# Patient Record
Sex: Female | Born: 1935
Health system: Southern US, Community
[De-identification: ages and names within clinical notes are randomized; demographics above are authoritative.]

## PROBLEM LIST (undated history)

## (undated) DIAGNOSIS — I499 Cardiac arrhythmia, unspecified: Secondary | ICD-10-CM

## (undated) DIAGNOSIS — M1990 Unspecified osteoarthritis, unspecified site: Secondary | ICD-10-CM

## (undated) DIAGNOSIS — N816 Rectocele: Secondary | ICD-10-CM

## (undated) DIAGNOSIS — I739 Peripheral vascular disease, unspecified: Secondary | ICD-10-CM

## (undated) DIAGNOSIS — I4891 Unspecified atrial fibrillation: Secondary | ICD-10-CM

## (undated) DIAGNOSIS — I2510 Atherosclerotic heart disease of native coronary artery without angina pectoris: Secondary | ICD-10-CM

## (undated) DIAGNOSIS — F419 Anxiety disorder, unspecified: Secondary | ICD-10-CM

## (undated) DIAGNOSIS — K219 Gastro-esophageal reflux disease without esophagitis: Secondary | ICD-10-CM

## (undated) DIAGNOSIS — R0600 Dyspnea, unspecified: Secondary | ICD-10-CM

## (undated) DIAGNOSIS — Z87442 Personal history of urinary calculi: Secondary | ICD-10-CM

## (undated) DIAGNOSIS — I10 Essential (primary) hypertension: Secondary | ICD-10-CM

## (undated) DIAGNOSIS — E785 Hyperlipidemia, unspecified: Secondary | ICD-10-CM

## (undated) DIAGNOSIS — N189 Chronic kidney disease, unspecified: Secondary | ICD-10-CM

## (undated) DIAGNOSIS — R011 Cardiac murmur, unspecified: Secondary | ICD-10-CM

## (undated) DIAGNOSIS — N390 Urinary tract infection, site not specified: Secondary | ICD-10-CM

## (undated) HISTORY — DX: Urinary tract infection, site not specified: N39.0

## (undated) HISTORY — DX: Unspecified atrial fibrillation: I48.91

## (undated) HISTORY — PX: EYE SURGERY: SHX253

## (undated) HISTORY — DX: Essential (primary) hypertension: I10

## (undated) HISTORY — PX: ABDOMINAL AORTA STENT: SHX1108

## (undated) HISTORY — PX: JOINT REPLACEMENT: SHX530

## (undated) HISTORY — DX: Atherosclerotic heart disease of native coronary artery without angina pectoris: I25.10

## (undated) HISTORY — PX: CORONARY ANGIOPLASTY WITH STENT PLACEMENT: SHX49

## (undated) HISTORY — PX: RENAL ARTERY STENT: SHX2321

## (undated) HISTORY — PX: CARDIAC CATHETERIZATION: SHX172

## (undated) HISTORY — PX: BREAST BIOPSY: SHX20

## (undated) HISTORY — PX: BREAST EXCISIONAL BIOPSY: SUR124

## (undated) HISTORY — DX: Hyperlipidemia, unspecified: E78.5

## (undated) HISTORY — PX: CORONARY ARTERY BYPASS GRAFT: SHX141

## (undated) HISTORY — PX: TONSILLECTOMY: SUR1361

## (undated) HISTORY — DX: Peripheral vascular disease, unspecified: I73.9

## (undated) HISTORY — PX: APPENDECTOMY: SHX54

---

## 1993-09-17 HISTORY — PX: CORONARY ARTERY BYPASS GRAFT: SHX141

## 1998-11-07 ENCOUNTER — Inpatient Hospital Stay (HOSPITAL_COMMUNITY): Admission: EM | Admit: 1998-11-07 | Discharge: 1998-11-10 | Payer: Self-pay | Admitting: Emergency Medicine

## 2005-02-26 ENCOUNTER — Ambulatory Visit: Payer: Self-pay | Admitting: Internal Medicine

## 2005-04-30 ENCOUNTER — Ambulatory Visit: Payer: Self-pay | Admitting: Internal Medicine

## 2005-06-26 ENCOUNTER — Ambulatory Visit: Payer: Self-pay | Admitting: Vascular Surgery

## 2006-04-26 ENCOUNTER — Ambulatory Visit: Payer: Self-pay | Admitting: Family Medicine

## 2006-08-29 ENCOUNTER — Ambulatory Visit: Payer: Self-pay

## 2006-09-03 ENCOUNTER — Ambulatory Visit: Payer: Self-pay

## 2006-09-17 HISTORY — PX: BREAST BIOPSY: SHX20

## 2006-10-02 ENCOUNTER — Ambulatory Visit: Payer: Self-pay | Admitting: Unknown Physician Specialty

## 2006-12-09 ENCOUNTER — Ambulatory Visit: Payer: Self-pay | Admitting: Surgery

## 2006-12-31 ENCOUNTER — Ambulatory Visit: Payer: Self-pay | Admitting: Surgery

## 2006-12-31 ENCOUNTER — Other Ambulatory Visit: Payer: Self-pay

## 2007-01-07 ENCOUNTER — Ambulatory Visit: Payer: Self-pay | Admitting: Surgery

## 2007-04-28 ENCOUNTER — Ambulatory Visit: Payer: Self-pay | Admitting: Internal Medicine

## 2007-12-03 ENCOUNTER — Ambulatory Visit: Payer: Self-pay

## 2008-11-16 ENCOUNTER — Ambulatory Visit: Payer: Self-pay | Admitting: Internal Medicine

## 2009-02-23 ENCOUNTER — Ambulatory Visit: Payer: Self-pay

## 2010-04-26 ENCOUNTER — Ambulatory Visit: Payer: Self-pay | Admitting: Unknown Physician Specialty

## 2010-10-19 ENCOUNTER — Ambulatory Visit: Payer: Self-pay | Admitting: Obstetrics and Gynecology

## 2011-10-07 ENCOUNTER — Inpatient Hospital Stay: Payer: Self-pay | Admitting: Internal Medicine

## 2011-10-07 LAB — CBC
MCH: 30.1 pg (ref 26.0–34.0)
MCHC: 33.4 g/dL (ref 32.0–36.0)
Platelet: 164 10*3/uL (ref 150–440)
RDW: 12.4 % (ref 11.5–14.5)
WBC: 5.8 10*3/uL (ref 3.6–11.0)

## 2011-10-07 LAB — COMPREHENSIVE METABOLIC PANEL
Alkaline Phosphatase: 46 U/L — ABNORMAL LOW (ref 50–136)
Anion Gap: 11 (ref 7–16)
BUN: 26 mg/dL — ABNORMAL HIGH (ref 7–18)
Calcium, Total: 10.1 mg/dL (ref 8.5–10.1)
Chloride: 104 mmol/L (ref 98–107)
EGFR (African American): >60
Glucose: 109 mg/dL — ABNORMAL HIGH (ref 65–99)
Osmolality: 288 (ref 275–301)
Potassium: 4.1 mmol/L (ref 3.5–5.1)
SGOT(AST): 26 U/L (ref 15–37)
SGPT (ALT): 26 U/L
Sodium: 142 mmol/L (ref 136–145)
Total Protein: 7.6 g/dL (ref 6.4–8.2)

## 2011-10-07 LAB — PROTIME-INR: Prothrombin Time: 12.3 secs (ref 11.5–14.7)

## 2011-10-07 LAB — CK TOTAL AND CKMB (NOT AT ARMC)
CK, Total: 42 U/L (ref 21–215)
CK, Total: 35 U/L (ref 21–215)

## 2011-10-07 LAB — TROPONIN I
Troponin-I: <0.02 ng/mL
Troponin-I: <0.02 ng/mL

## 2011-10-07 LAB — MAGNESIUM: Magnesium: 1.7 mg/dL — ABNORMAL LOW

## 2011-10-08 LAB — TROPONIN I: Troponin-I: <0.02 ng/mL

## 2011-10-08 LAB — CBC WITH DIFFERENTIAL/PLATELET
Basophil #: 0 10*3/uL (ref 0.0–0.1)
Basophil %: 0.3 %
Eosinophil #: 0.1 10*3/uL (ref 0.0–0.7)
Eosinophil %: 1.7 %
HCT: 33.2 % — ABNORMAL LOW (ref 35.0–47.0)
HGB: 11.3 g/dL — ABNORMAL LOW (ref 12.0–16.0)
Lymphocyte #: 2 10*3/uL (ref 1.0–3.6)
Lymphocyte %: 36.5 %
MCHC: 34 g/dL (ref 32.0–36.0)
MCV: 90 fL (ref 80–100)
Monocyte %: 8 %
Neutrophil #: 2.9 10*3/uL (ref 1.4–6.5)
RBC: 3.7 10*6/uL — ABNORMAL LOW (ref 3.80–5.20)

## 2011-10-08 LAB — LIPID PANEL
Cholesterol: 141 mg/dL (ref 0–200)
Ldl Cholesterol, Calc: 68 mg/dL (ref 0–100)
VLDL Cholesterol, Calc: 36 mg/dL (ref 5–40)

## 2011-10-08 LAB — BASIC METABOLIC PANEL
BUN: 20 mg/dL — ABNORMAL HIGH (ref 7–18)
Calcium, Total: 9.1 mg/dL (ref 8.5–10.1)
EGFR (African American): >60
EGFR (Non-African Amer.): >60
Glucose: 111 mg/dL — ABNORMAL HIGH (ref 65–99)
Osmolality: 290 (ref 275–301)
Potassium: 4.4 mmol/L (ref 3.5–5.1)

## 2011-10-09 LAB — BASIC METABOLIC PANEL
Anion Gap: 12 (ref 7–16)
Calcium, Total: 8.7 mg/dL (ref 8.5–10.1)
Co2: 27 mmol/L (ref 21–32)
EGFR (African American): >60
Glucose: 102 mg/dL — ABNORMAL HIGH (ref 65–99)
Osmolality: 281 (ref 275–301)
Potassium: 4 mmol/L (ref 3.5–5.1)

## 2011-10-09 LAB — CK TOTAL AND CKMB (NOT AT ARMC): CK-MB: 0.5 ng/mL (ref 0.5–3.6)

## 2011-12-31 ENCOUNTER — Institutional Professional Consult (permissible substitution): Payer: Self-pay | Admitting: Cardiovascular Disease

## 2012-01-10 ENCOUNTER — Encounter: Payer: Self-pay | Admitting: *Deleted

## 2012-01-14 ENCOUNTER — Encounter: Payer: Self-pay | Admitting: Cardiovascular Disease

## 2012-01-17 ENCOUNTER — Encounter: Payer: Self-pay | Admitting: *Deleted

## 2012-01-18 ENCOUNTER — Encounter: Payer: Self-pay | Admitting: *Deleted

## 2012-01-22 ENCOUNTER — Encounter: Payer: Self-pay | Admitting: Cardiovascular Disease

## 2012-01-22 ENCOUNTER — Ambulatory Visit (INDEPENDENT_AMBULATORY_CARE_PROVIDER_SITE_OTHER): Payer: Medicare Other | Admitting: Cardiovascular Disease

## 2012-01-22 VITALS — BP 145/82 | HR 55 | Ht 63.0 in | Wt 140.8 lb

## 2012-01-22 DIAGNOSIS — E1169 Type 2 diabetes mellitus with other specified complication: Secondary | ICD-10-CM | POA: Insufficient documentation

## 2012-01-22 DIAGNOSIS — I701 Atherosclerosis of renal artery: Secondary | ICD-10-CM | POA: Insufficient documentation

## 2012-01-22 DIAGNOSIS — E785 Hyperlipidemia, unspecified: Secondary | ICD-10-CM | POA: Insufficient documentation

## 2012-01-22 DIAGNOSIS — I259 Chronic ischemic heart disease, unspecified: Secondary | ICD-10-CM

## 2012-01-22 DIAGNOSIS — Z951 Presence of aortocoronary bypass graft: Secondary | ICD-10-CM

## 2012-01-22 DIAGNOSIS — E119 Type 2 diabetes mellitus without complications: Secondary | ICD-10-CM

## 2012-01-22 DIAGNOSIS — E1122 Type 2 diabetes mellitus with diabetic chronic kidney disease: Secondary | ICD-10-CM | POA: Insufficient documentation

## 2012-01-22 DIAGNOSIS — E782 Mixed hyperlipidemia: Secondary | ICD-10-CM | POA: Insufficient documentation

## 2012-01-22 DIAGNOSIS — I10 Essential (primary) hypertension: Secondary | ICD-10-CM | POA: Insufficient documentation

## 2012-01-22 DIAGNOSIS — N182 Chronic kidney disease, stage 2 (mild): Secondary | ICD-10-CM | POA: Insufficient documentation

## 2012-01-22 DIAGNOSIS — I2510 Atherosclerotic heart disease of native coronary artery without angina pectoris: Secondary | ICD-10-CM | POA: Insufficient documentation

## 2012-01-22 MED ORDER — NITROGLYCERIN 0.4 MG SL SUBL: 0.4000 mg | SUBLINGUAL_TABLET | SUBLINGUAL | Status: DC | PRN

## 2012-01-22 NOTE — Progress Notes (Signed)
Patient ID: Tracey [name], female    DOB: 1936/06/17, 76 y.o.   MRN: [id number]  HPI Comments: [name] is a very pleasant 76 year old woman who is new to our clinic with a history of coronary artery disease, bypass surgery in 1995, diabetes with most recent hemoglobin A1c 7.0 number stent to her graft to the OM several years ago with chest pain January 2013 and repeat stenting to the graft to the OM with a Xience 3.5 x 18 mm stent, also with left renal PCI in 1998, redo PCI March 2010 followed by Dr. Wyn Quaker, who presents to establish care.  She reports that she has had no further chest pain since her stent to the vein graft in January 2013. She is very active and takes care of a large family. She reports that on recent ultrasound of her renal artery, she had no significant restenosis.  Most recent total cholesterol 141, LDL 68. She is working on her diabetes trying to get hemoglobin A1c less than 7. She watches what she eats. She denies any significant carotid arterial disease.  EKG shows sinus bradycardia with rate 55 beats per minute with no significant ST or T wave changes  Details from her bypass surgery showed LIMA to the LAD, vein graft to the PDA and vein graft to the OM Details from the cardiac catheterization January 2013 show proximal LAD occlusion, proximal left circumflex occlusion, proximal RCA occlusion, graft to the OM with 90% disease proximally. DES stent was placed.  Echocardiogram dated January 2012 shows normal systolic function, mild MR and TR   Outpatient Encounter Prescriptions as of 01/22/2012  Medication Sig Dispense Refill  . aspirin 81 MG tablet Take 81 mg by mouth daily.      . Calcium Carbonate-Vit D-Min (CALTRATE 600+D PLUS PO) Take 200 Units by mouth daily.      . clopidogrel (PLAVIX) 75 MG tablet Take 75 mg by mouth daily.      Marland Kitchen lisinopril (PRINIVIL,ZESTRIL) 20 MG tablet Take 20 mg by mouth daily.      . metFORMIN (GLUCOPHAGE) 500 MG tablet Take 500 mg by mouth  2 (two) times daily with a meal.      . metoprolol tartrate (LOPRESSOR) 25 MG tablet Take 25 mg by mouth daily.      . Multiple Vitamins-Minerals (MULTIVITAMIN WITH MINERALS) tablet Take 1 tablet by mouth daily.      . rosuvastatin (CRESTOR) 20 MG tablet Take 20 mg by mouth daily.      . nitroGLYCERIN (NITROSTAT) 0.4 MG SL tablet Place 1 tablet (0.4 mg total) under the tongue every 5 (five) minutes as needed for chest pain.  25 tablet  6   Review of Systems  Constitutional: Negative.   HENT: Negative.   Eyes: Negative.   Respiratory: Negative.   Cardiovascular: Negative.   Gastrointestinal: Negative.   Musculoskeletal: Negative.   Skin: Negative.   Neurological: Negative.   Hematological: Negative.   Psychiatric/Behavioral: Negative.   All other systems reviewed and are negative.    BP 145/82  Pulse 55  Ht 5\' 3"  (1.6 m)  Wt 140 lb 12 oz (63.844 kg)  BMI 24.93 kg/m2  Physical Exam  Nursing note and vitals reviewed. Constitutional: She is oriented to person, place, and time. She appears well-developed and well-nourished.  HENT:  Head: Normocephalic.  Nose: Nose normal.  Mouth/Throat: Oropharynx is clear and moist.  Eyes: Conjunctivae are normal. Pupils are equal, round, and reactive to light.  Neck: Normal range of  motion. Neck supple. No JVD present.  Cardiovascular: Normal rate, regular rhythm, S1 normal, S2 normal, normal heart sounds and intact distal pulses.  Exam reveals no gallop and no friction rub.   No murmur heard. Pulmonary/Chest: Effort normal and breath sounds normal. No respiratory distress. She has no wheezes. She has no rales. She exhibits no tenderness.  Abdominal: Soft. Bowel sounds are normal. She exhibits no distension. There is no tenderness.  Musculoskeletal: Normal range of motion. She exhibits no edema and no tenderness.  Lymphadenopathy:    She has no cervical adenopathy.  Neurological: She is alert and oriented to person, place, and time.  Coordination normal.  Skin: Skin is warm and dry. No rash noted. No erythema.  Psychiatric: She has a normal mood and affect. Her behavior is normal. Judgment and thought content normal.         Assessment and Plan

## 2012-01-22 NOTE — Assessment & Plan Note (Signed)
Cholesterol is at goal on the current lipid regimen. No changes to the medications were made.  

## 2012-01-22 NOTE — Assessment & Plan Note (Signed)
Followed by Dr. Wyn Quaker. Previous PCI on the left.

## 2012-01-22 NOTE — Assessment & Plan Note (Signed)
Recent PCI of the vein graft to the OM. Currently with no symptoms. DES stent placed. Will continue Plavix and aspirin for at least one year extending to early 2014. No further testing at this time

## 2012-01-22 NOTE — Patient Instructions (Addendum)
You are doing well. Please increase aspirin to 81 mg x 2 daily with plavix  Please call us if you have new issues that need to be addressed before your next appt.  Your physician wants you to follow-up in: 6 months.  You will receive a reminder letter in the mail two months in advance. If you don't receive a letter, please call our office to schedule the follow-up appointment.

## 2012-01-22 NOTE — Assessment & Plan Note (Signed)
We have encouraged her to work on her cholesterol with goal hemoglobin A1c less than 6.5.

## 2012-01-22 NOTE — Assessment & Plan Note (Signed)
Blood pressure is well controlled on today's visit. No changes made to the medications. 

## 2012-07-10 ENCOUNTER — Ambulatory Visit: Payer: Self-pay | Admitting: Unknown Physician Specialty

## 2012-07-15 ENCOUNTER — Ambulatory Visit: Payer: Self-pay | Admitting: Family Medicine

## 2012-07-21 ENCOUNTER — Encounter: Payer: Self-pay | Admitting: Cardiovascular Disease

## 2012-07-21 ENCOUNTER — Ambulatory Visit (INDEPENDENT_AMBULATORY_CARE_PROVIDER_SITE_OTHER): Payer: Medicare Other | Admitting: Cardiovascular Disease

## 2012-07-21 VITALS — BP 128/58 | HR 56 | Ht 63.0 in | Wt 137.2 lb

## 2012-07-21 DIAGNOSIS — E119 Type 2 diabetes mellitus without complications: Secondary | ICD-10-CM

## 2012-07-21 DIAGNOSIS — I2510 Atherosclerotic heart disease of native coronary artery without angina pectoris: Secondary | ICD-10-CM

## 2012-07-21 DIAGNOSIS — I259 Chronic ischemic heart disease, unspecified: Secondary | ICD-10-CM

## 2012-07-21 DIAGNOSIS — Z951 Presence of aortocoronary bypass graft: Secondary | ICD-10-CM

## 2012-07-21 DIAGNOSIS — I10 Essential (primary) hypertension: Secondary | ICD-10-CM

## 2012-07-21 DIAGNOSIS — E785 Hyperlipidemia, unspecified: Secondary | ICD-10-CM

## 2012-07-21 DIAGNOSIS — I701 Atherosclerosis of renal artery: Secondary | ICD-10-CM

## 2012-07-21 MED ORDER — ROSUVASTATIN CALCIUM 40 MG PO TABS: 40.0000 mg | ORAL_TABLET | Freq: Every day | ORAL | Status: DC

## 2012-07-21 NOTE — Assessment & Plan Note (Signed)
Previous ultrasound showing no stenosis.

## 2012-07-21 NOTE — Assessment & Plan Note (Signed)
Cholesterol is at goal on the current lipid regimen. No changes to the medications were made.  

## 2012-07-21 NOTE — Assessment & Plan Note (Signed)
Currently with no symptoms of angina. No further workup at this time. Continue current medication regimen. 

## 2012-07-21 NOTE — Progress Notes (Signed)
Patient ID: Tracey [name], female    DOB: 11-Oct-1935, 76 y.o.   MRN: [id number]  HPI Comments: [name] is a very pleasant 76 year old woman  with a history of coronary artery disease, bypass surgery in 1995, diabetes with most recent hemoglobin A1c 7.0,  stent to her graft to the OM several years ago with chest pain January 2013 and repeat stenting to the graft to the OM with a Xience 3.5 x 18 mm stent, also with left renal PCI in 1998, redo PCI March 2010 followed by Dr. Wyn Quaker, who presents for routine followup  She reports that she has had no further chest pain since her stent to the vein graft in January 2013. She is very active and takes care of a large family.  Most recent total cholesterol 141, LDL 68. She is working on her diabetes trying to get hemoglobin A1c less than 7. She watches what she eats. She denies any significant carotid arterial disease.  She has decreased her metoprolol to 12.5 mg twice a day for slow heart rate. She is scheduled to have knee surgery early next year with Dr. Gavin Potters.   Details from her bypass surgery showed LIMA to the LAD, vein graft to the PDA and vein graft to the OM Details from the cardiac catheterization January 2013 show proximal LAD occlusion, proximal left circumflex occlusion, proximal RCA occlusion, graft to the OM with 90% disease proximally. DES stent was placed.  Echocardiogram dated January 2012 shows normal systolic function, mild MR and TR  EKG shows sinus bradycardia with rate 54 beats per minute with no significant ST or T wave changes   Outpatient Encounter Prescriptions as of 07/21/2012  Medication Sig Dispense Refill  . aspirin 81 MG tablet Take 81 mg by mouth 2 (two) times daily.      . Calcium Carbonate-Vit D-Min (CALTRATE 600+D PLUS PO) Take 200 Units by mouth daily.      . clopidogrel (PLAVIX) 75 MG tablet Take 75 mg by mouth daily.      Marland Kitchen lisinopril (PRINIVIL,ZESTRIL) 20 MG tablet Take 20 mg by mouth daily.      .  metFORMIN (GLUCOPHAGE) 500 MG tablet Take 500 mg by mouth 2 (two) times daily with a meal.      . metoprolol tartrate (LOPRESSOR) 25 MG tablet Take 25 mg by mouth daily.      . Multiple Vitamins-Minerals (MULTIVITAMIN WITH MINERALS) tablet Take 1 tablet by mouth daily.      . nitroGLYCERIN (NITROSTAT) 0.4 MG SL tablet Place 1 tablet (0.4 mg total) under the tongue every 5 (five) minutes as needed for chest pain.  25 tablet  6  . rosuvastatin (CRESTOR) 40 MG tablet Take 1 tablet (40 mg total) by mouth daily.  30 tablet  6  . [DISCONTINUED] aspirin 81 MG tablet Take 162 mg by mouth daily.       . [DISCONTINUED] rosuvastatin (CRESTOR) 20 MG tablet Take 20 mg by mouth daily.       Review of Systems  Constitutional: Negative.   HENT: Negative.   Eyes: Negative.   Respiratory: Negative.   Cardiovascular: Negative.   Gastrointestinal: Negative.   Musculoskeletal: Positive for joint swelling and arthralgias.  Skin: Negative.   Neurological: Negative.   Hematological: Negative.   Psychiatric/Behavioral: Negative.   All other systems reviewed and are negative.    BP 128/58  Pulse 56  Ht 5\' 3"  (1.6 m)  Wt 137 lb 4 oz (62.256 kg)  BMI 24.31 kg/m2  Physical Exam  Nursing note and vitals reviewed. Constitutional: She is oriented to person, place, and time. She appears well-developed and well-nourished.  HENT:  Head: Normocephalic.  Nose: Nose normal.  Mouth/Throat: Oropharynx is clear and moist.  Eyes: Conjunctivae normal are normal. Pupils are equal, round, and reactive to light.  Neck: Normal range of motion. Neck supple. No JVD present.  Cardiovascular: Normal rate, regular rhythm, S1 normal, S2 normal, normal heart sounds and intact distal pulses.  Exam reveals no gallop and no friction rub.   No murmur heard. Pulmonary/Chest: Effort normal and breath sounds normal. No respiratory distress. She has no wheezes. She has no rales. She exhibits no tenderness.  Abdominal: Soft. Bowel  sounds are normal. She exhibits no distension. There is no tenderness.  Musculoskeletal: Normal range of motion. She exhibits no edema and no tenderness.  Lymphadenopathy:    She has no cervical adenopathy.  Neurological: She is alert and oriented to person, place, and time. Coordination normal.  Skin: Skin is warm and dry. No rash noted. No erythema.  Psychiatric: She has a normal mood and affect. Her behavior is normal. Judgment and thought content normal.         Assessment and Plan

## 2012-07-21 NOTE — Assessment & Plan Note (Signed)
She is working on her diet and exercise

## 2012-07-21 NOTE — Assessment & Plan Note (Signed)
Blood pressure is well controlled on today's visit. No changes made to the medications. 

## 2012-07-21 NOTE — Patient Instructions (Addendum)
You are doing well. No medication changes were made.  Please call us if you have new issues that need to be addressed before your next appt.  Your physician wants you to follow-up in: 6 months.  You will receive a reminder letter in the mail two months in advance. If you don't receive a letter, please call our office to schedule the follow-up appointment.   

## 2012-11-13 ENCOUNTER — Other Ambulatory Visit: Payer: Self-pay

## 2012-11-13 MED ORDER — METOPROLOL TARTRATE 25 MG PO TABS: 25.0000 mg | ORAL_TABLET | Freq: Every day | ORAL | Status: DC

## 2012-11-13 NOTE — Telephone Encounter (Signed)
Refill sent for Toprol XL 25 mg

## 2012-11-19 ENCOUNTER — Other Ambulatory Visit: Payer: Self-pay

## 2012-11-19 ENCOUNTER — Telehealth: Payer: Self-pay

## 2012-11-19 NOTE — Telephone Encounter (Signed)
Pharmacy calling with a change in the patient's metoprolol; states she has been getting the ER version and would like to verify. Please advise if okay to take ER or should patient be on the metoprolol tartrate? She only takes 25 mg once a day.

## 2012-11-19 NOTE — Telephone Encounter (Signed)
Pharmacy does not open until 09:00. Will call back.

## 2012-11-19 NOTE — Telephone Encounter (Signed)
Spoke with Chrissy at [facility] regarding the metoprolol change. The ER that she is taking is okay to take since that is what she had been taking in the past. The patient will now take the metoprolol succ ER 25 mg one tablet daily.

## 2012-12-29 ENCOUNTER — Other Ambulatory Visit: Payer: Self-pay

## 2012-12-29 LAB — SYNOVIAL CELL COUNT + DIFF, W/ CRYSTALS
Lymphocytes: 4 %
Nucleated Cell Count: 41406 /mm3

## 2013-01-22 ENCOUNTER — Other Ambulatory Visit: Payer: Self-pay | Admitting: *Deleted

## 2013-01-22 MED ORDER — CLOPIDOGREL BISULFATE 75 MG PO TABS: 75.0000 mg | ORAL_TABLET | Freq: Every day | ORAL | Status: DC

## 2013-01-22 NOTE — Telephone Encounter (Signed)
Refill sent for Plavix #30 refill#1 sent to [facility] pharmacy.

## 2013-02-03 ENCOUNTER — Ambulatory Visit: Payer: Medicare Other | Admitting: Cardiovascular Disease

## 2013-02-20 ENCOUNTER — Ambulatory Visit (INDEPENDENT_AMBULATORY_CARE_PROVIDER_SITE_OTHER): Payer: Medicare Other | Admitting: Cardiovascular Disease

## 2013-02-20 ENCOUNTER — Encounter: Payer: Self-pay | Admitting: Cardiovascular Disease

## 2013-02-20 VITALS — BP 126/62 | HR 56 | Ht 63.0 in | Wt 130.5 lb

## 2013-02-20 DIAGNOSIS — E119 Type 2 diabetes mellitus without complications: Secondary | ICD-10-CM

## 2013-02-20 DIAGNOSIS — I2510 Atherosclerotic heart disease of native coronary artery without angina pectoris: Secondary | ICD-10-CM

## 2013-02-20 DIAGNOSIS — Z01810 Encounter for preprocedural cardiovascular examination: Secondary | ICD-10-CM

## 2013-02-20 DIAGNOSIS — R0602 Shortness of breath: Secondary | ICD-10-CM

## 2013-02-20 DIAGNOSIS — E785 Hyperlipidemia, unspecified: Secondary | ICD-10-CM

## 2013-02-20 DIAGNOSIS — I259 Chronic ischemic heart disease, unspecified: Secondary | ICD-10-CM

## 2013-02-20 NOTE — Assessment & Plan Note (Signed)
Currently with no symptoms of angina. No further workup at this time. Continue current medication regimen. 

## 2013-02-20 NOTE — Assessment & Plan Note (Addendum)
No further testing needed prior to her total knee replacement surgery. Acceptable risk.  She can stop her Plavix 5 days prior to the surgery. If alternate anticoagulation needed after total knee replacement (ie xarelto/warfarin), this could be done for several weeks and Plavix could be restarted with low-dose aspirin after this has been completed.

## 2013-02-20 NOTE — Patient Instructions (Addendum)
You are doing well. No medication changes were made.  Please call us if you have new issues that need to be addressed before your next appt.  Your physician wants you to follow-up in: 6 months.  You will receive a reminder letter in the mail two months in advance. If you don't receive a letter, please call our office to schedule the follow-up appointment.   

## 2013-02-20 NOTE — Progress Notes (Signed)
Patient ID: Tracey [name], female    DOB: 11/13/1935, 77 y.o.   MRN: [id number]  HPI Comments: [name] is a very pleasant 77 year old woman  with a history of coronary artery disease, bypass surgery in 1995, diabetes with most recent hemoglobin A1c 7.0,  stent to her graft to the OM several years ago with chest pain January 2013 and repeat stenting to the graft to the OM with a Xience 3.5 x 18 mm stent, also with left renal PCI in 1998, redo PCI March 2010 followed by Dr. Wyn Quaker, who presents for routine followup  She reports that she has had no further chest pain since her stent to the vein graft in January 2013. She is very active and takes care of a large family.  Very active at baseline with no complaints Cholesterol has always been at goal hemoglobin A1c still 7. She watches what she eats. Slowly losing weight Schedule for total knee replacement surgery in the near future with Dr. Ernest Pine  She has decreased her metoprolol to 12.5 mg twice a day for slow heart rate. She is scheduled to have knee surgery early next year with Dr. Gavin Potters.   Details from her bypass surgery showed LIMA to the LAD, vein graft to the PDA and vein graft to the OM Details from the cardiac catheterization January 2013 show proximal LAD occlusion, proximal left circumflex occlusion, proximal RCA occlusion, graft to the OM with 90% disease proximally. DES stent was placed.  Echocardiogram dated January 2012 shows normal systolic function, mild MR and TR  EKG shows sinus bradycardia with rate 56 beats per minute with no significant ST or T wave changes   Outpatient Encounter Prescriptions as of 02/20/2013  Medication Sig Dispense Refill  . aspirin 81 MG tablet Take 81 mg by mouth 2 (two) times daily.      . Calcium Carbonate-Vit D-Min (CALTRATE 600+D PLUS PO) Take 200 Units by mouth daily.      . clopidogrel (PLAVIX) 75 MG tablet Take 1 tablet (75 mg total) by mouth daily.  30 tablet  1  . lisinopril  (PRINIVIL,ZESTRIL) 20 MG tablet Take 20 mg by mouth daily.      . metFORMIN (GLUCOPHAGE) 500 MG tablet Take 500 mg by mouth 2 (two) times daily with a meal.      . metoprolol succinate (TOPROL-XL) 25 MG 24 hr tablet Take 1 tablet (25 mg total) by mouth daily.  90 tablet  3  . Multiple Vitamins-Minerals (MULTIVITAMIN WITH MINERALS) tablet Take 1 tablet by mouth daily.      . nitroGLYCERIN (NITROSTAT) 0.4 MG SL tablet Place 1 tablet (0.4 mg total) under the tongue every 5 (five) minutes as needed for chest pain.  25 tablet  6  . rosuvastatin (CRESTOR) 40 MG tablet Take 1 tablet (40 mg total) by mouth daily.  30 tablet  6  . traMADol (ULTRAM) 50 MG tablet Take 50 mg by mouth every 6 (six) hours as needed.        Review of Systems  Constitutional: Negative.   HENT: Negative.   Eyes: Negative.   Respiratory: Negative.   Cardiovascular: Negative.   Gastrointestinal: Negative.   Musculoskeletal: Positive for joint swelling and arthralgias.  Skin: Negative.   Neurological: Negative.   Psychiatric/Behavioral: Negative.   All other systems reviewed and are negative.    BP 126/62  Pulse 56  Ht 5\' 3"  (1.6 m)  Wt 130 lb 8 oz (59.194 kg)  BMI 23.12 kg/m2  Physical  Exam  Nursing note and vitals reviewed. Constitutional: She is oriented to person, place, and time. She appears well-developed and well-nourished.  HENT:  Head: Normocephalic.  Nose: Nose normal.  Mouth/Throat: Oropharynx is clear and moist.  Eyes: Conjunctivae are normal. Pupils are equal, round, and reactive to light.  Neck: Normal range of motion. Neck supple. No JVD present.  Cardiovascular: Normal rate, regular rhythm, S1 normal, S2 normal, normal heart sounds and intact distal pulses.  Exam reveals no gallop and no friction rub.   No murmur heard. Pulmonary/Chest: Effort normal and breath sounds normal. No respiratory distress. She has no wheezes. She has no rales. She exhibits no tenderness.  Abdominal: Soft. Bowel sounds  are normal. She exhibits no distension. There is no tenderness.  Musculoskeletal: Normal range of motion. She exhibits no edema and no tenderness.  Lymphadenopathy:    She has no cervical adenopathy.  Neurological: She is alert and oriented to person, place, and time. Coordination normal.  Skin: Skin is warm and dry. No rash noted. No erythema.  Psychiatric: She has a normal mood and affect. Her behavior is normal. Judgment and thought content normal.    Assessment and Plan

## 2013-02-20 NOTE — Assessment & Plan Note (Signed)
She'll continue to work on her diet but has not had much weight to lose. Hemoglobin A1c of 7.2. May need slightly higher dose metformin. Will defer to Dr. Terance Hart

## 2013-02-20 NOTE — Assessment & Plan Note (Signed)
Cholesterol is at goal on the current lipid regimen. No changes to the medications were made.  

## 2013-03-09 ENCOUNTER — Telehealth: Payer: Self-pay

## 2013-03-09 ENCOUNTER — Ambulatory Visit: Payer: Self-pay | Admitting: General Practice

## 2013-03-09 LAB — BASIC METABOLIC PANEL
Anion Gap: 7 (ref 7–16)
BUN: 21 mg/dL — ABNORMAL HIGH (ref 7–18)
Calcium, Total: 10.2 mg/dL — ABNORMAL HIGH (ref 8.5–10.1)
Chloride: 106 mmol/L (ref 98–107)
Co2: 31 mmol/L (ref 21–32)
Creatinine: 0.85 mg/dL (ref 0.60–1.30)
EGFR (African American): >60
Glucose: 163 mg/dL — ABNORMAL HIGH (ref 65–99)
Potassium: 4.3 mmol/L (ref 3.5–5.1)
Sodium: 144 mmol/L (ref 136–145)

## 2013-03-09 LAB — URINALYSIS, COMPLETE
Bilirubin,UR: NEGATIVE
Blood: NEGATIVE
Glucose,UR: NEGATIVE mg/dL (ref 0–75)
Ketone: NEGATIVE
Leukocyte Esterase: NEGATIVE
Nitrite: NEGATIVE
Ph: 5 (ref 4.5–8.0)
RBC,UR: 2 /HPF (ref 0–5)
Specific Gravity: 1.021 (ref 1.003–1.030)
Squamous Epithelial: <1

## 2013-03-09 LAB — PROTIME-INR
INR: 1
Prothrombin Time: 13.3 secs (ref 11.5–14.7)

## 2013-03-09 LAB — MRSA PCR SCREENING

## 2013-03-09 LAB — CBC
HGB: 12.1 g/dL (ref 12.0–16.0)
MCH: 30.2 pg (ref 26.0–34.0)
MCHC: 34.7 g/dL (ref 32.0–36.0)
MCV: 87 fL (ref 80–100)
Platelet: 181 10*3/uL (ref 150–440)
RBC: 4.02 10*6/uL (ref 3.80–5.20)

## 2013-03-09 LAB — APTT: Activated PTT: 29.8 secs (ref 23.6–35.9)

## 2013-03-09 NOTE — Telephone Encounter (Signed)
Dr Ernest Pine needs cardiac clearance for total knee replacement r knee scheduled for July 9. PLease advise on aspirin and Plavix. Fax [phone]

## 2013-03-09 NOTE — Telephone Encounter (Signed)
Letter faxed.

## 2013-03-11 NOTE — Telephone Encounter (Signed)
Would not stop aspirin, only plavix for short period

## 2013-03-11 NOTE — Telephone Encounter (Signed)
Ok to hold ASA as well prior to surg?

## 2013-03-11 NOTE — Telephone Encounter (Signed)
Dr Ernest Pine office called asking about holding pt aspirin. Please advise

## 2013-03-12 NOTE — Telephone Encounter (Signed)
Elaine informed.

## 2013-03-19 ENCOUNTER — Telehealth: Payer: Self-pay

## 2013-03-19 NOTE — Telephone Encounter (Signed)
Pt is having knee replacement next Wed 03/25/13, pt was told to come off Plavix 5 days before, will she be ok to stop this? Please call

## 2013-03-19 NOTE — Telephone Encounter (Signed)
Pt reassured, per letter we faxed 03/09/13, ok to hold plavix x 5 days prior to surg Understanding verb

## 2013-03-25 ENCOUNTER — Inpatient Hospital Stay: Payer: Self-pay | Admitting: General Practice

## 2013-03-25 HISTORY — PX: REPLACEMENT TOTAL KNEE: SUR1224

## 2013-03-26 LAB — BASIC METABOLIC PANEL
Anion Gap: 2 — ABNORMAL LOW (ref 7–16)
BUN: 15 mg/dL (ref 7–18)
Calcium, Total: 8.6 mg/dL (ref 8.5–10.1)
Chloride: 111 mmol/L — ABNORMAL HIGH (ref 98–107)
Co2: 29 mmol/L (ref 21–32)
Creatinine: 0.85 mg/dL (ref 0.60–1.30)
EGFR (African American): >60
Osmolality: 285 (ref 275–301)
Sodium: 142 mmol/L (ref 136–145)

## 2013-03-26 LAB — PLATELET COUNT: Platelet: 113 10*3/uL — ABNORMAL LOW (ref 150–440)

## 2013-03-27 LAB — BASIC METABOLIC PANEL
BUN: 10 mg/dL (ref 7–18)
Chloride: 103 mmol/L (ref 98–107)
Co2: 27 mmol/L (ref 21–32)
EGFR (Non-African Amer.): >60
Osmolality: 274 (ref 275–301)
Sodium: 137 mmol/L (ref 136–145)

## 2013-03-27 LAB — HEMOGLOBIN: HGB: 8.7 g/dL — ABNORMAL LOW (ref 12.0–16.0)

## 2013-03-27 LAB — PLATELET COUNT: Platelet: 113 10*3/uL — ABNORMAL LOW (ref 150–440)

## 2013-04-13 ENCOUNTER — Encounter: Payer: Self-pay | Admitting: General Practice

## 2013-04-17 ENCOUNTER — Encounter: Payer: Self-pay | Admitting: General Practice

## 2013-04-19 ENCOUNTER — Other Ambulatory Visit: Payer: Self-pay | Admitting: Cardiovascular Disease

## 2013-04-20 ENCOUNTER — Other Ambulatory Visit: Payer: Self-pay | Admitting: *Deleted

## 2013-04-20 MED ORDER — CLOPIDOGREL BISULFATE 75 MG PO TABS: 75.0000 mg | ORAL_TABLET | Freq: Every day | ORAL | Status: DC

## 2013-04-20 NOTE — Telephone Encounter (Signed)
Refilled plavix sent to [facility] pharmacy.

## 2013-05-18 ENCOUNTER — Encounter: Payer: Self-pay | Admitting: General Practice

## 2013-07-27 ENCOUNTER — Other Ambulatory Visit: Payer: Self-pay | Admitting: Cardiovascular Disease

## 2013-08-21 ENCOUNTER — Ambulatory Visit: Payer: Medicare Other | Admitting: Cardiovascular Disease

## 2013-08-25 ENCOUNTER — Other Ambulatory Visit: Payer: Self-pay | Admitting: Cardiovascular Disease

## 2013-08-25 ENCOUNTER — Other Ambulatory Visit: Payer: Self-pay

## 2013-08-25 MED ORDER — CLOPIDOGREL BISULFATE 75 MG PO TABS: 75.0000 mg | ORAL_TABLET | Freq: Every day | ORAL | Status: DC

## 2013-08-25 NOTE — Telephone Encounter (Signed)
Requested Prescriptions   Signed Prescriptions Disp Refills  . clopidogrel (PLAVIX) 75 MG tablet 30 tablet 3    Sig: Take 1 tablet (75 mg total) by mouth daily.    Authorizing Provider: GOLLAN, TIMOTHY J    Ordering User: Zuzanna Maroney C    

## 2013-08-26 ENCOUNTER — Ambulatory Visit (INDEPENDENT_AMBULATORY_CARE_PROVIDER_SITE_OTHER): Payer: Medicare Other | Admitting: Cardiovascular Disease

## 2013-08-26 ENCOUNTER — Encounter: Payer: Self-pay | Admitting: Cardiovascular Disease

## 2013-08-26 VITALS — BP 138/62 | HR 58 | Ht 63.0 in | Wt 127.5 lb

## 2013-08-26 DIAGNOSIS — E785 Hyperlipidemia, unspecified: Secondary | ICD-10-CM

## 2013-08-26 DIAGNOSIS — I10 Essential (primary) hypertension: Secondary | ICD-10-CM

## 2013-08-26 DIAGNOSIS — I2510 Atherosclerotic heart disease of native coronary artery without angina pectoris: Secondary | ICD-10-CM

## 2013-08-26 DIAGNOSIS — I701 Atherosclerosis of renal artery: Secondary | ICD-10-CM

## 2013-08-26 DIAGNOSIS — R079 Chest pain, unspecified: Secondary | ICD-10-CM

## 2013-08-26 DIAGNOSIS — E119 Type 2 diabetes mellitus without complications: Secondary | ICD-10-CM

## 2013-08-26 DIAGNOSIS — I259 Chronic ischemic heart disease, unspecified: Secondary | ICD-10-CM

## 2013-08-26 DIAGNOSIS — I209 Angina pectoris, unspecified: Secondary | ICD-10-CM

## 2013-08-26 MED ORDER — NITROGLYCERIN 0.4 MG SL SUBL: 0.4000 mg | SUBLINGUAL_TABLET | SUBLINGUAL | Status: DC | PRN

## 2013-08-26 MED ORDER — METOPROLOL SUCCINATE ER 25 MG PO TB24: 25.0000 mg | ORAL_TABLET | Freq: Every day | ORAL | Status: DC

## 2013-08-26 MED ORDER — CLOPIDOGREL BISULFATE 75 MG PO TABS: 75.0000 mg | ORAL_TABLET | Freq: Once | ORAL | Status: DC

## 2013-08-26 NOTE — Assessment & Plan Note (Signed)
We have discussed her chest pain symptoms with her. She is unsure if she would like to do any workup at this time. Stress testing and cardiac catheterization were discussed. She will monitor her symptoms and call our office if symptoms become more frequent, more intense. She does report recent stressors, loss of her granddaughter recently who was 75 years old

## 2013-08-26 NOTE — Assessment & Plan Note (Signed)
We have encouraged continued exercise, careful diet management in an effort to lose weight. 

## 2013-08-26 NOTE — Assessment & Plan Note (Signed)
Prior stent placement. We'll confirm that she has followup with Dr. dew

## 2013-08-26 NOTE — Progress Notes (Signed)
Patient ID: Axelle Szwed, female    DOB: Jun 23, 1936, 77 y.o.   MRN: [id number]  HPI Comments: Ms. Julieanne Manson is a very pleasant 77 year old woman  with a history of coronary artery disease, bypass surgery in 1995, diabetes with hemoglobin A1c 7.0,  stent to her graft to the OM several years ago with chest pain January 2013 and repeat stenting to the graft to the OM with a Xience 3.5 x 18 mm stent, also with left renal PCI in 1998, redo PCI March 2010 followed by Dr. Wyn Quaker, who presents for routine followup   stent to the vein graft in January 2013. She is very active and takes care of a large family.  She had recent right knee replacement in July 2014 with good recovery.  She does report having episodes of chest pain starting 2 weeks ago, sometimes 2 or 3 short episodes per day. Typically will happen while sitting, rarely while exerting herself. Feels like a sharp pain, does not last very long. She took a nitroglycerin on one episode. She is uncertain if this is cardiac or something else Typically active at baseline. Cholesterol at goal  Details from her bypass surgery showed LIMA to the LAD, vein graft to the PDA and vein graft to the OM Details from the cardiac catheterization January 2013 show proximal LAD occlusion, proximal left circumflex occlusion, proximal RCA occlusion, graft to the OM with 90% disease proximally. DES stent was placed.  Echocardiogram dated January 2012 shows normal systolic function, mild MR and TR  EKG shows sinus bradycardia with no significant ST or T wave changes   Outpatient Encounter Prescriptions as of 08/26/2013  Medication Sig  . aspirin 81 MG tablet Take 81 mg by mouth daily.   . Calcium Carbonate-Vit D-Min (CALTRATE 600+D PLUS PO) Take 200 Units by mouth daily.  . clopidogrel (PLAVIX) 75 MG tablet TAKE ONE TABLET BY MOUTH ONCE DAILY  . CRESTOR 40 MG tablet TAKE ONE TABLET BY MOUTH EVERY DAY  . lisinopril (PRINIVIL,ZESTRIL) 20 MG tablet Take 20 mg by mouth  daily.  . metFORMIN (GLUCOPHAGE) 500 MG tablet Take 500 mg by mouth 2 (two) times daily with a meal.  . metoprolol succinate (TOPROL-XL) 25 MG 24 hr tablet Take 12.5 mg by mouth daily.   . Multiple Vitamins-Minerals (MULTIVITAMIN WITH MINERALS) tablet Take 1 tablet by mouth daily.  . nitroGLYCERIN (NITROSTAT) 0.4 MG SL tablet Place 1 tablet (0.4 mg total) under the tongue every 5 (five) minutes as needed for chest pain.  . traMADol (ULTRAM) 50 MG tablet Take 50 mg by mouth every 6 (six) hours as needed.   . [DISCONTINUED] clopidogrel (PLAVIX) 75 MG tablet Take 1 tablet (75 mg total) by mouth daily.    Review of Systems  Constitutional: Negative.   HENT: Negative.   Eyes: Negative.   Respiratory: Positive for chest tightness.   Cardiovascular: Positive for chest pain.  Gastrointestinal: Negative.   Endocrine: Negative.   Musculoskeletal: Positive for arthralgias and joint swelling.  Skin: Negative.   Allergic/Immunologic: Negative.   Neurological: Negative.   Hematological: Negative.   Psychiatric/Behavioral: Negative.   All other systems reviewed and are negative.    BP 138/62  Pulse 58  Ht 5\' 3"  (1.6 m)  Wt 127 lb 8 oz (57.834 kg)  BMI 22.59 kg/m2  Physical Exam  Nursing note and vitals reviewed. Constitutional: She is oriented to person, place, and time. She appears well-developed and well-nourished.  HENT:  Head: Normocephalic.  Nose: Nose normal.  Mouth/Throat: Oropharynx is clear and moist.  Eyes: Conjunctivae are normal. Pupils are equal, round, and reactive to light.  Neck: Normal range of motion. Neck supple. No JVD present.  Cardiovascular: Normal rate, regular rhythm, S1 normal, S2 normal, normal heart sounds and intact distal pulses.  Exam reveals no gallop and no friction rub.   No murmur heard. Pulmonary/Chest: Effort normal and breath sounds normal. No respiratory distress. She has no wheezes. She has no rales. She exhibits no tenderness.  Abdominal: Soft.  Bowel sounds are normal. She exhibits no distension. There is no tenderness.  Musculoskeletal: Normal range of motion. She exhibits no edema and no tenderness.  Lymphadenopathy:    She has no cervical adenopathy.  Neurological: She is alert and oriented to person, place, and time. Coordination normal.  Skin: Skin is warm and dry. No rash noted. No erythema.  Psychiatric: She has a normal mood and affect. Her behavior is normal. Judgment and thought content normal.    Assessment and Plan

## 2013-08-26 NOTE — Assessment & Plan Note (Signed)
Cholesterol is at goal on the current lipid regimen. No changes to the medications were made.  

## 2013-08-26 NOTE — Patient Instructions (Addendum)
You are doing well. No medication changes were made.  Call the office for more chest pain Take nitro for chest pain as needed If symptoms get worse, we can do a cardiac cath  Please call us if you have new issues that need to be addressed before your next appt.  Your physician wants you to follow-up in: 6 months.  You will receive a reminder letter in the mail two months in advance. If you don't receive a letter, please call our office to schedule the follow-up appointment.

## 2013-08-26 NOTE — Assessment & Plan Note (Signed)
Blood pressure is well controlled on today's visit. No changes made to the medications. 

## 2014-02-26 ENCOUNTER — Ambulatory Visit: Payer: Self-pay | Admitting: Family Medicine

## 2014-03-03 ENCOUNTER — Ambulatory Visit (INDEPENDENT_AMBULATORY_CARE_PROVIDER_SITE_OTHER): Payer: Medicare Other | Admitting: Cardiovascular Disease

## 2014-03-03 ENCOUNTER — Encounter: Payer: Self-pay | Admitting: Cardiovascular Disease

## 2014-03-03 VITALS — BP 122/60 | HR 59 | Ht 64.0 in | Wt 134.8 lb

## 2014-03-03 DIAGNOSIS — I10 Essential (primary) hypertension: Secondary | ICD-10-CM

## 2014-03-03 DIAGNOSIS — E119 Type 2 diabetes mellitus without complications: Secondary | ICD-10-CM

## 2014-03-03 DIAGNOSIS — I2510 Atherosclerotic heart disease of native coronary artery without angina pectoris: Secondary | ICD-10-CM

## 2014-03-03 DIAGNOSIS — E785 Hyperlipidemia, unspecified: Secondary | ICD-10-CM

## 2014-03-03 DIAGNOSIS — I701 Atherosclerosis of renal artery: Secondary | ICD-10-CM

## 2014-03-03 NOTE — Assessment & Plan Note (Signed)
Currently with no symptoms of angina. No further workup at this time. Continue current medication regimen. 

## 2014-03-03 NOTE — Assessment & Plan Note (Signed)
Blood pressure is well controlled on today's visit. No changes made to the medications. 

## 2014-03-03 NOTE — Patient Instructions (Signed)
You are doing well. No medication changes were made.  Goal  total cholesterol <150 Goal LDL <70  Please call us if you have new issues that need to be addressed before your next appt.  Your physician wants you to follow-up in: 6 months.  You will receive a reminder letter in the mail two months in advance. If you don't receive a letter, please call our office to schedule the follow-up appointment.

## 2014-03-03 NOTE — Assessment & Plan Note (Signed)
We have encouraged continued exercise, careful diet management in an effort to lose weight. Hemoglobin A1c well controlled

## 2014-03-03 NOTE — Assessment & Plan Note (Signed)
Cholesterol is at goal on the current lipid regimen. No changes to the medications were made.  

## 2014-03-03 NOTE — Progress Notes (Signed)
Patient ID: Tracey [name], female    DOB: [birth year], 78 y.o.   MRN: [id number]  HPI Comments: Tracey [name] is a very pleasant 78 year old woman  with a history of coronary artery disease, bypass surgery in 1995, diabetes with hemoglobin A1c 7.0,  stent to her graft to the OM several years ago with chest pain January 2013 and repeat stenting to the graft to the OM with a Xience 3.5 x 18 mm stent (patient and husband report that she required delivery of a shock for  resuscitation during the procedure), also with left renal PCI in 1998, redo PCI March 2010 followed by [name], who presents for routine followup   She is very active and takes care of a large family.  She had recent right knee replacement in July 2014 with good recovery. In followup today, she reports that she is doing well with no shortness of breath or chest discomfort. She is considering having knee surgery on her other knee as she did well after her first total knee replacement. She does not participate in a regular exercise program but is typically active   Cholesterol at goal  Details from her bypass surgery showed LIMA to the LAD, vein graft to the PDA and vein graft to the OM Details from the cardiac catheterization January 2013 show proximal LAD occlusion, proximal left circumflex occlusion, proximal RCA occlusion, graft to the OM with 90% disease proximally. DES stent was placed.  Echocardiogram dated January 2012 shows normal systolic function, mild MR and TR  EKG shows sinus bradycardia  rate of 59 beats per minute with no significant ST or T wave changes   Outpatient Encounter Prescriptions as of 03/03/2014  Medication Sig  . aspirin 81 MG tablet Take 81 mg by mouth daily.   . Calcium Carbonate-Vit D-Min (CALTRATE 600+D PLUS PO) Take 200 Units by mouth daily.  . clopidogrel (PLAVIX) 75 MG tablet Take 1 tablet (75 mg total) by mouth once.  . CRESTOR 40 MG tablet TAKE ONE TABLET BY MOUTH EVERY DAY  . lisinopril  (PRINIVIL,ZESTRIL) 20 MG tablet Take 20 mg by mouth daily.  . metFORMIN (GLUCOPHAGE) 500 MG tablet Take 500 mg by mouth 2 (two) times daily with a meal.  . metoprolol succinate (TOPROL-XL) 25 MG 24 hr tablet Take 1 tablet (25 mg total) by mouth daily.  . Multiple Vitamins-Minerals (MULTIVITAMIN WITH MINERALS) tablet Take 1 tablet by mouth daily.  . nitroGLYCERIN (NITROSTAT) 0.4 MG SL tablet Place 1 tablet (0.4 mg total) under the tongue every 5 (five) minutes as needed for chest pain.   Review of Systems  Constitutional: Negative.   HENT: Negative.   Eyes: Negative.   Respiratory: Positive for chest tightness.   Cardiovascular: Positive for chest pain.  Gastrointestinal: Negative.   Endocrine: Negative.   Musculoskeletal: Positive for arthralgias and joint swelling.  Skin: Negative.   Allergic/Immunologic: Negative.   Neurological: Negative.   Hematological: Negative.   Psychiatric/Behavioral: Negative.   All other systems reviewed and are negative.   BP 122/60  Pulse 59  Ht 5\' 4"  (1.626 m)  Wt 134 lb 12 oz (61.122 kg)  BMI 23.12 kg/m2  Physical Exam  Nursing note and vitals reviewed. Constitutional: She is oriented to person, place, and time. She appears well-developed and well-nourished.  HENT:  Head: Normocephalic.  Nose: Nose normal.  Mouth/Throat: Oropharynx is clear and moist.  Eyes: Conjunctivae are normal. Pupils are equal, round, and reactive to light.  Neck: Normal range of motion. Neck  supple. No JVD present.  Cardiovascular: Normal rate, regular rhythm, S1 normal, S2 normal, normal heart sounds and intact distal pulses.  Exam reveals no gallop and no friction rub.   No murmur heard. Pulmonary/Chest: Effort normal and breath sounds normal. No respiratory distress. She has no wheezes. She has no rales. She exhibits no tenderness.  Abdominal: Soft. Bowel sounds are normal. She exhibits no distension. There is no tenderness.  Musculoskeletal: Normal range of motion.  She exhibits no edema and no tenderness.  Lymphadenopathy:    She has no cervical adenopathy.  Neurological: She is alert and oriented to person, place, and time. Coordination normal.  Skin: Skin is warm and dry. No rash noted. No erythema.  Psychiatric: She has a normal mood and affect. Her behavior is normal. Judgment and thought content normal.    Assessment and Plan

## 2014-03-03 NOTE — Assessment & Plan Note (Signed)
Prior stent placement.  followup with Dr. dew

## 2014-03-15 DIAGNOSIS — M1990 Unspecified osteoarthritis, unspecified site: Secondary | ICD-10-CM | POA: Insufficient documentation

## 2014-08-17 ENCOUNTER — Other Ambulatory Visit: Payer: Self-pay | Admitting: Cardiovascular Disease

## 2014-08-31 ENCOUNTER — Ambulatory Visit: Payer: Medicare Other | Admitting: Cardiovascular Disease

## 2014-09-22 ENCOUNTER — Ambulatory Visit (INDEPENDENT_AMBULATORY_CARE_PROVIDER_SITE_OTHER): Payer: Medicare HMO | Admitting: Cardiovascular Disease

## 2014-09-22 ENCOUNTER — Encounter: Payer: Self-pay | Admitting: Cardiovascular Disease

## 2014-09-22 VITALS — BP 120/60 | HR 62 | Ht 63.0 in | Wt 134.8 lb

## 2014-09-22 DIAGNOSIS — Z951 Presence of aortocoronary bypass graft: Secondary | ICD-10-CM

## 2014-09-22 DIAGNOSIS — I701 Atherosclerosis of renal artery: Secondary | ICD-10-CM

## 2014-09-22 DIAGNOSIS — I10 Essential (primary) hypertension: Secondary | ICD-10-CM

## 2014-09-22 DIAGNOSIS — E1169 Type 2 diabetes mellitus with other specified complication: Secondary | ICD-10-CM

## 2014-09-22 DIAGNOSIS — E785 Hyperlipidemia, unspecified: Secondary | ICD-10-CM

## 2014-09-22 MED ORDER — ROSUVASTATIN CALCIUM 40 MG PO TABS: 40.0000 mg | ORAL_TABLET | Freq: Every day | ORAL | Status: DC

## 2014-09-22 MED ORDER — CLOPIDOGREL BISULFATE 75 MG PO TABS: 75.0000 mg | ORAL_TABLET | Freq: Once | ORAL | Status: DC

## 2014-09-22 NOTE — Assessment & Plan Note (Signed)
Currently with no symptoms of angina. No further workup at this time. Continue current medication regimen. 

## 2014-09-22 NOTE — Assessment & Plan Note (Signed)
Followed by Dr Dew.   

## 2014-09-22 NOTE — Patient Instructions (Signed)
You are doing well. No medication changes were made.  Please call us if you have new issues that need to be addressed before your next appt.  Your physician wants you to follow-up in: 6 months.  You will receive a reminder letter in the mail two months in advance. If you don't receive a letter, please call our office to schedule the follow-up appointment.   

## 2014-09-22 NOTE — Assessment & Plan Note (Signed)
We have encouraged continued exercise, careful diet management   

## 2014-09-22 NOTE — Progress Notes (Signed)
Patient ID: Tracey [name], female    DOB: February 15, 1936, 79 y.o.   MRN: [id number]  HPI Comments: Tracey [name] is a very pleasant 79 year old woman  with a history of coronary artery disease, bypass surgery in 1995, diabetes with hemoglobin A1c 7.0,  stent to her graft to the OM several years ago with chest pain January 2013 and repeat stenting to the graft to the OM with a Xience 3.5 x 18 mm stent (patient and husband report that she required delivery of a shock for  resuscitation during the procedure), also with left renal PCI in 1998, redo PCI March 2010 followed by [name], who presents for routine followup of her coronary artery disease   In follow-up, she reports that she is doing well. She denies any significant chest pain or shortness of breath. Notes indicating hemoglobin A1c now 7.2. She continues on Crestor 20 mg daily Lipid panel from 2015 not available, prior to that LDL was 71 in December 2014 She reports that her blood pressure is well controlled at home  EKG on today's visit shows normal sinus rhythm with rate 62 bpm, no significant ST or T-wave changes  Other past medical history  She is very active and takes care of a large family.  She had recent right knee replacement in July 2014 with good recovery.  Details from her bypass surgery showed LIMA to the LAD, vein graft to the PDA and vein graft to the OM Details from the cardiac catheterization January 2013 show proximal LAD occlusion, proximal left circumflex occlusion, proximal RCA occlusion, graft to the OM with 90% disease proximally. DES stent was placed.  Echocardiogram dated January 2012 shows normal systolic function, mild MR and TR   Allergies  Allergen Reactions  . Erythromycin     Outpatient Encounter Prescriptions as of 09/22/2014  Medication Sig  . aspirin 81 MG tablet Take 81 mg by mouth daily.   . Calcium Carbonate-Vit D-Min (CALTRATE 600+D PLUS PO) Take 200 Units by mouth daily.  . clopidogrel (PLAVIX) 75  MG tablet Take 1 tablet (75 mg total) by mouth once.  . CRESTOR 40 MG tablet TAKE ONE TABLET BY MOUTH ONCE DAILY  . lisinopril (PRINIVIL,ZESTRIL) 20 MG tablet Take 20 mg by mouth daily.  . metFORMIN (GLUCOPHAGE) 500 MG tablet Take 500 mg by mouth 2 (two) times daily with a meal.  . metoprolol succinate (TOPROL-XL) 25 MG 24 hr tablet Take 1 tablet (25 mg total) by mouth daily.  . Multiple Vitamins-Minerals (MULTIVITAMIN WITH MINERALS) tablet Take 1 tablet by mouth daily.  . nitroGLYCERIN (NITROSTAT) 0.4 MG SL tablet Place 1 tablet (0.4 mg total) under the tongue every 5 (five) minutes as needed for chest pain.    Past Medical History  Diagnosis Date  . Hyperlipidemia   . Diabetes mellitus     non insulin dependent  . Coronary artery disease   . Atrial fibrillation     history  . Peripheral vascular disease   . Hypertension     Past Surgical History  Procedure Laterality Date  . Cardiac catheterization    . Coronary artery bypass graft      with LIMA  to the LAD, SVG to OM1, SVG to PDA  . Appendectomy    . Tonsillectomy    . Coronary angioplasty with stent placement    . Renal artery stent      left, By Dr Hulda Humphrey  . Replacement total knee  03/25/2013    right  Social History  reports that she has never smoked. She does not have any smokeless tobacco history on file. She reports that she does not drink alcohol or use illicit drugs.  Family History family history includes Heart disease in her father; Other in her brother, brother, and brother.   Review of Systems  Constitutional: Negative.   Respiratory: Negative.   Cardiovascular: Negative.   Gastrointestinal: Negative.   Musculoskeletal: Positive for joint swelling and arthralgias.  Neurological: Negative.   Hematological: Negative.   Psychiatric/Behavioral: Negative.   All other systems reviewed and are negative.   BP 120/60 mmHg  Pulse 62  Ht 5\' 3"  (1.6 m)  Wt 134 lb 12 oz (61.122 kg)  BMI 23.88  kg/m2  Physical Exam  Constitutional: She is oriented to person, place, and time. She appears well-developed and well-nourished.  HENT:  Head: Normocephalic.  Nose: Nose normal.  Mouth/Throat: Oropharynx is clear and moist.  Eyes: Conjunctivae are normal. Pupils are equal, round, and reactive to light.  Neck: Normal range of motion. Neck supple. No JVD present.  Cardiovascular: Normal rate, regular rhythm, S1 normal, S2 normal, normal heart sounds and intact distal pulses.  Exam reveals no gallop and no friction rub.   No murmur heard. Pulmonary/Chest: Effort normal and breath sounds normal. No respiratory distress. She has no wheezes. She has no rales. She exhibits no tenderness.  Abdominal: Soft. Bowel sounds are normal. She exhibits no distension. There is no tenderness.  Musculoskeletal: Normal range of motion. She exhibits no edema or tenderness.  Lymphadenopathy:    She has no cervical adenopathy.  Neurological: She is alert and oriented to person, place, and time. Coordination normal.  Skin: Skin is warm and dry. No rash noted. No erythema.  Psychiatric: She has a normal mood and affect. Her behavior is normal. Judgment and thought content normal.    Assessment and Plan  Nursing note and vitals reviewed.

## 2014-09-22 NOTE — Assessment & Plan Note (Signed)
Blood pressure is well controlled on today's visit. No changes made to the medications. 

## 2014-09-22 NOTE — Assessment & Plan Note (Signed)
Cholesterol is at goal on the current lipid regimen, based on labs from December 2014. No changes to the medications were made.

## 2014-10-19 ENCOUNTER — Other Ambulatory Visit: Payer: Self-pay

## 2014-10-19 MED ORDER — METOPROLOL SUCCINATE ER 25 MG PO TB24: 25.0000 mg | ORAL_TABLET | Freq: Every day | ORAL | Status: DC

## 2014-10-19 NOTE — Telephone Encounter (Signed)
Refill sent for metoprolol 90 day supply

## 2014-10-19 NOTE — Telephone Encounter (Signed)
90 day supply

## 2014-10-22 ENCOUNTER — Other Ambulatory Visit: Payer: Self-pay | Admitting: *Deleted

## 2014-10-22 MED ORDER — METOPROLOL SUCCINATE ER 25 MG PO TB24: 25.0000 mg | ORAL_TABLET | Freq: Every day | ORAL | Status: DC

## 2015-01-07 NOTE — Discharge Summary (Signed)
PATIENT NAME:  Tracey [name], Tracey [name] MR#:  [id number] DATE OF BIRTH:  November 26, 1935  DATE OF ADMISSION:  03/25/2013 DATE OF DISCHARGE:  03/28/2013  ADMITTING DIAGNOSIS: Degenerative arthrosis of the right knee.   DISCHARGE DIAGNOSIS: Degenerative arthrosis of the right knee.   HISTORY: The patient is a 79 year old female who has been followed at [facility] for progression of several year history of bilateral knee pain with the right being more symptomatic than the left. The patient has been continuing to report start-up stiffness as well as activity-related swelling of the knee. She localized most of the pain along the medial aspect of the knee. Previously her knee was aspirated and demonstrated calcium phosphate crystals consistent with pseudogout. The patient had not seen any significant improvement in her condition despite anti-inflammatory medications, Tylenol and cortisone steroid injections as well as viscosupplementation. The pain had progressed to the point that it was significantly interfering with her activities of daily living. X-rays taken in the [facility] orthopedic department showed narrowing of the medial cartilage space with associated varus alignment. Osteophyte formation as well as subchondral sclerosis was noted. Surgical clips were present in the soft tissue. After discussion of the risks and benefits of surgical intervention, the patient expressed her understanding of the risks and benefits and agreed for plans for surgical intervention.   PROCEDURE:  Right total knee arthroplasty using computer-assisted navigation.   ANESTHESIA:  General.   SOFT TISSUE RELEASE:  Anterior cruciate ligament, posterior cruciate ligament, deep medial collateral ligaments, as well as the patellofemoral ligament.   IMPLANTS UTILIZED:  DePuy PFC Sigma size 2.5 posterior stabilized femoral component (cemented), size 2 MBT tibial component (cemented), 35 mm three pegged oval dome patella (cemented),  and a 12.5 mm stabilized rotating platform polyethylene insert. Gentamicin cement was used through the procedure. The patient tolerated the procedure very well. She had no complications. She was then taken to the PAC-U where she was stabilized and then transferred to the orthopedic floor. The patient began receiving anticoagulation therapy of Lovenox 30 mg one injection subcutaneous q. 12 hours per anesthesia and pharmacy protocol. She had previously been on Plavix, but had recommended not to start this back into she was off the Lovenox. She is also on a aspirin prior to surgery. The Plavix was stopped five days prior to surgery but she was to continue aspirin until the day of surgery. Subsequently, a spinal and a femoral nerve block was not able to be performed at this. The patient was also fitted with the AV-I compression foot pumps bilaterally set at 80 mmHg. She was fitted with TED stockings bilaterally. These were allowed to be removed one hour per eight hour shift. The right one was applied on day two following removal of the Hemovac and dressing change. The patient's heels were elevated off the bed using rolled towels. She had no evidence of any deep venous thromboses. Homans signs were negative.   The patient has denied any chest pain or shortness of breath. Vital signs have been stable. She has been afebrile. Hemodynamically she was stable. No transfusions were given other than the Autovac transfusion given the first six hours.   Physical therapy was initiated on day one for gait training and transfers. Upon being discharged she was ambulating greater than 200 feet. She was able go up and down four steps. She was independent with bed to chair transfers.  Occupational therapy was also initiated on day one for activities of daily living and assistive devices. She has  progressed very nicely. There have been no complications.   The patient's IV, Foley and Hemovac were discontinued on day two along with  dressing change. The wound was free of any drainage or signs of infection. Polar Care was reapplied to the surgical leg maintaining a temperature of 40 to 50 degrees Fahrenheit.   DISPOSITION: The patient was discharged to home in improved stable condition.   DISCHARGE INSTRUCTIONS:  She is to continue weight-bearing as tolerated. Continue using a walker until cleared by physical therapy to go to a quad cane.  She will receive home health physical therapy.  Elevate the leg when sitting.  Continue with TED stockings bilaterally. These are allowed to be removed at night but are to be worn during the day. Continue using Polar Care maintaining a temperature of 40 to 50 degrees Fahrenheit. Change dressing as needed. She is placed on an ADA diet.  She is to call the clinic if she has any temperatures of 101.5 or greater or excessive bleeding. She has a follow-up appointment on July 22nd at 10:45. She is to resume her regular medication that she was on prior to admission. She was given a prescription for Lovenox 40 mg subcutaneously daily for 14 days, then discontinue and begin taking her aspirin 81 mg as well as Plavix 75 mg as she was prior to surgery. Also a prescription for Roxicodone 5 to 10 mg every 4 to 6 hours p.r.n. for pain, dispensed 80 and Travenol 50 to 100 mg 1 to 2 tablets every 4 to 6 hours p.r.n. for pain, dispensing 60.   DRUG ALLERGIES: CODEINE, ERYTHROMYCIN, SULFA DRUGS.   PAST MEDICAL HISTORY:  Hypertension, hyperlipidemia, diabetes, osteoporosis, heart blockage, kidney stones.   ____________________________ Vance Peper, PA [id number] D: 03/27/2013 08:04:00 ET T: 03/27/2013 10:19:55 ET JOB#: [id number]  cc: Vance Peper, PA, <Dictator> JON WOLFE PA ELECTRONICALLY SIGNED 03/29/2013 21:32

## 2015-01-07 NOTE — Op Note (Signed)
PATIENT NAME:  Tracey [name], REHFELDT MR#:  [id number] DATE OF BIRTH:  1936-01-09  DATE OF PROCEDURE:  03/25/2013  PREOPERATIVE DIAGNOSIS:  Degenerative arthrosis of the right knee.   POSTOPERATIVE DIAGNOSIS:  Degenerative arthrosis of the right knee.   PROCEDURE PERFORMED:  Right total knee arthroplasty using computer-assisted navigation.   SURGEON: Skip Estimable, MD.  ASSISTANT:  Vance Peper, PA (required to maintain retraction throughout the procedure).   ANESTHESIA:  General.   ESTIMATED BLOOD LOSS:  50 mL   FLUIDS REPLACED: 1700 mL of crystalloid.  TOURNIQUET TIME:  99 minutes.   DRAINS: Two medium drains to reinfusion system.  SOFT TISSUE RELEASES:  Anterior cruciate ligament, posterior cruciate ligament, deep medial collateral ligament and patellofemoral ligament.  IMPLANTS UTILIZED: DePuy PFC Sigma size 2.5 posterior stabilized femoral component (cemented), size 2 MBT tibial component (cemented), 35 mm 3-peg oval dome patella (cemented) and a 12.5 mm stabilized rotating platform polyethylene insert. Gentamicin cement was used through the procedure.   INDICATIONS FOR SURGERY: The patient is a 79 year old female who has been seen for complaints of progressive bilateral knee pain with right knee more symptomatic than the left. X-rays demonstrate severe degenerative change of a relative varus deformity. After discussion of the risks and benefits of surgical intervention, the patient expressed understanding of the risks, benefits and agreed with plans for total knee arthroplasty.   PROCEDURE IN DETAIL:  The patient was brought into operating room and, after adequate general anesthesia was achieved, a tourniquet was placed on the patient's upper right thigh. The patient's right knee and leg were cleaned and prepped with alcohol and DuraPrep, draped in the usual sterile fashion. A "timeout" was performed as per usual protocol.  The right lower extremity was exsanguinated using an Esmarch, and  the tourniquet was inflated to 300 mmHg.  Anterior and longitudinal incisions were made, followed by a standard mid vastus approach. A large effusion was evacuated. Deep fibers of the medial collateral ligament were elevated in a subperiosteal fashion off the medial flare of the tibia so as to maintain a continuous soft tissue sleeve. The patella was subluxed laterally and the patellofemoral ligament was incised.  Inspection of the knee demonstrated severe degenerative changes in tricompartmental fashion. Prominent osteophytes were debrided using a rongeur. Anterior and posterior cruciate ligaments were excised. Two 4.0 mm Schanz pins were inserted into the femur and into the tibia for attachment of the array of trackers used for computer-assisted navigation. Hip center was identified using a circumduction technique. Distal landmarks were mapped using a computer. The distal femur and proximal tibia were mapped using a computer. Distal femoral cutting guide was positioned using computer-assisted navigation so as to achieve 5-degree distal valgus cut. The cut was performed and verified using the computer.  Distal femur was sized and it was felt that a size 2.5 femoral component was appropriate. A size 2.5 cutting guide was positioned and anterior cut was performed and verified using a computer. This was followed by completion of the posterior and chamfer cuts. Femoral cutting guide for central box was then positioned and a central box cut was performed. Attention was then directed to the proximal tibia.  Medial and lateral menisci were excised. The extramedullary tibial cutting guide was positioned using computer-assisted navigation so as to achieve 0 degree of varus valgus alignment and 0 degree posterior slope. The cut was performed and verified using computer. The proximal tibia was sized and it was felt that a size 2 tibial tray was appropriate. Tibial  and femoral trials were inserted, followed by insertion  of first, a 10 and subsequently 12.5 mm polyethylene trial. Excellent mediolateral soft tissue balancing was appreciated both in extension and in flexion. Finally, the patella was cut and prepared so as to accommodate a 35 mm 3-peg oval dome patella. Patellar trial was placed and the knee was placed through a range of motion with excellent patellar tracking noted.   Femoral trial was removed. Central post hole for the tibial component was reamed, followed by insertion of a keel punch. Tibial trial was then removed. The cut surfaces of bone were irrigated with copious amounts of normal saline with antibiotic solution using pulsatile lavage and then suctioned dry. Polymethyl methacrylate cement with gentamicin was prepared in the usual fashion using a vacuum mixer. Cement was applied to the cut surface of the proximal tibia, as well as along the undersurface of size 2 MBT tibial component. The tibial component was positioned and impacted into place. Excess cement was removed using freer elevators. Cement was then applied to cut surface of the femur, as well as along the posterior flanges of a size 2.5 posterior stabilized femoral component. Femoral component was positioned and impacted into place. Excess cement was removed using freer elevators. A 12.5 mm polyethylene trial was inserted and the knee was brought in full extension with steady axial compression applied. Finally, cement was applied to the undersurface of a 35 mm 3-peg oval dome patella and the patellar component was positioned and patellar clamp applied. Excess cement was removed using freer elevators.  After adequate curing of the cement, the tourniquet was deflated after total tourniquet time of 99 minutes. Hemostasis was achieved using electrocautery. The knee was irrigated with copious amounts of normal saline with antibiotic solution using pulsatile lavage and then suctioned dry. 20 mL of 1.3% Exparel in 40 mL of injectable normal saline was  injected along the medial and lateral gutters, as well as along the posterior capsule and along the arthrotomy site. A 12.5 mm stabilized rotating platform polyethylene insert was inserted and the knee was placed through a range of motion with excellent patellar tracking and excellent mediolateral soft tissue balancing appreciated. Two medium drains were placed in the wound bed and brought through a separate stab incision to be attached to reinfusion system. Medial parapatellar portion of the incision was reapproximated using interrupted sutures of 1 Vicryl. The subcutaneous tissue was approximated in layers using first 0 Vicryl followed by 2-0 Vicryl. The skin was closed with skin staples. A sterile dressing was applied.   The patient tolerated the procedure well. She was transported to the recovery room in stable condition.  ADDENDUM:  A total of 30 mL of 0.25% Marcaine with epinephrine was injected along the subcutaneous tissue before closure of the final layers.      ____________________________ Laurice Record. Holley Bouche., MD jph:[id number] D: 03/25/2013 11:34:38 ET T: 03/25/2013 12:09:21 ET JOB#: [id number]  cc: Jeneen Rinks P. Holley Bouche., MD, <Dictator> JAMES P Holley Bouche MD ELECTRONICALLY SIGNED 03/28/2013 12:15

## 2015-01-09 NOTE — Discharge Summary (Signed)
PATIENT NAME:  Tracey [name], TOWELL MR#:  [id number] DATE OF BIRTH:  Jan 18, 1936  DATE OF ADMISSION:  10/07/2011 DATE OF DISCHARGE:  10/09/2011  ADMITTING PHYSICIAN: Dr. Gladstone Lighter  DISCHARGING PHYSICIAN: Dr. Gladstone Lighter  PRIMARY CARE PHYSICIAN: Dr. Yevonne Aline IN THE HOSPITAL: Cardiology consultation by Dr. Serafina Royals.   DISCHARGE DIAGNOSES:  1. Unstable angina with 85% graft stenosis of marginal artery requiring a stent this admission.  2. Coronary artery disease status post bypass graft surgery and stent placement. Prior stent placement in March 2010 with marginal artery graft.  3. Non-insulin-dependent diabetes mellitus.  4. Hyperlipidemia.  5. Left renal artery stenosis status post stent in the past.   DISCHARGE HOME MEDICATIONS:  1. Plavix 75 mg p.o. daily.  2. Toprol 25 mg p.o. daily.  3. Lisinopril 20 mg p.o. daily.  4. Multivitamin 1 tablet daily.  5. Calcium vitamin D with 600 mg calcium 1 tablet p.o. daily.  6. Aspirin 81 mg p.o. daily.  7. Crestor 20 mg p.o. daily.  8. Metformin 500 mg p.o. b.i.d. Patient was advised to start the metformin from 10/11/2011 as it was held in the hospital for her cardiac catheterization.    DISCHARGE DIET: Low sodium, ADA diet.   DISCHARGE ACTIVITY: As tolerated.    FOLLOW UP INSTRUCTIONS:  1. Advised to follow up with Dr. Nehemiah Massed in 1 to 2 weeks. Appointment scheduled for 10/23/2011 at 9:45 a.m.   2. Follow up with Dr. Lovie Macadamia in two weeks. Appointment scheduled for 10/25/2011 at 9:30 a.m.    LABORATORY, DIAGNOSTIC AND RADIOLOGICAL DATA: Labs at the time of discharge: WBC 5.4, hemoglobin 11.3, hematocrit 33.2, platelet count 149.   Sodium 140, potassium 4.0, chloride 101, bicarbonate 27, BUN 16, creatinine 0.84, glucose 102, calcium 8.7, CK 39, CK-MB 0.5. Troponin have remained negative while in the hospital. Hemoglobin A1c 7.0.   LDL 68, HDL 37, total cholesterol 141, triglycerides 179. Echo Doppler on  admission showing moderate mitral regurgitation, mild tricuspid regurgitation, LV systolic function is normal with ejection fraction of 55%. Chest x-ray on admission showing no evidence of acute cardiopulmonary disease, clear lung fields.   Cardiac catheterization done on 10/08/2011 showing 100% stenosis of proximal LAD with 100% stenosis of proximal circumflex, 100% stenosis of proximal RCA. LIMA graft to mid LAD was patent. Graft to first obtuse marginal with saphenous vein graft showed a 90% stenosis of proximal anastomosis and graft to right PDA which is the saphenous vein graft showed no evidence of any disease. Patient did receive stent of the PCA of the marginal artery graft.  BRIEF HOSPITAL COURSE: [name] is a 79 year old Caucasian female with prior cardiac history significant for coronary artery disease status post bypass graft surgery in the year 1994 followed by stent placement in 11/2008, diabetes and hyperlipidemia presented to the hospital on 10/07/2011 with typical chest pain that woke her up. She was admitted for unstable angina with her troponins being negative.  1. Unstable angina with typical presentation and prior significant cardiac history. She was seen by Dr. Nehemiah Massed from cardiology standpoint who took her to cardiac catheterization on 01/21 and found 85% to 90% marginal artery graft stenosis. She has received a new drug-eluting stent and all her home medications including aspirin, Plavix, Toprol, statin and lisinopril were continued. Post catheterization patient's right groin did not show any bleeding or tenderness. She has ambulated without any dyspnea on exertion, chest pain and felt great and was ready for discharge.  2. All her other  home medications have been continued. Her metformin had to be held in the hospital for her catheterization and she was advised to be restart it from 10/11/2011. She has understood all the discharge instructions well, her daughter was also at  bedside. They did not have any further questions. Her course has been otherwise uneventful in the hospital.   DISCHARGE CONDITION: Stable.   DISCHARGE DISPOSITION: Home.   TIME SPENT ON DISCHARGE: 45 minutes.  ____________________________ Gladstone Lighter, MD rk:cms D: 10/11/2011 15:41:04 ET T: 10/12/2011 10:38:27 ET JOB#: [id number]  cc: Gladstone Lighter, MD, <Dictator> Youlanda Roys. Lovie Macadamia, MD Corey Skains, MD Gladstone Lighter MD ELECTRONICALLY SIGNED 10/12/2011 14:44

## 2015-01-09 NOTE — H&P (Signed)
PATIENT NAME:  Tracey [name], Tracey [name] MR#:  [id number] DATE OF BIRTH:  05-12-1936  DATE OF ADMISSION:  10/07/2011  ADMITTING PHYSICIAN: Dr. Gladstone Lighter  PRIMARY CARE PHYSICIAN: Dr. Lovie Macadamia PRIMARY CARDIOLOGIST: Dr. Nehemiah Massed  CHIEF COMPLAINT: Chest pain.   HISTORY OF PRESENT ILLNESS: Tracey [name] is a pleasant 79 year old Caucasian female with past medical history significant for history of coronary artery disease status post bypass graft surgery in the year 1994 followed by a 70% in-graft stenosis requiring drug-eluting stent in March 2010, history of diabetes, hyperlipidemia comes to the hospital complaining of chest pain that started late yesterday. Patient says she was not feeling right in her chest all day yesterday. This morning she woke up, still felt tight in her chest, was doing some chores in the kitchen and has noticed that she has been having trouble breathing and then her chest tightness worsened. She felt as if a heavy thing is sitting on her chest. She is also nauseated. Denies any diaphoresis. Has not felt like this lately other than the time when she had her bypass graft surgery. She was seen by Dr. Alveria Apley PA at the end of November as a regular follow-up visit and everything was fine at the time. She was also treated for bronchitis about three weeks ago and feeling better from that standpoint. Because of her typical presentation with the symptoms and history of cardiac disease she is being admitted for unstable angina. Her first set of troponin is negative.   PAST MEDICAL HISTORY:  1. Coronary artery disease status post coronary artery bypass graft surgery with LIMA to the LAD, SVG to OM1, SVG to PDA.  2. Cardiac catheterization in March 2010 with patent LIMA to the LAD, SVG to PDA with 70% stenosis status post PCI and drug-eluting stent to the obtuse marginal.  3. History of atrial fibrillation, now converted back to normal sinus rhythm.  4. Peripheral vascular disease status post  left renal artery stent placement by Dr. Hulda Humphrey.   5. Diabetes mellitus, non-insulin-dependent.  6. Hyperlipidemia.   PAST SURGICAL HISTORY:  1. Appendectomy.  2. Tonsillectomy.  3. Left renal stent placement.  4. Coronary artery bypass graft surgery.  5. Cardiac stent placement.   ALLERGIES: Erythromycin.   CURRENT MEDICATIONS AT HOME:  1. Metformin 500 mg p.o. b.i.d.  2. Aspirin 81 mg p.o. daily.  3. Caltrate with vitamin D 600 mg/200 international units 1 tablet daily.  4. Multivitamin 1 tablet daily.  5. Lisinopril 20 mg p.o. daily.  6. Metoprolol succinate 25 mg p.o. daily.  7. Plavix 75 mg p.o. daily.  8. Crestor 20 mg p.o. daily.   SOCIAL HISTORY: Lives at home with her husband. No history of any smoking or alcohol use.   FAMILY HISTORY: Father died from coronary artery disease complications in his 10X and mom was healthy and lived up to 32 years of age.    REVIEW OF SYSTEMS: CONSTITUTIONAL: No fever, fatigue, or weakness. EYES: No blurred vision, double vision, glaucoma, or cataracts. Uses reading glasses. ENT: No tinnitus, ear pain, hearing loss, epistaxis or discharge. RESPIRATORY: No cough, wheeze, hemoptysis, or chronic obstructive pulmonary disease. As mentioned in history of present illness patient was recently treated about three weeks ago for bronchitis and is recovering. CARDIOVASCULAR: Positive for chest tightness and pressure. No orthopnea, edema, arrhythmia, palpitations, or syncope. GASTROINTESTINAL: Positive for nausea. No vomiting, diarrhea, abdominal pain, hematemesis, or melena. GENITOURINARY: No dysuria, hematochezia, renal calculus, frequency, or incontinence. ENDOCRINE: No polyuria, nocturia, thyroid problems, heat or  cold intolerance. HEMATOLOGY: No anemia, easy bruising or bleeding. SKIN: No acne, rash, or lesions. MUSCULOSKELETAL: No neck, back, shoulder pain, arthritis, or gout. NEUROLOGIC: No numbness, weakness, cerebrovascular accident, transient ischemic  attack, or seizures. PSYCHOLOGICAL: No anxiety, insomnia, or depression.   PHYSICAL EXAMINATION:  VITAL SIGNS: Temperature 98.1 degrees Fahrenheit, pulse 69, respirations 18, blood pressure 153/70, pulse oximetry 97% on room air.   GENERAL: Well built, well nourished female lying in bed, not in any acute distress.   HEENT: Normocephalic, atraumatic. Pupils equal, round, reacting to light. Anicteric sclerae. Extraocular movements intact. Oropharynx clear without erythema, mass, or exudates.   NECK: Supple. No thyromegaly, JVD, or carotid bruits. No lymphadenopathy.   LUNGS: Clear to auscultation bilaterally. No wheeze or crackles. No use of accessory muscles for breathing.   CARDIOVASCULAR: S1, S2 regular rate and rhythm. No murmurs, rubs, or gallops.   ABDOMEN: Soft, nontender, nondistended. No hepatosplenomegaly. Normal bowel sounds.   EXTREMITIES: No pedal edema. No clubbing or cyanosis. 2+ dorsalis pedis pulses palpable bilaterally.   SKIN: No acne, rash, or lesions.   LYMPHATICS: No cervical lymphadenopathy.   NEUROLOGIC: Cranial nerves intact. No focal motor or sensory deficits.   PSYCH: Patient is awake, alert, oriented x3.   LABORATORY, DIAGNOSTIC AND RADIOLOGICAL DATA:  WBC 5.8, hemoglobin 12.5, hematocrit 37.2, platelet count 164.   Sodium 142, potassium 4.1, chloride 104, bicarbonate 27, BUN 26, creatinine 0.74, glucose 109, calcium 10.1.   ALT 26, AST 26, alkaline phosphatase 46, total bilirubin 0.4, albumin 4.5, magnesium 1.7, INR 0.9. Troponin less than 0.02.   Chest x-ray showing clear lung fields status post median sternotomy and coronary artery bypass graft surgery post surgical findings are present. There is no evidence of acute cardiopulmonary disease.   EKG showing normal sinus rhythm with no ST-T wave abnormalities. Old T wave inversion seen in V2 and V2.   ASSESSMENT AND PLAN: This is a 79 year old female with history of coronary artery disease status post  bypass graft surgery and drug-eluting stent in 2010 for 70% in-graft obtuse marginal stenosis comes in with chest pain, first troponin being negative. No EKG changes.  1. Unstable angina. Typical presentation for unstable angina. Will admit the patient to telemetry and recycle further cardiac enzymes. Get cardiology consult by Dr. Nehemiah Massed and echo for any wall motion abnormalities. Continue aspirin, nitro patch, she is already on Toprol, statin, lisinopril and Plavix. If her blood pressure permits might add Imdur for her angina symptoms.  2. Coronary artery disease. Management as mentioned above.  3. Diabetes mellitus. Continue her metformin. Place on sliding scale insulin while in the hospital and get hemoglobin A1c.   4. Hyperlipidemia. She is on Crestor.  5. GI and deep vein thrombosis prophylaxis. Placed on Prilosec and Lovenox.  6. CODE STATUS: FULL CODE.   TIME SPENT ON ADMISSION: 50 minutes.   ____________________________ Gladstone Lighter, MD rk:cms D: 10/07/2011 14:32:44 ET T: 10/07/2011 15:03:19 ET JOB#: [id number]  cc: Gladstone Lighter, MD, <Dictator> Youlanda Roys. Lovie Macadamia, MD Corey Skains, MD  Gladstone Lighter MD ELECTRONICALLY SIGNED 10/12/2011 14:45

## 2015-01-09 NOTE — Consult Note (Signed)
PATIENT NAME:  Tracey [name], Tracey [name] MR#:  [id number] DATE OF BIRTH:  September 02, 1936  DATE OF CONSULTATION:  10/07/2011  REFERRING PHYSICIAN:  Gladstone Lighter, MD CONSULTING PHYSICIAN:  Corey Skains, MD  REASON FOR CONSULTATION: Unstable angina.   CHIEF COMPLAINT: "I have chest pain."   HISTORY OF PRESENT ILLNESS: This is a 79 year old female with known cardiovascular disease status post previous coronary artery bypass graft, hypertension, and hyperlipidemia on appropriate medications with significant increase in shortness of breath and chest pressure. This came to the point where the patient had significant episodes of chest pain and pressure on admission. The patient had an EKG without current evidence of acute myocardial infarction. Troponin and CK-MB were within normal limits. The patient had previously been stable on appropriate medications for diabetes mellitus, hyperlipidemia, hypertension, and peripheral vascular disease, as listed below. She had a previous stent in her obtuse marginal graft which had previously been done in 2010.   REVIEW OF SYSTEMS: The remainder of review of systems is negative for vision change, ringing in the ears, hearing loss, cough, congestion, heartburn, nausea, vomiting, diarrhea, bloody stools, stomach pain, extremity pain, leg weakness, cramping of the buttocks, known blood clots, headaches, blackouts, dizzy spells, nosebleeds, congestion, trouble swallowing, frequent urination, urination at night, muscle weakness, numbness, anxiety, depression, skin lesions, or skin rashes.   PAST MEDICAL HISTORY:  1. Known coronary artery disease status post coronary artery bypass graft.  2. Atrial fibrillation.  3. Peripheral vascular disease with left renal artery stent.  4. Diabetes mellitus.  5. Hyperlipidemia.   FAMILY HISTORY: Her father died of coronary artery disease complications in his 58N. Mother is healthy.   SOCIAL HISTORY: The patient currently denies alcohol  or tobacco use.   DRUG ALLERGIES: Erythromycin.  CURRENT MEDICATIONS: As listed.   PHYSICAL EXAMINATION:   VITAL SIGNS: Blood pressure is 146/68 bilaterally and heart rate is 72 upright and reclining and regular.   GENERAL: She is a well appearing female in no acute distress.   HEENT: No icterus, thyromegaly, ulcers, hemorrhage, or xanthelasma.   HEART: Regular rate and rhythm. Normal S1 and S2 without murmur, gallop, or rub. Point of maximal impulse is normal size and placement. Carotid upstroke is normal without bruit. Jugular venous pressure is normal.   LUNGS: Lungs are clear to auscultation with normal respirations.   ABDOMEN: Soft and nontender without hepatosplenomegaly or masses. Abdominal aorta is normal size without bruit.   EXTREMITIES: 2+ bilateral pulses in dorsal, pedal, radial, and femoral arteries without lower extremity edema, cyanosis, clubbing, or ulcers.   NEUROLOGIC: She is oriented to time, place, and person with normal mood and affect.   ASSESSMENT: This is a 79 year old female with known coronary artery disease status post previous PCI and stent placement, coronary artery bypass graft, hypertension, hyperlipidemia, and diabetes mellitus with progressive episodes of chest pain consistent with unstable angina without evidence of myocardial infarction.   RECOMMENDATIONS:  1. Continue serial ECG and enzymes to assess for possible myocardial infarction.  2. Proceed to cardiac catheterization to assess coronary anatomy and further treatment thereof as necessary. The patient understands the risks and benefits of cardiac catheterization. This    includes the possibility of death, stroke, heart attack, infection, bleeding, or blood clot. She is at low risk for conscious sedation.  3. Further medication management of other risk factors listed below.  ____________________________ Corey Skains, MD bjk:slb D: 10/09/2011 08:50:00 ET T: 10/09/2011 10:08:36  ET JOB#: [id number]  cc: Corey Skains, MD, <Dictator>  Corey Skains MD ELECTRONICALLY SIGNED 11/02/2011 14:28

## 2015-01-10 ENCOUNTER — Ambulatory Visit
Admit: 2015-01-10 | Disposition: A | Discharge status: Home / Self Care | Payer: Self-pay | Attending: Ophthalmology | Admitting: Ophthalmology

## 2015-01-16 NOTE — Op Note (Addendum)
PATIENT NAME:  Tracey [name], OFFER MR#:  [id number] DATE OF BIRTH:  [birth year]  DATE OF PROCEDURE:  01/10/2015  PREOPERATIVE DIAGNOSIS:  Nuclear sclerotic cataract, right eye.   POSTOPERATIVE DIAGNOSIS:  Nuclear sclerotic cataract, right eye.   PROCEDURE PERFORMED:  Extracapsular cataract extraction using phacoemulsification with placement of an Alcon AU00T0 14.0-diopter posterior chamber lens, serial #[id number].  SURGEON:  Loura Back. Adyn Serna, MD  ASSISTANT:  None.  ANESTHESIA:  4% lidocaine and 0.75% Marcaine in a 50/50 mixture with 10 units/mL of Hylenex added, given as a peribulbar.   ANESTHESIOLOGIST:  Dr. Ronelle Nigh.  COMPLICATIONS:  None.  ESTIMATED BLOOD LOSS:  Less than 1 ml.  DESCRIPTION OF PROCEDURE:  The patient was brought to the operating room and given a peribulbar block.  The patient was then prepped and draped in the usual fashion.  The vertical rectus muscles were imbricated using 5-0 silk sutures.  These sutures were then clamped to the sterile drapes as bridle sutures.  A limbal peritomy was performed extending two clock hours and hemostasis was obtained with cautery.  A partial thickness scleral groove was made at the surgical limbus and dissected anteriorly in a lamellar dissection using an Alcon crescent knife.  The anterior chamber was entered superonasally with a Superblade and through the lamellar dissection with a 2.6 mm keratome.  DisCoVisc was used to replace the aqueous and a continuous tear capsulorrhexis was carried out.  Hydrodissection and hydrodelineation were carried out with balanced salt and a 27 gauge canula.  The nucleus was rotated to confirm the effectiveness of the hydrodissection.  Phacoemulsification was carried out using a divide-and-conquer technique.  Total ultrasound time was 1 minute and 22 seconds with an average power of 26.4 percent and CDE of 39.99.  Irrigation/aspiration was used to remove the residual cortex.  DisCoVisc was used to  inflate the capsule and the internal incision was enlarged to 3 mm with the crescent knife.  The intraocular lens was folded and inserted into the capsular bag using an UltraSert delivery system.  Irrigation/aspiration was used to remove the residual DisCoVisc.  Miostat was injected into the anterior chamber through the paracentesis track to inflate the anterior chamber and induce miosis. A tenth of a milliliter of cefuroxime containing 1 mg of drug was injected via the paracentesis tract. The wound was checked for leaks and none were found. The conjunctiva was closed with cautery and the bridle sutures were removed.  Two drops of 0.3% Vigamox were placed on the eye.   An eye shield was placed on the eye.  The patient was discharged to the recovery room in good condition.  ____________________________ Loura Back Krystale Rinkenberger, MD sad:sb D: 01/10/2015 13:41:04 ET T: 01/10/2015 17:05:09 ET JOB#: [id number]  cc: Remo Lipps A. Earnie Rockhold, MD, <Dictator> Martie Lee MD ELECTRONICALLY SIGNED 01/15/2015 12:17

## 2015-03-07 ENCOUNTER — Telehealth: Payer: Self-pay

## 2015-03-07 DIAGNOSIS — K219 Gastro-esophageal reflux disease without esophagitis: Secondary | ICD-10-CM | POA: Insufficient documentation

## 2015-03-07 DIAGNOSIS — R194 Change in bowel habit: Secondary | ICD-10-CM | POA: Insufficient documentation

## 2015-03-07 DIAGNOSIS — Z800 Family history of malignant neoplasm of digestive organs: Secondary | ICD-10-CM | POA: Insufficient documentation

## 2015-03-07 NOTE — Telephone Encounter (Signed)
Received cardiac clearance request from [facility] for pt to proceed w/ colonoscopy/EGD on 04/08/15 w/ Dr. Vira Agar. Per Christell Faith, PA, "pt may hold Plavix 5-7 days prior to procedure.  Continue aspirin 81 mg.  Restart Plavix 75 mg 24 hrs post procedure or when able per MD." Faxed to [phone].

## 2015-04-08 ENCOUNTER — Encounter: Admission: RE | Payer: Self-pay | Source: Ambulatory Visit

## 2015-04-08 ENCOUNTER — Ambulatory Visit
Admission: RE | Admit: 2015-04-08 | Payer: Medicare HMO | Source: Ambulatory Visit | Admitting: Unknown Physician Specialty

## 2015-04-08 SURGERY — COLONOSCOPY WITH PROPOFOL
Anesthesia: General

## 2015-05-24 ENCOUNTER — Ambulatory Visit: Payer: Self-pay | Admitting: Obstetrics and Gynecology

## 2015-06-01 ENCOUNTER — Ambulatory Visit (INDEPENDENT_AMBULATORY_CARE_PROVIDER_SITE_OTHER): Payer: Medicare HMO | Admitting: Obstetrics and Gynecology

## 2015-06-01 ENCOUNTER — Encounter: Payer: Self-pay | Admitting: Obstetrics and Gynecology

## 2015-06-01 VITALS — BP 135/60 | HR 62 | Wt 136.1 lb

## 2015-06-01 DIAGNOSIS — N842 Polyp of vagina: Secondary | ICD-10-CM | POA: Diagnosis not present

## 2015-06-01 NOTE — Progress Notes (Signed)
Subjective:     Patient ID: Tracey [name], female   DOB: [birth year], 79 y.o.   MRN: [id number]  HPI Here for 6 month follow-up of suspected vaginal polyp  Review of Systems Denies any vaginal discharge or pressure    Objective:   Physical Exam A&O x4 Well groomed female in no distress Pelvic exam: VULVA: normal appearing vulva with no masses, tenderness or lesions, VAGINA: normal appearing vagina with normal color and discharge, no lesions, with 1 cm hemangioma noted at 12 oclock just inside introitus (no change from previous exam).    Assessment:     Vaginal hemangioma/polyp     Plan:     Follow-up 1 year or as needed.  Melody Trudee Kuster, CNM

## 2015-07-01 ENCOUNTER — Encounter: Payer: Self-pay | Admitting: Cardiovascular Disease

## 2015-07-01 ENCOUNTER — Ambulatory Visit (INDEPENDENT_AMBULATORY_CARE_PROVIDER_SITE_OTHER): Payer: Medicare HMO | Admitting: Cardiovascular Disease

## 2015-07-01 VITALS — BP 140/60 | HR 58 | Ht 63.0 in | Wt 135.8 lb

## 2015-07-01 DIAGNOSIS — I209 Angina pectoris, unspecified: Secondary | ICD-10-CM

## 2015-07-01 DIAGNOSIS — I10 Essential (primary) hypertension: Secondary | ICD-10-CM

## 2015-07-01 DIAGNOSIS — Z951 Presence of aortocoronary bypass graft: Secondary | ICD-10-CM | POA: Diagnosis not present

## 2015-07-01 DIAGNOSIS — E785 Hyperlipidemia, unspecified: Secondary | ICD-10-CM

## 2015-07-01 DIAGNOSIS — E1169 Type 2 diabetes mellitus with other specified complication: Secondary | ICD-10-CM

## 2015-07-01 DIAGNOSIS — I25810 Atherosclerosis of coronary artery bypass graft(s) without angina pectoris: Secondary | ICD-10-CM | POA: Diagnosis not present

## 2015-07-01 MED ORDER — ROSUVASTATIN CALCIUM 40 MG PO TABS: 40.0000 mg | ORAL_TABLET | Freq: Every day | ORAL | Status: DC

## 2015-07-01 NOTE — Assessment & Plan Note (Signed)
We have encouraged continued exercise, careful diet management in an effort to lose weight. Currently diet is poor

## 2015-07-01 NOTE — Assessment & Plan Note (Signed)
Blood pressure is well controlled on today's visit. No changes made to the medications. 

## 2015-07-01 NOTE — Assessment & Plan Note (Signed)
No recent symptoms concerning for unstable angina. No further testing at this time Stressed the importance of aggressive cholesterol and diabetes control

## 2015-07-01 NOTE — Progress Notes (Signed)
Patient ID: Tracey [name], female    DOB: 24-Nov-1935, 79 y.o.   MRN: [id number]  HPI Comments: [name] is a very pleasant 79 year old woman  with a history of coronary artery disease, bypass surgery in 1995, diabetes with hemoglobin A1c 7.0,  stent to her graft to the OM several years ago with chest pain January 2013 and repeat stenting to the graft to the OM with a Xience 3.5 x 18 mm stent (patient and husband report that she required delivery of a shock for  resuscitation during the procedure), also with left renal PCI in 1998, redo PCI March 2010 followed by [name], who presents for routine followup of her coronary artery disease  She recently lost her husband  In follow-up, she reports that she is doing okay. Not sleeping, continues to adjust after the loss of her husband several months ago She has tried over-the-counter medications such as Unisom which made her feel sleepy the next morning She was concerned about Tylenol PM in effect on her liver She is active, does some exercise regularly Denies any symptoms concerning for angina, no lower extremity edema or shortness of breath on exertion  Prior lab work reviewed with her showing total cholesterol 170s, hemoglobin A1c greater than 7  EKG on today's visit shows normal sinus rhythm with rate 58 bpm, no significant ST or T-wave changes  Other past medical history  She is very active and takes care of a large family.  She had recent right knee replacement in July 2014 with good recovery.  Details from her bypass surgery showed LIMA to the LAD, vein graft to the PDA and vein graft to the OM Details from the cardiac catheterization January 2013 show proximal LAD occlusion, proximal left circumflex occlusion, proximal RCA occlusion, graft to the OM with 90% disease proximally. DES stent was placed.  Echocardiogram dated January 2012 shows normal systolic function, mild MR and TR   Allergies  Allergen Reactions  . Erythromycin      Outpatient Encounter Prescriptions as of 07/01/2015  Medication Sig  . aspirin 81 MG tablet Take 81 mg by mouth daily.   . Calcium Carbonate-Vit D-Min (CALTRATE 600+D PLUS PO) Take 200 Units by mouth daily.  . clopidogrel (PLAVIX) 75 MG tablet Take 1 tablet (75 mg total) by mouth once.  Marland Kitchen lisinopril (PRINIVIL,ZESTRIL) 20 MG tablet Take 20 mg by mouth daily.  . metFORMIN (GLUCOPHAGE) 1000 MG tablet TAKE 1 TABLET BY MOUTH TWICE DAILY  . metoprolol succinate (TOPROL-XL) 25 MG 24 hr tablet Take 1 tablet (25 mg total) by mouth daily. (Patient taking differently: Take 12.5 mg by mouth daily. )  . Multiple Vitamins-Minerals (MULTIVITAMIN WITH MINERALS) tablet Take 1 tablet by mouth daily.  . nitroGLYCERIN (NITROSTAT) 0.4 MG SL tablet Place 1 tablet (0.4 mg total) under the tongue every 5 (five) minutes as needed for chest pain.  . rosuvastatin (CRESTOR) 40 MG tablet Take 1 tablet (40 mg total) by mouth daily.  . [DISCONTINUED] rosuvastatin (CRESTOR) 40 MG tablet Take 1 tablet (40 mg total) by mouth daily.  . [DISCONTINUED] metFORMIN (GLUCOPHAGE) 500 MG tablet Take 1,000 mg by mouth 2 (two) times daily with a meal.   . [DISCONTINUED] metoprolol tartrate (LOPRESSOR) 25 MG tablet Take 1 tablet by mouth daily.   No facility-administered encounter medications on file as of 07/01/2015.    Past Medical History  Diagnosis Date  . Hyperlipidemia   . Diabetes mellitus     non insulin dependent  .  Coronary artery disease   . Atrial fibrillation ([place])     history  . Peripheral vascular disease ([place])   . Hypertension     Past Surgical History  Procedure Laterality Date  . Cardiac catheterization    . Coronary artery bypass graft      with LIMA  to the LAD, SVG to OM1, SVG to PDA  . Appendectomy    . Tonsillectomy    . Renal artery stent      left, By Dr Hulda Humphrey  . Replacement total knee  03/25/2013    right  . Coronary angioplasty with stent placement      Social History  reports that she  has never smoked. She does not have any smokeless tobacco history on file. She reports that she does not drink alcohol or use illicit drugs.  Family History family history includes Heart disease in her father; Other in her brother, brother, and brother.   Review of Systems  Constitutional: Negative.   Respiratory: Negative.   Cardiovascular: Negative.   Gastrointestinal: Negative.   Musculoskeletal: Positive for joint swelling and arthralgias.  Neurological: Negative.   Hematological: Negative.   Psychiatric/Behavioral: Negative.   All other systems reviewed and are negative.   BP 140/60 mmHg  Pulse 58  Ht 5\' 3"  (1.6 m)  Wt 135 lb 12 oz (61.576 kg)  BMI 24.05 kg/m2  Physical Exam  Constitutional: She is oriented to person, place, and time. She appears well-developed and well-nourished.  HENT:  Head: Normocephalic.  Nose: Nose normal.  Mouth/Throat: Oropharynx is clear and moist.  Eyes: Conjunctivae are normal. Pupils are equal, round, and reactive to light.  Neck: Normal range of motion. Neck supple. No JVD present.  Cardiovascular: Normal rate, regular rhythm, S1 normal, S2 normal and intact distal pulses.  Exam reveals no gallop and no friction rub.   Murmur heard.  Systolic murmur is present with a grade of 2/6  Pulmonary/Chest: Effort normal and breath sounds normal. No respiratory distress. She has no wheezes. She has no rales. She exhibits no tenderness.  Abdominal: Soft. Bowel sounds are normal. She exhibits no distension. There is no tenderness.  Musculoskeletal: Normal range of motion. She exhibits no edema or tenderness.  Lymphadenopathy:    She has no cervical adenopathy.  Neurological: She is alert and oriented to person, place, and time. Coordination normal.  Skin: Skin is warm and dry. No rash noted. No erythema.  Psychiatric: She has a normal mood and affect. Her behavior is normal. Judgment and thought content normal.    Assessment and Plan  Nursing note  and vitals reviewed.

## 2015-07-01 NOTE — Patient Instructions (Addendum)
You are doing well. No medication changes were made.  Goal HBA1C is <7 Goal total cholesterol <150 Goal LDL <70  Please increase the crestor up to 40 mg daily  Please call us if you have new issues that need to be addressed before your next appt.  Your physician wants you to follow-up in: 6 months.  You will receive a reminder letter in the mail two months in advance. If you don't receive a letter, please call our office to schedule the follow-up appointment.  Cholesterol Cholesterol is a white, waxy, fat-like substance needed by your body in small amounts. The liver makes all the cholesterol you need. Cholesterol is carried from the liver by the blood through the blood vessels. Deposits of cholesterol (plaque) may build up on blood vessel walls. These make the arteries narrower and stiffer. Cholesterol plaques increase the risk for heart attack and stroke.  You cannot feel your cholesterol level even if it is very high. The only way to know it is high is with a blood test. Once you know your cholesterol levels, you should keep a record of the test results. Work with your health care provider to keep your levels in the desired range.  WHAT DO THE RESULTS MEAN?  Total cholesterol is a rough measure of all the cholesterol in your blood.   LDL is the so-called bad cholesterol. This is the type that deposits cholesterol in the walls of the arteries. You want this level to be low.   HDL is the good cholesterol because it cleans the arteries and carries the LDL away. You want this level to be high.  Triglycerides are fat that the body can either burn for energy or store. High levels are closely linked to heart disease.  WHAT ARE THE DESIRED LEVELS OF CHOLESTEROL?  Total cholesterol below 200.   LDL below 100 for people at risk, below 70 for those at very high risk.   HDL above 50 is good, above 60 is best.   Triglycerides below 150.  HOW CAN I LOWER MY CHOLESTEROL?  Diet. Follow  your diet programs as directed by your health care provider.   Choose fish or white meat chicken and [place], roasted or baked. Limit fatty cuts of red meat, fried foods, and processed meats, such as sausage and lunch meats.   Eat lots of fresh fruits and vegetables.  Choose whole grains, beans, pasta, potatoes, and cereals.   Use only small amounts of olive, corn, or canola oils.   Avoid butter, mayonnaise, shortening, or palm kernel oils.  Avoid foods with trans fats.   Drink skim or nonfat milk and eat low-fat or nonfat yogurt and cheeses. Avoid whole milk, cream, ice cream, egg yolks, and full-fat cheeses.   Healthy desserts include angel food cake, ginger snaps, animal crackers, hard candy, popsicles, and low-fat or nonfat frozen yogurt. Avoid pastries, cakes, pies, and cookies.   Exercise. Follow your exercise programs as directed by your health care provider.   A regular program helps decrease LDL and raise HDL.   A regular program helps with weight control.   Do things that increase your activity level like gardening, walking, or taking the stairs. Ask your health care provider about how you can be more active in your daily life.   Medicine. Take medicine only as directed by your health care provider.   Medicine may be prescribed by your health care provider to help lower cholesterol and decrease the risk for heart disease.  If you have several risk factors, you may need medicine even if your levels are normal.   This information is not intended to replace advice given to you by your health care provider. Make sure you discuss any questions you have with your health care provider.   Document Released: 05/29/2001 Document Revised: 09/24/2014 Document Reviewed: 06/17/2013 Elsevier Interactive Patient Education 2016 Elsevier Inc. Hemoglobin A1c Test Some of the sugar (glucose) that circulates in your blood sticks or binds to blood proteins. Hemoglobin (Hb or  Hgb) is one type of blood protein that glucose binds to. It also carries oxygen in the red blood cells (RBCs). When glucose binds to Hb, the glucose-coated Hb is called glycated Hb. Once Hb is glycated, it remains that way for the life of the RBC. This is about 120 days. Rather than testing your blood glucose level on one single day, the hemoglobin A1c (HbA1c) test measures the average amount of glycated hemoglobin and, therefore, the average amount of glucose in your blood during the 3-4 months just before the test is done. The HbA1c test is used to monitor long-term control of blood sugar in people who have diabetes mellitus. The HbA1c test can also be used in addition to or in combination with fasting blood glucose level and oral glucose tolerance tests. RESULTS It is your responsibility to obtain your test results. Ask the lab or department performing the test when and how you will get your results. Contact your health care provider to discuss any questions you have about your results. Range of Normal Values Ranges for normal values may vary among different labs and hospitals. You should always check with your health care provider after having lab work or other tests done to discuss the meaning of your test results and whether your values are considered within normal limits. The ranges for normal HbA1c test results are as follows:  Adult or child without diabetes: 4-5.9%.  Adult or child with diabetes and good blood glucose control: less than 6.5%. Several factors can affect HbA1c test results. These may include:  Diseases (hemoglobinopathies) that cause a change in the shape, size, or amount of Hb in your blood.  Longer than normal RBC life span.  Abnormally low levels of certain proteins in your blood.  Eating foods or taking supplements that are high in vitamin C (ascorbic acid). Meaning of Results Outside Normal Value Ranges Abnormally high HbA1c values are most commonly an indication of  prediabetes mellitus and diabetes mellitus:  An HbA1c result of 5.7-6.4% is considered diagnostic of prediabetes mellitus.  An HbA1c result of 6.5% or higher on two separate occasions is considered diagnostic of diabetes mellitus. Abnormally low HbA1c values can be caused by several health conditions. These may include:  Pregnancy.  A large amount of blood loss.  Blood transfusions.  Low red blood cell count (anemia). This is caused by premature destruction of red blood cells.  Long-term kidney failure.  Some unusual forms of Hb (Hb variants), such as sickle cell trait. Discuss your test results with your health care provider. He or she will use the results to make a diagnosis and determine a treatment plan that is right for you.   This information is not intended to replace advice given to you by your health care provider. Make sure you discuss any questions you have with your health care provider.   Document Released: 09/25/2004 Document Revised: 09/24/2014 Document Reviewed: 01/18/2014 Elsevier Interactive Patient Education [employer].

## 2015-07-01 NOTE — Assessment & Plan Note (Signed)
Recommended she increase her Crestor up to 40 mg daily to reach goal total cholesterol less than 150 She will follow up with primary care for routine blood work

## 2015-07-01 NOTE — Assessment & Plan Note (Signed)
Currently with no symptoms of angina. No further workup at this time. Continue current medication regimen. 

## 2015-07-25 ENCOUNTER — Other Ambulatory Visit: Payer: Self-pay | Admitting: Cardiovascular Disease

## 2015-08-08 ENCOUNTER — Encounter: Payer: Self-pay | Admitting: *Deleted

## 2015-08-15 ENCOUNTER — Ambulatory Visit: Payer: Medicare HMO | Admitting: *Deleted

## 2015-08-15 ENCOUNTER — Encounter: Payer: Self-pay | Admitting: *Deleted

## 2015-08-15 ENCOUNTER — Encounter
Admission: RE | Disposition: A | Discharge status: Home / Self Care | Payer: Self-pay | Source: Ambulatory Visit | Attending: Ophthalmology

## 2015-08-15 ENCOUNTER — Ambulatory Visit
Admission: RE | Admit: 2015-08-15 | Discharge: 2015-08-15 | Disposition: A | Discharge status: Home / Self Care | Payer: Medicare HMO | Source: Ambulatory Visit | Attending: Ophthalmology | Admitting: Ophthalmology

## 2015-08-15 DIAGNOSIS — H2512 Age-related nuclear cataract, left eye: Secondary | ICD-10-CM | POA: Insufficient documentation

## 2015-08-15 DIAGNOSIS — E119 Type 2 diabetes mellitus without complications: Secondary | ICD-10-CM | POA: Diagnosis not present

## 2015-08-15 DIAGNOSIS — E7800 Pure hypercholesterolemia, unspecified: Secondary | ICD-10-CM | POA: Insufficient documentation

## 2015-08-15 DIAGNOSIS — Z96659 Presence of unspecified artificial knee joint: Secondary | ICD-10-CM | POA: Insufficient documentation

## 2015-08-15 DIAGNOSIS — Z9842 Cataract extraction status, left eye: Secondary | ICD-10-CM | POA: Diagnosis not present

## 2015-08-15 DIAGNOSIS — I10 Essential (primary) hypertension: Secondary | ICD-10-CM | POA: Diagnosis not present

## 2015-08-15 DIAGNOSIS — I2510 Atherosclerotic heart disease of native coronary artery without angina pectoris: Secondary | ICD-10-CM | POA: Insufficient documentation

## 2015-08-15 DIAGNOSIS — K219 Gastro-esophageal reflux disease without esophagitis: Secondary | ICD-10-CM | POA: Insufficient documentation

## 2015-08-15 DIAGNOSIS — Z880 Allergy status to penicillin: Secondary | ICD-10-CM | POA: Insufficient documentation

## 2015-08-15 DIAGNOSIS — Z888 Allergy status to other drugs, medicaments and biological substances status: Secondary | ICD-10-CM | POA: Insufficient documentation

## 2015-08-15 DIAGNOSIS — Z955 Presence of coronary angioplasty implant and graft: Secondary | ICD-10-CM | POA: Insufficient documentation

## 2015-08-15 DIAGNOSIS — Z951 Presence of aortocoronary bypass graft: Secondary | ICD-10-CM | POA: Diagnosis not present

## 2015-08-15 DIAGNOSIS — R011 Cardiac murmur, unspecified: Secondary | ICD-10-CM | POA: Diagnosis not present

## 2015-08-15 HISTORY — DX: Cardiac murmur, unspecified: R01.1

## 2015-08-15 HISTORY — PX: CATARACT EXTRACTION W/PHACO: SHX586

## 2015-08-15 HISTORY — DX: Gastro-esophageal reflux disease without esophagitis: K21.9

## 2015-08-15 HISTORY — DX: Anxiety disorder, unspecified: F41.9

## 2015-08-15 LAB — GLUCOSE, CAPILLARY: Glucose-Capillary: 133 mg/dL — ABNORMAL HIGH (ref 65–99)

## 2015-08-15 SURGERY — PHACOEMULSIFICATION, CATARACT, WITH IOL INSERTION
Anesthesia: Monitor Anesthesia Care | Site: Eye | Laterality: Left | Wound class: Clean

## 2015-08-15 MED ORDER — LIDOCAINE HCL (PF) 4 % IJ SOLN
INTRAMUSCULAR | Status: AC
  Filled 2015-08-15: qty 10

## 2015-08-15 MED ORDER — PHENYLEPHRINE HCL 10 % OP SOLN
OPHTHALMIC | Status: AC
  Administered 2015-08-15: 1 [drp] via OPHTHALMIC
  Filled 2015-08-15: qty 5

## 2015-08-15 MED ORDER — LIDOCAINE HCL (PF) 4 % IJ SOLN
INTRAOCULAR | Status: DC | PRN
  Administered 2015-08-15: .1 mL via OPHTHALMIC

## 2015-08-15 MED ORDER — ALFENTANIL 500 MCG/ML IJ INJ
INJECTION | INTRAMUSCULAR | Status: DC | PRN
  Administered 2015-08-15: 500 ug via INTRAVENOUS

## 2015-08-15 MED ORDER — BUPIVACAINE HCL (PF) 0.75 % IJ SOLN
INTRAMUSCULAR | Status: AC
  Filled 2015-08-15: qty 10

## 2015-08-15 MED ORDER — TETRACAINE HCL 0.5 % OP SOLN
OPHTHALMIC | Status: DC | PRN
  Administered 2015-08-15: 1 [drp] via OPHTHALMIC

## 2015-08-15 MED ORDER — MIDAZOLAM HCL 2 MG/2ML IJ SOLN
INTRAMUSCULAR | Status: DC | PRN
  Administered 2015-08-15: .5 mg via INTRAVENOUS
  Administered 2015-08-15: 0.5 mg via INTRAVENOUS

## 2015-08-15 MED ORDER — CYCLOPENTOLATE HCL 2 % OP SOLN
1.0000 [drp] | OPHTHALMIC | Status: AC | PRN
  Administered 2015-08-15 (×4): 1 [drp] via OPHTHALMIC

## 2015-08-15 MED ORDER — CYCLOPENTOLATE HCL 2 % OP SOLN
OPHTHALMIC | Status: AC
  Administered 2015-08-15: 1 [drp] via OPHTHALMIC
  Filled 2015-08-15: qty 2

## 2015-08-15 MED ORDER — MOXIFLOXACIN HCL 0.5 % OP SOLN
OPHTHALMIC | Status: DC | PRN
  Administered 2015-08-15: 1 [drp] via OPHTHALMIC

## 2015-08-15 MED ORDER — LIDOCAINE HCL (PF) 4 % IJ SOLN
INTRAMUSCULAR | Status: DC | PRN
  Administered 2015-08-15: 4 mL via OPHTHALMIC

## 2015-08-15 MED ORDER — MOXIFLOXACIN HCL 0.5 % OP SOLN
OPHTHALMIC | Status: AC
  Administered 2015-08-15: 1 [drp] via OPHTHALMIC
  Filled 2015-08-15: qty 3

## 2015-08-15 MED ORDER — NA CHONDROIT SULF-NA HYALURON 40-17 MG/ML IO SOLN
INTRAOCULAR | Status: AC
  Filled 2015-08-15: qty 1

## 2015-08-15 MED ORDER — PHENYLEPHRINE HCL 10 % OP SOLN
1.0000 [drp] | OPHTHALMIC | Status: AC | PRN
  Administered 2015-08-15 (×4): 1 [drp] via OPHTHALMIC

## 2015-08-15 MED ORDER — CARBACHOL 0.01 % IO SOLN
INTRAOCULAR | Status: DC | PRN
  Administered 2015-08-15: .5 mL via INTRAOCULAR

## 2015-08-15 MED ORDER — EPINEPHRINE HCL 1 MG/ML IJ SOLN
INTRAMUSCULAR | Status: AC
  Filled 2015-08-15: qty 2

## 2015-08-15 MED ORDER — MOXIFLOXACIN HCL 0.5 % OP SOLN
1.0000 [drp] | OPHTHALMIC | Status: AC | PRN
  Administered 2015-08-15 (×3): 1 [drp] via OPHTHALMIC

## 2015-08-15 MED ORDER — SODIUM CHLORIDE 0.9 % IV SOLN
INTRAVENOUS | Status: DC
  Administered 2015-08-15: 07:00:00 via INTRAVENOUS

## 2015-08-15 MED ORDER — NA CHONDROIT SULF-NA HYALURON 40-17 MG/ML IO SOLN
INTRAOCULAR | Status: DC | PRN
  Administered 2015-08-15: 1 mL via INTRAOCULAR

## 2015-08-15 MED ORDER — TETRACAINE HCL 0.5 % OP SOLN
OPHTHALMIC | Status: AC
  Filled 2015-08-15: qty 2

## 2015-08-15 MED ORDER — EPINEPHRINE HCL 1 MG/ML IJ SOLN
INTRAOCULAR | Status: DC | PRN
  Administered 2015-08-15: 1 mL via OPHTHALMIC

## 2015-08-15 MED ORDER — CEFUROXIME OPHTHALMIC INJECTION 1 MG/0.1 ML
INJECTION | OPHTHALMIC | Status: AC
  Filled 2015-08-15: qty 0.1

## 2015-08-15 MED ORDER — HYALURONIDASE HUMAN 150 UNIT/ML IJ SOLN
INTRAMUSCULAR | Status: AC
  Filled 2015-08-15: qty 1

## 2015-08-15 SURGICAL SUPPLY — 31 items
CANNULA ANT/CHMB 27G (MISCELLANEOUS) ×1 IMPLANT
CANNULA ANT/CHMB 27GA (MISCELLANEOUS) ×2 IMPLANT
CORD BIP STRL DISP 12FT (MISCELLANEOUS) ×2 IMPLANT
CUP MEDICINE 2OZ PLAST GRAD ST (MISCELLANEOUS) ×2 IMPLANT
DRAPE XRAY CASSETTE 23X24 (DRAPES) ×2 IMPLANT
ERASER HMR WETFIELD 18G (MISCELLANEOUS) ×2 IMPLANT
GLOVE BIO SURGEON STRL SZ8 (GLOVE) ×2 IMPLANT
GLOVE SURG LX 6.5 MICRO (GLOVE) ×1
GLOVE SURG LX 8.0 MICRO (GLOVE) ×1
GLOVE SURG LX STRL 6.5 MICRO (GLOVE) ×1 IMPLANT
GLOVE SURG LX STRL 8.0 MICRO (GLOVE) ×1 IMPLANT
GOWN STRL REUS W/ TWL LRG LVL3 (GOWN DISPOSABLE) ×1 IMPLANT
GOWN STRL REUS W/ TWL XL LVL3 (GOWN DISPOSABLE) ×1 IMPLANT
GOWN STRL REUS W/TWL LRG LVL3 (GOWN DISPOSABLE) ×2
GOWN STRL REUS W/TWL XL LVL3 (GOWN DISPOSABLE) ×2
LENS IOL ACRSF IQ ULTRA 14.5 (Intraocular Lens) IMPLANT
LENS IOL ACRYSOF IQ 14.5 (Intraocular Lens) ×2 IMPLANT
PACK CATARACT (MISCELLANEOUS) ×2 IMPLANT
PACK CATARACT DINGLEDEIN LX (MISCELLANEOUS) ×2 IMPLANT
PACK EYE AFTER SURG (MISCELLANEOUS) ×2 IMPLANT
SHLD EYE VISITEC  UNIV (MISCELLANEOUS) ×2 IMPLANT
SOL BSS BAG (MISCELLANEOUS) ×2
SOL PREP PVP 2OZ (MISCELLANEOUS) ×2
SOLUTION BSS BAG (MISCELLANEOUS) ×1 IMPLANT
SOLUTION PREP PVP 2OZ (MISCELLANEOUS) ×1 IMPLANT
SUT SILK 5-0 (SUTURE) ×2 IMPLANT
SYR 3ML LL SCALE MARK (SYRINGE) ×2 IMPLANT
SYR 5ML LL (SYRINGE) ×2 IMPLANT
SYR TB 1ML 27GX1/2 LL (SYRINGE) ×2 IMPLANT
WATER STERILE IRR 1000ML POUR (IV SOLUTION) ×2 IMPLANT
WIPE NON LINTING 3.25X3.25 (MISCELLANEOUS) ×2 IMPLANT

## 2015-08-15 NOTE — Interval H&P Note (Signed)
History and Physical Interval Note:  08/15/2015 7:22 AM  Tracey [name]  has presented today for surgery, with the diagnosis of CATARACT  The various methods of treatment have been discussed with the patient and family. After consideration of risks, benefits and other options for treatment, the patient has consented to  Procedure(s): CATARACT EXTRACTION PHACO AND INTRAOCULAR LENS PLACEMENT ([place]) (Left) as a surgical intervention .  The patient's history has been reviewed, patient examined, no change in status, stable for surgery.  I have reviewed the patient's chart and labs.  Questions were answered to the patient's satisfaction.     Tracey [name]

## 2015-08-15 NOTE — Op Note (Signed)
Date of Surgery: 08/15/2015 Date of Dictation: 08/15/2015 8:54 AM Pre-operative Diagnosis:  Nuclear Sclerotic Cataract left Eye Post-operative Diagnosis: same Procedure performed: Extra-capsular Cataract Extraction (ECCE) with placement of a posterior chamber intraocular lens (IOL) left Eye IOL:  Implant Name Type Inv. Item Serial No. Manufacturer Lot No. LRB No. Used  LENS IOL ACRYSOF IQ 14.5 - [id number] Intraocular Lens LENS IOL ACRYSOF IQ 14.5 [id number] ALCON   Left 1   Anesthesia: 2% Lidocaine and 4% Marcaine in a 50/50 mixture with 10 unites/ml of Hylenex given as a peribulbar Anesthesiologist: Anesthesiologist: Elyse Hsu, MD CRNA: Jonna Clark, CRNA Complications: none Estimated Blood Loss: less than 1 ml  Description of procedure:  The patient was given anesthesia and sedation via intravenous access. The patient was then prepped and draped in the usual fashion. A 25-gauge needle was bent for initiating the capsulorhexis. A 5-0 silk suture was placed through the conjunctiva superior and inferiorly to serve as bridle sutures. Hemostasis was obtained at the superior limbus using an eraser cautery. A partial thickness groove was made at the anterior surgical limbus with a 64 [place]aver blade and this was dissected anteriorly with an [employer]aya. The anterior chamber was entered at 10 o'clock with a 1.0 mm paracentesis knife and through the lamellar dissection with a 2.6 mm Alcon keratome. Epi-Shugarcaine 0.5 CC [9 cc BSS Plus (Alcon), 3 cc 4% preservative-free lidocaine (Hospira) and 4 cc 1:1000 preservative-free, bisulfite-free epinephrine] was injected into the anterior chamber via the paracentesis tract. Epi-Shugarcaine 0.5 CC [9 cc BSS Plus (Alcon), 3 cc 4% preservative-free lidocaine (Hospira) and 4 cc 1:1000 preservative-free, bisulfite-free epinephrine] was injected into the anterior chamber via the paracentesis tract. DiscoVisc was injected to replace the aqueous and a  continuous tear curvilinear capsulorhexis was performed using a bent 25-gauge needle.  Balance salt on a syringe was used to perform hydro-dissection and phacoemulsification was carried out using a divide and conquer technique. Procedure(s) with comments: CATARACT EXTRACTION PHACO AND INTRAOCULAR LENS PLACEMENT ([facility]) (Left) - [place] 01:28 AP% 26.4 CDE 35.73 fluid pack # [id number]. Irrigation/aspiration was used to remove the residual cortex and the capsular bag was inflated with DiscoVisc. The intraocular lens was inserted into the capsular bag using a pre-loaded UltraSert Delivery System. Irrigation/aspiration was used to remove the residual DiscoVisc. The wound was inflated with balanced salt and checked for leaks. None were found. Miostat was injected via the paracentesis track and 0.1 ml of Vigamox containing 1 mg of drug  was injected via the paracentesis track. The wound was checked for leaks again and none were found.   The bridal sutures were removed and two drops of Vigamox were placed on the eye. An eye shield was placed to protect the eye and the patient was discharged to the recovery area in good condition.   Deeksha Cotrell MD

## 2015-08-15 NOTE — Anesthesia Postprocedure Evaluation (Signed)
Anesthesia Post Note  Patient: Tracey [name]  Procedure(s) Performed: Procedure(s) (LRB): CATARACT EXTRACTION PHACO AND INTRAOCULAR LENS PLACEMENT ([facility]) (Left)  Patient location during evaluation: Short Stay Anesthesia Type: MAC Level of consciousness: awake, awake and alert and oriented Pain management: pain level controlled Vital Signs Assessment: post-procedure vital signs reviewed and stable Respiratory status: spontaneous breathing Cardiovascular status: stable Postop Assessment: No signs of nausea or vomiting Anesthetic complications: no    Last Vitals:  Filed Vitals:   08/15/15 0608 08/15/15 0807  BP: 139/47 142/39  Pulse: 52 72  Temp: 36.6 C 36.7 C  Resp: 18 16    Last Pain: There were no vitals filed for this visit.               Lanora Manis

## 2015-08-15 NOTE — Transfer of Care (Signed)
Immediate Anesthesia Transfer of Care Note  Patient: Tracey [name]  Procedure(s) Performed: Procedure(s) with comments: CATARACT EXTRACTION PHACO AND INTRAOCULAR LENS PLACEMENT ([place]) (Left) - [place] 01:28 AP% 26.4 CDE 35.73 fluid pack # [id number]  Patient Location: PACU and Short Stay  Anesthesia Type:MAC combined with regional for post-op pain  Level of Consciousness: awake and alert   Airway & Oxygen Therapy: Patient Spontanous Breathing  Post-op Assessment: Report given to RN and Post -op Vital signs reviewed and stable  Post vital signs: Reviewed and stable  Last Vitals:  Filed Vitals:   08/15/15 0608 08/15/15 0807  BP: 139/47 142/39  Pulse: 52   Temp: 36.6 C 37.3 C  Resp: 18 18    Complications: No apparent anesthesia complications

## 2015-08-15 NOTE — Anesthesia Preprocedure Evaluation (Signed)
Anesthesia Evaluation  Patient identified by MRN, date of birth, ID band Patient awake    Reviewed: Allergy & Precautions, NPO status , Patient's Chart, lab work & pertinent test results  Airway Mallampati: I  TM Distance: >3 FB Neck ROM: Limited    Dental  (+) Partial Lower, Partial Upper   Pulmonary    Pulmonary exam normal        Cardiovascular Exercise Tolerance: Good hypertension, + angina with exertion + CAD  Normal cardiovascular exam  CABG   Neuro/Psych    GI/Hepatic GERD  Medicated and Controlled,  Endo/Other  diabetes, Type 2BG 133.  Renal/GU      Musculoskeletal   Abdominal (+)  Abdomen: soft.    Peds  Hematology   Anesthesia Other Findings   Reproductive/Obstetrics                             Anesthesia Physical Anesthesia Plan  ASA: III  Anesthesia Plan: MAC   Post-op Pain Management:    Induction: Intravenous  Airway Management Planned:   Additional Equipment:   Intra-op Plan:   Post-operative Plan:   Informed Consent: I have reviewed the patients History and Physical, chart, labs and discussed the procedure including the risks, benefits and alternatives for the proposed anesthesia with the patient or authorized representative who has indicated his/her understanding and acceptance.     Plan Discussed with: CRNA  Anesthesia Plan Comments:         Anesthesia Quick Evaluation

## 2015-08-15 NOTE — Anesthesia Postprocedure Evaluation (Signed)
Anesthesia Post Note  Patient: Tracey [name]  Procedure(s) Performed: Procedure(s) (LRB): CATARACT EXTRACTION PHACO AND INTRAOCULAR LENS PLACEMENT ([facility]) (Left)  Patient location during evaluation: PACU Anesthesia Type: MAC Level of consciousness: awake, awake and alert and oriented Vital Signs Assessment: post-procedure vital signs reviewed and stable Respiratory status: spontaneous breathing Cardiovascular status: blood pressure returned to baseline Postop Assessment: No headache Anesthetic complications: no    Last Vitals:  Filed Vitals:   08/15/15 0608 08/15/15 0807  BP: 139/47 142/39  Pulse: 52 72  Temp: 36.6 C 36.7 C  Resp: 18 16    Last Pain: There were no vitals filed for this visit.               Derwin Reddy M

## 2015-08-15 NOTE — H&P (Signed)
See scanned note.

## 2015-08-15 NOTE — Discharge Instructions (Addendum)
Eye Surgery Discharge Instructions  Expect mild scratchy sensation or mild soreness. DO NOT RUB YOUR EYE!  The day of surgery:  Minimal physical activity, but bed rest is not required  No reading, computer work, or close hand work  No bending, lifting, or straining.  May watch TV  For 24 hours:  No driving, legal decisions, or alcoholic beverages  Safety precautions  Eat anything you prefer: It is better to start with liquids, then soup then solid foods.  _____ Eye patch should be worn until postoperative exam tomorrow.  ____ Solar shield eyeglasses should be worn for comfort in the sunlight/patch while sleeping  Resume all regular medications including aspirin or Coumadin if these were discontinued prior to surgery. You may shower, bathe, shave, or wash your hair. Tylenol may be taken for mild discomfort.  Call your doctor if you experience significant pain, nausea, or vomiting, fever > 101 or other signs of infection. [phone] or [phone] Specific instructions:  Follow-up Information    Follow up with Estill Cotta, MD.   Specialty:  Ophthalmology   Why:  08/16/15 @ 10:10 am   Contact information:   [address]   [place] [address] [phone]

## 2015-10-24 ENCOUNTER — Other Ambulatory Visit: Payer: Self-pay

## 2015-10-24 MED ORDER — CLOPIDOGREL BISULFATE 75 MG PO TABS: 75.0000 mg | ORAL_TABLET | Freq: Every day | ORAL | Status: DC

## 2015-12-16 ENCOUNTER — Other Ambulatory Visit
Admission: RE | Admit: 2015-12-16 | Discharge: 2015-12-16 | Disposition: A | Discharge status: Home / Self Care | Payer: Medicare HMO | Source: Ambulatory Visit | Attending: Nurse Practitioner | Admitting: Nurse Practitioner

## 2015-12-16 DIAGNOSIS — R197 Diarrhea, unspecified: Secondary | ICD-10-CM | POA: Insufficient documentation

## 2015-12-16 LAB — GASTROINTESTINAL PANEL BY PCR, STOOL (REPLACES STOOL CULTURE)

## 2015-12-16 LAB — C DIFFICILE QUICK SCREEN W PCR REFLEX
C Diff antigen: NEGATIVE
C Diff interpretation: NEGATIVE
C Diff toxin: NEGATIVE

## 2015-12-21 LAB — PANCREATIC ELASTASE, FECAL: Pancreatic Elastase-1, Stool: 460 ug Elast./g (ref >200)

## 2016-01-05 ENCOUNTER — Encounter: Payer: Self-pay | Admitting: Cardiovascular Disease

## 2016-01-05 ENCOUNTER — Ambulatory Visit (INDEPENDENT_AMBULATORY_CARE_PROVIDER_SITE_OTHER): Payer: Medicare HMO | Admitting: Cardiovascular Disease

## 2016-01-05 VITALS — BP 122/58 | HR 58 | Ht 63.0 in | Wt 136.5 lb

## 2016-01-05 DIAGNOSIS — E785 Hyperlipidemia, unspecified: Secondary | ICD-10-CM

## 2016-01-05 DIAGNOSIS — R197 Diarrhea, unspecified: Secondary | ICD-10-CM

## 2016-01-05 DIAGNOSIS — I25810 Atherosclerosis of coronary artery bypass graft(s) without angina pectoris: Secondary | ICD-10-CM

## 2016-01-05 DIAGNOSIS — E1159 Type 2 diabetes mellitus with other circulatory complications: Secondary | ICD-10-CM

## 2016-01-05 DIAGNOSIS — Z951 Presence of aortocoronary bypass graft: Secondary | ICD-10-CM | POA: Diagnosis not present

## 2016-01-05 DIAGNOSIS — I10 Essential (primary) hypertension: Secondary | ICD-10-CM

## 2016-01-05 NOTE — Progress Notes (Signed)
Patient ID: Tracey [name], female    DOB: 12-30-1935, 80 y.o.   MRN: [id number]  HPI Comments: Tracey [name] is a very pleasant 80 year old woman  with a history of coronary artery disease, bypass surgery in 1995, diabetes with hemoglobin A1c 7.0,  stent to her graft to the OM several years ago with chest pain January 2013 and repeat stenting to the graft to the OM with a Xience 3.5 x 18 mm stent (patient and husband report that she required delivery of a shock for  resuscitation during the procedure), also with left renal PCI in 1998, redo PCI March 2010 followed by [name], who presents for routine followup of her coronary artery disease  She  lost her husband in 2016  In follow-up today, she reports that she's having chronic diarrhea Etiology unclear, scheduled to have colonoscopy, follow-up with Dr. Vira Agar Uncertain if it is from the metformin Recently started on Actos told to increase the dose up to 30 mg daily. She read the cardiac side effects and stopped the medication on her own. She denies any CHF type symptoms She is active, does some exercise regularly Denies any symptoms concerning for angina, no lower extremity edema or shortness of breath on exertion  Prior lab work reviewed with her showing total cholesterol 170s, hemoglobin A1c greater than 7  EKG on today's visit shows normal sinus rhythm with rate 58 bpm, no significant ST or T-wave changes  Other past medical history  She is very active and takes care of a large family.  She had recent right knee replacement in July 2014 with good recovery.  Details from her bypass surgery showed LIMA to the LAD, vein graft to the PDA and vein graft to the OM Details from the cardiac catheterization January 2013 show proximal LAD occlusion, proximal left circumflex occlusion, proximal RCA occlusion, graft to the OM with 90% disease proximally. DES stent was placed.  Echocardiogram dated January 2012 shows normal systolic function, mild  MR and TR   Allergies  Allergen Reactions  . Erythromycin   . Penicillins   . Tramadol     Outpatient Encounter Prescriptions as of 01/05/2016  Medication Sig  . aspirin 81 MG tablet Take 81 mg by mouth daily.   . Calcium Carbonate-Vit D-Min (CALTRATE 600+D PLUS PO) Take 200 Units by mouth daily.  . clopidogrel (PLAVIX) 75 MG tablet Take 1 tablet (75 mg total) by mouth daily.  Marland Kitchen lisinopril (PRINIVIL,ZESTRIL) 20 MG tablet Take 20 mg by mouth daily.  . metFORMIN (GLUCOPHAGE) 1000 MG tablet TAKE 1 TABLET BY MOUTH TWICE DAILY  . metoprolol succinate (TOPROL-XL) 25 MG 24 hr tablet Take 1 tablet (25 mg total) by mouth daily. (Patient taking differently: Take 12.5 mg by mouth daily. )  . Multiple Vitamins-Minerals (MULTIVITAMIN WITH MINERALS) tablet Take 1 tablet by mouth daily.  . nitroGLYCERIN (NITROSTAT) 0.4 MG SL tablet Place 1 tablet (0.4 mg total) under the tongue every 5 (five) minutes as needed for chest pain.  . rosuvastatin (CRESTOR) 40 MG tablet Take 1 tablet (40 mg total) by mouth daily.  . [DISCONTINUED] pioglitazone (ACTOS) 15 MG tablet Take 15 mg by mouth daily. Reported on 01/05/2016   No facility-administered encounter medications on file as of 01/05/2016.    Past Medical History  Diagnosis Date  . Hyperlipidemia   . Diabetes mellitus     non insulin dependent  . Coronary artery disease   . Atrial fibrillation ([place])     history  .  Peripheral vascular disease ([place])   . Hypertension   . Heart murmur   . GERD (gastroesophageal reflux disease)   . Anxiety     Past Surgical History  Procedure Laterality Date  . Cardiac catheterization    . Coronary artery bypass graft      with LIMA  to the LAD, SVG to OM1, SVG to PDA  . Appendectomy    . Tonsillectomy    . Renal artery stent      left, By Dr Hulda Humphrey  . Replacement total knee  03/25/2013    right  . Coronary angioplasty with stent placement    . Cataract extraction w/phaco Left 08/15/2015    Procedure: CATARACT  EXTRACTION PHACO AND INTRAOCULAR LENS PLACEMENT (IOC);  Surgeon: Estill Cotta, MD;  Location: [facility] ORS;  Service: Ophthalmology;  Laterality: Left;  [place] 01:28 AP% 26.4 CDE 35.73 fluid pack # [id number]    Social History  reports that she has never smoked. She does not have any smokeless tobacco history on file. She reports that she does not drink alcohol or use illicit drugs.  Family History family history includes Heart disease in her father; Other in her brother, brother, and brother.   Review of Systems  Constitutional: Negative.   Respiratory: Negative.   Cardiovascular: Negative.   Gastrointestinal: Positive for diarrhea.  Musculoskeletal: Positive for joint swelling and arthralgias.  Neurological: Negative.   Hematological: Negative.   Psychiatric/Behavioral: Negative.   All other systems reviewed and are negative.   BP 122/58 mmHg  Pulse 58  Ht 5\' 3"  (1.6 m)  Wt 136 lb 8 oz (61.916 kg)  BMI 24.19 kg/m2  Physical Exam  Constitutional: She is oriented to person, place, and time. She appears well-developed and well-nourished.  HENT:  Head: Normocephalic.  Nose: Nose normal.  Mouth/Throat: Oropharynx is clear and moist.  Eyes: Conjunctivae are normal. Pupils are equal, round, and reactive to light.  Neck: Normal range of motion. Neck supple. No JVD present.  Cardiovascular: Normal rate, regular rhythm, S1 normal, S2 normal and intact distal pulses.  Exam reveals no gallop and no friction rub.   Murmur heard.  Systolic murmur is present with a grade of 2/6  Pulmonary/Chest: Effort normal and breath sounds normal. No respiratory distress. She has no wheezes. She has no rales. She exhibits no tenderness.  Abdominal: Soft. Bowel sounds are normal. She exhibits no distension. There is no tenderness.  Musculoskeletal: Normal range of motion. She exhibits no edema or tenderness.  Lymphadenopathy:    She has no cervical adenopathy.  Neurological: She is alert and oriented  to person, place, and time. Coordination normal.  Skin: Skin is warm and dry. No rash noted. No erythema.  Psychiatric: She has a normal mood and affect. Her behavior is normal. Judgment and thought content normal.    Assessment and Plan  Nursing note and vitals reviewed.

## 2016-01-05 NOTE — Patient Instructions (Signed)
You are doing well. No medication changes were made.  Please call us if you have new issues that need to be addressed before your next appt.  Your physician wants you to follow-up in: 6 months.  You will receive a reminder letter in the mail two months in advance. If you don't receive a letter, please call our office to schedule the follow-up appointment.   

## 2016-01-05 NOTE — Assessment & Plan Note (Signed)
Currently with no symptoms of angina. No further workup at this time. Continue current medication regimen. 

## 2016-01-05 NOTE — Assessment & Plan Note (Signed)
Blood pressure is well controlled on today's visit. No changes made to the medications. 

## 2016-01-05 NOTE — Assessment & Plan Note (Signed)
Etiology unclear, suggested she talk with GI and Dr. Lovie Macadamia about considering alternate to metformin even for a trial period. Colonoscopy scheduled

## 2016-01-05 NOTE — Assessment & Plan Note (Signed)
Hemoglobin A1c mildly elevated She stopped Actos on her own, concerned about cardiac side effects, blackbox warning Suggested she talk with Dr. Lovie Macadamia for alternate medication   Total encounter time more than 25 minutes  Greater than 50% was spent in counseling and coordination of care with the patient

## 2016-01-05 NOTE — Assessment & Plan Note (Signed)
Cholesterol has not been checked since she has been on Crestor 40 mg daily Goal LDL less than 70 She will follow-up with Dr. Lovie Macadamia for routine lab work If cholesterol continues to run high, would recommend that she start zetia in addition to Crestor

## 2016-01-16 ENCOUNTER — Ambulatory Visit
Admission: RE | Admit: 2016-01-16 | Discharge: 2016-01-16 | Disposition: A | Discharge status: Home / Self Care | Payer: Medicare HMO | Source: Ambulatory Visit | Attending: Unknown Physician Specialty | Admitting: Unknown Physician Specialty

## 2016-01-16 ENCOUNTER — Ambulatory Visit: Payer: Medicare HMO | Admitting: Certified Registered Nurse Anesthetist

## 2016-01-16 ENCOUNTER — Encounter
Admission: RE | Disposition: A | Discharge status: Home / Self Care | Payer: Self-pay | Source: Ambulatory Visit | Attending: Unknown Physician Specialty

## 2016-01-16 ENCOUNTER — Encounter: Payer: Self-pay | Admitting: *Deleted

## 2016-01-16 DIAGNOSIS — E785 Hyperlipidemia, unspecified: Secondary | ICD-10-CM | POA: Diagnosis not present

## 2016-01-16 DIAGNOSIS — Z7982 Long term (current) use of aspirin: Secondary | ICD-10-CM | POA: Diagnosis not present

## 2016-01-16 DIAGNOSIS — Z79899 Other long term (current) drug therapy: Secondary | ICD-10-CM | POA: Diagnosis not present

## 2016-01-16 DIAGNOSIS — D509 Iron deficiency anemia, unspecified: Secondary | ICD-10-CM | POA: Diagnosis present

## 2016-01-16 DIAGNOSIS — K319 Disease of stomach and duodenum, unspecified: Secondary | ICD-10-CM | POA: Diagnosis not present

## 2016-01-16 DIAGNOSIS — K2950 Unspecified chronic gastritis without bleeding: Secondary | ICD-10-CM | POA: Insufficient documentation

## 2016-01-16 DIAGNOSIS — D122 Benign neoplasm of ascending colon: Secondary | ICD-10-CM | POA: Insufficient documentation

## 2016-01-16 DIAGNOSIS — Z7984 Long term (current) use of oral hypoglycemic drugs: Secondary | ICD-10-CM | POA: Insufficient documentation

## 2016-01-16 DIAGNOSIS — K648 Other hemorrhoids: Secondary | ICD-10-CM | POA: Insufficient documentation

## 2016-01-16 DIAGNOSIS — F419 Anxiety disorder, unspecified: Secondary | ICD-10-CM | POA: Diagnosis not present

## 2016-01-16 DIAGNOSIS — K219 Gastro-esophageal reflux disease without esophagitis: Secondary | ICD-10-CM | POA: Diagnosis not present

## 2016-01-16 DIAGNOSIS — I2510 Atherosclerotic heart disease of native coronary artery without angina pectoris: Secondary | ICD-10-CM | POA: Insufficient documentation

## 2016-01-16 DIAGNOSIS — I10 Essential (primary) hypertension: Secondary | ICD-10-CM | POA: Insufficient documentation

## 2016-01-16 DIAGNOSIS — E119 Type 2 diabetes mellitus without complications: Secondary | ICD-10-CM | POA: Insufficient documentation

## 2016-01-16 DIAGNOSIS — K5730 Diverticulosis of large intestine without perforation or abscess without bleeding: Secondary | ICD-10-CM | POA: Insufficient documentation

## 2016-01-16 HISTORY — PX: COLONOSCOPY WITH PROPOFOL: SHX5780

## 2016-01-16 HISTORY — PX: ESOPHAGOGASTRODUODENOSCOPY (EGD) WITH PROPOFOL: SHX5813

## 2016-01-16 SURGERY — COLONOSCOPY WITH PROPOFOL
Anesthesia: General

## 2016-01-16 MED ORDER — PIPERACILLIN-TAZOBACTAM 3.375 G IVPB 30 MIN
3.3750 g | Freq: Once | INTRAVENOUS | Status: AC
  Administered 2016-01-16: 3.375 g via INTRAVENOUS
  Filled 2016-01-16: qty 50

## 2016-01-16 MED ORDER — MIDAZOLAM HCL 2 MG/2ML IJ SOLN
INTRAMUSCULAR | Status: DC | PRN
  Administered 2016-01-16: 1 mg via INTRAVENOUS

## 2016-01-16 MED ORDER — SODIUM CHLORIDE 0.9 % IV SOLN: INTRAVENOUS | Status: DC

## 2016-01-16 MED ORDER — PROPOFOL 500 MG/50ML IV EMUL
INTRAVENOUS | Status: DC | PRN
  Administered 2016-01-16: 120 ug/kg/min via INTRAVENOUS

## 2016-01-16 MED ORDER — LIDOCAINE HCL (CARDIAC) 20 MG/ML IV SOLN
INTRAVENOUS | Status: DC | PRN
  Administered 2016-01-16: 60 mg via INTRAVENOUS

## 2016-01-16 MED ORDER — PROPOFOL 10 MG/ML IV BOLUS
INTRAVENOUS | Status: DC | PRN
  Administered 2016-01-16: 20 mg via INTRAVENOUS
  Administered 2016-01-16: 10 mg via INTRAVENOUS
  Administered 2016-01-16 (×2): 20 mg via INTRAVENOUS

## 2016-01-16 MED ORDER — SODIUM CHLORIDE 0.9 % IV SOLN
INTRAVENOUS | Status: DC
  Administered 2016-01-16: 1000 mL via INTRAVENOUS

## 2016-01-16 NOTE — Anesthesia Preprocedure Evaluation (Signed)
Anesthesia Evaluation  Patient identified by MRN, date of birth, ID band Patient awake    Reviewed: Allergy & Precautions, H&P , NPO status , Patient's Chart, lab work & pertinent test results, reviewed documented beta blocker date and time   Airway Mallampati: II   Neck ROM: full    Dental  (+) Poor Dentition   Pulmonary neg pulmonary ROS,    Pulmonary exam normal        Cardiovascular hypertension, (-) angina+ CAD and + Peripheral Vascular Disease  negative cardio ROS Normal cardiovascular examAtrial Fibrillation + Valvular Problems/Murmurs      Neuro/Psych negative neurological ROS  negative psych ROS   GI/Hepatic negative GI ROS, Neg liver ROS, GERD  ,  Endo/Other  negative endocrine ROSdiabetes  Renal/GU Renal diseasenegative Renal ROS  negative genitourinary   Musculoskeletal   Abdominal   Peds  Hematology negative hematology ROS (+)   Anesthesia Other Findings Past Medical History:   Hyperlipidemia                                               Diabetes mellitus                                              Comment:non insulin dependent   Coronary artery disease                                      Atrial fibrillation (HCC)                                      Comment:history   Peripheral vascular disease (HCC)                            Hypertension                                                 Heart murmur                                                 GERD (gastroesophageal reflux disease)                       Anxiety                                                    Past Surgical History:   CARDIAC CATHETERIZATION                                       CORONARY  ARTERY BYPASS GRAFT                                    Comment:with LIMA  to the LAD, SVG to OM1, SVG to PDA   APPENDECTOMY                                                  TONSILLECTOMY                                                RENAL ARTERY STENT                                              Comment:left, By Dr Hulda Humphrey   REPLACEMENT TOTAL KNEE                           03/25/2013       Comment:right   CORONARY ANGIOPLASTY WITH STENT PLACEMENT                     CATARACT EXTRACTION W/PHACO                     Left 08/15/2015     Comment:Procedure: CATARACT EXTRACTION PHACO AND               INTRAOCULAR LENS PLACEMENT ([place]);  Surgeon:               Estill Cotta, MD;  Location: [facility] ORS;                Service: Ophthalmology;  Laterality: Left;  [place]               01:28 AP% 26.4 CDE 35.73 fluid pack #               [id number] BMI    Body Mass Index   23.92 kg/m 2     Reproductive/Obstetrics                             Anesthesia Physical Anesthesia Plan  ASA: III  Anesthesia Plan: General   Post-op Pain Management:    Induction:   Airway Management Planned:   Additional Equipment:   Intra-op Plan:   Post-operative Plan:   Informed Consent: I have reviewed the patients History and Physical, chart, labs and discussed the procedure including the risks, benefits and alternatives for the proposed anesthesia with the patient or authorized representative who has indicated his/her understanding and acceptance.   Dental Advisory Given  Plan Discussed with: CRNA  Anesthesia Plan Comments:         Anesthesia Quick Evaluation

## 2016-01-16 NOTE — H&P (Signed)
Primary Care Physician:  Juluis Pitch, MD Primary Gastroenterologist:  Dr. Vira Agar  Pre-Procedure History & Physical: HPI:  Tracey [name] is a 80 y.o. female is here for an .colonoscopy and upper endoscopy.   Past Medical History  Diagnosis Date  . Hyperlipidemia   . Diabetes mellitus     non insulin dependent  . Coronary artery disease   . Atrial fibrillation ([place])     history  . Peripheral vascular disease ([place])   . Hypertension   . Heart murmur   . GERD (gastroesophageal reflux disease)   . Anxiety     Past Surgical History  Procedure Laterality Date  . Cardiac catheterization    . Coronary artery bypass graft      with LIMA  to the LAD, SVG to OM1, SVG to PDA  . Appendectomy    . Tonsillectomy    . Renal artery stent      left, By Dr Hulda Humphrey  . Replacement total knee  03/25/2013    right  . Coronary angioplasty with stent placement    . Cataract extraction w/phaco Left 08/15/2015    Procedure: CATARACT EXTRACTION PHACO AND INTRAOCULAR LENS PLACEMENT (IOC);  Surgeon: Estill Cotta, MD;  Location: [facility] ORS;  Service: Ophthalmology;  Laterality: Left;  [place] 01:28 AP% 26.4 CDE 35.73 fluid pack # [id number]    Prior to Admission medications   Medication Sig Start Date End Date Taking? Authorizing Provider  aspirin 81 MG tablet Take 81 mg by mouth daily.    Yes Historical Provider, MD  Calcium Carbonate-Vit D-Min (CALTRATE 600+D PLUS PO) Take 200 Units by mouth daily.   Yes Historical Provider, MD  clopidogrel (PLAVIX) 75 MG tablet Take 1 tablet (75 mg total) by mouth daily. 10/24/15  Yes Minna Merritts, MD  lisinopril (PRINIVIL,ZESTRIL) 20 MG tablet Take 20 mg by mouth daily.   Yes Historical Provider, MD  metFORMIN (GLUCOPHAGE) 1000 MG tablet TAKE 1 TABLET BY MOUTH TWICE DAILY 07/01/15  Yes Historical Provider, MD  metoprolol succinate (TOPROL-XL) 25 MG 24 hr tablet Take 1 tablet (25 mg total) by mouth daily. Patient taking differently: Take 12.5 mg by mouth  daily.  10/22/14  Yes Minna Merritts, MD  Multiple Vitamins-Minerals (MULTIVITAMIN WITH MINERALS) tablet Take 1 tablet by mouth daily.   Yes Historical Provider, MD  rosuvastatin (CRESTOR) 40 MG tablet Take 1 tablet (40 mg total) by mouth daily. 07/01/15  Yes Minna Merritts, MD  nitroGLYCERIN (NITROSTAT) 0.4 MG SL tablet Place 1 tablet (0.4 mg total) under the tongue every 5 (five) minutes as needed for chest pain. 08/26/13 01/05/16  Minna Merritts, MD    Allergies as of 01/04/2016 - Review Complete 08/15/2015  Allergen Reaction Noted  . Erythromycin  01/10/2012  . Penicillins  08/08/2015  . Tramadol  08/08/2015    Family History  Problem Relation Age of Onset  . Heart disease Father   . Other Brother     open heart surgery  . Other Brother     open heart surgery  . Other Brother     open heart surgery    Social History   Social History  . Marital Status: Widowed    Spouse Name: N/A  . Number of Children: N/A  . Years of Education: N/A   Occupational History  . Not on file.   Social History Main Topics  . Smoking status: Never Smoker   . Smokeless tobacco: Never Used  . Alcohol Use: No  .  Drug Use: No  . Sexual Activity: No   Other Topics Concern  . Not on file   Social History Narrative    Review of Systems: See HPI, otherwise negative ROS  Physical Exam: BP 132/53 mmHg  Pulse 68  Temp(Src) 96.8 F (36 C) (Tympanic)  Resp 17  Ht 5\' 3"  (1.6 m)  Wt 61.236 kg (135 lb)  BMI 23.92 kg/m2  SpO2 100% General:   Alert,  pleasant and cooperative in NAD Head:  Normocephalic and atraumatic. Neck:  Supple; no masses or thyromegaly. Lungs:  Clear throughout to auscultation.    Heart:  Regular rate and rhythm. Abdomen:  Soft, nontender and nondistended. Normal bowel sounds, without guarding, and without rebound.   Neurologic:  Alert and  oriented x4;  grossly normal neurologically.  Impression/Plan: Tracey [name] is here for an endoscopy and colonoscopy to  be performed for Iron def anemia and Personal hx of colon polyps  Risks, benefits, limitations, and alternatives regarding  endoscopy and colonoscopy have been reviewed with the patient.  Questions have been answered.  All parties agreeable.   Gaylyn Cheers, MD  01/16/2016, 9:13 AM

## 2016-01-16 NOTE — Op Note (Signed)
[facility] Gastroenterology Patient Name: Tracey [name] Procedure Date: 01/16/2016 8:10 AM MRN: [id number] Account #: [id number] Date of Birth: Oct 03, 1935 Admit Type: Outpatient Age: 80 Room: [facility] ENDO ROOM 1 Gender: Female Note Status: Finalized Procedure:            Upper GI endoscopy Indications:          Unexplained iron deficiency anemia Providers:            Manya Silvas, MD Referring MD:         Youlanda Roys. Lovie Macadamia, MD (Referring MD) Medicines:            Propofol per Anesthesia Complications:        No immediate complications. Procedure:            Pre-Anesthesia Assessment:                       - After reviewing the risks and benefits, the patient                        was deemed in satisfactory condition to undergo the                        procedure.                       After obtaining informed consent, the endoscope was                        passed under direct vision. Throughout the procedure,                        the patient's blood pressure, pulse, and oxygen                        saturations were monitored continuously. The                        Colonoscope was introduced through the mouth, and                        advanced to the second part of duodenum. The upper GI                        endoscopy was accomplished without difficulty. The                        patient tolerated the procedure well. Findings:      The examined esophagus was normal. GEJ 38cm.      Multiple dispersed, small- medium non-bleeding irregular shape erosions       were found in the gastric body and in the gastric antrum. There were no       stigmata of recent bleeding. Biopsies were taken with a cold forceps for       histology. Biopsies were taken with a cold forceps for Helicobacter       pylori testing.      The examined duodenum was normal. Impression:           - Normal esophagus.                       - Non-bleeding erosive gastropathy.  Biopsied.                       - Normal examined duodenum. Recommendation:       - Await pathology results. Take Omeprazole and Carafate                        fpr stomach. Manya Silvas, MD 01/16/2016 8:29:23 AM This report has been signed electronically. Number of Addenda: 0 Note Initiated On: 01/16/2016 8:10 AM      [facility]

## 2016-01-16 NOTE — Anesthesia Procedure Notes (Signed)
Date/Time: 01/16/2016 8:11 AM Performed by: Johnna Acosta Pre-anesthesia Checklist: Patient identified, Emergency Drugs available, Suction available, Patient being monitored and Timeout performed Patient Re-evaluated:Patient Re-evaluated prior to inductionOxygen Delivery Method: Simple face mask

## 2016-01-16 NOTE — Transfer of Care (Signed)
Immediate Anesthesia Transfer of Care Note  Patient: Tracey [name]  Procedure(s) Performed: Procedure(s): COLONOSCOPY WITH PROPOFOL (N/A) ESOPHAGOGASTRODUODENOSCOPY (EGD) WITH PROPOFOL (N/A)  Patient Location: PACU  Anesthesia Type:General  Level of Consciousness: sedated  Airway & Oxygen Therapy: Patient Spontanous Breathing and Patient connected to face mask oxygen  Post-op Assessment: Report given to RN and Post -op Vital signs reviewed and stable  Post vital signs: Reviewed and stable  Last Vitals:  Filed Vitals:   01/16/16 0854 01/16/16 0856  BP: 100/47 100/47  Pulse: 57 54  Temp: 35.9 C 36 C  Resp: 15 16    Last Pain: There were no vitals filed for this visit.       Complications: No apparent anesthesia complications

## 2016-01-16 NOTE — Op Note (Signed)
[facility] Gastroenterology Patient Name: Tracey [name] Procedure Date: 01/16/2016 8:11 AM MRN: [id number] Account #: [id number] Date of Birth: Nov 28, 1935 Admit Type: Outpatient Age: 80 Room: [facility] ENDO ROOM 1 Gender: Female Note Status: Finalized Procedure:            Colonoscopy Indications:          Unexplained iron deficiency anemia Providers:            Manya Silvas, MD Referring MD:         Youlanda Roys. Lovie Macadamia, MD (Referring MD) Medicines:            Propofol per Anesthesia Complications:        No immediate complications. Procedure:            Pre-Anesthesia Assessment:                       - After reviewing the risks and benefits, the patient                        was deemed in satisfactory condition to undergo the                        procedure.                       After obtaining informed consent, the colonoscope was                        passed under direct vision. Throughout the procedure,                        the patient's blood pressure, pulse, and oxygen                        saturations were monitored continuously. The                        Colonoscope was introduced through the anus and                        advanced to the the cecum, identified by appendiceal                        orifice and ileocecal valve. The colonoscopy was                        performed without difficulty. The patient tolerated the                        procedure well. The quality of the bowel preparation                        was good. Findings:      A 4 mm polyp was found in the distal ascending colon. The polyp was       sessile. The polyp was removed with a jumbo cold forceps. Resection and       retrieval were complete.      Multiple medium-mouthed diverticula were found in the sigmoid colon,       descending colon, transverse colon and ascending colon.      Internal hemorrhoids were found  during endoscopy. The hemorrhoids were        small. Impression:           - One 4 mm polyp in the distal ascending colon, removed                        with a jumbo cold forceps. Resected and retrieved.                       - Diverticulosis in the sigmoid colon, in the                        descending colon, in the transverse colon and in the                        ascending colon.                       - Internal hemorrhoids. Recommendation:       - Await pathology results. Manya Silvas, MD 01/16/2016 8:51:05 AM This report has been signed electronically. Number of Addenda: 0 Note Initiated On: 01/16/2016 8:11 AM Scope Withdrawal Time: 0 hours 10 minutes 43 seconds  Total Procedure Duration: 0 hours 17 minutes 40 seconds       [facility]

## 2016-01-16 NOTE — Anesthesia Postprocedure Evaluation (Signed)
Anesthesia Post Note  Patient: Tracey [name]  Procedure(s) Performed: Procedure(s) (LRB): COLONOSCOPY WITH PROPOFOL (N/A) ESOPHAGOGASTRODUODENOSCOPY (EGD) WITH PROPOFOL (N/A)  Patient location during evaluation: PACU Anesthesia Type: General Level of consciousness: awake and alert Pain management: pain level controlled Vital Signs Assessment: post-procedure vital signs reviewed and stable Respiratory status: spontaneous breathing, nonlabored ventilation, respiratory function stable and patient connected to nasal cannula oxygen Cardiovascular status: blood pressure returned to baseline and stable Postop Assessment: no signs of nausea or vomiting Anesthetic complications: no    Last Vitals:  Filed Vitals:   01/16/16 0914 01/16/16 0924  BP:  129/58  Pulse: 58 65  Temp:    Resp: 15 17    Last Pain: There were no vitals filed for this visit.               Molli Barrows

## 2016-01-17 ENCOUNTER — Encounter: Payer: Self-pay | Admitting: Unknown Physician Specialty

## 2016-01-17 LAB — SURGICAL PATHOLOGY

## 2016-02-06 ENCOUNTER — Ambulatory Visit (INDEPENDENT_AMBULATORY_CARE_PROVIDER_SITE_OTHER): Payer: Medicare HMO | Admitting: Family Medicine

## 2016-02-06 ENCOUNTER — Encounter: Payer: Self-pay | Admitting: Family Medicine

## 2016-02-06 VITALS — BP 124/62 | HR 69 | Temp 97.9°F | Wt 132.5 lb

## 2016-02-06 DIAGNOSIS — R5383 Other fatigue: Secondary | ICD-10-CM | POA: Diagnosis not present

## 2016-02-06 DIAGNOSIS — E785 Hyperlipidemia, unspecified: Secondary | ICD-10-CM

## 2016-02-06 DIAGNOSIS — Z951 Presence of aortocoronary bypass graft: Secondary | ICD-10-CM

## 2016-02-06 DIAGNOSIS — I2510 Atherosclerotic heart disease of native coronary artery without angina pectoris: Secondary | ICD-10-CM | POA: Diagnosis not present

## 2016-02-06 DIAGNOSIS — I2583 Coronary atherosclerosis due to lipid rich plaque: Secondary | ICD-10-CM

## 2016-02-06 DIAGNOSIS — I701 Atherosclerosis of renal artery: Secondary | ICD-10-CM

## 2016-02-06 DIAGNOSIS — I10 Essential (primary) hypertension: Secondary | ICD-10-CM | POA: Diagnosis not present

## 2016-02-06 DIAGNOSIS — E0800 Diabetes mellitus due to underlying condition with hyperosmolarity without nonketotic hyperglycemic-hyperosmolar coma (NKHHC): Secondary | ICD-10-CM

## 2016-02-06 LAB — CBC WITH DIFFERENTIAL/PLATELET
Basophils Absolute: 0 10*3/uL (ref 0.0–0.1)
Basophils Relative: 0.4 % (ref 0.0–3.0)
EOS PCT: 1 % (ref 0.0–5.0)
Eosinophils Absolute: 0 10*3/uL (ref 0.0–0.7)
HEMATOCRIT: 34.4 % — AB (ref 36.0–46.0)
Hemoglobin: 11.3 g/dL — ABNORMAL LOW (ref 12.0–15.0)
LYMPHS ABS: 1.6 10*3/uL (ref 0.7–4.0)
LYMPHS PCT: 31.4 % (ref 12.0–46.0)
MCHC: 32.7 g/dL (ref 30.0–36.0)
MCV: 83.1 fl (ref 78.0–100.0)
MONOS PCT: 7.8 % (ref 3.0–12.0)
Monocytes Absolute: 0.4 10*3/uL (ref 0.1–1.0)
NEUTROS ABS: 3 10*3/uL (ref 1.4–7.7)
Neutrophils Relative %: 59.4 % (ref 43.0–77.0)
PLATELETS: 228 10*3/uL (ref 150.0–400.0)
RBC: 4.14 Mil/uL (ref 3.87–5.11)
RDW: 13.8 % (ref 11.5–15.5)
WBC: 5 10*3/uL (ref 4.0–10.5)

## 2016-02-06 LAB — COMPREHENSIVE METABOLIC PANEL
ALT: 12 U/L (ref 0–35)
AST: 22 U/L (ref 0–37)
Albumin: 4.7 g/dL (ref 3.5–5.2)
Alkaline Phosphatase: 39 U/L (ref 39–117)
BUN: 24 mg/dL — ABNORMAL HIGH (ref 6–23)
CALCIUM: 10.9 mg/dL — AB (ref 8.4–10.5)
CHLORIDE: 104 meq/L (ref 96–112)
CO2: 26 meq/L (ref 19–32)
Creatinine, Ser: 0.79 mg/dL (ref 0.40–1.20)
GFR: 74.91 mL/min (ref >60.00)
Glucose, Bld: 123 mg/dL — ABNORMAL HIGH (ref 70–99)
Potassium: 4.5 mEq/L (ref 3.5–5.1)
Sodium: 140 mEq/L (ref 135–145)
Total Bilirubin: 0.6 mg/dL (ref 0.2–1.2)
Total Protein: 7.1 g/dL (ref 6.0–8.3)

## 2016-02-06 LAB — VITAMIN B12: Vitamin B-12: 898 pg/mL (ref 211–911)

## 2016-02-06 LAB — LIPID PANEL
CHOL/HDL RATIO: 4
Cholesterol: 160 mg/dL (ref 0–200)
HDL: 37.6 mg/dL — AB (ref >39.00)
LDL CALC: 85 mg/dL (ref 0–99)
NonHDL: 122.13
TRIGLYCERIDES: 186 mg/dL — AB (ref 0.0–149.0)
VLDL: 37.2 mg/dL (ref 0.0–40.0)

## 2016-02-06 LAB — MAGNESIUM: Magnesium: 1.6 mg/dL (ref 1.5–2.5)

## 2016-02-06 LAB — HEMOGLOBIN A1C: Hgb A1c MFr Bld: 7.2 % — ABNORMAL HIGH (ref 4.6–6.5)

## 2016-02-06 LAB — VITAMIN D 25 HYDROXY (VIT D DEFICIENCY, FRACTURES): VITD: 29.09 ng/mL — ABNORMAL LOW (ref 30.00–100.00)

## 2016-02-06 LAB — H. PYLORI ANTIBODY, IGG: H Pylori IgG: NEGATIVE

## 2016-02-06 LAB — TSH: TSH: 1.86 u[IU]/mL (ref 0.35–4.50)

## 2016-02-06 MED ORDER — CYANOCOBALAMIN 1000 MCG/ML IJ SOLN: 1000.0000 ug | Freq: Once | INTRAMUSCULAR | Status: DC

## 2016-02-06 NOTE — Assessment & Plan Note (Signed)
Due for lipid panel

## 2016-02-06 NOTE — Assessment & Plan Note (Signed)
Well controlled on current rx. No changes made to rxs today.

## 2016-02-06 NOTE — Assessment & Plan Note (Signed)
Followed by Dr. Rockey Situ. No changes made to rxs today.

## 2016-02-06 NOTE — Patient Instructions (Signed)
Nice to meet you. We will call you with your lab results and you can view them online. 

## 2016-02-06 NOTE — Progress Notes (Signed)
Pre visit review using our clinic review tool, if applicable. No additional management support is needed unless otherwise documented below in the visit note. 

## 2016-02-06 NOTE — Assessment & Plan Note (Signed)
Likely multifactorial. Check labs today. 

## 2016-02-06 NOTE — Progress Notes (Signed)
Subjective:   Patient ID: Tracey [name], female    DOB: Feb 24, 1936, 80 y.o.   MRN: [id number]  Tracey [name] is a pleasant 80 y.o. year old female who presents to clinic today with Establish Care and Fatigue  on 02/06/2016  HPI:  Establishing care from Dr. Lovie Macadamia.  CAD- sp CABG in 1995 and stenting in 2013- Sees Dr. Rockey Situ.   Last saw him on 01/05/16. Note reviewed. Advised to continue current rxs. She is on betablocker, plavix, statin..  Lab Results  Component Value Date   CHOL 141 10/08/2011   HDL 37* 10/08/2011   LDLCALC 68 10/08/2011   TRIG 179 10/08/2011     DM- currently taking metformin 1000 mg twice daily.  Was started on Actos but she stopped taking it after she read about the possible cardiac side effects. Also takes lisinopril. Checks FBSB twice daily, was 116 fasting this am.  Lab Results  Component Value Date   HGBA1C 7.0* 10/08/2011   Fatigue- ongoing for months.  Colonoscopy negative.  Feels she doesn't sleep well but "I havent for years."  She does take Vit B12 1000 mcg daily. Denies CP or SOB.  Lab Results  Component Value Date   WBC 5.3 03/09/2013   HGB 8.7* 03/27/2013   HCT 34.9* 03/09/2013   MCV 87 03/09/2013   PLT 113* 03/27/2013    H/o anemia- recent colonoscopy - 01/16/16- Dr. Vira Agar. No source of bleeding found.   Current Outpatient Prescriptions on File Prior to Visit  Medication Sig Dispense Refill  . aspirin 81 MG tablet Take 81 mg by mouth daily.     . Calcium Carbonate-Vit D-Min (CALTRATE 600+D PLUS PO) Take 200 Units by mouth daily.    . clopidogrel (PLAVIX) 75 MG tablet Take 1 tablet (75 mg total) by mouth daily. 90 tablet 3  . lisinopril (PRINIVIL,ZESTRIL) 20 MG tablet Take 20 mg by mouth daily.    . metFORMIN (GLUCOPHAGE) 1000 MG tablet TAKE 1 TABLET BY MOUTH TWICE DAILY    . metoprolol succinate (TOPROL-XL) 25 MG 24 hr tablet Take 1 tablet (25 mg total) by mouth daily. (Patient taking differently: Take 12.5 mg by mouth  daily. ) 30 tablet 0  . Multiple Vitamins-Minerals (MULTIVITAMIN WITH MINERALS) tablet Take 1 tablet by mouth daily.    . rosuvastatin (CRESTOR) 40 MG tablet Take 1 tablet (40 mg total) by mouth daily. 90 tablet 4  . nitroGLYCERIN (NITROSTAT) 0.4 MG SL tablet Place 1 tablet (0.4 mg total) under the tongue every 5 (five) minutes as needed for chest pain. 25 tablet 6   No current facility-administered medications on file prior to visit.    Allergies  Allergen Reactions  . Ciprofloxacin Other (See Comments)  . Erythromycin   . Penicillins   . Tramadol     Past Medical History  Diagnosis Date  . Hyperlipidemia   . Diabetes mellitus     non insulin dependent  . Coronary artery disease   . Atrial fibrillation ([place])     history  . Peripheral vascular disease ([place])   . Hypertension   . Heart murmur   . GERD (gastroesophageal reflux disease)   . Anxiety     Past Surgical History  Procedure Laterality Date  . Cardiac catheterization    . Coronary artery bypass graft      with LIMA  to the LAD, SVG to OM1, SVG to PDA  . Appendectomy    . Tonsillectomy    . Renal artery  stent      left, By Dr Hulda Humphrey  . Replacement total knee  03/25/2013    right  . Coronary angioplasty with stent placement    . Cataract extraction w/phaco Left 08/15/2015    Procedure: CATARACT EXTRACTION PHACO AND INTRAOCULAR LENS PLACEMENT (IOC);  Surgeon: Estill Cotta, MD;  Location: [facility] ORS;  Service: Ophthalmology;  Laterality: Left;  [place] 01:28 AP% 26.4 CDE 35.73 fluid pack # [id number]  . Colonoscopy with propofol N/A 01/16/2016    Procedure: COLONOSCOPY WITH PROPOFOL;  Surgeon: Manya Silvas, MD;  Location: [facility] ENDOSCOPY;  Service: Endoscopy;  Laterality: N/A;  . Esophagogastroduodenoscopy (egd) with propofol N/A 01/16/2016    Procedure: ESOPHAGOGASTRODUODENOSCOPY (EGD) WITH PROPOFOL;  Surgeon: Manya Silvas, MD;  Location: [facility] ENDOSCOPY;  Service: Endoscopy;  Laterality: N/A;    Family History    Problem Relation Age of Onset  . Heart disease Father   . Other Brother     open heart surgery  . Cancer Brother   . Other Brother     open heart surgery  . Other Brother     open heart surgery  . Hyperlipidemia Daughter     Social History   Social History  . Marital Status: Widowed    Spouse Name: N/A  . Number of Children: N/A  . Years of Education: N/A   Occupational History  . Not on file.   Social History Main Topics  . Smoking status: Never Smoker   . Smokeless tobacco: Never Used  . Alcohol Use: No  . Drug Use: No  . Sexual Activity: No   Other Topics Concern  . Not on file   Social History Narrative   The PMH, PSH, Social History, Family History, Medications, and allergies have been reviewed in [facility], and have been updated if relevant.   Review of Systems  Constitutional: Positive for fatigue. Negative for fever.  HENT: Negative.   Eyes: Negative.   Respiratory: Negative.   Cardiovascular: Negative.   Gastrointestinal: Positive for diarrhea.  Endocrine: Negative.   Genitourinary: Negative.   Musculoskeletal: Negative.   Skin: Negative.   Allergic/Immunologic: Negative.   Neurological: Negative.   Hematological: Negative.   Psychiatric/Behavioral: Negative.        Objective:    BP 124/62 mmHg  Pulse 69  Temp(Src) 97.9 F (36.6 C) (Oral)  Wt 132 lb 8 oz (60.102 kg)  SpO2 98%  Wt Readings from Last 3 Encounters:  02/06/16 132 lb 8 oz (60.102 kg)  01/16/16 135 lb (61.236 kg)  01/05/16 136 lb 8 oz (61.916 kg)     Physical Exam   General:  Well-developed,well-nourished,in no acute distress; alert,appropriate and cooperative throughout examination Head:  normocephalic and atraumatic.   Eyes:  vision grossly intact, pupils equal, pupils round, and pupils reactive to light.   Ears:  R ear normal and L ear normal.   Nose:  no external deformity.   Mouth:  good dentition.   Neck:  No deformities, masses, or tenderness noted. Lungs:   Normal respiratory effort, chest expands symmetrically. Lungs are clear to auscultation, no crackles or wheezes. Heart:  Normal rate and regular rhythm. S1 and S2 normal without gallop, murmur, click, rub or other extra sounds. Abdomen:  Bowel sounds positive,abdomen soft and non-tender without masses, organomegaly or hernias noted. Msk:  No deformity or scoliosis noted of thoracic or lumbar spine.   Extremities:  No clubbing, cyanosis, edema, or deformity noted with normal full range of motion of all joints.  Neurologic:  alert & oriented X3 and gait normal.   Skin:  Intact without suspicious lesions or rashes Cervical Nodes:  No lymphadenopathy noted Axillary Nodes:  No palpable lymphadenopathy Psych:  Cognition and judgment appear intact. Alert and cooperative with normal attention span and concentration. No apparent delusions, illusions, hallucinations       Assessment & Plan:   Hyperlipidemia  Diabetes mellitus due to underlying condition with hyperosmolarity without coma, without long-term current use of insulin (HCC)  Coronary artery disease due to lipid rich plaque  Essential hypertension  Renal artery stenosis (HCC)  S/P CABG x 3  Other fatigue No Follow-up on file.

## 2016-02-06 NOTE — Assessment & Plan Note (Signed)
Check labs today. No changes made to rxs.

## 2016-02-08 MED ORDER — VITAMIN D (ERGOCALCIFEROL) 1.25 MG (50000 UNIT) PO CAPS: 50000.0000 [IU] | ORAL_CAPSULE | ORAL | Status: DC

## 2016-02-08 NOTE — Addendum Note (Signed)
Addended by: Modena Nunnery on: 02/08/2016 09:28 AM   Modules accepted: Orders

## 2016-02-29 ENCOUNTER — Ambulatory Visit (INDEPENDENT_AMBULATORY_CARE_PROVIDER_SITE_OTHER): Payer: Medicare HMO | Admitting: Internal Medicine

## 2016-02-29 ENCOUNTER — Encounter: Payer: Self-pay | Admitting: Internal Medicine

## 2016-02-29 VITALS — BP 124/64 | HR 68 | Temp 97.9°F | Wt 136.5 lb

## 2016-02-29 DIAGNOSIS — R8290 Unspecified abnormal findings in urine: Secondary | ICD-10-CM | POA: Diagnosis not present

## 2016-02-29 DIAGNOSIS — R300 Dysuria: Secondary | ICD-10-CM | POA: Diagnosis not present

## 2016-02-29 DIAGNOSIS — N3289 Other specified disorders of bladder: Secondary | ICD-10-CM | POA: Diagnosis not present

## 2016-02-29 LAB — POC URINALSYSI DIPSTICK (AUTOMATED)
Bilirubin, UA: NEGATIVE
GLUCOSE UA: NEGATIVE
Ketones, UA: NEGATIVE
Nitrite, UA: NEGATIVE
RBC UA: NEGATIVE
UROBILINOGEN UA: 4
pH, UA: 5

## 2016-02-29 MED ORDER — SULFAMETHOXAZOLE-TRIMETHOPRIM 800-160 MG PO TABS: 1.0000 | ORAL_TABLET | Freq: Two times a day (BID) | ORAL | Status: DC

## 2016-02-29 NOTE — Addendum Note (Signed)
Addended by: Royann Shivers A on: 02/29/2016 03:31 PM   Modules accepted: Orders

## 2016-02-29 NOTE — Patient Instructions (Signed)

## 2016-02-29 NOTE — Progress Notes (Signed)
HPI  Pt presents to the clinic today with c/o dysuria and bladder spasms. This started 2 weeks ago. She was seen 02/19/16 for the same, diagnosed with a UTI and started on Keflex. Her urine culture grew out Proteus Mirabilis, sensitive to Keflex. She took all the antibiotics as prescribed but reports she is no better. She denies fever, chills, nausea or low back pain. She denies vaginal complaints. She has taken Urogesic with some relief.   Review of Systems  Past Medical History  Diagnosis Date  . Hyperlipidemia   . Diabetes mellitus     non insulin dependent  . Coronary artery disease   . Atrial fibrillation ([place])     history  . Peripheral vascular disease ([place])   . Hypertension   . Heart murmur   . GERD (gastroesophageal reflux disease)   . Anxiety     Family History  Problem Relation Age of Onset  . Heart disease Father   . Other Brother     open heart surgery  . Cancer Brother   . Other Brother     open heart surgery  . Other Brother     open heart surgery  . Hyperlipidemia Daughter     Social History   Social History  . Marital Status: Widowed    Spouse Name: N/A  . Number of Children: N/A  . Years of Education: N/A   Occupational History  . Not on file.   Social History Main Topics  . Smoking status: Never Smoker   . Smokeless tobacco: Never Used  . Alcohol Use: No  . Drug Use: No  . Sexual Activity: No   Other Topics Concern  . Not on file   Social History Narrative    Allergies  Allergen Reactions  . Ciprofloxacin Other (See Comments)  . Erythromycin   . Penicillins   . Tramadol     Constitutional: Denies fever, malaise, fatigue, headache or abrupt weight changes.   GU: Pt reports bladder spasms, pain with urination. Denies burning sensation, blood in urine, odor or discharge. Skin: Denies redness, rashes, lesions or ulcercations.   No other specific complaints in a complete review of systems (except as listed in HPI above).     Objective:   Physical Exam  BP 124/64 mmHg  Pulse 68  Temp(Src) 97.9 F (36.6 C) (Oral)  Wt 136 lb 8 oz (61.916 kg)  Wt Readings from Last 3 Encounters:  02/06/16 132 lb 8 oz (60.102 kg)  01/16/16 135 lb (61.236 kg)  01/05/16 136 lb 8 oz (61.916 kg)    General: Appears her stated age, well developed, well nourished in NAD. Cardiovascular: Normal rate and rhythm. S1,S2 noted.   Pulmonary/Chest: Normal effort and positive vesicular breath sounds. No respiratory distress. No wheezes, rales or ronchi noted.  Abdomen: Soft. Normal bowel sounds. No distention or masses noted.  Tender to palpation over the bladder area. No CVA tenderness.      Assessment & Plan:   Bladder Spasms and Dysuria secondary to UTI:  Urinalysis: 3+ leuks Will send urine culture eRx sent if for Septra 1 tab BID x 7 days OK to take AZO OTC Drink plenty of fluids  RTC as needed or if symptoms persist. Webb Silversmith, NP

## 2016-02-29 NOTE — Progress Notes (Signed)
Pre visit review using our clinic review tool, if applicable. No additional management support is needed unless otherwise documented below in the visit note. 

## 2016-03-02 LAB — URINE CULTURE
Colony Count: NO GROWTH
Organism ID, Bacteria: NO GROWTH

## 2016-03-12 ENCOUNTER — Other Ambulatory Visit: Payer: Self-pay | Admitting: Family Medicine

## 2016-03-12 ENCOUNTER — Telehealth: Payer: Self-pay | Admitting: Cardiovascular Disease

## 2016-03-12 MED ORDER — LISINOPRIL 20 MG PO TABS: 20.0000 mg | ORAL_TABLET | Freq: Every day | ORAL | Status: DC

## 2016-03-12 NOTE — Telephone Encounter (Signed)
Pt c/o Shortness Of Breath: STAT if SOB developed within the last 24 hours or pt is noticeably SOB on the phone  1. Are you currently SOB (can you hear that pt is SOB on the phone)? No   2. How long have you been experiencing SOB? A week or so   3. Are you SOB when sitting or when up moving around? Moving around   4. Are you currently experiencing any other symptoms? Weak and gives out at the slightest exertion

## 2016-03-12 NOTE — Telephone Encounter (Signed)
Spoke w/ pt.  She reports that she has been feeling weak & SOB for the past 3 weeks. She reports that her heart will "beat so hard I can see my shirt jerking". She was started on Vit D 50,000 units 3 weeks ago and noticed that her sx started about that time.  She call PCP and was advised to stop Vit D supplement.  She took her last dose on Friday.  She report a lot going on recently, she had a UTI and had to take 2 rounds of abx. This month is the 1 year anniversary of her husband's death and she is still grieving.  Sched pt to see Dr. Rockey Situ on 03/21/16 @ 10:40; placed on wait list in the event of a cancellation. She will also call her PCP to see if she can get an appt w/ them sooner.

## 2016-03-21 ENCOUNTER — Ambulatory Visit (INDEPENDENT_AMBULATORY_CARE_PROVIDER_SITE_OTHER): Payer: Medicare HMO | Admitting: Cardiovascular Disease

## 2016-03-21 ENCOUNTER — Encounter: Payer: Self-pay | Admitting: Cardiovascular Disease

## 2016-03-21 VITALS — BP 140/60 | HR 65 | Ht 63.0 in | Wt 135.2 lb

## 2016-03-21 DIAGNOSIS — R002 Palpitations: Secondary | ICD-10-CM

## 2016-03-21 DIAGNOSIS — I2510 Atherosclerotic heart disease of native coronary artery without angina pectoris: Secondary | ICD-10-CM | POA: Diagnosis not present

## 2016-03-21 DIAGNOSIS — R0602 Shortness of breath: Secondary | ICD-10-CM | POA: Diagnosis not present

## 2016-03-21 DIAGNOSIS — E0800 Diabetes mellitus due to underlying condition with hyperosmolarity without nonketotic hyperglycemic-hyperosmolar coma (NKHHC): Secondary | ICD-10-CM

## 2016-03-21 DIAGNOSIS — I10 Essential (primary) hypertension: Secondary | ICD-10-CM | POA: Diagnosis not present

## 2016-03-21 DIAGNOSIS — Z951 Presence of aortocoronary bypass graft: Secondary | ICD-10-CM

## 2016-03-21 DIAGNOSIS — I209 Angina pectoris, unspecified: Secondary | ICD-10-CM

## 2016-03-21 DIAGNOSIS — I2583 Coronary atherosclerosis due to lipid rich plaque: Secondary | ICD-10-CM

## 2016-03-21 MED ORDER — ROSUVASTATIN CALCIUM 40 MG PO TABS: 40.0000 mg | ORAL_TABLET | Freq: Every day | ORAL | Status: DC

## 2016-03-21 NOTE — Progress Notes (Signed)
Patient ID: Tracey [name], female   DOB: 12-Sep-1936, 80 y.o.   MRN: [id number] Cardiology Office Note  Date:  03/21/2016   ID:  Tracey [name], DOB 07-Apr-1936, MRN [id number]  PCP:  Arnette Norris, MD   Chief Complaint  Patient presents with  . other    C/o palpitations and sob. Meds reviewed verbally with pt.    HPI:  Tracey [name] is a very pleasant 80 year old woman with a history of coronary artery disease, bypass surgery in 1995, diabetes with hemoglobin A1c 7.0, stent to her graft to the OM several years ago with chest pain January 2013 and repeat stenting to the graft to the OM with a Xience 3.5 x 18 mm stent (patient and husband report that she required delivery of a shock for resuscitation during the procedure), also with left renal PCI in 1998, redo PCI March 2010 followed by [name], who presents for routine followup of her coronary artery disease  She lost her husband in 2016  On her last clinic visit she had chronic diarrhea. She reports this has dramatically improved No longer takes Actos On today's visit she does report having shortness of breath on exertion Symptoms seem to start early March 2017 after colonoscopy She is concerned that stent may be blocked, had similar symptoms prior to previous coronary occlusion requiring PCI.  Also reports having occasional episodes of tachycardia/palpitations Woke up last night from sleep with tachycardia, had to sit in her recliner until symptoms resolved Tachycardia does not happen on a regular basis, very rare  Denies any leg swelling, no PND or orthopnea, no chest pain on exertion Significant stress, depression as husband passed away proximally one year ago around this time  EKG on today's visit shows normal sinus rhythm with rate 65 bpm, no significant ST or T-wave changes  Other past medical history reviewed  Prior lab work reviewed with her showing total cholesterol 170s, hemoglobin A1c greater than 7  She is very active  and takes care of a large family.  She had recent right knee replacement in July 2014 with good recovery.  Details from her bypass surgery showed LIMA to the LAD, vein graft to the PDA and vein graft to the OM Details from the cardiac catheterization January 2013 show proximal LAD occlusion, proximal left circumflex occlusion, proximal RCA occlusion, graft to the OM with 90% disease proximally. DES stent was placed.  Echocardiogram dated January 2012 shows normal systolic function, mild MR and TR  PMH:   has a past medical history of Hyperlipidemia; Diabetes mellitus; Coronary artery disease; Atrial fibrillation ([place]); Peripheral vascular disease ([place]); Hypertension; Heart murmur; GERD (gastroesophageal reflux disease); and Anxiety.  PSH:    Past Surgical History  Procedure Laterality Date  . Cardiac catheterization    . Coronary artery bypass graft      with LIMA  to the LAD, SVG to OM1, SVG to PDA  . Appendectomy    . Tonsillectomy    . Renal artery stent      left, By Dr Hulda Humphrey  . Replacement total knee  03/25/2013    right  . Coronary angioplasty with stent placement    . Cataract extraction w/phaco Left 08/15/2015    Procedure: CATARACT EXTRACTION PHACO AND INTRAOCULAR LENS PLACEMENT (IOC);  Surgeon: Estill Cotta, MD;  Location: [facility] ORS;  Service: Ophthalmology;  Laterality: Left;  [place] 01:28 AP% 26.4 CDE 35.73 fluid pack # [id number] H  . Colonoscopy with propofol N/A 01/16/2016    Procedure: COLONOSCOPY  WITH PROPOFOL;  Surgeon: Manya Silvas, MD;  Location: [facility] ENDOSCOPY;  Service: Endoscopy;  Laterality: N/A;  . Esophagogastroduodenoscopy (egd) with propofol N/A 01/16/2016    Procedure: ESOPHAGOGASTRODUODENOSCOPY (EGD) WITH PROPOFOL;  Surgeon: Manya Silvas, MD;  Location: [facility] ENDOSCOPY;  Service: Endoscopy;  Laterality: N/A;    Current Outpatient Prescriptions  Medication Sig Dispense Refill  . aspirin 81 MG tablet Take 81 mg by mouth daily.     . Calcium Carbonate-Vit  D-Min (CALTRATE 600+D PLUS PO) Take 200 Units by mouth daily.    . clopidogrel (PLAVIX) 75 MG tablet Take 1 tablet (75 mg total) by mouth daily. 90 tablet 3  . cyanocobalamin (,VITAMIN B-12,) 1000 MCG/ML injection Inject 1 mL (1,000 mcg total) into the muscle once. 1 mL 11  . lisinopril (PRINIVIL,ZESTRIL) 20 MG tablet Take 1 tablet (20 mg total) by mouth daily. 90 tablet 3  . metFORMIN (GLUCOPHAGE) 1000 MG tablet TAKE 1 TABLET BY MOUTH TWICE DAILY    . metoprolol succinate (TOPROL-XL) 25 MG 24 hr tablet Take 1 tablet (25 mg total) by mouth daily. (Patient taking differently: Take 12.5 mg by mouth daily. ) 30 tablet 0  . Multiple Vitamins-Minerals (MULTIVITAMIN WITH MINERALS) tablet Take 1 tablet by mouth daily.    . nitroGLYCERIN (NITROSTAT) 0.4 MG SL tablet Place 1 tablet (0.4 mg total) under the tongue every 5 (five) minutes as needed for chest pain. 25 tablet 6  . pantoprazole (PROTONIX) 40 MG tablet Take 40 mg by mouth daily.    . rosuvastatin (CRESTOR) 40 MG tablet Take 1 tablet (40 mg total) by mouth daily. 90 tablet 4   No current facility-administered medications for this visit.     Allergies:   Ciprofloxacin; Erythromycin; Penicillins; and Tramadol   Social History:  The patient  reports that she has never smoked. She has never used smokeless tobacco. She reports that she does not drink alcohol or use illicit drugs.   Family History:   family history includes Cancer in her brother; Heart disease in her father; Hyperlipidemia in her daughter; Other in her brother, brother, and brother.    Review of Systems: Review of Systems  Respiratory: Positive for shortness of breath.   Cardiovascular: Positive for palpitations.       Tachycardia  Gastrointestinal: Negative.   Musculoskeletal: Negative.   Neurological: Negative.   Psychiatric/Behavioral: Negative.   All other systems reviewed and are negative.    PHYSICAL EXAM: VS:  BP 140/60 mmHg  Pulse 65  Ht 5\' 3"  (1.6 m)  Wt 135  lb 4 oz (61.349 kg)  BMI 23.96 kg/m2 , BMI Body mass index is 23.96 kg/(m^2). GEN: Well nourished, well developed, in no acute distress HEENT: normal Neck: no JVD, carotid bruits, or masses Cardiac: RRR; no murmurs, rubs, or gallops,no edema  Respiratory:  clear to auscultation bilaterally, normal work of breathing GI: soft, nontender, nondistended, + BS MS: no deformity or atrophy Skin: warm and dry, no rash Neuro:  Strength and sensation are intact Psych: euthymic mood, full affect    Recent Labs: 02/06/2016: ALT 12; BUN 24*; Creatinine, Ser 0.79; Hemoglobin 11.3*; Magnesium 1.6; Platelets 228.0; Potassium 4.5; Sodium 140; TSH 1.86    Lipid Panel Lab Results  Component Value Date   CHOL 160 02/06/2016   HDL 37.60* 02/06/2016   LDLCALC 85 02/06/2016   TRIG 186.0* 02/06/2016      Wt Readings from Last 3 Encounters:  03/21/16 135 lb 4 oz (61.349 kg)  02/29/16 136 lb  8 oz (61.916 kg)  02/06/16 132 lb 8 oz (60.102 kg)       ASSESSMENT AND PLAN:  Palpitations/Tachycardia  - Plan: EKG 12-Lead, ECHOCARDIOGRAM COMPLETE, NM Myocar Multi W/Spect W/Wall Motion / EF Etiology of her symptoms is unclear, unable to rule out arrhythmia We have offered 30 day monitor. She will call if she has recurrent symptoms Differential includes atrial fibrillation, even a ventricular arrhythmia  SOB (shortness of breath) -  Plan: EKG 12-Lead, ECHOCARDIOGRAM COMPLETE, NM Myocar Multi W/Spect W/Wall Motion / EF Nonspecific symptoms but concerning for underlying ischemia Echocardiogram and stress test has been ordered after long discussion If symptoms get worse, would proceed with cardiac catheterization  Essential hypertension Blood pressure is well controlled on today's visit. No changes made to the medications.  Coronary artery disease due to lipid rich plaque Known coronary disease, bypass, history of PCI Long discussion about her anatomy, various treatment options for new symptoms of  shortness of breath  Angina pectoris ([place]) I suspect her shortness of breath is her anginal equivalent As above, echo and stress test pending If symptoms get worse, would schedule for catheterization  S/P CABG x 3 Details as above  Diabetes mellitus due to underlying condition with hyperosmolarity without coma, without long-term current use of insulin ([place]) Stressed importance of aggressive diabetes control  Long discussion with family also who present today with the patient Detailed discussion of various testing options available for her symptoms  Total encounter time more than 25 minutes  Greater than 50% was spent in counseling and coordination of care with the patient   Disposition:   F/U  1 month   Orders Placed This Encounter  Procedures  . NM Myocar Multi W/Spect W/Wall Motion / EF  . EKG 12-Lead  . ECHOCARDIOGRAM COMPLETE     Signed, Esmond Plants, M.D., Ph.D. 03/21/2016  [place], [place]

## 2016-03-21 NOTE — Patient Instructions (Addendum)
Medication Instructions:  No medication changes  Labwork: No new labs  Testing/Procedures: We will schedule a lexiscan myoview early next week Skip the metoprolol that morning   [place] caregiver has ordered a Stress Test with nuclear imaging. The purpose of this test is to evaluate the blood supply to your heart muscle. This procedure is referred to as a "Non-Invasive Stress Test." This is because other than having an IV started in your vein, nothing is inserted or "invades" your body. Cardiac stress tests are done to find areas of poor blood flow to the heart by determining the extent of coronary artery disease (CAD). Some patients exercise on a treadmill, which naturally increases the blood flow to your heart, while others who are  unable to walk on a treadmill due to physical limitations have a pharmacologic/chemical stress agent called Lexiscan . This medicine will mimic walking on a treadmill by temporarily increasing your coronary blood flow.   Please note: these test may take anywhere between 2-4 hours to complete  PLEASE REPORT TO [place] AT THE FIRST DESK WILL DIRECT YOU WHERE TO GO  Date of Procedure:_____Monday, July 10_____________  Arrival Time for Procedure:____7:15 am_______________  Instructions regarding medication:   __X__:  Hold METOPROLOL the  night before and morning of procedure  How to prepare for your Myoview test:   Do not eat or drink after midnight  No caffeine for 24 hours prior to test  No smoking 24 hours prior to test.  Your medication may be taken with water. Do not take your METPROLOL that am.  Ladies, please do not wear dresses.  Skirts or pants are appropriate. Please wear a short sleeve shirt.  No perfume, cologne or lotion.  Wear comfortable walking shoes. No heels!  Echocardiogram for shortness of breath, known CAD Echocardiography is a painless test that uses sound waves to create images  of your heart. It provides your doctor with information about the size and shape of your heart and how well your heart's chambers and valves are working. This procedure takes approximately one hour. There are no restrictions for this procedure.  Date & time: ___________________________  Follow-Up: Your physician wants you to follow-up in: 1 month.   Date & time: _________________________________  If you need a refill on your cardiac medications before your next appointment, please call your pharmacy.    Cardiac Nuclear Scanning A cardiac nuclear scan is used to check your heart for problems, such as the following:  A portion of the heart is not getting enough blood.  Part of the heart muscle has died, which happens with a heart attack.  The heart wall is not working normally.  In this test, a radioactive dye (tracer) is injected into your bloodstream. After the tracer has traveled to your heart, a scanning device is used to measure how much of the tracer is absorbed by or distributed to various areas of your heart. LET [facility] CARE PROVIDER KNOW ABOUT:  Any allergies you have.  All medicines you are taking, including vitamins, herbs, eye drops, creams, and over-the-counter medicines.  Previous problems you or members of your family have had with the use of anesthetics.  Any blood disorders you have.  Previous surgeries you have had.  Medical conditions you have.  RISKS AND COMPLICATIONS Generally, this is a safe procedure. However, as with any procedure, problems can occur. Possible problems include:   Serious chest pain.  Rapid heartbeat.  Sensation  of warmth in your chest. This usually passes quickly. BEFORE THE PROCEDURE Ask your health care provider about changing or stopping your regular medicines. PROCEDURE This procedure is usually done at a hospital and takes 2-4 hours.  An IV tube is inserted into one of your veins.  Your health care provider will  inject a small amount of radioactive tracer through the tube.  You will then wait for 20-40 minutes while the tracer travels through your bloodstream.  You will lie down on an exam table so images of your heart can be taken. Images will be taken for about 15-20 minutes.  You will exercise on a treadmill or stationary bike. While you exercise, your heart activity will be monitored with an electrocardiogram (ECG), and your blood pressure will be checked.  If you are unable to exercise, you may be given a medicine to make your heart beat faster.  When blood flow to your heart has peaked, tracer will again be injected through the IV tube.  After 20-40 minutes, you will get back on the exam table and have more images taken of your heart.  When the procedure is over, your IV tube will be removed. AFTER THE PROCEDURE  You will likely be able to leave shortly after the test. Unless your health care provider tells you otherwise, you may return to your normal schedule, including diet, activities, and medicines.  Make sure you find out how and when you will get your test results.   This information is not intended to replace advice given to you by your health care provider. Make sure you discuss any questions you have with your health care provider.   Document Released: 09/28/2004 Document Revised: 09/08/2013 Document Reviewed: 08/12/2013 Elsevier Interactive Patient Education [employer]. Echocardiogram An echocardiogram, or echocardiography, uses sound waves (ultrasound) to produce an image of your heart. The echocardiogram is simple, painless, obtained within a short period of time, and offers valuable information to your health care provider. The images from an echocardiogram can provide information such as:  Evidence of coronary artery disease (CAD).  Heart size.  Heart muscle function.  Heart valve function.  Aneurysm detection.  Evidence of a past heart attack.  Fluid  buildup around the heart.  Heart muscle thickening.  Assess heart valve function. LET [facility] CARE PROVIDER KNOW ABOUT:  Any allergies you have.  All medicines you are taking, including vitamins, herbs, eye drops, creams, and over-the-counter medicines.  Previous problems you or members of your family have had with the use of anesthetics.  Any blood disorders you have.  Previous surgeries you have had.  Medical conditions you have.  Possibility of pregnancy, if this applies. BEFORE THE PROCEDURE  No special preparation is needed. Eat and drink normally.  PROCEDURE   In order to produce an image of your heart, gel will be applied to your chest and a wand-like tool (transducer) will be moved over your chest. The gel will help transmit the sound waves from the transducer. The sound waves will harmlessly bounce off your heart to allow the heart images to be captured in real-time motion. These images will then be recorded.  You may need an IV to receive a medicine that improves the quality of the pictures. AFTER THE PROCEDURE You may return to your normal schedule including diet, activities, and medicines, unless your health care provider tells you otherwise.   This information is not intended to replace advice given to you by your health care provider.  Make sure you discuss any questions you have with your health care provider.   Document Released: 08/31/2000 Document Revised: 09/24/2014 Document Reviewed: 05/11/2013 Elsevier Interactive Patient Education [employer].

## 2016-03-26 ENCOUNTER — Encounter
Admission: RE | Admit: 2016-03-26 | Discharge: 2016-03-26 | Disposition: A | Discharge status: Home / Self Care | Payer: Medicare HMO | Source: Ambulatory Visit | Attending: Cardiovascular Disease | Admitting: Cardiovascular Disease

## 2016-03-26 DIAGNOSIS — R002 Palpitations: Secondary | ICD-10-CM | POA: Diagnosis present

## 2016-03-26 DIAGNOSIS — R0602 Shortness of breath: Secondary | ICD-10-CM | POA: Insufficient documentation

## 2016-03-26 DIAGNOSIS — I2510 Atherosclerotic heart disease of native coronary artery without angina pectoris: Secondary | ICD-10-CM | POA: Insufficient documentation

## 2016-03-26 LAB — NM MYOCAR MULTI W/SPECT W/WALL MOTION / EF
CHL CUP NUCLEAR SRS: 10
CHL CUP NUCLEAR SSS: 5
CSEPED: 0 min
CSEPHR: 76 %
CSEPPHR: 109 {beats}/min
Estimated workload: 1 METS
Exercise duration (sec): 0 s
LV dias vol: 79 mL (ref 46–106)
LV sys vol: 28 mL
MPHR: 143 {beats}/min
NUC STRESS TID: 0.77
Rest HR: 54 {beats}/min
SDS: 1

## 2016-03-26 MED ORDER — REGADENOSON 0.4 MG/5ML IV SOLN
0.4000 mg | Freq: Once | INTRAVENOUS | Status: AC
  Administered 2016-03-26: 0.4 mg via INTRAVENOUS

## 2016-03-26 MED ORDER — TECHNETIUM TC 99M TETROFOSMIN IV KIT
13.0000 | PACK | Freq: Once | INTRAVENOUS | Status: AC | PRN
  Administered 2016-03-26: 13.794 via INTRAVENOUS

## 2016-03-26 MED ORDER — TECHNETIUM TC 99M TETROFOSMIN IV KIT
30.1800 | PACK | Freq: Once | INTRAVENOUS | Status: AC | PRN
  Administered 2016-03-26: 30.18 via INTRAVENOUS

## 2016-03-29 ENCOUNTER — Other Ambulatory Visit: Payer: Self-pay

## 2016-03-29 ENCOUNTER — Ambulatory Visit (INDEPENDENT_AMBULATORY_CARE_PROVIDER_SITE_OTHER): Payer: Medicare HMO

## 2016-03-29 DIAGNOSIS — R002 Palpitations: Secondary | ICD-10-CM

## 2016-03-29 DIAGNOSIS — R0602 Shortness of breath: Secondary | ICD-10-CM | POA: Diagnosis not present

## 2016-04-02 ENCOUNTER — Other Ambulatory Visit: Payer: Self-pay

## 2016-04-02 MED ORDER — FUROSEMIDE 20 MG PO TABS: 20.0000 mg | ORAL_TABLET | Freq: Every day | ORAL | Status: DC | PRN

## 2016-04-02 MED ORDER — POTASSIUM CHLORIDE ER 10 MEQ PO TBCR: 10.0000 meq | EXTENDED_RELEASE_TABLET | Freq: Every day | ORAL | Status: DC | PRN

## 2016-04-10 ENCOUNTER — Other Ambulatory Visit: Payer: Self-pay | Admitting: Family Medicine

## 2016-04-10 ENCOUNTER — Telehealth: Payer: Self-pay | Admitting: Cardiovascular Disease

## 2016-04-10 DIAGNOSIS — E559 Vitamin D deficiency, unspecified: Secondary | ICD-10-CM

## 2016-04-10 NOTE — Telephone Encounter (Signed)
Pt has appt w/ Christell Faith, PA next week.  Will place her on the wait list in the event of a cancellation before then.

## 2016-04-10 NOTE — Telephone Encounter (Signed)
Pt daughter calling stating she is having some real concerns for patient and patient is not calling   Mandi called patient about results on test We did not need to do a cath that we found pt has some fluid  Placed patient on lakis and potasium Pt is not any better since then  Pt is actually doing worst Pt had a fall on the porch since then Pt was told by [place] that she needs to be moving but daughter is worried that was not good to tell her HR changed when she walks from even the living room to the kitchen  Pt has a niece who is around the same age, came up for a visit.  And she stated that aunt looked a lot different then the last time she was seen.    Pt is going down hill, and they are afraid of losing her.  Please advise.

## 2016-04-11 ENCOUNTER — Telehealth: Payer: Self-pay | Admitting: Cardiovascular Disease

## 2016-04-11 ENCOUNTER — Encounter: Payer: Self-pay | Admitting: Cardiovascular Disease

## 2016-04-11 NOTE — Telephone Encounter (Signed)
Spoke w/ pt.  She reports weakness since starting lasix 20 mg.   She reports that her BP monitor does not work very well, so she does not have readings to report.  She checked her BP while on the phone: 147/58, 74. Reports weakness to the point that it is interfering w/ her ADLs.  She reports dizziness on standing, FBG 150s, sugars have been running high recently.   She does change position slowly, advised her to continue to do so. She is concerned, as she is usually very active and she feels too weak to even walk across the room.  She has an appt w/ Ryan next week on 8/4, but would like a recommendation before that time.

## 2016-04-11 NOTE — Telephone Encounter (Signed)
This encounter was created in error - please disregard.

## 2016-04-11 NOTE — Telephone Encounter (Signed)
Pt has a question regarding her fluid pills. States she feels they are making her weak, she care "hardly walk around", has dizziness also.

## 2016-04-11 NOTE — Telephone Encounter (Signed)
Spoke w/ pt.  Advised her of Dr. Gollan's recommendation. She verbalizes understanding and is appreciative of the call.  

## 2016-04-11 NOTE — Telephone Encounter (Signed)
Blood pressure appears normal per the notes If she feels worse on Lasix, could stop the diuretic This was started and recommendation was for her to be taken only as needed for worsening shortness of breath She may only need to take this sparingly with potassium

## 2016-04-16 ENCOUNTER — Other Ambulatory Visit: Payer: Self-pay | Admitting: Family Medicine

## 2016-04-16 MED ORDER — METOPROLOL SUCCINATE ER 25 MG PO TB24: 25.0000 mg | ORAL_TABLET | Freq: Every day | ORAL | 3 refills | Status: DC

## 2016-04-18 ENCOUNTER — Other Ambulatory Visit: Payer: Medicare HMO

## 2016-04-19 ENCOUNTER — Encounter: Payer: Self-pay | Admitting: Family Medicine

## 2016-04-19 ENCOUNTER — Ambulatory Visit (INDEPENDENT_AMBULATORY_CARE_PROVIDER_SITE_OTHER): Payer: Medicare HMO | Admitting: Family Medicine

## 2016-04-19 VITALS — BP 128/66 | HR 65 | Temp 97.8°F | Wt 132.0 lb

## 2016-04-19 DIAGNOSIS — F329 Major depressive disorder, single episode, unspecified: Secondary | ICD-10-CM

## 2016-04-19 DIAGNOSIS — R5383 Other fatigue: Secondary | ICD-10-CM

## 2016-04-19 DIAGNOSIS — F32A Depression, unspecified: Secondary | ICD-10-CM

## 2016-04-19 MED ORDER — MIRTAZAPINE 15 MG PO TABS: 15.0000 mg | ORAL_TABLET | Freq: Every day | ORAL | 3 refills | Status: DC

## 2016-04-19 NOTE — Assessment & Plan Note (Addendum)
Likely multifactorial and could certainly be caused by depression and poor sleep.

## 2016-04-19 NOTE — Progress Notes (Signed)
Pre visit review using our clinic review tool, if applicable. No additional management support is needed unless otherwise documented below in the visit note. 

## 2016-04-19 NOTE — Progress Notes (Signed)
Subjective:   Patient ID: Tracey [name], female    DOB: 11/27/1935, 80 y.o.   MRN: [id number]  Tracey [name] is a pleasant 80 y.o. year old female who presents to clinic today with her grand daughter, Tracey Penna, RN, with Depression and Fatigue  on 04/19/2016  HPI: Depression-  Received the following email this morning, prior to the visit from [facility], her grand daughter-  Dr. Deborra Medina, I wanted to give you a heads up on my grandmother, Tracey [name]. We are coming in at 1130am today to see you and talk. Just in case she doesn't clarify things with you I wanted to give you the whole picture: She has suffered an extreme amount of loss and anxiety/stress/grief that has been building over the past 3-4 years starting with the death of one of her grandchildren. Then a lot of family turmoil with a couple of her daughters and then this year has suffered 2 major losses: the death of her husband last 04/02/2023 (after watching him slowly decline with failure to thrive for about 1 year prior) and a major relationship role change after her very best friend suffered a life altering CVA right after Christmas. This best friend had been my grandmother's outlet for daily shopping, "girl talk" and a major coping measure in helping her get through the loss of her husband. As a result of all of the above, we have watched my grandmother spiral down with increased severity over the last 6 months. Her general health is suffering: we had the issues with GI symptoms and constant diarrhea for 3 months back in the spring (resolving around the time we last saw you). And she was better for a few weeks and then her cardiac condition worsened. She has had severe periods of fatigue, weakness, SOB with exertion. Has hx of bypass surgery in her 40's and has multiple stents (kidney and heart)...in years since.  She saw Gollan a few weeks ago and had a stress test and ECHO. They found "mil" fluid around her heart and started her on Lasix  which further exacerbated hypotension, dehydration and weakness. *we almost ended up in ER last week* Stopped Lasix and she has been slowly improving in past few days.   What we need: 1. We are continuing to follow Gollan carefully to address cardiac changes and see his PA tomorrow. 2. We would like for you to talk with my grandmother today about something to help her "stress" level.. ie: depression.she is sensitive about the stigma of that word so I have referred to her emotions/feelings as stress/anxiety related and that this level of stress/anxiety is very dangerous for her. She now finally recognizes and agrees to get help.  I would like if you could consider maybe a form of med that would help her sleep and increase appetite if there is such a combination. She gets extremely little sleep and states, "I just lay awake and can't shut my mind off to thinking and worrying about everyone and I miss your Papa".  Sorry for the long windedness, I am just not sure how much she will express with you today. But, after I talked with her she is very open to discussing a medication to help her as she states, "I know that is a big part of my health problems".  I will see you at 1130! And thanks for your help with this.  Lab Results  Component Value Date   WBC 5.0 02/06/2016   HGB 11.3 (L)  02/06/2016   HCT 34.4 (L) 02/06/2016   MCV 83.1 02/06/2016   PLT 228.0 02/06/2016   Lab Results  Component Value Date   TSH 1.86 02/06/2016   Lab Results  Component Value Date   ALT 12 02/06/2016   AST 22 02/06/2016   ALKPHOS 39 02/06/2016   BILITOT 0.6 02/06/2016   Lab Results  Component Value Date   [id number] 02/06/2016    Current Outpatient Prescriptions on File Prior to Visit  Medication Sig Dispense Refill  . aspirin 81 MG tablet Take 81 mg by mouth daily.     . Calcium Carbonate-Vit D-Min (CALTRATE 600+D PLUS PO) Take 200 Units by mouth daily.    . clopidogrel (PLAVIX) 75 MG tablet Take 1  tablet (75 mg total) by mouth daily. 90 tablet 3  . cyanocobalamin (,VITAMIN B-12,) 1000 MCG/ML injection Inject 1 mL (1,000 mcg total) into the muscle once. 1 mL 11  . lisinopril (PRINIVIL,ZESTRIL) 20 MG tablet Take 1 tablet (20 mg total) by mouth daily. 90 tablet 3  . metFORMIN (GLUCOPHAGE) 1000 MG tablet TAKE 1 TABLET BY MOUTH TWICE DAILY    . metoprolol succinate (TOPROL-XL) 25 MG 24 hr tablet Take 1 tablet (25 mg total) by mouth daily. (Patient taking differently: Take 12.5 mg by mouth daily. ) 90 tablet 3  . Multiple Vitamins-Minerals (MULTIVITAMIN WITH MINERALS) tablet Take 1 tablet by mouth daily.    . pantoprazole (PROTONIX) 40 MG tablet Take 40 mg by mouth daily.    . rosuvastatin (CRESTOR) 40 MG tablet Take 1 tablet (40 mg total) by mouth daily. 90 tablet 4  . furosemide (LASIX) 20 MG tablet Take 1 tablet (20 mg total) by mouth daily as needed for fluid (shortness of breath). (Patient not taking: Reported on 04/19/2016) 30 tablet 6  . nitroGLYCERIN (NITROSTAT) 0.4 MG SL tablet Place 1 tablet (0.4 mg total) under the tongue every 5 (five) minutes as needed for chest pain. 25 tablet 6  . potassium chloride (K-DUR) 10 MEQ tablet Take 1 tablet (10 mEq total) by mouth daily as needed (take w/ furosemide). (Patient not taking: Reported on 04/19/2016) 30 tablet 6   No current facility-administered medications on file prior to visit.     Allergies  Allergen Reactions  . Ciprofloxacin Other (See Comments)  . Erythromycin   . Penicillins   . Tramadol     Past Medical History:  Diagnosis Date  . Anxiety   . Atrial fibrillation ([place])    history  . Coronary artery disease   . Diabetes mellitus    non insulin dependent  . GERD (gastroesophageal reflux disease)   . Heart murmur   . Hyperlipidemia   . Hypertension   . Peripheral vascular disease [facility])     Past Surgical History:  Procedure Laterality Date  . APPENDECTOMY    . CARDIAC CATHETERIZATION    . CATARACT EXTRACTION W/PHACO  Left 08/15/2015   Procedure: CATARACT EXTRACTION PHACO AND INTRAOCULAR LENS PLACEMENT (IOC);  Surgeon: Estill Cotta, MD;  Location: [facility] ORS;  Service: Ophthalmology;  Laterality: Left;  [place] 01:28 AP% 26.4 CDE 35.73 fluid pack # [id number]  . COLONOSCOPY WITH PROPOFOL N/A 01/16/2016   Procedure: COLONOSCOPY WITH PROPOFOL;  Surgeon: Manya Silvas, MD;  Location: [facility] ENDOSCOPY;  Service: Endoscopy;  Laterality: N/A;  . CORONARY ANGIOPLASTY WITH STENT PLACEMENT    . CORONARY ARTERY BYPASS GRAFT     with LIMA  to the LAD, SVG to OM1, SVG to PDA  . ESOPHAGOGASTRODUODENOSCOPY (  EGD) WITH PROPOFOL N/A 01/16/2016   Procedure: ESOPHAGOGASTRODUODENOSCOPY (EGD) WITH PROPOFOL;  Surgeon: Manya Silvas, MD;  Location: [facility] ENDOSCOPY;  Service: Endoscopy;  Laterality: N/A;  . RENAL ARTERY STENT     left, By Dr Hulda Humphrey  . REPLACEMENT TOTAL KNEE  03/25/2013   right  . TONSILLECTOMY      Family History  Problem Relation Age of Onset  . Heart disease Father   . Other Brother     open heart surgery  . Cancer Brother   . Other Brother     open heart surgery  . Other Brother     open heart surgery  . Hyperlipidemia Daughter     Social History   Social History  . Marital status: Widowed    Spouse name: N/A  . Number of children: N/A  . Years of education: N/A   Occupational History  . Not on file.   Social History Main Topics  . Smoking status: Never Smoker  . Smokeless tobacco: Never Used  . Alcohol use No  . Drug use: No  . Sexual activity: No   Other Topics Concern  . Not on file   Social History Narrative  . No narrative on file   The PMH, PSH, Social History, Family History, Medications, and allergies have been reviewed in [facility], and have been updated if relevant.  Review of Systems  Constitutional: Positive for appetite change and fatigue.  Psychiatric/Behavioral: Positive for dysphoric mood and sleep disturbance. Negative for agitation, behavioral problems, confusion,  decreased concentration, hallucinations, self-injury and suicidal ideas. The patient is nervous/anxious. The patient is not hyperactive.   All other systems reviewed and are negative.      Objective:    BP 128/66   Pulse 65   Temp 97.8 F (36.6 C) (Oral)   Wt 132 lb (59.9 kg)   SpO2 97%   BMI 23.38 kg/m   Wt Readings from Last 3 Encounters:  04/19/16 132 lb (59.9 kg)  03/21/16 135 lb 4 oz (61.3 kg)  02/29/16 136 lb 8 oz (61.9 kg)      Physical Exam  Constitutional: She is oriented to person, place, and time. She appears well-developed and well-nourished. No distress.  HENT:  Head: Normocephalic.  Eyes: Conjunctivae are normal.  Cardiovascular: Normal rate.   Pulmonary/Chest: Effort normal.  Musculoskeletal: Normal range of motion.  Neurological: She is alert and oriented to person, place, and time. No cranial nerve deficit.  Skin: Skin is warm and dry. She is not diaphoretic.  Psychiatric: She has a normal mood and affect. Her behavior is normal. Judgment and thought content normal.  Nursing note and vitals reviewed.         Assessment & Plan:   Other fatigue  Depression No Follow-up on file.

## 2016-04-19 NOTE — Assessment & Plan Note (Addendum)
New- >25 minutes spent in face to face time with patient, >50% spent in counselling or coordination of care PHQ 14 Agrees to rx initiation. Start with low dose remeron at bedtime- 15 mg nightly, break in 1/2 for first 10 days, then increase to entire tablet. She is deferring psychotherapy at this time.

## 2016-04-19 NOTE — Patient Instructions (Signed)
Good to see you. We are starting remeron 15 mg nightly, ok to start with a 1/2 tablet for a week to 10 days before increasing to a full tablet.  Please update me in a few weeks.

## 2016-04-20 ENCOUNTER — Ambulatory Visit (INDEPENDENT_AMBULATORY_CARE_PROVIDER_SITE_OTHER): Payer: Medicare HMO | Admitting: Physician Assistant

## 2016-04-20 ENCOUNTER — Encounter: Payer: Self-pay | Admitting: Physician Assistant

## 2016-04-20 VITALS — BP 140/56 | HR 65 | Ht 63.0 in | Wt 132.5 lb

## 2016-04-20 DIAGNOSIS — I4891 Unspecified atrial fibrillation: Secondary | ICD-10-CM | POA: Diagnosis not present

## 2016-04-20 DIAGNOSIS — I2583 Coronary atherosclerosis due to lipid rich plaque: Secondary | ICD-10-CM

## 2016-04-20 DIAGNOSIS — R002 Palpitations: Secondary | ICD-10-CM

## 2016-04-20 DIAGNOSIS — I209 Angina pectoris, unspecified: Secondary | ICD-10-CM

## 2016-04-20 DIAGNOSIS — R0602 Shortness of breath: Secondary | ICD-10-CM

## 2016-04-20 DIAGNOSIS — I2510 Atherosclerotic heart disease of native coronary artery without angina pectoris: Secondary | ICD-10-CM

## 2016-04-20 NOTE — Progress Notes (Signed)
Cardiology Office Note Date:  04/20/2016  Patient ID:  Tracey [name], Tracey [name] [birth year], MRN [id number] PCP:  Arnette Norris, MD  Cardiologist:  Dr. Rockey Situ, MD    Chief Complaint: Follow up stress test  History of Present Illness: CHESNA STAPLES is a 80 y.o. female with history of CAD s/p 3-vessel CABG in 1995 (LIMA-LAD, SVG-OM, SVG-PDA) s/p stenting to VG to OM several years ago s/p repeat PCI/DES to VG to OM in [id number] reportedly complicated by defibrillation, PAF not on long term full-dose anticoagulation though on aspirin and Plavix, PAD s/p left renal PCI in 1998 with redo in 11/2008, DM2, hypertensive heart disease, HLD, chronic diarrhea, increased stress at home s/p loss of her husband and grandchild, depression, and anxiety who presents for follow up of   She was last seen in clinic on 03/21/16. At that time she noted DOE that apparently started in 11/2015 s/p colonoscopy. She was concerned one of her stents may be blocked at that time. She also noted palpitations and elevated heart rate that will wake her from her sleep. She underwent nuclear stress testing that showed no evidence of ischemia. TWI was noted 1 minute into stress that resolved 1-5 minutes into recovery. EF of 55-65%. Overall, this was a low risk study. Echo was performed on 03/29/16 that showed an EF of 55-60%, normal wall motion, GR1DD, mild MR, mild biatrial enlargement, PASP 42 mm Hg. She was advised to take Lasix 20 mg prn SOB. 30-day event monitor was deferred at the time of her office visit in July. She called back stating since she started the Lasix she felt worse. She noted increased weakness and had suffered a fall on her porch. Her BP was noted to be in the [id number] systolic over the phone with a pulse of 74 bpm. She also noted increased blood sugars as of late into the 150's. She felt too weak to walk across the room.   She saw her PCP on 8/3 for issues with anxiety, depression, loss of appetite and insomnia since the deaths of  her husband and a grandchild. She was started on Remeron.   Today, she is accompanied by her granddaughter. She has noted continued and worsening fatigue, SOB, and dizziness. When ambulating her heart rate will "beat fast" with associated SOB and dizziness. She is unable to tell me how fast her heart rate has been. Her palpiations will last approximately several minutes and self resolve with rest. She is having to hold onto furniture when ambulating in her house now because of unsteadiness and weakness. Never with any chest pain. Her appetite remains poor. She reports not tolerating Remeron the night prior but is going to continue this. BP has improved to the 1-teens to 140's off Lasix for mildly elevated right heart pressures. She prefers to hold Lasix at this time.    Past Medical History:  Diagnosis Date  . Anxiety   . Atrial fibrillation ([place])    history  . Coronary artery disease   . Diabetes mellitus    non insulin dependent  . GERD (gastroesophageal reflux disease)   . Heart murmur   . Hyperlipidemia   . Hypertension   . Peripheral vascular disease [facility])     Past Surgical History:  Procedure Laterality Date  . APPENDECTOMY    . CARDIAC CATHETERIZATION    . CATARACT EXTRACTION W/PHACO Left 08/15/2015   Procedure: CATARACT EXTRACTION PHACO AND INTRAOCULAR LENS PLACEMENT (IOC);  Surgeon: Estill Cotta, MD;  Location: [facility] ORS;  Service: Ophthalmology;  Laterality: Left;  [place] 01:28 AP% 26.4 CDE 35.73 fluid pack # [id number]  . COLONOSCOPY WITH PROPOFOL N/A 01/16/2016   Procedure: COLONOSCOPY WITH PROPOFOL;  Surgeon: Manya Silvas, MD;  Location: [facility] ENDOSCOPY;  Service: Endoscopy;  Laterality: N/A;  . CORONARY ANGIOPLASTY WITH STENT PLACEMENT    . CORONARY ARTERY BYPASS GRAFT     with LIMA  to the LAD, SVG to OM1, SVG to PDA  . ESOPHAGOGASTRODUODENOSCOPY (EGD) WITH PROPOFOL N/A 01/16/2016   Procedure: ESOPHAGOGASTRODUODENOSCOPY (EGD) WITH PROPOFOL;  Surgeon: Manya Silvas,  MD;  Location: [facility] ENDOSCOPY;  Service: Endoscopy;  Laterality: N/A;  . RENAL ARTERY STENT     left, By Dr Hulda Humphrey  . REPLACEMENT TOTAL KNEE  03/25/2013   right  . TONSILLECTOMY      Current Outpatient Prescriptions  Medication Sig Dispense Refill  . aspirin 81 MG tablet Take 81 mg by mouth daily.     . Calcium Carbonate-Vit D-Min (CALTRATE 600+D PLUS PO) Take 200 Units by mouth daily.    . clopidogrel (PLAVIX) 75 MG tablet Take 1 tablet (75 mg total) by mouth daily. 90 tablet 3  . cyanocobalamin (,VITAMIN B-12,) 1000 MCG/ML injection Inject 1 mL (1,000 mcg total) into the muscle once. 1 mL 11  . lisinopril (PRINIVIL,ZESTRIL) 20 MG tablet Take 1 tablet (20 mg total) by mouth daily. 90 tablet 3  . metFORMIN (GLUCOPHAGE) 1000 MG tablet TAKE 1 TABLET BY MOUTH TWICE DAILY    . metoprolol succinate (TOPROL-XL) 25 MG 24 hr tablet Take 1 tablet (25 mg total) by mouth daily. (Patient taking differently: Take 12.5 mg by mouth daily. ) 90 tablet 3  . mirtazapine (REMERON) 15 MG tablet Take 1 tablet (15 mg total) by mouth at bedtime. (Patient taking differently: Take 0.5 mg by mouth at bedtime. ) 30 tablet 3  . Multiple Vitamins-Minerals (MULTIVITAMIN WITH MINERALS) tablet Take 1 tablet by mouth daily.    . nitroGLYCERIN (NITROSTAT) 0.4 MG SL tablet Place 1 tablet (0.4 mg total) under the tongue every 5 (five) minutes as needed for chest pain. 25 tablet 6  . pantoprazole (PROTONIX) 40 MG tablet Take 40 mg by mouth daily.    . rosuvastatin (CRESTOR) 40 MG tablet Take 1 tablet (40 mg total) by mouth daily. 90 tablet 4  . furosemide (LASIX) 20 MG tablet Take 1 tablet (20 mg total) by mouth daily as needed for fluid (shortness of breath). (Patient not taking: Reported on 04/19/2016) 30 tablet 6  . potassium chloride (K-DUR) 10 MEQ tablet Take 1 tablet (10 mEq total) by mouth daily as needed (take w/ furosemide). (Patient not taking: Reported on 04/19/2016) 30 tablet 6   No current facility-administered  medications for this visit.     Allergies:   Ciprofloxacin; Erythromycin; Penicillins; and Tramadol   Social History:  The patient  reports that she has never smoked. She has never used smokeless tobacco. She reports that she does not drink alcohol or use drugs.   Family History:  The patient's family history includes Cancer in her brother; Heart disease in her father; Hyperlipidemia in her daughter; Other in her brother, brother, and brother.  ROS:   Review of Systems  Constitutional: Positive for malaise/fatigue and weight loss. Negative for chills, diaphoresis and fever.  HENT: Negative for congestion.   Eyes: Negative for discharge and redness.  Respiratory: Positive for shortness of breath. Negative for cough, sputum production and wheezing.   Cardiovascular: Positive for palpitations. Negative for chest pain,  orthopnea, claudication, leg swelling and PND.  Gastrointestinal: Negative for abdominal pain, heartburn, nausea and vomiting.  Musculoskeletal: Negative for falls and myalgias.  Skin: Negative for rash.  Neurological: Positive for dizziness and weakness. Negative for tingling, tremors, sensory change, speech change, focal weakness and loss of consciousness.  Endo/Heme/Allergies: Does not bruise/bleed easily.  Psychiatric/Behavioral: Negative for substance abuse. The patient is nervous/anxious.   All other systems reviewed and are negative.    PHYSICAL EXAM:  VS:  BP (!) 140/56 (BP Location: Left Arm, Patient Position: Sitting, Cuff Size: Normal)   Pulse 65   Ht 5\' 3"  (1.6 m)   Wt 132 lb 8 oz (60.1 kg)   BMI 23.47 kg/m  BMI: Body mass index is 23.47 kg/m.  Physical Exam  Constitutional: She is oriented to person, place, and time. She appears well-developed and well-nourished.  HENT:  Head: Normocephalic and atraumatic.  Eyes: Right eye exhibits no discharge. Left eye exhibits no discharge.  Neck: Normal range of motion. No JVD present.  Cardiovascular: Normal rate,  regular rhythm, S1 normal, S2 normal and normal heart sounds.  Exam reveals no distant heart sounds, no friction rub, no midsystolic click and no opening snap.   No murmur heard. Pulmonary/Chest: Effort normal and breath sounds normal. No respiratory distress. She has no decreased breath sounds. She has no wheezes. She has no rales. She exhibits no tenderness.  Abdominal: Soft. She exhibits no distension. There is no tenderness.  Musculoskeletal: She exhibits no edema.  Neurological: She is alert and oriented to person, place, and time.  Skin: Skin is warm and dry. No cyanosis. Nails show no clubbing.  Psychiatric: She has a normal mood and affect. Her speech is normal and behavior is normal. Judgment and thought content normal.     EKG:  Was ordered and interpreted by me today. Shows NSR, 65 bpm, low voltage QRS, poor R wave progression, no acute st/t changes   Recent Labs: 02/06/2016: ALT 12; BUN 24; Creatinine, Ser 0.79; Hemoglobin 11.3; Magnesium 1.6; Platelets 228.0; Potassium 4.5; Sodium 140; TSH 1.86  02/06/2016: Cholesterol 160; HDL 37.60; LDL Cholesterol 85; Total CHOL/HDL Ratio 4; Triglycerides 186.0; VLDL 37.2   CrCl cannot be calculated (Patient's most recent lab result is older than the maximum 21 days allowed.).   Wt Readings from Last 3 Encounters:  04/20/16 132 lb 8 oz (60.1 kg)  04/19/16 132 lb (59.9 kg)  03/21/16 135 lb 4 oz (61.3 kg)     Other studies reviewed: Additional studies/records reviewed today include: summarized above  ASSESSMENT AND PLAN:  1. SOB/fatigue/weakness: Recent negative stress test. Symptoms persist and in fact are worse now. Per guidelines would proceed with L/RHC at this time given her continued symptomology. She would [facility] at [facility]. She would like to talk with family prior to scheduling this. I suspect some of these symptoms are multifactorial and discussed this with her and her granddaughter today. Likely involved are possible  cardiac etiology with ischemia, depression, anxiety, ongoing stress of losing her husband and grandchild, loss of appetite, and diabetes with hyperglycemia. Risks and benefits of cardiac catheterization have been discussed with the patient including risks of bleeding, bruising, infection, kidney damage, stroke, heart attack, and death. The patient understands these risks and is willing to proceed with the procedure. All questions have been answered and concerns listened to.   2. Palpitations: Her symptoms feel exactly like when she was previously noted to be in Afib with RVR. Concern for increased Afib  burden at this time. She is not on full-dose anticoagulation for uncertain reasons. Defer to primary cardiologist at this time for anticoagulation. She is tolerating DAPT without any bleeding issues. Strongly consider holding either or both aspirin and Plavix and starting DOAC. Check 30-day event monitor. Continue low-dose Toprol XL 12.5 mg daily. If breakthrough Afib is seen or even another rhythm such as SVT would titrate Toprol as tolerated. Could also consider changing Toprol XL to Cardizem CD given she has known fatigue to beta blockers. Will await event monitor results before changing rate-controlling medications. CHADS2VASc at least 5 (age x 2, DM, vascular disease, sex category).  3. CAD s/p CABG s/p PCI as above: Recommend ischemic evaluation as above. Currently remains on DAPT per primary cardiologist. Last stent 09/2011. Continue Toprol XL as above.   4. HLD: Crestor.   5. PVD: On DAPT as above.   6. Anxiety/loss of appetite/ depression: On Remeron per PCP.   7. DM2: Per PCP.   Disposition: F/u with Dr. Rockey Situ in 1 month  Current medicines are reviewed at length with the patient today.  The patient did not have any concerns regarding medicines.  Melvern Banker PA-C 04/20/2016 2:17 PM     [address], [place] [address] [phone]

## 2016-04-20 NOTE — Patient Instructions (Addendum)
Medication Instructions:  Please continue your current medications  Labwork: None   Testing/Procedures: Your physician has recommended that you wear an event monitor. Event monitors are medical devices that record the heart's electrical activity. Doctors most often [place] these monitors to diagnose arrhythmias. Arrhythmias are problems with the speed or rhythm of the heartbeat. The monitor is a small, portable device. You can wear one while you do your normal daily activities. This is usually used to diagnose what is causing palpitations/syncope (passing out). You will receive a call from [employer] to verify your address before they mail the monitor to your home.  It is very important that you answer this call.   Please discuss heart catheterization w/ your family  Follow-Up: Your physician recommends that you schedule a follow-up appointment in: 1 month  If you need a refill on your cardiac medications before your next appointment, please call your pharmacy.   Cardiac Event Monitoring A cardiac event monitor is a small recording device used to help detect abnormal heart rhythms (arrhythmias). The monitor is used to record heart rhythm when noticeable symptoms such as the following occur:  Fast heartbeats (palpitations), such as heart racing or fluttering.  Dizziness.  Fainting or light-headedness.  Unexplained weakness. The monitor is wired to two electrodes placed on your chest. Electrodes are flat, sticky disks that attach to your skin. The monitor can be worn for up to 30 days. You will wear the monitor at all times, except when bathing.  HOW TO USE YOUR CARDIAC EVENT MONITOR A technician will prepare your chest for the electrode placement. The technician will show you how to place the electrodes, how to work the monitor, and how to replace the batteries. Take time to practice using the monitor before you leave the office. Make sure you understand how to send the information from the  monitor to your health care provider. This requires a telephone with a landline, not a cell phone. You need to:  Wear your monitor at all times, except when you are in water:  Do not get the monitor wet.  Take the monitor off when bathing. Do not swim or use a hot tub with it on.  Keep your skin clean. Do not put body lotion or moisturizer on your chest.  Change the electrodes daily or any time they stop sticking to your skin. You might need to use tape to keep them on.  It is possible that your skin under the electrodes could become irritated. To keep this from happening, try to put the electrodes in slightly different places on your chest. However, they must remain in the area under your left breast and in the upper right section of your chest.  Make sure the monitor is safely clipped to your clothing or in a location close to your body that your health care provider recommends.  Press the button to record when you feel symptoms of heart trouble, such as dizziness, weakness, light-headedness, palpitations, thumping, shortness of breath, unexplained weakness, or a fluttering or racing heart. The monitor is always on and records what happened slightly before you pressed the button, so do not worry about being too late to get good information.  Keep a diary of your activities, such as walking, doing chores, and taking medicine. It is especially important to note what you were doing when you pushed the button to record your symptoms. This will help your health care provider determine what might be contributing to your symptoms. The information stored in  your monitor will be reviewed by your health care provider alongside your diary entries.  Send the recorded information as recommended by your health care provider. It is important to understand that it will take some time for your health care provider to process the results.  Change the batteries as recommended by your health care provider. SEEK  IMMEDIATE MEDICAL CARE IF:   You have chest pain.  You have extreme difficulty breathing or shortness of breath.  You develop a very fast heartbeat that persists.  You develop dizziness that does not go away.  You faint or constantly feel you are about to faint.   This information is not intended to replace advice given to you by your health care provider. Make sure you discuss any questions you have with your health care provider.   Document Released: 06/12/2008 Document Revised: 09/24/2014 Document Reviewed: 03/02/2013 Elsevier Interactive Patient Education 2016 [employer].    Angiogram/Cardiac Catheterization An angiogram, also called angiography, is a procedure used to look at the blood vessels. In this procedure, dye is injected through a long, thin tube (catheter) into an artery. X-rays are then taken. The X-rays will show if there is a blockage or problem in a blood vessel.  LET [facility] CARE PROVIDER KNOW ABOUT:  Any allergies you have, including allergies to shellfish or contrast dye.   All medicines you are taking, including vitamins, herbs, eye drops, creams, and over-the-counter medicines.   Previous problems you or members of your family have had with the use of anesthetics.   Any blood disorders you have.   Previous surgeries you have had.  Any previous kidney problems or failure you have had.  Medical conditions you have.   Possibility of pregnancy, if this applies. RISKS AND COMPLICATIONS Generally, an angiogram is a safe procedure. However, as with any procedure, problems can occur. Possible problems include:  Injury to the blood vessels, including rupture or bleeding.  Infection or bruising at the catheter site.  Allergic reaction to the dye or contrast used.  Kidney damage from the dye or contrast used.  Blood clots that can lead to a stroke or heart attack. BEFORE THE PROCEDURE  Do not eat or drink after midnight on the night before  the procedure, or as directed by your health care provider.   Ask your health care provider if you may drink enough water to take any needed medicines the morning of the procedure.  PROCEDURE  You may be given a medicine to help you relax (sedative) before and during the procedure. This medicine is given through an IV access tube that is inserted into one of your veins.   The area where the catheter will be inserted will be washed and shaved. This is usually done in the groin but may be done in the fold of your arm (near your elbow) or in the wrist.  A medicine will be given to numb the area where the catheter will be inserted (local anesthetic).  The catheter will be inserted with a guide wire into an artery. The catheter is guided by using a type of X-ray (fluoroscopy) to the blood vessel being examined.   Dye is then injected into the catheter, and X-rays are taken. The dye helps to show where any narrowing or blockages are located.  AFTER THE PROCEDURE   If the procedure is done through the leg, you will be kept in bed lying flat for several hours. You will be instructed to not bend or  cross your legs.  The insertion site will be checked frequently.  The pulse in your feet or wrist will be checked frequently.  Additional blood tests, X-rays, and electrocardiography may be done.   You may need to stay in the hospital overnight for observation.    This information is not intended to replace advice given to you by your health care provider. Make sure you discuss any questions you have with your health care provider.   Document Released: 06/13/2005 Document Revised: 09/24/2014 Document Reviewed: 02/04/2013 Elsevier Interactive Patient Education [employer].

## 2016-04-25 ENCOUNTER — Encounter (INDEPENDENT_AMBULATORY_CARE_PROVIDER_SITE_OTHER): Payer: Medicare HMO

## 2016-04-25 DIAGNOSIS — R0602 Shortness of breath: Secondary | ICD-10-CM | POA: Diagnosis not present

## 2016-04-25 DIAGNOSIS — I2510 Atherosclerotic heart disease of native coronary artery without angina pectoris: Secondary | ICD-10-CM

## 2016-04-25 DIAGNOSIS — I209 Angina pectoris, unspecified: Secondary | ICD-10-CM

## 2016-04-25 DIAGNOSIS — R002 Palpitations: Secondary | ICD-10-CM

## 2016-04-25 DIAGNOSIS — I4891 Unspecified atrial fibrillation: Secondary | ICD-10-CM

## 2016-05-16 ENCOUNTER — Institutional Professional Consult (permissible substitution): Payer: Medicare HMO | Admitting: Family Medicine

## 2016-05-16 ENCOUNTER — Telehealth (INDEPENDENT_AMBULATORY_CARE_PROVIDER_SITE_OTHER): Payer: Medicare HMO | Admitting: *Deleted

## 2016-05-16 DIAGNOSIS — R300 Dysuria: Secondary | ICD-10-CM

## 2016-05-16 LAB — POCT URINALYSIS DIPSTICK
BILIRUBIN UA: NEGATIVE
Glucose, UA: NEGATIVE
KETONES UA: NEGATIVE
Leukocytes, UA: NEGATIVE
Nitrite, UA: NEGATIVE
PH UA: 5
Spec Grav, UA: >=1.03
Urobilinogen, UA: 0.2

## 2016-05-16 NOTE — Telephone Encounter (Signed)
Per pts grand daugher she has been experiencing dysuria and flank pain. Pt has upcoming appt and per Dr Deborra Medina, ok to run urine before OV. Results have been entered and culture has been ordered

## 2016-05-17 LAB — URINE CULTURE: ORGANISM ID, BACTERIA: NO GROWTH

## 2016-05-22 ENCOUNTER — Ambulatory Visit (INDEPENDENT_AMBULATORY_CARE_PROVIDER_SITE_OTHER): Payer: Medicare HMO | Admitting: Cardiovascular Disease

## 2016-05-22 ENCOUNTER — Encounter: Payer: Self-pay | Admitting: Cardiovascular Disease

## 2016-05-22 VITALS — BP 120/54 | HR 75 | Ht 63.0 in | Wt 137.0 lb

## 2016-05-22 DIAGNOSIS — F329 Major depressive disorder, single episode, unspecified: Secondary | ICD-10-CM | POA: Diagnosis not present

## 2016-05-22 DIAGNOSIS — Z951 Presence of aortocoronary bypass graft: Secondary | ICD-10-CM

## 2016-05-22 DIAGNOSIS — E785 Hyperlipidemia, unspecified: Secondary | ICD-10-CM

## 2016-05-22 DIAGNOSIS — I10 Essential (primary) hypertension: Secondary | ICD-10-CM

## 2016-05-22 DIAGNOSIS — F32A Depression, unspecified: Secondary | ICD-10-CM

## 2016-05-22 DIAGNOSIS — E0800 Diabetes mellitus due to underlying condition with hyperosmolarity without nonketotic hyperglycemic-hyperosmolar coma (NKHHC): Secondary | ICD-10-CM | POA: Diagnosis not present

## 2016-05-22 MED ORDER — ROSUVASTATIN CALCIUM 40 MG PO TABS: 40.0000 mg | ORAL_TABLET | Freq: Every day | ORAL | 4 refills | Status: DC

## 2016-05-22 NOTE — Patient Instructions (Signed)
Medication Instructions:   Please decrease the lisinopril down to 10 mg daily Consider moving metoprolol to evening and lisinopril to morning  Labwork:  No new labs needed  Testing/Procedures:  No further testing at this time   Follow-Up: It was a pleasure seeing you in the office today. Please call us if you have new issues that need to be addressed before your next appt.  [phone]  Your physician wants you to follow-up in: 6 months.  You will receive a reminder letter in the mail two months in advance. If you don't receive a letter, please call our office to schedule the follow-up appointment.  If you need a refill on your cardiac medications before your next appointment, please call your pharmacy.

## 2016-05-22 NOTE — Progress Notes (Signed)
Cardiology Office Note  Date:  05/22/2016   ID:  Tracey [name], DOB 01/23/1936, MRN [id number]  PCP:  Arnette Norris, MD   Chief Complaint  Patient presents with  . Other    1 month f/u no complaints today. Meds reviewed verbally with pt.    HPI:  [name] is a very pleasant 80 year old woman with a history of coronary artery disease, bypass surgery in 1995, diabetes with hemoglobin A1c 7.0, stent to her graft to the OM several years ago with chest pain January 2013 and repeat stenting to the graft to the OM with a Xience 3.5 x 18 mm stent (patient and husband report that she required delivery of a shock for resuscitation during the procedure), also with left renal PCI in 1998, redo PCI March 2010 followed by [name], who presents for routine followup of her coronary artery disease  She lost her husband in 2016  In follow-up today, she reports that she is doing well overall Continues to wear her 30 day monitor Previous spells of weakness, fainting,  Now seems to be better No episodes in 2 to 3 days One episode last week, felt washed out, had to go sit in her car to recover  On the 30 day monitor, so far with no arrhythmia noted, will wait for final review Results discussed with her  Reports that her diarrhea is better Not sleeping well at times Episodes of tachycardia have improved Denies any leg swelling, no PND or orthopnea, no chest pain on exertion  Lab work reviewed Total chol 160, LDL 85  Other past medical history reviewed Prior lab work reviewed with her showing total cholesterol 170s, hemoglobin A1c greater than 7  She is very active and takes care of a large family.   right knee replacement in July 2014   Details from her bypass surgery showed LIMA to the LAD, vein graft to the PDA and vein graft to the OM Details from the cardiac catheterization January 2013 show proximal LAD occlusion, proximal left circumflex occlusion, proximal RCA occlusion, graft to  the OM with 90% disease proximally. DES stent was placed.  Echocardiogram dated January 2012 shows normal systolic function, mild MR and TR  PMH:   has a past medical history of Anxiety; Atrial fibrillation ([place]); Coronary artery disease; Diabetes mellitus; GERD (gastroesophageal reflux disease); Heart murmur; Hyperlipidemia; Hypertension; and Peripheral vascular disease ([place]).  PSH:    Past Surgical History:  Procedure Laterality Date  . APPENDECTOMY    . CARDIAC CATHETERIZATION    . CATARACT EXTRACTION W/PHACO Left 08/15/2015   Procedure: CATARACT EXTRACTION PHACO AND INTRAOCULAR LENS PLACEMENT (IOC);  Surgeon: Estill Cotta, MD;  Location: [facility] ORS;  Service: Ophthalmology;  Laterality: Left;  [place] 01:28 AP% 26.4 CDE 35.73 fluid pack # [id number]  . COLONOSCOPY WITH PROPOFOL N/A 01/16/2016   Procedure: COLONOSCOPY WITH PROPOFOL;  Surgeon: Manya Silvas, MD;  Location: [facility] ENDOSCOPY;  Service: Endoscopy;  Laterality: N/A;  . CORONARY ANGIOPLASTY WITH STENT PLACEMENT    . CORONARY ARTERY BYPASS GRAFT     with LIMA  to the LAD, SVG to OM1, SVG to PDA  . ESOPHAGOGASTRODUODENOSCOPY (EGD) WITH PROPOFOL N/A 01/16/2016   Procedure: ESOPHAGOGASTRODUODENOSCOPY (EGD) WITH PROPOFOL;  Surgeon: Manya Silvas, MD;  Location: [facility] ENDOSCOPY;  Service: Endoscopy;  Laterality: N/A;  . RENAL ARTERY STENT     left, By Dr Hulda Humphrey  . REPLACEMENT TOTAL KNEE  03/25/2013   right  . TONSILLECTOMY  Current Outpatient Prescriptions  Medication Sig Dispense Refill  . aspirin 81 MG tablet Take 81 mg by mouth daily.     . Calcium Carbonate-Vit D-Min (CALTRATE 600+D PLUS PO) Take 200 Units by mouth daily.    . clopidogrel (PLAVIX) 75 MG tablet Take 1 tablet (75 mg total) by mouth daily. 90 tablet 3  . cyanocobalamin (,VITAMIN B-12,) 1000 MCG/ML injection Inject 1 mL (1,000 mcg total) into the muscle once. 1 mL 11  . lisinopril (PRINIVIL,ZESTRIL) 20 MG tablet Take 1 tablet (20 mg total) by mouth daily. 90  tablet 3  . metFORMIN (GLUCOPHAGE) 1000 MG tablet TAKE 1 TABLET BY MOUTH TWICE DAILY    . metoprolol succinate (TOPROL-XL) 25 MG 24 hr tablet Take 1 tablet (25 mg total) by mouth daily. (Patient taking differently: Take 12.5 mg by mouth daily. ) 90 tablet 3  . mirtazapine (REMERON) 15 MG tablet Take 1 tablet (15 mg total) by mouth at bedtime. 30 tablet 3  . Multiple Vitamins-Minerals (MULTIVITAMIN WITH MINERALS) tablet Take 1 tablet by mouth daily.    . nitroGLYCERIN (NITROSTAT) 0.4 MG SL tablet Place 1 tablet (0.4 mg total) under the tongue every 5 (five) minutes as needed for chest pain. 25 tablet 6  . pantoprazole (PROTONIX) 40 MG tablet Take 40 mg by mouth daily.    . rosuvastatin (CRESTOR) 40 MG tablet Take 1 tablet (40 mg total) by mouth daily. 90 tablet 4  . furosemide (LASIX) 20 MG tablet Take 1 tablet (20 mg total) by mouth daily as needed for fluid (shortness of breath). (Patient not taking: Reported on 04/19/2016) 30 tablet 6  . potassium chloride (K-DUR) 10 MEQ tablet Take 1 tablet (10 mEq total) by mouth daily as needed (take w/ furosemide). (Patient not taking: Reported on 04/19/2016) 30 tablet 6   No current facility-administered medications for this visit.      Allergies:   Ciprofloxacin; Erythromycin; Penicillins; and Tramadol   Social History:  The patient  reports that she has never smoked. She has never used smokeless tobacco. She reports that she does not drink alcohol or use drugs.   Family History:   family history includes Cancer in her brother; Heart disease in her father; Hyperlipidemia in her daughter; Other in her brother, brother, and brother.    Review of Systems: Review of Systems  Constitutional: Negative.   Respiratory: Negative.   Cardiovascular: Negative.   Gastrointestinal: Negative.   Musculoskeletal: Negative.   Neurological: Negative.   Psychiatric/Behavioral: Negative.   All other systems reviewed and are negative.    PHYSICAL EXAM: VS:  BP (!)  120/54 (BP Location: Left Arm, Patient Position: Sitting, Cuff Size: Normal)   Pulse 75   Ht 5\' 3"  (1.6 m)   Wt 137 lb (62.1 kg)   BMI 24.27 kg/m  , BMI Body mass index is 24.27 kg/m. GEN: Well nourished, well developed, in no acute distress  HEENT: normal  Neck: no JVD, carotid bruits, or masses Cardiac: RRR; no murmurs, rubs, or gallops,no edema  Respiratory:  clear to auscultation bilaterally, normal work of breathing GI: soft, nontender, nondistended, + BS MS: no deformity or atrophy  Skin: warm and dry, no rash Neuro:  Strength and sensation are intact Psych: euthymic mood, full affect    Recent Labs: 02/06/2016: ALT 12; BUN 24; Creatinine, Ser 0.79; Hemoglobin 11.3; Magnesium 1.6; Platelets 228.0; Potassium 4.5; Sodium 140; TSH 1.86    Lipid Panel Lab Results  Component Value Date   CHOL 160 02/06/2016  HDL 37.60 (L) 02/06/2016   LDLCALC 85 02/06/2016   TRIG 186.0 (H) 02/06/2016      Wt Readings from Last 3 Encounters:  05/22/16 137 lb (62.1 kg)  04/20/16 132 lb 8 oz (60.1 kg)  04/19/16 132 lb (59.9 kg)       ASSESSMENT AND PLAN:  S/P CABG x 3 Currently with no symptoms of angina. No further workup at this time. Continue current medication regimen.  Hyperlipidemia Cholesterol slightly above goal, recommended she stay on her current regimen for now Suggested she continue to work on her diabetes zetia could be added in her next visit  Diabetes mellitus due to underlying condition with hyperosmolarity without coma, without long-term current use of insulin ([place]) Recommended strict diet, close follow-up with primary care  Depression Continues to have difficulty after loss of her husband over one year ago Not sleeping well  Essential hypertension Continues to have occasional spells of fatigue, lightheadedness. Recommended she decrease lisinopril down to 10 mg daily  unclear if blood pressures dropping.    Total encounter time more than 25 minutes   Greater than 50% was spent in counseling and coordination of care with the patient   Disposition:   F/U  6 months  No orders of the defined types were placed in this encounter.    Signed, Esmond Plants, M.D., Ph.D. 05/22/2016  [place], [place]

## 2016-05-24 ENCOUNTER — Ambulatory Visit (INDEPENDENT_AMBULATORY_CARE_PROVIDER_SITE_OTHER): Payer: Medicare HMO | Admitting: Family Medicine

## 2016-05-24 ENCOUNTER — Encounter: Payer: Self-pay | Admitting: Family Medicine

## 2016-05-24 VITALS — BP 122/62 | HR 62 | Temp 97.7°F | Wt 129.5 lb

## 2016-05-24 DIAGNOSIS — Z23 Encounter for immunization: Secondary | ICD-10-CM

## 2016-05-24 DIAGNOSIS — F329 Major depressive disorder, single episode, unspecified: Secondary | ICD-10-CM

## 2016-05-24 DIAGNOSIS — E0800 Diabetes mellitus due to underlying condition with hyperosmolarity without nonketotic hyperglycemic-hyperosmolar coma (NKHHC): Secondary | ICD-10-CM

## 2016-05-24 DIAGNOSIS — F32A Depression, unspecified: Secondary | ICD-10-CM

## 2016-05-24 NOTE — Assessment & Plan Note (Signed)
Decrease metformin to 500 mg every morning and 1000 mg every evening. Check FSBS during the day when she feels shaky. Follow up in 3 months.

## 2016-05-24 NOTE — Assessment & Plan Note (Signed)
>  15 minutes spent in face to face time with patient, >50% spent in counselling or coordination of care discussing depression and diabetes. Continue current dose of Remeron.  She will continue to keep me updated.

## 2016-05-24 NOTE — Patient Instructions (Signed)
Great to see you. Let's try decreasing your Metformin to 500 mg daily with breakfast and 1000 mg nightly with supper.  Keep me updated.  Please come see me in 3 months.

## 2016-05-24 NOTE — Progress Notes (Signed)
Subjective:   Patient ID: Tracey [name], female    DOB: 1936-04-14, 80 y.o.   MRN: [id number]  Tracey [name] is a pleasant 80 y.o. year old female who presents to clinic today with Follow-up and Medication Problem (discuss effectiveness)  on 05/24/2016  HPI:  Depression/ decreased appetite- started remeron on 04/19/16.  Currently taking 15 mg nightly.  Feels depression has improved.  Appetite is slowly getting better as well.  Wt Readings from Last 3 Encounters:  05/24/16 129 lb 8 oz (58.7 kg)  05/22/16 137 lb (62.1 kg)  04/20/16 132 lb 8 oz (60.1 kg)   DM- she is not having episodes of hypoglycemia that she is aware of- FSBS are never lower than 111 but feels like it is dropping during the day.  Gets a little shaky and sweaty.  Has not been checking FSBS during the day. Taking Metformin 1000 mg twice daily. Lab Results  Component Value Date   HGBA1C 7.2 (H) 02/06/2016      Current Outpatient Prescriptions on File Prior to Visit  Medication Sig Dispense Refill  . aspirin 81 MG tablet Take 81 mg by mouth daily.     . Calcium Carbonate-Vit D-Min (CALTRATE 600+D PLUS PO) Take 200 Units by mouth daily.    . clopidogrel (PLAVIX) 75 MG tablet Take 1 tablet (75 mg total) by mouth daily. 90 tablet 3  . cyanocobalamin (,VITAMIN B-12,) 1000 MCG/ML injection Inject 1 mL (1,000 mcg total) into the muscle once. 1 mL 11  . furosemide (LASIX) 20 MG tablet Take 1 tablet (20 mg total) by mouth daily as needed for fluid (shortness of breath). 30 tablet 6  . lisinopril (PRINIVIL,ZESTRIL) 20 MG tablet Take 1 tablet (20 mg total) by mouth daily. 90 tablet 3  . metFORMIN (GLUCOPHAGE) 1000 MG tablet TAKE 1 TABLET BY MOUTH TWICE DAILY    . metoprolol succinate (TOPROL-XL) 25 MG 24 hr tablet Take 1 tablet (25 mg total) by mouth daily. (Patient taking differently: Take 12.5 mg by mouth daily. ) 90 tablet 3  . mirtazapine (REMERON) 15 MG tablet Take 1 tablet (15 mg total) by mouth at bedtime. 30 tablet 3    . Multiple Vitamins-Minerals (MULTIVITAMIN WITH MINERALS) tablet Take 1 tablet by mouth daily.    . pantoprazole (PROTONIX) 40 MG tablet Take 40 mg by mouth daily.    . potassium chloride (K-DUR) 10 MEQ tablet Take 1 tablet (10 mEq total) by mouth daily as needed (take w/ furosemide). 30 tablet 6  . rosuvastatin (CRESTOR) 40 MG tablet Take 1 tablet (40 mg total) by mouth daily. 90 tablet 4  . nitroGLYCERIN (NITROSTAT) 0.4 MG SL tablet Place 1 tablet (0.4 mg total) under the tongue every 5 (five) minutes as needed for chest pain. 25 tablet 6   No current facility-administered medications on file prior to visit.     Allergies  Allergen Reactions  . Ciprofloxacin Other (See Comments)  . Erythromycin   . Penicillins   . Tramadol     Past Medical History:  Diagnosis Date  . Anxiety   . Atrial fibrillation ([place])    history  . Coronary artery disease   . Diabetes mellitus    non insulin dependent  . GERD (gastroesophageal reflux disease)   . Heart murmur   . Hyperlipidemia   . Hypertension   . Peripheral vascular disease [facility])     Past Surgical History:  Procedure Laterality Date  . APPENDECTOMY    . CARDIAC CATHETERIZATION    .  CATARACT EXTRACTION W/PHACO Left 08/15/2015   Procedure: CATARACT EXTRACTION PHACO AND INTRAOCULAR LENS PLACEMENT (IOC);  Surgeon: Estill Cotta, MD;  Location: [facility] ORS;  Service: Ophthalmology;  Laterality: Left;  [place] 01:28 AP% 26.4 CDE 35.73 fluid pack # [id number]  . COLONOSCOPY WITH PROPOFOL N/A 01/16/2016   Procedure: COLONOSCOPY WITH PROPOFOL;  Surgeon: Manya Silvas, MD;  Location: [facility] ENDOSCOPY;  Service: Endoscopy;  Laterality: N/A;  . CORONARY ANGIOPLASTY WITH STENT PLACEMENT    . CORONARY ARTERY BYPASS GRAFT     with LIMA  to the LAD, SVG to OM1, SVG to PDA  . ESOPHAGOGASTRODUODENOSCOPY (EGD) WITH PROPOFOL N/A 01/16/2016   Procedure: ESOPHAGOGASTRODUODENOSCOPY (EGD) WITH PROPOFOL;  Surgeon: Manya Silvas, MD;  Location: [facility]  ENDOSCOPY;  Service: Endoscopy;  Laterality: N/A;  . RENAL ARTERY STENT     left, By Dr Hulda Humphrey  . REPLACEMENT TOTAL KNEE  03/25/2013   right  . TONSILLECTOMY      Family History  Problem Relation Age of Onset  . Heart disease Father   . Other Brother     open heart surgery  . Cancer Brother   . Other Brother     open heart surgery  . Other Brother     open heart surgery  . Hyperlipidemia Daughter     Social History   Social History  . Marital status: Widowed    Spouse name: N/A  . Number of children: N/A  . Years of education: N/A   Occupational History  . Not on file.   Social History Main Topics  . Smoking status: Never Smoker  . Smokeless tobacco: Never Used  . Alcohol use No  . Drug use: No  . Sexual activity: No   Other Topics Concern  . Not on file   Social History Narrative  . No narrative on file   The PMH, PSH, Social History, Family History, Medications, and allergies have been reviewed in [facility], and have been updated if relevant.   Review of Systems  Constitutional: Negative.   HENT: Negative.   Respiratory: Negative.   Cardiovascular: Negative.   Musculoskeletal: Negative.   Skin: Negative.   Allergic/Immunologic: Negative.   Neurological: Negative.   Hematological: Negative.   Psychiatric/Behavioral: Negative.   All other systems reviewed and are negative.      Objective:    BP 122/62   Pulse 62   Temp 97.7 F (36.5 C) (Oral)   Wt 129 lb 8 oz (58.7 kg)   SpO2 99%   BMI 22.94 kg/m    Physical Exam  Constitutional: She is oriented to person, place, and time. She appears well-developed and well-nourished. No distress.  HENT:  Head: Normocephalic and atraumatic.  Eyes: Conjunctivae are normal.  Neck: Normal range of motion.  Cardiovascular: Normal rate and regular rhythm.   Pulmonary/Chest: Effort normal and breath sounds normal.  Musculoskeletal: Normal range of motion.  Neurological: She is alert and oriented to person, place,  and time. No cranial nerve deficit.  Skin: Skin is warm and dry. She is not diaphoretic.  Psychiatric: She has a normal mood and affect. Her behavior is normal. Thought content normal.  Nursing note and vitals reviewed.         Assessment & Plan:   Depression  Diabetes mellitus due to underlying condition with hyperosmolarity without coma, without long-term current use of insulin ([facility]) No Follow-up on file.

## 2016-05-31 LAB — HM DIABETES EYE EXAM

## 2016-06-07 ENCOUNTER — Encounter: Payer: Self-pay | Admitting: Family Medicine

## 2016-07-17 ENCOUNTER — Other Ambulatory Visit: Payer: Self-pay | Admitting: *Deleted

## 2016-07-17 MED ORDER — GLUCOSE BLOOD VI STRP: ORAL_STRIP | 5 refills | Status: DC

## 2016-07-17 MED ORDER — MIRTAZAPINE 30 MG PO TABS: 30.0000 mg | ORAL_TABLET | Freq: Every day | ORAL | 3 refills | Status: DC

## 2016-07-17 MED ORDER — ONETOUCH BASIC SYSTEM W/DEVICE KIT: 1.0000 | PACK | Freq: Once | 0 refills | Status: AC

## 2016-07-17 MED ORDER — ONETOUCH ULTRASOFT LANCETS MISC: 12 refills | Status: DC

## 2016-07-17 NOTE — Telephone Encounter (Signed)
Dosage of remeron increased, eRx sent.

## 2016-07-17 NOTE — Telephone Encounter (Signed)
Spoke to pt's granddaughter Leafy Ro, who was questioning if pts Remeron dose can be increased to 30 mg. pls advise

## 2016-08-02 ENCOUNTER — Ambulatory Visit (INDEPENDENT_AMBULATORY_CARE_PROVIDER_SITE_OTHER)
Admission: RE | Admit: 2016-08-02 | Discharge: 2016-08-02 | Disposition: A | Discharge status: Home / Self Care | Payer: Medicare HMO | Source: Ambulatory Visit | Attending: Family Medicine | Admitting: Family Medicine

## 2016-08-02 ENCOUNTER — Telehealth: Payer: Self-pay | Admitting: Family Medicine

## 2016-08-02 ENCOUNTER — Ambulatory Visit (INDEPENDENT_AMBULATORY_CARE_PROVIDER_SITE_OTHER): Payer: Medicare HMO | Admitting: Family Medicine

## 2016-08-02 VITALS — BP 136/64 | HR 103 | Temp 98.2°F | Wt 134.2 lb

## 2016-08-02 DIAGNOSIS — J22 Unspecified acute lower respiratory infection: Secondary | ICD-10-CM | POA: Diagnosis not present

## 2016-08-02 DIAGNOSIS — R062 Wheezing: Secondary | ICD-10-CM

## 2016-08-02 LAB — CBC WITH DIFFERENTIAL/PLATELET
Basophils Absolute: 0 10*3/uL (ref 0.0–0.1)
Basophils Relative: 0.3 % (ref 0.0–3.0)
EOS PCT: 2.1 % (ref 0.0–5.0)
Eosinophils Absolute: 0.1 10*3/uL (ref 0.0–0.7)
HEMATOCRIT: 32.6 % — AB (ref 36.0–46.0)
HEMOGLOBIN: 10.6 g/dL — AB (ref 12.0–15.0)
Lymphocytes Relative: 33.9 % (ref 12.0–46.0)
Lymphs Abs: 2.4 10*3/uL (ref 0.7–4.0)
MCHC: 32.6 g/dL (ref 30.0–36.0)
MCV: 81.1 fl (ref 78.0–100.0)
MONOS PCT: 12.5 % — AB (ref 3.0–12.0)
Monocytes Absolute: 0.9 10*3/uL (ref 0.1–1.0)
Neutro Abs: 3.6 10*3/uL (ref 1.4–7.7)
Neutrophils Relative %: 51.2 % (ref 43.0–77.0)
Platelets: 174 10*3/uL (ref 150.0–400.0)
RBC: 4.02 Mil/uL (ref 3.87–5.11)
RDW: 15.5 % (ref 11.5–15.5)
WBC: 7 10*3/uL (ref 4.0–10.5)

## 2016-08-02 MED ORDER — ALBUTEROL SULFATE (2.5 MG/3ML) 0.083% IN NEBU
2.5000 mg | INHALATION_SOLUTION | Freq: Once | RESPIRATORY_TRACT | Status: AC
  Administered 2016-08-02: 2.5 mg via RESPIRATORY_TRACT

## 2016-08-02 MED ORDER — ALBUTEROL SULFATE HFA 108 (90 BASE) MCG/ACT IN AERS: 2.0000 | INHALATION_SPRAY | RESPIRATORY_TRACT | 1 refills | Status: DC | PRN

## 2016-08-02 MED ORDER — IPRATROPIUM BROMIDE 0.02 % IN SOLN
0.5000 mg | Freq: Once | RESPIRATORY_TRACT | Status: AC
  Administered 2016-08-02: 0.5 mg via RESPIRATORY_TRACT

## 2016-08-02 MED ORDER — BENZONATATE 100 MG PO CAPS: 100.0000 mg | ORAL_CAPSULE | Freq: Three times a day (TID) | ORAL | 0 refills | Status: DC | PRN

## 2016-08-02 MED ORDER — PREDNISONE 20 MG PO TABS: ORAL_TABLET | ORAL | 0 refills | Status: DC

## 2016-08-02 MED ORDER — AZITHROMYCIN 250 MG PO TABS: ORAL_TABLET | ORAL | 0 refills | Status: DC

## 2016-08-02 MED ORDER — ONDANSETRON HCL 4 MG PO TABS: 4.0000 mg | ORAL_TABLET | Freq: Three times a day (TID) | ORAL | 0 refills | Status: DC | PRN

## 2016-08-02 NOTE — Patient Instructions (Signed)
Please use your inhaler every 4 hours while you are awake for the next two days and then every 4 hours as needed  Drink enough water to make your urine light yellow   Take prednisone with food and water   Community-Acquired Pneumonia, Adult Pneumonia is an infection of the lungs. There are different types of pneumonia. One type can develop while a person is in a hospital. A different type, called community-acquired pneumonia, develops in people who are not, or have not recently been, in the hospital or other health care facility. What are the causes? Pneumonia may be caused by bacteria, viruses, or funguses. Community-acquired pneumonia is often caused by Streptococcus pneumonia bacteria. These bacteria are often passed from one person to another by breathing in droplets from the cough or sneeze of an infected person. What increases the risk? The condition is more likely to develop in:  People who havechronic diseases, such as chronic obstructive pulmonary disease (COPD), asthma, congestive heart failure, cystic fibrosis, diabetes, or kidney disease.  People who haveearly-stage or late-stage HIV.  People who havesickle cell disease.  People who havehad their spleen removed (splenectomy).  People who havepoor Human resources officer.  People who havemedical conditions that increase the risk of breathing in (aspirating) secretions their own mouth and nose.  People who havea weakened immune system (immunocompromised).  People who smoke.  People whotravel to areas where pneumonia-causing germs commonly exist.  People whoare around animal habitats or animals that have pneumonia-causing germs, including birds, bats, rabbits, cats, and farm animals. What are the signs or symptoms? Symptoms of this condition include:  Adry cough.  A wet (productive) cough.  Fever.  Sweating.  Chest pain, especially when breathing deeply or coughing.  Rapid breathing or difficulty  breathing.  Shortness of breath.  Shaking chills.  Fatigue.  Muscle aches. How is this diagnosed? Your health care provider will take a medical history and perform a physical exam. You may also have other tests, including:  Imaging studies of your chest, including X-rays.  Tests to check your blood oxygen level and other blood gases.  Other tests on blood, mucus (sputum), fluid around your lungs (pleural fluid), and urine. If your pneumonia is severe, other tests may be done to identify the specific cause of your illness. How is this treated? The type of treatment that you receive depends on many factors, such as the cause of your pneumonia, the medicines you take, and other medical conditions that you have. For most adults, treatment and recovery from pneumonia may occur at home. In some cases, treatment must happen in a hospital. Treatment may include:  Antibiotic medicines, if the pneumonia was caused by bacteria.  Antiviral medicines, if the pneumonia was caused by a virus.  Medicines that are given by mouth or through an IV tube.  Oxygen.  Respiratory therapy. Although rare, treating severe pneumonia may include:  Mechanical ventilation. This is done if you are not breathing well on your own and you cannot maintain a safe blood oxygen level.  Thoracentesis. This procedureremoves fluid around one lung or both lungs to help you breathe better. Follow these instructions at home:  Take over-the-counter and prescription medicines only as told by your health care provider.  Only takecough medicine if you are losing sleep. Understand that cough medicine can prevent your body's natural ability to remove mucus from your lungs.  If you were prescribed an antibiotic medicine, take it as told by your health care provider. Do not stop taking the antibiotic  even if you start to feel better.  Sleep in a semi-upright position at night. Try sleeping in a reclining chair, or place a  few pillows under your head.  Do not use tobacco products, including cigarettes, chewing tobacco, and e-cigarettes. If you need help quitting, ask your health care provider.  Drink enough water to keep your urine clear or pale yellow. This will help to thin out mucus secretions in your lungs. How is this prevented? There are ways that you can decrease your risk of developing community-acquired pneumonia. Consider getting a pneumococcal vaccine if:  You are older than 78 years of age.  You are older than 78 years of age and are undergoing cancer treatment, have chronic lung disease, or have other medical conditions that affect your immune system. Ask your health care provider if this applies to you. There are different types and schedules of pneumococcal vaccines. Ask your health care provider which vaccination option is best for you. You may also prevent community-acquired pneumonia if you take these actions:  Get an influenza vaccine every year. Ask your health care provider which type of influenza vaccine is best for you.  Go to the dentist on a regular basis.  Wash your hands often. Use hand sanitizer if soap and water are not available. Contact a health care provider if:  You have a fever.  You are losing sleep because you cannot control your cough with cough medicine. Get help right away if:  You have worsening shortness of breath.  You have increased chest pain.  Your sickness becomes worse, especially if you are an older adult or have a weakened immune system.  You cough up blood. This information is not intended to replace advice given to you by your health care provider. Make sure you discuss any questions you have with your health care provider. Document Released: 09/03/2005 Document Revised: 01/12/2016 Document Reviewed: 12/29/2014 Elsevier Interactive Patient Education  2017 [employer].

## 2016-08-02 NOTE — Progress Notes (Signed)
Subjective:    Patient ID: Tracey [name], female    DOB: [birth year], 79 y.o.   MRN: [id number]  HPI This is a pleasant 80 yo female who is the grandmother of employee [name] who accompanies her today.  The patient presents today with cough and chest congestion x 5 days. Started with feeling bad, fever and fatigue. Low grade temp 99.7. Feels a little short of breath today. Cough productive of cloudy mucus in large amounts. Nose started draining today and she started sneezing. No ear pain or sore throat. No asthma history.Has been wheezing.  Taking mucinex dm with some relief. None today. Gets bronchitis easily/frequently especially in the fall. Has been around great grand children one of whom had a mild illness.    Past Medical History:  Diagnosis Date  . Anxiety   . Atrial fibrillation ([place])    history  . Coronary artery disease   . Diabetes mellitus    non insulin dependent  . GERD (gastroesophageal reflux disease)   . Heart murmur   . Hyperlipidemia   . Hypertension   . Peripheral vascular disease [facility])    Past Surgical History:  Procedure Laterality Date  . APPENDECTOMY    . CARDIAC CATHETERIZATION    . CATARACT EXTRACTION W/PHACO Left 08/15/2015   Procedure: CATARACT EXTRACTION PHACO AND INTRAOCULAR LENS PLACEMENT (IOC);  Surgeon: Estill Cotta, MD;  Location: [facility] ORS;  Service: Ophthalmology;  Laterality: Left;  [place] 01:28 AP% 26.4 CDE 35.73 fluid pack # [id number]  . COLONOSCOPY WITH PROPOFOL N/A 01/16/2016   Procedure: COLONOSCOPY WITH PROPOFOL;  Surgeon: Manya Silvas, MD;  Location: [facility] ENDOSCOPY;  Service: Endoscopy;  Laterality: N/A;  . CORONARY ANGIOPLASTY WITH STENT PLACEMENT    . CORONARY ARTERY BYPASS GRAFT     with LIMA  to the LAD, SVG to OM1, SVG to PDA  . ESOPHAGOGASTRODUODENOSCOPY (EGD) WITH PROPOFOL N/A 01/16/2016   Procedure: ESOPHAGOGASTRODUODENOSCOPY (EGD) WITH PROPOFOL;  Surgeon: Manya Silvas, MD;  Location: [facility] ENDOSCOPY;  Service:  Endoscopy;  Laterality: N/A;  . RENAL ARTERY STENT     left, By Dr Hulda Humphrey  . REPLACEMENT TOTAL KNEE  03/25/2013   right  . TONSILLECTOMY     Family History  Problem Relation Age of Onset  . Heart disease Father   . Other Brother     open heart surgery  . Cancer Brother   . Other Brother     open heart surgery  . Other Brother     open heart surgery  . Hyperlipidemia Daughter    Social History  Substance Use Topics  . Smoking status: Never Smoker  . Smokeless tobacco: Never Used  . Alcohol use No      Review of Systems  Constitutional: Positive for fatigue and fever. Negative for appetite change.  HENT: Positive for congestion, rhinorrhea and sneezing. Negative for ear pain and sore throat.   Respiratory: Positive for cough, chest tightness, shortness of breath and wheezing.   Cardiovascular: Negative for chest pain.       Objective:   Physical Exam  Constitutional: She is oriented to person, place, and time. She appears well-developed and well-nourished. No distress.  HENT:  Head: Normocephalic and atraumatic.  Right Ear: Tympanic membrane, external ear and ear canal normal.  Left Ear: Tympanic membrane, external ear and ear canal normal.  Nose: Nose normal.  Mouth/Throat: Uvula is midline and oropharynx is clear and moist.  Small amount post nasal drainage.   Cardiovascular: Normal rate,  regular rhythm and normal heart sounds.   Pulmonary/Chest: Effort normal. No respiratory distress. She has wheezes (expiratory, throughout,). She has no rales.  Neurological: She is alert and oriented to person, place, and time.  Skin: Skin is warm and dry. She is not diaphoretic.  Psychiatric: She has a normal mood and affect. Her behavior is normal. Judgment and thought content normal.  Vitals reviewed.   BP 136/64   Pulse (!) 103   Temp 98.2 F (36.8 C) (Oral)   Wt 134 lb 4 oz (60.9 kg)   SpO2 98%   BMI 23.78 kg/m  Wt Readings from Last 3 Encounters:  08/02/16 134 lb 4  oz (60.9 kg)  05/24/16 129 lb 8 oz (58.7 kg)  05/22/16 137 lb (62.1 kg)   Meds ordered this encounter  Medications  . albuterol (PROVENTIL) (2.5 MG/3ML) 0.083% nebulizer solution 2.5 mg  . ipratropium (ATROVENT) nebulizer solution 0.5 mg   Patient with significant subjective and objective improvement following nebulizer treatment. Wheezing nearly completely cleared.      Assessment & Plan:  1. Wheezing - albuterol (PROVENTIL) (2.5 MG/3ML) 0.083% nebulizer solution 2.5 mg; Take 3 mLs (2.5 mg total) by nebulization once. - ipratropium (ATROVENT) nebulizer solution 0.5 mg; Take 2.5 mLs (0.5 mg total) by nebulization once. - CBC with Differential/Platelet - DG Chest 2 View; Future  2. Lower respiratory infection - CBC with Differential/Platelet - DG Chest 2 View; Future - azithromycin (ZITHROMAX) 250 MG tablet; Take 2 tabs PO x 1 dose, then 1 tab PO QD x 4 days  Dispense: 6 tablet; Refill: 0 - albuterol (PROVENTIL HFA;VENTOLIN HFA) 108 (90 Base) MCG/ACT inhaler; Inhale 2 puffs into the lungs every 4 (four) hours as needed for wheezing or shortness of breath (cough, shortness of breath or wheezing.).  Dispense: 1 Inhaler; Refill: 1 - benzonatate (TESSALON) 100 MG capsule; Take 1-2 capsules (100-200 mg total) by mouth 3 (three) times daily as needed for cough.  Dispense: 20 capsule; Refill: 0 - predniSONE (DELTASONE) 20 MG tablet; Take 3 tablets by mouth x 2 days, then 2 tablets x 2 days then 1 tablet x 4 days  Dispense: 14 tablet; Refill: 0   Clarene Reamer, FNP-BC  [facility] at [facility]  08/04/2016 7:42 AM

## 2016-08-02 NOTE — Progress Notes (Signed)
Pre visit review using our clinic review tool, if applicable. No additional management support is needed unless otherwise documented below in the visit note. 

## 2016-08-02 NOTE — Telephone Encounter (Signed)
Spoke with patient's grand daughter.  Pt is taking prednisone and zpack for URI.  Very nauseated and dry heaving.  [place] daughter is Therapist, sports and requests zofran to be sent to her pharmacy.  eRx sent.

## 2016-08-04 ENCOUNTER — Encounter: Payer: Self-pay | Admitting: Family Medicine

## 2016-08-08 ENCOUNTER — Encounter: Payer: Self-pay | Admitting: Family Medicine

## 2016-08-08 ENCOUNTER — Ambulatory Visit (INDEPENDENT_AMBULATORY_CARE_PROVIDER_SITE_OTHER): Payer: Medicare HMO | Admitting: Family Medicine

## 2016-08-08 VITALS — BP 110/58 | HR 89 | Temp 98.0°F | Wt 131.4 lb

## 2016-08-08 DIAGNOSIS — J22 Unspecified acute lower respiratory infection: Secondary | ICD-10-CM | POA: Diagnosis not present

## 2016-08-08 DIAGNOSIS — R062 Wheezing: Secondary | ICD-10-CM | POA: Diagnosis not present

## 2016-08-08 DIAGNOSIS — R059 Cough, unspecified: Secondary | ICD-10-CM

## 2016-08-08 DIAGNOSIS — R05 Cough: Secondary | ICD-10-CM

## 2016-08-08 NOTE — Patient Instructions (Signed)
Finish your prednisone Continue to drink adequate fluids  Use your albuterol every 4-6 hours as needed

## 2016-08-08 NOTE — Progress Notes (Signed)
Subjective:    Patient ID: Tracey [name], female    DOB: 03-20-1936, 80 y.o.   MRN: [id number]  HPI This is a 80 yo female who presents today for follow up of recent URI. Was seen last week and given azithromycin, prednisone and albuterol. Continues to have frequent, deep cough and some wheezing. Cough is better. Sputum is cloudy, no yellow or blood. No fevers. Finished zpack yesterday and has 1 day of prednisone left. Good appetite. Blood sugar 120 this morning. Good water intake- 8 glasses a day. Coughed a lot last night, but slept well previous two nights. Tessalon perles not helping much.   Past Medical History:  Diagnosis Date  . Anxiety   . Atrial fibrillation ([place])    history  . Coronary artery disease   . Diabetes mellitus    non insulin dependent  . GERD (gastroesophageal reflux disease)   . Heart murmur   . Hyperlipidemia   . Hypertension   . Peripheral vascular disease [facility])    Past Surgical History:  Procedure Laterality Date  . APPENDECTOMY    . CARDIAC CATHETERIZATION    . CATARACT EXTRACTION W/PHACO Left 08/15/2015   Procedure: CATARACT EXTRACTION PHACO AND INTRAOCULAR LENS PLACEMENT (IOC);  Surgeon: Estill Cotta, MD;  Location: [facility] ORS;  Service: Ophthalmology;  Laterality: Left;  [place] 01:28 AP% 26.4 CDE 35.73 fluid pack # [id number]  . COLONOSCOPY WITH PROPOFOL N/A 01/16/2016   Procedure: COLONOSCOPY WITH PROPOFOL;  Surgeon: Manya Silvas, MD;  Location: [facility] ENDOSCOPY;  Service: Endoscopy;  Laterality: N/A;  . CORONARY ANGIOPLASTY WITH STENT PLACEMENT    . CORONARY ARTERY BYPASS GRAFT     with LIMA  to the LAD, SVG to OM1, SVG to PDA  . ESOPHAGOGASTRODUODENOSCOPY (EGD) WITH PROPOFOL N/A 01/16/2016   Procedure: ESOPHAGOGASTRODUODENOSCOPY (EGD) WITH PROPOFOL;  Surgeon: Manya Silvas, MD;  Location: [facility] ENDOSCOPY;  Service: Endoscopy;  Laterality: N/A;  . RENAL ARTERY STENT     left, By Dr Hulda Humphrey  . REPLACEMENT TOTAL KNEE  03/25/2013   right  .  TONSILLECTOMY     Family History  Problem Relation Age of Onset  . Heart disease Father   . Other Brother     open heart surgery  . Cancer Brother   . Other Brother     open heart surgery  . Other Brother     open heart surgery  . Hyperlipidemia Daughter    Social History  Substance Use Topics  . Smoking status: Never Smoker  . Smokeless tobacco: Never Used  . Alcohol use No      Review of Systems Per HPI    Objective:   Physical Exam Physical Exam  Constitutional: Oriented to person, place, and time. She appears well-developed and well-nourished.  HENT:  Head: Normocephalic and atraumatic.  Eyes: Conjunctivae are normal.  Neck: Normal range of motion. Neck supple.  Cardiovascular: Normal rate, regular rhythm and normal heart sounds.   Pulmonary/Chest: Effort normal and breath sounds normal.  Musculoskeletal: Normal range of motion.  Neurological: Alert and oriented to person, place, and time.  Skin: Skin is warm and dry.  Psychiatric: Normal mood and affect. Behavior is normal. Judgment and thought content normal.  Vitals reviewed.    BP (!) 110/58   Pulse 89   Temp 98 F (36.7 C)   Wt 131 lb 6.4 oz (59.6 kg)   SpO2 97%   BMI 23.28 kg/m  Wt Readings from Last 3 Encounters:  08/08/16 131 lb  6.4 oz (59.6 kg)  08/02/16 134 lb 4 oz (60.9 kg)  05/24/16 129 lb 8 oz (58.7 kg)    Assessment & Plan:  1. Lower respiratory infection - improved subjectively and objectively since last week - finish prednisone  2. Wheezing - lungs clear today, can continue albuterol inhaler prn  3. Cough - improving, offered her prescription cough suppressant which she declined  - RTC precautions reviewed  Clarene Reamer, FNP-BC  [facility] at [facility]  08/08/2016 2:37 PM

## 2016-09-19 ENCOUNTER — Other Ambulatory Visit: Payer: Self-pay

## 2016-09-19 MED ORDER — CYANOCOBALAMIN 1000 MCG/ML IJ SOLN: 1000.0000 ug | Freq: Once | INTRAMUSCULAR | 11 refills | Status: AC

## 2016-09-19 NOTE — Telephone Encounter (Signed)
Tracey [name] pts granddaughter request refill one yr for Vit B 12 inj to [facility] in [place]; [place] [facility] will not transfer refills. Advised  Tracey [name] done.

## 2016-09-28 ENCOUNTER — Other Ambulatory Visit: Payer: Self-pay | Admitting: *Deleted

## 2016-09-28 MED ORDER — ONETOUCH ULTRASOFT LANCETS MISC: 12 refills | Status: DC

## 2016-10-01 ENCOUNTER — Telehealth: Payer: Self-pay | Admitting: *Deleted

## 2016-10-01 NOTE — Telephone Encounter (Signed)
pts granddaughter advised per Dr Deborra Medina

## 2016-10-01 NOTE — Telephone Encounter (Signed)
Yes unfortunately that can be a side effect.  I agree with reducing dose to 15 mg daily.  Please update med list and keep me updated wit how Tracey [name] is feeling.

## 2016-10-01 NOTE — Telephone Encounter (Signed)
Spoke to pts granddaughter, Leafy Ro, who states that the pt recently increased Remeron to 30mg , appx 10mo ago, and since doing so has experienced extreme dry mouth. It has also caused her to have dry eye, and has not began to effect her vision. [name], RN advised pt to reduce back to 15mg  as she did not experience these s/s before increasing med. Pls advise

## 2016-10-02 ENCOUNTER — Ambulatory Visit (INDEPENDENT_AMBULATORY_CARE_PROVIDER_SITE_OTHER): Payer: Medicare HMO | Admitting: Family Medicine

## 2016-10-02 ENCOUNTER — Encounter: Payer: Self-pay | Admitting: Family Medicine

## 2016-10-02 VITALS — BP 124/68 | HR 72 | Temp 97.8°F | Ht 63.0 in | Wt 134.0 lb

## 2016-10-02 DIAGNOSIS — B029 Zoster without complications: Secondary | ICD-10-CM | POA: Diagnosis not present

## 2016-10-02 MED ORDER — IBUPROFEN 800 MG PO TABS: 800.0000 mg | ORAL_TABLET | Freq: Three times a day (TID) | ORAL | 1 refills | Status: DC | PRN

## 2016-10-02 MED ORDER — VALACYCLOVIR HCL 1 G PO TABS: 1000.0000 mg | ORAL_TABLET | Freq: Three times a day (TID) | ORAL | 0 refills | Status: DC

## 2016-10-02 MED ORDER — GABAPENTIN 100 MG PO CAPS: 100.0000 mg | ORAL_CAPSULE | Freq: Two times a day (BID) | ORAL | 1 refills | Status: DC | PRN

## 2016-10-02 NOTE — Progress Notes (Signed)
Pre visit review using our clinic review tool, if applicable. No additional management support is needed unless otherwise documented below in the visit note. 

## 2016-10-02 NOTE — Patient Instructions (Addendum)
For shingles take valtrex as directed and finish it  Clean with gentle soap and water  Cover lightly if you are round people  Motrin for pain - stop if GI upset  Gabapentin for pain if needed on top of that- use with caution  Keep [place] updated    Shingles Shingles, which is also known as herpes zoster, is an infection that causes a painful skin rash and fluid-filled blisters. Shingles is not related to genital herpes, which is a sexually transmitted infection. Shingles only develops in people who:  Have had chickenpox.  Have received the chickenpox vaccine. (This is rare.) What are the causes? Shingles is caused by varicella-zoster virus (VZV). This is the same virus that causes chickenpox. After exposure to VZV, the virus stays in the body in an inactive (dormant) state. Shingles develops if the virus reactivates. This can happen many years after the initial exposure to VZV. It is not known what causes this virus to reactivate. What increases the risk? People who have had chickenpox or received the chickenpox vaccine are at risk for shingles. Infection is more common in people who:  Are older than age 59.  Have a weakened defense (immune) system, such as those with HIV, AIDS, or cancer.  Are taking medicines that weaken the immune system, such as transplant medicines.  Are under great stress. What are the signs or symptoms? Early symptoms of this condition include itching, tingling, and pain in an area on your skin. Pain may be described as burning, stabbing, or throbbing. A few days or weeks after symptoms start, a painful red rash appears, usually on one side of the body in a bandlike or beltlike pattern. The rash eventually turns into fluid-filled blisters that break open, scab over, and dry up in about 2-3 weeks. At any time during the infection, you may also develop:  A fever.  Chills.  A headache.  An upset stomach. How is this diagnosed? This condition is diagnosed with  a skin exam. Sometimes, skin or fluid samples are taken from the blisters before a diagnosis is made. These samples are examined under a microscope or sent to a lab for testing. How is this treated? There is no specific cure for this condition. Your health care provider will probably prescribe medicines to help you manage pain, recover more quickly, and avoid long-term problems. Medicines may include:  Antiviral drugs.  Anti-inflammatory drugs.  Pain medicines. If the area involved is on your face, you may be referred to a specialist, such as an eye doctor (ophthalmologist) or an ear, nose, and throat (ENT) doctor to help you avoid eye problems, chronic pain, or disability. Follow these instructions at home: Medicines  Take medicines only as directed by your health care provider.  Apply an anti-itch or numbing cream to the affected area as directed by your health care provider. Blister and Rash Care  Take a cool bath or apply cool compresses to the area of the rash or blisters as directed by your health care provider. This may help with pain and itching.  Keep your rash covered with a loose bandage (dressing). Wear loose-fitting clothing to help ease the pain of material rubbing against the rash.  Keep your rash and blisters clean with mild soap and cool water or as directed by your health care provider.  Check your rash every day for signs of infection. These include redness, swelling, and pain that lasts or increases.  Do not pick your blisters.  Do not scratch  your rash. General instructions  Rest as directed by your health care provider.  Keep all follow-up visits as directed by your health care provider. This is important.  Until your blisters scab over, your infection can cause chickenpox in people who have never had it or been vaccinated against it. To prevent this from happening, avoid contact with other people, especially:  Babies.  Pregnant women.  Children who have  eczema.  Elderly people who have transplants.  People who have chronic illnesses, such as leukemia or AIDS. Contact a health care provider if:  Your pain is not relieved with prescribed medicines.  Your pain does not get better after the rash heals.  Your rash looks infected. Signs of infection include redness, swelling, and pain that lasts or increases. Get help right away if:  The rash is on your face or nose.  You have facial pain, pain around your eye area, or loss of feeling on one side of your face.  You have ear pain or you have ringing in your ear.  You have loss of taste.  Your condition gets worse. This information is not intended to replace advice given to you by your health care provider. Make sure you discuss any questions you have with your health care provider. Document Released: 09/03/2005 Document Revised: 04/29/2016 Document Reviewed: 07/15/2014 Elsevier Interactive Patient Education  2017 [employer].

## 2016-10-02 NOTE — Assessment & Plan Note (Signed)
Painful vesicular rash on T1 dermatome of arm and one small spot on back  Prodrome of pain before then for a week No contacts  Not around immunocomp people  Intol to most pain med Px valtrex 1g tid 7d Px motrin 800 with caution of stomach upset   (bp is good0 Px gabapentin 100 bid prn pain -with caution of sedation and this can be titrated up if needed F/u PCP prn  Handout given Clean with soap and water and lightly cover

## 2016-10-02 NOTE — Progress Notes (Signed)
Subjective:    Patient ID: Tracey [name], female    DOB: 10-Nov-1935, 81 y.o.   MRN: [id number]  HPI Here with suspected shingles on L arm   About a week ago her L arm started hurting -wondered if it was arthritis Yesterday started a stinging rash Tracey Salvo  It is uncomfortable - hurts clothes to touch it   Has never had shingles before  Has not had the vaccine yet   No fever  Feels ok   BP Readings from Last 3 Encounters:  10/02/16 124/68  08/08/16 (!) 110/58  08/02/16 136/64   Cannot take tramadol  Cannot take codeine   Can tolerate nsaid   Patient Active Problem List   Diagnosis Date Noted  . Herpes zoster 10/02/2016  . Depression 04/19/2016  . Fatigue 02/06/2016  . Angina pectoris ([place]) 08/26/2013  . Hypertension 01/22/2012  . Coronary heart disease 01/22/2012  . S/P CABG x 3 01/22/2012  . Hyperlipidemia 01/22/2012  . Diabetes mellitus ([place]) 01/22/2012  . Renal artery stenosis ([place]) 01/22/2012   Past Medical History:  Diagnosis Date  . Anxiety   . Atrial fibrillation ([place])    history  . Coronary artery disease   . Diabetes mellitus    non insulin dependent  . GERD (gastroesophageal reflux disease)   . Heart murmur   . Hyperlipidemia   . Hypertension   . Peripheral vascular disease [facility])    Past Surgical History:  Procedure Laterality Date  . APPENDECTOMY    . CARDIAC CATHETERIZATION    . CATARACT EXTRACTION W/PHACO Left 08/15/2015   Procedure: CATARACT EXTRACTION PHACO AND INTRAOCULAR LENS PLACEMENT (IOC);  Surgeon: Estill Cotta, MD;  Location: [facility] ORS;  Service: Ophthalmology;  Laterality: Left;  [place] 01:28 AP% 26.4 CDE 35.73 fluid pack # [id number]  . COLONOSCOPY WITH PROPOFOL N/A 01/16/2016   Procedure: COLONOSCOPY WITH PROPOFOL;  Surgeon: Manya Silvas, MD;  Location: [facility] ENDOSCOPY;  Service: Endoscopy;  Laterality: N/A;  . CORONARY ANGIOPLASTY WITH STENT PLACEMENT    . CORONARY ARTERY BYPASS GRAFT     with LIMA  to the LAD, SVG to  OM1, SVG to PDA  . ESOPHAGOGASTRODUODENOSCOPY (EGD) WITH PROPOFOL N/A 01/16/2016   Procedure: ESOPHAGOGASTRODUODENOSCOPY (EGD) WITH PROPOFOL;  Surgeon: Manya Silvas, MD;  Location: [facility] ENDOSCOPY;  Service: Endoscopy;  Laterality: N/A;  . RENAL ARTERY STENT     left, By Dr Hulda Humphrey  . REPLACEMENT TOTAL KNEE  03/25/2013   right  . TONSILLECTOMY     Social History  Substance Use Topics  . Smoking status: Never Smoker  . Smokeless tobacco: Never Used  . Alcohol use No   Family History  Problem Relation Age of Onset  . Heart disease Father   . Other Brother     open heart surgery  . Cancer Brother   . Other Brother     open heart surgery  . Other Brother     open heart surgery  . Hyperlipidemia Daughter    Allergies  Allergen Reactions  . Ciprofloxacin Other (See Comments)  . Codeine     Nausea   . Erythromycin Nausea And Vomiting  . Tramadol    Current Outpatient Prescriptions on File Prior to Visit  Medication Sig Dispense Refill  . albuterol (PROVENTIL HFA;VENTOLIN HFA) 108 (90 Base) MCG/ACT inhaler Inhale 2 puffs into the lungs every 4 (four) hours as needed for wheezing or shortness of breath (cough, shortness of breath or wheezing.). 1 Inhaler 1  . aspirin 81  MG tablet Take 81 mg by mouth daily.     . benzonatate (TESSALON) 100 MG capsule Take 1-2 capsules (100-200 mg total) by mouth 3 (three) times daily as needed for cough. 20 capsule 0  . Calcium Carbonate-Vit D-Min (CALTRATE 600+D PLUS PO) Take 200 Units by mouth daily.    . clopidogrel (PLAVIX) 75 MG tablet Take 1 tablet (75 mg total) by mouth daily. 90 tablet 3  . furosemide (LASIX) 20 MG tablet Take 1 tablet (20 mg total) by mouth daily as needed for fluid (shortness of breath). 30 tablet 6  . glucose blood (ONE TOUCH TEST STRIPS) test strip USE AS DIRECTED TO CHECK BLOOD SUGAR ONCE DAILY E08.00 100 each 5  . Lancets (ONETOUCH ULTRASOFT) lancets USE AS INSTRUCTED TO TEST BLOOD SUGAR ONCE DAILY E08.00 100 each 12  .  lisinopril (PRINIVIL,ZESTRIL) 20 MG tablet Take 1 tablet (20 mg total) by mouth daily. 90 tablet 3  . metFORMIN (GLUCOPHAGE) 1000 MG tablet TAKE 1 TABLET BY MOUTH TWICE DAILY    . metoprolol succinate (TOPROL-XL) 25 MG 24 hr tablet Take 1 tablet (25 mg total) by mouth daily. (Patient taking differently: Take 12.5 mg by mouth daily. ) 90 tablet 3  . mirtazapine (REMERON) 30 MG tablet Take 1 tablet (30 mg total) by mouth at bedtime. (Patient taking differently: Take 15 mg by mouth at bedtime. ) 30 tablet 3  . Multiple Vitamins-Minerals (MULTIVITAMIN WITH MINERALS) tablet Take 1 tablet by mouth daily.    . ondansetron (ZOFRAN) 4 MG tablet Take 1 tablet (4 mg total) by mouth every 8 (eight) hours as needed for nausea or vomiting. 20 tablet 0  . pantoprazole (PROTONIX) 40 MG tablet Take 40 mg by mouth daily.    . potassium chloride (K-DUR) 10 MEQ tablet Take 1 tablet (10 mEq total) by mouth daily as needed (take w/ furosemide). 30 tablet 6  . rosuvastatin (CRESTOR) 40 MG tablet Take 1 tablet (40 mg total) by mouth daily. 90 tablet 4  . nitroGLYCERIN (NITROSTAT) 0.4 MG SL tablet Place 1 tablet (0.4 mg total) under the tongue every 5 (five) minutes as needed for chest pain. 25 tablet 6   No current facility-administered medications on file prior to visit.     Review of Systems Review of Systems  Constitutional: Negative for fever, appetite change, fatigue and unexpected weight change.  Eyes: Negative for pain and visual disturbance.  Respiratory: Negative for cough and shortness of breath.   Cardiovascular: Negative for cp or palpitations    Gastrointestinal: Negative for nausea, diarrhea and constipation.  Genitourinary: Negative for urgency and frequency.  Skin: Negative for pallor and pos for painful rash on arm  Neurological: Negative for weakness, light-headedness, numbness and headaches.  Hematological: Negative for adenopathy. Does not bruise/bleed easily.  Psychiatric/Behavioral: Negative  for dysphoric mood. The patient is not nervous/anxious.         Objective:   Physical Exam  Constitutional: She appears well-developed and well-nourished. No distress.  HENT:  Head: Normocephalic and atraumatic.  Mouth/Throat: Oropharynx is clear and moist.  Eyes: Conjunctivae and EOM are normal. Pupils are equal, round, and reactive to light. Right eye exhibits no discharge. Left eye exhibits no discharge.  Neck: Normal range of motion. Neck supple.  Cardiovascular: Normal rate and regular rhythm.   Pulmonary/Chest: Effort normal and breath sounds normal.  Lymphadenopathy:    She has no cervical adenopathy.  Neurological: She is alert. She has normal reflexes. No cranial nerve deficit.  Skin: Skin  is warm and dry. Rash noted.  Patchy rash with clusters of vesicles on T1 dermatome of L arm and one patch on back  Psychiatric: She has a normal mood and affect.          Assessment & Plan:   Problem List Items Addressed This Visit      Other   Herpes zoster    Painful vesicular rash on T1 dermatome of arm and one small spot on back  Prodrome of pain before then for a week No contacts  Not around immunocomp people  Intol to most pain med Px valtrex 1g tid 7d Px motrin 800 with caution of stomach upset   (bp is good0 Px gabapentin 100 bid prn pain -with caution of sedation and this can be titrated up if needed F/u PCP prn  Handout given Clean with soap and water and lightly cover       Relevant Medications   valACYclovir (VALTREX) 1000 MG tablet

## 2016-10-15 ENCOUNTER — Other Ambulatory Visit: Payer: Self-pay | Admitting: *Deleted

## 2016-10-18 NOTE — Telephone Encounter (Signed)
Pt called our office to find out about refill. We advised her Dr Karl Bales had it. She will be out of medication tomorrow

## 2016-10-19 ENCOUNTER — Telehealth: Payer: Self-pay | Admitting: Cardiovascular Disease

## 2016-10-19 ENCOUNTER — Other Ambulatory Visit: Payer: Self-pay | Admitting: *Deleted

## 2016-10-19 MED ORDER — CLOPIDOGREL BISULFATE 75 MG PO TABS: 75.0000 mg | ORAL_TABLET | Freq: Every day | ORAL | 3 refills | Status: DC

## 2016-10-19 NOTE — Telephone Encounter (Signed)
°*  STAT* If patient is at the pharmacy, call can be transferred to refill team.   1. Which medications need to be refilled? (please list name of each medication and dose if known) Plavix 75  Po daily   2. Which pharmacy/location (including street and city if local pharmacy) [facility] [address]  3. Do they need a 30 day or 90 day supply? 90  She is out of medication

## 2016-10-19 NOTE — Telephone Encounter (Signed)
Requested Prescriptions   Signed Prescriptions Disp Refills  . clopidogrel (PLAVIX) 75 MG tablet 90 tablet 3    Sig: Take 1 tablet (75 mg total) by mouth daily.    Authorizing Provider: GOLLAN, TIMOTHY J    Ordering User: Demarious Kapur C    

## 2016-10-19 NOTE — Telephone Encounter (Signed)
Requested Prescriptions   Signed Prescriptions Disp Refills  . clopidogrel (PLAVIX) 75 MG tablet 90 tablet 3    Sig: Take 1 tablet (75 mg total) by mouth daily.    Authorizing Provider: GOLLAN, TIMOTHY J    Ordering User: LOPEZ, MARINA C    

## 2016-10-24 ENCOUNTER — Institutional Professional Consult (permissible substitution): Payer: Medicare HMO | Admitting: Family Medicine

## 2016-10-24 ENCOUNTER — Other Ambulatory Visit: Payer: Medicare HMO

## 2016-10-26 ENCOUNTER — Ambulatory Visit (INDEPENDENT_AMBULATORY_CARE_PROVIDER_SITE_OTHER): Payer: Medicare HMO

## 2016-10-26 VITALS — BP 118/70 | HR 66 | Temp 98.1°F | Ht 63.0 in | Wt 133.0 lb

## 2016-10-26 DIAGNOSIS — I10 Essential (primary) hypertension: Secondary | ICD-10-CM

## 2016-10-26 DIAGNOSIS — E538 Deficiency of other specified B group vitamins: Secondary | ICD-10-CM

## 2016-10-26 DIAGNOSIS — Z130 Encounter for screening for diseases of the blood and blood-forming organs and certain disorders involving the immune mechanism: Secondary | ICD-10-CM

## 2016-10-26 DIAGNOSIS — E784 Other hyperlipidemia: Secondary | ICD-10-CM

## 2016-10-26 DIAGNOSIS — Z0000 Encounter for general adult medical examination without abnormal findings: Secondary | ICD-10-CM

## 2016-10-26 DIAGNOSIS — E7849 Other hyperlipidemia: Secondary | ICD-10-CM

## 2016-10-26 DIAGNOSIS — E0801 Diabetes mellitus due to underlying condition with hyperosmolarity with coma: Secondary | ICD-10-CM

## 2016-10-26 LAB — CBC WITH DIFFERENTIAL/PLATELET
BASOS ABS: 0 10*3/uL (ref 0.0–0.1)
Basophils Relative: 0.6 % (ref 0.0–3.0)
EOS ABS: 0.1 10*3/uL (ref 0.0–0.7)
EOS PCT: 2.3 % (ref 0.0–5.0)
HCT: 33.2 % — ABNORMAL LOW (ref 36.0–46.0)
Hemoglobin: 10.8 g/dL — ABNORMAL LOW (ref 12.0–15.0)
LYMPHS ABS: 1.7 10*3/uL (ref 0.7–4.0)
Lymphocytes Relative: 41.3 % (ref 12.0–46.0)
MCHC: 32.6 g/dL (ref 30.0–36.0)
MCV: 81.4 fl (ref 78.0–100.0)
MONO ABS: 0.4 10*3/uL (ref 0.1–1.0)
Monocytes Relative: 10 % (ref 3.0–12.0)
NEUTROS PCT: 45.8 % (ref 43.0–77.0)
Neutro Abs: 1.8 10*3/uL (ref 1.4–7.7)
Platelets: 199 10*3/uL (ref 150.0–400.0)
RBC: 4.08 Mil/uL (ref 3.87–5.11)
RDW: 16 % — ABNORMAL HIGH (ref 11.5–15.5)
WBC: 4 10*3/uL (ref 4.0–10.5)

## 2016-10-26 LAB — COMPREHENSIVE METABOLIC PANEL
ALK PHOS: 47 U/L (ref 39–117)
ALT: 10 U/L (ref 0–35)
AST: 18 U/L (ref 0–37)
Albumin: 4.5 g/dL (ref 3.5–5.2)
BUN: 33 mg/dL — AB (ref 6–23)
CO2: 29 mEq/L (ref 19–32)
Calcium: 10.4 mg/dL (ref 8.4–10.5)
Chloride: 104 mEq/L (ref 96–112)
Creatinine, Ser: 0.88 mg/dL (ref 0.40–1.20)
GFR: 66.02 mL/min (ref >60.00)
GLUCOSE: 134 mg/dL — AB (ref 70–99)
POTASSIUM: 4.5 meq/L (ref 3.5–5.1)
SODIUM: 140 meq/L (ref 135–145)
TOTAL PROTEIN: 6.9 g/dL (ref 6.0–8.3)
Total Bilirubin: 0.6 mg/dL (ref 0.2–1.2)

## 2016-10-26 LAB — LIPID PANEL
CHOL/HDL RATIO: 4
Cholesterol: 148 mg/dL (ref 0–200)
HDL: 40.1 mg/dL (ref >39.00)
LDL CALC: 82 mg/dL (ref 0–99)
NonHDL: 107.62
TRIGLYCERIDES: 129 mg/dL (ref 0.0–149.0)
VLDL: 25.8 mg/dL (ref 0.0–40.0)

## 2016-10-26 LAB — HEMOGLOBIN A1C: Hgb A1c MFr Bld: 7.9 % — ABNORMAL HIGH (ref 4.6–6.5)

## 2016-10-26 LAB — VITAMIN B12: Vitamin B-12: 278 pg/mL (ref 211–911)

## 2016-10-26 NOTE — Patient Instructions (Signed)
[name] , Thank you for taking time to come for your Medicare Wellness Visit. I appreciate your ongoing commitment to your health goals. Please review the following plan we discussed and let me know if I can assist you in the future.   These are the goals we discussed: Goals    . Increase physical activity          Starting 10/26/2016, I will continue to walk at least 30 min 3-4 days per week.        This is a list of the screening recommended for you and due dates:  Health Maintenance  Topic Date Due  . Complete foot exam   10/26/2017*  . Shingles Vaccine  10/26/2017*  . Pneumonia vaccines (1 of 2 - PCV13) 10/26/2017*  . Hemoglobin A1C  04/25/2017  . Eye exam for diabetics  05/31/2017  . Tetanus Vaccine  03/18/2023  . Flu Shot  Completed  . DEXA scan (bone density measurement)  Addressed  *Topic was postponed. The date shown is not the original due date.   Preventive Care for Adults  A healthy lifestyle and preventive care can promote health and wellness. Preventive health guidelines for adults include the following key practices.  . A routine yearly physical is a good way to check with your health care provider about your health and preventive screening. It is a chance to share any concerns and updates on your health and to receive a thorough exam.  . Visit your dentist for a routine exam and preventive care every 6 months. Brush your teeth twice a day and floss once a day. Good oral hygiene prevents tooth decay and gum disease.  . The frequency of eye exams is based on your age, health, family medical history, use  of contact lenses, and other factors. Follow your health care provider's ecommendations for frequency of eye exams.  . Eat a healthy diet. Foods like vegetables, fruits, whole grains, low-fat dairy products, and lean protein foods contain the nutrients you need without too many calories. Decrease your intake of foods high in solid fats, added sugars, and salt. Eat  the right amount of calories for you. Get information about a proper diet from your health care provider, if necessary.  . Regular physical exercise is one of the most important things you can do for your health. Most adults should get at least 150 minutes of moderate-intensity exercise (any activity that increases your heart rate and causes you to sweat) each week. In addition, most adults need muscle-strengthening exercises on 2 or more days a week.  Silver Sneakers may be a benefit available to you. To determine eligibility, you may visit the website: www.silversneakers.com or contact program at [phone] Mon-Fri between 8AM-8PM.   . Maintain a healthy weight. The body mass index (BMI) is a screening tool to identify possible weight problems. It provides an estimate of body fat based on height and weight. Your health care provider can find your BMI and can help you achieve or maintain a healthy weight.   For adults 20 years and older: ? A BMI below 18.5 is considered underweight. ? A BMI of 18.5 to 24.9 is normal. ? A BMI of 25 to 29.9 is considered overweight. ? A BMI of 30 and above is considered obese.   . Maintain normal blood lipids and cholesterol levels by exercising and minimizing your intake of saturated fat. Eat a balanced diet with plenty of fruit and vegetables. Blood tests for lipids and cholesterol  should begin at age 66 and be repeated every 5 years. If your lipid or cholesterol levels are high, you are over 50, or you are at high risk for heart disease, you may need your cholesterol levels checked more frequently. Ongoing high lipid and cholesterol levels should be treated with medicines if diet and exercise are not working.  . If you smoke, find out from your health care provider how to quit. If you do not use tobacco, please do not start.  . If you choose to drink alcohol, please do not consume more than 2 drinks per day. One drink is considered to be 12 ounces (355 mL)  of beer, 5 ounces (148 mL) of wine, or 1.5 ounces (44 mL) of liquor.  . If you are 50-43 years old, ask your health care provider if you should take aspirin to prevent strokes.  . Use sunscreen. Apply sunscreen liberally and repeatedly throughout the day. You should seek shade when your shadow is shorter than you. Protect yourself by wearing long sleeves, pants, a wide-brimmed hat, and sunglasses year round, whenever you are outdoors.  . Once a month, do a whole body skin exam, using a mirror to look at the skin on your back. Tell your health care provider of new moles, moles that have irregular borders, moles that are larger than a pencil eraser, or moles that have changed in shape or color.

## 2016-10-26 NOTE — Progress Notes (Signed)
Subjective:   Tracey [name] is a 81 y.o. female who presents for an Initial Medicare Annual Wellness Visit.  Review of Systems    N/A  Cardiac Risk Factors include: advanced age (>88men, >15 women);diabetes mellitus;dyslipidemia;hypertension     Objective:    Today's Vitals   10/26/16 1001  BP: 118/70  Pulse: 66  Temp: 98.1 F (36.7 C)  TempSrc: Oral  SpO2: 98%  Weight: 133 lb (60.3 kg)  Height: 5\' 3"  (1.6 m)  PainSc: 0-No pain   Body mass index is 23.56 kg/m.   Current Medications (verified) Outpatient Encounter Prescriptions as of 10/26/2016  Medication Sig  . aspirin 81 MG tablet Take 81 mg by mouth daily.   . Calcium Carbonate-Vit D-Min (CALTRATE 600+D PLUS PO) Take 200 Units by mouth daily.  . clopidogrel (PLAVIX) 75 MG tablet Take 1 tablet (75 mg total) by mouth daily.  Marland Kitchen gabapentin (NEURONTIN) 100 MG capsule Take 1 capsule (100 mg total) by mouth 2 (two) times daily as needed. Caution of sedation or dizziness  . glucose blood (ONE TOUCH TEST STRIPS) test strip USE AS DIRECTED TO CHECK BLOOD SUGAR ONCE DAILY E08.00  . Lancets (ONETOUCH ULTRASOFT) lancets USE AS INSTRUCTED TO TEST BLOOD SUGAR ONCE DAILY E08.00  . lisinopril (PRINIVIL,ZESTRIL) 20 MG tablet Take 1 tablet (20 mg total) by mouth daily.  . metFORMIN (GLUCOPHAGE) 1000 MG tablet TAKE 1 TABLET BY MOUTH TWICE DAILY  . metoprolol succinate (TOPROL-XL) 25 MG 24 hr tablet Take 1 tablet (25 mg total) by mouth daily. (Patient taking differently: Take 12.5 mg by mouth daily. )  . mirtazapine (REMERON) 30 MG tablet Take 1 tablet (30 mg total) by mouth at bedtime. (Patient taking differently: Take 15 mg by mouth at bedtime. )  . Multiple Vitamins-Minerals (MULTIVITAMIN WITH MINERALS) tablet Take 1 tablet by mouth daily.  . pantoprazole (PROTONIX) 40 MG tablet Take 40 mg by mouth daily.  . rosuvastatin (CRESTOR) 40 MG tablet Take 1 tablet (40 mg total) by mouth daily.  . [DISCONTINUED] albuterol (PROVENTIL  HFA;VENTOLIN HFA) 108 (90 Base) MCG/ACT inhaler Inhale 2 puffs into the lungs every 4 (four) hours as needed for wheezing or shortness of breath (cough, shortness of breath or wheezing.).  . [DISCONTINUED] benzonatate (TESSALON) 100 MG capsule Take 1-2 capsules (100-200 mg total) by mouth 3 (three) times daily as needed for cough.  . [DISCONTINUED] furosemide (LASIX) 20 MG tablet Take 1 tablet (20 mg total) by mouth daily as needed for fluid (shortness of breath).  . [DISCONTINUED] ibuprofen (ADVIL,MOTRIN) 800 MG tablet Take 1 tablet (800 mg total) by mouth every 8 (eight) hours as needed. Take with a meal  . [DISCONTINUED] ondansetron (ZOFRAN) 4 MG tablet Take 1 tablet (4 mg total) by mouth every 8 (eight) hours as needed for nausea or vomiting.  . [DISCONTINUED] potassium chloride (K-DUR) 10 MEQ tablet Take 1 tablet (10 mEq total) by mouth daily as needed (take w/ furosemide).  . [DISCONTINUED] valACYclovir (VALTREX) 1000 MG tablet Take 1 tablet (1,000 mg total) by mouth 3 (three) times daily.  . nitroGLYCERIN (NITROSTAT) 0.4 MG SL tablet Place 1 tablet (0.4 mg total) under the tongue every 5 (five) minutes as needed for chest pain.   No facility-administered encounter medications on file as of 10/26/2016.     Allergies (verified) Ciprofloxacin; Codeine; Erythromycin; and Tramadol   History: Past Medical History:  Diagnosis Date  . Anxiety   . Atrial fibrillation ([place])    history  . Coronary artery  disease   . Diabetes mellitus    non insulin dependent  . GERD (gastroesophageal reflux disease)   . Heart murmur   . Hyperlipidemia   . Hypertension   . Peripheral vascular disease [facility])    Past Surgical History:  Procedure Laterality Date  . APPENDECTOMY    . CARDIAC CATHETERIZATION    . CATARACT EXTRACTION W/PHACO Left 08/15/2015   Procedure: CATARACT EXTRACTION PHACO AND INTRAOCULAR LENS PLACEMENT (IOC);  Surgeon: Estill Cotta, MD;  Location: [facility] ORS;  Service: Ophthalmology;   Laterality: Left;  [place] 01:28 AP% 26.4 CDE 35.73 fluid pack # [id number]  . COLONOSCOPY WITH PROPOFOL N/A 01/16/2016   Procedure: COLONOSCOPY WITH PROPOFOL;  Surgeon: Manya Silvas, MD;  Location: [facility] ENDOSCOPY;  Service: Endoscopy;  Laterality: N/A;  . CORONARY ANGIOPLASTY WITH STENT PLACEMENT    . CORONARY ARTERY BYPASS GRAFT     with LIMA  to the LAD, SVG to OM1, SVG to PDA  . ESOPHAGOGASTRODUODENOSCOPY (EGD) WITH PROPOFOL N/A 01/16/2016   Procedure: ESOPHAGOGASTRODUODENOSCOPY (EGD) WITH PROPOFOL;  Surgeon: Manya Silvas, MD;  Location: [facility] ENDOSCOPY;  Service: Endoscopy;  Laterality: N/A;  . RENAL ARTERY STENT     left, By Dr Hulda Humphrey  . REPLACEMENT TOTAL KNEE  03/25/2013   right  . TONSILLECTOMY     Family History  Problem Relation Age of Onset  . Heart disease Father   . Other Brother     open heart surgery  . Cancer Brother   . Other Brother     open heart surgery  . Other Brother     open heart surgery  . Hyperlipidemia Daughter    Social History   Occupational History  . Not on file.   Social History Main Topics  . Smoking status: Never Smoker  . Smokeless tobacco: Never Used  . Alcohol use No  . Drug use: No  . Sexual activity: No    Tobacco Counseling Counseling given: No   Activities of Daily Living In your present state of health, do you have any difficulty performing the following activities: 10/26/2016  Hearing? N  Vision? N  Difficulty concentrating or making decisions? N  Walking or climbing stairs? Y  Dressing or bathing? N  Doing errands, shopping? Y  Preparing Food and eating ? N  Using the Toilet? N  In the past six months, have you accidently leaked urine? Y  Do you have problems with loss of bowel control? N  Managing your Medications? N  Managing your Finances? N  Housekeeping or managing your Housekeeping? N  Some recent data might be hidden    Immunizations and Health Maintenance Immunization History  Administered Date(s)  Administered  . Influenza,inj,Quad PF,36+ Mos 05/24/2016  . Td 03/17/2013   There are no preventive care reminders to display for this patient.  Patient Care Team: Lucille Passy, MD as PCP - General (Family Medicine) Manya Silvas, MD (Gastroenterology) Minna Merritts, MD as Consulting Physician (Cardiology) Estill Cotta, MD as Consulting Physician (Ophthalmology)    Assessment:     Hearing/Vision screen  Hearing Screening   125Hz  250Hz  500Hz  1000Hz  2000Hz  3000Hz  4000Hz  6000Hz  8000Hz   Right ear:   40 0 40  0    Left ear:   0 0 40  0    Vision Screening Comments: Last vision exam in 2017 with Dr. Sandra Cockayne  Dietary issues and exercise activities discussed: Current Exercise Habits: Home exercise routine, Type of exercise: walking, Time (Minutes): 30, Frequency (Times/Week): 4,  Weekly Exercise (Minutes/Week): 120, Intensity: Mild, Exercise limited by: None identified  Goals    . Increase physical activity          Starting 10/26/2016, I will continue to walk at least 30 min 3-4 days per week.       Depression Screen PHQ 2/9 Scores 10/26/2016  PHQ - 2 Score 0    Fall Risk  10/26/2016  Falls in the past year? Yes  Number falls in past yr: 1  Injury with Fall? No   Cognitive Function: MMSE - Mini Mental State Exam 10/26/2016  Orientation to time 5  Orientation to Place 5  Registration 3  Attention/ Calculation 0  Recall 3  Language- name 2 objects 0  Language- repeat 1  Language- follow 3 step command 3  Language- read & follow direction 0  Write a sentence 0  Copy design 0  Total score 20     PLEASE NOTE: A Mini-Cog screen was completed. Maximum score is 20. A value of 0 denotes this part of [place] MMSE was not completed or the patient failed this part of the Mini-Cog screening.   Mini-Cog Screening Orientation to Time - Max 5 pts Orientation to Place - Max 5 pts Registration - Max 3 pts Recall - Max 3 pts Language Repeat - Max 1 pts Language Follow 3  Step Command - Max 3 pts     Screening Tests Health Maintenance  Topic Date Due  . FOOT EXAM  10/26/2017 (Originally 08/17/1946)  . ZOSTAVAX  10/26/2017 (Originally 08/17/1998)  . PNA vac Low Risk Adult (1 of 2 - PCV13) 10/26/2017 (Originally 08/18/2003)  . HEMOGLOBIN A1C  04/25/2017  . OPHTHALMOLOGY EXAM  05/31/2017  . TETANUS/TDAP  03/18/2023  . INFLUENZA VACCINE  Completed  . DEXA SCAN  Addressed      Plan:    I have personally reviewed and addressed the Medicare Annual Wellness questionnaire and have noted the following in the patient's chart:  A. Medical and social history B. Use of alcohol, tobacco or illicit drugs  C. Current medications and supplements D. Functional ability and status E.  Nutritional status F.  Physical activity G. Advance directives H. List of other physicians I.  Hospitalizations, surgeries, and ER visits in previous 12 months J.  [place] to include hearing, vision, cognitive, depression L. Referrals and appointments - none  In addition, I have reviewed and discussed with patient certain preventive protocols, quality metrics, and best practice recommendations. A written personalized care plan for preventive services as well as general preventive health recommendations were provided to patient.  See attached scanned questionnaire for additional information.   Signed,   Lindell Noe, MHA, BS, LPN Health Coach

## 2016-10-26 NOTE — Progress Notes (Signed)
PCP notes:   Health maintenance:  Foot exam - PCP will complete at next appt Shingles - addressed PCV13 - pt will review records as she believes she has already taken vaccine Bone density - per pt, exam in 2014 Tetanus vaccine - per pt, completed in 2014 A1C - completed  Abnormal screenings:   Hearing - failed Fall risk - hx of fall without injury  Patient concerns:   None  Nurse concerns:  None  Next PCP appt:   11/05/16 @ 1200

## 2016-10-26 NOTE — Progress Notes (Signed)
I reviewed health advisor's note, was available for consultation, and agree with documentation and plan.  

## 2016-10-26 NOTE — Progress Notes (Signed)
Pre visit review using our clinic review tool, if applicable. No additional management support is needed unless otherwise documented below in the visit note. 

## 2016-10-30 ENCOUNTER — Other Ambulatory Visit: Payer: Self-pay | Admitting: Family Medicine

## 2016-10-30 DIAGNOSIS — Z1231 Encounter for screening mammogram for malignant neoplasm of breast: Secondary | ICD-10-CM

## 2016-11-05 ENCOUNTER — Encounter: Payer: Self-pay | Admitting: Family Medicine

## 2016-11-05 ENCOUNTER — Ambulatory Visit (INDEPENDENT_AMBULATORY_CARE_PROVIDER_SITE_OTHER): Payer: Medicare HMO | Admitting: Family Medicine

## 2016-11-05 VITALS — BP 124/68 | HR 65 | Temp 97.6°F | Ht 63.0 in | Wt 134.8 lb

## 2016-11-05 DIAGNOSIS — F32A Depression, unspecified: Secondary | ICD-10-CM

## 2016-11-05 DIAGNOSIS — E0800 Diabetes mellitus due to underlying condition with hyperosmolarity without nonketotic hyperglycemic-hyperosmolar coma (NKHHC): Secondary | ICD-10-CM

## 2016-11-05 DIAGNOSIS — I10 Essential (primary) hypertension: Secondary | ICD-10-CM | POA: Diagnosis not present

## 2016-11-05 DIAGNOSIS — I2510 Atherosclerotic heart disease of native coronary artery without angina pectoris: Secondary | ICD-10-CM | POA: Diagnosis not present

## 2016-11-05 DIAGNOSIS — E7849 Other hyperlipidemia: Secondary | ICD-10-CM

## 2016-11-05 DIAGNOSIS — E538 Deficiency of other specified B group vitamins: Secondary | ICD-10-CM

## 2016-11-05 DIAGNOSIS — Z23 Encounter for immunization: Secondary | ICD-10-CM

## 2016-11-05 DIAGNOSIS — Z01419 Encounter for gynecological examination (general) (routine) without abnormal findings: Secondary | ICD-10-CM | POA: Diagnosis not present

## 2016-11-05 DIAGNOSIS — F329 Major depressive disorder, single episode, unspecified: Secondary | ICD-10-CM | POA: Diagnosis not present

## 2016-11-05 DIAGNOSIS — E784 Other hyperlipidemia: Secondary | ICD-10-CM | POA: Diagnosis not present

## 2016-11-05 DIAGNOSIS — R69 Illness, unspecified: Secondary | ICD-10-CM | POA: Diagnosis not present

## 2016-11-05 DIAGNOSIS — I2583 Coronary atherosclerosis due to lipid rich plaque: Secondary | ICD-10-CM

## 2016-11-05 NOTE — Assessment & Plan Note (Signed)
At goal. No changes made today. 

## 2016-11-05 NOTE — Assessment & Plan Note (Signed)
Deteriorated but she is hesitant to change rxs. Motivated to make dietary changes which we did discuss today. Follow up in 3 months.

## 2016-11-05 NOTE — Addendum Note (Signed)
Addended by: Modena Nunnery on: 11/05/2016 12:39 PM   Modules accepted: Orders

## 2016-11-05 NOTE — Progress Notes (Signed)
Subjective:   Patient ID: Tracey [name], female    DOB: 1935-10-06, 81 y.o.   MRN: [id number]  Tracey [name] is a pleasant 81 y.o. year old female who presents to clinic today with Annual Exam  on 11/05/2016  HPI:   Annual medicare wellness visit with Candis Musa, RN Notes reviewed.  Colonoscopy 01/16/16 Mammogram scheduled for 12/04/16 Sees GYN  Diabetes- deteriorated.  Compliant with Metformin but admits to eating a lot of pasta.  Checks FSBS every morning and has been running higher in 150s.  Lab Results  Component Value Date   HGBA1C 7.9 (H) 10/26/2016   Lab Results  Component Value Date   CHOL 148 10/26/2016   HDL 40.10 10/26/2016   LDLCALC 82 10/26/2016   TRIG 129.0 10/26/2016   CHOLHDL 4 10/26/2016    Also on ACEI.  CAD- sp CABG in 1995 and stenting in 2013- Sees Dr. Rockey Situ.   She is on betablocker, plavix, statin..  Depression- decreased Remeron to 15 mg qhs.  Current Outpatient Prescriptions on File Prior to Visit  Medication Sig Dispense Refill  . aspirin 81 MG tablet Take 81 mg by mouth daily.     . Calcium Carbonate-Vit D-Min (CALTRATE 600+D PLUS PO) Take 200 Units by mouth daily.    . clopidogrel (PLAVIX) 75 MG tablet Take 1 tablet (75 mg total) by mouth daily. 90 tablet 3  . glucose blood (ONE TOUCH TEST STRIPS) test strip USE AS DIRECTED TO CHECK BLOOD SUGAR ONCE DAILY E08.00 100 each 5  . Lancets (ONETOUCH ULTRASOFT) lancets USE AS INSTRUCTED TO TEST BLOOD SUGAR ONCE DAILY E08.00 100 each 12  . lisinopril (PRINIVIL,ZESTRIL) 20 MG tablet Take 1 tablet (20 mg total) by mouth daily. 90 tablet 3  . metFORMIN (GLUCOPHAGE) 1000 MG tablet TAKE 1 TABLET BY MOUTH TWICE DAILY    . metoprolol succinate (TOPROL-XL) 25 MG 24 hr tablet Take 1 tablet (25 mg total) by mouth daily. (Patient taking differently: Take 12.5 mg by mouth daily. ) 90 tablet 3  . mirtazapine (REMERON) 30 MG tablet Take 1 tablet (30 mg total) by mouth at bedtime. (Patient taking  differently: Take 15 mg by mouth at bedtime. ) 30 tablet 3  . Multiple Vitamins-Minerals (MULTIVITAMIN WITH MINERALS) tablet Take 1 tablet by mouth daily.    . pantoprazole (PROTONIX) 40 MG tablet Take 40 mg by mouth daily.    . rosuvastatin (CRESTOR) 40 MG tablet Take 1 tablet (40 mg total) by mouth daily. 90 tablet 4  . nitroGLYCERIN (NITROSTAT) 0.4 MG SL tablet Place 1 tablet (0.4 mg total) under the tongue every 5 (five) minutes as needed for chest pain. 25 tablet 6   No current facility-administered medications on file prior to visit.     Allergies  Allergen Reactions  . Ciprofloxacin Other (See Comments)  . Codeine     Nausea   . Erythromycin Nausea And Vomiting  . Tramadol     Past Medical History:  Diagnosis Date  . Anxiety   . Atrial fibrillation ([place])    history  . Coronary artery disease   . Diabetes mellitus    non insulin dependent  . GERD (gastroesophageal reflux disease)   . Heart murmur   . Hyperlipidemia   . Hypertension   . Peripheral vascular disease [facility])     Past Surgical History:  Procedure Laterality Date  . APPENDECTOMY    . CARDIAC CATHETERIZATION    . CATARACT EXTRACTION W/PHACO Left 08/15/2015   Procedure:  CATARACT EXTRACTION PHACO AND INTRAOCULAR LENS PLACEMENT (IOC);  Surgeon: Estill Cotta, MD;  Location: [facility] ORS;  Service: Ophthalmology;  Laterality: Left;  [place] 01:28 AP% 26.4 CDE 35.73 fluid pack # [id number]  . COLONOSCOPY WITH PROPOFOL N/A 01/16/2016   Procedure: COLONOSCOPY WITH PROPOFOL;  Surgeon: Manya Silvas, MD;  Location: [facility] ENDOSCOPY;  Service: Endoscopy;  Laterality: N/A;  . CORONARY ANGIOPLASTY WITH STENT PLACEMENT    . CORONARY ARTERY BYPASS GRAFT     with LIMA  to the LAD, SVG to OM1, SVG to PDA  . ESOPHAGOGASTRODUODENOSCOPY (EGD) WITH PROPOFOL N/A 01/16/2016   Procedure: ESOPHAGOGASTRODUODENOSCOPY (EGD) WITH PROPOFOL;  Surgeon: Manya Silvas, MD;  Location: [facility] ENDOSCOPY;  Service: Endoscopy;  Laterality: N/A;  .  RENAL ARTERY STENT     left, By Dr Hulda Humphrey  . REPLACEMENT TOTAL KNEE  03/25/2013   right  . TONSILLECTOMY      Family History  Problem Relation Age of Onset  . Heart disease Father   . Other Brother     open heart surgery  . Cancer Brother   . Other Brother     open heart surgery  . Other Brother     open heart surgery  . Hyperlipidemia Daughter     Social History   Social History  . Marital status: Widowed    Spouse name: N/A  . Number of children: N/A  . Years of education: N/A   Occupational History  . Not on file.   Social History Main Topics  . Smoking status: Never Smoker  . Smokeless tobacco: Never Used  . Alcohol use No  . Drug use: No  . Sexual activity: No   Other Topics Concern  . Not on file   Social History Narrative  . No narrative on file   The PMH, PSH, Social History, Family History, Medications, and allergies have been reviewed in [facility], and have been updated if relevant.   Review of Systems  Constitutional: Negative.   HENT: Negative.   Eyes: Negative.   Respiratory: Negative.   Cardiovascular: Negative.   Gastrointestinal: Negative.   Endocrine: Negative.   Genitourinary: Negative.   Musculoskeletal: Negative.   Skin: Negative.   Allergic/Immunologic: Negative.   Neurological: Negative.   Hematological: Negative.   Psychiatric/Behavioral: Negative.   All other systems reviewed and are negative.      Objective:    BP 124/68   Pulse 65   Temp 97.6 F (36.4 C) (Oral)   Ht 5\' 3"  (1.6 m)   Wt 134 lb 12 oz (61.1 kg)   SpO2 98%   BMI 23.87 kg/m    Physical Exam   General:  Well-developed,well-nourished,in no acute distress; alert,appropriate and cooperative throughout examination Head:  normocephalic and atraumatic.   Eyes:  vision grossly intact, PERRL Ears:  R ear normal and L ear normal externally, TMs clear bilaterally Nose:  no external deformity.   Mouth:  good dentition.   Neck:  No deformities, masses, or  tenderness noted. Breasts:  No mass, nodules, thickening, tenderness, bulging, retraction, inflamation, nipple discharge or skin changes noted.   Lungs:  Normal respiratory effort, chest expands symmetrically. Lungs are clear to auscultation, no crackles or wheezes. Heart:  Normal rate and regular rhythm. S1 and S2 normal without gallop, murmur, click, rub or other extra sounds. Abdomen:  Bowel sounds positive,abdomen soft and non-tender without masses, organomegaly or hernias noted. Msk:  No deformity or scoliosis noted of thoracic or lumbar spine.   Extremities:  No clubbing, cyanosis, edema, or deformity noted with normal full range of motion of all joints.   Neurologic:  alert & oriented X3 and gait normal.   Skin:  Intact without suspicious lesions or rashes Cervical Nodes:  No lymphadenopathy noted Axillary Nodes:  No palpable lymphadenopathy Psych:  Cognition and judgment appear intact. Alert and cooperative with normal attention span and concentration. No apparent delusions, illusions, hallucinations       Assessment & Plan:   Essential hypertension  Well woman exam  Other hyperlipidemia  Diabetes mellitus due to underlying condition with hyperosmolarity without coma, without long-term current use of insulin (HCC)  Depression, unspecified depression type No Follow-up on file.

## 2016-11-05 NOTE — Assessment & Plan Note (Signed)
Reviewed preventive care protocols, scheduled due services, and updated immunizations Discussed nutrition, exercise, diet, and healthy lifestyle.  Prevnar 13 given today. 

## 2016-11-05 NOTE — Assessment & Plan Note (Signed)
Followed by cardiology. Continue current rxs. 

## 2016-11-05 NOTE — Assessment & Plan Note (Signed)
Well controlled on current dose of remeron. No changes made today.

## 2016-11-05 NOTE — Progress Notes (Signed)
Pre visit review using our clinic review tool, if applicable. No additional management support is needed unless otherwise documented below in the visit note. 

## 2016-11-05 NOTE — Patient Instructions (Signed)
Great to see you.  Let's cut back on carbohydrates like breads and pastas and follow up with me in 3 months.   Vit B12 was a little low- please start taking over the counter vitamin B12 1000 mcg daily.

## 2016-11-19 ENCOUNTER — Encounter: Payer: Self-pay | Admitting: Cardiovascular Disease

## 2016-11-19 ENCOUNTER — Ambulatory Visit (INDEPENDENT_AMBULATORY_CARE_PROVIDER_SITE_OTHER): Payer: Medicare HMO | Admitting: Cardiovascular Disease

## 2016-11-19 VITALS — BP 132/60 | HR 64 | Ht 63.0 in | Wt 133.5 lb

## 2016-11-19 DIAGNOSIS — I2510 Atherosclerotic heart disease of native coronary artery without angina pectoris: Secondary | ICD-10-CM | POA: Diagnosis not present

## 2016-11-19 DIAGNOSIS — I2583 Coronary atherosclerosis due to lipid rich plaque: Secondary | ICD-10-CM

## 2016-11-19 DIAGNOSIS — E784 Other hyperlipidemia: Secondary | ICD-10-CM | POA: Diagnosis not present

## 2016-11-19 DIAGNOSIS — I10 Essential (primary) hypertension: Secondary | ICD-10-CM | POA: Diagnosis not present

## 2016-11-19 DIAGNOSIS — Z951 Presence of aortocoronary bypass graft: Secondary | ICD-10-CM | POA: Diagnosis not present

## 2016-11-19 DIAGNOSIS — I701 Atherosclerosis of renal artery: Secondary | ICD-10-CM | POA: Diagnosis not present

## 2016-11-19 DIAGNOSIS — E7849 Other hyperlipidemia: Secondary | ICD-10-CM

## 2016-11-19 MED ORDER — EZETIMIBE 10 MG PO TABS: 10.0000 mg | ORAL_TABLET | Freq: Every day | ORAL | 3 refills | Status: DC

## 2016-11-19 MED ORDER — ROSUVASTATIN CALCIUM 40 MG PO TABS: 40.0000 mg | ORAL_TABLET | Freq: Every day | ORAL | 4 refills | Status: DC

## 2016-11-19 NOTE — Progress Notes (Signed)
Cardiology Office Note  Date:  11/19/2016   ID:  Tracey [name], DOB Aug 11, 1936, MRN [id number]  PCP:  Arnette Norris, MD   Chief Complaint  Patient presents with  . other    6 month follow up. Meds reviewed by the pt. verbally. Pt. c/o shortness of breath with over exertion.     HPI:  [name] is a very pleasant 81 year-old woman with a history of coronary artery disease, bypass surgery in 1995, diabetes with hemoglobin A1c 7.0, stent to her graft to the OM several years ago with chest pain January 2013 and repeat stenting to the graft to the OM with a Xience 3.5 x 18 mm stent (patient and husband report that she required delivery of a shock for resuscitation during the procedure), also with left renal PCI in 1998, redo PCI March 2010 followed by [name], who presents for routine followup of her coronary artery disease  She lost her husband in 2016  In follow-up today she reports she is getting over several illnesses Shingles, PNA and the flu Denies any significant chest pain or shortness of breath on exertion Struggling with her diabetes numbers, recently changed her diet  Lab work reviewed with her in detail HBA1C 7.9, up from 7.0 B12 278 Total cholesterol 150, LDL 80  Denies having any spells of weakness and fainting as she was having in the past Previous 30 day monitor ordered showing no significant arrhythmia, normal sinus rhythm She was having episodes of diarrhea  Episodes of tachycardia have improved Denies any leg swelling, no PND or orthopnea, no chest pain on exertion  EKG on today's visit shows normal sinus rhythm with rate 64 bpm no significant ST-T wave changes, low voltage  Other past medical history reviewed Prior lab work reviewed with her showing total cholesterol 170s, hemoglobin A1c greater than 7  She is very active and takes care of a large family.   right knee replacement in July 2014   Details from her bypass surgery showed LIMA to the LAD,  vein graft to the PDA and vein graft to the OM Details from the cardiac catheterization January 2013 show proximal LAD occlusion, proximal left circumflex occlusion, proximal RCA occlusion, graft to the OM with 90% disease proximally. DES stent was placed.  Echocardiogram dated January 2012 shows normal systolic function, mild MR and TR   PMH:   has a past medical history of Anxiety; Atrial fibrillation ([place]); Coronary artery disease; Diabetes mellitus; GERD (gastroesophageal reflux disease); Heart murmur; Hyperlipidemia; Hypertension; and Peripheral vascular disease ([place]).  PSH:    Past Surgical History:  Procedure Laterality Date  . APPENDECTOMY    . CARDIAC CATHETERIZATION    . CATARACT EXTRACTION W/PHACO Left 08/15/2015   Procedure: CATARACT EXTRACTION PHACO AND INTRAOCULAR LENS PLACEMENT (IOC);  Surgeon: Estill Cotta, MD;  Location: [facility] ORS;  Service: Ophthalmology;  Laterality: Left;  [place] 01:28 AP% 26.4 CDE 35.73 fluid pack # [id number]  . COLONOSCOPY WITH PROPOFOL N/A 01/16/2016   Procedure: COLONOSCOPY WITH PROPOFOL;  Surgeon: Manya Silvas, MD;  Location: [facility] ENDOSCOPY;  Service: Endoscopy;  Laterality: N/A;  . CORONARY ANGIOPLASTY WITH STENT PLACEMENT    . CORONARY ARTERY BYPASS GRAFT     with LIMA  to the LAD, SVG to OM1, SVG to PDA  . ESOPHAGOGASTRODUODENOSCOPY (EGD) WITH PROPOFOL N/A 01/16/2016   Procedure: ESOPHAGOGASTRODUODENOSCOPY (EGD) WITH PROPOFOL;  Surgeon: Manya Silvas, MD;  Location: [facility] ENDOSCOPY;  Service: Endoscopy;  Laterality: N/A;  . RENAL ARTERY  STENT     left, By Dr Hulda Humphrey  . REPLACEMENT TOTAL KNEE  03/25/2013   right  . TONSILLECTOMY      Current Outpatient Prescriptions  Medication Sig Dispense Refill  . aspirin 81 MG tablet Take 81 mg by mouth daily.     . clopidogrel (PLAVIX) 75 MG tablet Take 1 tablet (75 mg total) by mouth daily. 90 tablet 3  . glucose blood (ONE TOUCH TEST STRIPS) test strip USE AS DIRECTED TO CHECK BLOOD SUGAR ONCE  DAILY E08.00 100 each 5  . Lancets (ONETOUCH ULTRASOFT) lancets USE AS INSTRUCTED TO TEST BLOOD SUGAR ONCE DAILY E08.00 100 each 12  . lisinopril (PRINIVIL,ZESTRIL) 20 MG tablet Take 1 tablet (20 mg total) by mouth daily. 90 tablet 3  . metFORMIN (GLUCOPHAGE) 1000 MG tablet TAKE 1 TABLET BY MOUTH TWICE DAILY    . metoprolol succinate (TOPROL-XL) 25 MG 24 hr tablet Take 1 tablet (25 mg total) by mouth daily. (Patient taking differently: Take 12.5 mg by mouth daily. ) 90 tablet 3  . mirtazapine (REMERON) 30 MG tablet Take 1 tablet (30 mg total) by mouth at bedtime. (Patient taking differently: Take 15 mg by mouth at bedtime. ) 30 tablet 3  . Multiple Vitamins-Minerals (MULTIVITAMIN WITH MINERALS) tablet Take 1 tablet by mouth daily.    . pantoprazole (PROTONIX) 40 MG tablet Take 40 mg by mouth daily.    . rosuvastatin (CRESTOR) 40 MG tablet Take 1 tablet (40 mg total) by mouth daily. 90 tablet 4  . nitroGLYCERIN (NITROSTAT) 0.4 MG SL tablet Place 1 tablet (0.4 mg total) under the tongue every 5 (five) minutes as needed for chest pain. 25 tablet 6   No current facility-administered medications for this visit.      Allergies:   Ciprofloxacin; Codeine; Erythromycin; and Tramadol   Social History:  The patient  reports that she has never smoked. She has never used smokeless tobacco. She reports that she does not drink alcohol or use drugs.   Family History:   family history includes Cancer in her brother; Heart disease in her father; Hyperlipidemia in her daughter; Other in her brother, brother, and brother.    Review of Systems: Review of Systems  Constitutional: Negative.   Respiratory: Negative.   Cardiovascular: Negative.   Gastrointestinal: Negative.   Musculoskeletal: Positive for back pain.       Legs burning at nighttime  Neurological: Negative.   Psychiatric/Behavioral: Negative.   All other systems reviewed and are negative.    PHYSICAL EXAM: VS:  BP 132/60 (BP Location: Left  Arm, Patient Position: Sitting, Cuff Size: Normal)   Pulse 64   Ht 5\' 3"  (1.6 m)   Wt 133 lb 8 oz (60.6 kg)   BMI 23.65 kg/m  , BMI Body mass index is 23.65 kg/m. GEN: Well nourished, well developed, in no acute distress  HEENT: normal  Neck: no JVD, carotid bruits, or masses Cardiac: RRR; no murmurs, rubs, or gallops,no edema  Respiratory:  clear to auscultation bilaterally, normal work of breathing GI: soft, nontender, nondistended, + BS MS: no deformity or atrophy  Skin: warm and dry, no rash Neuro:  Strength and sensation are intact Psych: euthymic mood, full affect    Recent Labs: 02/06/2016: Magnesium 1.6; TSH 1.86 10/26/2016: ALT 10; BUN 33; Creatinine, Ser 0.88; Hemoglobin 10.8; Platelets 199.0; Potassium 4.5; Sodium 140    Lipid Panel Lab Results  Component Value Date   CHOL 148 10/26/2016   HDL 40.10 10/26/2016  LDLCALC 82 10/26/2016   TRIG 129.0 10/26/2016      Wt Readings from Last 3 Encounters:  11/19/16 133 lb 8 oz (60.6 kg)  11/05/16 134 lb 12 oz (61.1 kg)  10/26/16 133 lb (60.3 kg)       ASSESSMENT AND PLAN:  Essential hypertension - Plan: EKG 12-Lead Blood pressure is well controlled on today's visit. No changes made to the medications.  Coronary artery disease due to lipid rich plaque - Plan: EKG 12-Lead Currently with no symptoms of angina. No further workup at this time. Continue current medication regimen.  Other hyperlipidemia - Plan: EKG 12-Lead Recommended that she stay on Crestor, consider starting zetia 10 mg daily to achieve LDL less than 70  Renal artery stenosis (HCC) - Plan: EKG 12-Lead She's having some left back pain, wonders if it is her stands Likely more arthritic pain in her back If symptoms get worse and she is worried we could repeat renal artery ultrasound She is followed by [name]  S/P CABG x 3 - Plan: EKG 12-Lead Currently with no symptoms of angina. No further workup at this time. Continue current medication  regimen.  Neuropathy  recommended she talk with Dr. Deborra Medina  Lower extremity discomfort only at night, legs burning Perhaps she is a candidate for Neurontin at nighttime   Total encounter time more than 25 minutes  Greater than 50% was spent in counseling and coordination of care with the patient      F/U  6 months   Orders Placed This Encounter  Procedures  . EKG 12-Lead     Signed, Esmond Plants, M.D., Ph.D. 11/19/2016  [place], [place]

## 2016-11-19 NOTE — Patient Instructions (Addendum)
Medication Instructions:   Consider starting zetia once a day  Ask Dr. Deborra Medina about neurontin or gabapentin  Labwork:  No new labs needed  Testing/Procedures:  No further testing at this time   I recommend watching educational videos on topics of interest to you at:       [email or web address]  Enter code: [email or web address]    Follow-Up: It was a pleasure seeing you in the office today. Please call us if you have new issues that need to be addressed before your next appt.  [phone]  Your physician wants you to follow-up in: 6 months.  You will receive a reminder letter in the mail two months in advance. If you don't receive a letter, please call our office to schedule the follow-up appointment.  If you need a refill on your cardiac medications before your next appointment, please call your pharmacy.

## 2016-11-27 DIAGNOSIS — R300 Dysuria: Secondary | ICD-10-CM | POA: Diagnosis not present

## 2016-11-27 DIAGNOSIS — N3000 Acute cystitis without hematuria: Secondary | ICD-10-CM | POA: Diagnosis not present

## 2016-12-04 ENCOUNTER — Ambulatory Visit
Admission: RE | Admit: 2016-12-04 | Discharge: 2016-12-04 | Disposition: A | Discharge status: Home / Self Care | Payer: Medicare HMO | Source: Ambulatory Visit | Attending: Family Medicine | Admitting: Family Medicine

## 2016-12-04 DIAGNOSIS — Z1231 Encounter for screening mammogram for malignant neoplasm of breast: Secondary | ICD-10-CM | POA: Insufficient documentation

## 2016-12-17 ENCOUNTER — Other Ambulatory Visit: Payer: Self-pay | Admitting: *Deleted

## 2016-12-17 ENCOUNTER — Other Ambulatory Visit: Payer: Self-pay | Admitting: Family Medicine

## 2016-12-17 ENCOUNTER — Telehealth (INDEPENDENT_AMBULATORY_CARE_PROVIDER_SITE_OTHER): Payer: Medicare HMO | Admitting: Family Medicine

## 2016-12-17 DIAGNOSIS — R350 Frequency of micturition: Secondary | ICD-10-CM | POA: Diagnosis not present

## 2016-12-17 LAB — POC URINALSYSI DIPSTICK (AUTOMATED)
Bilirubin, UA: NEGATIVE
Glucose, UA: NEGATIVE
Ketones, UA: NEGATIVE
NITRITE UA: NEGATIVE
PH UA: 6 (ref 5.0–8.0)
Spec Grav, UA: 1.025 (ref 1.030–1.035)
UROBILINOGEN UA: NEGATIVE (ref ?–2.0)

## 2016-12-17 MED ORDER — SULFAMETHOXAZOLE-TRIMETHOPRIM 800-160 MG PO TABS: 1.0000 | ORAL_TABLET | Freq: Two times a day (BID) | ORAL | 0 refills | Status: DC

## 2016-12-17 NOTE — Telephone Encounter (Signed)
Spoken to patient's granddaughter and notified Dr Hulen Shouts comments here and also in result note.

## 2016-12-17 NOTE — Telephone Encounter (Signed)
Noted.  Urine cx was insignificant growth/inconclusive. Please let pt know that I have sent bactrim to her pharmacy and make sure we have sent a culture of the urine she left here.

## 2016-12-17 NOTE — Telephone Encounter (Signed)
Patient was seen on 11/27/16 at [facility] and was given Kelflex 500 mg capsule for 7 days. Patient finished over 2 weeks ago. Dx was Dysuria.  UA and urine culture was done.  Patient uses [facility] [address] in [place], [place]

## 2016-12-17 NOTE — Telephone Encounter (Signed)
Printed patient's visit on 11/27/2016 from care everywhere and placed in your inbox. I have highlighted the urine culture result that was available.

## 2016-12-17 NOTE — Telephone Encounter (Signed)
Can we get a copy of the culture results?  It would be helpful to know how to treat this UTI, especially since she has multiple abx allergies. Thank you.

## 2016-12-19 ENCOUNTER — Other Ambulatory Visit: Payer: Self-pay | Admitting: Family Medicine

## 2016-12-19 LAB — URINE CULTURE

## 2016-12-19 MED ORDER — CEPHALEXIN 500 MG PO CAPS: 500.0000 mg | ORAL_CAPSULE | Freq: Two times a day (BID) | ORAL | 0 refills | Status: DC

## 2017-01-02 ENCOUNTER — Encounter: Payer: Self-pay | Admitting: *Deleted

## 2017-01-03 ENCOUNTER — Encounter: Payer: Medicare HMO | Admitting: Obstetrics and Gynecology

## 2017-01-07 ENCOUNTER — Ambulatory Visit: Payer: Medicare HMO | Admitting: Obstetrics and Gynecology

## 2017-01-07 ENCOUNTER — Encounter: Payer: Self-pay | Admitting: Obstetrics and Gynecology

## 2017-01-07 VITALS — BP 123/61 | HR 68 | Ht 63.0 in | Wt 134.5 lb

## 2017-01-07 DIAGNOSIS — Z780 Asymptomatic menopausal state: Secondary | ICD-10-CM

## 2017-01-07 DIAGNOSIS — E559 Vitamin D deficiency, unspecified: Secondary | ICD-10-CM | POA: Diagnosis not present

## 2017-01-07 DIAGNOSIS — Z87440 Personal history of urinary (tract) infections: Secondary | ICD-10-CM

## 2017-01-07 LAB — POCT URINALYSIS DIPSTICK
BILIRUBIN UA: NEGATIVE
Blood, UA: NEGATIVE
GLUCOSE UA: NEGATIVE
KETONES UA: NEGATIVE
NITRITE UA: NEGATIVE
PH UA: 6.5 (ref 5.0–8.0)
Spec Grav, UA: 1.015 (ref 1.010–1.025)
Urobilinogen, UA: 0.2 E.U./dL

## 2017-01-07 NOTE — Progress Notes (Unsigned)
Subjective:   Tracey [name] is a 81 y.o. G22P4 Caucasian female here for a routine well-woman exam.  No LMP recorded. Patient is postmenopausal.    Current complaints: three UTIs in last few months, just finished medication 2 weeks ago. PCP: Marjory Lies       doesn't desire labs  Social History: Sexual: heterosexual Marital Status: widowed Living situation: alone Occupation: retired Tobacco/alcohol: no tobacco use Illicit drugs: no history of illicit drug use  The following portions of the patient's history were reviewed and updated as appropriate: allergies, current medications, past family history, past medical history, past social history, past surgical history and problem list.  Past Medical History Past Medical History:  Diagnosis Date  . Anxiety   . Atrial fibrillation ([place])    history  . Coronary artery disease   . Diabetes mellitus    non insulin dependent  . GERD (gastroesophageal reflux disease)   . Heart murmur   . Hyperlipidemia   . Hypertension   . Peripheral vascular disease ([place])   . UTI (urinary tract infection)     Past Surgical History Past Surgical History:  Procedure Laterality Date  . APPENDECTOMY    . BREAST BIOPSY Left    20 years ago  . BREAST BIOPSY Right 2008  . BREAST EXCISIONAL BIOPSY     rt 2008 lt "20 years ago"  . CARDIAC CATHETERIZATION    . CATARACT EXTRACTION W/PHACO Left 08/15/2015   Procedure: CATARACT EXTRACTION PHACO AND INTRAOCULAR LENS PLACEMENT (IOC);  Surgeon: Estill Cotta, MD;  Location: [facility] ORS;  Service: Ophthalmology;  Laterality: Left;  [place] 01:28 AP% 26.4 CDE 35.73 fluid pack # [id number]  . COLONOSCOPY WITH PROPOFOL N/A 01/16/2016   Procedure: COLONOSCOPY WITH PROPOFOL;  Surgeon: Manya Silvas, MD;  Location: [facility] ENDOSCOPY;  Service: Endoscopy;  Laterality: N/A;  . CORONARY ANGIOPLASTY WITH STENT PLACEMENT    . CORONARY ARTERY BYPASS GRAFT     with LIMA  to the LAD, SVG to OM1, SVG to PDA  .  ESOPHAGOGASTRODUODENOSCOPY (EGD) WITH PROPOFOL N/A 01/16/2016   Procedure: ESOPHAGOGASTRODUODENOSCOPY (EGD) WITH PROPOFOL;  Surgeon: Manya Silvas, MD;  Location: [facility] ENDOSCOPY;  Service: Endoscopy;  Laterality: N/A;  . RENAL ARTERY STENT     left, By Dr Hulda Humphrey  . REPLACEMENT TOTAL KNEE  03/25/2013   right  . TONSILLECTOMY      Gynecologic History G4P4  No LMP recorded. Patient is postmenopausal. Contraception: abstinence Last Pap: 2012. Results were: normal Last mammogram: 11/2016. Results were: normal   Obstetric History OB History  Gravida Para Term Preterm AB Living  4 4          SAB TAB Ectopic Multiple Live Births               # Outcome Date GA Lbr Len/2nd Weight Sex Delivery Anes PTL Lv  4 Para 1969    M Vag-Spont     3 Para 1960    F Vag-Spont     2 Para 1957    F Vag-Spont     1 Para 1956    F Vag-Spont         Current Medications Current Outpatient Prescriptions on File Prior to Visit  Medication Sig Dispense Refill  . aspirin 81 MG tablet Take 81 mg by mouth daily.     . clopidogrel (PLAVIX) 75 MG tablet Take 1 tablet (75 mg total) by mouth daily. 90 tablet 3  . glucose blood (ONE TOUCH TEST STRIPS) test strip  USE AS DIRECTED TO CHECK BLOOD SUGAR ONCE DAILY E08.00 100 each 5  . Lancets (ONETOUCH ULTRASOFT) lancets USE AS INSTRUCTED TO TEST BLOOD SUGAR ONCE DAILY E08.00 100 each 12  . lisinopril (PRINIVIL,ZESTRIL) 20 MG tablet Take 1 tablet (20 mg total) by mouth daily. 90 tablet 3  . metFORMIN (GLUCOPHAGE) 1000 MG tablet TAKE 1 TABLET BY MOUTH TWICE DAILY    . metoprolol succinate (TOPROL-XL) 25 MG 24 hr tablet Take 1 tablet (25 mg total) by mouth daily. (Patient taking differently: Take 12.5 mg by mouth daily. ) 90 tablet 3  . mirtazapine (REMERON) 30 MG tablet Take 1 tablet (30 mg total) by mouth at bedtime. (Patient taking differently: Take 15 mg by mouth at bedtime. ) 30 tablet 3  . Multiple Vitamins-Minerals (MULTIVITAMIN WITH MINERALS) tablet Take 1 tablet  by mouth daily.    . pantoprazole (PROTONIX) 40 MG tablet Take 40 mg by mouth daily.    . rosuvastatin (CRESTOR) 40 MG tablet Take 1 tablet (40 mg total) by mouth daily. 90 tablet 4  . cephALEXin (KEFLEX) 500 MG capsule Take 1 capsule (500 mg total) by mouth 2 (two) times daily. (Patient not taking: Reported on 01/07/2017) 14 capsule 0  . ezetimibe (ZETIA) 10 MG tablet Take 1 tablet (10 mg total) by mouth daily. (Patient not taking: Reported on 01/07/2017) 90 tablet 3  . nitroGLYCERIN (NITROSTAT) 0.4 MG SL tablet Place 1 tablet (0.4 mg total) under the tongue every 5 (five) minutes as needed for chest pain. 25 tablet 6   No current facility-administered medications on file prior to visit.     Review of Systems Patient denies any headaches, blurred vision, shortness of breath, chest pain, abdominal pain, problems with bowel movements, urination, or intercourse.  Objective:  BP 123/61   Pulse 68   Ht 5\' 3"  (1.6 m)   Wt 134 lb 8 oz (61 kg)   BMI 23.83 kg/m  Physical Exam  General:  Well developed, well nourished, no acute distress. She is alert and oriented x3. Skin:  Warm and dry Neck:  Midline trachea, no thyromegaly or nodules Cardiovascular: Regular rate and rhythm, no murmur heard Lungs:  Effort normal, all lung fields clear to auscultation bilaterally Breasts:  No dominant palpable mass, retraction, or nipple discharge Abdomen:  Soft, non tender, no hepatosplenomegaly or masses Pelvic:  External genitalia is normal in appearance.  The vagina is normal atrophic in appearance. The cervix is bulbous, no CMT.  Thin prep pap is not done. Uterus is felt to be normal size, shape, and contour.  No adnexal masses or tenderness noted. Extremities:  No swelling or varicosities noted Psych:  She has a normal mood and affect  Assessment:   Healthy well-woman exam s/p menopausal state h/O recurrent UTIs  Plan:   F/U *** for ***, or sooner if needed BDS and vit D labs obtained, rechecked  urine while here in office. Melody Rockney Ghee, CNM

## 2017-01-08 ENCOUNTER — Telehealth: Payer: Self-pay

## 2017-01-08 LAB — VITAMIN D 25 HYDROXY (VIT D DEFICIENCY, FRACTURES): VIT D 25 HYDROXY: 28.3 ng/mL — AB (ref 30.0–100.0)

## 2017-01-08 NOTE — Telephone Encounter (Signed)
I will forward to Dr Diona Browner to approve, also.

## 2017-01-08 NOTE — Telephone Encounter (Signed)
Pt has asked to transfer to Dr Diona Browner from Dr Deborra Medina due to Dr Hulen Shouts pending move to a new office.

## 2017-01-08 NOTE — Telephone Encounter (Signed)
[place] with me.  Very sweet lady.

## 2017-01-08 NOTE — Telephone Encounter (Signed)
Agreeable to transfer.

## 2017-01-09 NOTE — Telephone Encounter (Signed)
I spoke to patient and let her know the transfer was approved.

## 2017-01-10 ENCOUNTER — Other Ambulatory Visit: Payer: Self-pay | Admitting: Family Medicine

## 2017-01-15 ENCOUNTER — Ambulatory Visit: Admission: RE | Admit: 2017-01-15 | Payer: Medicare HMO | Source: Ambulatory Visit

## 2017-01-17 ENCOUNTER — Other Ambulatory Visit: Payer: Self-pay

## 2017-01-17 MED ORDER — PANTOPRAZOLE SODIUM 40 MG PO TBEC: 40.0000 mg | DELAYED_RELEASE_TABLET | Freq: Every day | ORAL | 2 refills | Status: DC

## 2017-01-18 ENCOUNTER — Ambulatory Visit (INDEPENDENT_AMBULATORY_CARE_PROVIDER_SITE_OTHER): Payer: Medicare HMO | Admitting: Family Medicine

## 2017-01-18 ENCOUNTER — Encounter: Payer: Self-pay | Admitting: Family Medicine

## 2017-01-18 VITALS — BP 120/64 | HR 86 | Temp 99.1°F | Ht 63.0 in | Wt 134.0 lb

## 2017-01-18 DIAGNOSIS — J111 Influenza due to unidentified influenza virus with other respiratory manifestations: Secondary | ICD-10-CM | POA: Diagnosis not present

## 2017-01-18 MED ORDER — BENZONATATE 200 MG PO CAPS: 200.0000 mg | ORAL_CAPSULE | Freq: Three times a day (TID) | ORAL | 1 refills | Status: DC | PRN

## 2017-01-18 MED ORDER — OSELTAMIVIR PHOSPHATE 75 MG PO CAPS: 75.0000 mg | ORAL_CAPSULE | Freq: Two times a day (BID) | ORAL | 0 refills | Status: DC

## 2017-01-18 NOTE — Progress Notes (Signed)
Pre visit review using our clinic review tool, if applicable. No additional management support is needed unless otherwise documented below in the visit note. 

## 2017-01-18 NOTE — Progress Notes (Signed)
Subjective:    Patient ID: Tracey [name], female    DOB: May 06, 1936, 81 y.o.   MRN: [id number]  HPI 81 yo pt of Dr Diona Browner with hx of CAD and DM here with respiratory symptoms incl cough /cong   Pulse ox is 97% on RA  Temp: 99.1 F (37.3 C)  Has had elevated temp at home with body aches (low grade)- with body ache   No exp to the flu or bronchitis or pneumonia that she knows of  Was in waiting area of a hospital this week however  Started 1 1/2 days ago  Started with head symptoms --congestion and runny nose (nasal d/c is clear) Then chills and aching today  Cough is dry (not productive)  Some pnd   No headache  Ears feel ok  A little throat pain when she swallows  No tick bites   Tried some mucinex DM  Drinking lots of fluids   Patient Active Problem List   Diagnosis Date Noted  . Influenza 01/18/2017  . Well woman exam 11/05/2016  . Vitamin B12 deficiency 11/05/2016  . Herpes zoster 10/02/2016  . Depression 04/19/2016  . Fatigue 02/06/2016  . Angina pectoris ([place]) 08/26/2013  . Hypertension 01/22/2012  . Coronary heart disease 01/22/2012  . S/P CABG x 3 01/22/2012  . Hyperlipidemia 01/22/2012  . Diabetes mellitus ([place]) 01/22/2012  . Renal artery stenosis ([place]) 01/22/2012   Past Medical History:  Diagnosis Date  . Anxiety   . Atrial fibrillation ([place])    history  . Coronary artery disease   . Diabetes mellitus    non insulin dependent  . GERD (gastroesophageal reflux disease)   . Heart murmur   . Hyperlipidemia   . Hypertension   . Peripheral vascular disease ([place])   . UTI (urinary tract infection)    Past Surgical History:  Procedure Laterality Date  . APPENDECTOMY    . BREAST BIOPSY Left    20 years ago  . BREAST BIOPSY Right 2008  . BREAST EXCISIONAL BIOPSY     rt 2008 lt "20 years ago"  . CARDIAC CATHETERIZATION    . CATARACT EXTRACTION W/PHACO Left 08/15/2015   Procedure: CATARACT EXTRACTION PHACO AND INTRAOCULAR LENS PLACEMENT (IOC);   Surgeon: Estill Cotta, MD;  Location: [facility] ORS;  Service: Ophthalmology;  Laterality: Left;  [place] 01:28 AP% 26.4 CDE 35.73 fluid pack # [id number]  . COLONOSCOPY WITH PROPOFOL N/A 01/16/2016   Procedure: COLONOSCOPY WITH PROPOFOL;  Surgeon: Manya Silvas, MD;  Location: [facility] ENDOSCOPY;  Service: Endoscopy;  Laterality: N/A;  . CORONARY ANGIOPLASTY WITH STENT PLACEMENT    . CORONARY ARTERY BYPASS GRAFT     with LIMA  to the LAD, SVG to OM1, SVG to PDA  . ESOPHAGOGASTRODUODENOSCOPY (EGD) WITH PROPOFOL N/A 01/16/2016   Procedure: ESOPHAGOGASTRODUODENOSCOPY (EGD) WITH PROPOFOL;  Surgeon: Manya Silvas, MD;  Location: [facility] ENDOSCOPY;  Service: Endoscopy;  Laterality: N/A;  . RENAL ARTERY STENT     left, By Dr Hulda Humphrey  . REPLACEMENT TOTAL KNEE  03/25/2013   right  . TONSILLECTOMY     Social History  Substance Use Topics  . Smoking status: Never Smoker  . Smokeless tobacco: Never Used  . Alcohol use No   Family History  Problem Relation Age of Onset  . Heart disease Father   . Other Brother     open heart surgery  . Cancer Brother   . Other Brother     open heart surgery  . Other  Brother     open heart surgery  . Hyperlipidemia Daughter   . Breast cancer Neg Hx    Allergies  Allergen Reactions  . Ciprofloxacin Other (See Comments)  . Codeine     Nausea   . Erythromycin Nausea And Vomiting  . Tramadol    Current Outpatient Prescriptions on File Prior to Visit  Medication Sig Dispense Refill  . aspirin 81 MG tablet Take 81 mg by mouth daily.     . clopidogrel (PLAVIX) 75 MG tablet Take 1 tablet (75 mg total) by mouth daily. 90 tablet 3  . ezetimibe (ZETIA) 10 MG tablet Take 1 tablet (10 mg total) by mouth daily. 90 tablet 3  . glucose blood (ONE TOUCH TEST STRIPS) test strip USE AS DIRECTED TO CHECK BLOOD SUGAR ONCE DAILY E08.00 100 each 5  . Lancets (ONETOUCH ULTRASOFT) lancets USE AS INSTRUCTED TO TEST BLOOD SUGAR ONCE DAILY E08.00 100 each 12  . lisinopril  (PRINIVIL,ZESTRIL) 20 MG tablet Take 1 tablet (20 mg total) by mouth daily. 90 tablet 3  . metFORMIN (GLUCOPHAGE) 1000 MG tablet TAKE 1 TABLET BY MOUTH TWICE DAILY    . metoprolol succinate (TOPROL-XL) 25 MG 24 hr tablet Take 1 tablet (25 mg total) by mouth daily. (Patient taking differently: Take 12.5 mg by mouth daily. ) 90 tablet 3  . mirtazapine (REMERON) 30 MG tablet TAKE 1 TABLET BY MOUTH AT BEDTIME 30 tablet 3  . Multiple Vitamins-Minerals (MULTIVITAMIN WITH MINERALS) tablet Take 1 tablet by mouth daily.    . pantoprazole (PROTONIX) 40 MG tablet Take 1 tablet (40 mg total) by mouth daily. 90 tablet 2  . rosuvastatin (CRESTOR) 40 MG tablet Take 1 tablet (40 mg total) by mouth daily. 90 tablet 4  . nitroGLYCERIN (NITROSTAT) 0.4 MG SL tablet Place 1 tablet (0.4 mg total) under the tongue every 5 (five) minutes as needed for chest pain. 25 tablet 6   No current facility-administered medications on file prior to visit.     Review of Systems  Constitutional: Positive for appetite change, chills, fatigue and fever.  HENT: Positive for congestion, postnasal drip, rhinorrhea, sinus pressure, sneezing and sore throat. Negative for ear pain.   Eyes: Negative for pain and discharge.  Respiratory: Positive for cough. Negative for shortness of breath, wheezing and stridor.   Cardiovascular: Negative for chest pain.  Gastrointestinal: Negative for diarrhea, nausea and vomiting.  Genitourinary: Negative for frequency, hematuria and urgency.  Musculoskeletal: Negative for arthralgias and myalgias.  Skin: Negative for rash.  Neurological: Positive for headaches. Negative for dizziness, weakness and light-headedness.  Psychiatric/Behavioral: Negative for confusion and dysphoric mood.       Objective:   Physical Exam  Constitutional: She appears well-developed and well-nourished. No distress.  Well but fatigued appearing elderly female   HENT:  Head: Normocephalic and atraumatic.  Right Ear:  External ear normal.  Left Ear: External ear normal.  Mouth/Throat: Oropharynx is clear and moist.  Nares are injected and congested  No sinus tenderness Clear rhinorrhea and post nasal drip   Eyes: Conjunctivae and EOM are normal. Pupils are equal, round, and reactive to light. Right eye exhibits no discharge. Left eye exhibits no discharge.  Neck: Normal range of motion. Neck supple.  Cardiovascular: Normal rate and normal heart sounds.   Pulmonary/Chest: Effort normal and breath sounds normal. No respiratory distress. She has no wheezes. She has no rales. She exhibits no tenderness.  Harsh bs w/o rales or rhonchi  Good air exch No wheeze  or prolonged exp phase Cough is dry   Lymphadenopathy:    She has no cervical adenopathy.  Neurological: She is alert.  Skin: Skin is warm and dry. No rash noted. No pallor.  Psychiatric: She has a normal mood and affect.          Assessment & Plan:   Problem List Items Addressed This Visit      Respiratory   Influenza    Pt meets clinical criteria for influenza (may be B strain based on time of season) tamiflu px  Disc symptomatic care - see instructions on AVS  mucinex DM, tessalon, fluids/rest  Watch carefully for wheezing/inc temp or signs of pneumonia  Acetaminophen for temp/aches- guidelines ref  Update if not starting to improve in a week or if worsening        Relevant Medications   oseltamivir (TAMIFLU) 75 MG capsule

## 2017-01-18 NOTE — Patient Instructions (Addendum)
Drink lots of fluids  Extra rest if possible  Take tylenol (acetaminophen)- 2 pills every 4 hours as needed for fever and aches  Avoid the public until you are better Take tamiflu as directed   Watch for higher fever/ worse cough or wheezing and keep [place] posted   Continue the mucinex dm for congestion and cough  Also the tessalon for cough

## 2017-01-19 NOTE — Assessment & Plan Note (Signed)
Pt meets clinical criteria for influenza (may be B strain based on time of season) tamiflu px  Disc symptomatic care - see instructions on AVS  mucinex DM, tessalon, fluids/rest  Watch carefully for wheezing/inc temp or signs of pneumonia  Acetaminophen for temp/aches- guidelines ref  Update if not starting to improve in a week or if worsening

## 2017-01-21 ENCOUNTER — Encounter: Payer: Self-pay | Admitting: Family Medicine

## 2017-01-21 ENCOUNTER — Telehealth: Payer: Self-pay | Admitting: *Deleted

## 2017-01-21 ENCOUNTER — Ambulatory Visit (INDEPENDENT_AMBULATORY_CARE_PROVIDER_SITE_OTHER)
Admission: RE | Admit: 2017-01-21 | Discharge: 2017-01-21 | Disposition: A | Discharge status: Home / Self Care | Payer: Medicare HMO | Source: Ambulatory Visit | Attending: Family Medicine | Admitting: Family Medicine

## 2017-01-21 ENCOUNTER — Ambulatory Visit (INDEPENDENT_AMBULATORY_CARE_PROVIDER_SITE_OTHER): Payer: Medicare HMO | Admitting: Family Medicine

## 2017-01-21 VITALS — BP 140/70 | HR 101 | Temp 98.5°F | Ht 63.0 in | Wt 134.0 lb

## 2017-01-21 DIAGNOSIS — J111 Influenza due to unidentified influenza virus with other respiratory manifestations: Secondary | ICD-10-CM

## 2017-01-21 DIAGNOSIS — R05 Cough: Secondary | ICD-10-CM | POA: Diagnosis not present

## 2017-01-21 DIAGNOSIS — J209 Acute bronchitis, unspecified: Secondary | ICD-10-CM | POA: Diagnosis not present

## 2017-01-21 MED ORDER — ALBUTEROL SULFATE HFA 108 (90 BASE) MCG/ACT IN AERS: 2.0000 | INHALATION_SPRAY | RESPIRATORY_TRACT | 1 refills | Status: DC | PRN

## 2017-01-21 MED ORDER — DOXYCYCLINE HYCLATE 100 MG PO TABS: 100.0000 mg | ORAL_TABLET | Freq: Two times a day (BID) | ORAL | 0 refills | Status: DC

## 2017-01-21 NOTE — Patient Instructions (Signed)
Look for mucinex DM max for cough Continue tessalon  Drink fluids and rest  Take doxycycline as directed for bronchitis Chest xray looks ok   Use the inhaler for wheezing if needed  Update if not starting to improve in a week or if worsening  (especially shortness of breath or worse wheezing)

## 2017-01-21 NOTE — Assessment & Plan Note (Signed)
With influenza - and worsening of cough/resp status as of yesterday  CXR-no infiltrate Reassuring exam without hypoxia Albuterol px (she has used the mdi before) Cover empirically with doxycycline in light of symptom severity and watch closely  mucinex DM max and tessalon for cough  Update if not starting to improve in a week or if worsening

## 2017-01-21 NOTE — Progress Notes (Signed)
Pre visit review using our clinic review tool, if applicable. No additional management support is needed unless otherwise documented below in the visit note. 

## 2017-01-21 NOTE — Telephone Encounter (Signed)
[name] notified of Dr. Marliss Coots comments and she will have pt come in for STAT xray

## 2017-01-21 NOTE — Telephone Encounter (Signed)
Let's get a CXR and ask it to be read stat please  Thanks

## 2017-01-21 NOTE — Telephone Encounter (Signed)
Leafy Ro gave me an update on pt, she checked in with pt this morning and pt still has a fever. When she checked it this morning it was 100.6. Pt said she feels like she is wheezing more and it sounds like she has a lot of "crackles". Her cough is turing very productive and when she coughs up phlegm it has dark streaks in it. [name] wanted to check with you to see what you recommend. She didn't know if you want to reeval pt (you have a 4:15pm) today, or send in an abx in just to be on the safe side, or should she come in for an xray, please advise

## 2017-01-21 NOTE — Telephone Encounter (Signed)
Order done Please check a pulse ox on her when she is here and let me know -thanks

## 2017-01-21 NOTE — Progress Notes (Signed)
Subjective:    Patient ID: Tracey [name], female    DOB: [birth year], 81 y.o.   MRN: [id number]  HPI Here for f/u of influenza  Yesterday - breathing got worse and wheezing got worse Phlegm - clear to yellow to green  Some green nasal drainage   Taking tamiflu   Fever is about the same 100.6 this am and 98.5 now   Taking tylenol and that helps fever   Pulse ox 98% RA  Tessalon for cough  She cannot tolerate any cough med with narcotic at all - they cause n/v   Reassuring cxr today   Dg Chest 2 View  Result Date: 01/21/2017 CLINICAL DATA:  Cough with influenza EXAM: CHEST  2 VIEW COMPARISON:  August 02, 2016 FINDINGS: There is a small granuloma in the right lower lobe. No edema or consolidation. Heart size and pulmonary vascularity are normal. Patient is status post coronary artery bypass grafting. There is aortic atherosclerosis. There is no evident adenopathy. There is degenerative change in the thoracic spine with localized kyphosis in the lower thoracic region, stable. IMPRESSION: No edema or consolidation.  Aortic atherosclerosis. Electronically Signed   By: Lowella Grip III M.D.   On: 01/21/2017 13:56    Patient Active Problem List   Diagnosis Date Noted  . Acute bronchitis 01/21/2017  . Influenza 01/18/2017  . Well woman exam 11/05/2016  . Vitamin B12 deficiency 11/05/2016  . Herpes zoster 10/02/2016  . Depression 04/19/2016  . Fatigue 02/06/2016  . Angina pectoris ([place]) 08/26/2013  . Hypertension 01/22/2012  . Coronary heart disease 01/22/2012  . S/P CABG x 3 01/22/2012  . Hyperlipidemia 01/22/2012  . Diabetes mellitus ([place]) 01/22/2012  . Renal artery stenosis ([employer]) 01/22/2012   Past Medical History:  Diagnosis Date  . Anxiety   . Atrial fibrillation ([place])    history  . Coronary artery disease   . Diabetes mellitus    non insulin dependent  . GERD (gastroesophageal reflux disease)   . Heart murmur   . Hyperlipidemia   . Hypertension   .  Peripheral vascular disease ([place])   . UTI (urinary tract infection)    Past Surgical History:  Procedure Laterality Date  . APPENDECTOMY    . BREAST BIOPSY Left    20 years ago  . BREAST BIOPSY Right 2008  . BREAST EXCISIONAL BIOPSY     rt 2008 lt "20 years ago"  . CARDIAC CATHETERIZATION    . CATARACT EXTRACTION W/PHACO Left 08/15/2015   Procedure: CATARACT EXTRACTION PHACO AND INTRAOCULAR LENS PLACEMENT (IOC);  Surgeon: Estill Cotta, MD;  Location: [facility] ORS;  Service: Ophthalmology;  Laterality: Left;  [place] 01:28 AP% 26.4 CDE 35.73 fluid pack # [id number]  . COLONOSCOPY WITH PROPOFOL N/A 01/16/2016   Procedure: COLONOSCOPY WITH PROPOFOL;  Surgeon: Manya Silvas, MD;  Location: [facility] ENDOSCOPY;  Service: Endoscopy;  Laterality: N/A;  . CORONARY ANGIOPLASTY WITH STENT PLACEMENT    . CORONARY ARTERY BYPASS GRAFT     with LIMA  to the LAD, SVG to OM1, SVG to PDA  . ESOPHAGOGASTRODUODENOSCOPY (EGD) WITH PROPOFOL N/A 01/16/2016   Procedure: ESOPHAGOGASTRODUODENOSCOPY (EGD) WITH PROPOFOL;  Surgeon: Manya Silvas, MD;  Location: [facility] ENDOSCOPY;  Service: Endoscopy;  Laterality: N/A;  . RENAL ARTERY STENT     left, By Dr Hulda Humphrey  . REPLACEMENT TOTAL KNEE  03/25/2013   right  . TONSILLECTOMY     Social History  Substance Use Topics  . Smoking status: Never Smoker  .  Smokeless tobacco: Never Used  . Alcohol use No   Family History  Problem Relation Age of Onset  . Heart disease Father   . Other Brother     open heart surgery  . Cancer Brother   . Other Brother     open heart surgery  . Other Brother     open heart surgery  . Hyperlipidemia Daughter   . Breast cancer Neg Hx    Allergies  Allergen Reactions  . Ciprofloxacin Other (See Comments)  . Codeine     Nausea   . Erythromycin Nausea And Vomiting  . Tramadol    Current Outpatient Prescriptions on File Prior to Visit  Medication Sig Dispense Refill  . aspirin 81 MG tablet Take 81 mg by mouth daily.     .  benzonatate (TESSALON) 200 MG capsule Take 1 capsule (200 mg total) by mouth 3 (three) times daily as needed. Swallow whole, do not bite the pill 30 capsule 1  . clopidogrel (PLAVIX) 75 MG tablet Take 1 tablet (75 mg total) by mouth daily. 90 tablet 3  . ezetimibe (ZETIA) 10 MG tablet Take 1 tablet (10 mg total) by mouth daily. 90 tablet 3  . glucose blood (ONE TOUCH TEST STRIPS) test strip USE AS DIRECTED TO CHECK BLOOD SUGAR ONCE DAILY E08.00 100 each 5  . Lancets (ONETOUCH ULTRASOFT) lancets USE AS INSTRUCTED TO TEST BLOOD SUGAR ONCE DAILY E08.00 100 each 12  . lisinopril (PRINIVIL,ZESTRIL) 20 MG tablet Take 1 tablet (20 mg total) by mouth daily. 90 tablet 3  . metFORMIN (GLUCOPHAGE) 1000 MG tablet TAKE 1 TABLET BY MOUTH TWICE DAILY    . metoprolol succinate (TOPROL-XL) 25 MG 24 hr tablet Take 1 tablet (25 mg total) by mouth daily. (Patient taking differently: Take 12.5 mg by mouth daily. ) 90 tablet 3  . mirtazapine (REMERON) 30 MG tablet TAKE 1 TABLET BY MOUTH AT BEDTIME 30 tablet 3  . Multiple Vitamins-Minerals (MULTIVITAMIN WITH MINERALS) tablet Take 1 tablet by mouth daily.    Marland Kitchen oseltamivir (TAMIFLU) 75 MG capsule Take 1 capsule (75 mg total) by mouth 2 (two) times daily. 10 capsule 0  . pantoprazole (PROTONIX) 40 MG tablet Take 1 tablet (40 mg total) by mouth daily. 90 tablet 2  . rosuvastatin (CRESTOR) 40 MG tablet Take 1 tablet (40 mg total) by mouth daily. 90 tablet 4  . nitroGLYCERIN (NITROSTAT) 0.4 MG SL tablet Place 1 tablet (0.4 mg total) under the tongue every 5 (five) minutes as needed for chest pain. 25 tablet 6   No current facility-administered medications on file prior to visit.     Review of Systems  Constitutional: Positive for appetite change, fatigue and fever.  HENT: Positive for congestion, postnasal drip, rhinorrhea, sinus pressure, sneezing and sore throat. Negative for ear pain.   Eyes: Negative for pain and discharge.  Respiratory: Positive for cough, shortness of  breath and wheezing. Negative for stridor.   Cardiovascular: Negative for chest pain.  Gastrointestinal: Negative for diarrhea, nausea and vomiting.  Genitourinary: Negative for frequency, hematuria and urgency.  Musculoskeletal: Negative for arthralgias and myalgias.  Skin: Negative for rash.  Neurological: Positive for headaches. Negative for dizziness, weakness and light-headedness.  Psychiatric/Behavioral: Negative for confusion and dysphoric mood.       Objective:   Physical Exam  Constitutional: She appears well-developed and well-nourished. No distress.  Fatigued appearing elderly female Not sob at rest  HENT:  Head: Normocephalic and atraumatic.  Mouth/Throat: Oropharynx is clear and  moist.  MMM  Clear rhinorrhea  Eyes: Conjunctivae and EOM are normal. Pupils are equal, round, and reactive to light. Right eye exhibits no discharge. Left eye exhibits no discharge.  Neck: Normal range of motion. Neck supple.  Cardiovascular: Regular rhythm.   Rate 101  Pulmonary/Chest: Effort normal. No respiratory distress. She has wheezes. She has no rales. She exhibits no tenderness.  Harsh bs with scattered rhonchi  (louder at bases) Fair air exch without prolonged exp phase  Cough is productive sounding Some upper airway sounds  No rales   Skin: Skin is warm and dry. No pallor.  Psychiatric: She has a normal mood and affect.          Assessment & Plan:   Problem List Items Addressed This Visit      Respiratory   Acute bronchitis    With influenza - and worsening of cough/resp status as of yesterday  CXR-no infiltrate Reassuring exam without hypoxia Albuterol px (she has used the mdi before) Cover empirically with doxycycline in light of symptom severity and watch closely  mucinex DM max and tessalon for cough  Update if not starting to improve in a week or if worsening

## 2017-01-25 ENCOUNTER — Ambulatory Visit (INDEPENDENT_AMBULATORY_CARE_PROVIDER_SITE_OTHER): Payer: Medicare HMO | Admitting: Primary Care

## 2017-01-25 ENCOUNTER — Encounter: Payer: Self-pay | Admitting: Primary Care

## 2017-01-25 VITALS — BP 126/74 | HR 89 | Temp 98.0°F | Ht 63.0 in | Wt 130.1 lb

## 2017-01-25 DIAGNOSIS — R05 Cough: Secondary | ICD-10-CM | POA: Diagnosis not present

## 2017-01-25 DIAGNOSIS — R059 Cough, unspecified: Secondary | ICD-10-CM

## 2017-01-25 NOTE — Patient Instructions (Signed)
Continue Doxycycline antibiotics.  Continue Mucinex as discussed.  Continue the albuterol inhaler as needed for wheezing and shortness of breath.  Ensure you are staying hydrated with water and rest.  It was a pleasure meeting you!

## 2017-01-25 NOTE — Progress Notes (Signed)
Subjective:    Patient ID: Tracey [name], female    DOB: 25-Jun-1936, 81 y.o.   MRN: [id number]  HPI   [name] is a 81 year old female who presents today for follow up of acute bronchitis.  She was last evaluated on 05/07 for complaints of shortness of breath and wheezing that had progressed since diagnosis of influenza and treatment with Tamiflu. She was determined to have developed acute bronchitis with influenza and was prescribed an empirical course of Doxycycline and Tessalon Pearls. She was encouraged to start Mucinex.   Since her last visit she's been compliant to her Doxycycline. She continues to experience a cough. She's feeling better today and is able to expel mucous from her chest which is yellow/green in color. She did run a low grade fever last night. Her energy is returning slightly and her shortness of breath has decreased. She is using her albuterol inhaler as needed.  Review of Systems  Constitutional: Positive for fever.  Respiratory: Positive for cough. Negative for shortness of breath and wheezing.   Cardiovascular: Negative for chest pain.  Neurological: Negative for weakness.       Past Medical History:  Diagnosis Date  . Anxiety   . Atrial fibrillation ([place])    history  . Coronary artery disease   . Diabetes mellitus    non insulin dependent  . GERD (gastroesophageal reflux disease)   . Heart murmur   . Hyperlipidemia   . Hypertension   . Peripheral vascular disease ([place])   . UTI (urinary tract infection)      Social History   Social History  . Marital status: Widowed    Spouse name: N/A  . Number of children: N/A  . Years of education: N/A   Occupational History  . Not on file.   Social History Main Topics  . Smoking status: Never Smoker  . Smokeless tobacco: Never Used  . Alcohol use No  . Drug use: No  . Sexual activity: No   Other Topics Concern  . Not on file   Social History Narrative  . No narrative on file    Past  Surgical History:  Procedure Laterality Date  . APPENDECTOMY    . BREAST BIOPSY Left    20 years ago  . BREAST BIOPSY Right 2008  . BREAST EXCISIONAL BIOPSY     rt 2008 lt "20 years ago"  . CARDIAC CATHETERIZATION    . CATARACT EXTRACTION W/PHACO Left 08/15/2015   Procedure: CATARACT EXTRACTION PHACO AND INTRAOCULAR LENS PLACEMENT (IOC);  Surgeon: Estill Cotta, MD;  Location: [facility] ORS;  Service: Ophthalmology;  Laterality: Left;  [place] 01:28 AP% 26.4 CDE 35.73 fluid pack # [id number]  . COLONOSCOPY WITH PROPOFOL N/A 01/16/2016   Procedure: COLONOSCOPY WITH PROPOFOL;  Surgeon: Manya Silvas, MD;  Location: [facility] ENDOSCOPY;  Service: Endoscopy;  Laterality: N/A;  . CORONARY ANGIOPLASTY WITH STENT PLACEMENT    . CORONARY ARTERY BYPASS GRAFT     with LIMA  to the LAD, SVG to OM1, SVG to PDA  . ESOPHAGOGASTRODUODENOSCOPY (EGD) WITH PROPOFOL N/A 01/16/2016   Procedure: ESOPHAGOGASTRODUODENOSCOPY (EGD) WITH PROPOFOL;  Surgeon: Manya Silvas, MD;  Location: [facility] ENDOSCOPY;  Service: Endoscopy;  Laterality: N/A;  . RENAL ARTERY STENT     left, By Dr Hulda Humphrey  . REPLACEMENT TOTAL KNEE  03/25/2013   right  . TONSILLECTOMY      Family History  Problem Relation Age of Onset  . Heart disease Father   .  Other Brother        open heart surgery  . Cancer Brother   . Other Brother        open heart surgery  . Other Brother        open heart surgery  . Hyperlipidemia Daughter   . Breast cancer Neg Hx     Allergies  Allergen Reactions  . Ciprofloxacin Other (See Comments)  . Codeine     Nausea   . Erythromycin Nausea And Vomiting  . Tramadol     Current Outpatient Prescriptions on File Prior to Visit  Medication Sig Dispense Refill  . albuterol (PROVENTIL HFA;VENTOLIN HFA) 108 (90 Base) MCG/ACT inhaler Inhale 2 puffs into the lungs every 4 (four) hours as needed for wheezing. 1 Inhaler 1  . aspirin 81 MG tablet Take 81 mg by mouth daily.     . benzonatate (TESSALON) 200 MG capsule Take  1 capsule (200 mg total) by mouth 3 (three) times daily as needed. Swallow whole, do not bite the pill 30 capsule 1  . clopidogrel (PLAVIX) 75 MG tablet Take 1 tablet (75 mg total) by mouth daily. 90 tablet 3  . doxycycline (VIBRA-TABS) 100 MG tablet Take 1 tablet (100 mg total) by mouth 2 (two) times daily. With non dairy food 14 tablet 0  . glucose blood (ONE TOUCH TEST STRIPS) test strip USE AS DIRECTED TO CHECK BLOOD SUGAR ONCE DAILY E08.00 100 each 5  . Lancets (ONETOUCH ULTRASOFT) lancets USE AS INSTRUCTED TO TEST BLOOD SUGAR ONCE DAILY E08.00 100 each 12  . lisinopril (PRINIVIL,ZESTRIL) 20 MG tablet Take 1 tablet (20 mg total) by mouth daily. 90 tablet 3  . metFORMIN (GLUCOPHAGE) 1000 MG tablet TAKE 1 TABLET BY MOUTH TWICE DAILY    . metoprolol succinate (TOPROL-XL) 25 MG 24 hr tablet Take 1 tablet (25 mg total) by mouth daily. (Patient taking differently: Take 12.5 mg by mouth daily. ) 90 tablet 3  . mirtazapine (REMERON) 30 MG tablet TAKE 1 TABLET BY MOUTH AT BEDTIME 30 tablet 3  . Multiple Vitamins-Minerals (MULTIVITAMIN WITH MINERALS) tablet Take 1 tablet by mouth daily.    Marland Kitchen oseltamivir (TAMIFLU) 75 MG capsule Take 1 capsule (75 mg total) by mouth 2 (two) times daily. 10 capsule 0  . pantoprazole (PROTONIX) 40 MG tablet Take 1 tablet (40 mg total) by mouth daily. 90 tablet 2  . rosuvastatin (CRESTOR) 40 MG tablet Take 1 tablet (40 mg total) by mouth daily. 90 tablet 4  . nitroGLYCERIN (NITROSTAT) 0.4 MG SL tablet Place 1 tablet (0.4 mg total) under the tongue every 5 (five) minutes as needed for chest pain. 25 tablet 6   No current facility-administered medications on file prior to visit.     BP 126/74   Pulse 89   Temp 98 F (36.7 C) (Oral)   Ht 5\' 3"  (1.6 m)   Wt 130 lb 1.9 oz (59 kg)   SpO2 98%   BMI 23.05 kg/m    Objective:   Physical Exam  Constitutional: She appears well-nourished.  HENT:  Right Ear: Tympanic membrane and ear canal normal.  Left Ear: Tympanic  membrane and ear canal normal.  Nose: Right sinus exhibits no maxillary sinus tenderness and no frontal sinus tenderness. Left sinus exhibits no maxillary sinus tenderness and no frontal sinus tenderness.  Mouth/Throat: Oropharynx is clear and moist.  Eyes: Conjunctivae are normal.  Neck: Neck supple.  Cardiovascular: Normal rate and regular rhythm.   Pulmonary/Chest: Effort normal. She has  no decreased breath sounds. She has wheezes in the right upper field. She has rhonchi in the left lower field. She has no rales.  Lymphadenopathy:    She has no cervical adenopathy.  Skin: Skin is warm and dry.          Assessment & Plan:  Acute Bronchitis:  Compliant to Doxycycline, Mucinex, albuterol. Exam today with mild wheezing and rhonchi as noted, otherwise stable respiratory exam. Vitals good. Will have her continue Doxycycline, Mucinex, albuterol, tessalon pearls. Encouraged proper hydration and rest. Reassuring exam and based off of prior notes, seems improved today.  Sheral Flow, NP

## 2017-01-25 NOTE — Progress Notes (Signed)
Pre visit review using our clinic review tool, if applicable. No additional management support is needed unless otherwise documented below in the visit note. 

## 2017-02-01 ENCOUNTER — Encounter: Payer: Self-pay | Admitting: Family Medicine

## 2017-02-01 ENCOUNTER — Ambulatory Visit (INDEPENDENT_AMBULATORY_CARE_PROVIDER_SITE_OTHER): Payer: Medicare HMO | Admitting: Family Medicine

## 2017-02-01 VITALS — BP 106/56 | HR 83 | Temp 97.6°F | Ht 63.0 in | Wt 130.0 lb

## 2017-02-01 DIAGNOSIS — J209 Acute bronchitis, unspecified: Secondary | ICD-10-CM

## 2017-02-01 DIAGNOSIS — E559 Vitamin D deficiency, unspecified: Secondary | ICD-10-CM

## 2017-02-01 DIAGNOSIS — G933 Postviral fatigue syndrome: Secondary | ICD-10-CM | POA: Diagnosis not present

## 2017-02-01 DIAGNOSIS — R5383 Other fatigue: Secondary | ICD-10-CM

## 2017-02-01 DIAGNOSIS — G9331 Postviral fatigue syndrome: Secondary | ICD-10-CM

## 2017-02-01 LAB — CBC WITH DIFFERENTIAL/PLATELET
BASOS ABS: 0 {cells}/uL (ref 0–200)
Basophils Relative: 0 %
EOS ABS: 78 {cells}/uL (ref 15–500)
EOS PCT: 1 %
HCT: 34 % — ABNORMAL LOW (ref 35.0–45.0)
Hemoglobin: 10.6 g/dL — ABNORMAL LOW (ref 11.7–15.5)
Lymphocytes Relative: 30 %
Lymphs Abs: 2340 cells/uL (ref 850–3900)
MCH: 25.7 pg — AB (ref 27.0–33.0)
MCHC: 31.2 g/dL — ABNORMAL LOW (ref 32.0–36.0)
MCV: 82.5 fL (ref 80.0–100.0)
MONOS PCT: 7 %
MPV: 9.1 fL (ref 7.5–12.5)
Monocytes Absolute: 546 cells/uL (ref 200–950)
NEUTROS ABS: 4836 {cells}/uL (ref 1500–7800)
Neutrophils Relative %: 62 %
PLATELETS: 293 10*3/uL (ref 140–400)
RBC: 4.12 MIL/uL (ref 3.80–5.10)
RDW: 15.9 % — ABNORMAL HIGH (ref 11.0–15.0)
WBC: 7.8 10*3/uL (ref 3.8–10.8)

## 2017-02-01 LAB — COMPREHENSIVE METABOLIC PANEL
ALT: 13 U/L (ref 6–29)
AST: 22 U/L (ref 10–35)
Albumin: 4.3 g/dL (ref 3.6–5.1)
Alkaline Phosphatase: 55 U/L (ref 33–130)
BUN: 33 mg/dL — AB (ref 7–25)
CHLORIDE: 104 mmol/L (ref 98–110)
CO2: 22 mmol/L (ref 20–31)
CREATININE: 1.31 mg/dL — AB (ref 0.60–0.93)
Calcium: 10.7 mg/dL — ABNORMAL HIGH (ref 8.6–10.4)
GLUCOSE: 127 mg/dL — AB (ref 65–99)
Potassium: 5.2 mmol/L (ref 3.5–5.3)
SODIUM: 141 mmol/L (ref 135–146)
TOTAL PROTEIN: 6.6 g/dL (ref 6.1–8.1)
Total Bilirubin: 0.4 mg/dL (ref 0.2–1.2)

## 2017-02-01 NOTE — Progress Notes (Signed)
Subjective:    Patient ID: Tracey [name], female    DOB: 10/23/1935, 81 y.o.   MRN: [id number]  HPI Here for weakness after influenza   Wt Readings from Last 3 Encounters:  02/01/17 130 lb (59 kg)  01/25/17 130 lb 1.9 oz (59 kg)  01/21/17 134 lb (60.8 kg)   Finally feeling some better today   Has really been fatigued   Cough is improving  Good night last night - and some better now  Was prev having bad coughing spells -esp at night   Daughter was with her (one that sees her every day) - she is upset about pt's physical health -implicating that pt is not telling us everything  Says yesterday she got in the shower - had a hard time getting out and then was too weak to talk in whole sentences  She is very worried about her  Says she has episodes of weakness since her flu that are severe at times  Aware she looks very good today She states she is better for a while and then worse again  States she has been exceptionally weak  Wants labs done  Concerned about vit D - which is taken care of by her gyn (it was never called in)-   - I explained that they need to call that office back as I am not aware of her level or what dose she was px    Patient Active Problem List   Diagnosis Date Noted  . Vitamin D deficiency 02/03/2017  . Post viral syndrome 02/01/2017  . Acute bronchitis 01/21/2017  . Well woman exam 11/05/2016  . Vitamin B12 deficiency 11/05/2016  . Herpes zoster 10/02/2016  . Depression 04/19/2016  . Fatigue 02/06/2016  . Angina pectoris ([place]) 08/26/2013  . Hypertension 01/22/2012  . Coronary heart disease 01/22/2012  . S/P CABG x 3 01/22/2012  . Hyperlipidemia 01/22/2012  . Diabetes mellitus ([place]) 01/22/2012  . Renal artery stenosis ([place]) 01/22/2012   Past Medical History:  Diagnosis Date  . Anxiety   . Atrial fibrillation ([place])    history  . Coronary artery disease   . Diabetes mellitus    non insulin dependent  . GERD (gastroesophageal reflux disease)    . Heart murmur   . Hyperlipidemia   . Hypertension   . Peripheral vascular disease ([place])   . UTI (urinary tract infection)    Past Surgical History:  Procedure Laterality Date  . APPENDECTOMY    . BREAST BIOPSY Left    20 years ago  . BREAST BIOPSY Right 2008  . BREAST EXCISIONAL BIOPSY     rt 2008 lt "20 years ago"  . CARDIAC CATHETERIZATION    . CATARACT EXTRACTION W/PHACO Left 08/15/2015   Procedure: CATARACT EXTRACTION PHACO AND INTRAOCULAR LENS PLACEMENT (IOC);  Surgeon: Estill Cotta, MD;  Location: [facility] ORS;  Service: Ophthalmology;  Laterality: Left;  [place] 01:28 AP% 26.4 CDE 35.73 fluid pack # [id number]  . COLONOSCOPY WITH PROPOFOL N/A 01/16/2016   Procedure: COLONOSCOPY WITH PROPOFOL;  Surgeon: Manya Silvas, MD;  Location: [facility] ENDOSCOPY;  Service: Endoscopy;  Laterality: N/A;  . CORONARY ANGIOPLASTY WITH STENT PLACEMENT    . CORONARY ARTERY BYPASS GRAFT     with LIMA  to the LAD, SVG to OM1, SVG to PDA  . ESOPHAGOGASTRODUODENOSCOPY (EGD) WITH PROPOFOL N/A 01/16/2016   Procedure: ESOPHAGOGASTRODUODENOSCOPY (EGD) WITH PROPOFOL;  Surgeon: Manya Silvas, MD;  Location: [facility] ENDOSCOPY;  Service: Endoscopy;  Laterality: N/A;  .  RENAL ARTERY STENT     left, By Dr Hulda Humphrey  . REPLACEMENT TOTAL KNEE  03/25/2013   right  . TONSILLECTOMY     Social History  Substance Use Topics  . Smoking status: Never Smoker  . Smokeless tobacco: Never Used  . Alcohol use No   Family History  Problem Relation Age of Onset  . Heart disease Father   . Other Brother        open heart surgery  . Cancer Brother   . Other Brother        open heart surgery  . Other Brother        open heart surgery  . Hyperlipidemia Daughter   . Breast cancer Neg Hx    Allergies  Allergen Reactions  . Ciprofloxacin Other (See Comments)  . Codeine     Nausea   . Erythromycin Nausea And Vomiting  . Tramadol    Current Outpatient Prescriptions on File Prior to Visit  Medication Sig Dispense Refill    . albuterol (PROVENTIL HFA;VENTOLIN HFA) 108 (90 Base) MCG/ACT inhaler Inhale 2 puffs into the lungs every 4 (four) hours as needed for wheezing. 1 Inhaler 1  . aspirin 81 MG tablet Take 81 mg by mouth daily.     . benzonatate (TESSALON) 200 MG capsule Take 1 capsule (200 mg total) by mouth 3 (three) times daily as needed. Swallow whole, do not bite the pill 30 capsule 1  . clopidogrel (PLAVIX) 75 MG tablet Take 1 tablet (75 mg total) by mouth daily. 90 tablet 3  . glucose blood (ONE TOUCH TEST STRIPS) test strip USE AS DIRECTED TO CHECK BLOOD SUGAR ONCE DAILY E08.00 100 each 5  . Lancets (ONETOUCH ULTRASOFT) lancets USE AS INSTRUCTED TO TEST BLOOD SUGAR ONCE DAILY E08.00 100 each 12  . lisinopril (PRINIVIL,ZESTRIL) 20 MG tablet Take 1 tablet (20 mg total) by mouth daily. 90 tablet 3  . metFORMIN (GLUCOPHAGE) 1000 MG tablet TAKE 1 TABLET BY MOUTH TWICE DAILY    . metoprolol succinate (TOPROL-XL) 25 MG 24 hr tablet Take 1 tablet (25 mg total) by mouth daily. (Patient taking differently: Take 12.5 mg by mouth daily. ) 90 tablet 3  . mirtazapine (REMERON) 30 MG tablet TAKE 1 TABLET BY MOUTH AT BEDTIME 30 tablet 3  . Multiple Vitamins-Minerals (MULTIVITAMIN WITH MINERALS) tablet Take 1 tablet by mouth daily.    . pantoprazole (PROTONIX) 40 MG tablet Take 1 tablet (40 mg total) by mouth daily. 90 tablet 2  . rosuvastatin (CRESTOR) 40 MG tablet Take 1 tablet (40 mg total) by mouth daily. 90 tablet 4  . nitroGLYCERIN (NITROSTAT) 0.4 MG SL tablet Place 1 tablet (0.4 mg total) under the tongue every 5 (five) minutes as needed for chest pain. 25 tablet 6   No current facility-administered medications on file prior to visit.     Review of Systems Review of Systems  Constitutional: Negative for fever, appetite change, fatigue and unexpected weight change. (pos for episodes of profound fatigue when not here)  Eyes: Negative for pain and visual disturbance.  Respiratory: Negative for wheeze and shortness of  breath.  pos for cough that is much improved  Cardiovascular: Negative for cp or palpitations    Gastrointestinal: Negative for nausea, diarrhea and constipation.  Genitourinary: Negative for urgency and frequency.  Skin: Negative for pallor or rash   Neurological: Negative for weakness, light-headedness, numbness and headaches.  Hematological: Negative for adenopathy. Does not bruise/bleed easily.  Psychiatric/Behavioral: Negative for dysphoric mood.  The patient is not nervous/anxious.         Objective:   Physical Exam  Constitutional: She appears well-developed and well-nourished. No distress.  Well appearing elderly female  HENT:  Head: Normocephalic and atraumatic.  Right Ear: External ear normal.  Left Ear: External ear normal.  Nose: Nose normal.  Mouth/Throat: Oropharynx is clear and moist.  Nares are boggy No sinus tenderness  Eyes: Conjunctivae and EOM are normal. Pupils are equal, round, and reactive to light. Right eye exhibits no discharge. Left eye exhibits no discharge. No scleral icterus.  Neck: Normal range of motion. Neck supple. No JVD present. Carotid bruit is not present. No thyromegaly present.  Cardiovascular: Normal rate, regular rhythm, normal heart sounds and intact distal pulses.  Exam reveals no gallop.   Pulmonary/Chest: Effort normal and breath sounds normal. No respiratory distress. She has no wheezes. She has no rales. She exhibits no tenderness.  Good air exch No rales or rhonchi No sob   Abdominal: Soft. Bowel sounds are normal. She exhibits no distension and no mass. There is no tenderness.  Musculoskeletal: She exhibits no edema or tenderness.  Lymphadenopathy:    She has no cervical adenopathy.  Neurological: She is alert. She has normal reflexes. No cranial nerve deficit. She exhibits normal muscle tone. Coordination normal.  Skin: Skin is warm and dry. No rash noted. No erythema. No pallor.  Nl skin color and turgor No signs of dehydration     Psychiatric: She has a normal mood and affect.  Pt has a nl affect - but does seem to be mildly stressed by interaction with daughter in the exam room           Assessment & Plan:   Problem List Items Addressed This Visit      Respiratory   Acute bronchitis - Primary    Clinical improvement after flu  Very re assuring exam today- but daughter states she has been doing very poorly at home and is quite worried about her  Will do labs for fatigue today  Rev last visits and cxr  Cough is much better         Other   Fatigue    Per pt's daughter- she has experienced episodes of severe fatigue at home for which she refused to go to the hospital or urgent care  In the office -pt looks much better and in fact - energetic  Re assuring exam  Pt minimizes her symptoms at home  Labs today for fatigue  She needs to call her gyn office regarding D supplementation as I was not the one to px  I strongly recommend f/u with PCP as well regarding multiple concerns of her daughter       Relevant Orders   CBC with Differential/Platelet (Completed)   Comprehensive metabolic panel (Completed)   TSH (Completed)   Post viral syndrome    I suspect this is cause of her fatigue Reassuring exam today and pt does not seem to be unwell  Labs today for other causes of fatigue       Vitamin D deficiency    Pt sees gyn for this  I recommend f/u for this - as I do not know her level or px strength she was given

## 2017-02-01 NOTE — Patient Instructions (Signed)
Drink fluids and rest and get good nutrition  Labs today for fatigue I think you have a post viral syndrome from the flu (not unexpected but we need to watch your progress)  Take care of yourself  If your symptoms worsen please alert [place] immediately

## 2017-02-02 LAB — TSH: TSH: 1.24 m[IU]/L

## 2017-02-03 DIAGNOSIS — E559 Vitamin D deficiency, unspecified: Secondary | ICD-10-CM | POA: Insufficient documentation

## 2017-02-03 NOTE — Assessment & Plan Note (Signed)
Pt sees gyn for this  I recommend f/u for this - as I do not know her level or px strength she was given

## 2017-02-03 NOTE — Assessment & Plan Note (Signed)
I suspect this is cause of her fatigue Reassuring exam today and pt does not seem to be unwell  Labs today for other causes of fatigue

## 2017-02-03 NOTE — Assessment & Plan Note (Signed)
Per pt's daughter- she has experienced episodes of severe fatigue at home for which she refused to go to the hospital or urgent care  In the office -pt looks much better and in fact - energetic  Re assuring exam  Pt minimizes her symptoms at home  Labs today for fatigue  She needs to call her gyn office regarding D supplementation as I was not the one to px  I strongly recommend f/u with PCP as well regarding multiple concerns of her daughter

## 2017-02-03 NOTE — Assessment & Plan Note (Signed)
Clinical improvement after flu  Very re assuring exam today- but daughter states she has been doing very poorly at home and is quite worried about her  Will do labs for fatigue today  Rev last visits and cxr  Cough is much better

## 2017-02-04 ENCOUNTER — Telehealth: Payer: Self-pay | Admitting: Obstetrics and Gynecology

## 2017-02-04 ENCOUNTER — Other Ambulatory Visit: Payer: Self-pay | Admitting: *Deleted

## 2017-02-04 DIAGNOSIS — E559 Vitamin D deficiency, unspecified: Secondary | ICD-10-CM

## 2017-02-04 MED ORDER — METFORMIN HCL 1000 MG PO TABS: 1000.0000 mg | ORAL_TABLET | Freq: Two times a day (BID) | ORAL | 1 refills | Status: DC

## 2017-02-04 MED ORDER — VITAMIN D (ERGOCALCIFEROL) 1.25 MG (50000 UNIT) PO CAPS: 50000.0000 [IU] | ORAL_CAPSULE | ORAL | 0 refills | Status: DC

## 2017-02-04 NOTE — Telephone Encounter (Signed)
RX sent in after reviewing pt's vit d lab.

## 2017-02-04 NOTE — Telephone Encounter (Signed)
Per patient daughter her mom needs the vitamin D called into her pharmacy - Melody wanted patient to start taking this a month ago and it hasn't been called into the pharmacy yet [facility]  [phone]

## 2017-02-05 ENCOUNTER — Ambulatory Visit
Admission: RE | Admit: 2017-02-05 | Discharge: 2017-02-05 | Disposition: A | Discharge status: Home / Self Care | Payer: Medicare HMO | Source: Ambulatory Visit | Attending: Obstetrics and Gynecology | Admitting: Obstetrics and Gynecology

## 2017-02-05 DIAGNOSIS — M8580 Other specified disorders of bone density and structure, unspecified site: Secondary | ICD-10-CM | POA: Insufficient documentation

## 2017-02-05 DIAGNOSIS — Z780 Asymptomatic menopausal state: Secondary | ICD-10-CM | POA: Insufficient documentation

## 2017-02-05 DIAGNOSIS — M85852 Other specified disorders of bone density and structure, left thigh: Secondary | ICD-10-CM | POA: Diagnosis not present

## 2017-02-12 ENCOUNTER — Encounter: Payer: Self-pay | Admitting: Family Medicine

## 2017-02-12 ENCOUNTER — Ambulatory Visit (INDEPENDENT_AMBULATORY_CARE_PROVIDER_SITE_OTHER): Payer: Medicare HMO | Admitting: Family Medicine

## 2017-02-12 VITALS — BP 112/72 | HR 77 | Temp 98.2°F | Ht 63.0 in | Wt 134.5 lb

## 2017-02-12 DIAGNOSIS — I701 Atherosclerosis of renal artery: Secondary | ICD-10-CM

## 2017-02-12 DIAGNOSIS — F329 Major depressive disorder, single episode, unspecified: Secondary | ICD-10-CM

## 2017-02-12 DIAGNOSIS — I2510 Atherosclerotic heart disease of native coronary artery without angina pectoris: Secondary | ICD-10-CM | POA: Diagnosis not present

## 2017-02-12 DIAGNOSIS — R69 Illness, unspecified: Secondary | ICD-10-CM | POA: Diagnosis not present

## 2017-02-12 DIAGNOSIS — I209 Angina pectoris, unspecified: Secondary | ICD-10-CM

## 2017-02-12 DIAGNOSIS — E559 Vitamin D deficiency, unspecified: Secondary | ICD-10-CM

## 2017-02-12 DIAGNOSIS — E784 Other hyperlipidemia: Secondary | ICD-10-CM | POA: Diagnosis not present

## 2017-02-12 DIAGNOSIS — D649 Anemia, unspecified: Secondary | ICD-10-CM | POA: Diagnosis not present

## 2017-02-12 DIAGNOSIS — E7849 Other hyperlipidemia: Secondary | ICD-10-CM

## 2017-02-12 DIAGNOSIS — D61818 Other pancytopenia: Secondary | ICD-10-CM | POA: Insufficient documentation

## 2017-02-12 DIAGNOSIS — E538 Deficiency of other specified B group vitamins: Secondary | ICD-10-CM

## 2017-02-12 DIAGNOSIS — E0800 Diabetes mellitus due to underlying condition with hyperosmolarity without nonketotic hyperglycemic-hyperosmolar coma (NKHHC): Secondary | ICD-10-CM

## 2017-02-12 DIAGNOSIS — I10 Essential (primary) hypertension: Secondary | ICD-10-CM | POA: Diagnosis not present

## 2017-02-12 DIAGNOSIS — I2583 Coronary atherosclerosis due to lipid rich plaque: Secondary | ICD-10-CM

## 2017-02-12 DIAGNOSIS — F32A Depression, unspecified: Secondary | ICD-10-CM

## 2017-02-12 LAB — CBC WITH DIFFERENTIAL/PLATELET
Basophils Absolute: 0 10*3/uL (ref 0.0–0.1)
Basophils Relative: 0.5 % (ref 0.0–3.0)
EOS ABS: 0.1 10*3/uL (ref 0.0–0.7)
EOS PCT: 2.4 % (ref 0.0–5.0)
HCT: 32 % — ABNORMAL LOW (ref 36.0–46.0)
Hemoglobin: 10.4 g/dL — ABNORMAL LOW (ref 12.0–15.0)
LYMPHS ABS: 1.4 10*3/uL (ref 0.7–4.0)
Lymphocytes Relative: 29.4 % (ref 12.0–46.0)
MCHC: 32.6 g/dL (ref 30.0–36.0)
MCV: 83.2 fl (ref 78.0–100.0)
MONO ABS: 0.4 10*3/uL (ref 0.1–1.0)
Monocytes Relative: 8.6 % (ref 3.0–12.0)
NEUTROS ABS: 2.8 10*3/uL (ref 1.4–7.7)
NEUTROS PCT: 59.1 % (ref 43.0–77.0)
PLATELETS: 210 10*3/uL (ref 150.0–400.0)
RBC: 3.85 Mil/uL — ABNORMAL LOW (ref 3.87–5.11)
RDW: 16.3 % — AB (ref 11.5–15.5)
WBC: 4.7 10*3/uL (ref 4.0–10.5)

## 2017-02-12 LAB — BASIC METABOLIC PANEL
BUN: 18 mg/dL (ref 6–23)
CHLORIDE: 106 meq/L (ref 96–112)
CO2: 25 mEq/L (ref 19–32)
CREATININE: 0.99 mg/dL (ref 0.40–1.20)
Calcium: 10.3 mg/dL (ref 8.4–10.5)
GFR: 57.58 mL/min — ABNORMAL LOW (ref >60.00)
Glucose, Bld: 165 mg/dL — ABNORMAL HIGH (ref 70–99)
POTASSIUM: 4.6 meq/L (ref 3.5–5.1)
Sodium: 141 mEq/L (ref 135–145)

## 2017-02-12 LAB — FERRITIN: FERRITIN: 13.4 ng/mL (ref 10.0–291.0)

## 2017-02-12 LAB — HEMOGLOBIN A1C: HEMOGLOBIN A1C: 7.9 % — AB (ref 4.6–6.5)

## 2017-02-12 NOTE — Patient Instructions (Addendum)
For cholesterol:  Avoid red meat/ fried foods/ egg yolks/ fatty breakfast meats/ butter, cheese and high fat dairy/ and shellfish    Get back on your diabetic diet as well   Mix up your blood glucose testing - check some days in the am fasting, and some days 2 hours after a meal  Keep a food diary   If you are interested in the new shingles vaccine (Shingrix) - call your insurance to check on coverage,( you should not get it within 1 month of other vaccines) , then call us for a prescription  for it to take to a pharmacy that gives the shot , or make a nurse visit to get it here depending on your coverage  Even though you are on the high dose vitamin D weekly - do also take 2000 iu of vitamin D3 over the counter   Get back to exercise slowly as tolerated  Continue 64 oz water per day to keep kidneys happy and avoid utis   Labs today

## 2017-02-12 NOTE — Progress Notes (Signed)
Subjective:    Patient ID: Tracey [name], female    DOB: 03/30/1936, 81 y.o.   MRN: [id number]  HPI Here for visit to get ext for pcp from Dr Deborra Medina    She has been seen recently for influenza with bronchitis and post viral syndrome  Feeling better!  Getting energy back   Wt Readings from Last 3 Encounters:  02/12/17 134 lb 8 oz (61 kg)  02/01/17 130 lb (59 kg)  01/25/17 130 lb 1.9 oz (59 kg)   bmi 23.8 Appetite is coming back now - happy about that   Hx of CAD and sees Dr Rockey Situ  Takes plavix  Had CABG times 3  No angina lately -feeling good  Sees him every 6 months (seen in march)    bp is stable today (is well controlled)  No cp or palpitations or headaches or edema  No side effects to medicines  BP Readings from Last 3 Encounters:  02/12/17 112/72  02/01/17 (!) 106/56  01/25/17 126/74     Takes lisinopril  Also metoprolol xl - she cuts it in 1/2 - and Dr Rockey Situ told her to split it   Also renal artery stenosis -with a stent  Sees vasc/ Dr Lucky Cowboy - last visit stent was open and doing well  Lab Results  Component Value Date   CREATININE 1.31 (H) 02/01/2017   BUN 33 (H) 02/01/2017   NA 141 02/01/2017   K 5.2 02/01/2017   CL 104 02/01/2017   CO2 22 02/01/2017   cr was elevated last time  She is drinking 4   16 oz bottles per day of water (at least)   Hx of DM2 Lab Results  Component Value Date   HGBA1C 7.9 (H) 10/26/2016   Takes metformin 1000 mg twice daily  Pt states she has been sick quite a bit since that time/not eating as well as she should  Has had DM ed and has a good understanding of DM diet  Does not eat bread/no fast food  No sugar drinks  No sweets -she does not want them anyway  When she feels good she walks -usually 40 minutes a day or more (when not sick) She has been checking blood glucose -usually 140s-160s (in am)   Eye exam is utd  Hx of depression - grief from loss of husband  She does not think she is depressed Mirtazapine 30 mg    Does not sleep well     Hx of shingles in the past - would consider shingrix in the future if affordable   Hx of hyperlipidemia Lab Results  Component Value Date   CHOL 148 10/26/2016   HDL 40.10 10/26/2016   LDLCALC 82 10/26/2016   TRIG 129.0 10/26/2016   CHOLHDL 4 10/26/2016   On crestor and diet (some leg pain)  LDL is not at goal of 70 or less  Pt states she avoids red meat  Eats more chicken and fish  Does eat fried   Hx of OP   (mother also had it) She has lost ht  dexa 5/18 Takes her ca/mag/zinc  With hx of vit D def - was 29.9 in   5/17-- gyn is treating for that with high dose ergocalciferol  Has not taken px med for her OP   Sees gyn- NP [place]  She has never had a bad pap- just does internal exams    Hx of B12 def Lab Results  Component Value Date  VITAMINB12 278 10/26/2016  she gets a B12 shot once monthly  Also taking oral B12     Family hx  Brother with cancer- colon cancer (died at 39) ---pt had colonoscopy 5/17 -- ? 4 y recall (2 tiny polyps per pt)  Father died 89 with CAD Mother lived to 31  Lab Results  Component Value Date   WBC 7.8 02/01/2017   HGB 10.6 (L) 02/01/2017   HCT 34.0 (L) 02/01/2017   MCV 82.5 02/01/2017   PLT 293 02/01/2017  does not think iron def  ? Anemia of chronic dz  Did not have a path smear   Has hx of frequent utis  Has urgency w/o incontinence   Last mammogram 3/18 -nl   Patient Active Problem List   Diagnosis Date Noted  . Anemia 02/12/2017  . Vitamin D deficiency 02/03/2017  . Post viral syndrome 02/01/2017  . Well woman exam 11/05/2016  . Vitamin B12 deficiency 11/05/2016  . Depression 04/19/2016  . Fatigue 02/06/2016  . Angina pectoris ([place]) 08/26/2013  . Hypertension 01/22/2012  . Coronary heart disease 01/22/2012  . S/P CABG x 3 01/22/2012  . Hyperlipidemia 01/22/2012  . Diabetes mellitus ([place]) 01/22/2012  . Renal artery stenosis ([place]) 01/22/2012   Past Medical History:  Diagnosis  Date  . Anxiety   . Atrial fibrillation ([place])    history  . Coronary artery disease   . Diabetes mellitus    non insulin dependent  . GERD (gastroesophageal reflux disease)   . Heart murmur   . Hyperlipidemia   . Hypertension   . Peripheral vascular disease ([place])   . UTI (urinary tract infection)    Past Surgical History:  Procedure Laterality Date  . APPENDECTOMY    . BREAST BIOPSY Left    20 years ago  . BREAST BIOPSY Right 2008  . BREAST EXCISIONAL BIOPSY     rt 2008 lt "20 years ago"  . CARDIAC CATHETERIZATION    . CATARACT EXTRACTION W/PHACO Left 08/15/2015   Procedure: CATARACT EXTRACTION PHACO AND INTRAOCULAR LENS PLACEMENT (IOC);  Surgeon: Estill Cotta, MD;  Location: [facility] ORS;  Service: Ophthalmology;  Laterality: Left;  [place] 01:28 AP% 26.4 CDE 35.73 fluid pack # [id number]  . COLONOSCOPY WITH PROPOFOL N/A 01/16/2016   Procedure: COLONOSCOPY WITH PROPOFOL;  Surgeon: Manya Silvas, MD;  Location: [facility] ENDOSCOPY;  Service: Endoscopy;  Laterality: N/A;  . CORONARY ANGIOPLASTY WITH STENT PLACEMENT    . CORONARY ARTERY BYPASS GRAFT     with LIMA  to the LAD, SVG to OM1, SVG to PDA  . ESOPHAGOGASTRODUODENOSCOPY (EGD) WITH PROPOFOL N/A 01/16/2016   Procedure: ESOPHAGOGASTRODUODENOSCOPY (EGD) WITH PROPOFOL;  Surgeon: Manya Silvas, MD;  Location: [facility] ENDOSCOPY;  Service: Endoscopy;  Laterality: N/A;  . RENAL ARTERY STENT     left, By Dr Hulda Humphrey  . REPLACEMENT TOTAL KNEE  03/25/2013   right  . TONSILLECTOMY     Social History  Substance Use Topics  . Smoking status: Never Smoker  . Smokeless tobacco: Never Used  . Alcohol use No   Family History  Problem Relation Age of Onset  . Heart disease Father   . Other Brother        open heart surgery  . Cancer Brother   . Other Brother        open heart surgery  . Other Brother        open heart surgery  . Hyperlipidemia Daughter   . Breast cancer Neg Hx  Allergies  Allergen Reactions  . Ciprofloxacin Other  (See Comments)  . Codeine     Nausea   . Erythromycin Nausea And Vomiting  . Tramadol    Current Outpatient Prescriptions on File Prior to Visit  Medication Sig Dispense Refill  . albuterol (PROVENTIL HFA;VENTOLIN HFA) 108 (90 Base) MCG/ACT inhaler Inhale 2 puffs into the lungs every 4 (four) hours as needed for wheezing. 1 Inhaler 1  . aspirin 81 MG tablet Take 81 mg by mouth daily.     . clopidogrel (PLAVIX) 75 MG tablet Take 1 tablet (75 mg total) by mouth daily. 90 tablet 3  . glucose blood (ONE TOUCH TEST STRIPS) test strip USE AS DIRECTED TO CHECK BLOOD SUGAR ONCE DAILY E08.00 100 each 5  . Lancets (ONETOUCH ULTRASOFT) lancets USE AS INSTRUCTED TO TEST BLOOD SUGAR ONCE DAILY E08.00 100 each 12  . lisinopril (PRINIVIL,ZESTRIL) 20 MG tablet Take 1 tablet (20 mg total) by mouth daily. 90 tablet 3  . metFORMIN (GLUCOPHAGE) 1000 MG tablet Take 1 tablet (1,000 mg total) by mouth 2 (two) times daily. 180 tablet 1  . metoprolol succinate (TOPROL-XL) 25 MG 24 hr tablet Take 1 tablet (25 mg total) by mouth daily. (Patient taking differently: Take 12.5 mg by mouth daily. ) 90 tablet 3  . mirtazapine (REMERON) 30 MG tablet TAKE 1 TABLET BY MOUTH AT BEDTIME 30 tablet 3  . Multiple Vitamins-Minerals (MULTIVITAMIN WITH MINERALS) tablet Take 1 tablet by mouth daily.    . nitroGLYCERIN (NITROSTAT) 0.4 MG SL tablet Place 1 tablet (0.4 mg total) under the tongue every 5 (five) minutes as needed for chest pain. 25 tablet 6  . pantoprazole (PROTONIX) 40 MG tablet Take 1 tablet (40 mg total) by mouth daily. 90 tablet 2  . rosuvastatin (CRESTOR) 40 MG tablet Take 1 tablet (40 mg total) by mouth daily. 90 tablet 4  . Vitamin D, Ergocalciferol, (DRISDOL) 50000 units CAPS capsule Take 1 capsule (50,000 Units total) by mouth every 7 (seven) days. 12 capsule 0   No current facility-administered medications on file prior to visit.      Review of Systems Review of Systems  Constitutional: Negative for fever,  appetite change, and unexpected weight change. Pos for fatigue that is greatly improving  Eyes: Negative for pain and visual disturbance.  Respiratory: Negative for cough and shortness of breath.   Cardiovascular: Negative for cp or palpitations    Gastrointestinal: Negative for nausea, diarrhea and constipation.  Genitourinary: Negative for urgency and frequency. neg for dysuria  Skin: Negative for pallor or rash   Neurological: Negative for weakness, light-headedness, numbness and headaches.  Hematological: Negative for adenopathy. Does not bruise/bleed easily.  Psychiatric/Behavioral: Negative for dysphoric mood. The patient is not nervous/anxious.         Objective:   Physical Exam  Constitutional: She appears well-developed and well-nourished. No distress.  Well appearing elderly female   HENT:  Head: Normocephalic and atraumatic.  Right Ear: External ear normal.  Left Ear: External ear normal.  Nose: Nose normal.  Mouth/Throat: Oropharynx is clear and moist.  Eyes: Conjunctivae and EOM are normal. Pupils are equal, round, and reactive to light. Right eye exhibits no discharge. Left eye exhibits no discharge. No scleral icterus.  Neck: Normal range of motion. Neck supple. No JVD present. Carotid bruit is not present. No thyromegaly present.  Cardiovascular: Normal rate, regular rhythm, normal heart sounds and intact distal pulses.  Exam reveals no gallop.   Pulmonary/Chest: Effort normal and breath  sounds normal. No respiratory distress. She has no wheezes. She has no rales.  Abdominal: Soft. Bowel sounds are normal. She exhibits no distension and no mass. There is no tenderness.  Musculoskeletal: She exhibits no edema or tenderness.  Mild kyphosis  Lymphadenopathy:    She has no cervical adenopathy.  Neurological: She is alert. She has normal reflexes. No cranial nerve deficit. She exhibits normal muscle tone. Coordination normal.  Skin: Skin is warm and dry. No rash noted. No  erythema. No pallor.  sks and lentigines diffusely  No pallor   Psychiatric: She has a normal mood and affect.  Pleasant and talkative  Mentally sharp          Assessment & Plan:   Problem List Items Addressed This Visit      Cardiovascular and Mediastinum   Angina pectoris ([place]) - Primary    No recent symptoms  Sees Dr Rockey Situ      Coronary heart disease    Stable/no recent symptoms Hx of CABG 3 vessel Sees Dr Rockey Situ Takes asa and plavix bp controlled  Lipid control with crestor  Working on better dm control       Hypertension    bp in fair control at this time  BP Readings from Last 1 Encounters:  02/12/17 112/72   No changes needed Disc lifstyle change with low sodium diet and exercise  Lab today/last lab rev      Relevant Orders   Basic metabolic panel (Completed)   Renal artery stenosis ([facility])    Has stent Sees Dr Lucky Cowboy Cholesterol and bp controlled         Endocrine   Diabetes mellitus ([place])    Lab Results  Component Value Date   HGBA1C 7.9 (H) 02/12/2017   Not optimal Not eating DM diet due to illness since feb Re check today- do not exp change Disc plan to rev dm diet materials and get back on track Will get back to exercise as well  On metformin       Relevant Orders   Hemoglobin A1c (Completed)     Other   Anemia    Chronic  utd with GI studies  Ferritin and path smear rev ordered  Suspect anemia of chronic dz      Relevant Orders   CBC with Differential/Platelet (Completed)   Ferritin (Completed)   Pathologist smear review (Completed)   Depression    Well controlled with mirtazapine  Sleeps fair/not great       Hyperlipidemia    Disc goals for lipids and reasons to control them Rev labs with pt Rev low sat fat diet in detail  On crestor and diet  Goal LDL is 70 or less- she will continue to work on diet Has hx of significant vascular dz       Vitamin B12 deficiency    Pt gets monthy B12 shot from her grand  daughter Lab Results  Component Value Date   VITAMINB12 278 10/26/2016         Vitamin D deficiency    This is being treated by her gyn  Disc imp of D to bone and overall health

## 2017-02-13 LAB — PATHOLOGIST SMEAR REVIEW

## 2017-02-13 NOTE — Assessment & Plan Note (Signed)
bp in fair control at this time  BP Readings from Last 1 Encounters:  02/12/17 112/72   No changes needed Disc lifstyle change with low sodium diet and exercise  Lab today/last lab rev

## 2017-02-13 NOTE — Assessment & Plan Note (Signed)
Stable/no recent symptoms Hx of CABG 3 vessel Sees Dr Rockey Situ Takes asa and plavix bp controlled  Lipid control with crestor  Working on better dm control

## 2017-02-13 NOTE — Assessment & Plan Note (Signed)
No recent symptoms  Sees Dr Rockey Situ

## 2017-02-13 NOTE — Assessment & Plan Note (Signed)
Well controlled with mirtazapine  Sleeps fair/not great

## 2017-02-13 NOTE — Assessment & Plan Note (Signed)
Has stent Sees Dr Lucky Cowboy Cholesterol and bp controlled

## 2017-02-13 NOTE — Assessment & Plan Note (Signed)
Lab Results  Component Value Date   HGBA1C 7.9 (H) 02/12/2017   Not optimal Not eating DM diet due to illness since feb Re check today- do not exp change Disc plan to rev dm diet materials and get back on track Will get back to exercise as well  On metformin

## 2017-02-13 NOTE — Assessment & Plan Note (Signed)
Chronic  utd with GI studies  Ferritin and path smear rev ordered  Suspect anemia of chronic dz

## 2017-02-14 ENCOUNTER — Other Ambulatory Visit: Payer: Self-pay | Admitting: Family Medicine

## 2017-02-14 DIAGNOSIS — Z1211 Encounter for screening for malignant neoplasm of colon: Secondary | ICD-10-CM

## 2017-02-14 NOTE — Assessment & Plan Note (Signed)
This is being treated by her gyn  Disc imp of D to bone and overall health

## 2017-02-14 NOTE — Assessment & Plan Note (Signed)
Disc goals for lipids and reasons to control them Rev labs with pt Rev low sat fat diet in detail  On crestor and diet  Goal LDL is 70 or less- she will continue to work on diet Has hx of significant vascular dz

## 2017-02-14 NOTE — Assessment & Plan Note (Signed)
Pt gets monthy B12 shot from her grand daughter Lab Results  Component Value Date   VITAMINB12 278 10/26/2016

## 2017-02-25 ENCOUNTER — Other Ambulatory Visit: Payer: Medicare HMO

## 2017-02-25 DIAGNOSIS — Z1211 Encounter for screening for malignant neoplasm of colon: Secondary | ICD-10-CM

## 2017-02-25 LAB — HEMOCCULT SLIDES (X 3 CARDS)
Fecal Occult Blood: NEGATIVE
OCCULT 1: NEGATIVE
OCCULT 2: NEGATIVE
OCCULT 3: NEGATIVE
OCCULT 4: NEGATIVE
OCCULT 5: NEGATIVE

## 2017-02-26 ENCOUNTER — Other Ambulatory Visit: Payer: Self-pay | Admitting: *Deleted

## 2017-02-26 DIAGNOSIS — R69 Illness, unspecified: Secondary | ICD-10-CM | POA: Diagnosis not present

## 2017-02-26 MED ORDER — ONETOUCH DELICA LANCETS 33G MISC: 2 refills | Status: DC

## 2017-02-26 MED ORDER — GLUCOSE BLOOD VI STRP: ORAL_STRIP | 2 refills | Status: DC

## 2017-03-01 DIAGNOSIS — R69 Illness, unspecified: Secondary | ICD-10-CM | POA: Diagnosis not present

## 2017-03-04 ENCOUNTER — Other Ambulatory Visit: Payer: Self-pay | Admitting: Family Medicine

## 2017-03-04 NOTE — Telephone Encounter (Signed)
done

## 2017-03-04 NOTE — Telephone Encounter (Signed)
Please refill for a year  

## 2017-03-05 ENCOUNTER — Other Ambulatory Visit: Payer: Self-pay | Admitting: Cardiovascular Disease

## 2017-04-16 ENCOUNTER — Other Ambulatory Visit: Payer: Self-pay | Admitting: Family Medicine

## 2017-04-17 NOTE — Telephone Encounter (Signed)
Last refill 01/10/17 #30 +3  Last OV 02/12/17  Ok to refill?

## 2017-04-30 ENCOUNTER — Other Ambulatory Visit: Payer: Self-pay | Admitting: Family Medicine

## 2017-04-30 ENCOUNTER — Other Ambulatory Visit: Payer: Self-pay | Admitting: Obstetrics and Gynecology

## 2017-04-30 ENCOUNTER — Telehealth: Payer: Self-pay | Admitting: *Deleted

## 2017-04-30 DIAGNOSIS — M8580 Other specified disorders of bone density and structure, unspecified site: Secondary | ICD-10-CM | POA: Insufficient documentation

## 2017-04-30 DIAGNOSIS — M810 Age-related osteoporosis without current pathological fracture: Secondary | ICD-10-CM | POA: Insufficient documentation

## 2017-04-30 NOTE — Telephone Encounter (Signed)
-----   Message from Joylene Igo, [place] sent at 04/30/2017  1:44 PM EDT ----- Please mail info on osteopenia

## 2017-04-30 NOTE — Telephone Encounter (Signed)
Mailed info to pt 

## 2017-05-01 ENCOUNTER — Ambulatory Visit (INDEPENDENT_AMBULATORY_CARE_PROVIDER_SITE_OTHER): Payer: Medicare HMO | Admitting: Obstetrics and Gynecology

## 2017-05-01 ENCOUNTER — Encounter: Payer: Self-pay | Admitting: Obstetrics and Gynecology

## 2017-05-01 VITALS — BP 148/58 | HR 68 | Wt 131.5 lb

## 2017-05-01 DIAGNOSIS — R300 Dysuria: Secondary | ICD-10-CM | POA: Diagnosis not present

## 2017-05-01 DIAGNOSIS — Z87440 Personal history of urinary (tract) infections: Secondary | ICD-10-CM | POA: Diagnosis not present

## 2017-05-01 LAB — POCT URINALYSIS DIPSTICK
Bilirubin, UA: NEGATIVE
GLUCOSE UA: NEGATIVE
KETONES UA: NEGATIVE
Nitrite, UA: POSITIVE
PROTEIN UA: NEGATIVE
RBC UA: NEGATIVE
SPEC GRAV UA: 1.015 (ref 1.010–1.025)
UROBILINOGEN UA: 0.2 U/dL
pH, UA: 6.5 (ref 5.0–8.0)

## 2017-05-01 MED ORDER — CEPHALEXIN 500 MG PO CAPS: 500.0000 mg | ORAL_CAPSULE | Freq: Two times a day (BID) | ORAL | 2 refills | Status: DC

## 2017-05-01 NOTE — Progress Notes (Signed)
Subjective:     Patient ID: Tracey [name], female   DOB: [birth year], 81 y.o.   MRN: [id number]  HPI Reports increased pelvic pressure x 1 month. Increased urinary frequency and feeling like can't empty bladder completely. Concerned as this will be her 4th UTI this year if that is what it is.  Review of Systems Negative except stated above in HPI    Objective:   Physical Exam A&Ox4 Well groomed female in no distress Blood pressure (!) 148/58, pulse 68, weight 131 lb 8 oz (59.6 kg). Urinalysis    Component Value Date/Time   COLORURINE Yellow 03/09/2013 1516   APPEARANCEUR Clear 03/09/2013 1516   LABSPEC 1.021 03/09/2013 1516   PHURINE 5.0 03/09/2013 1516   GLUCOSEU Negative 03/09/2013 1516   HGBUR Negative 03/09/2013 1516   BILIRUBINUR neg 05/01/2017 0837   BILIRUBINUR Negative 03/09/2013 1516   KETONESUR Negative 03/09/2013 1516   PROTEINUR neg 05/01/2017 0837   PROTEINUR Negative 03/09/2013 1516   UROBILINOGEN 0.2 05/01/2017 0837   NITRITE positive 05/01/2017 0837   NITRITE Negative 03/09/2013 1516   LEUKOCYTESUR Large (3+) (A) 05/01/2017 0837   LEUKOCYTESUR Negative 03/09/2013 1516   Pelvic exam: normal external genitalia, vulva, vagina, cervix, uterus and adnexa, no cystocele noted.    Assessment:     UTI, recurrent     Plan:     Antibiotics prescribed. Recommend referral to urology and patient desires that. Urine sent for culture as last one was resistant to multiple antibiotics. RTC as needed.  Kalvyn Desa Ashaway, CNM

## 2017-05-02 ENCOUNTER — Telehealth: Payer: Self-pay | Admitting: Family Medicine

## 2017-05-02 ENCOUNTER — Other Ambulatory Visit (INDEPENDENT_AMBULATORY_CARE_PROVIDER_SITE_OTHER): Payer: Medicare HMO

## 2017-05-02 DIAGNOSIS — E7849 Other hyperlipidemia: Secondary | ICD-10-CM

## 2017-05-02 DIAGNOSIS — E119 Type 2 diabetes mellitus without complications: Secondary | ICD-10-CM | POA: Diagnosis not present

## 2017-05-02 DIAGNOSIS — E0800 Diabetes mellitus due to underlying condition with hyperosmolarity without nonketotic hyperglycemic-hyperosmolar coma (NKHHC): Secondary | ICD-10-CM

## 2017-05-02 DIAGNOSIS — I10 Essential (primary) hypertension: Secondary | ICD-10-CM

## 2017-05-02 DIAGNOSIS — E784 Other hyperlipidemia: Secondary | ICD-10-CM | POA: Diagnosis not present

## 2017-05-02 DIAGNOSIS — D649 Anemia, unspecified: Secondary | ICD-10-CM

## 2017-05-02 LAB — COMPREHENSIVE METABOLIC PANEL
ALBUMIN: 4 g/dL (ref 3.5–5.2)
ALK PHOS: 41 U/L (ref 39–117)
ALT: 11 U/L (ref 0–35)
AST: 18 U/L (ref 0–37)
BUN: 28 mg/dL — ABNORMAL HIGH (ref 6–23)
CO2: 28 meq/L (ref 19–32)
CREATININE: 1.15 mg/dL (ref 0.40–1.20)
Calcium: 10.4 mg/dL (ref 8.4–10.5)
Chloride: 104 mEq/L (ref 96–112)
GFR: 48.42 mL/min — AB (ref >60.00)
Glucose, Bld: 147 mg/dL — ABNORMAL HIGH (ref 70–99)
Potassium: 4.1 mEq/L (ref 3.5–5.1)
Sodium: 138 mEq/L (ref 135–145)
Total Bilirubin: 0.4 mg/dL (ref 0.2–1.2)
Total Protein: 6.2 g/dL (ref 6.0–8.3)

## 2017-05-02 LAB — HEMOGLOBIN A1C: Hgb A1c MFr Bld: 7.6 % — ABNORMAL HIGH (ref 4.6–6.5)

## 2017-05-02 LAB — LIPID PANEL
Cholesterol: 117 mg/dL (ref 0–200)
HDL: 35.4 mg/dL — ABNORMAL LOW (ref >39.00)
LDL Cholesterol: 61 mg/dL (ref 0–99)
NonHDL: 81.25
Total CHOL/HDL Ratio: 3
Triglycerides: 102 mg/dL (ref 0.0–149.0)
VLDL: 20.4 mg/dL (ref 0.0–40.0)

## 2017-05-02 NOTE — Telephone Encounter (Signed)
-----   Message from Ellamae Sia sent at 04/25/2017 11:44 AM EDT ----- Regarding: Lab orders for Friday, 8.17.18 Lab orders for a 3 month follow up appt.

## 2017-05-03 ENCOUNTER — Other Ambulatory Visit: Payer: Medicare HMO

## 2017-05-04 LAB — URINE CULTURE

## 2017-05-07 ENCOUNTER — Ambulatory Visit (INDEPENDENT_AMBULATORY_CARE_PROVIDER_SITE_OTHER): Payer: Medicare HMO | Admitting: Family Medicine

## 2017-05-07 ENCOUNTER — Encounter: Payer: Self-pay | Admitting: Family Medicine

## 2017-05-07 VITALS — BP 94/52 | HR 58 | Temp 97.9°F | Ht 63.0 in | Wt 133.8 lb

## 2017-05-07 DIAGNOSIS — E784 Other hyperlipidemia: Secondary | ICD-10-CM

## 2017-05-07 DIAGNOSIS — D649 Anemia, unspecified: Secondary | ICD-10-CM | POA: Diagnosis not present

## 2017-05-07 DIAGNOSIS — E0800 Diabetes mellitus due to underlying condition with hyperosmolarity without nonketotic hyperglycemic-hyperosmolar coma (NKHHC): Secondary | ICD-10-CM | POA: Diagnosis not present

## 2017-05-07 DIAGNOSIS — E7849 Other hyperlipidemia: Secondary | ICD-10-CM

## 2017-05-07 DIAGNOSIS — I10 Essential (primary) hypertension: Secondary | ICD-10-CM

## 2017-05-07 NOTE — Assessment & Plan Note (Signed)
Disc goals for lipids and reasons to control them Rev labs with pt Rev low sat fat diet in detail Controlled with statin therapy

## 2017-05-07 NOTE — Assessment & Plan Note (Signed)
bp is lower with inc in her metoprolol No symptoms/dizziness or otherwise Will watch bp and pulse  BP Readings from Last 1 Encounters:  05/07/17 (!) 94/52   F/u with cardiology as planned  Disc lifstyle change with low sodium diet and exercise  Labs reviewed

## 2017-05-07 NOTE — Patient Instructions (Addendum)
Call the cardiology office-you are due for follow up next month   bp and pulse are lower on full metoprolol- we will watch this  If you feel dizzy please let us know   For diabetes :  Try to get most of your carbohydrates from produce (with the exception of white potatoes)  Eat less bread/pasta/rice/snack foods/cereals/sweets and other items from the middle of the grocery store (processed carbs)   Don't forget to get your eye exam next month   Try to walk 30 minutes a day if you can   Schedule non fasting labs in 3 months   Follow up with me for annual visit in feb/march

## 2017-05-07 NOTE — Assessment & Plan Note (Signed)
Lab Results  Component Value Date   HGBA1C 7.6 (H) 05/02/2017   This is only slt improved Pt thinks she can improve food choices and exercise  Will re check 3 mo  Consider adding glipizide if not under 7 She will schedule her own eye exam

## 2017-05-07 NOTE — Assessment & Plan Note (Signed)
On iron  Rev path rep consistent with iron def  Neg stool cards Chronic  Re check cbc next labs

## 2017-05-07 NOTE — Progress Notes (Signed)
Subjective:    Patient ID: Tracey [name], female    DOB: [birth year], 81 y.o.   MRN: [id number]  HPI Here for f/u of chronic health problems  Recently treated for klebsiella uti on 8/15 by her gyn  Keflex  She wanted her to see a urologist for this and frequent urination  Poss not emptying bladder    Wt Readings from Last 3 Encounters:  05/07/17 133 lb 12 oz (60.7 kg)  05/01/17 131 lb 8 oz (59.6 kg)  02/12/17 134 lb 8 oz (61 kg)  appetite is pretty good  Exercise - trying to walk more than she was / weather has been a problem  Does go to the mall and walk  23.69 kg/m  bp is down - she started taking the whole metoprolol pill instead of 1/2  No cp or palpitations or headaches or edema  No side effects to medicines  BP Readings from Last 3 Encounters:  05/07/17 (!) 94/52  05/01/17 (!) 148/58  02/12/17 112/72     bp has been low recently - but does not feel bad  toprol xl  Lisinopril 20  Hx of CAD and renal artery stenosis   Pulse Readings from Last 3 Encounters:  05/07/17 (!) 58  05/01/17 68  02/12/17 77     Lab Results  Component Value Date   CREATININE 1.15 05/02/2017   BUN 28 (H) 05/02/2017   NA 138 05/02/2017   K 4.1 05/02/2017   CL 104 05/02/2017   CO2 28 05/02/2017   Lab Results  Component Value Date   ALT 11 05/02/2017   AST 18 05/02/2017   ALKPHOS 41 05/02/2017   BILITOT 0.4 05/02/2017   Her cr is improved   Diabetes Home sugar results - improved at home - her 2 h pp glucose readings are improved - 130s  (was in the 180s)  DM diet - she tries to stick to a diabetic diet (more convenience eating) (has had teaching)  Exercise -walking  She thinks she can work on it more  Symptoms-none  A1C last  Lab Results  Component Value Date   HGBA1C 7.6 (H) 05/02/2017  this is down from 7.9  No problems with medications  Renal protection- ace  Last eye exam  9/17  (she will schedule next month)  Glucose was 147 fasting    Hyperlipidemia Lab  Results  Component Value Date   CHOL 117 05/02/2017   CHOL 148 10/26/2016   CHOL 160 02/06/2016   Lab Results  Component Value Date   HDL 35.40 (L) 05/02/2017   HDL 40.10 10/26/2016   HDL 37.60 (L) 02/06/2016   Lab Results  Component Value Date   LDLCALC 61 05/02/2017   LDLCALC 82 10/26/2016   [place] 85 02/06/2016   Lab Results  Component Value Date   TRIG 102.0 05/02/2017   TRIG 129.0 10/26/2016   TRIG 186.0 (H) 02/06/2016   Lab Results  Component Value Date   CHOLHDL 3 05/02/2017   CHOLHDL 4 10/26/2016   CHOLHDL 4 02/06/2016   No results found for: LDLDIRECT Takes crestor 40  Has known CAD  B12 def Lab Results  Component Value Date   VITAMINB12 278 10/26/2016   Getting B12 shot today    Hx of chronic anemia  Lab Results  Component Value Date   WBC 4.7 02/12/2017   HGB 10.4 (L) 02/12/2017   HCT 32.0 (L) 02/12/2017   MCV 83.2 02/12/2017   PLT 210.0 02/12/2017  Path review consistent with iron def  Lab Results  Component Value Date   FERRITIN 13.4 02/12/2017  taking iron now    Patient Active Problem List   Diagnosis Date Noted  . Osteopenia 04/30/2017  . Anemia 02/12/2017  . Vitamin D deficiency 02/03/2017  . Post viral syndrome 02/01/2017  . Well woman exam 11/05/2016  . Vitamin B12 deficiency 11/05/2016  . Depression 04/19/2016  . Fatigue 02/06/2016  . Angina pectoris ([place]) 08/26/2013  . Hypertension 01/22/2012  . Coronary heart disease 01/22/2012  . S/P CABG x 3 01/22/2012  . Hyperlipidemia 01/22/2012  . Diabetes mellitus ([place]) 01/22/2012  . Renal artery stenosis ([place]) 01/22/2012   Past Medical History:  Diagnosis Date  . Anxiety   . Atrial fibrillation ([place])    history  . Coronary artery disease   . Diabetes mellitus    non insulin dependent  . GERD (gastroesophageal reflux disease)   . Heart murmur   . Hyperlipidemia   . Hypertension   . Peripheral vascular disease ([place])   . UTI (urinary tract infection)    Past  Surgical History:  Procedure Laterality Date  . APPENDECTOMY    . BREAST BIOPSY Left    20 years ago  . BREAST BIOPSY Right 2008  . BREAST EXCISIONAL BIOPSY     rt 2008 lt "20 years ago"  . CARDIAC CATHETERIZATION    . CATARACT EXTRACTION W/PHACO Left 08/15/2015   Procedure: CATARACT EXTRACTION PHACO AND INTRAOCULAR LENS PLACEMENT (IOC);  Surgeon: Estill Cotta, MD;  Location: [facility] ORS;  Service: Ophthalmology;  Laterality: Left;  [place] 01:28 AP% 26.4 CDE 35.73 fluid pack # [id number]  . COLONOSCOPY WITH PROPOFOL N/A 01/16/2016   Procedure: COLONOSCOPY WITH PROPOFOL;  Surgeon: Manya Silvas, MD;  Location: [facility] ENDOSCOPY;  Service: Endoscopy;  Laterality: N/A;  . CORONARY ANGIOPLASTY WITH STENT PLACEMENT    . CORONARY ARTERY BYPASS GRAFT     with LIMA  to the LAD, SVG to OM1, SVG to PDA  . ESOPHAGOGASTRODUODENOSCOPY (EGD) WITH PROPOFOL N/A 01/16/2016   Procedure: ESOPHAGOGASTRODUODENOSCOPY (EGD) WITH PROPOFOL;  Surgeon: Manya Silvas, MD;  Location: [facility] ENDOSCOPY;  Service: Endoscopy;  Laterality: N/A;  . RENAL ARTERY STENT     left, By Dr Hulda Humphrey  . REPLACEMENT TOTAL KNEE  03/25/2013   right  . TONSILLECTOMY     Social History  Substance Use Topics  . Smoking status: Never Smoker  . Smokeless tobacco: Never Used  . Alcohol use No   Family History  Problem Relation Age of Onset  . Heart disease Father   . Other Brother        open heart surgery  . Cancer Brother   . Other Brother        open heart surgery  . Other Brother        open heart surgery  . Hyperlipidemia Daughter   . Breast cancer Neg Hx    Allergies  Allergen Reactions  . Ciprofloxacin Other (See Comments)  . Codeine     Nausea   . Erythromycin Nausea And Vomiting  . Tramadol    Current Outpatient Prescriptions on File Prior to Visit  Medication Sig Dispense Refill  . aspirin 81 MG tablet Take 81 mg by mouth daily.     . cephALEXin (KEFLEX) 500 MG capsule Take 1 capsule (500 mg total) by mouth 2  (two) times daily. 28 capsule 2  . clopidogrel (PLAVIX) 75 MG tablet Take 1 tablet (75 mg total) by mouth  daily. 90 tablet 3  . cyanocobalamin (,VITAMIN B-12,) 1000 MCG/ML injection INJECT 1ML INTRAMUSCULARLY ONCE MONTHLY AS DIRECTED 1 mL 11  . glucose blood (ONE TOUCH TEST STRIPS) test strip USE AS DIRECTED TO CHECK BLOOD SUGAR ONCE DAILY E08.00 100 each 2  . lisinopril (PRINIVIL,ZESTRIL) 20 MG tablet Take 1 tablet (20 mg total) by mouth daily. 90 tablet 3  . metFORMIN (GLUCOPHAGE) 1000 MG tablet Take 1 tablet (1,000 mg total) by mouth 2 (two) times daily. 180 tablet 1  . metoprolol succinate (TOPROL-XL) 25 MG 24 hr tablet TAKE 1 TABLET BY MOUTH ONCE DAILY 90 tablet 0  . mirtazapine (REMERON) 30 MG tablet TAKE 1 TABLET BY MOUTH AT BEDTIME 30 tablet 11  . Multiple Vitamins-Minerals (MULTIVITAMIN WITH MINERALS) tablet Take 1 tablet by mouth daily.    [name] LANCETS 46F MISC USE AS INSTRUCTED TO TEST BLOOD SUGAR ONCE DAILY E08.00 100 each 2  . pantoprazole (PROTONIX) 40 MG tablet Take 1 tablet (40 mg total) by mouth daily. 90 tablet 2  . rosuvastatin (CRESTOR) 40 MG tablet TAKE 1 TABLET BY MOUTH EVERY DAY 90 tablet 2  . Vitamin D, Ergocalciferol, (DRISDOL) 50000 units CAPS capsule Take 1 capsule (50,000 Units total) by mouth every 7 (seven) days. 12 capsule 0  . nitroGLYCERIN (NITROSTAT) 0.4 MG SL tablet Place 1 tablet (0.4 mg total) under the tongue every 5 (five) minutes as needed for chest pain. 25 tablet 6   No current facility-administered medications on file prior to visit.     Review of Systems Review of Systems  Constitutional: Negative for fever, appetite change, fatigue and unexpected weight change.  Eyes: Negative for pain and visual disturbance.  Respiratory: Negative for cough and shortness of breath.   Cardiovascular: Negative for cp or palpitations    Gastrointestinal: Negative for nausea, diarrhea and constipation.  Genitourinary: Negative for urgency and frequency.    Skin: Negative for pallor or rash   Neurological: Negative for weakness, light-headedness, numbness and headaches.  Hematological: Negative for adenopathy. Does not bruise/bleed easily.  Psychiatric/Behavioral: Negative for dysphoric mood. The patient is not nervous/anxious.         Objective:   Physical Exam  Constitutional: She appears well-developed and well-nourished. No distress.  Well appearing   HENT:  Head: Normocephalic and atraumatic.  Mouth/Throat: Oropharynx is clear and moist.  Eyes: Pupils are equal, round, and reactive to light. Conjunctivae and EOM are normal.  Neck: Normal range of motion. Neck supple. No JVD present. Carotid bruit is not present. No thyromegaly present.  Cardiovascular: Normal rate, regular rhythm, normal heart sounds and intact distal pulses.  Exam reveals no gallop.   Pulmonary/Chest: Effort normal and breath sounds normal. No respiratory distress. She has no wheezes. She has no rales.  No crackles  Abdominal: Soft. Bowel sounds are normal. She exhibits no distension, no abdominal bruit and no mass. There is no tenderness.  No suprapubic tenderness or fullness   No cva tenderness   Musculoskeletal: She exhibits no edema.  Lymphadenopathy:    She has no cervical adenopathy.  Neurological: She is alert. She has normal reflexes. No cranial nerve deficit. She exhibits normal muscle tone. Coordination normal.  Skin: Skin is warm and dry. No rash noted. No pallor.  Psychiatric: She has a normal mood and affect.          Assessment & Plan:   Problem List Items Addressed This Visit      Cardiovascular and Mediastinum   Hypertension -  Primary    bp is lower with inc in her metoprolol No symptoms/dizziness or otherwise Will watch bp and pulse  BP Readings from Last 1 Encounters:  05/07/17 (!) 94/52   F/u with cardiology as planned  Disc lifstyle change with low sodium diet and exercise  Labs reviewed          Endocrine   Diabetes  mellitus ([place])    Lab Results  Component Value Date   HGBA1C 7.6 (H) 05/02/2017   This is only slt improved Pt thinks she can improve food choices and exercise  Will re check 3 mo  Consider adding glipizide if not under 7 She will schedule her own eye exam        Other   Anemia    On iron  Rev path rep consistent with iron def  Neg stool cards Chronic  Re check cbc next labs       Hyperlipidemia    Disc goals for lipids and reasons to control them Rev labs with pt Rev low sat fat diet in detail Controlled with statin therapy

## 2017-05-09 ENCOUNTER — Other Ambulatory Visit (INDEPENDENT_AMBULATORY_CARE_PROVIDER_SITE_OTHER): Payer: Medicare HMO

## 2017-05-09 DIAGNOSIS — Z951 Presence of aortocoronary bypass graft: Secondary | ICD-10-CM | POA: Diagnosis not present

## 2017-05-09 LAB — FECAL OCCULT BLOOD, IMMUNOCHEMICAL: Fecal Occult Bld: NEGATIVE

## 2017-05-09 NOTE — Progress Notes (Signed)
Nl lab

## 2017-05-21 NOTE — Progress Notes (Signed)
05/22/2017 10:59 AM   Tracey Tracey [name] 12-13-1935 [id number]  Referring provider: Abner Greenspan, MD [address] [place], [place] [address]  Chief Complaint  Patient presents with  . New Patient (Initial Visit)    Recurrent uti / dysuria referred by Dr. Loura Tracey [name]    HPI: Patient is a 81 -year-old Caucasian female who is referred to [place] by, Dr. Abner Tracey [name], for recurrent urinary tract infections.  Patient states that she has had 3 to 4 urinary tract infections over the last year.  Reviewing Tracey [name] records,  she has had two documented UTI's for E. Coli and Klebsiella.    Tracey [name] symptoms with a urinary tract infection consist of bubbles in the urine, foul odor and cloudiness.  She denies dysuria, gross hematuria, suprapubic pain, back pain, abdominal pain or flank pain at this time.  She has not had any recent fevers, chills, nausea or vomiting.   She has a remote history of kidney stone.  She does not have a history of nephrolithiasis, GU surgery or GU trauma.   She is not sexually active.  She is postmenopausal.  She admits to diarrhea.  She does engage in good perineal hygiene. She does not take tub baths.   She has incontinence which she describes as mild  She is using panty liners at this time.   She has not had any recent imaging studies.    Tracey [name] baseline urinary symptoms consist of frequency x 2 years, urgency x 6 months, nocturia  and a weak urinary stream.  Tracey [name] PVR is 11 mL.    She is drinking four 16 ounce bottles of water daily.  She has 1 to 2 cups of coffee daily.     Reviewed referral notes.    PMH: Past Medical History:  Diagnosis Date  . Anxiety   . Atrial fibrillation ([place])    history  . Coronary artery disease   . Diabetes mellitus    non insulin dependent  . GERD (gastroesophageal reflux disease)   . Heart murmur   . Hyperlipidemia   . Hypertension   . Peripheral vascular disease ([place])   . UTI (urinary tract infection)     Surgical  History: Past Surgical History:  Procedure Laterality Date  . APPENDECTOMY    . BREAST BIOPSY Left    20 years ago  . BREAST BIOPSY Right 2008  . BREAST EXCISIONAL BIOPSY     rt 2008 lt "20 years ago"  . CARDIAC CATHETERIZATION    . CATARACT EXTRACTION W/PHACO Left 08/15/2015   Procedure: CATARACT EXTRACTION PHACO AND INTRAOCULAR LENS PLACEMENT (IOC);  Surgeon: Tracey Cotta, MD;  Location: [facility] ORS;  Service: Ophthalmology;  Laterality: Left;  [place] 01:28 AP% 26.4 CDE 35.73 fluid pack # [id number]  . COLONOSCOPY WITH PROPOFOL N/A 01/16/2016   Procedure: COLONOSCOPY WITH PROPOFOL;  Surgeon: Tracey Silvas, MD;  Location: [facility] ENDOSCOPY;  Service: Endoscopy;  Laterality: N/A;  . CORONARY ANGIOPLASTY WITH STENT PLACEMENT    . CORONARY ARTERY BYPASS GRAFT     with LIMA  to the LAD, SVG to OM1, SVG to PDA  . ESOPHAGOGASTRODUODENOSCOPY (EGD) WITH PROPOFOL N/A 01/16/2016   Procedure: ESOPHAGOGASTRODUODENOSCOPY (EGD) WITH PROPOFOL;  Surgeon: Tracey Silvas, MD;  [facility] ENDOSCOPY;  Service: Endoscopy;  Laterality: N/A;  . RENAL ARTERY STENT     left, By Dr Tracey Tracey [name]  . REPLACEMENT TOTAL KNEE  03/25/2013   right  . TONSILLECTOMY      Home  Medications:  Allergies as of 05/22/2017      Reactions   Ciprofloxacin Other (See Comments)   Codeine    Nausea    Erythromycin Nausea And Vomiting   Tramadol       Medication List       Accurate as of 05/22/17 10:59 AM. Always use your most recent med list.          aspirin 81 MG tablet Take 81 mg by mouth daily.   cephALEXin 500 MG capsule Commonly known as:  KEFLEX Take 1 capsule (500 mg total) by mouth 2 (two) times daily.   clopidogrel 75 MG tablet Commonly known as:  PLAVIX Take 1 tablet (75 mg total) by mouth daily.   conjugated estrogens vaginal cream Commonly known as:  PREMARIN Place 1 Applicatorful vaginally daily. Apply 0.44m (pea-sized amount)  just inside the vaginal introitus with a finger-tip every night for two weeks  and then Monday, Wednesday and Friday nights.   cyanocobalamin 1000 MCG/ML injection Commonly known as:  (VITAMIN B-12) INJECT 1ML INTRAMUSCULARLY ONCE MONTHLY AS DIRECTED   glucose blood test strip Commonly known as:  ONE TOUCH TEST STRIPS USE AS DIRECTED TO CHECK BLOOD SUGAR ONCE DAILY E08.00   lisinopril 20 MG tablet Commonly known as:  PRINIVIL,ZESTRIL Take 1 tablet (20 mg total) by mouth daily.   metFORMIN 1000 MG tablet Commonly known as:  GLUCOPHAGE Take 1 tablet (1,000 mg total) by mouth 2 (two) times daily.   metoprolol succinate 25 MG 24 hr tablet Commonly known as:  TOPROL-XL TAKE 1 TABLET BY MOUTH ONCE DAILY   mirabegron ER 25 MG Tb24 tablet Commonly known as:  MYRBETRIQ Take 1 tablet (25 mg total) by mouth daily.   mirtazapine 30 MG tablet Commonly known as:  REMERON TAKE 1 TABLET BY MOUTH AT BEDTIME   multivitamin with minerals tablet Take 1 tablet by mouth daily.   nitroGLYCERIN 0.4 MG SL tablet Commonly known as:  NITROSTAT Place 1 tablet (0.4 mg total) under the tongue every 5 (five) minutes as needed for chest pain.   ONETOUCH DELICA LANCETS 340JMisc USE AS INSTRUCTED TO TEST BLOOD SUGAR ONCE DAILY E08.00   pantoprazole 40 MG tablet Commonly known as:  PROTONIX Take 1 tablet (40 mg total) by mouth daily.   rosuvastatin 40 MG tablet Commonly known as:  CRESTOR TAKE 1 TABLET BY MOUTH EVERY DAY   Vitamin D (Ergocalciferol) 50000 units Caps capsule Commonly known as:  DRISDOL Take 1 capsule (50,000 Units total) by mouth every 7 (seven) days.            Discharge Care Instructions        Start     Ordered   05/22/17 0000  BLADDER SCAN AMB NON-IMAGING     05/22/17 1008   05/22/17 0000  conjugated estrogens (PREMARIN) vaginal cream  Daily    Question:  Supervising Provider  Answer:  BHollice [name]  05/22/17 1058   05/22/17 0000  mirabegron ER (MYRBETRIQ) 25 MG TB24 tablet  Daily    Question:  Supervising Provider  Answer:  BHollice [name]  05/22/17 1058      Allergies:  Allergies  Allergen Reactions  . Ciprofloxacin Other (See Comments)  . Codeine     Nausea   . Erythromycin Nausea And Vomiting  . Tramadol     Family History: Family History  Problem Relation Age of Onset  . Heart disease Father   . Other Brother  open heart surgery  . Cancer Brother   . Other Brother        open heart surgery  . Other Brother        open heart surgery  . Hyperlipidemia Daughter   . Breast cancer Neg Hx   . Kidney cancer Neg Hx   . Bladder Cancer Neg Hx     Social History:  reports that she has never smoked. She has never used smokeless tobacco. She reports that she does not drink alcohol or use drugs.  ROS: UROLOGY Frequent Urination?: Yes Hard to postpone urination?: Yes Burning/pain with urination?: No Get up at night to urinate?: Yes Leakage of urine?: No Urine stream starts and stops?: No Trouble starting stream?: No Do you have to strain to urinate?: No Blood in urine?: No Urinary tract infection?: Yes Sexually transmitted disease?: No Injury to kidneys or bladder?: No Painful intercourse?: No Weak stream?: Yes Currently pregnant?: No Vaginal bleeding?: No Last menstrual period?: n  Gastrointestinal Nausea?: No Vomiting?: No Indigestion/heartburn?: No Diarrhea?: Yes Constipation?: No  Constitutional Fever: No Night sweats?: No Weight loss?: No Fatigue?: Yes  Skin Skin rash/lesions?: No Itching?: No  Eyes Blurred vision?: No Double vision?: No  Ears/Nose/Throat Sore throat?: No Sinus problems?: No  Hematologic/Lymphatic Swollen glands?: No Easy bruising?: No  Cardiovascular Leg swelling?: No Chest pain?: No  Respiratory Cough?: No Shortness of breath?: No  Endocrine Excessive thirst?: No  Musculoskeletal Back pain?: Yes Joint pain?: No  Neurological Headaches?: No Dizziness?: No  Psychologic Depression?: Yes Anxiety?: No  Physical Exam: BP  131/64   Pulse 64   Ht 5' 3"  (1.6 m)   Wt 131 lb 14.4 oz (59.8 kg)   BMI 23.37 kg/m   Constitutional: Well nourished. Alert and oriented, No acute distress. HEENT: [place] AT, moist mucus membranes. Trachea midline, no masses. Cardiovascular: No clubbing, cyanosis, or edema. Respiratory: Normal respiratory effort, no increased work of breathing. GI: Abdomen is soft, non tender, non distended, no abdominal masses. Liver and spleen not palpable.  No hernias appreciated.  Stool sample for occult testing is not indicated.   GU: No CVA tenderness.  No bladder fullness or masses.  Atrophic external genitalia, normal pubic hair distribution, no lesions.  Normal urethral meatus, no lesions, no prolapse, no discharge.   No urethral masses, tenderness and/or tenderness.  Urethral hypermobility noted. No bladder fullness, tenderness or masses. Pale vagina mucosa, poor estrogen effect, no discharge, no lesions, good pelvic support, no cystocele or rectocele noted.  No cervical motion tenderness.  Uterus is freely mobile and non-fixed.  No adnexal/parametria masses or tenderness noted.  Anus and perineum are without rashes or lesions.    Skin: No rashes, bruises or suspicious lesions. Lymph: No cervical or inguinal adenopathy. Neurologic: Grossly intact, no focal deficits, moving all 4 extremities. Psychiatric: Normal mood and affect.  Laboratory Data: Lab Results  Component Value Date   WBC 4.7 02/12/2017   HGB 10.4 (L) 02/12/2017   HCT 32.0 (L) 02/12/2017   MCV 83.2 02/12/2017   PLT 210.0 02/12/2017    Lab Results  Component Value Date   CREATININE 1.15 05/02/2017    Lab Results  Component Value Date   HGBA1C 7.6 (H) 05/02/2017    Lab Results  Component Value Date   TSH 1.24 02/01/2017       Component Value Date/Time   CHOL 117 05/02/2017 1139   CHOL 141 10/08/2011 0457   HDL 35.40 (L) 05/02/2017 1139   HDL 37 (L)  10/08/2011 0457   CHOLHDL 3 05/02/2017 1139   VLDL 20.4 05/02/2017  1139   VLDL 36 10/08/2011 0457   LDLCALC 61 05/02/2017 1139   LDLCALC 68 10/08/2011 0457    Lab Results  Component Value Date   AST 18 05/02/2017   Lab Results  Component Value Date   ALT 11 05/02/2017    Pertinent Imaging: Results for TRENISE, TURAY (MRN [id number]) as of 05/22/2017 10:34  Ref. Range 05/22/2017 10:30  Scan Result Unknown 11   I have independently reviewed the films.    Assessment & Plan:    1. Recurrent UTI  - criteria for recurrent UTI has been met with 2 or more infections in 6 months or 3 or greater infections in one year   - probiotics (yogurt, oral pills or vaginal suppositories), take cranberry pills or drink the juice and Vitamin C 1,000 mg daily to acidify the urine should be added to their daily regimen   - advised them to have CATH UA's for urinalysis and culture to prevent skin contamination of the specimen  - reviewed symptoms of UTI and advised not to have urine checked or be treated for UTI if not experiencing symptoms  - discussed antibiotic stewardship with the patient    2. Vaginal Atrophy  - I explained to the patient that when women go through menopause and Tracey [name] estrogen levels are severely diminished, the normal vaginal flora will change.  This is due to an increase of the vaginal canal's pH. Because of this, the vaginal canal may be colonized by bacteria from the rectum instead of the protective lactobacillus.  This accompanied by the loss of the mucus barrier with vaginal atrophy is a cause of recurrent urinary tract infections.  - In some studies, the use of vaginal estrogen cream has been demonstrated to reduce  recurrent urinary tract infections to one a year.   - Patient was given a sample of vaginal estrogen cream (Premarin) and instructed to apply 0.70m (pea-sized amount)  just inside the vaginal introitus with a finger-tip every night for two weeks and then Monday, Wednesday and Friday nights.  I explained to the patient that vaginally  administered estrogen, which causes only a slight increase in the blood estrogen levels, have fewer contraindications and adverse systemic effects that oral HT.  - She will follow up in three months for an exam.    3. Urgency  - offered medical therapy with a beta-3 adrenergic receptor agonist - would like to try the beta-3 adrenergic receptor agonist (Myrbetriq).  Given Myrbetriq 25 mg samples, #28.  I have reviewed with the patient of the side effects of Myrbetriq, such as: elevation in BP, urinary retention and/or HA.    - RTC in 3 weeks for PVR and an OAB questionnaire                               Return in about 3 weeks (around 06/12/2017) for PVR and OAB questionnaire.  These notes generated with voice recognition software. I apologize for typographical errors.  SZara [facility] [address] [place] [phone]

## 2017-05-22 ENCOUNTER — Ambulatory Visit (INDEPENDENT_AMBULATORY_CARE_PROVIDER_SITE_OTHER): Payer: Medicare HMO | Admitting: Urology

## 2017-05-22 ENCOUNTER — Encounter: Payer: Self-pay | Admitting: Urology

## 2017-05-22 VITALS — BP 131/64 | HR 64 | Ht 63.0 in | Wt 131.9 lb

## 2017-05-22 DIAGNOSIS — N390 Urinary tract infection, site not specified: Secondary | ICD-10-CM

## 2017-05-22 DIAGNOSIS — R3915 Urgency of urination: Secondary | ICD-10-CM

## 2017-05-22 DIAGNOSIS — N952 Postmenopausal atrophic vaginitis: Secondary | ICD-10-CM

## 2017-05-22 LAB — BLADDER SCAN AMB NON-IMAGING: Scan Result: 11

## 2017-05-22 MED ORDER — MIRABEGRON ER 25 MG PO TB24: 25.0000 mg | ORAL_TABLET | Freq: Every day | ORAL | 12 refills | Status: DC

## 2017-05-22 MED ORDER — ESTROGENS, CONJUGATED 0.625 MG/GM VA CREA: 1.0000 | TOPICAL_CREAM | Freq: Every day | VAGINAL | 12 refills | Status: DC

## 2017-05-22 NOTE — Patient Instructions (Signed)

## 2017-05-23 ENCOUNTER — Other Ambulatory Visit: Payer: Self-pay | Admitting: Family Medicine

## 2017-05-24 DIAGNOSIS — R69 Illness, unspecified: Secondary | ICD-10-CM | POA: Diagnosis not present

## 2017-06-11 NOTE — Progress Notes (Signed)
  06/12/2017 10:50 AM   Tracey [name] 02/27/1936 [id number]  Referring provider: Tower, Marne A, MD [address] [place], [place] [address]  No chief complaint on file.   HPI: 80 yo WF with recurrent UTI's, vaginal atrophy and urgency who presents today for a 3 week follow after a trial Myrbetriq 25 mg daily.    Background history. Patient is a 80 -year-old Caucasian female who is referred to us by, Dr. Marne A Tower, for recurrent urinary tract infections.  Patient states that she has had 3 to 4 urinary tract infections over the last year.  Reviewing her records,  she has had two documented UTI's for E. Coli and Klebsiella.  Her symptoms with a urinary tract infection consist of bubbles in the urine, foul odor and cloudiness.  She denies dysuria, gross hematuria, suprapubic pain, back pain, abdominal pain or flank pain at this time.  She has not had any recent fevers, chills, nausea or vomiting.  She has a remote history of kidney stone.  She does not have a history of nephrolithiasis, GU surgery or GU trauma.   She is not sexually active.  She is postmenopausal.  She admits to diarrhea.  She does engage in good perineal hygiene. She does not take tub baths.   She has incontinence which she describes as mild  She is using panty liners at this time.  She has not had any recent imaging studies.   Her baseline urinary symptoms consist of frequency x 2 years, urgency x 6 months, nocturia  and a weak urinary stream.  Her PVR is 11 mL.  She is drinking four 16 ounce bottles of water daily.  She has 1 to 2 cups of coffee daily.    Today, she has been experiencing urgency x 4-7, frequency x 4-7, not restricting fluids to avoid visits to the restroom, is engaging in toilet mapping, incontinence x 0-3 and nocturia x 0-3.   Her PVR is 13 mL.  Her BP is 145/67.  She did not find the Myrbetriq effective.    She is applying the vaginal estrogen cream three nights weekly, but she will not fill the  prescription as it is cost prohibitive.    She has not had an UTI since her last visit with us, but today she feels she may have a UTI.   She is having suprapubic pain and an awful odor for the last week.  UA today > 30 WBC's, many bacteria and nitrite positive.  She is not having dysuria or gross hematuria.  She is not having fevers, chills, nausea or vomiting.                                    PMH: Past Medical History:  Diagnosis Date  . Anxiety   . Atrial fibrillation ([facility])    history  . Coronary artery disease   . Diabetes mellitus    non insulin dependent  . GERD (gastroesophageal reflux disease)   . Heart murmur   . Hyperlipidemia   . Hypertension   . Peripheral vascular disease ([facility])   . UTI (urinary tract infection)     Surgical History: Past Surgical History:  Procedure Laterality Date  . APPENDECTOMY    . BREAST BIOPSY Left    20 years ago  . BREAST BIOPSY Right 2008  . BREAST EXCISIONAL BIOPSY     rt 2008   lt "20 years ago"  . CARDIAC CATHETERIZATION    . CATARACT EXTRACTION W/PHACO Left 08/15/2015   Procedure: CATARACT EXTRACTION PHACO AND INTRAOCULAR LENS PLACEMENT (IOC);  Surgeon: Steven Dingeldein, MD;  Location: [facility] ORS;  Service: Ophthalmology;  Laterality: Left;  [place] 01:28 AP% 26.4 CDE 35.73 fluid pack # [id number]  . COLONOSCOPY WITH PROPOFOL N/A 01/16/2016   Procedure: COLONOSCOPY WITH PROPOFOL;  Surgeon: Robert T Elliott, MD;  Location: [facility] ENDOSCOPY;  Service: Endoscopy;  Laterality: N/A;  . CORONARY ANGIOPLASTY WITH STENT PLACEMENT    . CORONARY ARTERY BYPASS GRAFT     with LIMA  to the LAD, SVG to OM1, SVG to PDA  . ESOPHAGOGASTRODUODENOSCOPY (EGD) WITH PROPOFOL N/A 01/16/2016   Procedure: ESOPHAGOGASTRODUODENOSCOPY (EGD) WITH PROPOFOL;  Surgeon: Robert T Elliott, MD;  Location: [facility] ENDOSCOPY;  Service: Endoscopy;  Laterality: N/A;  . RENAL ARTERY STENT     left, By Dr Hearn  . REPLACEMENT TOTAL KNEE  03/25/2013   right  . TONSILLECTOMY      Home  Medications:  Allergies as of 06/12/2017      Reactions   Ciprofloxacin Other (See Comments)   Codeine    Nausea    Erythromycin Nausea And Vomiting   Tramadol       Medication List       Accurate as of 06/12/17 10:50 AM. Always use your most recent med list.          amoxicillin-clavulanate 875-125 MG tablet Commonly known as:  AUGMENTIN Take 1 tablet by mouth every 12 (twelve) hours.   aspirin 81 MG tablet Take 81 mg by mouth daily.   clopidogrel 75 MG tablet Commonly known as:  PLAVIX Take 1 tablet (75 mg total) by mouth daily.   conjugated estrogens vaginal cream Commonly known as:  PREMARIN Place 1 Applicatorful vaginally daily. Apply 0.5mg (pea-sized amount)  just inside the vaginal introitus with a finger-tip every night for two weeks and then Monday, Wednesday and Friday nights.   cyanocobalamin 1000 MCG/ML injection Commonly known as:  (VITAMIN B-12) INJECT 1ML INTRAMUSCULARLY ONCE MONTHLY AS DIRECTED   fesoterodine 8 MG Tb24 tablet Commonly known as:  TOVIAZ Take 1 tablet (8 mg total) by mouth daily.   glucose blood test strip Commonly known as:  ONE TOUCH TEST STRIPS USE AS DIRECTED TO CHECK BLOOD SUGAR ONCE DAILY E08.00   lisinopril 20 MG tablet Commonly known as:  PRINIVIL,ZESTRIL TAKE 1 TABLET BY MOUTH ONCE DAILY   metFORMIN 1000 MG tablet Commonly known as:  GLUCOPHAGE Take 1 tablet (1,000 mg total) by mouth 2 (two) times daily.   metoprolol succinate 25 MG 24 hr tablet Commonly known as:  TOPROL-XL TAKE 1 TABLET BY MOUTH ONCE DAILY   mirabegron ER 25 MG Tb24 tablet Commonly known as:  MYRBETRIQ Take 1 tablet (25 mg total) by mouth daily.   mirtazapine 30 MG tablet Commonly known as:  REMERON TAKE 1 TABLET BY MOUTH AT BEDTIME   multivitamin with minerals tablet Take 1 tablet by mouth daily.   nitroGLYCERIN 0.4 MG SL tablet Commonly known as:  NITROSTAT Place 1 tablet (0.4 mg total) under the tongue every 5 (five) minutes as needed for  chest pain.   ONETOUCH DELICA LANCETS 33G Misc USE AS INSTRUCTED TO TEST BLOOD SUGAR ONCE DAILY E08.00   pantoprazole 40 MG tablet Commonly known as:  PROTONIX Take 1 tablet (40 mg total) by mouth daily.   rosuvastatin 40 MG tablet Commonly known as:  CRESTOR TAKE 1 TABLET   BY MOUTH EVERY DAY   Vitamin D (Ergocalciferol) 50000 units Caps capsule Commonly known as:  DRISDOL Take 1 capsule (50,000 Units total) by mouth every 7 (seven) days.            Discharge Care Instructions        Start     Ordered   06/12/17 0000  Urinalysis, Complete     06/12/17 1003   06/12/17 0000  Bladder Scan (Post Void Residual) in office     06/12/17 1003   06/12/17 0000  CULTURE, URINE COMPREHENSIVE     06/12/17 1044   06/12/17 0000  amoxicillin-clavulanate (AUGMENTIN) 875-125 MG tablet  Every 12 hours    Question:  Supervising Provider  Answer:  Hollice Espy   06/12/17 1046   06/12/17 0000  fesoterodine (TOVIAZ) 8 MG TB24 tablet  Daily    Question:  Supervising Provider  Answer:  Hollice Espy   06/12/17 1047      Allergies:  Allergies  Allergen Reactions  . Ciprofloxacin Other (See Comments)  . Codeine     Nausea   . Erythromycin Nausea And Vomiting  . Tramadol     Family History: Family History  Problem Relation Age of Onset  . Heart disease Father   . Other Brother        open heart surgery  . Cancer Brother   . Other Brother        open heart surgery  . Other Brother        open heart surgery  . Hyperlipidemia Daughter   . Breast cancer Neg Hx   . Kidney cancer Neg Hx   . Bladder Cancer Neg Hx     Social History:  reports that she has never smoked. She has never used smokeless tobacco. She reports that she does not drink alcohol or use drugs.  ROS: UROLOGY Frequent Urination?: Yes Hard to postpone urination?: Yes Burning/pain with urination?: No Get up at night to urinate?: No Leakage of urine?: Yes Urine stream starts and stops?: No Trouble starting  stream?: No Do you have to strain to urinate?: No Blood in urine?: No Urinary tract infection?: Yes Sexually transmitted disease?: No Injury to kidneys or bladder?: No Painful intercourse?: No Weak stream?: No Currently pregnant?: No Vaginal bleeding?: No Last menstrual period?: n  Gastrointestinal Nausea?: No Vomiting?: No Indigestion/heartburn?: No Diarrhea?: No Constipation?: No  Constitutional Fever: No Night sweats?: No Weight loss?: No Fatigue?: No  Skin Skin rash/lesions?: No Itching?: No  Eyes Blurred vision?: No Double vision?: No  Ears/Nose/Throat Sore throat?: No Sinus problems?: No  Hematologic/Lymphatic Swollen glands?: No Easy bruising?: No  Cardiovascular Leg swelling?: No Chest pain?: No  Respiratory Cough?: No Shortness of breath?: No  Endocrine Excessive thirst?: No  Musculoskeletal Back pain?: Yes Joint pain?: No  Neurological Headaches?: No Dizziness?: No  Psychologic Depression?: No Anxiety?: No  Physical Exam: BP (!) 145/67 (BP Location: Left Arm, Patient Position: Sitting, Cuff Size: Normal)   Pulse 73   Ht 5' 3" (1.6 m)   Wt 130 lb 1.6 oz (59 kg)   BMI 23.05 kg/m   Constitutional: Well nourished. Alert and oriented, No acute distress. HEENT: [place] AT, moist mucus membranes. Trachea midline, no masses. Cardiovascular: No clubbing, cyanosis, or edema. Respiratory: Normal respiratory effort, no increased work of breathing. Skin: No rashes, bruises or suspicious lesions. Lymph: No cervical or inguinal adenopathy. Neurologic: Grossly intact, no focal deficits, moving all 4 extremities. Psychiatric: Normal mood and affect.  Laboratory  Data: Lab Results  Component Value Date   WBC 4.7 02/12/2017   HGB 10.4 (L) 02/12/2017   HCT 32.0 (L) 02/12/2017   MCV 83.2 02/12/2017   PLT 210.0 02/12/2017    Lab Results  Component Value Date   CREATININE 1.15 05/02/2017    Lab Results  Component Value Date   HGBA1C 7.6  (H) 05/02/2017    Lab Results  Component Value Date   TSH 1.24 02/01/2017       Component Value Date/Time   CHOL 117 05/02/2017 1139   CHOL 141 10/08/2011 0457   HDL 35.40 (L) 05/02/2017 1139   HDL 37 (L) 10/08/2011 0457   CHOLHDL 3 05/02/2017 1139   VLDL 20.4 05/02/2017 1139   VLDL 36 10/08/2011 0457   LDLCALC 61 05/02/2017 1139   LDLCALC 68 10/08/2011 0457    Lab Results  Component Value Date   AST 18 05/02/2017   Lab Results  Component Value Date   ALT 11 05/02/2017   I have reviewed the labs  Pertinent Imaging: Results for [name], Roxine M (MRN [id number]) as of 06/12/2017 10:33  Ref. Range 06/12/2017 10:07  Scan Result Unknown 13   Assessment & Plan:    1. Recurrent UTI  - criteria for recurrent UTI has been met with 2 or more infections in 6 months or 3 or greater infections in one year   - UA suspicious for culture - will send for culture  - start antibiotic at this time, Augmentin 875/125, one tablet x 14 - will adjust if necessary once culture results are available   2. Vaginal Atrophy  - cream was cost prohibitive    3. Urgency  - failed Myrbetriq  - offered medical therapy with an anticholinergic - Toviaz 8 mg daily, # 28 samples given -  advised of the side effects of such as: Dry eyes, dry mouth, constipation, mental confusion and/or urinary retention.  - RTC in 3 weeks for PVR and an OAB questionnaire                            Return in about 3 weeks (around 07/03/2017) for PVR and OAB questionnaire.  These notes generated with voice recognition software. I apologize for typographical errors.  SHANNON MCGOWAN, PA-C  [facility] [address], [address] [place],  [address] [phone]  

## 2017-06-12 ENCOUNTER — Encounter: Payer: Self-pay | Admitting: Urology

## 2017-06-12 ENCOUNTER — Ambulatory Visit: Payer: Medicare HMO | Admitting: Urology

## 2017-06-12 VITALS — BP 145/67 | HR 73 | Ht 63.0 in | Wt 130.1 lb

## 2017-06-12 DIAGNOSIS — N390 Urinary tract infection, site not specified: Secondary | ICD-10-CM

## 2017-06-12 DIAGNOSIS — R3915 Urgency of urination: Secondary | ICD-10-CM | POA: Diagnosis not present

## 2017-06-12 DIAGNOSIS — N952 Postmenopausal atrophic vaginitis: Secondary | ICD-10-CM

## 2017-06-12 LAB — MICROSCOPIC EXAMINATION: RBC, UA: NONE SEEN /hpf (ref 0–?)

## 2017-06-12 LAB — URINALYSIS, COMPLETE
BILIRUBIN UA: NEGATIVE
Glucose, UA: NEGATIVE
NITRITE UA: POSITIVE — AB
Urobilinogen, Ur: 0.2 mg/dL (ref 0.2–1.0)
pH, UA: 5.5 (ref 5.0–7.5)

## 2017-06-12 LAB — BLADDER SCAN AMB NON-IMAGING: SCAN RESULT: 13

## 2017-06-12 MED ORDER — FESOTERODINE FUMARATE ER 8 MG PO TB24: 8.0000 mg | ORAL_TABLET | Freq: Every day | ORAL | 0 refills | Status: DC

## 2017-06-12 MED ORDER — AMOXICILLIN-POT CLAVULANATE 875-125 MG PO TABS: 1.0000 | ORAL_TABLET | Freq: Two times a day (BID) | ORAL | 0 refills | Status: DC

## 2017-06-12 NOTE — Addendum Note (Signed)
Addended by: Kyra Manges on: 06/12/2017 11:06 AM   Modules accepted: Orders

## 2017-06-18 ENCOUNTER — Telehealth: Payer: Self-pay

## 2017-06-18 LAB — CULTURE, URINE COMPREHENSIVE

## 2017-06-18 NOTE — Telephone Encounter (Signed)
Patient notified

## 2017-06-18 NOTE — Telephone Encounter (Signed)
-----   Message from Nori Riis, PA-C sent at 06/18/2017  8:36 AM EDT ----- Please let [name] know that her urine culture is positive and the Augmentin is the right antibiotic.

## 2017-07-04 ENCOUNTER — Ambulatory Visit: Payer: Medicare HMO | Admitting: Urology

## 2017-07-09 ENCOUNTER — Encounter: Payer: Self-pay | Admitting: Cardiovascular Disease

## 2017-07-09 ENCOUNTER — Ambulatory Visit (INDEPENDENT_AMBULATORY_CARE_PROVIDER_SITE_OTHER): Payer: Medicare HMO | Admitting: Cardiovascular Disease

## 2017-07-09 VITALS — BP 122/60 | HR 57 | Ht 63.0 in | Wt 131.8 lb

## 2017-07-09 DIAGNOSIS — R079 Chest pain, unspecified: Secondary | ICD-10-CM

## 2017-07-09 DIAGNOSIS — R0602 Shortness of breath: Secondary | ICD-10-CM | POA: Diagnosis not present

## 2017-07-09 NOTE — Progress Notes (Signed)
Cardiology Office Note  Date:  07/09/2017   ID:  Tracey [name], DOB 07-23-1936, MRN [id number]  PCP:  Abner Greenspan, MD   Chief Complaint  Patient presents with  . other    6 month follow up. Meds reviewed by the pt. verbally. Pt. c/o shortness of breath with dealing with frequent UTI's.     HPI:  [name] is a very pleasant 81 year-old woman with a history of coronary artery disease, bypass surgery in 1995,  diabetes with hemoglobin A1c 7.0,  stent to her graft to the OM several years ago with chest pain January 2013  repeat stenting to the graft to the OM with a Xience 3.5 x 18 mm stent (patient and husband report that she required delivery of a shock for resuscitation during the procedure),  left renal PCI in 1998, redo PCI March 2010 followed by [name],  who presents for routine followup of her coronary artery disease  She lost her husband in 2016  Frequent UTI, better now,  SOB with exertion Some chest tightness on exertion, does not know if it is cardiac but she is concerned Continues to have some fluttering in her chest when she exerts herself Has to sit down to recover  Eating less, Not sleeping well She is helping to take care of her grandson who is 69 years old with alcohol addiction in and out of rehab  Lab work reviewed with her in detail Total chol 117, LDL 61 HBA1C 7.6 Tolerating metformin  EKG personally reviewed by myself on todays visit Normal sinus rhythm with APC, T wave abnormality V1 through V3 No significant change  Past medical history Previous spells of weakness and fainting Previous 30 day monitor ordered showing no significant arrhythmia, normal sinus rhythm She was having episodes of diarrhea  Prior lab work reviewed with her showing total cholesterol 170s, hemoglobin A1c greater than 7  She is very active and takes care of a large family.   right knee replacement in July 2014   Details from her bypass surgery showed LIMA  to the LAD, vein graft to the PDA and vein graft to the OM Details from the cardiac catheterization January 2013 show proximal LAD occlusion, proximal left circumflex occlusion, proximal RCA occlusion, graft to the OM with 90% disease proximally. DES stent was placed.  Echocardiogram dated January 2012 shows normal systolic function, mild MR and TR   PMH:   has a past medical history of Anxiety; Atrial fibrillation ([place]); Coronary artery disease; Diabetes mellitus; GERD (gastroesophageal reflux disease); Heart murmur; Hyperlipidemia; Hypertension; Peripheral vascular disease ([place]); and UTI (urinary tract infection).  PSH:    Past Surgical History:  Procedure Laterality Date  . APPENDECTOMY    . BREAST BIOPSY Left    20 years ago  . BREAST BIOPSY Right 2008  . BREAST EXCISIONAL BIOPSY     rt 2008 lt "20 years ago"  . CARDIAC CATHETERIZATION    . CATARACT EXTRACTION W/PHACO Left 08/15/2015   Procedure: CATARACT EXTRACTION PHACO AND INTRAOCULAR LENS PLACEMENT (IOC);  Surgeon: Estill Cotta, MD;  Location: [facility] ORS;  Service: Ophthalmology;  Laterality: Left;  [place] 01:28 AP% 26.4 CDE 35.73 fluid pack # [id number]  . COLONOSCOPY WITH PROPOFOL N/A 01/16/2016   Procedure: COLONOSCOPY WITH PROPOFOL;  Surgeon: Manya Silvas, MD;  Location: [facility] ENDOSCOPY;  Service: Endoscopy;  Laterality: N/A;  . CORONARY ANGIOPLASTY WITH STENT PLACEMENT    . CORONARY ARTERY BYPASS GRAFT     with LIMA  to the LAD, SVG to OM1, SVG to PDA  . ESOPHAGOGASTRODUODENOSCOPY (EGD) WITH PROPOFOL N/A 01/16/2016   Procedure: ESOPHAGOGASTRODUODENOSCOPY (EGD) WITH PROPOFOL;  Surgeon: Manya Silvas, MD;  Location: [facility] ENDOSCOPY;  Service: Endoscopy;  Laterality: N/A;  . RENAL ARTERY STENT     left, By Dr Hulda Humphrey  . REPLACEMENT TOTAL KNEE  03/25/2013   right  . TONSILLECTOMY      Current Outpatient Prescriptions  Medication Sig Dispense Refill  . aspirin 81 MG tablet Take 81 mg by mouth daily.     . clopidogrel  (PLAVIX) 75 MG tablet Take 1 tablet (75 mg total) by mouth daily. 90 tablet 3  . cyanocobalamin (,VITAMIN B-12,) 1000 MCG/ML injection INJECT 1ML INTRAMUSCULARLY ONCE MONTHLY AS DIRECTED 1 mL 11  . glucose blood (ONE TOUCH TEST STRIPS) test strip USE AS DIRECTED TO CHECK BLOOD SUGAR ONCE DAILY E08.00 100 each 2  . lisinopril (PRINIVIL,ZESTRIL) 20 MG tablet TAKE 1 TABLET BY MOUTH ONCE DAILY 90 tablet 1  . metFORMIN (GLUCOPHAGE) 1000 MG tablet Take 1 tablet (1,000 mg total) by mouth 2 (two) times daily. 180 tablet 1  . metoprolol succinate (TOPROL-XL) 25 MG 24 hr tablet TAKE 1 TABLET BY MOUTH ONCE DAILY 90 tablet 0  . mirtazapine (REMERON) 30 MG tablet TAKE 1 TABLET BY MOUTH AT BEDTIME 30 tablet 11  . Multiple Vitamins-Minerals (MULTIVITAMIN WITH MINERALS) tablet Take 1 tablet by mouth daily.    . nitroGLYCERIN (NITROSTAT) 0.4 MG SL tablet Place 1 tablet (0.4 mg total) under the tongue every 5 (five) minutes as needed for chest pain. 25 tablet 6  . ONETOUCH DELICA LANCETS 24Q MISC USE AS INSTRUCTED TO TEST BLOOD SUGAR ONCE DAILY E08.00 100 each 2  . pantoprazole (PROTONIX) 40 MG tablet Take 1 tablet (40 mg total) by mouth daily. 90 tablet 2  . rosuvastatin (CRESTOR) 40 MG tablet TAKE 1 TABLET BY MOUTH EVERY DAY 90 tablet 2  . Vitamin D, Ergocalciferol, (DRISDOL) 50000 units CAPS capsule Take 1 capsule (50,000 Units total) by mouth every 7 (seven) days. 12 capsule 0   No current facility-administered medications for this visit.      Allergies:   Ciprofloxacin; Codeine; Erythromycin; and Tramadol   Social History:  The patient  reports that she has never smoked. She has never used smokeless tobacco. She reports that she does not drink alcohol or use drugs.   Family History:   family history includes Cancer in her brother; Heart disease in her father; Hyperlipidemia in her daughter; Other in her brother, brother, and brother.    Review of Systems: Review of Systems  Constitutional: Negative.    Respiratory: Positive for shortness of breath.   Cardiovascular: Positive for chest pain.  Gastrointestinal: Negative.   Musculoskeletal: Positive for back pain.       Legs burning at nighttime  Neurological: Negative.   Psychiatric/Behavioral: Negative.   All other systems reviewed and are negative.    PHYSICAL EXAM: VS:  BP 122/60 (BP Location: Left Arm, Patient Position: Sitting, Cuff Size: Normal)   Pulse (!) 57   Ht 5\' 3"  (1.6 m)   Wt 131 lb 12 oz (59.8 kg)   BMI 23.34 kg/m  , BMI Body mass index is 23.34 kg/m. GEN: Well nourished, well developed, in no acute distress  HEENT: normal  Neck: no JVD, carotid bruits, or masses Cardiac: RRR; no murmurs, rubs, or gallops,no edema  Respiratory:  clear to auscultation bilaterally, normal work of breathing GI: soft, nontender, nondistended, +  BS MS: no deformity or atrophy  Skin: warm and dry, no rash Neuro:  Strength and sensation are intact Psych: euthymic mood, full affect    Recent Labs: 02/01/2017: TSH 1.24 02/12/2017: Hemoglobin 10.4; Platelets 210.0 05/02/2017: ALT 11; BUN 28; Creatinine, Ser 1.15; Potassium 4.1; Sodium 138    Lipid Panel Lab Results  Component Value Date   CHOL 117 05/02/2017   HDL 35.40 (L) 05/02/2017   LDLCALC 61 05/02/2017   TRIG 102.0 05/02/2017      Wt Readings from Last 3 Encounters:  07/09/17 131 lb 12 oz (59.8 kg)  06/12/17 130 lb 1.6 oz (59 kg)  05/22/17 131 lb 14.4 oz (59.8 kg)       ASSESSMENT AND PLAN:  Essential hypertension - Plan: EKG 12-Lead Blood pressure is well controlled on today's visit. No changes made to the medications.  Coronary artery disease due to lipid rich plaque - Plan: EKG 12-Lead shortness of breath and chest tightness concerning for angina Also not sleeping well, not eating well After long discussion with her, stress test ordered to rule out ischemia  Other hyperlipidemia - Plan: EKG 12-Lead Cholesterol is at goal on the current lipid regimen. No  changes to the medications were made.  Renal artery stenosis (HCC) - Plan: EKG 12-Lead  followed by [name] Blood pressure stable  S/P CABG x 3 - Plan: EKG 12-Lead Currently with no symptoms of angina. No further workup at this time. Continue current medication regimen.  Neuropathy  Lower extremity discomfort only at night, legs burning  Adjustment disorder Loss of her husband several years ago Significant stress at home helping to take care of her grandson who has alcohol addiction Not sleeping at night, not eating well   Total encounter time more than 25 minutes  Greater than 50% was spent in counseling and coordination of care with the patient      F/U  6 months   No orders of the defined types were placed in this encounter.    Signed, Esmond Plants, M.D., Ph.D. 07/09/2017  [place], [place]

## 2017-07-09 NOTE — Patient Instructions (Addendum)
Medication Instructions:   No medication changes made  Labwork:  No new labs needed  Testing/Procedures:  We will order a treadmill myoview for shortness and chest pain [place] caregiver has ordered a Stress Test with nuclear imaging. The purpose of this test is to evaluate the blood supply to your heart muscle. This procedure is referred to as a "Non-Invasive Stress Test." This is because other than having an IV started in your vein, nothing is inserted or "invades" your body. Cardiac stress tests are done to find areas of poor blood flow to the heart by determining the extent of coronary artery disease (CAD). Some patients exercise on a treadmill, which naturally increases the blood flow to your heart, while others who are  unable to walk on a treadmill due to physical limitations have a pharmacologic/chemical stress agent called Lexiscan . This medicine will mimic walking on a treadmill by temporarily increasing your coronary blood flow.   Please note: these test may take anywhere between 2-4 hours to complete  PLEASE REPORT TO [facility] AT THE FIRST DESK WILL DIRECT YOU WHERE TO GO  Date of Procedure:_Thursday November 1st__  Arrival Time for Procedure:__07:45AM____  Instructions regarding medication:   _X___ : Hold diabetes medication Metformin (Glucophage) the morning of procedure  _X___:  Hold Metoprolol the night before procedure and morning of procedure    PLEASE NOTIFY THE OFFICE AT LEAST 24 HOURS IN ADVANCE IF YOU ARE UNABLE TO KEEP YOUR APPOINTMENT.  [phone] AND  PLEASE NOTIFY NUCLEAR MEDICINE AT [facility] AT LEAST 24 HOURS IN ADVANCE IF YOU ARE UNABLE TO KEEP YOUR APPOINTMENT. [phone]  How to prepare for your Myoview test:  1. Do not eat or drink after midnight 2. No caffeine for 24 hours prior to test 3. No smoking 24 hours prior to test. 4. Your medication may be taken with water.  If your doctor stopped a  medication because of this test, do not take that medication. 5. Ladies, please do not wear dresses.  Skirts or pants are appropriate. Please wear a short sleeve shirt. 6. No perfume, cologne or lotion. 7. Wear comfortable walking shoes. No heels!    Follow-Up: It was a pleasure seeing you in the office today. Please call us if you have new issues that need to be addressed before your next appt.  [phone]  Your physician wants you to follow-up in: 6 months.  You will receive a reminder letter in the mail two months in advance. If you don't receive a letter, please call our office to schedule the follow-up appointment.  If you need a refill on your cardiac medications before your next appointment, please call your pharmacy.

## 2017-07-12 IMAGING — MG MM DIGITAL SCREENING BILAT W/ CAD
4 series · 4 of 4 positions shown · non-contrast
Comparison: Previous exam(s).

CLINICAL DATA: Screening.

EXAM:
DIGITAL SCREENING BILATERAL MAMMOGRAM WITH CAD

[L CC]
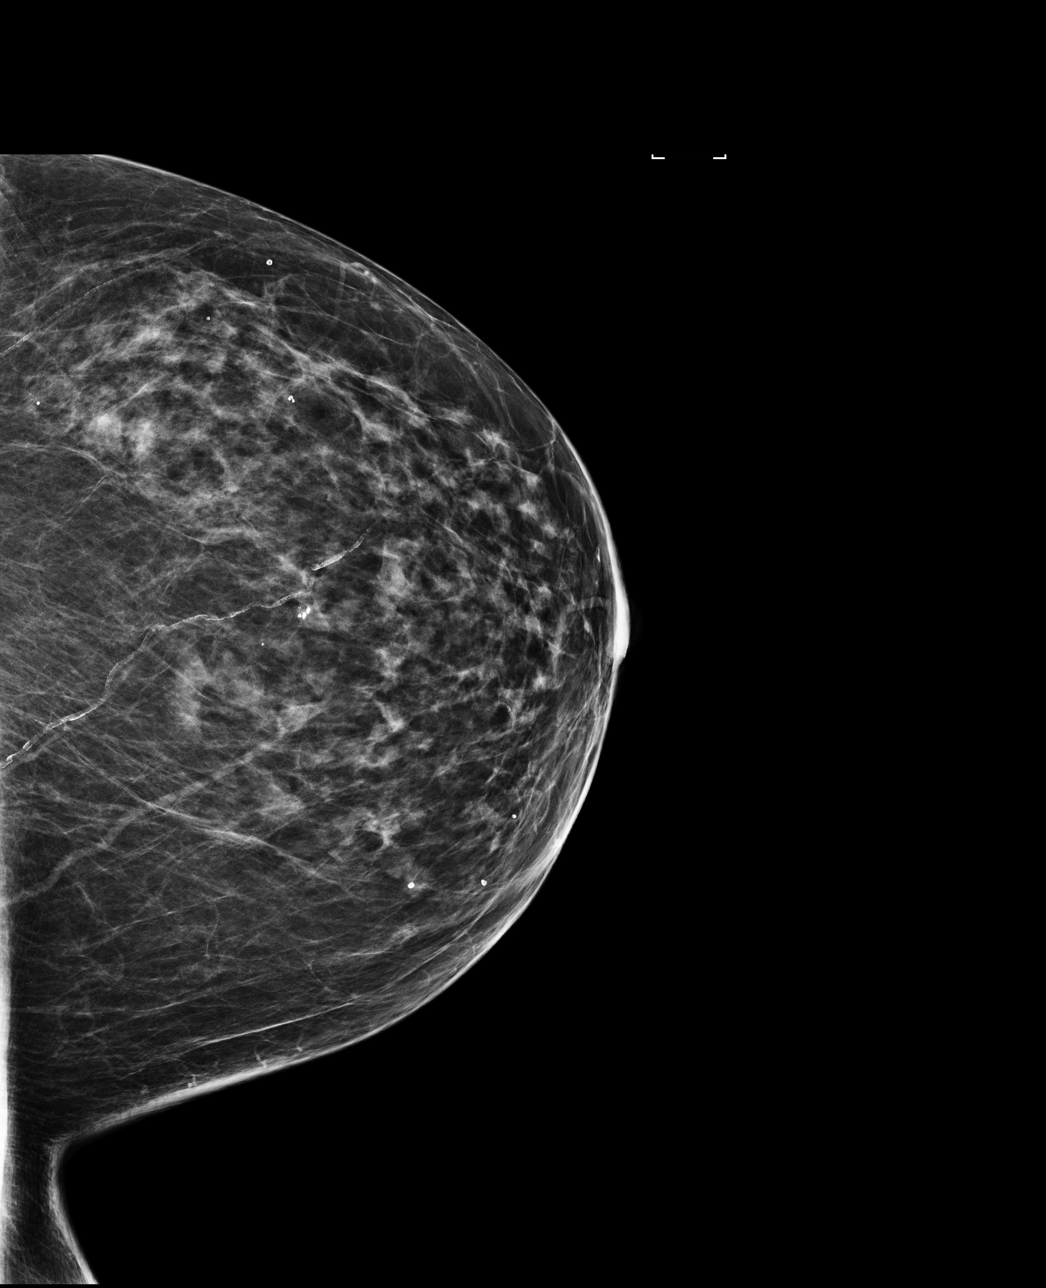

[R CC]
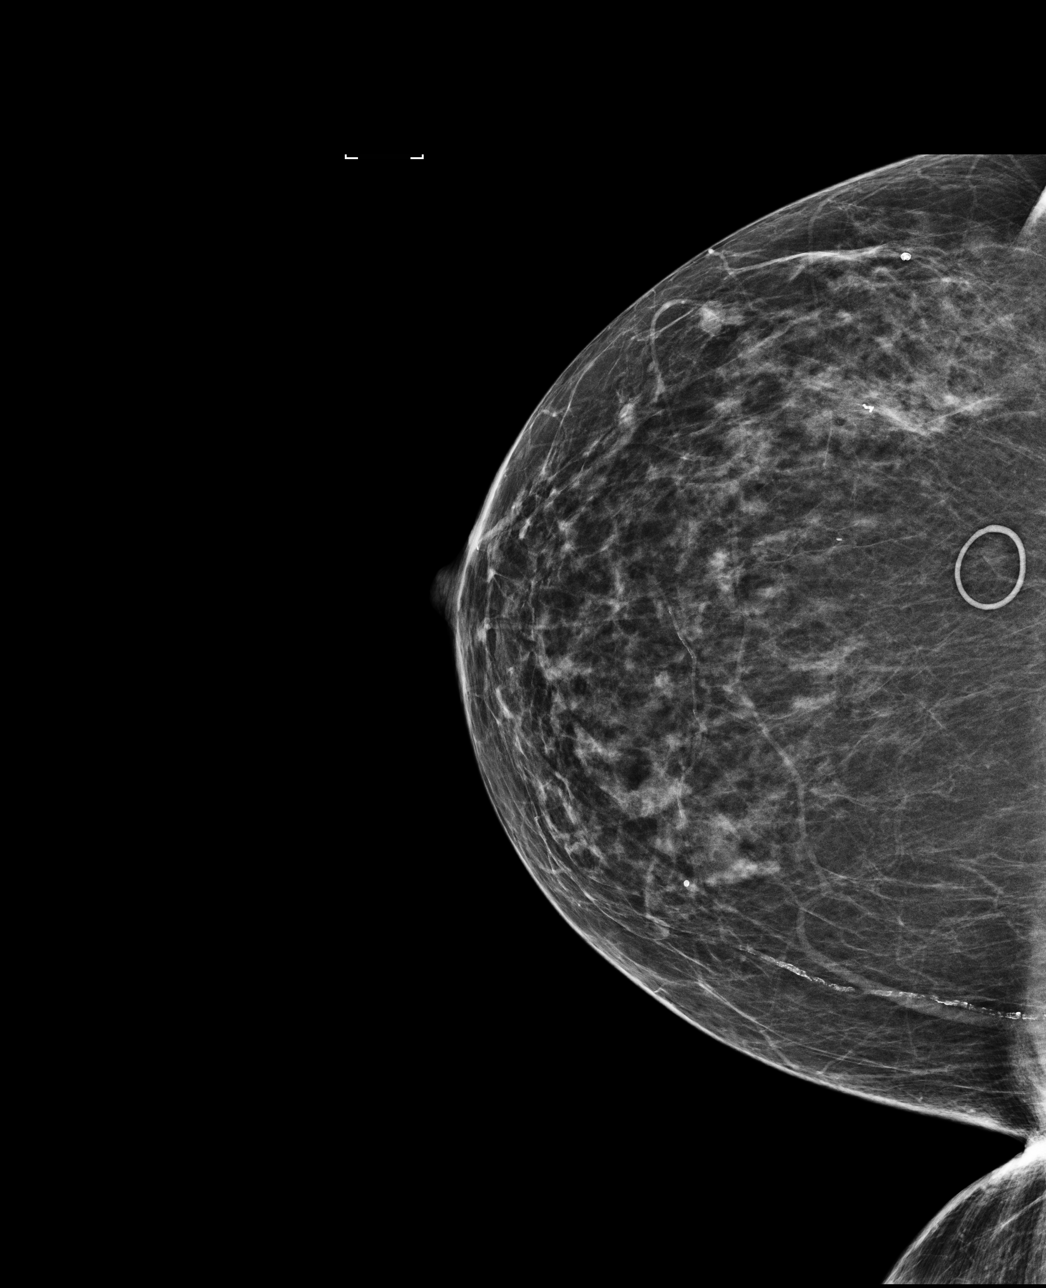

[L MLO]
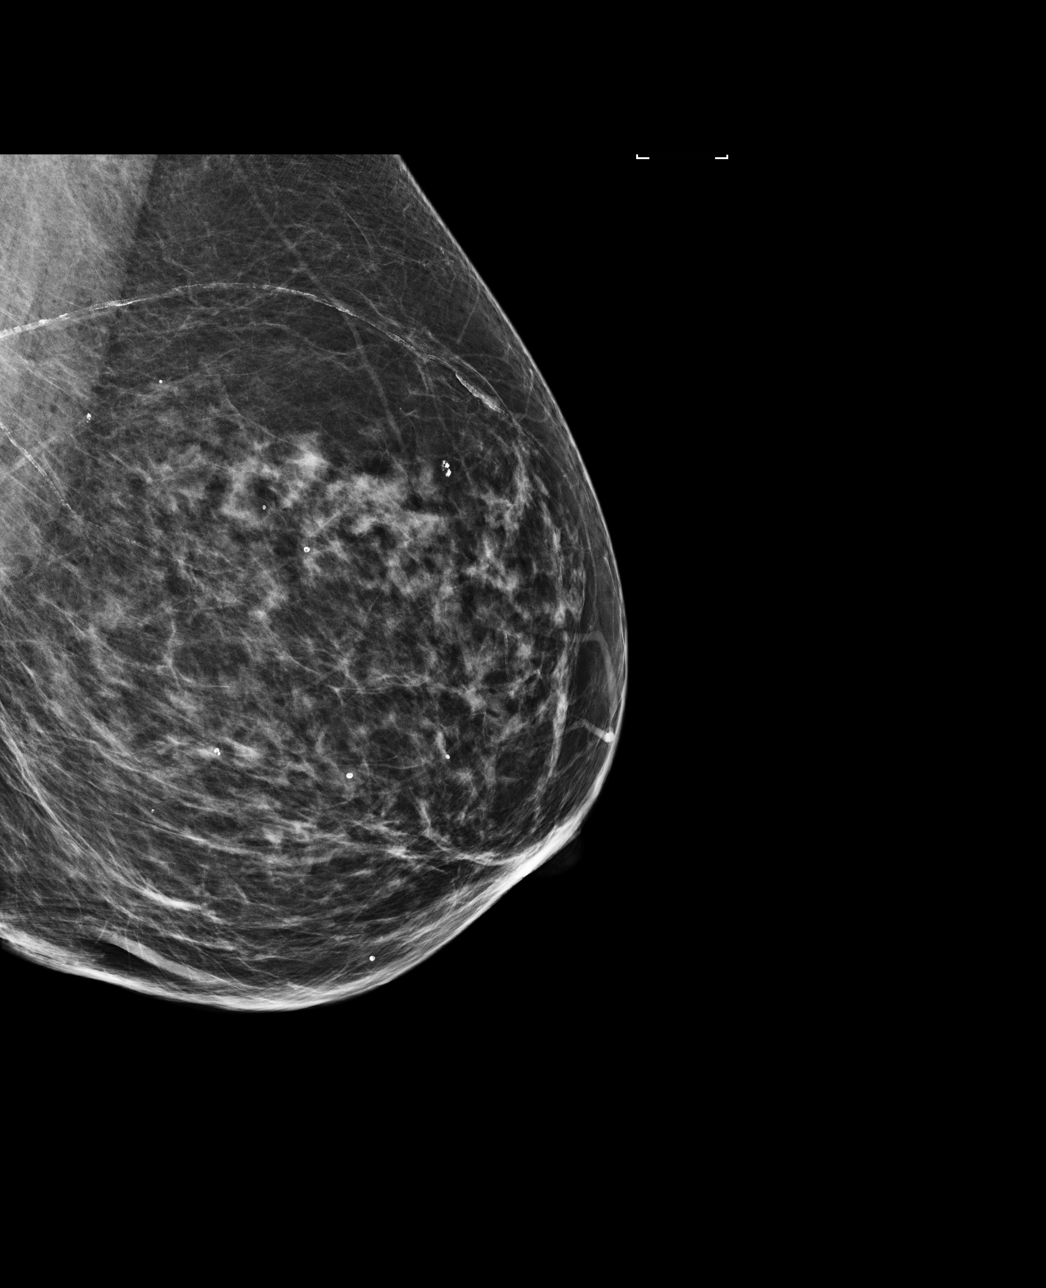

[R MLO]
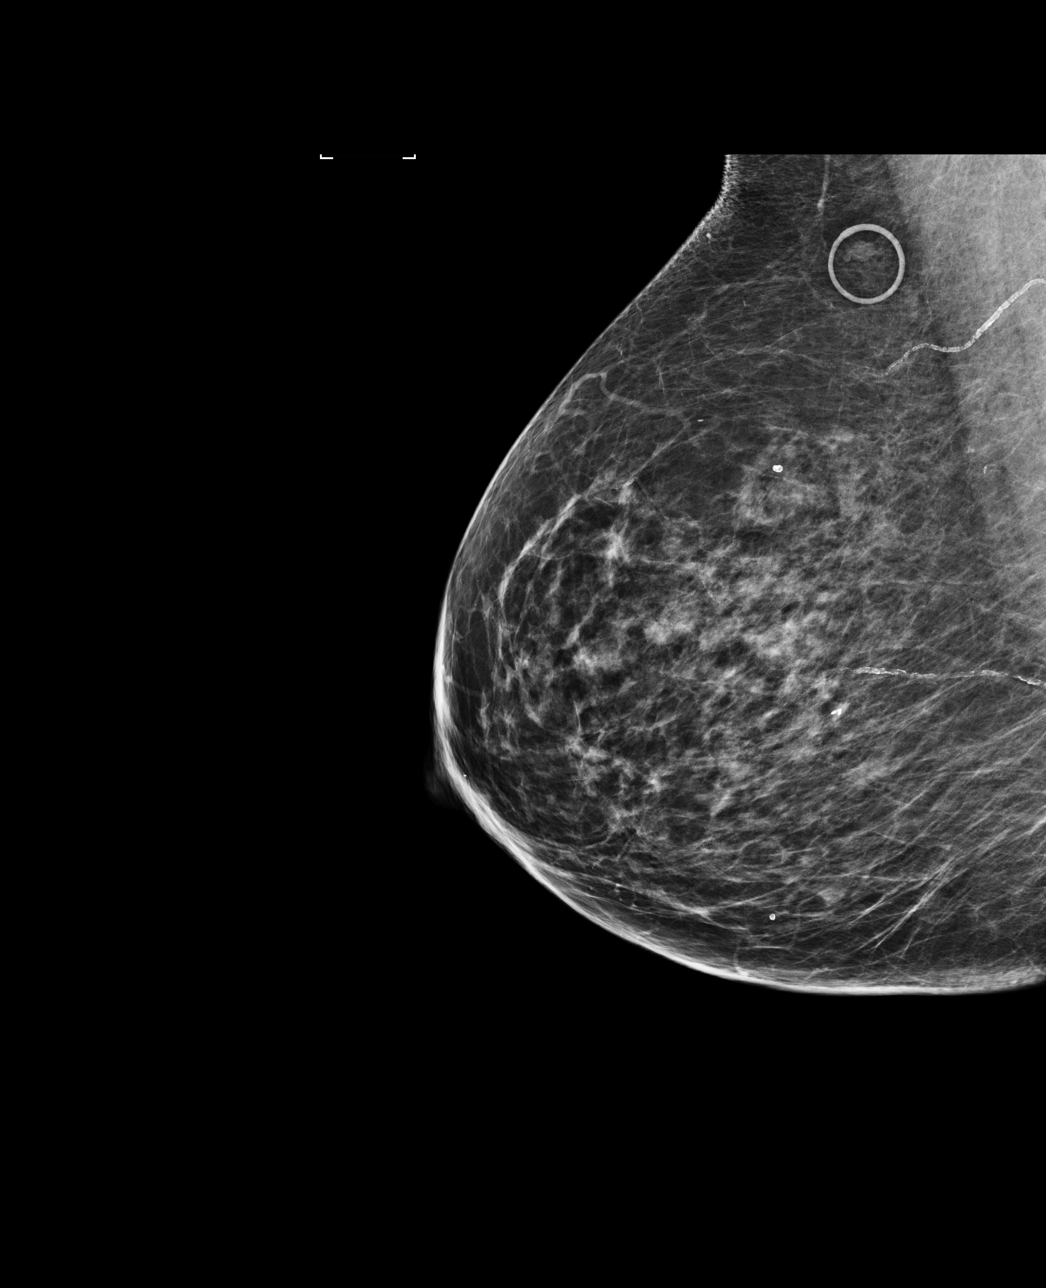

[4 of 4 positions shown; findings below may reference images not displayed]

ACR Breast Density Category c: The breast tissue is heterogeneously
dense, which may obscure small masses.
FINDINGS: There are no findings suspicious for malignancy. Images were
processed with CAD.
IMPRESSION: No mammographic evidence of malignancy. A result letter of this
screening mammogram will be mailed directly to the patient.

RECOMMENDATION:
Screening mammogram in one year. (Code:[id number])

BI-RADS CATEGORY  1: Negative.

## 2017-07-17 ENCOUNTER — Telehealth: Payer: Self-pay | Admitting: Cardiovascular Disease

## 2017-07-17 NOTE — Telephone Encounter (Signed)
Pt would like to r/s her stress test due to a UTI

## 2017-07-17 NOTE — Telephone Encounter (Signed)
Spoke with patient and she wanted to reschedule her testing due to not feeling well. Rescheduled Exercise Myoview to 07/24/17 at 09:00 AM with arrival time of 08:45AM. She verbalized understanding of all instructions and had no further questions at this time.

## 2017-07-24 ENCOUNTER — Ambulatory Visit
Admission: RE | Admit: 2017-07-24 | Discharge: 2017-07-24 | Disposition: A | Discharge status: Home / Self Care | Payer: Medicare HMO | Source: Ambulatory Visit | Attending: Cardiovascular Disease | Admitting: Cardiovascular Disease

## 2017-07-24 DIAGNOSIS — R079 Chest pain, unspecified: Secondary | ICD-10-CM | POA: Diagnosis not present

## 2017-07-24 DIAGNOSIS — R0602 Shortness of breath: Secondary | ICD-10-CM | POA: Insufficient documentation

## 2017-07-24 LAB — NM MYOCAR MULTI W/SPECT W/WALL MOTION / EF
CHL CUP MPHR: 142 {beats}/min
CHL CUP NUCLEAR SDS: 0
CHL CUP NUCLEAR SRS: 9
CHL CUP NUCLEAR SSS: 6
CHL CUP STRESS STAGE 1 GRADE: 0 %
CHL CUP STRESS STAGE 1 HR: 83 {beats}/min
CHL CUP STRESS STAGE 2 HR: 99 {beats}/min
CHL CUP STRESS STAGE 3 HR: 136 {beats}/min
CHL CUP STRESS STAGE 3 SBP: 122 mmHg
CHL CUP STRESS STAGE 3 SPEED: 1.7 mph
CHL CUP STRESS STAGE 4 SPEED: 2.5 mph
CHL CUP STRESS STAGE 5 GRADE: 0 %
CHL CUP STRESS STAGE 5 HR: 151 {beats}/min
CSEPEW: 7 METS
CSEPPHR: 144 {beats}/min
Exercise duration (min): 5 min
Exercise duration (sec): 10 s
LVDIAVOL: 35 mL (ref 46–106)
LVSYSVOL: 17 mL
NUC STRESS TID: 0.67
Percent HR: 101 %
Percent of predicted max HR: 101 %
Rest HR: 75 {beats}/min
Stage 1 Speed: 0 mph
Stage 2 Grade: 0 %
Stage 2 Speed: 0 mph
Stage 3 DBP: 68 mmHg
Stage 3 Grade: 10 %
Stage 4 Grade: 12 %
Stage 4 HR: 144 {beats}/min
Stage 5 Speed: 0 mph
Stage 6 DBP: 41 mmHg
Stage 6 Grade: 0 %
Stage 6 HR: 104 {beats}/min
Stage 6 SBP: 145 mmHg
Stage 6 Speed: 0 mph

## 2017-07-24 MED ORDER — TECHNETIUM TC 99M TETROFOSMIN IV KIT
8.6300 | PACK | Freq: Once | INTRAVENOUS | Status: AC | PRN
  Administered 2017-07-24: 8.63 via INTRAVENOUS

## 2017-07-24 MED ORDER — TECHNETIUM TC 99M TETROFOSMIN IV KIT
27.6260 | PACK | Freq: Once | INTRAVENOUS | Status: AC | PRN
  Administered 2017-07-24: 27.626 via INTRAVENOUS

## 2017-07-26 ENCOUNTER — Ambulatory Visit (INDEPENDENT_AMBULATORY_CARE_PROVIDER_SITE_OTHER): Payer: Medicare HMO

## 2017-07-26 DIAGNOSIS — Z23 Encounter for immunization: Secondary | ICD-10-CM

## 2017-08-01 ENCOUNTER — Other Ambulatory Visit: Payer: Self-pay | Admitting: Family Medicine

## 2017-08-05 ENCOUNTER — Other Ambulatory Visit: Payer: Self-pay | Admitting: Family Medicine

## 2017-08-06 DIAGNOSIS — R300 Dysuria: Secondary | ICD-10-CM | POA: Diagnosis not present

## 2017-08-06 DIAGNOSIS — I10 Essential (primary) hypertension: Secondary | ICD-10-CM | POA: Diagnosis not present

## 2017-08-06 DIAGNOSIS — R319 Hematuria, unspecified: Secondary | ICD-10-CM | POA: Diagnosis not present

## 2017-08-06 DIAGNOSIS — E119 Type 2 diabetes mellitus without complications: Secondary | ICD-10-CM | POA: Diagnosis not present

## 2017-08-06 DIAGNOSIS — N390 Urinary tract infection, site not specified: Secondary | ICD-10-CM | POA: Diagnosis not present

## 2017-08-07 ENCOUNTER — Ambulatory Visit: Payer: Medicare HMO | Admitting: Primary Care

## 2017-08-11 NOTE — Progress Notes (Signed)
error 

## 2017-08-22 DIAGNOSIS — R69 Illness, unspecified: Secondary | ICD-10-CM | POA: Diagnosis not present

## 2017-10-12 ENCOUNTER — Other Ambulatory Visit: Payer: Self-pay | Admitting: Cardiovascular Disease

## 2017-10-17 ENCOUNTER — Other Ambulatory Visit: Payer: Self-pay

## 2017-10-17 MED ORDER — PANTOPRAZOLE SODIUM 40 MG PO TBEC: 40.0000 mg | DELAYED_RELEASE_TABLET | Freq: Every day | ORAL | 2 refills | Status: DC

## 2017-10-17 NOTE — Telephone Encounter (Signed)
MT-Plz see refill req/thx dmf 

## 2017-10-24 ENCOUNTER — Other Ambulatory Visit (INDEPENDENT_AMBULATORY_CARE_PROVIDER_SITE_OTHER): Payer: Self-pay | Admitting: Vascular Surgery

## 2017-10-24 DIAGNOSIS — I159 Secondary hypertension, unspecified: Secondary | ICD-10-CM

## 2017-10-29 ENCOUNTER — Ambulatory Visit (INDEPENDENT_AMBULATORY_CARE_PROVIDER_SITE_OTHER): Payer: Self-pay | Admitting: Vascular Surgery

## 2017-10-29 ENCOUNTER — Encounter (INDEPENDENT_AMBULATORY_CARE_PROVIDER_SITE_OTHER): Payer: Self-pay

## 2017-10-30 ENCOUNTER — Other Ambulatory Visit: Payer: Self-pay | Admitting: Family Medicine

## 2017-11-03 ENCOUNTER — Telehealth: Payer: Self-pay | Admitting: Family Medicine

## 2017-11-03 DIAGNOSIS — E0800 Diabetes mellitus due to underlying condition with hyperosmolarity without nonketotic hyperglycemic-hyperosmolar coma (NKHHC): Secondary | ICD-10-CM

## 2017-11-03 DIAGNOSIS — E7849 Other hyperlipidemia: Secondary | ICD-10-CM

## 2017-11-03 DIAGNOSIS — I10 Essential (primary) hypertension: Secondary | ICD-10-CM

## 2017-11-03 DIAGNOSIS — E559 Vitamin D deficiency, unspecified: Secondary | ICD-10-CM

## 2017-11-03 DIAGNOSIS — E538 Deficiency of other specified B group vitamins: Secondary | ICD-10-CM

## 2017-11-03 NOTE — Telephone Encounter (Signed)
-----   Message from Eustace Pen, LPN sent at 0/11/7942  2:52 PM EST ----- Regarding: Labs 2/19 Lab orders needed. Thank you.  Insurance:  [employer]

## 2017-11-04 ENCOUNTER — Other Ambulatory Visit: Payer: Self-pay | Admitting: Family Medicine

## 2017-11-04 DIAGNOSIS — Z1231 Encounter for screening mammogram for malignant neoplasm of breast: Secondary | ICD-10-CM

## 2017-11-06 ENCOUNTER — Ambulatory Visit (INDEPENDENT_AMBULATORY_CARE_PROVIDER_SITE_OTHER): Payer: Medicare HMO

## 2017-11-06 ENCOUNTER — Other Ambulatory Visit: Payer: Self-pay | Admitting: Family Medicine

## 2017-11-06 VITALS — BP 108/60 | HR 61 | Temp 98.5°F | Ht 62.5 in | Wt 128.8 lb

## 2017-11-06 DIAGNOSIS — R300 Dysuria: Secondary | ICD-10-CM | POA: Diagnosis not present

## 2017-11-06 DIAGNOSIS — E0800 Diabetes mellitus due to underlying condition with hyperosmolarity without nonketotic hyperglycemic-hyperosmolar coma (NKHHC): Secondary | ICD-10-CM

## 2017-11-06 DIAGNOSIS — E538 Deficiency of other specified B group vitamins: Secondary | ICD-10-CM | POA: Diagnosis not present

## 2017-11-06 DIAGNOSIS — E559 Vitamin D deficiency, unspecified: Secondary | ICD-10-CM | POA: Diagnosis not present

## 2017-11-06 DIAGNOSIS — I10 Essential (primary) hypertension: Secondary | ICD-10-CM | POA: Diagnosis not present

## 2017-11-06 DIAGNOSIS — R8290 Unspecified abnormal findings in urine: Secondary | ICD-10-CM

## 2017-11-06 DIAGNOSIS — Z0000 Encounter for general adult medical examination without abnormal findings: Secondary | ICD-10-CM

## 2017-11-06 DIAGNOSIS — E7849 Other hyperlipidemia: Secondary | ICD-10-CM | POA: Diagnosis not present

## 2017-11-06 LAB — POCT URINALYSIS DIPSTICK
BILIRUBIN UA: NEGATIVE
GLUCOSE UA: NEGATIVE
Ketones, UA: NEGATIVE
Leukocytes, UA: NEGATIVE
Nitrite, UA: NEGATIVE
Spec Grav, UA: 1.025 (ref 1.010–1.025)
Urobilinogen, UA: 0.2 E.U./dL
pH, UA: 6 (ref 5.0–8.0)

## 2017-11-06 LAB — LIPID PANEL
CHOL/HDL RATIO: 4
CHOLESTEROL: 127 mg/dL (ref 0–200)
HDL: 34 mg/dL — ABNORMAL LOW (ref >39.00)
LDL CALC: 64 mg/dL (ref 0–99)
NONHDL: 92.72
Triglycerides: 144 mg/dL (ref 0.0–149.0)
VLDL: 28.8 mg/dL (ref 0.0–40.0)

## 2017-11-06 LAB — COMPREHENSIVE METABOLIC PANEL
ALT: 11 U/L (ref 0–35)
AST: 20 U/L (ref 0–37)
Albumin: 4.2 g/dL (ref 3.5–5.2)
Alkaline Phosphatase: 46 U/L (ref 39–117)
BUN: 29 mg/dL — ABNORMAL HIGH (ref 6–23)
CHLORIDE: 106 meq/L (ref 96–112)
CO2: 28 meq/L (ref 19–32)
Calcium: 10.4 mg/dL (ref 8.4–10.5)
Creatinine, Ser: 1.3 mg/dL — ABNORMAL HIGH (ref 0.40–1.20)
GFR: 41.97 mL/min — ABNORMAL LOW (ref >60.00)
GLUCOSE: 144 mg/dL — AB (ref 70–99)
POTASSIUM: 4.3 meq/L (ref 3.5–5.1)
SODIUM: 139 meq/L (ref 135–145)
TOTAL PROTEIN: 6.7 g/dL (ref 6.0–8.3)
Total Bilirubin: 0.5 mg/dL (ref 0.2–1.2)

## 2017-11-06 LAB — VITAMIN D 25 HYDROXY (VIT D DEFICIENCY, FRACTURES): VITD: 52.66 ng/mL (ref 30.00–100.00)

## 2017-11-06 LAB — CBC WITH DIFFERENTIAL/PLATELET
BASOS PCT: 0.5 % (ref 0.0–3.0)
Basophils Absolute: 0 10*3/uL (ref 0.0–0.1)
EOS PCT: 2.2 % (ref 0.0–5.0)
Eosinophils Absolute: 0.1 10*3/uL (ref 0.0–0.7)
HCT: 32.3 % — ABNORMAL LOW (ref 36.0–46.0)
Hemoglobin: 11 g/dL — ABNORMAL LOW (ref 12.0–15.0)
LYMPHS ABS: 1.6 10*3/uL (ref 0.7–4.0)
Lymphocytes Relative: 35.7 % (ref 12.0–46.0)
MCHC: 33.9 g/dL (ref 30.0–36.0)
MCV: 87 fl (ref 78.0–100.0)
MONOS PCT: 7.7 % (ref 3.0–12.0)
Monocytes Absolute: 0.3 10*3/uL (ref 0.1–1.0)
NEUTROS ABS: 2.4 10*3/uL (ref 1.4–7.7)
NEUTROS PCT: 53.9 % (ref 43.0–77.0)
Platelets: 178 10*3/uL (ref 150.0–400.0)
RBC: 3.72 Mil/uL — ABNORMAL LOW (ref 3.87–5.11)
RDW: 13.3 % (ref 11.5–15.5)
WBC: 4.5 10*3/uL (ref 4.0–10.5)

## 2017-11-06 LAB — HEMOGLOBIN A1C: HEMOGLOBIN A1C: 7.5 % — AB (ref 4.6–6.5)

## 2017-11-06 LAB — VITAMIN B12: Vitamin B-12: 571 pg/mL (ref 211–911)

## 2017-11-06 LAB — TSH: TSH: 2.96 u[IU]/mL (ref 0.35–4.50)

## 2017-11-06 MED ORDER — CEPHALEXIN 250 MG PO CAPS: 250.0000 mg | ORAL_CAPSULE | Freq: Two times a day (BID) | ORAL | 0 refills | Status: DC

## 2017-11-06 NOTE — Patient Instructions (Signed)
[name] , Thank you for taking time to come for your Medicare Wellness Visit. I appreciate your ongoing commitment to your health goals. Please review the following plan we discussed and let me know if I can assist you in the future.   These are the goals we discussed: Goals    . Follow up with Primary Care Provider     Starting 11/06/2017, I will continue to take medications as prescribed and to keep appointments with PCP as scheduled.        This is a list of the screening recommended for you and due dates:  Health Maintenance  Topic Date Due  . Pneumonia vaccines (2 of 2 - PPSV23) 11/06/2018*  . Eye exam for diabetics  11/15/2017  . Hemoglobin A1C  05/06/2018  . Complete foot exam   05/07/2018  . Tetanus Vaccine  03/18/2023  . Flu Shot  Completed  . DEXA scan (bone density measurement)  Completed  *Topic was postponed. The date shown is not the original due date.   Preventive Care for Adults  A healthy lifestyle and preventive care can promote health and wellness. Preventive health guidelines for adults include the following key practices.  . A routine yearly physical is a good way to check with your health care provider about your health and preventive screening. It is a chance to share any concerns and updates on your health and to receive a thorough exam.  . Visit your dentist for a routine exam and preventive care every 6 months. Brush your teeth twice a day and floss once a day. Good oral hygiene prevents tooth decay and gum disease.  . The frequency of eye exams is based on your age, health, family medical history, use  of contact lenses, and other factors. Follow your health care provider's recommendations for frequency of eye exams.  . Eat a healthy diet. Foods like vegetables, fruits, whole grains, low-fat dairy products, and lean protein foods contain the nutrients you need without too many calories. Decrease your intake of foods high in solid fats, added sugars,  and salt. Eat the right amount of calories for you. Get information about a proper diet from your health care provider, if necessary.  . Regular physical exercise is one of the most important things you can do for your health. Most adults should get at least 150 minutes of moderate-intensity exercise (any activity that increases your heart rate and causes you to sweat) each week. In addition, most adults need muscle-strengthening exercises on 2 or more days a week.  Silver Sneakers may be a benefit available to you. To determine eligibility, you may visit the website: www.silversneakers.com or contact program at (343)341-1437 Mon-Fri between 8AM-8PM.   . Maintain a healthy weight. The body mass index (BMI) is a screening tool to identify possible weight problems. It provides an estimate of body fat based on height and weight. Your health care provider can find your BMI and can help you achieve or maintain a healthy weight.   For adults 20 years and older: ? A BMI below 18.5 is considered underweight. ? A BMI of 18.5 to 24.9 is normal. ? A BMI of 25 to 29.9 is considered overweight. ? A BMI of 30 and above is considered obese.   . Maintain normal blood lipids and cholesterol levels by exercising and minimizing your intake of saturated fat. Eat a balanced diet with plenty of fruit and vegetables. Blood tests for lipids and cholesterol should begin at age 76  and be repeated every 5 years. If your lipid or cholesterol levels are high, you are over 50, or you are at high risk for heart disease, you may need your cholesterol levels checked more frequently. Ongoing high lipid and cholesterol levels should be treated with medicines if diet and exercise are not working.  . If you smoke, find out from your health care provider how to quit. If you do not use tobacco, please do not start.  . If you choose to drink alcohol, please do not consume more than 2 drinks per day. One drink is considered to be 12  ounces (355 mL) of beer, 5 ounces (148 mL) of wine, or 1.5 ounces (44 mL) of liquor.  . If you are 55-74 years old, ask your health care provider if you should take aspirin to prevent strokes.  . Use sunscreen. Apply sunscreen liberally and repeatedly throughout the day. You should seek shade when your shadow is shorter than you. Protect yourself by wearing long sleeves, pants, a wide-brimmed hat, and sunglasses year round, whenever you are outdoors.  . Once a month, do a whole body skin exam, using a mirror to look at the skin on your back. Tell your health care provider of new moles, moles that have irregular borders, moles that are larger than a pencil eraser, or moles that have changed in shape or color.

## 2017-11-06 NOTE — Progress Notes (Signed)
PCP notes:   Health maintenance:  PPSV23 - postponed/pt is ill today A1C - completed  Abnormal screenings:   Hearing - failed  Hearing Screening   125Hz  250Hz  500Hz  1000Hz  2000Hz  3000Hz  4000Hz  6000Hz  8000Hz   Right ear:   40 0 40  0    Left ear:   40 40 40  40     Depression score: 10 Depression screen [facility] 2/9 11/06/2017 10/26/2016  Decreased Interest 1 0  Down, Depressed, Hopeless 1 0  PHQ - 2 Score 2 0  Altered sleeping 3 -  Tired, decreased energy 2 -  Change in appetite 3 -  Feeling bad or failure about yourself  0 -  Trouble concentrating 0 -  Moving slowly or fidgety/restless 0 -  Suicidal thoughts 0 -  PHQ-9 Score 10 -  Difficult doing work/chores Somewhat difficult -   Patient concerns:   Patient reports burning when urinating. UA ordered by PCP.   Patient reports weight loss due to poor appetite.   Nurse concerns:  None  Next PCP appt:   11/11/17 @ 1430  I reviewed health advisor's note, was available for consultation, and agree with documentation and plan. Loura Pardon MD

## 2017-11-06 NOTE — Telephone Encounter (Signed)
Her ua looks pos from her amw today  Please send for cx  Please send in keflex and let her know Enc fluids

## 2017-11-06 NOTE — Telephone Encounter (Signed)
Rx sent to pharmacy and urine cx ordered

## 2017-11-06 NOTE — Progress Notes (Signed)
Subjective:   Tracey [name] is a 82 y.o. female who presents for Medicare Annual (Subsequent) preventive examination.  Review of Systems:  N/A Cardiac Risk Factors include: advanced age (>38men, >54 women);diabetes mellitus;dyslipidemia;hypertension     Objective:     Vitals: BP 108/60 (BP Location: Right Arm, Patient Position: Sitting, Cuff Size: Normal)   Pulse 61   Temp 98.5 F (36.9 C) (Oral)   Ht 5' 2.5" (1.588 m) Comment: no shoes  Wt 128 lb 12 oz (58.4 kg)   SpO2 95%   BMI 23.17 kg/m   Body mass index is 23.17 kg/m.  Advanced Directives 11/06/2017 10/26/2016 08/15/2015  Does Patient Have a Medical Advance Directive? Yes No Yes  Type of Paramedic of [place];Living will - [place];Living will  Does patient want to make changes to medical advance directive? - - No - Patient declined  Copy of [place] in Chart? No - copy requested - No - copy requested    Tobacco Social History   Tobacco Use  Smoking Status Never Smoker  Smokeless Tobacco Never Used     Counseling given: No   Clinical Intake:  Pre-visit preparation completed: Yes  Pain : No/denies pain Pain Score: 0-No pain     Nutritional Status: BMI of 19-24  Normal Nutritional Risks: Unintentional weight loss(poor appetite has caused weight loss) Diabetes: No  How often do you need to have someone help you when you read instructions, pamphlets, or other written materials from your doctor or pharmacy?: 1 - Never What is the last grade level you completed in school?: 12th grade  Interpreter Needed?: No  Comments: pt is a widow and lives alone Information entered by :: LPinson, LPN  Past Medical History:  Diagnosis Date  . Anxiety   . Atrial fibrillation ([place])    history  . Coronary artery disease   . Diabetes mellitus    non insulin dependent  . GERD (gastroesophageal reflux disease)   . Heart murmur   . Hyperlipidemia   .  Hypertension   . Peripheral vascular disease ([place])   . UTI (urinary tract infection)    Past Surgical History:  Procedure Laterality Date  . APPENDECTOMY    . BREAST BIOPSY Left    20 years ago  . BREAST BIOPSY Right 2008  . BREAST EXCISIONAL BIOPSY     rt 2008 lt "20 years ago"  . CARDIAC CATHETERIZATION    . CATARACT EXTRACTION W/PHACO Left 08/15/2015   Procedure: CATARACT EXTRACTION PHACO AND INTRAOCULAR LENS PLACEMENT (IOC);  Surgeon: Estill Cotta, MD;  Location: [facility] ORS;  Service: Ophthalmology;  Laterality: Left;  [place] 01:28 AP% 26.4 CDE 35.73 fluid pack # [id number]  . COLONOSCOPY WITH PROPOFOL N/A 01/16/2016   Procedure: COLONOSCOPY WITH PROPOFOL;  Surgeon: Manya Silvas, MD;  Location[facility] ENDOSCOPY;  Service: Endoscopy;  Laterality: N/A;  . CORONARY ANGIOPLASTY WITH STENT PLACEMENT    . CORONARY ARTERY BYPASS GRAFT     with LIMA  to the LAD, SVG to OM1, SVG to PDA  . ESOPHAGOGASTRODUODENOSCOPY (EGD) WITH PROPOFOL N/A 01/16/2016   Procedure: ESOPHAGOGASTRODUODENOSCOPY (EGD) WITH PROPOFOL;  Surgeon: Manya Silvas, MD;  Location: [facility] ENDOSCOPY;  Service: Endoscopy;  Laterality: N/A;  . RENAL ARTERY STENT     left, By Dr Hulda Humphrey  . REPLACEMENT TOTAL KNEE  03/25/2013   right  . TONSILLECTOMY     Family History  Problem Relation Age of Onset  . Heart disease  Father   . Other Brother        open heart surgery  . Cancer Brother   . Other Brother        open heart surgery  . Other Brother        open heart surgery  . Hyperlipidemia Daughter   . Breast cancer Neg Hx   . Kidney cancer Neg Hx   . Bladder Cancer Neg Hx    Social History   Socioeconomic History  . Marital status: Widowed    Spouse name: None  . Number of children: None  . Years of education: None  . Highest education level: None  Social Needs  . Financial resource strain: None  . Food insecurity - worry: None  . Food insecurity - inability: None  . Transportation needs - medical: None  .  Transportation needs - non-medical: None  Occupational History  . None  Tobacco Use  . Smoking status: Never Smoker  . Smokeless tobacco: Never Used  Substance and Sexual Activity  . Alcohol use: No  . Drug use: No  . Sexual activity: No  Other Topics Concern  . None  Social History Narrative  . None    Outpatient Encounter Medications as of 11/06/2017  Medication Sig  . aspirin 81 MG tablet Take 81 mg by mouth daily.   . clopidogrel (PLAVIX) 75 MG tablet TAKE 1 TABLET BY MOUTH EVERY DAY  . cyanocobalamin (,VITAMIN B-12,) 1000 MCG/ML injection INJECT 1ML INTRAMUSCULARLY ONCE MONTHLY AS DIRECTED  . glucose blood (ONE TOUCH TEST STRIPS) test strip USE AS DIRECTED TO CHECK BLOOD SUGAR ONCE DAILY E08.00  . lisinopril (PRINIVIL,ZESTRIL) 20 MG tablet TAKE 1 TABLET BY MOUTH ONCE DAILY  . metFORMIN (GLUCOPHAGE) 1000 MG tablet TAKE 1 TABLET BY MOUTH TWICE DAILY  . metoprolol succinate (TOPROL-XL) 25 MG 24 hr tablet TAKE 1 TABLET BY MOUTH EVERY DAY  . mirtazapine (REMERON) 30 MG tablet TAKE 1 TABLET BY MOUTH AT BEDTIME  . Multiple Vitamins-Minerals (MULTIVITAMIN WITH MINERALS) tablet Take 1 tablet by mouth daily.  [name] LANCETS 12W MISC USE AS INSTRUCTED TO TEST BLOOD SUGAR ONCE DAILY E08.00  . pantoprazole (PROTONIX) 40 MG tablet Take 1 tablet (40 mg total) by mouth daily.  . rosuvastatin (CRESTOR) 40 MG tablet TAKE 1 TABLET BY MOUTH EVERY DAY  . Vitamin D, Ergocalciferol, (DRISDOL) 50000 units CAPS capsule Take 1 capsule (50,000 Units total) by mouth every 7 (seven) days.  . nitroGLYCERIN (NITROSTAT) 0.4 MG SL tablet Place 1 tablet (0.4 mg total) under the tongue every 5 (five) minutes as needed for chest pain.   No facility-administered encounter medications on file as of 11/06/2017.     Activities of Daily Living In your present state of health, do you have any difficulty performing the following activities: 11/06/2017  Hearing? N  Vision? N  Difficulty concentrating or  making decisions? N  Walking or climbing stairs? Y  Dressing or bathing? N  Doing errands, shopping? N  Preparing Food and eating ? N  Using the Toilet? N  In the past six months, have you accidently leaked urine? N  Do you have problems with loss of bowel control? N  Managing your Medications? N  Managing your Finances? N  Housekeeping or managing your Housekeeping? N  Some recent data might be hidden    Patient Care Team: Tower, Wynelle Fanny, MD as PCP - General (Family Medicine) Manya Silvas, MD (Gastroenterology) Minna Merritts, MD as Consulting Physician (Cardiology)  Estill Cotta, MD as Consulting Physician (Ophthalmology)    Assessment:   This is a routine wellness examination for Tracey [name].   Hearing Screening   125Hz  250Hz  500Hz  1000Hz  2000Hz  3000Hz  4000Hz  6000Hz  8000Hz   Right ear:   40 0 40  0    Left ear:   40 40 40  40    Vision Screening Comments: Last vision exam in March 2018 with Dr. Sandra Cockayne   Exercise Activities and Dietary recommendations Current Exercise Habits: The patient does not participate in regular exercise at present, Exercise limited by: None identified  Goals    . Follow up with Primary Care Provider     Starting 11/06/2017, I will continue to take medications as prescribed and to keep appointments with PCP as scheduled.        Fall Risk Fall Risk  11/06/2017 10/26/2016  Falls in the past year? No Yes  Comment - accident fall in yard while gardening; pt did not seek medical treatment  Number falls in past yr: - 1  Injury with Fall? - No   Depression Screen PHQ 2/9 Scores 11/06/2017 10/26/2016  PHQ - 2 Score 2 0  PHQ- 9 Score 10 -     Cognitive Function MMSE - Mini Mental State Exam 11/06/2017 10/26/2016  Orientation to time 5 5  Orientation to Place 5 5  Registration 3 3  Attention/ Calculation 0 0  Recall 3 3  Language- name 2 objects 0 0  Language- repeat 1 1  Language- follow 3 step command 3 3  Language- read & follow  direction 0 0  Write a sentence 0 0  Copy design 0 0  Total score 20 20     PLEASE NOTE: A Mini-Cog screen was completed. Maximum score is 20. A value of 0 denotes this part of [place] MMSE was not completed or the patient failed this part of the Mini-Cog screening.   Mini-Cog Screening Orientation to Time - Max 5 pts Orientation to Place - Max 5 pts Registration - Max 3 pts Recall - Max 3 pts Language Repeat - Max 1 pts Language Follow 3 Step Command - Max 3 pts     Immunization History  Administered Date(s) Administered  . Influenza Split 08/10/2015  . Influenza,inj,Quad PF,6+ Mos 05/24/2016, 07/26/2017  . Influenza-Unspecified 08/10/2015, 05/24/2016  . Pneumococcal Conjugate-13 11/05/2016  . Td 03/17/2013   Screening Tests Health Maintenance  Topic Date Due  . PNA vac Low Risk Adult (2 of 2 - PPSV23) 11/06/2018 (Originally 11/05/2017)  . OPHTHALMOLOGY EXAM  11/15/2017  . HEMOGLOBIN A1C  05/06/2018  . FOOT EXAM  05/07/2018  . TETANUS/TDAP  03/18/2023  . INFLUENZA VACCINE  Completed  . DEXA SCAN  Completed       Plan:    I have personally reviewed, addressed, and noted the following in the patient's chart:  A. Medical and social history B. Use of alcohol, tobacco or illicit drugs  C. Current medications and supplements D. Functional ability and status E.  Nutritional status F.  Physical activity G. Advance directives H. List of other physicians I.  Hospitalizations, surgeries, and ER visits in previous 12 months J.  [place] to include hearing, vision, cognitive, depression L. Referrals and appointments - none  In addition, I have reviewed and discussed with patient certain preventive protocols, quality metrics, and best practice recommendations. A written personalized care plan for preventive services as well as general preventive health recommendations were provided to patient.  See attached scanned  questionnaire for additional information.    Signed,   Lindell Noe, MHA, BS, LPN Health Coach

## 2017-11-07 LAB — URINE CULTURE
MICRO NUMBER:: 90224692
RESULT: NO GROWTH
SPECIMEN QUALITY:: ADEQUATE

## 2017-11-11 ENCOUNTER — Ambulatory Visit (INDEPENDENT_AMBULATORY_CARE_PROVIDER_SITE_OTHER): Payer: Medicare HMO | Admitting: Family Medicine

## 2017-11-11 ENCOUNTER — Encounter: Payer: Self-pay | Admitting: Family Medicine

## 2017-11-11 VITALS — BP 128/62 | HR 65 | Temp 97.7°F | Ht 62.5 in | Wt 130.5 lb

## 2017-11-11 DIAGNOSIS — M8580 Other specified disorders of bone density and structure, unspecified site: Secondary | ICD-10-CM

## 2017-11-11 DIAGNOSIS — E0800 Diabetes mellitus due to underlying condition with hyperosmolarity without nonketotic hyperglycemic-hyperosmolar coma (NKHHC): Secondary | ICD-10-CM | POA: Diagnosis not present

## 2017-11-11 DIAGNOSIS — E538 Deficiency of other specified B group vitamins: Secondary | ICD-10-CM

## 2017-11-11 DIAGNOSIS — I480 Paroxysmal atrial fibrillation: Secondary | ICD-10-CM | POA: Insufficient documentation

## 2017-11-11 DIAGNOSIS — E559 Vitamin D deficiency, unspecified: Secondary | ICD-10-CM

## 2017-11-11 DIAGNOSIS — I10 Essential (primary) hypertension: Secondary | ICD-10-CM

## 2017-11-11 DIAGNOSIS — N289 Disorder of kidney and ureter, unspecified: Secondary | ICD-10-CM

## 2017-11-11 DIAGNOSIS — Z0000 Encounter for general adult medical examination without abnormal findings: Secondary | ICD-10-CM

## 2017-11-11 DIAGNOSIS — R69 Illness, unspecified: Secondary | ICD-10-CM | POA: Diagnosis not present

## 2017-11-11 DIAGNOSIS — I4891 Unspecified atrial fibrillation: Secondary | ICD-10-CM | POA: Diagnosis not present

## 2017-11-11 DIAGNOSIS — F32A Depression, unspecified: Secondary | ICD-10-CM

## 2017-11-11 DIAGNOSIS — E7849 Other hyperlipidemia: Secondary | ICD-10-CM | POA: Diagnosis not present

## 2017-11-11 DIAGNOSIS — F329 Major depressive disorder, single episode, unspecified: Secondary | ICD-10-CM

## 2017-11-11 DIAGNOSIS — I701 Atherosclerosis of renal artery: Secondary | ICD-10-CM

## 2017-11-11 DIAGNOSIS — D649 Anemia, unspecified: Secondary | ICD-10-CM | POA: Diagnosis not present

## 2017-11-11 MED ORDER — METFORMIN HCL 1000 MG PO TABS: 1000.0000 mg | ORAL_TABLET | Freq: Two times a day (BID) | ORAL | 3 refills | Status: DC

## 2017-11-11 MED ORDER — LISINOPRIL 20 MG PO TABS
20.0000 mg | ORAL_TABLET | Freq: Every day | ORAL | 3 refills | Status: DC
Start: 2017-11-11 — End: 2018-11-13

## 2017-11-11 MED ORDER — MIRTAZAPINE 30 MG PO TABS: 30.0000 mg | ORAL_TABLET | Freq: Every day | ORAL | 3 refills | Status: DC

## 2017-11-11 MED ORDER — METOPROLOL SUCCINATE ER 25 MG PO TB24: 25.0000 mg | ORAL_TABLET | Freq: Every day | ORAL | 3 refills | Status: DC

## 2017-11-11 NOTE — Assessment & Plan Note (Signed)
Chronic  Stable to slt improved

## 2017-11-11 NOTE — Assessment & Plan Note (Signed)
For f/u Dr Lucky Cowboy next mo  Lab Results  Component Value Date   CREATININE 1.30 (H) 11/06/2017   watching this closely (top of her baseline)  She continues asa and plavix and lipid/bp control

## 2017-11-11 NOTE — Assessment & Plan Note (Signed)
Doing well under cardiology care

## 2017-11-11 NOTE — Assessment & Plan Note (Signed)
Lab Results  Component Value Date   VITAMINB12 571 11/06/2017   Continues monthly B12 shots

## 2017-11-11 NOTE — Assessment & Plan Note (Signed)
Likely multifactorial and needs to be watched carefully  Disc renal artery stenosis and DM  bp and lipids are controlled Lab Results  Component Value Date   CREATININE 1.30 (H) 11/06/2017   this is top of her baseline If higher-will need to change metformin  Also urged to try weaning protonix if able  Avoid renal toxic meds Continue good water intake F/u 3 mo

## 2017-11-11 NOTE — Progress Notes (Signed)
Subjective:    Patient ID: Tracey [name], female    DOB: 10-25-1935, 82 y.o.   MRN: [id number]  HPI  Here for health maintenance exam and to review chronic medical problems    More tired lately  Feels bad in general  Poor appetite - fixes something and does not want to eat it  Winter has been hard - low motivation  No exercise/activity   (walks when weather permits)  More depressed  Not getting joy out of things anymore  She takes her mirtazapine 30 mg   Stress - grandson / etoh addicted in rehab (she does not think he is in the right place)  This troubles her  He has mental illness also   She does not want to see a counselor  Thinks once she can get outside would be better   PHQ-9 score is 10  Goes to sleep easily-then wakes up 2-3 hours later (busy mind)  Gets up - then sits in recliner   No sob or cp  Just tired  Sees Dr Rockey Situ in march for heart - will let him know     Wt Readings from Last 3 Encounters:  11/11/17 130 lb 8 oz (59.2 kg)  11/06/17 128 lb 12 oz (58.4 kg)  07/09/17 131 lb 12 oz (59.8 kg)  weight is good/stable  23.49 kg/m   amw was 2/20 Missed some hearing tones in R ear - does not bother your   Due for ppsv 23  Will do today   PHQ score is 10   dexa 5/18  -osteopenia  D level is good at 52 No falls or fractures Also takes ca and D    Colonoscopy 5/17 one polyp (adenoma)  Dr Tiffany Kocher - 5 year recall   Mammogram 3/18 neg - already scheduled for next month  Self breast exam- no lumps  Gyn appt April 1    HTN  -in setting of CAD/ renal artery stenosis  bp is stable today  No cp or palpitations or headaches or edema  No side effects to medicines  BP Readings from Last 3 Encounters:  11/11/17 128/62  11/06/17 108/60  07/09/17 122/60     Sees Dr Lucky Cowboy in march for renal artery stenosis  Lab Results  Component Value Date   CREATININE 1.30 (H) 11/06/2017   BUN 29 (H) 11/06/2017   NA 139 11/06/2017   K 4.3 11/06/2017   CL 106  11/06/2017   CO2 28 11/06/2017  she drinks water  4 bottles (16 oz) per day / and a coffee  Sometimes she drinks more    Lab Results  Component Value Date   ALT 11 11/06/2017   AST 20 11/06/2017   ALKPHOS 46 11/06/2017   BILITOT 0.5 11/06/2017     DM2 Lab Results  Component Value Date   HGBA1C 7.5 (H) 11/06/2017  this is down from 7.6  Before that 7.9   Checking glucose at home - it is staying around 140s-160s  Not eating much but does not always eat the right things   Some starches/potatoes/rice  - goes back to that sometimes  No sweets or sugar drinks  No fast food  Not a lot of bread   Chronic anemia  Lab Results  Component Value Date   WBC 4.5 11/06/2017   HGB 11.0 (L) 11/06/2017   HCT 32.3 (L) 11/06/2017   MCV 87.0 11/06/2017   PLT 178.0 11/06/2017   takes iron    Hyperlipidemia  Lab Results  Component Value Date   CHOL 127 11/06/2017   CHOL 117 05/02/2017   CHOL 148 10/26/2016   Lab Results  Component Value Date   HDL 34.00 (L) 11/06/2017   HDL 35.40 (L) 05/02/2017   HDL 40.10 10/26/2016   Lab Results  Component Value Date   LDLCALC 64 11/06/2017   LDLCALC 61 05/02/2017   LDLCALC 82 10/26/2016   Lab Results  Component Value Date   TRIG 144.0 11/06/2017   TRIG 102.0 05/02/2017   TRIG 129.0 10/26/2016   Lab Results  Component Value Date   CHOLHDL 4 11/06/2017   CHOLHDL 3 05/02/2017   CHOLHDL 4 10/26/2016   No results found for: LDLDIRECT  Statin and diet  B12 def Lab Results  Component Value Date   [id number] 921 11/06/2017  monthly B12 shot   Lab Results  Component Value Date   TSH 2.96 11/06/2017    Patient Active Problem List   Diagnosis Date Noted  . Atrial fibrillation ([place]) 11/11/2017  . Routine general medical examination at a health care facility 11/11/2017  . Renal insufficiency 11/11/2017  . Osteopenia 04/30/2017  . Anemia 02/12/2017  . Vitamin D deficiency 02/03/2017  . Well woman exam 11/05/2016  . Vitamin  B12 deficiency 11/05/2016  . Depression 04/19/2016  . Fatigue 02/06/2016  . Angina pectoris ([place]) 08/26/2013  . Hypertension 01/22/2012  . Coronary heart disease 01/22/2012  . S/P CABG x 3 01/22/2012  . Hyperlipidemia 01/22/2012  . Diabetes mellitus ([place]) 01/22/2012  . Renal artery stenosis ([place]) 01/22/2012   Past Medical History:  Diagnosis Date  . Anxiety   . Atrial fibrillation ([place])    history  . Coronary artery disease   . Diabetes mellitus    non insulin dependent  . GERD (gastroesophageal reflux disease)   . Heart murmur   . Hyperlipidemia   . Hypertension   . Peripheral vascular disease ([place])   . UTI (urinary tract infection)    Past Surgical History:  Procedure Laterality Date  . APPENDECTOMY    . BREAST BIOPSY Left    20 years ago  . BREAST BIOPSY Right 2008  . BREAST EXCISIONAL BIOPSY     rt 2008 lt "20 years ago"  . CARDIAC CATHETERIZATION    . CATARACT EXTRACTION W/PHACO Left 08/15/2015   Procedure: CATARACT EXTRACTION PHACO AND INTRAOCULAR LENS PLACEMENT (IOC);  Surgeon: Estill Cotta, MD;  Location: [facility] ORS;  Service: Ophthalmology;  Laterality: Left;  [place] 01:28 AP% 26.4 CDE 35.73 fluid pack # [id number]  . COLONOSCOPY WITH PROPOFOL N/A 01/16/2016   Procedure: COLONOSCOPY WITH PROPOFOL;  Surgeon: Manya Silvas, MD;  Location: [facility] ENDOSCOPY;  Service: Endoscopy;  Laterality: N/A;  . CORONARY ANGIOPLASTY WITH STENT PLACEMENT    . CORONARY ARTERY BYPASS GRAFT     with LIMA  to the LAD, SVG to OM1, SVG to PDA  . ESOPHAGOGASTRODUODENOSCOPY (EGD) WITH PROPOFOL N/A 01/16/2016   Procedure: ESOPHAGOGASTRODUODENOSCOPY (EGD) WITH PROPOFOL;  Surgeon: Manya Silvas, MD;  Location: [facility] ENDOSCOPY;  Service: Endoscopy;  Laterality: N/A;  . RENAL ARTERY STENT     left, By Dr Hulda Humphrey  . REPLACEMENT TOTAL KNEE  03/25/2013   right  . TONSILLECTOMY     Social History   Tobacco Use  . Smoking status: Never Smoker  . Smokeless tobacco: Never Used  Substance Use  Topics  . Alcohol use: No  . Drug use: No   Family History  Problem Relation Age of Onset  .  Heart disease Father   . Other Brother        open heart surgery  . Cancer Brother   . Other Brother        open heart surgery  . Other Brother        open heart surgery  . Hyperlipidemia Daughter   . Breast cancer Neg Hx   . Kidney cancer Neg Hx   . Bladder Cancer Neg Hx    Allergies  Allergen Reactions  . Ciprofloxacin Other (See Comments)  . Codeine     Nausea   . Erythromycin Nausea And Vomiting  . Tramadol    Current Outpatient Medications on File Prior to Visit  Medication Sig Dispense Refill  . aspirin 81 MG tablet Take 81 mg by mouth daily.     . clopidogrel (PLAVIX) 75 MG tablet TAKE 1 TABLET BY MOUTH EVERY DAY 90 tablet 3  . cyanocobalamin (,VITAMIN B-12,) 1000 MCG/ML injection INJECT 1ML INTRAMUSCULARLY ONCE MONTHLY AS DIRECTED 1 mL 11  . glucose blood (ONE TOUCH TEST STRIPS) test strip USE AS DIRECTED TO CHECK BLOOD SUGAR ONCE DAILY E08.00 100 each 2  . Multiple Vitamins-Minerals (MULTIVITAMIN WITH MINERALS) tablet Take 1 tablet by mouth daily.    [name] LANCETS 95A MISC USE AS INSTRUCTED TO TEST BLOOD SUGAR ONCE DAILY E08.00 100 each 2  . pantoprazole (PROTONIX) 40 MG tablet Take 1 tablet (40 mg total) by mouth daily. 90 tablet 2  . rosuvastatin (CRESTOR) 40 MG tablet TAKE 1 TABLET BY MOUTH EVERY DAY 90 tablet 2  . Vitamin D, Ergocalciferol, (DRISDOL) 50000 units CAPS capsule Take 1 capsule (50,000 Units total) by mouth every 7 (seven) days. 12 capsule 0  . nitroGLYCERIN (NITROSTAT) 0.4 MG SL tablet Place 1 tablet (0.4 mg total) under the tongue every 5 (five) minutes as needed for chest pain. 25 tablet 6   No current facility-administered medications on file prior to visit.     Review of Systems  Constitutional: Positive for appetite change and fatigue. Negative for activity change, fever and unexpected weight change.  HENT: Negative for congestion,  rhinorrhea, sore throat and trouble swallowing.   Eyes: Negative for pain, redness, itching and visual disturbance.  Respiratory: Negative for cough, chest tightness, shortness of breath and wheezing.   Cardiovascular: Negative for chest pain and palpitations.  Gastrointestinal: Negative for abdominal pain, blood in stool, constipation, diarrhea and nausea.  Endocrine: Negative for cold intolerance, heat intolerance, polydipsia and polyuria.  Genitourinary: Negative for difficulty urinating, dysuria, frequency and urgency.  Musculoskeletal: Negative for arthralgias, joint swelling and myalgias.  Skin: Negative for pallor and rash.  Neurological: Negative for dizziness, tremors, weakness, light-headedness, numbness and headaches.  Hematological: Negative for adenopathy. Does not bruise/bleed easily.  Psychiatric/Behavioral: Positive for dysphoric mood and sleep disturbance. Negative for decreased concentration and suicidal ideas. The patient is nervous/anxious.        Objective:   Physical Exam  Constitutional: She appears well-developed and well-nourished. No distress.  Well appearing   HENT:  Head: Normocephalic and atraumatic.  Right Ear: External ear normal.  Left Ear: External ear normal.  Nose: Nose normal.  Mouth/Throat: Oropharynx is clear and moist.  Eyes: Conjunctivae and EOM are normal. Pupils are equal, round, and reactive to light. Right eye exhibits no discharge. Left eye exhibits no discharge. No scleral icterus.  Neck: Normal range of motion. Neck supple. No JVD present. Carotid bruit is not present. No thyromegaly present.  Cardiovascular: Normal rate, regular rhythm, normal  heart sounds and intact distal pulses. Exam reveals no gallop.  Pulmonary/Chest: Effort normal and breath sounds normal. No respiratory distress. She has no wheezes. She has no rales.  Abdominal: Soft. Bowel sounds are normal. She exhibits no distension and no mass. There is no tenderness.    Genitourinary:  Genitourinary Comments: Gyn does her female exam  Musculoskeletal: She exhibits no edema or tenderness.  Mild kyphosis   Lymphadenopathy:    She has no cervical adenopathy.  Neurological: She is alert. She has normal reflexes. No cranial nerve deficit. She exhibits normal muscle tone. Coordination normal.  Skin: Skin is warm and dry. No rash noted. No erythema. No pallor.  Solar lentigines diffusely   Psychiatric: Her speech is normal and behavior is normal. Thought content normal. Her mood appears anxious. Her affect is not blunt, not labile and not inappropriate. Thought content is not paranoid. Cognition and memory are normal. She exhibits a depressed mood. She expresses no homicidal and no suicidal ideation.  Mentally sharp           Assessment & Plan:   Problem List Items Addressed This Visit      Cardiovascular and Mediastinum   Atrial fibrillation [facility])    Doing well under cardiology care       Relevant Medications   lisinopril (PRINIVIL,ZESTRIL) 20 MG tablet   metoprolol succinate (TOPROL-XL) 25 MG 24 hr tablet   Hypertension   Relevant Medications   lisinopril (PRINIVIL,ZESTRIL) 20 MG tablet   metoprolol succinate (TOPROL-XL) 25 MG 24 hr tablet   Renal artery stenosis ([place])    For f/u Dr Lucky Cowboy next mo  Lab Results  Component Value Date   CREATININE 1.30 (H) 11/06/2017   watching this closely (top of her baseline)  She continues asa and plavix and lipid/bp control        Relevant Medications   lisinopril (PRINIVIL,ZESTRIL) 20 MG tablet   metoprolol succinate (TOPROL-XL) 25 MG 24 hr tablet     Endocrine   Diabetes mellitus ([place])    Lab Results  Component Value Date   HGBA1C 7.5 (H) 11/06/2017   Apprehensive to add more medicine Also may have to change metformin if cr goes up  On ace  utd eye exam  Diet is close to good  Disc plan to add daily exercise/activity as tolerated        Relevant Medications   lisinopril (PRINIVIL,ZESTRIL)  20 MG tablet   metFORMIN (GLUCOPHAGE) 1000 MG tablet     Musculoskeletal and Integument   Osteopenia    Rev dexa 5/18 D level is good in the 50s  No falls or fx  Disc need for calcium/ vitamin D/ wt bearing exercise and bone density test every 2 y to monitor Disc safety/ fracture risk in detail          Genitourinary   Renal insufficiency    Likely multifactorial and needs to be watched carefully  Disc renal artery stenosis and DM  bp and lipids are controlled Lab Results  Component Value Date   CREATININE 1.30 (H) 11/06/2017   this is top of her baseline If higher-will need to change metformin  Also urged to try weaning protonix if able  Avoid renal toxic meds Continue good water intake F/u 3 mo         Other   Anemia    Chronic  Stable to slt improved       Depression    Suspect worse-manifesting as fatigue/malaise and lack of  motivation  Reviewed stressors/ coping techniques/symptoms/ support sources/ tx options and side effects in detail today  Voices great concern about her grandson  Suggested counseling-she declined Wants to try exercise/getting out more and consider adding medication only if needed  Will watch closely  F/u 3 mo      Relevant Medications   mirtazapine (REMERON) 30 MG tablet   Hyperlipidemia    Disc goals for lipids and reasons to control them Rev labs with pt Rev low sat fat diet in detail  Good control with crestor and diet in setting of vascular dz      Relevant Medications   lisinopril (PRINIVIL,ZESTRIL) 20 MG tablet   metoprolol succinate (TOPROL-XL) 25 MG 24 hr tablet   Routine general medical examination at a health care facility - Primary    Reviewed health habits including diet and exercise and skin cancer prevention Reviewed appropriate screening tests for age  Also reviewed health mt list, fam hx and immunization status , as well as social and family history   Reviewed amw Reviewed labs Recommend exercise plan to help  general health and mood  PNA 23 vaccine today  She will get on wait list for shingrix       Vitamin B12 deficiency    Lab Results  Component Value Date   [id number] 409 11/06/2017   Continues monthly B12 shots       Vitamin D deficiency    Vitamin D level is therapeutic with current supplementation Disc importance of this to bone and overall health Level in 37s

## 2017-11-11 NOTE — Patient Instructions (Addendum)
You need an indoor exercise plan  Think about silver sneakers -it is a fantastic program  Try to get out more   If you want to see a counselor let us know  If you want to try adding medication  / let us know   If you are interested in the new shingles vaccine (Shingrix) - call your local pharmacy to check on coverage and availability    Pneumonia vaccine today   For diabetes  Try to get most of your carbohydrates from produce (with the exception of white potatoes)  Eat less bread/pasta/rice/snack foods/cereals/sweets and other items from the middle of the grocery store (processed carbs)   Eating better and exercise may help the diabetes   If you do not need the protonix - try taking every other day for a while and see how you do

## 2017-11-11 NOTE — Assessment & Plan Note (Signed)
Disc goals for lipids and reasons to control them Rev labs with pt Rev low sat fat diet in detail  Good control with crestor and diet in setting of vascular dz

## 2017-11-11 NOTE — Assessment & Plan Note (Signed)
Rev dexa 5/18 D level is good in the 50s  No falls or fx  Disc need for calcium/ vitamin D/ wt bearing exercise and bone density test every 2 y to monitor Disc safety/ fracture risk in detail

## 2017-11-11 NOTE — Assessment & Plan Note (Signed)
Suspect worse-manifesting as fatigue/malaise and lack of motivation  Reviewed stressors/ coping techniques/symptoms/ support sources/ tx options and side effects in detail today  Voices great concern about her grandson  Suggested counseling-she declined Wants to try exercise/getting out more and consider adding medication only if needed  Will watch closely  F/u 3 mo

## 2017-11-11 NOTE — Assessment & Plan Note (Signed)
Lab Results  Component Value Date   HGBA1C 7.5 (H) 11/06/2017   Apprehensive to add more medicine Also may have to change metformin if cr goes up  On ace  utd eye exam  Diet is close to good  Disc plan to add daily exercise/activity as tolerated

## 2017-11-11 NOTE — Assessment & Plan Note (Signed)
Reviewed health habits including diet and exercise and skin cancer prevention Reviewed appropriate screening tests for age  Also reviewed health mt list, fam hx and immunization status , as well as social and family history   Reviewed amw Reviewed labs Recommend exercise plan to help general health and mood  PNA 23 vaccine today  She will get on wait list for shingrix

## 2017-11-11 NOTE — Assessment & Plan Note (Signed)
Vitamin D level is therapeutic with current supplementation Disc importance of this to bone and overall health Level in 50s 

## 2017-12-09 ENCOUNTER — Ambulatory Visit
Admission: RE | Admit: 2017-12-09 | Discharge: 2017-12-09 | Disposition: A | Discharge status: Home / Self Care | Payer: Medicare HMO | Source: Ambulatory Visit | Attending: Family Medicine | Admitting: Family Medicine

## 2017-12-09 DIAGNOSIS — Z1231 Encounter for screening mammogram for malignant neoplasm of breast: Secondary | ICD-10-CM | POA: Diagnosis not present

## 2017-12-11 ENCOUNTER — Ambulatory Visit (INDEPENDENT_AMBULATORY_CARE_PROVIDER_SITE_OTHER): Payer: Medicare HMO

## 2017-12-11 DIAGNOSIS — Z23 Encounter for immunization: Secondary | ICD-10-CM | POA: Diagnosis not present

## 2017-12-12 ENCOUNTER — Other Ambulatory Visit (INDEPENDENT_AMBULATORY_CARE_PROVIDER_SITE_OTHER): Payer: Self-pay | Admitting: Vascular Surgery

## 2017-12-12 DIAGNOSIS — I150 Renovascular hypertension: Secondary | ICD-10-CM

## 2017-12-12 DIAGNOSIS — I701 Atherosclerosis of renal artery: Secondary | ICD-10-CM

## 2017-12-13 ENCOUNTER — Encounter (INDEPENDENT_AMBULATORY_CARE_PROVIDER_SITE_OTHER): Payer: Self-pay | Admitting: Vascular Surgery

## 2017-12-13 ENCOUNTER — Ambulatory Visit (INDEPENDENT_AMBULATORY_CARE_PROVIDER_SITE_OTHER): Payer: Medicare HMO | Admitting: Vascular Surgery

## 2017-12-13 ENCOUNTER — Ambulatory Visit (INDEPENDENT_AMBULATORY_CARE_PROVIDER_SITE_OTHER): Payer: Medicare HMO

## 2017-12-13 VITALS — BP 113/59 | HR 59 | Resp 13 | Ht 62.5 in | Wt 128.0 lb

## 2017-12-13 DIAGNOSIS — I150 Renovascular hypertension: Secondary | ICD-10-CM | POA: Diagnosis not present

## 2017-12-13 DIAGNOSIS — I701 Atherosclerosis of renal artery: Secondary | ICD-10-CM | POA: Diagnosis not present

## 2017-12-13 DIAGNOSIS — E0800 Diabetes mellitus due to underlying condition with hyperosmolarity without nonketotic hyperglycemic-hyperosmolar coma (NKHHC): Secondary | ICD-10-CM | POA: Diagnosis not present

## 2017-12-13 DIAGNOSIS — I10 Essential (primary) hypertension: Secondary | ICD-10-CM | POA: Diagnosis not present

## 2017-12-13 NOTE — Assessment & Plan Note (Signed)
blood pressure control important in reducing the progression of atherosclerotic disease. On appropriate oral medications.  

## 2017-12-13 NOTE — Progress Notes (Signed)
MRN : [id number]  Tracey [name] is a 82 y.o. (February 17, 1936) female who presents with chief complaint of  Chief Complaint  Patient presents with  . Follow-up    2 year Renal f/u  .  History of Present Illness: Patient returns today in follow up of renal artery stenosis.  She is over 10 years status post left renal artery intervention.  She is doing well without any major complaints today.  She denies worsening hypertension.  She brings in some labs today and she has a creatinine of 1.3.  Her duplex today shows normal velocities in both kidneys with some elevated velocities in the mid infrarenal aorta.  She does not have ischemic rest pain or tissue loss.  Current Outpatient Medications  Medication Sig Dispense Refill  . aspirin 81 MG tablet Take 81 mg by mouth daily.     . clopidogrel (PLAVIX) 75 MG tablet TAKE 1 TABLET BY MOUTH EVERY DAY 90 tablet 3  . cyanocobalamin (,VITAMIN B-12,) 1000 MCG/ML injection INJECT 1ML INTRAMUSCULARLY ONCE MONTHLY AS DIRECTED 1 mL 11  . glucose blood (ONE TOUCH TEST STRIPS) test strip USE AS DIRECTED TO CHECK BLOOD SUGAR ONCE DAILY E08.00 100 each 2  . lisinopril (PRINIVIL,ZESTRIL) 20 MG tablet Take 1 tablet (20 mg total) by mouth daily. 90 tablet 3  . metFORMIN (GLUCOPHAGE) 1000 MG tablet Take 1 tablet (1,000 mg total) by mouth 2 (two) times daily. 180 tablet 3  . metoprolol succinate (TOPROL-XL) 25 MG 24 hr tablet Take 1 tablet (25 mg total) by mouth daily. 90 tablet 3  . mirtazapine (REMERON) 30 MG tablet Take 1 tablet (30 mg total) by mouth at bedtime. 90 tablet 3  . Multiple Vitamins-Minerals (MULTIVITAMIN WITH MINERALS) tablet Take 1 tablet by mouth daily.    [name] LANCETS 02H MISC USE AS INSTRUCTED TO TEST BLOOD SUGAR ONCE DAILY E08.00 100 each 2  . pantoprazole (PROTONIX) 40 MG tablet Take 1 tablet (40 mg total) by mouth daily. 90 tablet 2  . rosuvastatin (CRESTOR) 40 MG tablet TAKE 1 TABLET BY MOUTH EVERY DAY 90 tablet 2  . Vitamin D,  Ergocalciferol, (DRISDOL) 50000 units CAPS capsule Take 1 capsule (50,000 Units total) by mouth every 7 (seven) days. 12 capsule 0  . nitroGLYCERIN (NITROSTAT) 0.4 MG SL tablet Place 1 tablet (0.4 mg total) under the tongue every 5 (five) minutes as needed for chest pain. 25 tablet 6   No current facility-administered medications for this visit.     Past Medical History:  Diagnosis Date  . Anxiety   . Atrial fibrillation ([place])    history  . Coronary artery disease   . Diabetes mellitus    non insulin dependent  . GERD (gastroesophageal reflux disease)   . Heart murmur   . Hyperlipidemia   . Hypertension   . Peripheral vascular disease ([place])   . UTI (urinary tract infection)     Past Surgical History:  Procedure Laterality Date  . APPENDECTOMY    . BREAST BIOPSY Left    20 years ago  . BREAST BIOPSY Right 2008  . BREAST EXCISIONAL BIOPSY     rt 2008 lt "20 years ago"  . CARDIAC CATHETERIZATION    . CATARACT EXTRACTION W/PHACO Left 08/15/2015   Procedure: CATARACT EXTRACTION PHACO AND INTRAOCULAR LENS PLACEMENT (IOC);  Surgeon: Estill Cotta, MD;  Location: [facility] ORS;  Service: Ophthalmology;  Laterality: Left;  [place] 01:28 AP% 26.4 CDE 35.73 fluid pack # [id number]  . COLONOSCOPY  WITH PROPOFOL N/A 01/16/2016   Procedure: COLONOSCOPY WITH PROPOFOL;  Surgeon: Manya Silvas, MD;  Location: [facility] ENDOSCOPY;  Service: Endoscopy;  Laterality: N/A;  . CORONARY ANGIOPLASTY WITH STENT PLACEMENT    . CORONARY ARTERY BYPASS GRAFT     with LIMA  to the LAD, SVG to OM1, SVG to PDA  . ESOPHAGOGASTRODUODENOSCOPY (EGD) WITH PROPOFOL N/A 01/16/2016   Procedure: ESOPHAGOGASTRODUODENOSCOPY (EGD) WITH PROPOFOL;  Surgeon: Manya Silvas, MD;  Location: [facility] ENDOSCOPY;  Service: Endoscopy;  Laterality: N/A;  . RENAL ARTERY STENT     left, By Dr Hulda Humphrey  . REPLACEMENT TOTAL KNEE  03/25/2013   right  . TONSILLECTOMY      Social History Social History   Tobacco Use  . Smoking status: Never  Smoker  . Smokeless tobacco: Never Used  Substance Use Topics  . Alcohol use: No  . Drug use: No     Family History Family History  Problem Relation Age of Onset  . Heart disease Father   . Other Brother        open heart surgery  . Cancer Brother   . Other Brother        open heart surgery  . Other Brother        open heart surgery  . Hyperlipidemia Daughter   . Breast cancer Neg Hx   . Kidney cancer Neg Hx   . Bladder Cancer Neg Hx      Allergies  Allergen Reactions  . Ciprofloxacin Other (See Comments)  . Codeine     Nausea   . Erythromycin Nausea And Vomiting  . Tramadol      REVIEW OF SYSTEMS (Negative unless checked)  Constitutional: [] Weight loss  [] Fever  [] Chills Cardiac: [] Chest pain   [] Chest pressure   [x] Palpitations   [] Shortness of breath when laying flat   [] Shortness of breath at rest   [x] Shortness of breath with exertion. Vascular:  [] Pain in legs with walking   [] Pain in legs at rest   [] Pain in legs when laying flat   [] Claudication   [] Pain in feet when walking  [] Pain in feet at rest  [] Pain in feet when laying flat   [] History of DVT   [] Phlebitis   [] Swelling in legs   [] Varicose veins   [] Non-healing ulcers Pulmonary:   [] Uses home oxygen   [] Productive cough   [] Hemoptysis   [] Wheeze  [] COPD   [] Asthma Neurologic:  [] Dizziness  [] Blackouts   [] Seizures   [] History of stroke   [] History of TIA  [] Aphasia   [] Temporary blindness   [] Dysphagia   [] Weakness or numbness in arms   [] Weakness or numbness in legs Musculoskeletal:  [x] Arthritis   [] Joint swelling   [] Joint pain   [] Low back pain Hematologic:  [] Easy bruising  [] Easy bleeding   [] Hypercoagulable state   [] Anemic   Gastrointestinal:  [] Blood in stool   [] Vomiting blood  [x] Gastroesophageal reflux/heartburn   [] Abdominal pain Genitourinary:  [] Chronic kidney disease   [] Difficult urination  [] Frequent urination  [] Burning with urination   [] Hematuria Skin:  [] Rashes   [] Ulcers    [] Wounds Psychological:  [] History of anxiety   []  History of major depression.  Physical Examination  BP (!) 113/59 (BP Location: Right Arm, Patient Position: Sitting)   Pulse (!) 59   Resp 13   Ht 5' 2.5" (1.588 m)   Wt 58.1 kg (128 lb)   BMI 23.04 kg/m  Gen:  WD/WN, NAD.  Appears younger than stated age  Head: [place]/AT, No temporalis wasting. Ear/Nose/Throat: Hearing grossly intact, nares w/o erythema or drainage, trachea midline Eyes: Conjunctiva clear. Sclera non-icteric Neck: Supple.  No JVD.  Pulmonary:  Good air movement, no use of accessory muscles.  Cardiac: Irregular Vascular:  Vessel Right Left  Radial Palpable Palpable                                    Musculoskeletal: M/S 5/5 throughout.  No deformity or atrophy.  No edema. Neurologic: Sensation grossly intact in extremities.  Symmetrical.  Speech is fluent.  Psychiatric: Judgment intact, Mood & affect appropriate for pt's clinical situation. Dermatologic: No rashes or ulcers noted.  No cellulitis or open wounds.       Labs Recent Results (from the past 2160 hour(s))  TSH     Status: None   Collection Time: 11/06/17 10:02 AM  Result Value Ref Range   TSH 2.96 0.35 - 4.50 uIU/mL  Vitamin B12     Status: None   Collection Time: 11/06/17 10:02 AM  Result Value Ref Range   Vitamin B-12 571 211 - 911 pg/mL  VITAMIN D 25 Hydroxy (Vit-D Deficiency, Fractures)     Status: None   Collection Time: 11/06/17 10:02 AM  Result Value Ref Range   VITD 52.66 30.00 - 100.00 ng/mL  Lipid panel     Status: Abnormal   Collection Time: 11/06/17 10:02 AM  Result Value Ref Range   Cholesterol 127 0 - 200 mg/dL    Comment: ATP III Classification       Desirable:  < 200 mg/dL               Borderline High:  200 - 239 mg/dL          High:  > = 240 mg/dL   Triglycerides 144.0 0.0 - 149.0 mg/dL    Comment: Normal:  <150 mg/dLBorderline High:  150 - 199 mg/dL   HDL 34.00 (L) >39.00 mg/dL   VLDL 28.8 0.0 - 40.0 mg/dL    LDL Cholesterol 64 0 - 99 mg/dL   Total CHOL/HDL Ratio 4     Comment:                Men          Women1/2 Average Risk     3.4          3.3Average Risk          5.0          4.42X Average Risk          9.6          7.13X Average Risk          15.0          11.0                       NonHDL 92.72     Comment: NOTE:  Non-HDL goal should be 30 mg/dL higher than patient's LDL goal (i.e. LDL goal of < 70 mg/dL, would have non-HDL goal of < 100 mg/dL)  Hemoglobin A1c     Status: Abnormal   Collection Time: 11/06/17 10:02 AM  Result Value Ref Range   Hgb A1c MFr Bld 7.5 (H) 4.6 - 6.5 %    Comment: Glycemic Control Guidelines for People with Diabetes:Non Diabetic:  <6%Goal of Therapy: <7%Additional Action Suggested:  >8%  Comprehensive metabolic panel     Status: Abnormal   Collection Time: 11/06/17 10:02 AM  Result Value Ref Range   Sodium 139 135 - 145 mEq/L   Potassium 4.3 3.5 - 5.1 mEq/L   Chloride 106 96 - 112 mEq/L   CO2 28 19 - 32 mEq/L   Glucose, Bld 144 (H) 70 - 99 mg/dL   BUN 29 (H) 6 - 23 mg/dL   Creatinine, Ser 1.30 (H) 0.40 - 1.20 mg/dL   Total Bilirubin 0.5 0.2 - 1.2 mg/dL   Alkaline Phosphatase 46 39 - 117 U/L   AST 20 0 - 37 U/L   ALT 11 0 - 35 U/L   Total Protein 6.7 6.0 - 8.3 g/dL   Albumin 4.2 3.5 - 5.2 g/dL   Calcium 10.4 8.4 - 10.5 mg/dL   GFR 41.97 (L) >60.00 mL/min  CBC with Differential/Platelet     Status: Abnormal   Collection Time: 11/06/17 10:02 AM  Result Value Ref Range   WBC 4.5 4.0 - 10.5 K/uL   RBC 3.72 (L) 3.87 - 5.11 Mil/uL   Hemoglobin 11.0 (L) 12.0 - 15.0 g/dL   HCT 32.3 (L) 36.0 - 46.0 %   MCV 87.0 78.0 - 100.0 fl   MCHC 33.9 30.0 - 36.0 g/dL   RDW 13.3 11.5 - 15.5 %   Platelets 178.0 150.0 - 400.0 K/uL   Neutrophils Relative % 53.9 43.0 - 77.0 %   Lymphocytes Relative 35.7 12.0 - 46.0 %   Monocytes Relative 7.7 3.0 - 12.0 %   Eosinophils Relative 2.2 0.0 - 5.0 %   Basophils Relative 0.5 0.0 - 3.0 %   Neutro Abs 2.4 1.4 - 7.7 K/uL   Lymphs  Abs 1.6 0.7 - 4.0 K/uL   Monocytes Absolute 0.3 0.1 - 1.0 K/uL   Eosinophils Absolute 0.1 0.0 - 0.7 K/uL   Basophils Absolute 0.0 0.0 - 0.1 K/uL  Urinalysis Dipstick     Status: Abnormal   Collection Time: 11/06/17 10:23 AM  Result Value Ref Range   Color, UA yellow    Clarity, UA cloudy    Glucose, UA negative    Bilirubin, UA negative    Ketones, UA negative    Spec Grav, UA 1.025 1.010 - 1.025   Blood, UA 1+     Comment: 25 Ery/uL   pH, UA 6.0 5.0 - 8.0   Protein, UA 2+     Comment: 100 mg/dL   Urobilinogen, UA 0.2 0.2 or 1.0 E.U./dL   Nitrite, UA negative    Leukocytes, UA Negative Negative   Appearance yellow    Odor present   Urine Culture     Status: None   Collection Time: 11/06/17  2:57 PM  Result Value Ref Range   MICRO NUMBER: [id number]    SPECIMEN QUALITY: ADEQUATE    Sample Source NOT GIVEN    STATUS: FINAL    Result: No Growth     Radiology Mm Digital Screening Bilateral  Result Date: 12/09/2017 CLINICAL DATA:  Screening. EXAM: DIGITAL SCREENING BILATERAL MAMMOGRAM WITH CAD COMPARISON:  Previous exam(s). ACR Breast Density Category b: There are scattered areas of fibroglandular density. FINDINGS: There are no findings suspicious for malignancy. Images were processed with CAD. IMPRESSION: No mammographic evidence of malignancy. A result letter of this screening mammogram will be mailed directly to the patient. RECOMMENDATION: Screening mammogram in one year. (Code:SM-B-01Y) BI-RADS CATEGORY  1: Negative. Electronically Signed   By: Curlene Dolphin M.D.   On:  12/09/2017 16:14     Assessment/Plan  Hypertension blood pressure control important in reducing the progression of atherosclerotic disease. On appropriate oral medications.   Diabetes mellitus blood glucose control important in reducing the progression of atherosclerotic disease. Also, involved in wound healing. On appropriate medications.   Renal artery stenosis Over 10 years status post  intervention. Her duplex today shows normal velocities in both kidneys with some elevated velocities in the mid infrarenal aorta. At this point, continuing to follow it every other year seems reasonable.    [name], MD  12/13/2017 11:06 AM    This note was created with Dragon medical transcription system.  Any errors from dictation are purely unintentional

## 2017-12-13 NOTE — Assessment & Plan Note (Signed)
blood glucose control important in reducing the progression of atherosclerotic disease. Also, involved in wound healing. On appropriate medications.  

## 2017-12-13 NOTE — Assessment & Plan Note (Signed)
Over 10 years status post intervention. Her duplex today shows normal velocities in both kidneys with some elevated velocities in the mid infrarenal aorta. At this point, continuing to follow it every other year seems reasonable.

## 2017-12-29 ENCOUNTER — Other Ambulatory Visit: Payer: Self-pay | Admitting: Cardiovascular Disease

## 2018-01-09 ENCOUNTER — Ambulatory Visit (INDEPENDENT_AMBULATORY_CARE_PROVIDER_SITE_OTHER): Payer: Medicare HMO | Admitting: Obstetrics and Gynecology

## 2018-01-09 ENCOUNTER — Encounter: Payer: Self-pay | Admitting: Obstetrics and Gynecology

## 2018-01-09 VITALS — BP 141/64 | HR 62 | Ht 63.0 in | Wt 128.6 lb

## 2018-01-09 DIAGNOSIS — Z01419 Encounter for gynecological examination (general) (routine) without abnormal findings: Secondary | ICD-10-CM | POA: Diagnosis not present

## 2018-01-09 NOTE — Progress Notes (Signed)
Subjective:   Tracey [name] is a 82 y.o. G53P4 Caucasian female here for a routine well-woman exam.  No LMP recorded. Patient is postmenopausal.    Current complaints: losing weight PCP: [facility]       doesn't desire labs  Social History: Sexual: heterosexual Marital Status: married Living situation: with spouse Occupation: retired Tobacco/alcohol: no tobacco use Illicit drugs: no history of illicit drug use  The following portions of the patient's history were reviewed and updated as appropriate: allergies, current medications, past family history, past medical history, past social history, past surgical history and problem list.  Past Medical History Past Medical History:  Diagnosis Date  . Anxiety   . Atrial fibrillation ([place])    history  . Coronary artery disease   . Diabetes mellitus    non insulin dependent  . GERD (gastroesophageal reflux disease)   . Heart murmur   . Hyperlipidemia   . Hypertension   . Peripheral vascular disease ([place])   . UTI (urinary tract infection)     Past Surgical History Past Surgical History:  Procedure Laterality Date  . APPENDECTOMY    . BREAST BIOPSY Left    20 years ago  . BREAST BIOPSY Right 2008  . BREAST EXCISIONAL BIOPSY     rt 2008 lt "20 years ago"  . CARDIAC CATHETERIZATION    . CATARACT EXTRACTION W/PHACO Left 08/15/2015   Procedure: CATARACT EXTRACTION PHACO AND INTRAOCULAR LENS PLACEMENT (IOC);  Surgeon: Estill Cotta, MD;  Location: [facility] ORS;  Service: Ophthalmology;  Laterality: Left;  [place] 01:28 AP% 26.4 CDE 35.73 fluid pack # [id number]  . COLONOSCOPY WITH PROPOFOL N/A 01/16/2016   Procedure: COLONOSCOPY WITH PROPOFOL;  Surgeon: Manya Silvas, MD;  Location: [facility] ENDOSCOPY;  Service: Endoscopy;  Laterality: N/A;  . CORONARY ANGIOPLASTY WITH STENT PLACEMENT    . CORONARY ARTERY BYPASS GRAFT     with LIMA  to the LAD, SVG to OM1, SVG to PDA  . ESOPHAGOGASTRODUODENOSCOPY (EGD) WITH PROPOFOL N/A 01/16/2016   Procedure:  ESOPHAGOGASTRODUODENOSCOPY (EGD) WITH PROPOFOL;  Surgeon: Manya Silvas, MD;  Location: [facility] ENDOSCOPY;  Service: Endoscopy;  Laterality: N/A;  . RENAL ARTERY STENT     left, By Dr Hulda Humphrey  . REPLACEMENT TOTAL KNEE  03/25/2013   right  . TONSILLECTOMY      Gynecologic History G4P4  No LMP recorded. Patient is postmenopausal. Contraception: post menopausal status Last Pap: ?Marland Kitchen Results were: normal Last mammogram: 11/2017. Results were: normal   Obstetric History OB History  Gravida Para Term Preterm AB Living  4 4          SAB TAB Ectopic Multiple Live Births               # Outcome Date GA Lbr Len/2nd Weight Sex Delivery Anes PTL Lv  4 Para 1969    M Vag-Spont     3 Para 1960    F Vag-Spont     2 Para 1957    F Vag-Spont     1 Para 1956    F Vag-Spont       Current Medications Current Outpatient Medications on File Prior to Visit  Medication Sig Dispense Refill  . aspirin 81 MG tablet Take 81 mg by mouth daily.     . clopidogrel (PLAVIX) 75 MG tablet TAKE 1 TABLET BY MOUTH EVERY DAY 90 tablet 3  . cyanocobalamin (,VITAMIN B-12,) 1000 MCG/ML injection INJECT 1ML INTRAMUSCULARLY ONCE MONTHLY AS DIRECTED 1 mL 11  . glucose  blood (ONE TOUCH TEST STRIPS) test strip USE AS DIRECTED TO CHECK BLOOD SUGAR ONCE DAILY E08.00 100 each 2  . lisinopril (PRINIVIL,ZESTRIL) 20 MG tablet Take 1 tablet (20 mg total) by mouth daily. 90 tablet 3  . metFORMIN (GLUCOPHAGE) 1000 MG tablet Take 1 tablet (1,000 mg total) by mouth 2 (two) times daily. 180 tablet 3  . metoprolol succinate (TOPROL-XL) 25 MG 24 hr tablet Take 1 tablet (25 mg total) by mouth daily. 90 tablet 3  . Multiple Vitamins-Minerals (MULTIVITAMIN WITH MINERALS) tablet Take 1 tablet by mouth daily.    [name] LANCETS 40C MISC USE AS INSTRUCTED TO TEST BLOOD SUGAR ONCE DAILY E08.00 100 each 2  . pantoprazole (PROTONIX) 40 MG tablet Take 1 tablet (40 mg total) by mouth daily. 90 tablet 2  . rosuvastatin (CRESTOR) 40 MG tablet  TAKE 1 TABLET BY MOUTH EVERY DAY 30 tablet 0  . mirtazapine (REMERON) 30 MG tablet Take 1 tablet (30 mg total) by mouth at bedtime. 90 tablet 3  . nitroGLYCERIN (NITROSTAT) 0.4 MG SL tablet Place 1 tablet (0.4 mg total) under the tongue every 5 (five) minutes as needed for chest pain. 25 tablet 6  . Vitamin D, Ergocalciferol, (DRISDOL) 50000 units CAPS capsule Take 1 capsule (50,000 Units total) by mouth every 7 (seven) days. (Patient not taking: Reported on 01/09/2018) 12 capsule 0   No current facility-administered medications on file prior to visit.     Review of Systems Patient denies any headaches, blurred vision, shortness of breath, chest pain, abdominal pain, problems with bowel movements, urination, or intercourse.  Objective:  BP (!) 141/64   Pulse 62   Ht 5\' 3"  (1.6 m)   Wt 128 lb 9.6 oz (58.3 kg)   BMI 22.78 kg/m  Physical Exam  General:  Well developed, well nourished, no acute distress. She is alert and oriented x3. Skin:  Warm and dry Neck:  Midline trachea, no thyromegaly or nodules Cardiovascular: Regular rate and rhythm, no murmur heard Lungs:  Effort normal, all lung fields clear to auscultation bilaterally Breasts:  No dominant palpable mass, retraction, or nipple discharge Abdomen:  Soft, non tender, no hepatosplenomegaly or masses Pelvic:  External genitalia is normal in appearance.  The vagina is normal in appearance. The cervix is bulbous, no CMT.  Thin prep pap is not done . Uterus is felt to be normal size, shape, and contour.  No adnexal masses or tenderness noted. Extremities:  No swelling or varicosities noted Psych:  She has a normal mood and affect  Assessment:   Healthy well-woman exam  Plan:   F/U 1 year for AE, or sooner if needed  Colonoscopy done 2017, due five years later  Jeptha Hinnenkamp Rockney Ghee, CNM

## 2018-01-13 ENCOUNTER — Encounter: Payer: Self-pay | Admitting: Family Medicine

## 2018-01-13 ENCOUNTER — Ambulatory Visit (INDEPENDENT_AMBULATORY_CARE_PROVIDER_SITE_OTHER): Payer: Medicare HMO | Admitting: Family Medicine

## 2018-01-13 VITALS — BP 118/58 | HR 79 | Temp 97.5°F | Ht 62.5 in | Wt 128.2 lb

## 2018-01-13 DIAGNOSIS — E0800 Diabetes mellitus due to underlying condition with hyperosmolarity without nonketotic hyperglycemic-hyperosmolar coma (NKHHC): Secondary | ICD-10-CM | POA: Diagnosis not present

## 2018-01-13 DIAGNOSIS — R634 Abnormal weight loss: Secondary | ICD-10-CM | POA: Diagnosis not present

## 2018-01-13 DIAGNOSIS — N289 Disorder of kidney and ureter, unspecified: Secondary | ICD-10-CM | POA: Diagnosis not present

## 2018-01-13 DIAGNOSIS — R197 Diarrhea, unspecified: Secondary | ICD-10-CM

## 2018-01-13 MED ORDER — GLIPIZIDE ER 5 MG PO TB24: 5.0000 mg | ORAL_TABLET | Freq: Every day | ORAL | 11 refills | Status: DC

## 2018-01-13 MED ORDER — METFORMIN HCL ER 500 MG PO TB24: 500.0000 mg | ORAL_TABLET | Freq: Every day | ORAL | 11 refills | Status: DC

## 2018-01-13 NOTE — Progress Notes (Signed)
Subjective:    Patient ID: Tracey [name], female    DOB: 02/08/1936, 82 y.o.   MRN: [id number]  HPI  Here for concerns about weight loss and elevated blood sugar   Wt Readings from Last 3 Encounters:  01/13/18 128 lb 4 oz (58.2 kg)  01/09/18 128 lb 9.6 oz (58.3 kg)  12/13/17 128 lb (58.1 kg)  not much appetite and has to force herself to eat  Was in 140s before her husband died-then lost since then  Has never been a big eater  23.08 kg/m   When she does eat- tries to eat healthy  Makes effort to get protein and vegetables  Zero appetite in am-cannot eat much at all  Will make egg salad for later in the day   Does not eat much bread  Eats fruit  Some cereal lately -cheerios  occ glass of milk   Not depressed at all  Has a new partner as well - very happy with that  [place]   Lab Results  Component Value Date   HGBA1C 7.5 (H) 11/06/2017   Taking metformin and having diarrhea  Certain things bring it on  Readings are never over 178 for the most part with one exception (ate lot of pasta at [employer] -then dropped by night)  No low glucose readings   Has never been on glipizide    Takes mirtazapine for sleep and appetite since her husband died    bp is stable today  No cp or palpitations or headaches or edema  No side effects to medicines  BP Readings from Last 3 Encounters:  01/13/18 (!) 118/58  01/09/18 (!) 141/64  12/13/17 (!) 113/59     Cr was up last time Lab Results  Component Value Date   CREATININE 1.30 (H) 11/06/2017   BUN 29 (H) 11/06/2017   NA 139 11/06/2017   K 4.3 11/06/2017   CL 106 11/06/2017   CO2 28 11/06/2017    Patient Active Problem List   Diagnosis Date Noted  . Weight loss 01/13/2018  . Atrial fibrillation ([place]) 11/11/2017  . Routine general medical examination at a health care facility 11/11/2017  . Renal insufficiency 11/11/2017  . Osteopenia 04/30/2017  . Anemia 02/12/2017  . Vitamin D deficiency 02/03/2017    . Well woman exam 11/05/2016  . Vitamin B12 deficiency 11/05/2016  . Depression 04/19/2016  . Fatigue 02/06/2016  . Frequent loose stools 01/05/2016  . Angina pectoris ([place]) 08/26/2013  . Hypertension 01/22/2012  . Coronary heart disease 01/22/2012  . S/P CABG x 3 01/22/2012  . Hyperlipidemia 01/22/2012  . Diabetes mellitus ([place]) 01/22/2012  . Renal artery stenosis ([place]) 01/22/2012   Past Medical History:  Diagnosis Date  . Anxiety   . Atrial fibrillation ([place])    history  . Coronary artery disease   . Diabetes mellitus    non insulin dependent  . GERD (gastroesophageal reflux disease)   . Heart murmur   . Hyperlipidemia   . Hypertension   . Peripheral vascular disease ([place])   . UTI (urinary tract infection)    Past Surgical History:  Procedure Laterality Date  . APPENDECTOMY    . BREAST BIOPSY Left    20 years ago  . BREAST BIOPSY Right 2008  . BREAST EXCISIONAL BIOPSY     rt 2008 lt "20 years ago"  . CARDIAC CATHETERIZATION    . CATARACT EXTRACTION W/PHACO Left 08/15/2015   Procedure: CATARACT EXTRACTION PHACO AND INTRAOCULAR  LENS PLACEMENT (IOC);  Surgeon: Estill Cotta, MD;  Location: [facility] ORS;  Service: Ophthalmology;  Laterality: Left;  [place] 01:28 AP% 26.4 CDE 35.73 fluid pack # [id number]  . COLONOSCOPY WITH PROPOFOL N/A 01/16/2016   Procedure: COLONOSCOPY WITH PROPOFOL;  Surgeon: Manya Silvas, MD;  Location: [facility] ENDOSCOPY;  Service: Endoscopy;  Laterality: N/A;  . CORONARY ANGIOPLASTY WITH STENT PLACEMENT    . CORONARY ARTERY BYPASS GRAFT     with LIMA  to the LAD, SVG to OM1, SVG to PDA  . ESOPHAGOGASTRODUODENOSCOPY (EGD) WITH PROPOFOL N/A 01/16/2016   Procedure: ESOPHAGOGASTRODUODENOSCOPY (EGD) WITH PROPOFOL;  Surgeon: Manya Silvas, MD;  Location: [facility] ENDOSCOPY;  Service: Endoscopy;  Laterality: N/A;  . RENAL ARTERY STENT     left, By Dr Hulda Humphrey  . REPLACEMENT TOTAL KNEE  03/25/2013   right  . TONSILLECTOMY     Social History   Tobacco Use  .  Smoking status: Never Smoker  . Smokeless tobacco: Never Used  Substance Use Topics  . Alcohol use: No  . Drug use: No   Family History  Problem Relation Age of Onset  . Heart disease Father   . Other Brother        open heart surgery  . Cancer Brother   . Other Brother        open heart surgery  . Other Brother        open heart surgery  . Hyperlipidemia Daughter   . Breast cancer Neg Hx   . Kidney cancer Neg Hx   . Bladder Cancer Neg Hx    Allergies  Allergen Reactions  . Ciprofloxacin Other (See Comments)  . Codeine     Nausea   . Erythromycin Nausea And Vomiting  . Tramadol    Current Outpatient Medications on File Prior to Visit  Medication Sig Dispense Refill  . aspirin 81 MG tablet Take 81 mg by mouth daily.     . clopidogrel (PLAVIX) 75 MG tablet TAKE 1 TABLET BY MOUTH EVERY DAY 90 tablet 3  . cyanocobalamin (,VITAMIN B-12,) 1000 MCG/ML injection INJECT 1ML INTRAMUSCULARLY ONCE MONTHLY AS DIRECTED 1 mL 11  . glucose blood (ONE TOUCH TEST STRIPS) test strip USE AS DIRECTED TO CHECK BLOOD SUGAR ONCE DAILY E08.00 100 each 2  . lisinopril (PRINIVIL,ZESTRIL) 20 MG tablet Take 1 tablet (20 mg total) by mouth daily. 90 tablet 3  . metoprolol succinate (TOPROL-XL) 25 MG 24 hr tablet Take 1 tablet (25 mg total) by mouth daily. 90 tablet 3  . mirtazapine (REMERON) 30 MG tablet Take 1 tablet (30 mg total) by mouth at bedtime. 90 tablet 3  . Multiple Vitamins-Minerals (MULTIVITAMIN WITH MINERALS) tablet Take 1 tablet by mouth daily.    [name] LANCETS 38B MISC USE AS INSTRUCTED TO TEST BLOOD SUGAR ONCE DAILY E08.00 100 each 2  . pantoprazole (PROTONIX) 40 MG tablet Take 1 tablet (40 mg total) by mouth daily. 90 tablet 2  . rosuvastatin (CRESTOR) 40 MG tablet TAKE 1 TABLET BY MOUTH EVERY DAY 30 tablet 0  . nitroGLYCERIN (NITROSTAT) 0.4 MG SL tablet Place 1 tablet (0.4 mg total) under the tongue every 5 (five) minutes as needed for chest pain. 25 tablet 6   No current  facility-administered medications on file prior to visit.      Review of Systems  Constitutional: Positive for unexpected weight change. Negative for activity change, appetite change, fatigue and fever.  HENT: Negative for congestion, ear pain, rhinorrhea, sinus pressure and  sore throat.   Eyes: Negative for pain, redness and visual disturbance.  Respiratory: Negative for cough, shortness of breath and wheezing.   Cardiovascular: Negative for chest pain and palpitations.  Gastrointestinal: Negative for abdominal pain, blood in stool, constipation and diarrhea.  Endocrine: Negative for polydipsia and polyuria.  Genitourinary: Negative for dysuria, frequency and urgency.  Musculoskeletal: Negative for arthralgias, back pain and myalgias.  Skin: Negative for pallor and rash.  Allergic/Immunologic: Negative for environmental allergies.  Neurological: Negative for dizziness, syncope and headaches.  Hematological: Negative for adenopathy. Does not bruise/bleed easily.  Psychiatric/Behavioral: Negative for decreased concentration and dysphoric mood. The patient is not nervous/anxious.        Objective:   Physical Exam  Constitutional: She appears well-developed and well-nourished. No distress.  HENT:  Head: Normocephalic and atraumatic.  Mouth/Throat: Oropharynx is clear and moist.  Eyes: Pupils are equal, round, and reactive to light. Conjunctivae and EOM are normal.  Neck: Normal range of motion. Neck supple. No JVD present. Carotid bruit is not present. No thyromegaly present.  Cardiovascular: Normal rate, regular rhythm, normal heart sounds and intact distal pulses. Exam reveals no gallop.  Pulmonary/Chest: Effort normal and breath sounds normal. No respiratory distress. She has no wheezes. She has no rales.  No crackles  Abdominal: Soft. Bowel sounds are normal. She exhibits no distension, no abdominal bruit and no mass. There is no tenderness.  Musculoskeletal: She exhibits no edema.    Lymphadenopathy:    She has no cervical adenopathy.  Neurological: She is alert. She has normal reflexes.  Skin: Skin is warm and dry. No rash noted.  Psychiatric: She has a normal mood and affect.  Mood is good           Assessment & Plan:   Problem List Items Addressed This Visit      Endocrine   Diabetes mellitus ([place]) - Primary    Glucose control is worsening and pt has lost wt  Also metformin may be cause of diarrhea (also inc cr) Will change to 500 mg metformin xr once daily  Add glipizide 5 mg xl daily  Disc risks of hypoglycemia -need to eat regular meals/she voiced understanding  Continue to watch glucose levels- will likely have to inc dose later F/u end of May as planned Continue good diabetic eating        Relevant Medications   glipiZIDE (GLUCOTROL XL) 5 MG 24 hr tablet   metFORMIN (GLUCOPHAGE-XR) 500 MG 24 hr tablet     Genitourinary   Renal insufficiency    Lower dose of metformin to the XR 500 mg daily  Lab next mo as planned Continue good water intake         Other   Frequent loose stools    Suspect due to metformin (also poss IBS) Will change to metformin xr at small dose of 500 mg daily  F/u late may as planned Alert if symptoms change or worsen      Weight loss    Suspect multifactorial Generally small appetite (continue mirtazapine) High dose of metformin (reduce)  DM not controlled   Disc diet goals Change DM tx F/u in late may

## 2018-01-13 NOTE — Assessment & Plan Note (Signed)
Suspect multifactorial Generally small appetite (continue mirtazapine) High dose of metformin (reduce)  DM not controlled   Disc diet goals Change DM tx F/u in late may

## 2018-01-13 NOTE — Patient Instructions (Addendum)
Continue to eat healthy / get your protein and don't skip meals   Stop metformin 1000  Change to metformin xr 500 mg one daily  Add glipizide 5 mg ER once daily  Both in the am   Follow up end of may as planned   Call if any concerns Take care of yourself

## 2018-01-13 NOTE — Assessment & Plan Note (Signed)
Suspect due to metformin (also poss IBS) Will change to metformin xr at small dose of 500 mg daily  F/u late may as planned Alert if symptoms change or worsen

## 2018-01-13 NOTE — Assessment & Plan Note (Signed)
Lower dose of metformin to the XR 500 mg daily  Lab next mo as planned Continue good water intake

## 2018-01-13 NOTE — Assessment & Plan Note (Signed)
Glucose control is worsening and pt has lost wt  Also metformin may be cause of diarrhea (also inc cr) Will change to 500 mg metformin xr once daily  Add glipizide 5 mg xl daily  Disc risks of hypoglycemia -need to eat regular meals/she voiced understanding  Continue to watch glucose levels- will likely have to inc dose later F/u end of May as planned Continue good diabetic eating

## 2018-01-28 ENCOUNTER — Other Ambulatory Visit: Payer: Self-pay | Admitting: Cardiovascular Disease

## 2018-01-28 DIAGNOSIS — R69 Illness, unspecified: Secondary | ICD-10-CM | POA: Diagnosis not present

## 2018-02-11 ENCOUNTER — Ambulatory Visit: Payer: Medicare HMO | Admitting: Family Medicine

## 2018-02-11 ENCOUNTER — Other Ambulatory Visit (INDEPENDENT_AMBULATORY_CARE_PROVIDER_SITE_OTHER): Payer: Medicare HMO

## 2018-02-11 DIAGNOSIS — D649 Anemia, unspecified: Secondary | ICD-10-CM

## 2018-02-11 DIAGNOSIS — E0800 Diabetes mellitus due to underlying condition with hyperosmolarity without nonketotic hyperglycemic-hyperosmolar coma (NKHHC): Secondary | ICD-10-CM

## 2018-02-11 LAB — COMPREHENSIVE METABOLIC PANEL
ALT: 11 U/L (ref 0–35)
AST: 20 U/L (ref 0–37)
Albumin: 4.2 g/dL (ref 3.5–5.2)
Alkaline Phosphatase: 39 U/L (ref 39–117)
BILIRUBIN TOTAL: 0.5 mg/dL (ref 0.2–1.2)
BUN: 35 mg/dL — ABNORMAL HIGH (ref 6–23)
CALCIUM: 10.2 mg/dL (ref 8.4–10.5)
CHLORIDE: 107 meq/L (ref 96–112)
CO2: 27 meq/L (ref 19–32)
Creatinine, Ser: 1.01 mg/dL (ref 0.40–1.20)
GFR: 56.13 mL/min — AB (ref >60.00)
GLUCOSE: 150 mg/dL — AB (ref 70–99)
Potassium: 4.3 mEq/L (ref 3.5–5.1)
Sodium: 142 mEq/L (ref 135–145)
Total Protein: 6.9 g/dL (ref 6.0–8.3)

## 2018-02-11 LAB — CBC WITH DIFFERENTIAL/PLATELET
BASOS PCT: 0.6 % (ref 0.0–3.0)
Basophils Absolute: 0 10*3/uL (ref 0.0–0.1)
EOS PCT: 1.3 % (ref 0.0–5.0)
Eosinophils Absolute: 0.1 10*3/uL (ref 0.0–0.7)
HEMATOCRIT: 29.8 % — AB (ref 36.0–46.0)
HEMOGLOBIN: 9.8 g/dL — AB (ref 12.0–15.0)
LYMPHS PCT: 28 % (ref 12.0–46.0)
Lymphs Abs: 1.4 10*3/uL (ref 0.7–4.0)
MCHC: 33.1 g/dL (ref 30.0–36.0)
MCV: 87.1 fl (ref 78.0–100.0)
MONOS PCT: 7.7 % (ref 3.0–12.0)
Monocytes Absolute: 0.4 10*3/uL (ref 0.1–1.0)
NEUTROS ABS: 3.1 10*3/uL (ref 1.4–7.7)
Neutrophils Relative %: 62.4 % (ref 43.0–77.0)
PLATELETS: 164 10*3/uL (ref 150.0–400.0)
RBC: 3.42 Mil/uL — ABNORMAL LOW (ref 3.87–5.11)
RDW: 13.2 % (ref 11.5–15.5)
WBC: 4.9 10*3/uL (ref 4.0–10.5)

## 2018-02-11 LAB — FERRITIN: Ferritin: 7.8 ng/mL — ABNORMAL LOW (ref 10.0–291.0)

## 2018-02-11 LAB — HEMOGLOBIN A1C: Hgb A1c MFr Bld: 7.5 % — ABNORMAL HIGH (ref 4.6–6.5)

## 2018-02-14 ENCOUNTER — Encounter: Payer: Self-pay | Admitting: Family Medicine

## 2018-02-14 ENCOUNTER — Ambulatory Visit (INDEPENDENT_AMBULATORY_CARE_PROVIDER_SITE_OTHER): Payer: Medicare HMO | Admitting: Family Medicine

## 2018-02-14 VITALS — BP 122/62 | HR 57 | Temp 97.6°F | Ht 62.5 in | Wt 131.0 lb

## 2018-02-14 DIAGNOSIS — N289 Disorder of kidney and ureter, unspecified: Secondary | ICD-10-CM | POA: Diagnosis not present

## 2018-02-14 DIAGNOSIS — I10 Essential (primary) hypertension: Secondary | ICD-10-CM

## 2018-02-14 DIAGNOSIS — D649 Anemia, unspecified: Secondary | ICD-10-CM | POA: Diagnosis not present

## 2018-02-14 DIAGNOSIS — R634 Abnormal weight loss: Secondary | ICD-10-CM

## 2018-02-14 DIAGNOSIS — E0800 Diabetes mellitus due to underlying condition with hyperosmolarity without nonketotic hyperglycemic-hyperosmolar coma (NKHHC): Secondary | ICD-10-CM

## 2018-02-14 MED ORDER — POLYSACCHARIDE IRON COMPLEX 150 MG PO CAPS: 150.0000 mg | ORAL_CAPSULE | Freq: Every day | ORAL | 3 refills | Status: DC

## 2018-02-14 MED ORDER — GLIPIZIDE ER 2.5 MG PO TB24: 2.5000 mg | ORAL_TABLET | Freq: Every day | ORAL | 3 refills | Status: DC

## 2018-02-14 NOTE — Patient Instructions (Addendum)
Decrease glipizide ER to 2.5 mg  If low sugar levels persist- call and let me know   Start iron daily   Follow up in 3 months please   If any problems let me know

## 2018-02-14 NOTE — Progress Notes (Signed)
Subjective:    Patient ID: Tracey [name], female    DOB: 08-Feb-1936, 82 y.o.   MRN: [id number]  HPI Here for f/u of chronic medical problems   Wt Readings from Last 3 Encounters:  02/14/18 131 lb (59.4 kg)  01/13/18 128 lb 4 oz (58.2 kg)  01/09/18 128 lb 9.6 oz (58.3 kg)  appetite is improved - maybe eating a bit more  23.58 kg/m   bp is stable today  No cp or palpitations or headaches or edema  No side effects to medicines  BP Readings from Last 3 Encounters:  02/14/18 122/62  01/13/18 (!) 118/58  01/09/18 (!) 141/64     Cr was elevated last check  Did dec metformin dose Lab Results  Component Value Date   CREATININE 1.01 02/11/2018   BUN 35 (H) 02/11/2018   NA 142 02/11/2018   K 4.3 02/11/2018   CL 107 02/11/2018   CO2 27 02/11/2018  drinking lots of water  Improved  Lab Results  Component Value Date   ALT 11 02/11/2018   AST 20 02/11/2018   ALKPHOS 39 02/11/2018   BILITOT 0.5 02/11/2018     DM2 Had diarrhea so dec metformin and changed to xr 500 once daily  Also added glipizide 5 xl  Lab Results  Component Value Date   HGBA1C 7.5 (H) 02/11/2018  has had 2 episodes of hypoglycemia (symptomatic) -one level 42  Trying to eat 3 meals and 2-3 small snacks per day  Sugar am- one teens  Pm- random in 160s Definitely better  This is unchanged  Had only just changed doses  Diarrhea has stopped !   Anemia/chronic Lab Results  Component Value Date   WBC 4.9 02/11/2018   HGB 9.8 (L) 02/11/2018   HCT 29.8 (L) 02/11/2018   MCV 87.1 02/11/2018   PLT 164.0 02/11/2018   hb down from 11  Ferritin 7.8 down from 13.4 Per pt - GI work ups are normal  No abd pain or blood in stool  Colonoscopy 2017 5 y recall one plyp  egd-non erosive gastropathy  Fecal blood tests neg    Patient Active Problem List   Diagnosis Date Noted  . Weight loss 01/13/2018  . Atrial fibrillation ([place]) 11/11/2017  . Routine general medical examination at a health care facility  11/11/2017  . Renal insufficiency 11/11/2017  . Osteopenia 04/30/2017  . Anemia 02/12/2017  . Vitamin D deficiency 02/03/2017  . Well woman exam 11/05/2016  . Vitamin B12 deficiency 11/05/2016  . Depression 04/19/2016  . Fatigue 02/06/2016  . Frequent loose stools 01/05/2016  . Angina pectoris ([place]) 08/26/2013  . Hypertension 01/22/2012  . Coronary heart disease 01/22/2012  . S/P CABG x 3 01/22/2012  . Hyperlipidemia 01/22/2012  . Diabetes mellitus ([place]) 01/22/2012  . Renal artery stenosis ([place]) 01/22/2012   Past Medical History:  Diagnosis Date  . Anxiety   . Atrial fibrillation ([place])    history  . Coronary artery disease   . Diabetes mellitus    non insulin dependent  . GERD (gastroesophageal reflux disease)   . Heart murmur   . Hyperlipidemia   . Hypertension   . Peripheral vascular disease ([place])   . UTI (urinary tract infection)    Past Surgical History:  Procedure Laterality Date  . APPENDECTOMY    . BREAST BIOPSY Left    20 years ago  . BREAST BIOPSY Right 2008  . BREAST EXCISIONAL BIOPSY     rt 2008 lt "  20 years ago"  . CARDIAC CATHETERIZATION    . CATARACT EXTRACTION W/PHACO Left 08/15/2015   Procedure: CATARACT EXTRACTION PHACO AND INTRAOCULAR LENS PLACEMENT (IOC);  Surgeon: Estill Cotta, MD;  Location: [facility] ORS;  Service: Ophthalmology;  Laterality: Left;  [place] 01:28 AP% 26.4 CDE 35.73 fluid pack # [id number]  . COLONOSCOPY WITH PROPOFOL N/A 01/16/2016   Procedure: COLONOSCOPY WITH PROPOFOL;  Surgeon: Manya Silvas, MD;  Location: [facility] ENDOSCOPY;  Service: Endoscopy;  Laterality: N/A;  . CORONARY ANGIOPLASTY WITH STENT PLACEMENT    . CORONARY ARTERY BYPASS GRAFT     with LIMA  to the LAD, SVG to OM1, SVG to PDA  . ESOPHAGOGASTRODUODENOSCOPY (EGD) WITH PROPOFOL N/A 01/16/2016   Procedure: ESOPHAGOGASTRODUODENOSCOPY (EGD) WITH PROPOFOL;  Surgeon: Manya Silvas, MD;  Location: [facility] ENDOSCOPY;  Service: Endoscopy;  Laterality: N/A;  . RENAL ARTERY STENT      left, By Dr Hulda Humphrey  . REPLACEMENT TOTAL KNEE  03/25/2013   right  . TONSILLECTOMY     Social History   Tobacco Use  . Smoking status: Never Smoker  . Smokeless tobacco: Never Used  Substance Use Topics  . Alcohol use: No  . Drug use: No   Family History  Problem Relation Age of Onset  . Heart disease Father   . Other Brother        open heart surgery  . Cancer Brother   . Other Brother        open heart surgery  . Other Brother        open heart surgery  . Hyperlipidemia Daughter   . Breast cancer Neg Hx   . Kidney cancer Neg Hx   . Bladder Cancer Neg Hx    Allergies  Allergen Reactions  . Ciprofloxacin Other (See Comments)  . Codeine     Nausea   . Erythromycin Nausea And Vomiting  . Tramadol    Current Outpatient Medications on File Prior to Visit  Medication Sig Dispense Refill  . aspirin 81 MG tablet Take 81 mg by mouth daily.     . clopidogrel (PLAVIX) 75 MG tablet TAKE 1 TABLET BY MOUTH EVERY DAY 90 tablet 3  . cyanocobalamin (,VITAMIN B-12,) 1000 MCG/ML injection INJECT 1ML INTRAMUSCULARLY ONCE MONTHLY AS DIRECTED 1 mL 11  . glucose blood (ONE TOUCH TEST STRIPS) test strip USE AS DIRECTED TO CHECK BLOOD SUGAR ONCE DAILY E08.00 100 each 2  . lisinopril (PRINIVIL,ZESTRIL) 20 MG tablet Take 1 tablet (20 mg total) by mouth daily. 90 tablet 3  . metFORMIN (GLUCOPHAGE-XR) 500 MG 24 hr tablet Take 1 tablet (500 mg total) by mouth daily with breakfast. 30 tablet 11  . metoprolol succinate (TOPROL-XL) 25 MG 24 hr tablet Take 1 tablet (25 mg total) by mouth daily. 90 tablet 3  . mirtazapine (REMERON) 30 MG tablet Take 1 tablet (30 mg total) by mouth at bedtime. 90 tablet 3  . Multiple Vitamins-Minerals (MULTIVITAMIN WITH MINERALS) tablet Take 1 tablet by mouth daily.    [name] LANCETS 64P MISC USE AS INSTRUCTED TO TEST BLOOD SUGAR ONCE DAILY E08.00 100 each 2  . pantoprazole (PROTONIX) 40 MG tablet Take 1 tablet (40 mg total) by mouth daily. 90 tablet 2  .  rosuvastatin (CRESTOR) 40 MG tablet TAKE 1 TABLET BY MOUTH EVERY DAY 90 tablet 3  . nitroGLYCERIN (NITROSTAT) 0.4 MG SL tablet Place 1 tablet (0.4 mg total) under the tongue every 5 (five) minutes as needed for chest pain. 25 tablet  6   No current facility-administered medications on file prior to visit.     Review of Systems  Constitutional: Negative for activity change, appetite change, fatigue, fever and unexpected weight change.  HENT: Negative for congestion, rhinorrhea, sore throat and trouble swallowing.   Eyes: Negative for pain, redness, itching and visual disturbance.  Respiratory: Negative for cough, chest tightness, shortness of breath and wheezing.   Cardiovascular: Negative for chest pain and palpitations.  Gastrointestinal: Negative for abdominal pain, blood in stool, constipation, diarrhea and nausea.  Endocrine: Negative for cold intolerance, heat intolerance, polydipsia and polyuria.  Genitourinary: Negative for difficulty urinating, dysuria, frequency and urgency.  Musculoskeletal: Negative for arthralgias, joint swelling and myalgias.  Skin: Negative for pallor and rash.  Neurological: Negative for dizziness, tremors, weakness, numbness and headaches.  Hematological: Negative for adenopathy. Does not bruise/bleed easily.  Psychiatric/Behavioral: Negative for decreased concentration and dysphoric mood. The patient is not nervous/anxious.        Objective:   Physical Exam  Constitutional: She appears well-developed and well-nourished. No distress.  Well appearing   HENT:  Head: Normocephalic and atraumatic.  Mouth/Throat: Oropharynx is clear and moist.  Eyes: Pupils are equal, round, and reactive to light. Conjunctivae and EOM are normal.  Neck: Normal range of motion. Neck supple. No JVD present. Carotid bruit is not present. No thyromegaly present.  Cardiovascular: Normal rate, regular rhythm, normal heart sounds and intact distal pulses. Exam reveals no gallop.    Pulmonary/Chest: Effort normal and breath sounds normal. No respiratory distress. She has no wheezes. She has no rales.  No crackles  Abdominal: Soft. Bowel sounds are normal. She exhibits no distension, no abdominal bruit and no mass. There is no tenderness. There is no rebound and no guarding. No hernia.  Musculoskeletal: She exhibits no edema.  Lymphadenopathy:    She has no cervical adenopathy.  Neurological: She is alert. She has normal reflexes. She displays normal reflexes. No cranial nerve deficit. Coordination normal.  Skin: Skin is warm and dry. No rash noted.  Psychiatric: She has a normal mood and affect.          Assessment & Plan:   Problem List Items Addressed This Visit      Cardiovascular and Mediastinum   Hypertension    bp in fair control at this time  BP Readings from Last 1 Encounters:  02/14/18 122/62   No changes needed Most recent labs reviewed  Disc lifstyle change with low sodium diet and exercise          Endocrine   Diabetes mellitus ([place]) - Primary    Recent change to dec metformin (xr now also) , and addn of glipizide xl 5 Will dec to 2.5 due to several episodes of hypoglycemia  Lab Results  Component Value Date   HGBA1C 7.5 (H) 02/11/2018   Not changed yet- med change recent Per pt home glucose readings are improved ! Good diet /appetite better Disc foot and eye care  Fu 3 mo       Relevant Medications   glipiZIDE (GLUCOTROL XL) 2.5 MG 24 hr tablet   Other Relevant Orders   Hemoglobin A1c     Genitourinary   Renal insufficiency    Improved with lower metformin dose Renal art stenosis is stable per pt         Other   Anemia    Chronic/iron def in setting of h/o non erosive gastropathy and recent fecal blood neg  Add iron (niferex 150 daily)  Disc poss side eff/will update  Re check in 3 mo at f/u        Relevant Medications   iron polysaccharides (NIFEREX) 150 MG capsule   Other Relevant Orders   CBC with  Differential/Platelet   Ferritin   Weight loss    Gained several lb  Appetite is improved  Change in metformin helped  Continue to follow

## 2018-02-16 NOTE — Assessment & Plan Note (Signed)
Recent change to dec metformin (xr now also) , and addn of glipizide xl 5 Will dec to 2.5 due to several episodes of hypoglycemia  Lab Results  Component Value Date   HGBA1C 7.5 (H) 02/11/2018   Not changed yet- med change recent Per pt home glucose readings are improved ! Good diet /appetite better Disc foot and eye care  Fu 3 mo

## 2018-02-16 NOTE — Assessment & Plan Note (Signed)
Chronic/iron def in setting of h/o non erosive gastropathy and recent fecal blood neg  Add iron (niferex 150 daily)  Disc poss side eff/will update  Re check in 3 mo at f/u

## 2018-02-16 NOTE — Assessment & Plan Note (Signed)
Improved with lower metformin dose Renal art stenosis is stable per pt

## 2018-02-16 NOTE — Assessment & Plan Note (Signed)
Gained several lb  Appetite is improved  Change in metformin helped  Continue to follow

## 2018-02-16 NOTE — Assessment & Plan Note (Signed)
bp in fair control at this time  BP Readings from Last 1 Encounters:  02/14/18 122/62   No changes needed Most recent labs reviewed  Disc lifstyle change with low sodium diet and exercise

## 2018-03-16 NOTE — Progress Notes (Signed)
Cardiology Office Note  Date:  03/18/2018   ID:  Tracey [name], DOB 12-Nov-1935, MRN [id number]  PCP:  Abner Greenspan, MD   Chief Complaint  Patient presents with  . OTHER    6 month f/u c/o midsternum and rib cage discomfort/soreness. Meds reviewed verbally with pt.    HPI:  [name] is a very pleasant 82 year-old woman with a history of coronary artery disease, bypass surgery in 1995,  diabetes with hemoglobin A1c 7.0,  stent to her graft to the OM several years ago with chest pain January 2013  repeat stenting to the graft to the OM with a Xience 3.5 x 18 mm stent (patient and husband report that she required delivery of a shock for resuscitation during the procedure),  left renal PCI in 1998, redo PCI March 2010 followed by [name],  who presents for routine followup of her coronary artery disease  She lost her husband in 2016  In follow-up today she reports having some xiphoid discomfort on palpation Started for 5 weeks ago May have started after she lifted heavy case of water into the house Symptoms wax and wane, difficulty laying on her side in the bed Has to wear a tight bra for comfort at nighttime, most tender xiphoid area on deep palpation and on deep inspiration  Otherwise denies significant shortness of breath chest pain Denies any tachycardia or palpitations Granddaughter visits her on a regular basis  grandson who is 36 years old with alcohol addiction in and out of rehab  Lab work reviewed with her in detail Total chol 117, LDL 61 HBA1C 7.6  EKG personally reviewed by myself on todays visit Normal sinus rhythm with rate 58 bpm no significant ST or T-wave changes  Past medical history Previous spells of weakness and fainting Previous 30 day monitor ordered showing no significant arrhythmia, normal sinus rhythm She was having episodes of diarrhea  Prior lab work reviewed with her showing total cholesterol 170s, hemoglobin A1c greater than 7  She  is very active and takes care of a large family.   right knee replacement in July 2014   Details from her bypass surgery showed LIMA to the LAD, vein graft to the PDA and vein graft to the OM Details from the cardiac catheterization January 2013 show proximal LAD occlusion, proximal left circumflex occlusion, proximal RCA occlusion, graft to the OM with 90% disease proximally. DES stent was placed.  Echocardiogram dated January 2012 shows normal systolic function, mild MR and TR   PMH:   has a past medical history of Anxiety, Atrial fibrillation ([place]), Coronary artery disease, Diabetes mellitus, GERD (gastroesophageal reflux disease), Heart murmur, Hyperlipidemia, Hypertension, Peripheral vascular disease ([place]), and UTI (urinary tract infection).  PSH:    Past Surgical History:  Procedure Laterality Date  . APPENDECTOMY    . BREAST BIOPSY Left    20 years ago  . BREAST BIOPSY Right 2008  . BREAST EXCISIONAL BIOPSY     rt 2008 lt "20 years ago"  . CARDIAC CATHETERIZATION    . CATARACT EXTRACTION W/PHACO Left 08/15/2015   Procedure: CATARACT EXTRACTION PHACO AND INTRAOCULAR LENS PLACEMENT (IOC);  Surgeon: Estill Cotta, MD;  Location: [facility] ORS;  Service: Ophthalmology;  Laterality: Left;  [place] 01:28 AP% 26.4 CDE 35.73 fluid pack # [id number]  . COLONOSCOPY WITH PROPOFOL N/A 01/16/2016   Procedure: COLONOSCOPY WITH PROPOFOL;  Surgeon: Manya Silvas, MD;  Location: [facility] ENDOSCOPY;  Service: Endoscopy;  Laterality: N/A;  . CORONARY  ANGIOPLASTY WITH STENT PLACEMENT    . CORONARY ARTERY BYPASS GRAFT     with LIMA  to the LAD, SVG to OM1, SVG to PDA  . ESOPHAGOGASTRODUODENOSCOPY (EGD) WITH PROPOFOL N/A 01/16/2016   Procedure: ESOPHAGOGASTRODUODENOSCOPY (EGD) WITH PROPOFOL;  Surgeon: Manya Silvas, MD;  Location: [facility] ENDOSCOPY;  Service: Endoscopy;  Laterality: N/A;  . RENAL ARTERY STENT     left, By Dr Hulda Humphrey  . REPLACEMENT TOTAL KNEE  03/25/2013   right  . TONSILLECTOMY       Current Outpatient Medications  Medication Sig Dispense Refill  . aspirin 81 MG tablet Take 81 mg by mouth daily.     . clopidogrel (PLAVIX) 75 MG tablet TAKE 1 TABLET BY MOUTH EVERY DAY 90 tablet 3  . cyanocobalamin (,VITAMIN B-12,) 1000 MCG/ML injection INJECT 1ML INTRAMUSCULARLY ONCE MONTHLY AS DIRECTED 1 mL 11  . glipiZIDE (GLUCOTROL XL) 2.5 MG 24 hr tablet Take 1 tablet (2.5 mg total) by mouth daily with breakfast. 90 tablet 3  . glucose blood (ONE TOUCH TEST STRIPS) test strip USE AS DIRECTED TO CHECK BLOOD SUGAR ONCE DAILY E08.00 100 each 2  . iron polysaccharides (NIFEREX) 150 MG capsule Take 1 capsule (150 mg total) by mouth daily. With food 90 capsule 3  . lisinopril (PRINIVIL,ZESTRIL) 20 MG tablet Take 1 tablet (20 mg total) by mouth daily. 90 tablet 3  . metFORMIN (GLUCOPHAGE-XR) 500 MG 24 hr tablet Take 1 tablet (500 mg total) by mouth daily with breakfast. 30 tablet 11  . metoprolol succinate (TOPROL-XL) 25 MG 24 hr tablet Take 1 tablet (25 mg total) by mouth daily. 90 tablet 3  . mirtazapine (REMERON) 30 MG tablet Take 1 tablet (30 mg total) by mouth at bedtime. 90 tablet 3  . Multiple Vitamins-Minerals (MULTIVITAMIN WITH MINERALS) tablet Take 1 tablet by mouth daily.    [name] LANCETS 16S MISC USE AS INSTRUCTED TO TEST BLOOD SUGAR ONCE DAILY E08.00 100 each 2  . pantoprazole (PROTONIX) 40 MG tablet Take 1 tablet (40 mg total) by mouth daily. 90 tablet 2  . rosuvastatin (CRESTOR) 40 MG tablet TAKE 1 TABLET BY MOUTH EVERY DAY 90 tablet 3  . nitroGLYCERIN (NITROSTAT) 0.4 MG SL tablet Place 1 tablet (0.4 mg total) under the tongue every 5 (five) minutes as needed for chest pain. 25 tablet 6   No current facility-administered medications for this visit.      Allergies:   Ciprofloxacin; Codeine; Erythromycin; and Tramadol   Social History:  The patient  reports that she has never smoked. She has never used smokeless tobacco. She reports that she does not drink  alcohol or use drugs.   Family History:   family history includes Cancer in her brother; Heart disease in her father; Hyperlipidemia in her daughter; Other in her brother, brother, and brother.    Review of Systems: Review of Systems  Constitutional: Negative.   Respiratory: Negative.   Cardiovascular: Positive for chest pain.  Gastrointestinal: Negative.   Musculoskeletal: Negative.   Neurological: Negative.   Psychiatric/Behavioral: Negative.   All other systems reviewed and are negative.    PHYSICAL EXAM: VS:  BP 118/60 (BP Location: Left Arm, Patient Position: Sitting, Cuff Size: Normal)   Pulse (!) 58   Ht 5' 2.5" (1.588 m)   Wt 132 lb 8 oz (60.1 kg)   BMI 23.85 kg/m  , BMI Body mass index is 23.85 kg/m. Constitutional:  oriented to person, place, and time. No distress.  HENT:  Head: Normocephalic and atraumatic.  Eyes:  no discharge. No scleral icterus.  Neck: Normal range of motion. Neck supple. No JVD present.  Cardiovascular: Normal rate, regular rhythm, normal heart sounds and intact distal pulses. Exam reveals no gallop and no friction rub. No edema No murmur heard. Pulmonary/Chest: Effort normal and breath sounds normal. No stridor. No respiratory distress.  no wheezes.  no rales.  no tenderness.  Abdominal: Soft.  no distension.  no tenderness.  Musculoskeletal: Normal range of motion.  no  tenderness or deformity.  Neurological:  normal muscle tone. Coordination normal. No atrophy Skin: Skin is warm and dry. No rash noted. not diaphoretic.  Psychiatric:  normal mood and affect. behavior is normal. Thought content normal.    Recent Labs: 11/06/2017: TSH 2.96 02/11/2018: ALT 11; BUN 35; Creatinine, Ser 1.01; Hemoglobin 9.8; Platelets 164.0; Potassium 4.3; Sodium 142    Lipid Panel Lab Results  Component Value Date   CHOL 127 11/06/2017   HDL 34.00 (L) 11/06/2017   LDLCALC 64 11/06/2017   TRIG 144.0 11/06/2017      Wt Readings from Last 3 Encounters:   03/18/18 132 lb 8 oz (60.1 kg)  02/14/18 131 lb (59.4 kg)  01/13/18 128 lb 4 oz (58.2 kg)       ASSESSMENT AND PLAN:  Atypical chest pain Chest wall pain brought on by palpating her xiphoid area Discomfort radiating bilaterally Suspect muscular ligamental disorder possibly started from lifting heavy case of water for 5 weeks ago Recommended she try meloxicam 7.5 milligrams daily if no improvement would try 15 mg Prescription sent in for the 15 mg pill She could ice the area Does not seem to have back pain, less likely rib displacement Less likely cardiac issue She has a call in to primary care for further consultation If no improvement in symptoms potentially could consider steroids, imaging  Essential hypertension - Plan: EKG 12-Lead Blood pressure is well controlled on today's visit. No changes made to the medications. stable  Coronary artery disease due to lipid rich plaque - Plan: EKG 12-Lead Currently with no symptoms of angina. No further workup at this time. Continue current medication regimen.  Other hyperlipidemia - Plan: EKG 12-Lead Cholesterol is at goal on the current lipid regimen. No changes to the medications were made.stable  Renal artery stenosis (HCC) - Plan: EKG 12-Lead  followed by [name] Blood pressure stable  S/P CABG x 3 - Plan: EKG 12-Lead Currently with no symptoms of angina. No further workup at this time. Continue current medication regimen.no changes at this time  Neuropathy  Lower extremity discomfort only at night, legs burning Previous issue, not a major issue on today's visit    Total encounter time more than 25 minutes  Greater than 50% was spent in counseling and coordination of care with the patient      F/U  12 months    Orders Placed This Encounter  Procedures  . EKG 12-Lead     Signed, Esmond Plants, M.D., Ph.D. 03/18/2018  [place], [place]

## 2018-03-17 ENCOUNTER — Telehealth: Payer: Self-pay

## 2018-03-17 NOTE — Telephone Encounter (Signed)
Attempted to contact pt; line busy. Granddaughter, Leafy Ro advised of results

## 2018-03-17 NOTE — Telephone Encounter (Signed)
Tracey [name] pts granddaughter wanted Dr [name] to know that FBS have been running higher since switching to glipizide; FBS between 150 -180 in mornings. Pt is taking 1/2 tab of glipizide every AM per previous instructions by DR [employer]. Please advise.

## 2018-03-17 NOTE — Telephone Encounter (Signed)
It is safer to let blood glucose be a little higher than risk the hypoglycemia she was having (which we discussed at the last visit)   Keep taking the 1/2  Watch diet the best you can  Thanks for letting me know

## 2018-03-18 ENCOUNTER — Ambulatory Visit (INDEPENDENT_AMBULATORY_CARE_PROVIDER_SITE_OTHER): Payer: Medicare HMO | Admitting: Cardiovascular Disease

## 2018-03-18 ENCOUNTER — Encounter: Payer: Self-pay | Admitting: Cardiovascular Disease

## 2018-03-18 VITALS — BP 118/60 | HR 58 | Ht 62.5 in | Wt 132.5 lb

## 2018-03-18 DIAGNOSIS — I701 Atherosclerosis of renal artery: Secondary | ICD-10-CM

## 2018-03-18 DIAGNOSIS — R0602 Shortness of breath: Secondary | ICD-10-CM | POA: Diagnosis not present

## 2018-03-18 DIAGNOSIS — I10 Essential (primary) hypertension: Secondary | ICD-10-CM | POA: Diagnosis not present

## 2018-03-18 DIAGNOSIS — E782 Mixed hyperlipidemia: Secondary | ICD-10-CM | POA: Diagnosis not present

## 2018-03-18 DIAGNOSIS — E0800 Diabetes mellitus due to underlying condition with hyperosmolarity without nonketotic hyperglycemic-hyperosmolar coma (NKHHC): Secondary | ICD-10-CM | POA: Diagnosis not present

## 2018-03-18 DIAGNOSIS — I25118 Atherosclerotic heart disease of native coronary artery with other forms of angina pectoris: Secondary | ICD-10-CM

## 2018-03-18 DIAGNOSIS — Z951 Presence of aortocoronary bypass graft: Secondary | ICD-10-CM

## 2018-03-18 DIAGNOSIS — R079 Chest pain, unspecified: Secondary | ICD-10-CM

## 2018-03-18 MED ORDER — MELOXICAM 15 MG PO TBDP: 15.0000 | ORAL_TABLET | Freq: Every day | ORAL | 0 refills | Status: DC | PRN

## 2018-03-18 NOTE — Patient Instructions (Signed)
Medication Instructions:   Try meloxicam once a day with pepcid/zantac for pain  Labwork:  No new labs needed  Testing/Procedures:  No further testing at this time   Follow-Up: It was a pleasure seeing you in the office today. Please call us if you have new issues that need to be addressed before your next appt.  [phone]  Your physician wants you to follow-up in: 12 months.  You will receive a reminder letter in the mail two months in advance. If you don't receive a letter, please call our office to schedule the follow-up appointment.  If you need a refill on your cardiac medications before your next appointment, please call your pharmacy.  For educational health videos Log in to : [email or web address] Or : [email or web address], password : [email or web address]

## 2018-03-19 ENCOUNTER — Telehealth: Payer: Self-pay | Admitting: Cardiovascular Disease

## 2018-03-19 NOTE — Telephone Encounter (Signed)
[facility] calling States that the prescription for Meloxicam 15 MG has patient taking 15 tablets by mouth daily States this is too much and needs clarification on correct amount  Please call with any questions at [phone]

## 2018-03-21 MED ORDER — MELOXICAM 15 MG PO TABS: 15.0000 mg | ORAL_TABLET | Freq: Every day | ORAL | 0 refills | Status: DC | PRN

## 2018-03-21 NOTE — Telephone Encounter (Signed)
meloxicam 15 mg once daily thx

## 2018-03-21 NOTE — Telephone Encounter (Signed)
New prescription sent in.

## 2018-04-13 DIAGNOSIS — M25562 Pain in left knee: Secondary | ICD-10-CM | POA: Diagnosis not present

## 2018-04-13 DIAGNOSIS — M1712 Unilateral primary osteoarthritis, left knee: Secondary | ICD-10-CM | POA: Diagnosis not present

## 2018-04-14 ENCOUNTER — Telehealth: Payer: Self-pay

## 2018-04-14 DIAGNOSIS — R69 Illness, unspecified: Secondary | ICD-10-CM | POA: Diagnosis not present

## 2018-04-14 MED ORDER — GLUCOSE BLOOD VI STRP: ORAL_STRIP | 2 refills | Status: DC

## 2018-04-14 MED ORDER — ONETOUCH DELICA LANCETS 33G MISC: 2 refills | Status: DC

## 2018-04-14 NOTE — Telephone Encounter (Signed)
Blood sugar will be much higher than usual while on the prednisone so be extra careful of what you eat and keep [place] posted   Should improve when she is done Thanks for the heads up

## 2018-04-14 NOTE — Telephone Encounter (Signed)
Test strips and lancets refilled per protocol to [facility]. Also FYI to Dr [name]; pt started on 5 day prednisone taper on 04/13/18; FBS running in 300's. [name] granddaughter notified pt that prednisone could elevate BS. Please let pt know if anything further to do at this point.

## 2018-04-14 NOTE — Telephone Encounter (Signed)
Leafy Ro (granddaughter) notified of Dr. Marliss Coots comments and will keep [place] posted

## 2018-04-18 DIAGNOSIS — M25562 Pain in left knee: Secondary | ICD-10-CM | POA: Diagnosis not present

## 2018-04-18 DIAGNOSIS — M1712 Unilateral primary osteoarthritis, left knee: Secondary | ICD-10-CM | POA: Diagnosis not present

## 2018-04-25 DIAGNOSIS — M1712 Unilateral primary osteoarthritis, left knee: Secondary | ICD-10-CM | POA: Diagnosis not present

## 2018-04-27 DIAGNOSIS — M1712 Unilateral primary osteoarthritis, left knee: Secondary | ICD-10-CM | POA: Insufficient documentation

## 2018-05-16 ENCOUNTER — Telehealth: Payer: Self-pay

## 2018-05-16 NOTE — Telephone Encounter (Signed)
Tracey [name] will come before lunch to discuss her concerns directly with you

## 2018-05-16 NOTE — Telephone Encounter (Signed)
We decided to have her f/u with a 14 day journal of eating and blood glucose readings (3-4 times per day) - to review before making a plan  Also being mindful of activity levels

## 2018-05-16 NOTE — Telephone Encounter (Signed)
If we could raise the metformin dose that would be optimal (since it does not cause hypoglycemia like most other DM medications)  We cannot raise it due to increase in Cr with higher doses / also side eff of diarrhea  We decreased the glipizide due to low blood sugars   I wonder if we could push it further with a change in diet   Does she skip meals ?  Can she keep a diet journal? -- I want to know if we can change what she eats and when to improve the afternoon lows  Low is more dangerous than high  Please give me an example of what she eats during the day and at what times

## 2018-05-16 NOTE — Telephone Encounter (Signed)
[name] pts granddaughter said pt said AM blood sugars running 180's - 230. In evening before supper blood sugar running 140-200's.  Pt wants to know if glipizide should be increased to 5 mg. [name] RN and pts granddaughter states pt still has mid late afternoon lows of BS which drops in the 60's. This happens every 2 weeks. Leafy Ro is concerned about increasing pts med. Pt is also on Metformin XR 500 mg and [name] wonders if that could be part of the problem with BS dropping. [name] RN request to know recommendations by Dr [name].

## 2018-06-03 ENCOUNTER — Encounter: Payer: Self-pay | Admitting: Family Medicine

## 2018-06-03 ENCOUNTER — Ambulatory Visit: Payer: Medicare HMO | Admitting: Family Medicine

## 2018-06-03 VITALS — BP 116/64 | HR 66 | Temp 98.1°F | Ht 62.5 in | Wt 132.8 lb

## 2018-06-03 DIAGNOSIS — E0800 Diabetes mellitus due to underlying condition with hyperosmolarity without nonketotic hyperglycemic-hyperosmolar coma (NKHHC): Secondary | ICD-10-CM

## 2018-06-03 DIAGNOSIS — G43109 Migraine with aura, not intractable, without status migrainosus: Secondary | ICD-10-CM | POA: Diagnosis not present

## 2018-06-03 DIAGNOSIS — R69 Illness, unspecified: Secondary | ICD-10-CM | POA: Diagnosis not present

## 2018-06-03 DIAGNOSIS — I10 Essential (primary) hypertension: Secondary | ICD-10-CM | POA: Diagnosis not present

## 2018-06-03 DIAGNOSIS — E538 Deficiency of other specified B group vitamins: Secondary | ICD-10-CM | POA: Diagnosis not present

## 2018-06-03 DIAGNOSIS — D3131 Benign neoplasm of right choroid: Secondary | ICD-10-CM | POA: Diagnosis not present

## 2018-06-03 DIAGNOSIS — Z23 Encounter for immunization: Secondary | ICD-10-CM

## 2018-06-03 DIAGNOSIS — H43812 Vitreous degeneration, left eye: Secondary | ICD-10-CM | POA: Diagnosis not present

## 2018-06-03 LAB — POCT GLYCOSYLATED HEMOGLOBIN (HGB A1C): Hemoglobin A1C: 7.9 % — AB (ref 4.0–5.6)

## 2018-06-03 LAB — HM DIABETES EYE EXAM

## 2018-06-03 MED ORDER — GLUCOSE BLOOD VI STRP: ORAL_STRIP | 2 refills | Status: DC

## 2018-06-03 MED ORDER — ONETOUCH DELICA LANCETS 33G MISC: 2 refills | Status: DC

## 2018-06-03 MED ORDER — CYANOCOBALAMIN 1000 MCG/ML IJ SOLN
1000.0000 ug | Freq: Once | INTRAMUSCULAR | Status: AC
  Administered 2018-06-03: 1000 ug via INTRAMUSCULAR

## 2018-06-03 NOTE — Progress Notes (Signed)
Subjective:    Patient ID: Tracey [name], female    DOB: August 15, 1936, 82 y.o.   MRN: [id number]  HPI Here for f/u of diabetes  Had an episode of impaired vision one night  Saw the eye doctor - good exam  No headache -  ? opthy migraine  Has f/u upcoming - ? Macular nevus as well    Wt Readings from Last 3 Encounters:  06/03/18 132 lb 12 oz (60.2 kg)  03/18/18 132 lb 8 oz (60.1 kg)  02/14/18 131 lb (59.4 kg)  stable  23.89 kg/m   bp is stable today  No cp or palpitations or headaches or edema  No side effects to medicines  BP Readings from Last 3 Encounters:  06/03/18 116/64  03/18/18 118/60  02/14/18 122/62      Last visit noted some episodes of hypoglycemia  On metformin xr 500 mg  Glipizide xl 5 mg -then dec to 2.5 mg Lab Results  Component Value Date   HGBA1C 7.5 (H) 02/11/2018   Her blood glucose levels went up with 2.5 mg glipizide xl  In am - usually 140s (give or take 10 on avg)  More labile in evenings  Nothing over 200 (perhaps avg 160s)  She is overall pleased   Today Lab Results  Component Value Date   HGBA1C 7.9 (A) 06/03/2018   She was on prednisone and then had cortisone shot in knee    Eating well  Not skipping meals  Has cracker/ cheese or pb for snack or walnuts   Needs a knee replacement  Needs A1C under 8   Needs B12 shot today  Lab Results  Component Value Date   VITAMINB12 571 11/06/2017   Patient Active Problem List   Diagnosis Date Noted  . Weight loss 01/13/2018  . Atrial fibrillation ([place]) 11/11/2017  . Routine general medical examination at a health care facility 11/11/2017  . Renal insufficiency 11/11/2017  . Osteopenia 04/30/2017  . Anemia 02/12/2017  . Vitamin D deficiency 02/03/2017  . Well woman exam 11/05/2016  . Vitamin B12 deficiency 11/05/2016  . Depression 04/19/2016  . Fatigue 02/06/2016  . Frequent loose stools 01/05/2016  . Chest pain 08/26/2013  . Hypertension 01/22/2012  . Coronary heart  disease 01/22/2012  . S/P CABG x 3 01/22/2012  . Hyperlipidemia 01/22/2012  . Diabetes mellitus ([place]) 01/22/2012  . Renal artery stenosis ([place]) 01/22/2012   Past Medical History:  Diagnosis Date  . Anxiety   . Atrial fibrillation ([place])    history  . Coronary artery disease   . Diabetes mellitus    non insulin dependent  . GERD (gastroesophageal reflux disease)   . Heart murmur   . Hyperlipidemia   . Hypertension   . Peripheral vascular disease ([place])   . UTI (urinary tract infection)    Past Surgical History:  Procedure Laterality Date  . APPENDECTOMY    . BREAST BIOPSY Left    20 years ago  . BREAST BIOPSY Right 2008  . BREAST EXCISIONAL BIOPSY     rt 2008 lt "20 years ago"  . CARDIAC CATHETERIZATION    . CATARACT EXTRACTION W/PHACO Left 08/15/2015   Procedure: CATARACT EXTRACTION PHACO AND INTRAOCULAR LENS PLACEMENT (IOC);  Surgeon: Estill Cotta, MD;  Location: [facility] ORS;  Service: Ophthalmology;  Laterality: Left;  [place] 01:28 AP% 26.4 CDE 35.73 fluid pack # [id number]  . COLONOSCOPY WITH PROPOFOL N/A 01/16/2016   Procedure: COLONOSCOPY WITH PROPOFOL;  Surgeon: Manya Silvas, MD;  Location: [facility] ENDOSCOPY;  Service: Endoscopy;  Laterality: N/A;  . CORONARY ANGIOPLASTY WITH STENT PLACEMENT    . CORONARY ARTERY BYPASS GRAFT     with LIMA  to the LAD, SVG to OM1, SVG to PDA  . ESOPHAGOGASTRODUODENOSCOPY (EGD) WITH PROPOFOL N/A 01/16/2016   Procedure: ESOPHAGOGASTRODUODENOSCOPY (EGD) WITH PROPOFOL;  Surgeon: Manya Silvas, MD;  Location: [facility] ENDOSCOPY;  Service: Endoscopy;  Laterality: N/A;  . RENAL ARTERY STENT     left, By Dr Hulda Humphrey  . REPLACEMENT TOTAL KNEE  03/25/2013   right  . TONSILLECTOMY     Social History   Tobacco Use  . Smoking status: Never Smoker  . Smokeless tobacco: Never Used  Substance Use Topics  . Alcohol use: No  . Drug use: No   Family History  Problem Relation Age of Onset  . Heart disease Father   . Other Brother        open heart  surgery  . Cancer Brother   . Other Brother        open heart surgery  . Other Brother        open heart surgery  . Hyperlipidemia Daughter   . Breast cancer Neg Hx   . Kidney cancer Neg Hx   . Bladder Cancer Neg Hx    Allergies  Allergen Reactions  . Ciprofloxacin Other (See Comments)  . Codeine     Nausea   . Erythromycin Nausea And Vomiting  . Tramadol    Current Outpatient Medications on File Prior to Visit  Medication Sig Dispense Refill  . aspirin 81 MG tablet Take 81 mg by mouth daily.     . clopidogrel (PLAVIX) 75 MG tablet TAKE 1 TABLET BY MOUTH EVERY DAY 90 tablet 3  . cyanocobalamin (,VITAMIN B-12,) 1000 MCG/ML injection INJECT 1ML INTRAMUSCULARLY ONCE MONTHLY AS DIRECTED 1 mL 11  . glipiZIDE (GLUCOTROL XL) 5 MG 24 hr tablet Take 5 mg by mouth daily with breakfast.    . iron polysaccharides (NIFEREX) 150 MG capsule Take 1 capsule (150 mg total) by mouth daily. With food 90 capsule 3  . lisinopril (PRINIVIL,ZESTRIL) 20 MG tablet Take 1 tablet (20 mg total) by mouth daily. 90 tablet 3  . meloxicam (MOBIC) 15 MG tablet Take 1 tablet (15 mg total) by mouth daily as needed for pain. 30 tablet 0  . metFORMIN (GLUCOPHAGE-XR) 500 MG 24 hr tablet Take 1 tablet (500 mg total) by mouth daily with breakfast. 30 tablet 11  . metoprolol succinate (TOPROL-XL) 25 MG 24 hr tablet Take 1 tablet (25 mg total) by mouth daily. 90 tablet 3  . mirtazapine (REMERON) 30 MG tablet Take 1 tablet (30 mg total) by mouth at bedtime. 90 tablet 3  . Multiple Vitamins-Minerals (MULTIVITAMIN WITH MINERALS) tablet Take 1 tablet by mouth daily.    . pantoprazole (PROTONIX) 40 MG tablet Take 1 tablet (40 mg total) by mouth daily. 90 tablet 2  . rosuvastatin (CRESTOR) 40 MG tablet TAKE 1 TABLET BY MOUTH EVERY DAY 90 tablet 3  . nitroGLYCERIN (NITROSTAT) 0.4 MG SL tablet Place 1 tablet (0.4 mg total) under the tongue every 5 (five) minutes as needed for chest pain. 25 tablet 6   No current  facility-administered medications on file prior to visit.     Review of Systems  Constitutional: Negative for activity change, appetite change, fatigue, fever and unexpected weight change.  HENT: Negative for congestion, ear pain, rhinorrhea, sinus pressure and sore throat.   Eyes: Negative for  pain, redness and visual disturbance.  Respiratory: Negative for cough, shortness of breath and wheezing.   Cardiovascular: Negative for chest pain and palpitations.  Gastrointestinal: Negative for abdominal pain, blood in stool, constipation and diarrhea.  Endocrine: Negative for polydipsia and polyuria.  Genitourinary: Negative for dysuria, frequency and urgency.  Musculoskeletal: Positive for arthralgias. Negative for back pain and myalgias.       Knee pain  Skin: Negative for pallor and rash.  Allergic/Immunologic: Negative for environmental allergies.  Neurological: Negative for dizziness, syncope and headaches.  Hematological: Negative for adenopathy. Does not bruise/bleed easily.  Psychiatric/Behavioral: Negative for decreased concentration and dysphoric mood. The patient is not nervous/anxious.        Objective:   Physical Exam  Constitutional: She appears well-developed and well-nourished. No distress.  Well appearing  HENT:  Head: Normocephalic and atraumatic.  Mouth/Throat: Oropharynx is clear and moist.  Eyes: Pupils are equal, round, and reactive to light. Conjunctivae and EOM are normal.  Neck: Normal range of motion. Neck supple. No JVD present. Carotid bruit is not present. No thyromegaly present.  Cardiovascular: Normal rate, normal heart sounds and intact distal pulses. Exam reveals no gallop.  Pulmonary/Chest: Effort normal and breath sounds normal. No respiratory distress. She has no wheezes. She has no rales.  No crackles  Abdominal: Soft. Bowel sounds are normal. She exhibits no distension, no abdominal bruit and no mass. There is no tenderness.  Musculoskeletal: She  exhibits no edema.  Lymphadenopathy:    She has no cervical adenopathy.  Neurological: She is alert. She has normal reflexes.  Skin: Skin is warm and dry. No rash noted.  Psychiatric: She has a normal mood and affect.          Assessment & Plan:   Problem List Items Addressed This Visit      Cardiovascular and Mediastinum   Hypertension    bp in fair control at this time  BP Readings from Last 1 Encounters:  06/03/18 116/64   No changes needed Most recent labs reviewed  Disc lifstyle change with low sodium diet and exercise          Endocrine   Diabetes mellitus ([place]) - Primary    Lab Results  Component Value Date   HGBA1C 7.9 (A) 06/03/2018   This is up  Pt rev blood glucose levels from 2 weeks- lower than expected for this A1C She did however take a course of prednisone last mo and also shot in knee  I expect her glucose was high at that time and this a1c reflects it  Good diet and exercise habits Cannot raise metformin dose due to renal insuff Will try again adv glipizide xl to 5 mg watching closely for hypoglycemia  Refilled lancets/strips- for bid and prn monitoring  Enc her to update [place] if problems         Relevant Medications   glipiZIDE (GLUCOTROL XL) 5 MG 24 hr tablet   Other Relevant Orders   POCT glycosylated hemoglobin (Hb A1C) (Completed)     Other   Vitamin B12 deficiency    Due for monthly B12 shot today      Relevant Medications   cyanocobalamin ((VITAMIN B-12)) injection 1,000 mcg (Completed)    Other Visit Diagnoses    Need for influenza vaccination       Relevant Orders   Flu Vaccine QUAD 6+ mos PF IM (Fluarix Quad PF) (Completed)

## 2018-06-03 NOTE — Assessment & Plan Note (Addendum)
Lab Results  Component Value Date   HGBA1C 7.9 (A) 06/03/2018   This is up  Pt rev blood glucose levels from 2 weeks- lower than expected for this A1C She did however take a course of prednisone last mo and also shot in knee  I expect her glucose was high at that time and this a1c reflects it  Good diet and exercise habits Cannot raise metformin dose due to renal insuff Will try again adv glipizide xl to 5 mg watching closely for hypoglycemia  Refilled lancets/strips- for bid and prn monitoring  Enc her to update [place] if problems

## 2018-06-03 NOTE — Patient Instructions (Addendum)
Go back to 5 mg of glipizide xl  If you begin to have low blood sugar readings please call and let me know   Don't skip meals   Take care of yourself   I think the increase in your A1C is from the steroids you had in the past 3 months   B12 shot today  Flu shot today

## 2018-06-03 NOTE — Assessment & Plan Note (Signed)
bp in fair control at this time  BP Readings from Last 1 Encounters:  06/03/18 116/64   No changes needed Most recent labs reviewed  Disc lifstyle change with low sodium diet and exercise

## 2018-06-03 NOTE — Assessment & Plan Note (Signed)
Due for monthly B12 shot today

## 2018-06-04 ENCOUNTER — Ambulatory Visit: Payer: Medicare HMO | Admitting: Family Medicine

## 2018-06-12 ENCOUNTER — Encounter: Payer: Self-pay | Admitting: Family Medicine

## 2018-06-12 ENCOUNTER — Telehealth: Payer: Self-pay

## 2018-06-12 NOTE — Telephone Encounter (Signed)
Thanks for the heads up -please alert me if sugar lows persist on the 2.5 mg dose   Let me know how you are doing next week

## 2018-06-12 NOTE — Telephone Encounter (Signed)
Tracey [name] Granddaughter notified

## 2018-06-12 NOTE — Telephone Encounter (Signed)
Pt evening BS dropping <70 for past 2 days. She almost passed out last night due to hypoglycemia. [name] RN/Granddaughter instructed pt to hold glipizide only today and restart glipizide 2.5 mg(1/2 dose) on 06/13/18. Pt will continue taking metformin as instructed.   [name] spoke with Dr [name] and in agreement with plan. FYI to Dr [name],

## 2018-07-10 ENCOUNTER — Inpatient Hospital Stay: Admission: RE | Admit: 2018-07-10 | Payer: Medicare HMO | Source: Ambulatory Visit

## 2018-07-11 ENCOUNTER — Other Ambulatory Visit: Payer: Self-pay

## 2018-07-11 DIAGNOSIS — R69 Illness, unspecified: Secondary | ICD-10-CM | POA: Diagnosis not present

## 2018-07-11 MED ORDER — CYANOCOBALAMIN 1000 MCG/ML IJ SOLN: INTRAMUSCULAR | 11 refills | Status: DC

## 2018-07-11 NOTE — Telephone Encounter (Signed)
[name] RN pts granddaughter request refill B12 to [facility]. Pt last seen 06/03/18. Refill done per protocol and [name] voiced understanding.

## 2018-07-27 DIAGNOSIS — R69 Illness, unspecified: Secondary | ICD-10-CM | POA: Diagnosis not present

## 2018-08-04 ENCOUNTER — Other Ambulatory Visit: Payer: Self-pay | Admitting: Family Medicine

## 2018-08-04 MED ORDER — PANTOPRAZOLE SODIUM 40 MG PO TBEC: 40.0000 mg | DELAYED_RELEASE_TABLET | Freq: Every day | ORAL | 3 refills | Status: DC

## 2018-08-18 DIAGNOSIS — R69 Illness, unspecified: Secondary | ICD-10-CM | POA: Diagnosis not present

## 2018-09-22 DIAGNOSIS — M25561 Pain in right knee: Secondary | ICD-10-CM | POA: Diagnosis not present

## 2018-09-22 DIAGNOSIS — M7061 Trochanteric bursitis, right hip: Secondary | ICD-10-CM | POA: Diagnosis not present

## 2018-10-02 ENCOUNTER — Other Ambulatory Visit: Payer: Self-pay | Admitting: Cardiovascular Disease

## 2018-10-03 NOTE — Discharge Instructions (Signed)
°  Instructions after Total Knee Replacement ° ° Jatorian Renault P. Natavia Sublette, Jr., M.D.    ° Dept. of Orthopaedics & Sports Medicine ° [facility] ° [address] ° [place], [place]  [address] ° Phone: [phone]   Fax: [phone] ° °  °DIET: °• Drink plenty of non-alcoholic fluids. °• Resume your normal diet. Include foods high in fiber. ° °ACTIVITY:  °• You may use crutches or a walker with weight-bearing as tolerated, unless instructed otherwise. °• You may be weaned off of the walker or crutches by your Physical Therapist.  °• Do NOT place pillows under the knee. Anything placed under the knee could limit your ability to straighten the knee.   °• Continue doing gentle exercises. Exercising will reduce the pain and swelling, increase motion, and prevent muscle weakness.   °• Please continue to use the TED compression stockings for 6 weeks. You may remove the stockings at night, but should reapply them in the morning. °• Do not drive or operate any equipment until instructed. ° °WOUND CARE:  °• Continue to use the PolarCare or ice packs periodically to reduce pain and swelling. °• You may bathe or shower after the staples are removed at the first office visit following surgery. ° °MEDICATIONS: °• You may resume your regular medications. °• Please take the pain medication as prescribed on the medication. °• Do not take pain medication on an empty stomach. °• You have been given a prescription for a blood thinner (Lovenox or Coumadin). Please take the medication as instructed. (NOTE: After completing a 2 week course of Lovenox, take one Enteric-coated aspirin once a day. This along with elevation will help reduce the possibility of phlebitis in your operated leg.) °• Do not drive or drink alcoholic beverages when taking pain medications. ° °CALL THE OFFICE FOR: °• Temperature above 101 degrees °• Excessive bleeding or drainage on the dressing. °• Excessive swelling, coldness, or paleness of the toes. °• Persistent  nausea and vomiting. ° °FOLLOW-UP:  °• You should have an appointment to return to the office in 10-14 days after surgery. °• Arrangements have been made for continuation of Physical Therapy (either home therapy or outpatient therapy). °  °

## 2018-10-06 ENCOUNTER — Other Ambulatory Visit: Payer: Self-pay

## 2018-10-06 ENCOUNTER — Encounter
Admission: RE | Admit: 2018-10-06 | Discharge: 2018-10-06 | Disposition: A | Discharge status: Home / Self Care | Payer: Medicare HMO | Source: Ambulatory Visit | Attending: Orthopedic Surgery | Admitting: Orthopedic Surgery

## 2018-10-06 ENCOUNTER — Inpatient Hospital Stay: Admission: RE | Admit: 2018-10-06 | Payer: Medicare HMO | Source: Ambulatory Visit

## 2018-10-06 DIAGNOSIS — I10 Essential (primary) hypertension: Secondary | ICD-10-CM | POA: Diagnosis not present

## 2018-10-06 DIAGNOSIS — R69 Illness, unspecified: Secondary | ICD-10-CM | POA: Diagnosis not present

## 2018-10-06 DIAGNOSIS — Z01818 Encounter for other preprocedural examination: Secondary | ICD-10-CM | POA: Insufficient documentation

## 2018-10-06 HISTORY — DX: Dyspnea, unspecified: R06.00

## 2018-10-06 HISTORY — DX: Unspecified osteoarthritis, unspecified site: M19.90

## 2018-10-06 LAB — URINALYSIS, ROUTINE W REFLEX MICROSCOPIC
Bacteria, UA: NONE SEEN
Bilirubin Urine: NEGATIVE
Glucose, UA: NEGATIVE mg/dL
KETONES UR: 5 mg/dL — AB
Nitrite: NEGATIVE
Protein, ur: 100 mg/dL — AB
Specific Gravity, Urine: 1.023 (ref 1.005–1.030)
pH: 5 (ref 5.0–8.0)

## 2018-10-06 LAB — CBC
HEMATOCRIT: 36.2 % (ref 36.0–46.0)
Hemoglobin: 11.2 g/dL — ABNORMAL LOW (ref 12.0–15.0)
MCH: 25.9 pg — ABNORMAL LOW (ref 26.0–34.0)
MCHC: 30.9 g/dL (ref 30.0–36.0)
MCV: 83.8 fL (ref 80.0–100.0)
Platelets: 216 10*3/uL (ref 150–400)
RBC: 4.32 MIL/uL (ref 3.87–5.11)
RDW: 14.1 % (ref 11.5–15.5)
WBC: 6.8 10*3/uL (ref 4.0–10.5)
nRBC: 0 % (ref 0.0–0.2)

## 2018-10-06 LAB — COMPREHENSIVE METABOLIC PANEL
ALT: 15 U/L (ref 0–44)
AST: 26 U/L (ref 15–41)
Albumin: 4.5 g/dL (ref 3.5–5.0)
Alkaline Phosphatase: 50 U/L (ref 38–126)
Anion gap: 6 (ref 5–15)
BUN: 36 mg/dL — ABNORMAL HIGH (ref 8–23)
CO2: 27 mmol/L (ref 22–32)
CREATININE: 1.4 mg/dL — AB (ref 0.44–1.00)
Calcium: 10.2 mg/dL (ref 8.9–10.3)
Chloride: 104 mmol/L (ref 98–111)
GFR calc Af Amer: 41 mL/min — ABNORMAL LOW (ref >60)
GFR calc non Af Amer: 35 mL/min — ABNORMAL LOW (ref >60)
Glucose, Bld: 285 mg/dL — ABNORMAL HIGH (ref 70–99)
Potassium: 3.9 mmol/L (ref 3.5–5.1)
Sodium: 137 mmol/L (ref 135–145)
TOTAL PROTEIN: 7.4 g/dL (ref 6.5–8.1)
Total Bilirubin: 0.5 mg/dL (ref 0.3–1.2)

## 2018-10-06 LAB — SURGICAL PCR SCREEN
MRSA, PCR: NEGATIVE
Staphylococcus aureus: NEGATIVE

## 2018-10-06 LAB — TYPE AND SCREEN
ABO/RH(D): A POS
Antibody Screen: NEGATIVE

## 2018-10-06 LAB — HEMOGLOBIN A1C
Hgb A1c MFr Bld: 8.1 % — ABNORMAL HIGH (ref 4.8–5.6)
Mean Plasma Glucose: 185.77 mg/dL

## 2018-10-06 LAB — APTT: aPTT: 29 seconds (ref 24–36)

## 2018-10-06 LAB — SEDIMENTATION RATE: Sed Rate: 16 mm/hr (ref 0–30)

## 2018-10-06 LAB — PROTIME-INR
INR: 1.01
Prothrombin Time: 13.2 seconds (ref 11.4–15.2)

## 2018-10-06 LAB — C-REACTIVE PROTEIN: CRP: <0.8 mg/dL (ref <1.0)

## 2018-10-06 NOTE — Patient Instructions (Signed)
Your procedure is scheduled on: 10/20/18 Mon Report to Same Day Surgery 2nd floor medical mall [facility] Entrance-take elevator on left to 2nd floor.  Check in with surgery information desk.) To find out your arrival time please call [phone] between 1PM - 3PM on 10/17/17 Fri  Remember: Instructions that are not followed completely may result in serious medical risk, up to and including death, or upon the discretion of your surgeon and anesthesiologist your surgery may need to be rescheduled.    _x___ 1. Do not eat food after midnight the night before your procedure. You may drink clear liquids up to 2 hours before you are scheduled to arrive at the hospital for your procedure.  Do not drink clear liquids within 2 hours of your scheduled arrival to the hospital.  Clear liquids include  --Water or Apple juice without pulp  --Clear carbohydrate beverage such as ClearFast or Gatorade  --Black Coffee or Clear Tea (No milk, no creamers, do not add anything to                  the coffee or Tea Type 1 and type 2 diabetics should only drink water.   ____Ensure clear carbohydrate drink on the way to the hospital for bariatric patients  ____Ensure clear carbohydrate drink 3 hours before surgery for Dr Dwyane Luo patients if physician instructed.   No gum chewing or hard candies.     __x__ 2. No Alcohol for 24 hours before or after surgery.   __x__3. No Smoking or e-cigarettes for 24 prior to surgery.  Do not use any chewable tobacco products for at least 6 hour prior to surgery   ____  4. Bring all medications with you on the day of surgery if instructed.    __x__ 5. Notify your doctor if there is any change in your medical condition     (cold, fever, infections).    x___6. On the morning of surgery brush your teeth with toothpaste and water.  You may rinse your mouth with mouth wash if you wish.  Do not swallow any toothpaste or mouthwash.   Do not wear jewelry, make-up, hairpins,  clips or nail polish.  Do not wear lotions, powders, or perfumes. You may wear deodorant.  Do not shave 48 hours prior to surgery. Men may shave face and neck.  Do not bring valuables to the hospital.    [facility] is not responsible for any belongings or valuables.               Contacts, dentures or bridgework may not be worn into surgery.  Leave your suitcase in the car. After surgery it may be brought to your room.  For patients admitted to the hospital, discharge time is determined by your                       treatment team.  _  Patients discharged the day of surgery will not be allowed to drive home.  You will need someone to drive you home and stay with you the night of your procedure.    Please read over the following fact sheets that you were given:   [facility] Preparing for Surgery and or MRSA Information   _x___ Take anti-hypertensive listed below, cardiac, seizure, asthma,     anti-reflux and psychiatric medicines. These include:  1. metoprolol succinate (TOPROL-XL) 25 MG 24 hr tablet  2.pantoprazole (PROTONIX) 40 MG tablet  3.  4.  5.  6.  ____Fleets enema or Magnesium Citrate as directed.   _x___ Use CHG Soap or sage wipes as directed on instruction sheet   ____ Use inhalers on the day of surgery and bring to hospital day of surgery  _x___ Stop Metformin and Janumet 2 days prior to surgery.    ____ Take 1/2 of usual insulin dose the night before surgery and none on the morning     surgery.   _x___ Follow recommendations from Cardiologist, Pulmonologist or PCP regarding          stopping Aspirin, Coumadin, Plavix ,Eliquis, Effient, or Pradaxa, and Pletal.  X____Stop Anti-inflammatories such as Advil, Aleve, Ibuprofen, Motrin, Naproxen, Naprosyn, Goodies powders or aspirin products. OK to take Tylenol and                          Celebrex.   _x___ Stop supplements until after surgery.  But may continue Vitamin D, Vitamin B,       and multivitamin.   ____  Bring C-Pap to the hospital.

## 2018-10-08 LAB — URINE CULTURE: Special Requests: NORMAL

## 2018-10-08 NOTE — Pre-Procedure Instructions (Signed)
UC FAXED TO DR HOOTEN 

## 2018-10-14 DIAGNOSIS — M25562 Pain in left knee: Secondary | ICD-10-CM | POA: Diagnosis not present

## 2018-10-15 ENCOUNTER — Ambulatory Visit (INDEPENDENT_AMBULATORY_CARE_PROVIDER_SITE_OTHER): Payer: Medicare HMO | Admitting: Family Medicine

## 2018-10-15 ENCOUNTER — Encounter: Payer: Self-pay | Admitting: Family Medicine

## 2018-10-15 VITALS — BP 106/62 | HR 73 | Temp 98.1°F | Ht 63.0 in

## 2018-10-15 DIAGNOSIS — N289 Disorder of kidney and ureter, unspecified: Secondary | ICD-10-CM

## 2018-10-15 DIAGNOSIS — R8290 Unspecified abnormal findings in urine: Secondary | ICD-10-CM | POA: Insufficient documentation

## 2018-10-15 DIAGNOSIS — E0800 Diabetes mellitus due to underlying condition with hyperosmolarity without nonketotic hyperglycemic-hyperosmolar coma (NKHHC): Secondary | ICD-10-CM

## 2018-10-15 DIAGNOSIS — I701 Atherosclerosis of renal artery: Secondary | ICD-10-CM

## 2018-10-15 DIAGNOSIS — D649 Anemia, unspecified: Secondary | ICD-10-CM | POA: Diagnosis not present

## 2018-10-15 DIAGNOSIS — I10 Essential (primary) hypertension: Secondary | ICD-10-CM

## 2018-10-15 DIAGNOSIS — I4891 Unspecified atrial fibrillation: Secondary | ICD-10-CM | POA: Diagnosis not present

## 2018-10-15 MED ORDER — GLIPIZIDE ER 5 MG PO TB24: 5.0000 mg | ORAL_TABLET | Freq: Every day | ORAL | 11 refills | Status: DC

## 2018-10-15 NOTE — Patient Instructions (Addendum)
Stick to a diabetic diet  Eat out less! Try to get most of your carbohydrates from produce (with the exception of white potatoes)  Eat less bread/pasta/rice/snack foods/cereals/sweets and other items from the middle of the grocery store (processed carbs)   Check glucose twice daily and keep a log  Drink your water   Do upper body exercise   Increase the glipizide xl to 5 mg daily  If low glucose let me know   Follow up in about a month with your glucose log   We will culture your urine and treat you if there is an infection   Take care of yourself

## 2018-10-15 NOTE — Progress Notes (Signed)
Subjective:    Patient ID: Tracey [name], female    DOB: May 29, 1936, 83 y.o.   MRN: [id number]  HPI  Here to review some pre op lab work  Had to postpone surgery   Wt Readings from Last 3 Encounters:  10/06/18 134 lb (60.8 kg)  06/03/18 132 lb 12 oz (60.2 kg)  03/18/18 132 lb 8 oz (60.1 kg)   23.74 kg/m   Planning TKA on 2/3  Lab Results  Component Value Date   CREATININE 1.40 (H) 10/06/2018   BUN 36 (H) 10/06/2018   NA 137 10/06/2018   K 3.9 10/06/2018   CL 104 10/06/2018   CO2 27 10/06/2018   H/o renal art stenosis  Checked in march  Sees Dr Lucky Cowboy   Drinks a lot of water  She did take aleve for 1 week (a mo before)  Taking tylenol since   Lab Results  Component Value Date   ALT 15 10/06/2018   AST 26 10/06/2018   ALKPHOS 50 10/06/2018   BILITOT 0.5 10/06/2018   Mean plasma glucose 185  CRP normal   Cbc Lab Results  Component Value Date   WBC 6.8 10/06/2018   HGB 11.2 (L) 10/06/2018   HCT 36.2 10/06/2018   MCV 83.8 10/06/2018   PLT 216 10/06/2018   improved  Sed rate 16  Glucose 285 (had just eaten at [employer])  Lab Results  Component Value Date   HGBA1C 8.1 (H) 10/06/2018   This is up from 7.9 Rarely eats bread  No sweets at all  occ sweet potato or sweet pot or rice  occ mac and cheese  Going out to eat more  Also unable to walk because of knee   Glipizide 2.5 mg  At home most of the time 140s  occ 180s  No low glucose levels in a month   (lowest one day after skipping breakfast 82)   Neg mrsa   ua showed small hb and leukocytes   bp is stable today  No cp or palpitations or headaches or edema  No side effects to medicines  BP Readings from Last 3 Encounters:  10/15/18 106/62  10/06/18 (!) 129/55  06/03/18 116/64    Patient Active Problem List   Diagnosis Date Noted  . Abnormal urinalysis 10/15/2018  . Weight loss 01/13/2018  . Atrial fibrillation ([place]) 11/11/2017  . Routine general medical examination at a  health care facility 11/11/2017  . Renal insufficiency 11/11/2017  . Osteopenia 04/30/2017  . Anemia 02/12/2017  . Vitamin D deficiency 02/03/2017  . Well woman exam 11/05/2016  . Vitamin B12 deficiency 11/05/2016  . Depression 04/19/2016  . Fatigue 02/06/2016  . Frequent loose stools 01/05/2016  . Chest pain 08/26/2013  . Hypertension 01/22/2012  . Coronary heart disease 01/22/2012  . S/P CABG x 3 01/22/2012  . Hyperlipidemia 01/22/2012  . Diabetes mellitus ([place]) 01/22/2012  . Renal artery stenosis ([place]) 01/22/2012   Past Medical History:  Diagnosis Date  . Anxiety   . Arthritis   . Atrial fibrillation ([place])    history  . Coronary artery disease   . Diabetes mellitus    non insulin dependent  . Dyspnea   . GERD (gastroesophageal reflux disease)   . Heart murmur   . Hyperlipidemia   . Hypertension   . Peripheral vascular disease ([place])   . UTI (urinary tract infection)    Past Surgical History:  Procedure Laterality Date  . APPENDECTOMY    .  BREAST BIOPSY Left    20 years ago  . BREAST BIOPSY Right 2008  . BREAST EXCISIONAL BIOPSY     rt 2008 lt "20 years ago"  . CARDIAC CATHETERIZATION    . CATARACT EXTRACTION W/PHACO Left 08/15/2015   Procedure: CATARACT EXTRACTION PHACO AND INTRAOCULAR LENS PLACEMENT (IOC);  Surgeon: Estill Cotta, MD;  Location: [facility] ORS;  Service: Ophthalmology;  Laterality: Left;  [place] 01:28 AP% 26.4 CDE 35.73 fluid pack # [id number]  . COLONOSCOPY WITH PROPOFOL N/A 01/16/2016   Procedure: COLONOSCOPY WITH PROPOFOL;  Surgeon: Manya Silvas, MD;  Location: [facility] ENDOSCOPY;  Service: Endoscopy;  Laterality: N/A;  . CORONARY ANGIOPLASTY WITH STENT PLACEMENT    . CORONARY ARTERY BYPASS GRAFT     with LIMA  to the LAD, SVG to OM1, SVG to PDA  . ESOPHAGOGASTRODUODENOSCOPY (EGD) WITH PROPOFOL N/A 01/16/2016   Procedure: ESOPHAGOGASTRODUODENOSCOPY (EGD) WITH PROPOFOL;  Surgeon: Manya Silvas, MD;  Location: [facility] ENDOSCOPY;  Service: Endoscopy;   Laterality: N/A;  . RENAL ARTERY STENT     left, By Dr Hulda Humphrey  . REPLACEMENT TOTAL KNEE  03/25/2013   right  . TONSILLECTOMY     Social History   Tobacco Use  . Smoking status: Never Smoker  . Smokeless tobacco: Never Used  Substance Use Topics  . Alcohol use: No  . Drug use: No   Family History  Problem Relation Age of Onset  . Heart disease Father   . Other Brother        open heart surgery  . Cancer Brother   . Other Brother        open heart surgery  . Other Brother        open heart surgery  . Hyperlipidemia Daughter   . Breast cancer Neg Hx   . Kidney cancer Neg Hx   . Bladder Cancer Neg Hx    Allergies  Allergen Reactions  . Ciprofloxacin Other (See Comments)    Unknown  . Codeine Nausea Only  . Erythromycin Nausea And Vomiting  . Tramadol Other (See Comments)    Unknown   Current Outpatient Medications on File Prior to Visit  Medication Sig Dispense Refill  . acetaminophen (TYLENOL) 500 MG tablet Take 1,000 mg by mouth every 6 (six) hours as needed for moderate pain or headache.    Marland Kitchen aspirin 81 MG tablet Take 81 mg by mouth daily.     . Biotin 10 MG CAPS Take 10 mg by mouth daily.    Marland Kitchen CALCIUM-MAGNESIUM-ZINC PO Take 1 tablet by mouth 2 (two) times daily.    . clopidogrel (PLAVIX) 75 MG tablet TAKE 1 TABLET BY MOUTH EVERY DAY 90 tablet 3  . cyanocobalamin (,VITAMIN B-12,) 1000 MCG/ML injection INJECT 1ML INTRAMUSCULARLY ONCE MONTHLY AS DIRECTED (Patient taking differently: Inject 1,000 mcg into the muscle every 30 (thirty) days. ) 1 mL 11  . Cyanocobalamin (VITAMIN B12 PO) Take 1 capsule by mouth at bedtime. 5000 mcq    . glucose blood (ONE TOUCH TEST STRIPS) test strip USE AS DIRECTED TO CHECK BLOOD SUGAR TWICE DAILY AND PRN E08.00 100 each 2  . lisinopril (PRINIVIL,ZESTRIL) 20 MG tablet Take 1 tablet (20 mg total) by mouth daily. 90 tablet 3  . metFORMIN (GLUCOPHAGE-XR) 500 MG 24 hr tablet Take 1 tablet (500 mg total) by mouth daily with breakfast. 30 tablet  11  . metoprolol succinate (TOPROL-XL) 25 MG 24 hr tablet Take 1 tablet (25 mg total) by mouth daily. 90 tablet 3  .  ONETOUCH DELICA LANCETS 42V MISC USE AS INSTRUCTED TO TEST BLOOD SUGAR TWICE DAILY AND PRN E08.00 100 each 2  . oral electrolyte (EQUALYTE) solution Take by mouth daily. 1 cap full    . pantoprazole (PROTONIX) 40 MG tablet Take 1 tablet (40 mg total) by mouth daily. 90 tablet 3  . Probiotic Product (PROBIOTIC PO) Take 1 capsule by mouth daily.    . rosuvastatin (CRESTOR) 40 MG tablet TAKE 1 TABLET BY MOUTH EVERY DAY (Patient taking differently: Take 40 mg by mouth at bedtime. ) 90 tablet 3  . nitroGLYCERIN (NITROSTAT) 0.4 MG SL tablet Place 1 tablet (0.4 mg total) under the tongue every 5 (five) minutes as needed for chest pain. 25 tablet 6   No current facility-administered medications on file prior to visit.      Review of Systems  Constitutional: Negative for activity change, appetite change, fatigue, fever and unexpected weight change.  HENT: Negative for congestion, ear pain, rhinorrhea, sinus pressure and sore throat.   Eyes: Negative for pain, redness and visual disturbance.  Respiratory: Negative for cough, shortness of breath and wheezing.   Cardiovascular: Negative for chest pain and palpitations.  Gastrointestinal: Negative for abdominal pain, blood in stool, constipation and diarrhea.  Endocrine: Negative for polydipsia and polyuria.  Genitourinary: Negative for dysuria, frequency and urgency.  Musculoskeletal: Positive for arthralgias. Negative for back pain and myalgias.       Knee pain   Skin: Negative for pallor and rash.  Allergic/Immunologic: Negative for environmental allergies.  Neurological: Negative for dizziness, syncope and headaches.  Hematological: Negative for adenopathy. Does not bruise/bleed easily.  Psychiatric/Behavioral: Negative for decreased concentration and dysphoric mood. The patient is not nervous/anxious.        Objective:    Physical Exam Constitutional:      General: She is not in acute distress.    Appearance: Normal appearance. She is well-developed and normal weight. She is not ill-appearing or diaphoretic.  HENT:     Head: Normocephalic and atraumatic.     Mouth/Throat:     Mouth: Mucous membranes are moist.     Pharynx: Oropharynx is clear.  Eyes:     General: No scleral icterus.    Conjunctiva/sclera: Conjunctivae normal.     Pupils: Pupils are equal, round, and reactive to light.  Neck:     Musculoskeletal: Normal range of motion and neck supple.     Thyroid: No thyromegaly.     Vascular: No carotid bruit or JVD.  Cardiovascular:     Rate and Rhythm: Normal rate and regular rhythm.     Pulses: Normal pulses.     Heart sounds: Normal heart sounds. No gallop.   Pulmonary:     Effort: Pulmonary effort is normal. No respiratory distress.     Breath sounds: Normal breath sounds. No stridor. No wheezing or rales.  Abdominal:     General: Bowel sounds are normal. There is no distension or abdominal bruit.     Palpations: Abdomen is soft. There is no mass.     Tenderness: There is no abdominal tenderness.     Hernia: No hernia is present.     Comments: No suprapubic tenderness or fullness   No cva tenderness   Musculoskeletal:     Right lower leg: No edema.     Left lower leg: No edema.  Lymphadenopathy:     Cervical: No cervical adenopathy.  Skin:    General: Skin is warm and dry.     Capillary  Refill: Capillary refill takes less than 2 seconds.     Coloration: Skin is not pale.     Findings: No rash.  Neurological:     Mental Status: She is alert. Mental status is at baseline.     Deep Tendon Reflexes: Reflexes are normal and symmetric.  Psychiatric:        Mood and Affect: Mood normal.           Assessment & Plan:   Problem List Items Addressed This Visit      Cardiovascular and Mediastinum   Hypertension    bp in fair control at this time  BP Readings from Last 1  Encounters:  10/15/18 106/62   No changes needed Most recent labs reviewed  Disc lifstyle change with low sodium diet and exercise        Renal artery stenosis ([facility])    Doing well  utd f/u with Dr Lucky Cowboy who will see her less often now after a good report  Cr is up today -will check urine cx      Atrial fibrillation ([place])    No clinical changes         Endocrine   Diabetes mellitus ([place]) - Primary    Worse control Lab Results  Component Value Date   HGBA1C 8.1 (H) 10/06/2018   Eating out /too much processed carb intake Will work on this  Also inc glipizide to 5 xl daily  If hypoglycemia will let us know  Need to get this down in order to schedule TKA surgery  F/u 1 mo with glucose log       Relevant Medications   glipiZIDE (GLUCOTROL XL) 5 MG 24 hr tablet     Genitourinary   Renal insufficiency    Lab Results  Component Value Date   CREATININE 1.40 (H) 10/06/2018   This is up and ua was not completely clear  Urine cx pending Has good water intake Hx of renal artery stenosis-improved at last vascular visit less than a year ago Needs improvement to get her TKA surgery      Relevant Orders   Urine Culture     Other   Anemia    Improved last check Lab Results  Component Value Date   WBC 6.8 10/06/2018   HGB 11.2 (L) 10/06/2018   HCT 36.2 10/06/2018   MCV 83.8 10/06/2018   PLT 216 10/06/2018         Abnormal urinalysis    HB and tr leuk  No symptoms Lab Results  Component Value Date   CREATININE 1.40 (H) 10/06/2018   up  Urine cx sent  Enc good fluid intake  No renal toxic meds       Relevant Orders   Urine Culture

## 2018-10-16 LAB — URINE CULTURE
MICRO NUMBER:: 122145
Result:: NO GROWTH
SPECIMEN QUALITY:: ADEQUATE

## 2018-10-16 NOTE — Assessment & Plan Note (Signed)
Worse control Lab Results  Component Value Date   HGBA1C 8.1 (H) 10/06/2018   Eating out /too much processed carb intake Will work on this  Also inc glipizide to 5 xl daily  If hypoglycemia will let us know  Need to get this down in order to schedule TKA surgery  F/u 1 mo with glucose log

## 2018-10-16 NOTE — Assessment & Plan Note (Signed)
HB and tr leuk  No symptoms Lab Results  Component Value Date   CREATININE 1.40 (H) 10/06/2018   up  Urine cx sent  Enc good fluid intake  No renal toxic meds

## 2018-10-16 NOTE — Assessment & Plan Note (Signed)
Lab Results  Component Value Date   CREATININE 1.40 (H) 10/06/2018   This is up and ua was not completely clear  Urine cx pending Has good water intake Hx of renal artery stenosis-improved at last vascular visit less than a year ago Needs improvement to get her TKA surgery

## 2018-10-16 NOTE — Assessment & Plan Note (Signed)
Doing well  utd f/u with Dr Lucky Cowboy who will see her less often now after a good report  Cr is up today -will check urine cx

## 2018-10-16 NOTE — Assessment & Plan Note (Signed)
No clinical changes 

## 2018-10-16 NOTE — Assessment & Plan Note (Signed)
bp in fair control at this time  BP Readings from Last 1 Encounters:  10/15/18 106/62   No changes needed Most recent labs reviewed  Disc lifstyle change with low sodium diet and exercise

## 2018-10-16 NOTE — Assessment & Plan Note (Signed)
Improved last check Lab Results  Component Value Date   WBC 6.8 10/06/2018   HGB 11.2 (L) 10/06/2018   HCT 36.2 10/06/2018   MCV 83.8 10/06/2018   PLT 216 10/06/2018

## 2018-10-20 ENCOUNTER — Telehealth: Payer: Self-pay

## 2018-10-20 ENCOUNTER — Inpatient Hospital Stay: Admission: RE | Admit: 2018-10-20 | Payer: Medicare HMO | Source: Home / Self Care | Admitting: Orthopedic Surgery

## 2018-10-20 ENCOUNTER — Encounter: Admission: RE | Payer: Self-pay | Source: Home / Self Care

## 2018-10-20 SURGERY — ARTHROPLASTY, KNEE, TOTAL, USING IMAGELESS COMPUTER-ASSISTED NAVIGATION
Anesthesia: Choice | Laterality: Left

## 2018-10-20 NOTE — Telephone Encounter (Signed)
Stick with the glipizide at 2.5 mg then  Remember not to skip meals The next step would be a trial of basal insulin (like lantus) -which we would titrate to effect slowly  Is she open to this ?

## 2018-10-20 NOTE — Telephone Encounter (Signed)
Since starting glipizide 5mg  from 2.5 her blood sugar has dropped to the 70s twice in the last few evenings. She felt lightheaded, and syncopal feeling.  Granddaughter told her to take 2.5mg  until talking to Dr tower.  If we need to increase a med, is increasing metformin an option?  Please advise granddaughter.

## 2018-10-23 NOTE — Telephone Encounter (Signed)
Info given to [facility], she will update [place] once they decide

## 2018-11-06 ENCOUNTER — Telehealth: Payer: Self-pay | Admitting: *Deleted

## 2018-11-06 NOTE — Telephone Encounter (Signed)
Aware-spoke to [facility] Doing better  Tracey [name] has not taken over her pill box  Will hold her beta blocker tonight and continue to monitor bp/pulse and how she feels  I will see her early next month (earlier if needed)

## 2018-11-06 NOTE — Telephone Encounter (Signed)
Granddaughter Tracey [name] wanted [name] to be aware of an incident that happened. Pt passed out around 2pm yesterday. Tracey [name] thinks it's due to a mix up with her meds. Pt took 2 metoprolol for 2 days in a row earlier in the week and she also took the metoprolol and the lisinopril together instead of one in am and one in pm. Tracey [name] said her BP was 90s/40s with pulse in upper 40s yesterday. When [place] realized this she had pt hold her BP med today, and pushed fluids last night and monitored her last night. She did well thought the night and her BP was 130/52 this morning. [name] doesn't think she fainted because of her glucose but when they checked later that night it was in the 170s. Pt thought it was due to blood sugar but Tracey [name] thinks she passed out due to BP meds. They have her meds correct in her med box now so this wont happen again. [name] wanted PCP to be aware and to see if there is any other suggestions she needs to do for pt

## 2018-11-09 ENCOUNTER — Telehealth: Payer: Self-pay | Admitting: Family Medicine

## 2018-11-09 DIAGNOSIS — E782 Mixed hyperlipidemia: Secondary | ICD-10-CM

## 2018-11-09 DIAGNOSIS — E559 Vitamin D deficiency, unspecified: Secondary | ICD-10-CM

## 2018-11-09 DIAGNOSIS — D649 Anemia, unspecified: Secondary | ICD-10-CM

## 2018-11-09 DIAGNOSIS — I10 Essential (primary) hypertension: Secondary | ICD-10-CM

## 2018-11-09 DIAGNOSIS — E538 Deficiency of other specified B group vitamins: Secondary | ICD-10-CM

## 2018-11-09 NOTE — Telephone Encounter (Signed)
-----   Message from Eustace Pen, LPN sent at [phone]/52/8413  4:03 PM EST ----- Regarding: Labs 2/27 Lab orders needed. Thank you.

## 2018-11-11 ENCOUNTER — Telehealth: Payer: Self-pay | Admitting: *Deleted

## 2018-11-11 NOTE — Telephone Encounter (Signed)
Tracey [name] notified of Dr. Marliss Coots comments and instructions and verbalized understanding. She will call her cardiologist to make a f/u appt with them

## 2018-11-11 NOTE — Telephone Encounter (Signed)
I recommend she let cardiology know about her heart rate issues, they may want to see her (in the meantime continue cutting the metoprolol) If not feeling better can put her in with me earlier  thsnks for the heads up

## 2018-11-11 NOTE — Telephone Encounter (Signed)
[name] sent me a message on pt. She said:   "Since getting back on her regular doses of lisinopril and metoprolol her bp is running 110/50-60 to 130s/60s. But her heart rate is continuing to drop in the the low 50s and she is feeling weak. I had her cut her metoprolol in half the last 2 nights and her heart rate last pm is 62. She has an appt with PCP later next week for AWV for Leafy Ro is wondering if PCP should see her sooner to discuss her meds or if Leafy Ro should get her in with cardiology to see what to do about her metoprolol. She is just wondering if the metoprolol may need to be adjusted or maybe she is just regulating from her overdose last week. But Leafy Ro is beginning to wonder if her passing out episode may not always be from her bs but maybe from her heart rate?"

## 2018-11-12 ENCOUNTER — Telehealth: Payer: Self-pay | Admitting: Cardiovascular Disease

## 2018-11-12 NOTE — Telephone Encounter (Signed)
New Message  Pt c/o BP issue: STAT if pt c/o blurred vision, one-sided weakness or slurred speech  1. What are your last 5 BP readings?  116/50 62, 124/53 56, 110/54 61 (yesterday) 132 was her blood sugar.  2. Are you having any other symptoms (ex. Dizziness, headache, blurred vision, passed out)?  Blurred vision  3. What is your BP issue? Pt felt her being diabetic it's affecting her bp reading or either her medication is causing her bp issue.  Pt c/o Syncope: STAT if syncope occurred within 30 minutes and pt complains of lightheadedness High Priority if episode of passing out, completely, today or in last 24 hours   1. Did you pass out today? No  2. When is the last time you passed out?  11/05/2018  3. Has this occurred multiple times? No just that one time   4. Did you have any symptoms prior to passing out? Sweaty and felt sugar dropping.   Please f/u with pt due to she's worried about her AWV she has tomorrow.  Pt stressed she feels it her medication.

## 2018-11-12 NOTE — Telephone Encounter (Signed)
Spoke with patient. She is stable today. No chest pain, dizziness or palpitations at the moment. Been having elevated blood sugars as well as an episode of passing out. Dr Alba Cory would like her to see cardiology prior to upcoming annual physicial with her to address anything cardiology prior to her making changes pertaining to diabetes.  Patient also feels occasional irregular heart beating fast. Patient is able to come in tomorrow at 10 am to see Thurmond Butts. She is currently scheduled at 10 am for wellness visit for lab work tomorrow at PCP. They told her they would be able to change that appointment if cardiology had to see her. She will call them now and then I will be able to schedule her. She was very Patent attorney.

## 2018-11-12 NOTE — Progress Notes (Signed)
Cardiology Office Note Date:  11/13/2018  Patient ID:  Tracey [name], Tracey [name] Mar 19, 1936, MRN [id number] PCP:  Abner Greenspan, MD  Cardiologist:  Dr. Rockey Situ, MD    Chief Complaint: Syncope/palpitations   History of Present Illness: Tracey [name] is a 83 y.o. female with history of CAD status post CABG (LIMA to LAD, SVG to OM, SVG to PDA) in 1995 status post subsequent stenting to the graft to OM and 1829 complicated by reported malignant arrhythmia requiring defibrillation during the procedure, reported A. fib not on anticoagulation, diabetes, renal artery stenosis status post stenting in 1998 with redo stenting and 11/2008 by vascular surgery, hypertension, hyperlipidemia, depression, anxiety and GERD who presents for evaluation of syncope and palpitations.  Most recent cardiac cath in 09/2011 showed severe underlying native coronary artery disease with proximal LAD occluded, proximal LCx occluded, proximal RCA occluded with the vein graft to OM being 90% occluded status post PCI/DES.  She was seen in 2017 with DOE that started after colonoscopy as well as palpitations.  In the setting, she underwent nuclear stress testing that showed no significant ischemia with an EF of 55 to 65% overall, this was a low risk study.  Echocardiogram at that time showed an EF of 55 to 60%, normal wall motion, grade 1 diastolic dysfunction, mild mitral regurgitation, mild biatrial enlargement, PASP 42 mmHg.  Outpatient cardiac monitoring in 2017 in the setting of her palpitations showed normal sinus rhythm with no significant arrhythmia.  She was seen in 06/2017 noting continued shortness of breath.  In this setting, she underwent repeat nuclear stress test which showed no significant ischemia, EF 77%, low risk scan.  She was last seen in the office in 03/2018 noted some xiphoid discomfort on palpation but has been present for approximately 1 month and began after she lifted a heavy case of water into the house.  Pain  was also noted to be worse with deep inspiration.  Symptoms were felt to be musculoskeletal in etiology and she was advised to take anti-inflammatory.  Patient contacted PCPs office on 2/20 noting a reported syncopal episode.  Family felt like this was secondary to a mixup in the patient's medications.  Notes indicate the patient took 2 tabs of metoprolol for 2 days in a row earlier in the week as well as taking the metoprolol and lisinopril together instead of 1 in the morning and 1 in the afternoon.  Patient reported BP was noted to be in the 90s over 40s with a pulse in the 40s bpm.  In this setting, the patient's family held the patient's BP meds on 2/20 and push fluids.  With this, BP improved to the 130s over 50s.  Patient's blood glucose during this event was noted to be in the 170s.  Following this, the patient again contacted her PCPs office on 2/25 stating "since getting back on her regular doses of lisinopril and metoprolol her BP is running 110/50-60 to 130s over 60s.  But her heart rate is continuing to drop into the low 50s and she is feeling weak."  In the setting it appears the patient was directed to decrease her metoprolol by half with her heart rate subsequently improving to the low 60s bpm.  Patient comes in today to discuss further.  Labs: 09/2018 - WBC 6.8, Hgb 11.2, PLT 216, potassium 3.9, serum creatinine 1.40, albumin 4.5, AST/ALT normal, A1c 8.1 10/2017 - TSH normal, LDL 64  Patient comes in today noting on 2/15 and 2/16  she ended up taking 50 mg of metoprolol rather than her usual 25 mg.  In this setting, she was asymptomatic.  However on 2/20 she was out with her granddaughter to pick up contacts for her daughter.  Upon arrival to the ophthalmologist office was found they were closed.  She then went next-door to visit with a friend however found out her friend had subsequently quit as she won $4 million in the lottery.  While in that store, the patient began to feel lightheaded and  like she was going to pass out.  She leaned over the counter and subsequently passed out.  She feels like she was out for no more than a few seconds.  There was no associated increased shortness of breath or chest pain.  There was some associated palpitations preceding her syncopal episode.  She indicates she had eaten her usual breakfast as well as a midmorning snack leading up to this episode.  It has been noted the patient's glipizide has recently been decreased from 5 mg daily to 2.5 mg daily in the setting of hypoglycemic events.  Her metoprolol was also decreased from 25 mg daily to 12.5 mg daily.  With this, she has continued to note heart rates in the mid 50s to mid 60s bpm on average.  Following this episode, the patient has been increasing her water intake and has been asymptomatic from a dizziness standpoint.  She does continue to note palpitations, which are more noticeable at nighttime when laying in bed.  There is some associated shortness of breath with the palpitations however no associated dizziness, presyncope, or syncope.  She is currently asymptomatic.  Past Medical History:  Diagnosis Date  . Anxiety   . Arthritis   . Atrial fibrillation ([place])    history  . Coronary artery disease   . Diabetes mellitus    non insulin dependent  . Dyspnea   . GERD (gastroesophageal reflux disease)   . Heart murmur   . Hyperlipidemia   . Hypertension   . Peripheral vascular disease ([place])   . UTI (urinary tract infection)     Past Surgical History:  Procedure Laterality Date  . APPENDECTOMY    . BREAST BIOPSY Left    20 years ago  . BREAST BIOPSY Right 2008  . BREAST EXCISIONAL BIOPSY     rt 2008 lt "20 years ago"  . CARDIAC CATHETERIZATION    . CATARACT EXTRACTION W/PHACO Left 08/15/2015   Procedure: CATARACT EXTRACTION PHACO AND INTRAOCULAR LENS PLACEMENT (IOC);  Surgeon: Estill Cotta, MD;  Location: [facility] ORS;  Service: Ophthalmology;  Laterality: Left;  [place] 01:28 AP% 26.4 CDE  35.73 fluid pack # [id number]  . COLONOSCOPY WITH PROPOFOL N/A 01/16/2016   Procedure: COLONOSCOPY WITH PROPOFOL;  Surgeon: Manya Silvas, MD;  Location: [facility] ENDOSCOPY;  Service: Endoscopy;  Laterality: N/A;  . CORONARY ANGIOPLASTY WITH STENT PLACEMENT    . CORONARY ARTERY BYPASS GRAFT     with LIMA  to the LAD, SVG to OM1, SVG to PDA  . ESOPHAGOGASTRODUODENOSCOPY (EGD) WITH PROPOFOL N/A 01/16/2016   Procedure: ESOPHAGOGASTRODUODENOSCOPY (EGD) WITH PROPOFOL;  Surgeon: Manya Silvas, MD;  Location: [facility] ENDOSCOPY;  Service: Endoscopy;  Laterality: N/A;  . RENAL ARTERY STENT     left, By Dr Hulda Humphrey  . REPLACEMENT TOTAL KNEE  03/25/2013   right  . TONSILLECTOMY      Current Meds  Medication Sig  . acetaminophen (TYLENOL) 500 MG tablet Take 1,000 mg by mouth every 6 (six)  hours as needed for moderate pain or headache.  Marland Kitchen aspirin 81 MG tablet Take 81 mg by mouth daily.   . Biotin 10 MG CAPS Take 10 mg by mouth daily.  Marland Kitchen CALCIUM-MAGNESIUM-ZINC PO Take 1 tablet by mouth 2 (two) times daily.  . clopidogrel (PLAVIX) 75 MG tablet TAKE 1 TABLET BY MOUTH EVERY DAY  . cyanocobalamin (,VITAMIN B-12,) 1000 MCG/ML injection INJECT 1ML INTRAMUSCULARLY ONCE MONTHLY AS DIRECTED (Patient taking differently: Inject 1,000 mcg into the muscle every 30 (thirty) days. )  . Cyanocobalamin (VITAMIN B12 PO) Take 1 capsule by mouth at bedtime. 5000 mcq  . glipiZIDE (GLUCOTROL XL) 5 MG 24 hr tablet Take 1 tablet (5 mg total) by mouth daily with breakfast.  . glucose blood (ONE TOUCH TEST STRIPS) test strip USE AS DIRECTED TO CHECK BLOOD SUGAR TWICE DAILY AND PRN E08.00  . lisinopril (PRINIVIL,ZESTRIL) 20 MG tablet Take 1 tablet (20 mg total) by mouth daily.  . metFORMIN (GLUCOPHAGE-XR) 500 MG 24 hr tablet Take 1 tablet (500 mg total) by mouth daily with breakfast.  . metoprolol succinate (TOPROL-XL) 25 MG 24 hr tablet Take 1 tablet (25 mg total) by mouth daily.  [name] LANCETS 38G MISC USE AS INSTRUCTED TO  TEST BLOOD SUGAR TWICE DAILY AND PRN E08.00  . oral electrolyte (EQUALYTE) solution Take by mouth daily. 1 cap full  . pantoprazole (PROTONIX) 40 MG tablet Take 1 tablet (40 mg total) by mouth daily.  . Probiotic Product (PROBIOTIC PO) Take 1 capsule by mouth daily.  . rosuvastatin (CRESTOR) 40 MG tablet TAKE 1 TABLET BY MOUTH EVERY DAY (Patient taking differently: Take 40 mg by mouth at bedtime. )    Allergies:   Ciprofloxacin; Codeine; Erythromycin; and Tramadol   Social History:  The patient  reports that she has never smoked. She has never used smokeless tobacco. She reports that she does not drink alcohol or use drugs.   Family History:  The patient's family history includes Cancer in her brother; Heart disease in her father; Hyperlipidemia in her daughter; Other in her brother, brother, and brother.  ROS:   Review of Systems  Constitutional: Positive for malaise/fatigue. Negative for chills, diaphoresis, fever and weight loss.  HENT: Negative for congestion.   Eyes: Negative for discharge and redness.  Respiratory: Positive for shortness of breath. Negative for cough, hemoptysis, sputum production and wheezing.   Cardiovascular: Positive for palpitations. Negative for chest pain, orthopnea, claudication, leg swelling and PND.  Gastrointestinal: Negative for abdominal pain, blood in stool, constipation, diarrhea, heartburn, melena, nausea and vomiting.  Genitourinary: Negative for hematuria.  Musculoskeletal: Positive for joint pain. Negative for falls and myalgias.       Left knee pain  Skin: Negative for rash.  Neurological: Positive for dizziness, loss of consciousness and weakness. Negative for tingling, tremors, sensory change, speech change, focal weakness, seizures and headaches.  Endo/Heme/Allergies: Does not bruise/bleed easily.  Psychiatric/Behavioral: Negative for substance abuse. The patient is not nervous/anxious.   All other systems reviewed and are negative.     PHYSICAL EXAM:  VS:  BP 126/68 (BP Location: Left Arm, Patient Position: Sitting, Cuff Size: Normal)   Ht 5\' 3"  (1.6 m)   Wt 129 lb (58.5 kg)   BMI 22.85 kg/m  BMI: Body mass index is 22.85 kg/m.  Physical Exam  Constitutional: She is oriented to person, place, and time. She appears well-developed and well-nourished.  HENT:  Head: Normocephalic and atraumatic.  Eyes: Right eye exhibits no discharge. Left  eye exhibits no discharge.  Neck: Normal range of motion. No JVD present.  Cardiovascular: Normal rate, regular rhythm, S1 normal, S2 normal and normal heart sounds. Exam reveals no distant heart sounds, no friction rub, no midsystolic click and no opening snap.  No murmur heard. Pulses:      Carotid pulses are on the left side with bruit.      Dorsalis pedis pulses are 2+ on the right side and 2+ on the left side.       Posterior tibial pulses are 2+ on the right side and 2+ on the left side.  Pulmonary/Chest: Effort normal and breath sounds normal. No respiratory distress. She has no decreased breath sounds. She has no wheezes. She has no rales. She exhibits no tenderness.  Abdominal: Soft. She exhibits no distension. There is no abdominal tenderness.  Musculoskeletal:        General: No edema.  Neurological: She is alert and oriented to person, place, and time.  Skin: Skin is warm and dry. No cyanosis. Nails show no clubbing.  Psychiatric: She has a normal mood and affect. Her speech is normal and behavior is normal. Judgment and thought content normal.     EKG:  Was ordered and interpreted by me today. Shows NSR, 60 bpm, low voltage QRS, nonspecific ST-T changes, poor R wave progression, unchanged from prior  Recent Labs: 10/06/2018: ALT 15; BUN 36; Creatinine, Ser 1.40; Hemoglobin 11.2; Platelets 216; Potassium 3.9; Sodium 137  No results found for requested labs within last 8760 hours.   CrCl cannot be calculated (Patient's most recent lab result is older than the maximum  21 days allowed.).   Wt Readings from Last 3 Encounters:  11/13/18 129 lb (58.5 kg)  10/06/18 134 lb (60.8 kg)  06/03/18 132 lb 12 oz (60.2 kg)    Orthostatic vital signs:  Lying: 128/65, 67 bpm Sitting: 140/77, 81 bpm Standing: 129/69, 75 bpm, "tad dizzy" Standing x3 minutes: 133/67, 69 bpm  Other studies reviewed: Additional studies/records reviewed today include: summarized above  ASSESSMENT AND PLAN:  1. CAD of the native coronary arteries status post CABG status post PCI without angina: Continue aspirin 81 mg daily and Plavix 75 mg daily.  Transition from Toprol-XL to Coreg as below.  She has not needed any sublingual nitroglycerin.  Remains on Crestor.  Aggressive risk factor modification.  2. Syncope: Of uncertain etiology at this time.  Cannot exclude arrhythmia given associated palpitations.  I would think that if this were related to her accidental ingestion of extra metoprolol the episode would have occurred earlier in the week as it had been 3 days since doubling up on her dose accidentally.  Place outpatient cardiac monitoring, schedule carotid artery ultrasound and echo.  If she is found to have a new cardiomyopathy, would pursue outpatient ischemic evaluation.  3. Palpitations: Certainly could be playing a role in her symptoms.  Schedule live ZIO monitoring to evaluate for significant arrhythmia or pauses contributing to her symptoms.  4. Dyspnea: Associated with palpitations.  Schedule echo and cardiac monitoring as above.  May need further ischemic evaluation.  5. Bradycardia: Heart rates dipping down into the 40s bpm on recent phone notes are likely in the setting of accidental extra metoprolol.  Patient was ambulated in the office today and demonstrated an appropriate chronotropic response with a heart rate peaking at 101 bpm after 3 laps down the hallway.  Given this, patient does not need referral to EP for chronotropic incompetence or pacing.  She does have mildly  bradycardic heart rates while on even low-dose metoprolol 12.5 mg daily.  In this setting, I have discontinued Toprol-XL and placed her on Coreg 3.125 mg twice daily to allow for less beta-blockade.  6. Left-sided carotid bruit: Schedule carotid artery ultrasound.  7. Reported A. Fib: Details are unclear.  Presumably, this occurred in the postoperative setting following her bypass surgery.  Not currently on a full dose oral anticoagulation.  Subsequent cardiac monitoring has not revealed significant arrhythmia as outlined above.  With palpitations, repeat cardiac monitoring as above.  8. Renal artery stenosis: Followed by vascular surgery.  Blood pressure remains well controlled.   9. Hypertension: Blood pressure is well controlled in the office today.  Orthostatic vital signs negative. Change Toprol XL to Coreg as above.  In this setting, I have decreased her lisinopril in an effort to compensate for the added BP effect of Coreg to minimize risk of hypotension/dizziness.   10. Hyperlipidemia: LDL of 64 from 10/2017.  Liver function normal in 09/2018.  Remains on Crestor 40 mg daily.  Disposition: F/u with Dr. Rockey Situ or APP in 1 month.    Current medicines are reviewed at length with the patient today.  The patient did not have any concerns regarding medicines.  Signed, Christell Faith, PA-C 11/13/2018 10:20 AM     [address] [place], [place] [address] [phone]

## 2018-11-13 ENCOUNTER — Encounter: Payer: Self-pay | Admitting: Physician Assistant

## 2018-11-13 ENCOUNTER — Ambulatory Visit: Payer: Medicare HMO

## 2018-11-13 ENCOUNTER — Ambulatory Visit: Payer: Medicare HMO | Admitting: Physician Assistant

## 2018-11-13 VITALS — BP 126/68 | HR 108 | Ht 63.0 in | Wt 129.0 lb

## 2018-11-13 DIAGNOSIS — I2510 Atherosclerotic heart disease of native coronary artery without angina pectoris: Secondary | ICD-10-CM | POA: Diagnosis not present

## 2018-11-13 DIAGNOSIS — I701 Atherosclerosis of renal artery: Secondary | ICD-10-CM

## 2018-11-13 DIAGNOSIS — Z951 Presence of aortocoronary bypass graft: Secondary | ICD-10-CM | POA: Diagnosis not present

## 2018-11-13 DIAGNOSIS — R55 Syncope and collapse: Secondary | ICD-10-CM | POA: Diagnosis not present

## 2018-11-13 DIAGNOSIS — R001 Bradycardia, unspecified: Secondary | ICD-10-CM

## 2018-11-13 DIAGNOSIS — R002 Palpitations: Secondary | ICD-10-CM | POA: Diagnosis not present

## 2018-11-13 DIAGNOSIS — R69 Illness, unspecified: Secondary | ICD-10-CM | POA: Diagnosis not present

## 2018-11-13 DIAGNOSIS — R0989 Other specified symptoms and signs involving the circulatory and respiratory systems: Secondary | ICD-10-CM

## 2018-11-13 DIAGNOSIS — R0602 Shortness of breath: Secondary | ICD-10-CM

## 2018-11-13 DIAGNOSIS — I480 Paroxysmal atrial fibrillation: Secondary | ICD-10-CM | POA: Diagnosis not present

## 2018-11-13 DIAGNOSIS — I10 Essential (primary) hypertension: Secondary | ICD-10-CM | POA: Diagnosis not present

## 2018-11-13 DIAGNOSIS — E785 Hyperlipidemia, unspecified: Secondary | ICD-10-CM

## 2018-11-13 MED ORDER — LISINOPRIL 10 MG PO TABS: 10.0000 mg | ORAL_TABLET | Freq: Every day | ORAL | 3 refills | Status: DC

## 2018-11-13 MED ORDER — CARVEDILOL 3.125 MG PO TABS: 3.1250 mg | ORAL_TABLET | Freq: Two times a day (BID) | ORAL | 3 refills | Status: DC

## 2018-11-13 NOTE — Patient Instructions (Signed)
Medication Instructions:  Your physician has recommended you make the following change in your medication:  1- DECREASE Lisinopril Take 1 tablet (10 mg total) by mouth daily 2- STOP Toprol  3- START Coreg Take 1 tablet (3.125 mg total) by mouth 2 (two) times daily  If you need a refill on your cardiac medications before your next appointment, please call your pharmacy.   Lab work: None ordered   If you have labs (blood work) drawn today and your tests are completely normal, you will receive your results only by: Marland Kitchen MyChart Message (if you have MyChart) OR . A paper copy in the mail If you have any lab test that is abnormal or we need to change your treatment, we will call you to review the results.  Testing/Procedures: 1- Echo Echo  Please return to [facility] on ______________ at _______________ AM/PM for an Echocardiogram. Your physician has requested that you have an echocardiogram. Echocardiography is a painless test that uses sound waves to create images of your heart. It provides your doctor with information about the size and shape of your heart and how well your heart's chambers and valves are working. This procedure takes approximately one hour. There are no restrictions for this procedure. Please note; depending on visual quality an IV may need to be placed.   2- Carotid ultrasound Your physician has requested that you have a carotid duplex. This test is an ultrasound of the carotid arteries in your neck. It looks at blood flow through these arteries that supply the brain with blood. Allow one hour for this exam. There are no restrictions or special instructions.   3- Zio AT  Zio AT: We will place order and you will receive it in the mail.  You may get a call from [place] @ either  [phone] Or  [phone] for them to confirm your address before it will be sent to you.  You will wear the monitor for 14 days, remove it and send it back to the company.  They will send [place] a report. Then we will call you with the results.   Follow-Up: At [facility], you and your health needs are our priority.  As part of our continuing mission to provide you with exceptional heart care, we have created designated Provider Care Teams.  These Care Teams include your primary Cardiologist (physician) and Advanced Practice Providers (APPs -  Physician Assistants and Nurse Practitioners) who all work together to provide you with the care you need, when you need it. You will need a follow up appointment in 1 months.  You may see Dr. Rockey Situ or Christell Faith, PA-C

## 2018-11-16 ENCOUNTER — Ambulatory Visit (INDEPENDENT_AMBULATORY_CARE_PROVIDER_SITE_OTHER): Payer: Medicare HMO

## 2018-11-16 DIAGNOSIS — R55 Syncope and collapse: Secondary | ICD-10-CM | POA: Diagnosis not present

## 2018-11-17 ENCOUNTER — Telehealth: Payer: Self-pay

## 2018-11-17 MED ORDER — ALPRAZOLAM 0.5 MG PO TABS: 0.5000 mg | ORAL_TABLET | Freq: Two times a day (BID) | ORAL | 0 refills | Status: DC | PRN

## 2018-11-17 NOTE — Telephone Encounter (Signed)
I sent it  

## 2018-11-17 NOTE — Telephone Encounter (Signed)
[name] RN has spoken with Dr [name]. pts grandson is critically ill and will likely pass in next few days. Due to increased anxiety and stress request small rx of low dose xanax to [facility].

## 2018-11-17 NOTE — Telephone Encounter (Signed)
[name] notified Rx sent

## 2018-11-21 ENCOUNTER — Encounter: Payer: Medicare HMO | Admitting: Family Medicine

## 2018-12-04 ENCOUNTER — Other Ambulatory Visit: Payer: Self-pay

## 2018-12-07 ENCOUNTER — Other Ambulatory Visit: Payer: Self-pay | Admitting: Family Medicine

## 2018-12-10 ENCOUNTER — Other Ambulatory Visit: Payer: Medicare HMO

## 2018-12-10 ENCOUNTER — Ambulatory Visit: Payer: Medicare HMO

## 2018-12-15 ENCOUNTER — Other Ambulatory Visit: Payer: Medicare HMO

## 2018-12-15 ENCOUNTER — Telehealth: Payer: Self-pay

## 2018-12-15 NOTE — Telephone Encounter (Signed)
Virtual Visit Pre-Appointment Phone Call  Steps For Call:  1. Confirm consent - "In the setting of the current Covid19 crisis, you are scheduled for a phone visit with your provider on 12/25/2018 at 1PM.  Just as we do with many in-office visits, in order for you to participate in this visit, we must obtain consent.  If you'd like, I can send this to your mychart (if signed up) or email for you to review.  Otherwise, I can obtain your verbal consent now.  All virtual visits are billed to your insurance company just like a normal visit would be.  By agreeing to a virtual visit, we'd like you to understand that the technology does not allow for your provider to perform an examination, and thus may limit your provider's ability to fully assess your condition.  Finally, though the technology is pretty good, we cannot assure that it will always work on either your or our end, and in the setting of a video visit, we may have to convert it to a phone-only visit.  In either situation, we cannot ensure that we have a secure connection.  Are you willing to proceed?"  2. Give patient instructions for WebEx download to smartphone as below if video visit  3. Advise patient to be prepared with any vital sign or heart rhythm information, their current medicines, and a piece of paper and pen handy for any instructions they may receive the day of their visit  4. Inform patient they will receive a phone call 15 minutes prior to their appointment time (may be from unknown caller ID) so they should be prepared to answer  5. Confirm that appointment type is correct in Epic appointment notes (video vs telephone)    TELEPHONE CALL NOTE  Tracey [name] has been deemed a candidate for a follow-up tele-health visit to limit community exposure during the Covid-19 pandemic. I spoke with the patient via phone to ensure availability of phone/video source, confirm preferred email & phone number, and discuss instructions and  expectations.  I reminded Tracey [name] to be prepared with any vital sign and/or heart rhythm information that could potentially be obtained via home monitoring, at the time of her visit. I reminded Tracey [name] to expect a phone call at the time of her visit if her visit.  Did the patient verbally acknowledge consent to treatment? YES  Tracey [name] 12/15/2018 1:50 PM  CONSENT FOR TELE-HEALTH VISIT - PLEASE REVIEW  I hereby voluntarily request, consent and authorize [facility] and its employed or contracted physicians, physician assistants, nurse practitioners or other licensed health care professionals (the Practitioner), to provide me with telemedicine health care services (the Services") as deemed necessary by the treating Practitioner. I acknowledge and consent to receive the Services by the Practitioner via telemedicine. I understand that the telemedicine visit will involve communicating with the Practitioner through live audiovisual communication technology and the disclosure of certain medical information by electronic transmission. I acknowledge that I have been given the opportunity to request an in-person assessment or other available alternative prior to the telemedicine visit and am voluntarily participating in the telemedicine visit.  I understand that I have the right to withhold or withdraw my consent to the use of telemedicine in the course of my care at any time, without affecting my right to future care or treatment, and that the Practitioner or I may terminate the telemedicine visit at any time. I understand that I have  the right to inspect all information obtained and/or recorded in the course of the telemedicine visit and may receive copies of available information for a reasonable fee.  I understand that some of the potential risks of receiving the Services via telemedicine include:   Delay or interruption in medical evaluation due to technological equipment  failure or disruption;  Information transmitted may not be sufficient (e.g. poor resolution of images) to allow for appropriate medical decision making by the Practitioner; and/or   In rare instances, security protocols could fail, causing a breach of personal health information.  Furthermore, I acknowledge that it is my responsibility to provide information about my medical history, conditions and care that is complete and accurate to the best of my ability. I acknowledge that Practitioner's advice, recommendations, and/or decision may be based on factors not within their control, such as incomplete or inaccurate data provided by me or distortions of diagnostic images or specimens that may result from electronic transmissions. I understand that the practice of medicine is not an exact science and that Practitioner makes no warranties or guarantees regarding treatment outcomes. I acknowledge that I will receive a copy of this consent concurrently upon execution via email to the email address I last provided but may also request a printed copy by calling the office of [place].    I understand that my insurance will be billed for this visit.   I have read or had this consent read to me.  I understand the contents of this consent, which adequately explains the benefits and risks of the Services being provided via telemedicine.   I have been provided ample opportunity to ask questions regarding this consent and the Services and have had my questions answered to my satisfaction.  I give my informed consent for the services to be provided through the use of telemedicine in my medical care  By participating in this telemedicine visit I agree to the above.

## 2018-12-17 ENCOUNTER — Encounter: Payer: Medicare HMO | Admitting: Family Medicine

## 2018-12-18 ENCOUNTER — Telehealth: Payer: Self-pay | Admitting: Cardiovascular Disease

## 2018-12-18 NOTE — Telephone Encounter (Signed)
Spoke with patient and reviewed prescreening questions for her testing that she has scheduled tomorrow. Advised that it is still scheduled for now and she states that she lives a hour away and would need advanced notice if it is canceled. Let her know that I would document this and to please give [place] a call if any other questions.    COVID-19 Pre-Screening Questions:  . Do you currently have a fever? No (yes = cancel and refer to pcp for e-visit) . Have you recently travelled on a cruise, internationally, or to [place], [place], [place], [place], [place], or [place], [place] [employer]) ? No (yes = cancel, stay home, monitor symptoms, and contact pcp or initiate e-visit if symptoms develop) . Have you been in contact with someone that is currently pending confirmation of Covid19 testing or has been confirmed to have the [place] virus?  No (yes = cancel, stay home, away from tested individual, monitor symptoms, and contact pcp or initiate e-visit if symptoms develop) . Are you currently experiencing fatigue or cough? No (yes = pt should be prepared to have a mask placed at the time of their visit).

## 2018-12-18 NOTE — Telephone Encounter (Signed)
Would keep her on the schedule High risk patient with new syncope

## 2018-12-18 NOTE — Telephone Encounter (Signed)
New Message:   Pt wants to know if she is to keep her appointments tomorrow for her test?

## 2018-12-19 ENCOUNTER — Other Ambulatory Visit: Payer: Self-pay | Admitting: Physician Assistant

## 2018-12-19 ENCOUNTER — Other Ambulatory Visit: Payer: Self-pay

## 2018-12-19 ENCOUNTER — Ambulatory Visit (INDEPENDENT_AMBULATORY_CARE_PROVIDER_SITE_OTHER): Payer: Medicare HMO

## 2018-12-19 DIAGNOSIS — R0602 Shortness of breath: Secondary | ICD-10-CM | POA: Diagnosis not present

## 2018-12-19 DIAGNOSIS — R55 Syncope and collapse: Secondary | ICD-10-CM | POA: Diagnosis not present

## 2018-12-19 DIAGNOSIS — R0989 Other specified symptoms and signs involving the circulatory and respiratory systems: Secondary | ICD-10-CM

## 2018-12-19 NOTE — Telephone Encounter (Signed)
Patient scheduled for today for testing in office and virtual visit fu   Removing msg from pool

## 2018-12-22 ENCOUNTER — Telehealth: Payer: Self-pay

## 2018-12-22 NOTE — Telephone Encounter (Signed)
-----   Message from Rise Mu, PA-C sent at 12/21/2018  7:30 PM EDT ----- Echo showed normal pump function, slight stiffening of the heart, mildly elevated right sided heart pressure, mildly dilated left atrium, mild to moderately leaky mitral valve.   Right sided pressure is improved when compared to echo from 2017.  Mitral valve is slightly more leaky when compared to echo from 2017.  Continue to monitor mitral valve disease. No echocardiographic etiology of syncope.

## 2018-12-22 NOTE — Telephone Encounter (Signed)
Call to patient to review results from ECHO and carotid ultrasound. She verbalized understanding and had no further questions at this time.   Advised pt to call for any further questions or concerns.

## 2018-12-23 ENCOUNTER — Telehealth: Payer: Self-pay

## 2018-12-23 NOTE — Telephone Encounter (Signed)
Spoke with patient.  Verified if she had a smart phone.  Patient does not have a smart phone so she will need a TELEPHONE VISIT

## 2018-12-25 ENCOUNTER — Other Ambulatory Visit: Payer: Self-pay

## 2018-12-25 ENCOUNTER — Telehealth (INDEPENDENT_AMBULATORY_CARE_PROVIDER_SITE_OTHER): Payer: Medicare HMO | Admitting: Cardiovascular Disease

## 2018-12-25 DIAGNOSIS — I471 Supraventricular tachycardia: Secondary | ICD-10-CM | POA: Diagnosis not present

## 2018-12-25 DIAGNOSIS — E1151 Type 2 diabetes mellitus with diabetic peripheral angiopathy without gangrene: Secondary | ICD-10-CM

## 2018-12-25 DIAGNOSIS — I739 Peripheral vascular disease, unspecified: Secondary | ICD-10-CM

## 2018-12-25 DIAGNOSIS — I6522 Occlusion and stenosis of left carotid artery: Secondary | ICD-10-CM | POA: Diagnosis not present

## 2018-12-25 DIAGNOSIS — Z951 Presence of aortocoronary bypass graft: Secondary | ICD-10-CM

## 2018-12-25 DIAGNOSIS — R55 Syncope and collapse: Secondary | ICD-10-CM

## 2018-12-25 DIAGNOSIS — R002 Palpitations: Secondary | ICD-10-CM | POA: Insufficient documentation

## 2018-12-25 DIAGNOSIS — Z95820 Peripheral vascular angioplasty status with implants and grafts: Secondary | ICD-10-CM

## 2018-12-25 DIAGNOSIS — E0800 Diabetes mellitus due to underlying condition with hyperosmolarity without nonketotic hyperglycemic-hyperosmolar coma (NKHHC): Secondary | ICD-10-CM

## 2018-12-25 MED ORDER — CARVEDILOL 3.125 MG PO TABS: 3.1250 mg | ORAL_TABLET | ORAL | 3 refills | Status: DC

## 2018-12-25 NOTE — Patient Instructions (Addendum)
Medication Instructions:  Your physician has recommended you make the following change in your medication:  1. CONTINUE Carvedilol 3.125 mg 1 tablet twice a day with extra 1 tablet as needed for palpitations.  If you need a refill on your cardiac medications before your next appointment, please call your pharmacy.    Lab work: No new labs needed   If you have labs (blood work) drawn today and your tests are completely normal, you will receive your results only by: Marland Kitchen MyChart Message (if you have MyChart) OR . A paper copy in the mail If you have any lab test that is abnormal or we need to change your treatment, we will call you to review the results.   Testing/Procedures: No new testing needed   Follow-Up: At [facility], you and your health needs are our priority.  As part of our continuing mission to provide you with exceptional heart care, we have created designated Provider Care Teams.  These Care Teams include your primary Cardiologist (physician) and Advanced Practice Providers (APPs -  Physician Assistants and Nurse Practitioners) who all work together to provide you with the care you need, when you need it.  . You will need a follow up appointment in 6 months .   Please call our office 2 months in advance to schedule this appointment.    . Providers on your designated Care Team:   . Murray Hodgkins, NP . Christell Faith, PA-C . Marrianne Mood, PA-C  Any Other Special Instructions Will Be Listed Below (If Applicable).  For educational health videos Log in to : [email or web address] Or : [email or web address], password : [email or web address]

## 2018-12-25 NOTE — Progress Notes (Signed)
Telephone Visit     Evaluation Performed:  Follow-up visit  This visit type was conducted due to national recommendations for restrictions regarding the COVID-19 Pandemic (e.g. social distancing).  This format is felt to be most appropriate for this patient at this time.  All issues noted in this document were discussed and addressed.  No physical exam was performed (except for noted visual exam findings with Telehealth visits).  See MyChart message from today for the patient's consent to telehealth for [facility].  Date:  12/25/2018   ID:  Julio Sicks, DOB 10-Jun-1936, MRN [id number]  Patient Location:  [address] [place] [address]   Provider location:   [facility], [place] office  PCP:  Abner Greenspan, MD  Cardiologist:  Patsy Baltimore  Chief Complaint:  Dizzy, palpitations    History of Present Illness:    Tracey [name] is a 83 y.o. female who presents via audio/video conferencing for a telehealth visit today.   The patient does not symptoms concerning for COVID-19 infection (fever, chills, cough, or new SHORTNESS OF BREATH).   Patient has a past medical history of coronary artery disease, bypass surgery in 1995,  diabetes with hemoglobin A1c 7.0,  stent to her graft to the OM several years ago with chest pain January 2013  repeat stenting to the graft to the OM with a Xience 3.5 x 18 mm stent (patient and husband report that she required delivery of a shock for resuscitation during the procedure),  left renal PCI in 1998, redo PCI March 2010 followed by [name],  who presents for routine followup of her coronary artery disease  She lost her husband in 2016  Lives alone,  Walks around the yard Knee replacement delayed.  HBA1C 8.1 Working on the diet  Sometimes heart rate goes up: sometimes Tachy  One epsiode of near syncope, Syncope, for one second Possible vagal  Otherwise denies significant shortness of breath chest pain  Denies any tachycardia or palpitations Granddaughter visits her on a regular basis=  grandson who is 83 years old with alcohol addiction in and out of rehab  Lab work reviewed with her in detail Total chol 127, LDL 64 HBA1C 8.1   Prior CV studies:   The following studies were reviewed today:  Carotid Carotid artery ultrasound showed 1-39% RICA stenosis and 70-35% LICA stenosis.   Echo: Normal EF 65% RVSP 32 Mild to moderate MR   Past Medical History:  Diagnosis Date  . Anxiety   . Arthritis   . Atrial fibrillation ([place])    history  . Coronary artery disease   . Diabetes mellitus    non insulin dependent  . Dyspnea   . GERD (gastroesophageal reflux disease)   . Heart murmur   . Hyperlipidemia   . Hypertension   . Peripheral vascular disease ([place])   . UTI (urinary tract infection)    Past Surgical History:  Procedure Laterality Date  . APPENDECTOMY    . BREAST BIOPSY Left    20 years ago  . BREAST BIOPSY Right 2008  . BREAST EXCISIONAL BIOPSY     rt 2008 lt "20 years ago"  . CARDIAC CATHETERIZATION    . CATARACT EXTRACTION W/PHACO Left 08/15/2015   Procedure: CATARACT EXTRACTION PHACO AND INTRAOCULAR LENS PLACEMENT (IOC);  Surgeon: Estill Cotta, MD;  Location: [facility] ORS;  Service: Ophthalmology;  Laterality: Left;  [place] 01:28 AP% 26.4 CDE 35.73 fluid pack # [id number]  . COLONOSCOPY WITH  PROPOFOL N/A 01/16/2016   Procedure: COLONOSCOPY WITH PROPOFOL;  Surgeon: Manya Silvas, MD;  Location: [facility] ENDOSCOPY;  Service: Endoscopy;  Laterality: N/A;  . CORONARY ANGIOPLASTY WITH STENT PLACEMENT    . CORONARY ARTERY BYPASS GRAFT     with LIMA  to the LAD, SVG to OM1, SVG to PDA  . ESOPHAGOGASTRODUODENOSCOPY (EGD) WITH PROPOFOL N/A 01/16/2016   Procedure: ESOPHAGOGASTRODUODENOSCOPY (EGD) WITH PROPOFOL;  Surgeon: Manya Silvas, MD;  Location: [facility] ENDOSCOPY;  Service: Endoscopy;  Laterality: N/A;  . RENAL ARTERY STENT     left, By Dr Hulda Humphrey  . REPLACEMENT TOTAL  KNEE  03/25/2013   right  . TONSILLECTOMY       No outpatient medications have been marked as taking for the 12/25/18 encounter (Appointment) with Minna Merritts, MD.     Allergies:   Ciprofloxacin; Codeine; Erythromycin; and Tramadol   Social History   Tobacco Use  . Smoking status: Never Smoker  . Smokeless tobacco: Never Used  Substance Use Topics  . Alcohol use: No  . Drug use: No     Current Outpatient Medications on File Prior to Visit  Medication Sig Dispense Refill  . acetaminophen (TYLENOL) 500 MG tablet Take 1,000 mg by mouth every 6 (six) hours as needed for moderate pain or headache.    . ALPRAZolam (XANAX) 0.5 MG tablet Take 1 tablet (0.5 mg total) by mouth 2 (two) times daily as needed for anxiety. Caution of sedation 20 tablet 0  . aspirin 81 MG tablet Take 81 mg by mouth daily.     . Biotin 10 MG CAPS Take 10 mg by mouth daily.    Marland Kitchen CALCIUM-MAGNESIUM-ZINC PO Take 1 tablet by mouth 2 (two) times daily.    . carvedilol (COREG) 3.125 MG tablet Take 1 tablet (3.125 mg total) by mouth 2 (two) times daily. 180 tablet 3  . clopidogrel (PLAVIX) 75 MG tablet TAKE 1 TABLET BY MOUTH EVERY DAY 90 tablet 3  . cyanocobalamin (,VITAMIN B-12,) 1000 MCG/ML injection INJECT 1ML INTRAMUSCULARLY ONCE MONTHLY AS DIRECTED (Patient taking differently: Inject 1,000 mcg into the muscle every 30 (thirty) days. ) 1 mL 11  . Cyanocobalamin (VITAMIN B12 PO) Take 1 capsule by mouth at bedtime. 5000 mcq    . glipiZIDE (GLUCOTROL XL) 5 MG 24 hr tablet Take 1 tablet (5 mg total) by mouth daily with breakfast. (Patient taking differently: Take 2.5 mg by mouth daily with breakfast. ) 30 tablet 11  . glucose blood (ONE TOUCH TEST STRIPS) test strip USE AS DIRECTED TO CHECK BLOOD SUGAR TWICE DAILY AND PRN E08.00 100 each 2  . lisinopril (PRINIVIL,ZESTRIL) 10 MG tablet Take 1 tablet (10 mg total) by mouth daily. 90 tablet 3  . metFORMIN (GLUCOPHAGE-XR) 500 MG 24 hr tablet Take 1 tablet (500 mg total) by  mouth daily with breakfast. 30 tablet 11  . nitroGLYCERIN (NITROSTAT) 0.4 MG SL tablet Place 1 tablet (0.4 mg total) under the tongue every 5 (five) minutes as needed for chest pain. 25 tablet 6  . ONETOUCH DELICA LANCETS 59D MISC USE AS INSTRUCTED TO TEST BLOOD SUGAR TWICE DAILY AND PRN E08.00 100 each 2  . oral electrolyte (EQUALYTE) solution Take by mouth daily. 1 cap full    . pantoprazole (PROTONIX) 40 MG tablet Take 1 tablet (40 mg total) by mouth daily. 90 tablet 3  . Probiotic Product (PROBIOTIC PO) Take 1 capsule by mouth daily.    . rosuvastatin (CRESTOR) 40 MG tablet TAKE 1 TABLET  BY MOUTH EVERY DAY (Patient taking differently: Take 40 mg by mouth at bedtime. ) 90 tablet 3   No current facility-administered medications on file prior to visit.      Family Hx: The patient's family history includes Cancer in her brother; Heart disease in her father; Hyperlipidemia in her daughter; Other in her brother, brother, and brother. There is no history of Breast cancer, Kidney cancer, or Bladder Cancer.  ROS:   Please see the history of present illness.    Review of Systems  Constitutional: Negative.   Respiratory: Negative.   Cardiovascular: Positive for palpitations.       Tach  Gastrointestinal: Negative.   Musculoskeletal: Negative.   Neurological: Negative.   Psychiatric/Behavioral: Negative.   All other systems reviewed and are negative.    Labs/Other Tests and Data Reviewed:    Recent Labs: 10/06/2018: ALT 15; BUN 36; Creatinine, Ser 1.40; Hemoglobin 11.2; Platelets 216; Potassium 3.9; Sodium 137   Recent Lipid Panel Lab Results  Component Value Date/Time   CHOL 127 11/06/2017 10:02 AM   CHOL 141 10/08/2011 04:57 AM   TRIG 144.0 11/06/2017 10:02 AM   TRIG 179 10/08/2011 04:57 AM   HDL 34.00 (L) 11/06/2017 10:02 AM   HDL 37 (L) 10/08/2011 04:57 AM   CHOLHDL 4 11/06/2017 10:02 AM   LDLCALC 64 11/06/2017 10:02 AM   LDLCALC 68 10/08/2011 04:57 AM    Wt Readings from  Last 3 Encounters:  11/13/18 129 lb (58.5 kg)  10/06/18 134 lb (60.8 kg)  06/03/18 132 lb 12 oz (60.2 kg)     Exam:    Vital Signs: Vital signs may also be detailed in the HPI BP 120s Pulse 77, up to 99 Oxygen >95  Well nourished, well developed female in no acute distress. Constitutional:  oriented to person, place, and time. No distress.   ASSESSMENT & PLAN:    SVT Rare episodes, Suggested she take extra coreg as needed for breakthrough arrhythmia  Syncope, unspecified syncope type Suspect vasovagal As it presented in the setting of GI upset Recommended she call us if she has any further episodes Discussed strategies if she has additional episodes such as laying down if she gets dizzy  PAD (peripheral artery disease) ([place]) Carotid ultrasound studies discussed with her in detail  Stenosis of left carotid artery Results discussed with her in detail 40% stenosis on the left Cholesterol at goal Recommend she continue to work on her diabetes numbers  Diabetes type 2 with complications She has changed her diet, cut out all her carbohydrates Plan to get hemoglobin A1c down to 7.  C was greater than 8  COVID-19 Education: The signs and symptoms of COVID-19 were discussed with the patient and how to seek care for testing (follow up with PCP or arrange E-visit).  The importance of social distancing was discussed today.  Patient Risk:   After full review of this patients clinical status, I feel that they are at least moderate risk at this time.  Time:   Today, I have spent 25 minutes with the patient with telehealth technology discussing the cardiac and medical problems/diagnoses detailed above   Discussed recent syncope etiology, PAD disease of the carotids, Diabetes management   Medication Adjustments/Labs and Tests Ordered: Current medicines are reviewed at length with the patient today.  Concerns regarding medicines are outlined above.   Tests Ordered: No tests  ordered   Medication Changes: No changes made   Disposition: Follow-up in 6 months   Signed,  Tracey Rogue, MD  12/25/2018 8:34 AM    [place] Office [address], [place], [place] [address]

## 2018-12-26 ENCOUNTER — Other Ambulatory Visit: Payer: Self-pay | Admitting: Family Medicine

## 2018-12-29 ENCOUNTER — Ambulatory Visit: Payer: Medicare HMO | Admitting: Cardiovascular Disease

## 2019-01-16 ENCOUNTER — Telehealth: Payer: Self-pay | Admitting: *Deleted

## 2019-01-16 NOTE — Telephone Encounter (Signed)
Patient's granddaughter Tracey [name]) wanted to relay a message to [name] per [name]. After gardening she has developed pain and inflammation in right and left thumb/wrist joints. There is some warmth and minimal redness.  She is taking tylenol 500 and warm soaks with epson salts. Patient wants to know if she should do anything else for pain/inflamation?  There is no swelling and no history of gout. Left area has improved some since last pm, but right is still bothersome.  Update on blood sugar numbers AM readings range from 105-135 PM range is 130-150 Overall blood sugars are doing much better and no more spells. Please call the patient and advise her of recommendations. Pharmacy [facility]

## 2019-01-16 NOTE — Telephone Encounter (Signed)
Thanks for the update -I'm glad blood glucose is better  Her hands may be inflamed from recent activity  Tylenol and epsom salt soaks are a great idea  If a cold compress helps she can try that to  No nsaids -too hard on kidney  Keep doing what you are doing and take a break from gardening If worse- more red/pain /swelling- alert [place]  If no further improvement by Monday- make appt to come in so we can look at her Thanks

## 2019-01-16 NOTE — Telephone Encounter (Signed)
Called and spoke with pt. Pt advised and voiced understanding, Stated hand is feeling better. Will call back if needed.

## 2019-01-23 ENCOUNTER — Telehealth: Payer: Self-pay

## 2019-01-23 NOTE — Telephone Encounter (Signed)
Tracey [name] pts granddaughter and RN at [facility]; pts grandmother gave permission to talk with Leafy Ro; pt having increased lt knee pain, likely result of over compensating for rt knee pain which needs replacement but surgery has been postponed due to covid guidelines. Tylenol is ineffective;pt taking plavix and is allergie to Tramadol. What other option for pain control? Leafy Ro spoke with Dr [name] and Leafy Ro was advised by Dr [name] that Dr [name] recommends OTC Voltaren gel. Leafy Ro will let pt be aware. FYI to Dr [name].

## 2019-01-24 ENCOUNTER — Other Ambulatory Visit: Payer: Self-pay | Admitting: Cardiovascular Disease

## 2019-01-27 ENCOUNTER — Other Ambulatory Visit: Payer: Self-pay | Admitting: Family Medicine

## 2019-01-29 ENCOUNTER — Other Ambulatory Visit: Payer: Self-pay | Admitting: Family Medicine

## 2019-02-01 ENCOUNTER — Other Ambulatory Visit: Payer: Self-pay | Admitting: Family Medicine

## 2019-02-02 ENCOUNTER — Other Ambulatory Visit: Payer: Self-pay | Admitting: Family Medicine

## 2019-03-04 ENCOUNTER — Ambulatory Visit: Payer: Medicare HMO

## 2019-03-05 ENCOUNTER — Encounter: Payer: Medicare HMO | Admitting: Family Medicine

## 2019-03-05 ENCOUNTER — Other Ambulatory Visit: Payer: Self-pay

## 2019-03-05 ENCOUNTER — Other Ambulatory Visit (INDEPENDENT_AMBULATORY_CARE_PROVIDER_SITE_OTHER): Payer: Medicare HMO

## 2019-03-05 DIAGNOSIS — E782 Mixed hyperlipidemia: Secondary | ICD-10-CM

## 2019-03-05 DIAGNOSIS — E538 Deficiency of other specified B group vitamins: Secondary | ICD-10-CM | POA: Diagnosis not present

## 2019-03-05 DIAGNOSIS — E559 Vitamin D deficiency, unspecified: Secondary | ICD-10-CM | POA: Diagnosis not present

## 2019-03-05 DIAGNOSIS — D649 Anemia, unspecified: Secondary | ICD-10-CM

## 2019-03-05 DIAGNOSIS — I10 Essential (primary) hypertension: Secondary | ICD-10-CM | POA: Diagnosis not present

## 2019-03-05 LAB — CBC WITH DIFFERENTIAL/PLATELET
Basophils Absolute: 0 10*3/uL (ref 0.0–0.1)
Basophils Relative: 0.4 % (ref 0.0–3.0)
Eosinophils Absolute: 0.1 10*3/uL (ref 0.0–0.7)
Eosinophils Relative: 1.4 % (ref 0.0–5.0)
HCT: 31.3 % — ABNORMAL LOW (ref 36.0–46.0)
Hemoglobin: 10.2 g/dL — ABNORMAL LOW (ref 12.0–15.0)
Lymphocytes Relative: 26 % (ref 12.0–46.0)
Lymphs Abs: 1.7 10*3/uL (ref 0.7–4.0)
MCHC: 32.5 g/dL (ref 30.0–36.0)
MCV: 80.3 fl (ref 78.0–100.0)
Monocytes Absolute: 0.5 10*3/uL (ref 0.1–1.0)
Monocytes Relative: 8 % (ref 3.0–12.0)
Neutro Abs: 4.3 10*3/uL (ref 1.4–7.7)
Neutrophils Relative %: 64.2 % (ref 43.0–77.0)
Platelets: 193 10*3/uL (ref 150.0–400.0)
RBC: 3.9 Mil/uL (ref 3.87–5.11)
RDW: 15 % (ref 11.5–15.5)
WBC: 6.6 10*3/uL (ref 4.0–10.5)

## 2019-03-05 LAB — COMPREHENSIVE METABOLIC PANEL
ALT: 10 U/L (ref 0–35)
AST: 16 U/L (ref 0–37)
Albumin: 4.4 g/dL (ref 3.5–5.2)
Alkaline Phosphatase: 44 U/L (ref 39–117)
BUN: 37 mg/dL — ABNORMAL HIGH (ref 6–23)
CO2: 27 mEq/L (ref 19–32)
Calcium: 10.6 mg/dL — ABNORMAL HIGH (ref 8.4–10.5)
Chloride: 106 mEq/L (ref 96–112)
Creatinine, Ser: 1.11 mg/dL (ref 0.40–1.20)
GFR: 47.23 mL/min — ABNORMAL LOW (ref >60.00)
Glucose, Bld: 139 mg/dL — ABNORMAL HIGH (ref 70–99)
Potassium: 4.5 mEq/L (ref 3.5–5.1)
Sodium: 141 mEq/L (ref 135–145)
Total Bilirubin: 0.5 mg/dL (ref 0.2–1.2)
Total Protein: 6.4 g/dL (ref 6.0–8.3)

## 2019-03-05 LAB — LIPID PANEL
Cholesterol: 161 mg/dL (ref 0–200)
HDL: 46.9 mg/dL (ref >39.00)
LDL Cholesterol: 85 mg/dL (ref 0–99)
NonHDL: 114.42
Total CHOL/HDL Ratio: 3
Triglycerides: 145 mg/dL (ref 0.0–149.0)
VLDL: 29 mg/dL (ref 0.0–40.0)

## 2019-03-05 LAB — VITAMIN B12: Vitamin B-12: >1515 pg/mL — ABNORMAL HIGH (ref 211–911)

## 2019-03-05 LAB — VITAMIN D 25 HYDROXY (VIT D DEFICIENCY, FRACTURES): VITD: 46.6 ng/mL (ref 30.00–100.00)

## 2019-03-05 LAB — TSH: TSH: 2.68 u[IU]/mL (ref 0.35–4.50)

## 2019-03-06 ENCOUNTER — Other Ambulatory Visit (INDEPENDENT_AMBULATORY_CARE_PROVIDER_SITE_OTHER): Payer: Medicare HMO

## 2019-03-06 DIAGNOSIS — E0800 Diabetes mellitus due to underlying condition with hyperosmolarity without nonketotic hyperglycemic-hyperosmolar coma (NKHHC): Secondary | ICD-10-CM

## 2019-03-06 LAB — HEMOGLOBIN A1C: Hgb A1c MFr Bld: 8.3 % — ABNORMAL HIGH (ref 4.6–6.5)

## 2019-03-10 ENCOUNTER — Encounter: Payer: Medicare HMO | Admitting: Family Medicine

## 2019-03-30 DIAGNOSIS — M1712 Unilateral primary osteoarthritis, left knee: Secondary | ICD-10-CM | POA: Diagnosis not present

## 2019-03-30 DIAGNOSIS — M25562 Pain in left knee: Secondary | ICD-10-CM | POA: Diagnosis not present

## 2019-04-01 ENCOUNTER — Ambulatory Visit (INDEPENDENT_AMBULATORY_CARE_PROVIDER_SITE_OTHER): Payer: Medicare HMO | Admitting: Family Medicine

## 2019-04-01 ENCOUNTER — Other Ambulatory Visit: Payer: Self-pay

## 2019-04-01 ENCOUNTER — Encounter: Payer: Self-pay | Admitting: Family Medicine

## 2019-04-01 VITALS — BP 130/62 | HR 64 | Temp 98.0°F | Ht 62.25 in | Wt 130.2 lb

## 2019-04-01 DIAGNOSIS — E782 Mixed hyperlipidemia: Secondary | ICD-10-CM

## 2019-04-01 DIAGNOSIS — I701 Atherosclerosis of renal artery: Secondary | ICD-10-CM

## 2019-04-01 DIAGNOSIS — I480 Paroxysmal atrial fibrillation: Secondary | ICD-10-CM | POA: Diagnosis not present

## 2019-04-01 DIAGNOSIS — N289 Disorder of kidney and ureter, unspecified: Secondary | ICD-10-CM | POA: Diagnosis not present

## 2019-04-01 DIAGNOSIS — Z0000 Encounter for general adult medical examination without abnormal findings: Secondary | ICD-10-CM

## 2019-04-01 DIAGNOSIS — D649 Anemia, unspecified: Secondary | ICD-10-CM | POA: Diagnosis not present

## 2019-04-01 DIAGNOSIS — E559 Vitamin D deficiency, unspecified: Secondary | ICD-10-CM

## 2019-04-01 DIAGNOSIS — I10 Essential (primary) hypertension: Secondary | ICD-10-CM | POA: Diagnosis not present

## 2019-04-01 DIAGNOSIS — E0800 Diabetes mellitus due to underlying condition with hyperosmolarity without nonketotic hyperglycemic-hyperosmolar coma (NKHHC): Secondary | ICD-10-CM | POA: Diagnosis not present

## 2019-04-01 DIAGNOSIS — I739 Peripheral vascular disease, unspecified: Secondary | ICD-10-CM

## 2019-04-01 DIAGNOSIS — M8580 Other specified disorders of bone density and structure, unspecified site: Secondary | ICD-10-CM | POA: Diagnosis not present

## 2019-04-01 DIAGNOSIS — I25118 Atherosclerotic heart disease of native coronary artery with other forms of angina pectoris: Secondary | ICD-10-CM

## 2019-04-01 DIAGNOSIS — E538 Deficiency of other specified B group vitamins: Secondary | ICD-10-CM

## 2019-04-01 DIAGNOSIS — R55 Syncope and collapse: Secondary | ICD-10-CM

## 2019-04-01 NOTE — Assessment & Plan Note (Signed)
Clinically stable Followed by vascular

## 2019-04-01 NOTE — Assessment & Plan Note (Signed)
dexa 5/18 No falls or fx Disc need for calcium/ vitamin D/ wt bearing exercise and bone density test every 2 y to monitor Disc safety/ fracture risk in detail    Will hold on on next dexa due to pandemic

## 2019-04-01 NOTE — Assessment & Plan Note (Signed)
Numbers are improved More anemic however

## 2019-04-01 NOTE — Assessment & Plan Note (Signed)
Clinically stable. Followed by cardiology.  

## 2019-04-01 NOTE — Patient Instructions (Addendum)
Don't forget to get your mammogram ! If you need help with this let us know  Get your flu shot in the fall!!  If you are interested in the new shingles vaccine (Shingrix) - call your local pharmacy to check on coverage and availability  If affordable, get on a wait list at your pharmacy to get the vaccine.   Anemia is a little worse Let's re check in a month with more iron tests  The office will call you regarding endocrinology referral

## 2019-04-01 NOTE — Assessment & Plan Note (Signed)
Lab Results  Component Value Date   HGBA1C 8.3 (H) 03/06/2019   Unable to get controlled w/o causing hypoglycemia  In addition to this- metformin xr will not be available due to recall  Ref to endocrinology  Diet is good  On ace and statin  Will get eye exam when she can schedule it

## 2019-04-01 NOTE — Assessment & Plan Note (Signed)
Pt has had dizzy episodes w/o LOC Had cardiac monitor and continues cardiology f/u Feels fine today

## 2019-04-01 NOTE — Assessment & Plan Note (Signed)
Level good in 40s Vitamin D level is therapeutic with current supplementation Disc importance of this to bone and overall health

## 2019-04-01 NOTE — Assessment & Plan Note (Signed)
B12 level is high Will stop oral supp Change shots to every other mo

## 2019-04-01 NOTE — Assessment & Plan Note (Signed)
Pt has gone in and out -per hx/ not severe  Continues cardiology f/u and plavix

## 2019-04-01 NOTE — Assessment & Plan Note (Signed)
bp in fair control at this time  BP Readings from Last 1 Encounters:  04/01/19 130/62   No changes needed Most recent labs reviewed  Disc lifstyle change with low sodium diet and exercise

## 2019-04-01 NOTE — Assessment & Plan Note (Signed)
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

## 2019-04-01 NOTE — Assessment & Plan Note (Signed)
Hb down to 10.2 Unsure if iron def or related to DM or renal insuff/chronic dz Will plan a re check with ferritin No GI symptoms

## 2019-04-01 NOTE — Progress Notes (Signed)
Subjective:    Patient ID: Tracey [name], female    DOB: [birth year], 83 y.o.   MRN: [id number]  HPI  Pt presents for amw and health mt /also review chronic health problems  I have personally reviewed the Medicare Annual Wellness questionnaire and have noted 1. The patient's medical and social history 2. Their use of alcohol, tobacco or illicit drugs 3. Their current medications and supplements 4. The patient's functional ability including ADL's, fall risks, home safety risks and hearing or visual             impairment. 5. Diet and physical activities 6. Evidence for depression or mood disorders  The patients weight, height, BMI have been recorded in the chart and visual acuity is per eye clinic.  I have made referrals, counseling and provided education to the patient based review of the above and I have provided the pt with a written personalized care plan for preventive services. Reviewed and updated provider list, see scanned forms.  See scanned forms.  Routine anticipatory guidance given to patient.  See health maintenance. Colon cancer screening 83 yo/out aged -- 2017  Breast cancer screening mammogram 3/19 nl - she got her reminder letter  Self breast exam Gyn- sees Melody Burr-update  Flu vaccine 9/19  Tetanus vaccine 7/14 Td Pneumovax-completed Zoster vaccine-interested/has had shingles before  dexa 5/18 Osteopenia  D level is 46.6 (stable) Falls-none  Fractures -none  Supplements - taking D and ca  Advance directive: has a living will and POA   POA is [employer]  Cognitive function addressed- see scanned forms- and if abnormal then additional documentation follows.  She is not concerned -age related change  Mentally sharp for the most part She manages her home and finances w/o problem  Not getting lost or confused   PMH and SH reviewed  Meds, vitals, and allergies reviewed.   ROS: See HPI.  Otherwise negative.   A grandson died of cirrhosis (etoh and  drugs)   Staying in during the pandemic   Has arthritis in her hands - bothersome Still putting off knee replacement (getting fitted for a knee brace)    Has had several spells of "almost fainting" -not associated with low blood sugars  Cardiology is aware  Wore a heart monitor - watching this  No LOC   Last episode was on Saturday  Was sitting in a chair  Room was spinning - worse to turn her head  Was very brief  No ear pain  No uri symptoms   Weight Wt Readings from Last 3 Encounters:  04/01/19 130 lb 3 oz (59.1 kg)  11/13/18 129 lb (58.5 kg)  10/06/18 134 lb (60.8 kg)   23.62 kg/m   PHQ: Not hopeless or sad  Frustrated at times- has occ loss of interest or motivation   Vision/hearing  Hearing Screening   125Hz  250Hz  500Hz  1000Hz  2000Hz  3000Hz  4000Hz  6000Hz  8000Hz   Right ear:   40 40 0  0    Left ear:   0 0   0    Vision Screening Comments: Pt has yearly eye exam with Dr. Sandra Cockayne, will scheduled appt once Covid-19 is better does not hear out of L ear at all    bp is stable today  No cp or palpitations or headaches or edema  No side effects to medicines  BP Readings from Last 3 Encounters:  04/01/19 140/68  11/13/18 126/68  10/15/18 106/62     Pt has CAD as  well as PAD and renal artery stenosis and PAF She goes into a fib on and off  Followed by vascular and cardiology  Taking plavix   Pulse Readings from Last 3 Encounters:  04/01/19 64  11/13/18 (!) 108  10/15/18 73   Baseline renal insuff Lab Results  Component Value Date   CREATININE 1.11 03/05/2019   BUN 37 (H) 03/05/2019   NA 141 03/05/2019   K 4.5 03/05/2019   CL 106 03/05/2019   CO2 27 03/05/2019   Improved (last cr 1.4) GFR 47.2 Ca level 10.6   Lab Results  Component Value Date   ALT 10 03/05/2019   AST 16 03/05/2019   ALKPHOS 44 03/05/2019   BILITOT 0.5 03/05/2019     DM2 Lab Results  Component Value Date   HGBA1C 8.3 (H) 03/06/2019   this is up from 8.1 Glucose 130s   160s 2 hours after she eats   Once in a while higher  No more low glucose  Diet -better  Avoiding pasta and potato  No sweets  No fried foods  Drinks unsweet tea Unable to exercise much due to the knee (stays active)- uses exercise bands for upper body and some knee PT exercises  Could not tolerate higher glipizide dose  Metformin xr  Lisinopril for renal protection  On statin   Chronic anemia Lab Results  Component Value Date   WBC 6.6 03/05/2019   HGB 10.2 (L) 03/05/2019   HCT 31.3 (L) 03/05/2019   MCV 80.3 03/05/2019   PLT 193.0 03/05/2019   last hb was 11.2 A little sluggish  When she took iron -did not feel better  Past h/o non erosive gastropathy - no abd pain at all  HCV is 80.3   Takes B12 Lab Results  Component Value Date   VITAMINB12 >1515 (H) 03/05/2019  gets shots monthly - decreased to every other month and no more oral    Was on iron in the past   Hyperlipidemia Lab Results  Component Value Date   CHOL 161 03/05/2019   CHOL 127 11/06/2017   CHOL 117 05/02/2017   Lab Results  Component Value Date   HDL 46.90 03/05/2019   HDL 34.00 (L) 11/06/2017   HDL 35.40 (L) 05/02/2017   Lab Results  Component Value Date   LDLCALC 85 03/05/2019   LDLCALC 64 11/06/2017   LDLCALC 61 05/02/2017   Lab Results  Component Value Date   TRIG 145.0 03/05/2019   TRIG 144.0 11/06/2017   TRIG 102.0 05/02/2017   Lab Results  Component Value Date   CHOLHDL 3 03/05/2019   CHOLHDL 4 11/06/2017   CHOLHDL 3 05/02/2017   No results found for: LDLDIRECT crestor and diet  [place] and chicken Does eat bacon/eggs for breakfast and lots of steamed vegetables  Cardiology is watching this   Patient Active Problem List   Diagnosis Date Noted  . Coronary artery disease of native artery of native heart with stable angina pectoris ([place]) 04/01/2019  . Medicare annual wellness visit, subsequent 04/01/2019  . Syncope 12/25/2018  . Palpitations 12/25/2018  . PAD  (peripheral artery disease) ([place]) 12/25/2018  . Stenosis of left carotid artery 12/25/2018  . Abnormal urinalysis 10/15/2018  . Weight loss 01/13/2018  . PAF (paroxysmal atrial fibrillation) ([place]) 11/11/2017  . Routine general medical examination at a health care facility 11/11/2017  . Renal insufficiency 11/11/2017  . Osteopenia 04/30/2017  . Anemia 02/12/2017  . Vitamin D deficiency 02/03/2017  . Well woman  exam 11/05/2016  . Vitamin B12 deficiency 11/05/2016  . Hypertension 01/22/2012  . Coronary heart disease 01/22/2012  . S/P CABG x 3 01/22/2012  . Hyperlipidemia 01/22/2012  . Diabetes mellitus ([place]) 01/22/2012  . Renal artery stenosis ([place]) 01/22/2012   Past Medical History:  Diagnosis Date  . Anxiety   . Arthritis   . Atrial fibrillation ([place])    history  . Coronary artery disease   . Diabetes mellitus    non insulin dependent  . Dyspnea   . GERD (gastroesophageal reflux disease)   . Heart murmur   . Hyperlipidemia   . Hypertension   . Peripheral vascular disease ([place])   . UTI (urinary tract infection)    Past Surgical History:  Procedure Laterality Date  . APPENDECTOMY    . BREAST BIOPSY Left    20 years ago  . BREAST BIOPSY Right 2008  . BREAST EXCISIONAL BIOPSY     rt 2008 lt "20 years ago"  . CARDIAC CATHETERIZATION    . CATARACT EXTRACTION W/PHACO Left 08/15/2015   Procedure: CATARACT EXTRACTION PHACO AND INTRAOCULAR LENS PLACEMENT (IOC);  Surgeon: Estill Cotta, MD;  Location: [facility] ORS;  Service: Ophthalmology;  Laterality: Left;  [place] 01:28 AP% 26.4 CDE 35.73 fluid pack # [id number]  . COLONOSCOPY WITH PROPOFOL N/A 01/16/2016   Procedure: COLONOSCOPY WITH PROPOFOL;  Surgeon: Manya Silvas, MD;  Location: [facility] ENDOSCOPY;  Service: Endoscopy;  Laterality: N/A;  . CORONARY ANGIOPLASTY WITH STENT PLACEMENT    . CORONARY ARTERY BYPASS GRAFT     with LIMA  to the LAD, SVG to OM1, SVG to PDA  . ESOPHAGOGASTRODUODENOSCOPY (EGD) WITH PROPOFOL N/A 01/16/2016    Procedure: ESOPHAGOGASTRODUODENOSCOPY (EGD) WITH PROPOFOL;  Surgeon: Manya Silvas, MD;  Location[facility];  Service: Endoscopy;  Laterality: N/A;  . RENAL ARTERY STENT     left, By Dr Hulda Humphrey  . REPLACEMENT TOTAL KNEE  03/25/2013   right  . TONSILLECTOMY     Social History   Tobacco Use  . Smoking status: Never Smoker  . Smokeless tobacco: Never Used  Substance Use Topics  . Alcohol use: No  . Drug use: No   Family History  Problem Relation Age of Onset  . Heart disease Father   . Other Brother        open heart surgery  . Cancer Brother   . Other Brother        open heart surgery  . Other Brother        open heart surgery  . Hyperlipidemia Daughter   . Breast cancer Neg Hx   . Kidney cancer Neg Hx   . Bladder Cancer Neg Hx    Allergies  Allergen Reactions  . Ciprofloxacin Other (See Comments)    Unknown  . Codeine Nausea Only  . Erythromycin Nausea And Vomiting  . Tramadol Other (See Comments)    Unknown   Current Outpatient Medications on File Prior to Visit  Medication Sig Dispense Refill  . acetaminophen (TYLENOL) 500 MG tablet Take 1,000 mg by mouth every 6 (six) hours as needed for moderate pain or headache.    Marland Kitchen aspirin 81 MG tablet Take 81 mg by mouth daily.     . Biotin 10 MG CAPS Take 10 mg by mouth daily.    Marland Kitchen CALCIUM-MAGNESIUM-ZINC PO Take 1 tablet by mouth 2 (two) times daily.    . clopidogrel (PLAVIX) 75 MG tablet TAKE 1 TABLET BY MOUTH EVERY DAY 90 tablet 3  .  cyanocobalamin (,VITAMIN B-12,) 1000 MCG/ML injection INJECT 1ML INTRAMUSCULARLY ONCE MONTHLY AS DIRECTED (Patient taking differently: Inject 1,000 mcg into the muscle every 30 (thirty) days. ) 1 mL 11  . glipiZIDE (GLUCOTROL XL) 2.5 MG 24 hr tablet TAKE 1 TABLET (2.5 MG TOTAL) BY MOUTH DAILY WITH BREAKFAST. 90 tablet 1  . glucose blood (ONE TOUCH TEST STRIPS) test strip USE AS DIRECTED TO CHECK BLOOD SUGAR TWICE DAILY AND PRN E08.00 100 each 2  . lisinopril (PRINIVIL,ZESTRIL) 10 MG  tablet Take 1 tablet (10 mg total) by mouth daily. 90 tablet 3  . metFORMIN (GLUCOPHAGE-XR) 500 MG 24 hr tablet TAKE 1 TABLET BY MOUTH EVERY DAY WITH BREAKFAST 90 tablet 1  . OneTouch Delica Lancets 75I MISC USE AS INSTRUCTED TO TEST BLOOD SUGAR TWICE DAILY AS NEEDED (dx. E08.00) 100 each 1  . oral electrolyte (EQUALYTE) solution Take by mouth daily. 1 cap full    . pantoprazole (PROTONIX) 40 MG tablet Take 1 tablet (40 mg total) by mouth daily. 90 tablet 3  . Probiotic Product (PROBIOTIC PO) Take 1 capsule by mouth daily.    . rosuvastatin (CRESTOR) 40 MG tablet TAKE 1 TABLET BY MOUTH EVERY DAY 90 tablet 1  . carvedilol (COREG) 3.125 MG tablet Take 1 tablet (3.125 mg total) by mouth as directed. Take 1 tablet (3.125 mg) twice a day and may take extra tablet (3.125 mg) for palpitations 270 tablet 3  . nitroGLYCERIN (NITROSTAT) 0.4 MG SL tablet Place 1 tablet (0.4 mg total) under the tongue every 5 (five) minutes as needed for chest pain. 25 tablet 6   No current facility-administered medications on file prior to visit.     Review of Systems  Constitutional: Negative for activity change, appetite change, fatigue, fever and unexpected weight change.  HENT: Negative for congestion, ear pain, rhinorrhea, sinus pressure and sore throat.   Eyes: Negative for pain, redness and visual disturbance.  Respiratory: Negative for cough, shortness of breath and wheezing.   Cardiovascular: Negative for chest pain and palpitations.  Gastrointestinal: Negative for abdominal pain, blood in stool, constipation and diarrhea.  Endocrine: Negative for polydipsia and polyuria.  Genitourinary: Negative for dysuria, frequency and urgency.  Musculoskeletal: Negative for arthralgias, back pain and myalgias.  Skin: Negative for pallor and rash.  Allergic/Immunologic: Negative for environmental allergies.  Neurological: Positive for dizziness. Negative for tremors, syncope, facial asymmetry, speech difficulty, weakness,  light-headedness, numbness and headaches.  Hematological: Negative for adenopathy. Does not bruise/bleed easily.  Psychiatric/Behavioral: Negative for decreased concentration and dysphoric mood. The patient is not nervous/anxious.        Objective:   Physical Exam Constitutional:      General: She is not in acute distress.    Appearance: Normal appearance. She is well-developed and normal weight. She is not ill-appearing or diaphoretic.  HENT:     Head: Normocephalic and atraumatic.     Right Ear: Tympanic membrane, ear canal and external ear normal.     Left Ear: Tympanic membrane, ear canal and external ear normal.     Nose: No rhinorrhea.     Mouth/Throat:     Mouth: Mucous membranes are moist.     Pharynx: Oropharynx is clear. No posterior oropharyngeal erythema.  Eyes:     General: No scleral icterus.    Conjunctiva/sclera: Conjunctivae normal.     Pupils: Pupils are equal, round, and reactive to light.  Neck:     Musculoskeletal: Normal range of motion and neck supple. No neck rigidity or  muscular tenderness.     Thyroid: No thyromegaly.     Vascular: No carotid bruit or JVD.  Cardiovascular:     Rate and Rhythm: Normal rate and regular rhythm.     Pulses: Normal pulses.     Heart sounds: Murmur present. No gallop.   Pulmonary:     Effort: Pulmonary effort is normal. No respiratory distress.     Breath sounds: Normal breath sounds. No wheezing.  Chest:     Chest wall: No tenderness.  Abdominal:     General: Bowel sounds are normal. There is no distension or abdominal bruit.     Palpations: Abdomen is soft. There is no mass.     Tenderness: There is no abdominal tenderness.     Hernia: No hernia is present.  Genitourinary:    Comments: Breast exam: No mass, nodules, thickening, tenderness, bulging, retraction, inflamation, nipple discharge or skin changes noted.  No axillary or clavicular LA.     Musculoskeletal: Normal range of motion.        General: No tenderness.      Right lower leg: No edema.     Left lower leg: No edema.  Lymphadenopathy:     Cervical: No cervical adenopathy.  Skin:    General: Skin is warm and dry.     Coloration: Skin is not pale.     Findings: No erythema or rash.  Neurological:     Mental Status: She is alert. Mental status is at baseline.     Cranial Nerves: No cranial nerve deficit.     Motor: No weakness or abnormal muscle tone.     Coordination: Coordination normal.     Gait: Gait normal.     Deep Tendon Reflexes: Reflexes are normal and symmetric. Reflexes normal.  Psychiatric:        Mood and Affect: Mood normal.        Cognition and Memory: Cognition and memory normal.           Assessment & Plan:   Problem List Items Addressed This Visit      Cardiovascular and Mediastinum   Hypertension    bp in fair control at this time  BP Readings from Last 1 Encounters:  04/01/19 130/62   No changes needed Most recent labs reviewed  Disc lifstyle change with low sodium diet and exercise        Renal artery stenosis (HCC)    Clinically stable Followed by vascular       PAF (paroxysmal atrial fibrillation) (HCC)    Pt has gone in and out -per hx/ not severe  Continues cardiology f/u and plavix      Syncope    Pt has had dizzy episodes w/o LOC Had cardiac monitor and continues cardiology f/u Feels fine today      PAD (peripheral artery disease) ([place])    Clinically stable Followed by vascular       Coronary artery disease of native artery of native heart with stable angina pectoris ([place])    Clinically stable  Followed by cardiology        Endocrine   Diabetes mellitus ([place])    Lab Results  Component Value Date   HGBA1C 8.3 (H) 03/06/2019   Unable to get controlled w/o causing hypoglycemia  In addition to this- metformin xr will not be available due to recall  Ref to endocrinology  Diet is good  On ace and statin  Will get eye exam when she can schedule it  Relevant Orders    Ambulatory referral to Endocrinology     Musculoskeletal and Integument   Osteopenia    dexa 5/18 No falls or fx Disc need for calcium/ vitamin D/ wt bearing exercise and bone density test every 2 y to monitor Disc safety/ fracture risk in detail    Will hold on on next dexa due to pandemic        Genitourinary   Renal insufficiency    Numbers are improved More anemic however        Other   Hyperlipidemia    Disc goals for lipids and reasons to control them Rev last labs with pt Rev low sat fat diet in detail Taking statin -crestor  LDL up a bit ,will watch diet       Vitamin B12 deficiency    B12 level is high Will stop oral supp Change shots to every other mo      Vitamin D deficiency    Level good in 62s Vitamin D level is therapeutic with current supplementation Disc importance of this to bone and overall health       Anemia    Hb down to 10.2 Unsure if iron def or related to DM or renal insuff/chronic dz Will plan a re check with ferritin No GI symptoms       Relevant Orders   CBC with Differential/Platelet   Ferritin   Routine general medical examination at a health care facility    Reviewed health habits including diet and exercise and skin cancer prevention Reviewed appropriate screening tests for age  Also reviewed health mt list, fam hx and immunization status , as well as social and family history   See HPI Labs reviewed  Noted no hearing in L ear-baseline  Mood is fair  Plans to schedule eye exam and mammogram asap Chronic conditions reviewed Recommend shingrix if covered  Pt has adv directive No cognitive concerns  Will put off dexa a year/ no falls or fx Chronic conditions rev  Ref to endo for DM2 difficult to control      Medicare annual wellness visit, subsequent - Primary    Reviewed health habits including diet and exercise and skin cancer prevention Reviewed appropriate screening tests for age  Also reviewed health mt list,  fam hx and immunization status , as well as social and family history   See HPI Labs reviewed  Noted no hearing in L ear-baseline  Mood is fair  Plans to schedule eye exam and mammogram asap Chronic conditions reviewed Recommend shingrix if covered  Pt has adv directive No cognitive concerns  Will put off dexa a year/ no falls or fx

## 2019-04-01 NOTE — Assessment & Plan Note (Signed)
Disc goals for lipids and reasons to control them Rev last labs with pt Rev low sat fat diet in detail Taking statin -crestor  LDL up a bit ,will watch diet

## 2019-04-01 NOTE — Assessment & Plan Note (Signed)
Reviewed health habits including diet and exercise and skin cancer prevention Reviewed appropriate screening tests for age  Also reviewed health mt list, fam hx and immunization status , as well as social and family history   See HPI Labs reviewed  Noted no hearing in L ear-baseline  Mood is fair  Plans to schedule eye exam and mammogram asap Chronic conditions reviewed Recommend shingrix if covered  Pt has adv directive No cognitive concerns  Will put off dexa a year/ no falls or fx

## 2019-05-01 DIAGNOSIS — M1712 Unilateral primary osteoarthritis, left knee: Secondary | ICD-10-CM | POA: Diagnosis not present

## 2019-05-01 DIAGNOSIS — M25562 Pain in left knee: Secondary | ICD-10-CM | POA: Diagnosis not present

## 2019-05-02 ENCOUNTER — Other Ambulatory Visit: Payer: Self-pay | Admitting: Family Medicine

## 2019-05-05 ENCOUNTER — Other Ambulatory Visit: Payer: Self-pay | Admitting: *Deleted

## 2019-05-05 MED ORDER — GLUCOSE BLOOD VI STRP: ORAL_STRIP | 2 refills | Status: DC

## 2019-05-07 ENCOUNTER — Other Ambulatory Visit: Payer: Self-pay

## 2019-05-07 ENCOUNTER — Encounter: Payer: Self-pay | Admitting: "Endocrinology

## 2019-05-07 ENCOUNTER — Ambulatory Visit (INDEPENDENT_AMBULATORY_CARE_PROVIDER_SITE_OTHER): Payer: Medicare HMO | Admitting: "Endocrinology

## 2019-05-07 VITALS — BP 142/69 | HR 56 | Ht 62.25 in | Wt 132.0 lb

## 2019-05-07 DIAGNOSIS — E1122 Type 2 diabetes mellitus with diabetic chronic kidney disease: Secondary | ICD-10-CM

## 2019-05-07 DIAGNOSIS — I10 Essential (primary) hypertension: Secondary | ICD-10-CM | POA: Diagnosis not present

## 2019-05-07 DIAGNOSIS — N182 Chronic kidney disease, stage 2 (mild): Secondary | ICD-10-CM | POA: Diagnosis not present

## 2019-05-07 DIAGNOSIS — E782 Mixed hyperlipidemia: Secondary | ICD-10-CM

## 2019-05-07 MED ORDER — METFORMIN HCL ER 500 MG PO TB24: 500.0000 mg | ORAL_TABLET | Freq: Two times a day (BID) | ORAL | 2 refills | Status: DC

## 2019-05-07 NOTE — Progress Notes (Signed)
Endocrinology Consult Note       05/07/2019, 4:54 PM   Subjective:    Patient ID: Tracey [name], female    DOB: 12/23/1935.  Tracey [name] is being seen in consultation for management of currently uncontrolled symptomatic diabetes requested by  Abner Greenspan, MD.   Past Medical History:  Diagnosis Date  . Anxiety   . Arthritis   . Atrial fibrillation ([place])    history  . Coronary artery disease   . Diabetes mellitus    non insulin dependent  . Dyspnea   . GERD (gastroesophageal reflux disease)   . Heart murmur   . Hyperlipidemia   . Hypertension   . Peripheral vascular disease ([place])   . UTI (urinary tract infection)     Past Surgical History:  Procedure Laterality Date  . APPENDECTOMY    . BREAST BIOPSY Left    20 years ago  . BREAST BIOPSY Right 2008  . BREAST EXCISIONAL BIOPSY     rt 2008 lt "20 years ago"  . CARDIAC CATHETERIZATION    . CATARACT EXTRACTION W/PHACO Left 08/15/2015   Procedure: CATARACT EXTRACTION PHACO AND INTRAOCULAR LENS PLACEMENT (IOC);  Surgeon: Estill Cotta, MD;  Location: [facility] ORS;  Service: Ophthalmology;  Laterality: Left;  [place] 01:28 AP% 26.4 CDE 35.73 fluid pack # [id number]  . COLONOSCOPY WITH PROPOFOL N/A 01/16/2016   Procedure: COLONOSCOPY WITH PROPOFOL;  Surgeon: Manya Silvas, MD;  Location: [facility] ENDOSCOPY;  Service: Endoscopy;  Laterality: N/A;  . CORONARY ANGIOPLASTY WITH STENT PLACEMENT    . CORONARY ARTERY BYPASS GRAFT     with LIMA  to the LAD, SVG to OM1, SVG to PDA  . ESOPHAGOGASTRODUODENOSCOPY (EGD) WITH PROPOFOL N/A 01/16/2016   Procedure: ESOPHAGOGASTRODUODENOSCOPY (EGD) WITH PROPOFOL;  Surgeon: Manya Silvas, MD;  Location: [facility] ENDOSCOPY;  Service: Endoscopy;  Laterality: N/A;  . RENAL ARTERY STENT     left, By Dr Hulda Humphrey  . REPLACEMENT TOTAL KNEE  03/25/2013   right  . TONSILLECTOMY      Social History   Socioeconomic History  .  Marital status: Widowed    Spouse name: Not on file  . Number of children: Not on file  . Years of education: Not on file  . Highest education level: Not on file  Occupational History  . Not on file  Social Needs  . Financial resource strain: Not on file  . Food insecurity    Worry: Not on file    Inability: Not on file  . Transportation needs    Medical: Not on file    Non-medical: Not on file  Tobacco Use  . Smoking status: Never Smoker  . Smokeless tobacco: Never Used  Substance and Sexual Activity  . Alcohol use: No  . Drug use: No  . Sexual activity: Never  Lifestyle  . Physical activity    Days per week: Not on file    Minutes per session: Not on file  . Stress: Not on file  Relationships  . Social Herbalist on phone: Not on file    Gets together: Not on file  Attends religious service: Not on file    Active member of club or organization: Not on file    Attends meetings of clubs or organizations: Not on file    Relationship status: Not on file  Other Topics Concern  . Not on file  Social History Narrative  . Not on file    Family History  Problem Relation Age of Onset  . Heart disease Father   . Other Brother        open heart surgery  . Cancer Brother   . Other Brother        open heart surgery  . Other Brother        open heart surgery  . Hyperlipidemia Daughter   . Breast cancer Neg Hx   . Kidney cancer Neg Hx   . Bladder Cancer Neg Hx     Outpatient Encounter Medications as of 05/07/2019  Medication Sig  . acetaminophen (TYLENOL) 500 MG tablet Take 1,000 mg by mouth every 6 (six) hours as needed for moderate pain or headache.  Marland Kitchen aspirin 81 MG tablet Take 81 mg by mouth daily.   . carvedilol (COREG) 3.125 MG tablet Take 1 tablet (3.125 mg total) by mouth as directed. Take 1 tablet (3.125 mg) twice a day and may take extra tablet (3.125 mg) for palpitations  . clopidogrel (PLAVIX) 75 MG tablet TAKE 1 TABLET BY MOUTH EVERY DAY  .  glipiZIDE (GLUCOTROL XL) 2.5 MG 24 hr tablet TAKE 1 TABLET (2.5 MG TOTAL) BY MOUTH DAILY WITH BREAKFAST.  Marland Kitchen glucose blood (ONE TOUCH TEST STRIPS) test strip USE AS DIRECTED TO CHECK BLOOD SUGAR TWICE DAILY AND PRN E08.00  . lisinopril (PRINIVIL,ZESTRIL) 10 MG tablet Take 1 tablet (10 mg total) by mouth daily.  . metFORMIN (GLUCOPHAGE-XR) 500 MG 24 hr tablet Take 1 tablet (500 mg total) by mouth 2 (two) times daily after a meal.  . nitroGLYCERIN (NITROSTAT) 0.4 MG SL tablet Place 1 tablet (0.4 mg total) under the tongue every 5 (five) minutes as needed for chest pain.  [name] Lancets 37T MISC USE AS INSTRUCTED TO TEST BLOOD SUGAR TWICE DAILY AS NEEDED (dx. E08.00)  . oral electrolyte (EQUALYTE) solution Take by mouth daily. 1 cap full  . Probiotic Product (PROBIOTIC PO) Take 1 capsule by mouth daily.  . rosuvastatin (CRESTOR) 40 MG tablet TAKE 1 TABLET BY MOUTH EVERY DAY  . [DISCONTINUED] Biotin 10 MG CAPS Take 10 mg by mouth daily.  . [DISCONTINUED] CALCIUM-MAGNESIUM-ZINC PO Take 1 tablet by mouth 2 (two) times daily.  . [DISCONTINUED] cyanocobalamin (,VITAMIN B-12,) 1000 MCG/ML injection INJECT 1ML INTRAMUSCULARLY ONCE MONTHLY AS DIRECTED (Patient taking differently: Inject 1,000 mcg into the muscle. Every 2 months)  . [DISCONTINUED] metFORMIN (GLUCOPHAGE-XR) 500 MG 24 hr tablet TAKE 1 TABLET BY MOUTH EVERY DAY WITH BREAKFAST  . [DISCONTINUED] pantoprazole (PROTONIX) 40 MG tablet Take 1 tablet (40 mg total) by mouth daily.   No facility-administered encounter medications on file as of 05/07/2019.     ALLERGIES: Allergies  Allergen Reactions  . Ciprofloxacin Other (See Comments)    Unknown  . Codeine Nausea Only  . Erythromycin Nausea And Vomiting  . Tramadol Other (See Comments)    Unknown    VACCINATION STATUS: Immunization History  Administered Date(s) Administered  . Influenza Split 08/10/2015  . Influenza,inj,Quad PF,6+ Mos 05/24/2016, 07/26/2017, 06/03/2018  .  Influenza-Unspecified 08/10/2015, 05/24/2016  . Pneumococcal Conjugate-13 11/05/2016  . Pneumococcal Polysaccharide-23 12/11/2017  . Td 03/17/2013  Diabetes She presents for her initial diabetic visit. She has type 2 diabetes mellitus. Onset time: She was diagnosed at approximate age of 83 years. Her disease course has been worsening. There are no hypoglycemic associated symptoms. Pertinent negatives for hypoglycemia include no confusion, headaches, pallor or seizures. Associated symptoms include fatigue, polydipsia and polyuria. Pertinent negatives for diabetes include no chest pain and no polyphagia. There are no hypoglycemic complications. Symptoms are worsening. Diabetic complications include nephropathy. Risk factors for coronary artery disease include dyslipidemia, diabetes mellitus, hypertension and sedentary lifestyle. Her weight is fluctuating minimally. She is following a generally unhealthy diet. When asked about meal planning, she reported none. She rarely participates in exercise. (She did not bring any logs nor meter today to review.  Her most recent A1c was 8.3% on March 06, 2019.) An ACE inhibitor/angiotensin II receptor blocker is being taken.  Hyperlipidemia This is a chronic problem. The current episode started more than 1 year ago. Exacerbating diseases include diabetes. Pertinent negatives include no chest pain, myalgias or shortness of breath. Risk factors for coronary artery disease include dyslipidemia, diabetes mellitus, hypertension and post-menopausal.  Hypertension This is a chronic problem. The current episode started more than 1 year ago. Pertinent negatives include no chest pain, headaches, palpitations or shortness of breath. Risk factors for coronary artery disease include diabetes mellitus, dyslipidemia, sedentary lifestyle and post-menopausal state. Past treatments include beta blockers and ACE inhibitors. Hypertensive end-organ damage includes kidney disease.      Review of Systems  Constitutional: Positive for fatigue. Negative for chills, fever and unexpected weight change.  HENT: Negative for trouble swallowing and voice change.   Eyes: Negative for visual disturbance.  Respiratory: Negative for cough, shortness of breath and wheezing.   Cardiovascular: Negative for chest pain, palpitations and leg swelling.  Gastrointestinal: Negative for diarrhea, nausea and vomiting.  Endocrine: Positive for polydipsia and polyuria. Negative for cold intolerance, heat intolerance and polyphagia.  Musculoskeletal: Negative for arthralgias and myalgias.  Skin: Negative for color change, pallor, rash and wound.  Neurological: Negative for seizures and headaches.  Psychiatric/Behavioral: Negative for confusion and suicidal ideas.    Objective:    BP (!) 142/69   Pulse (!) 56   Ht 5' 2.25" (1.581 m)   Wt 132 lb (59.9 kg)   BMI 23.95 kg/m   Wt Readings from Last 3 Encounters:  05/07/19 132 lb (59.9 kg)  04/01/19 130 lb 3 oz (59.1 kg)  11/13/18 129 lb (58.5 kg)     Physical Exam Constitutional:      Appearance: She is well-developed.  HENT:     Head: Normocephalic and atraumatic.  Neck:     Musculoskeletal: Normal range of motion and neck supple.     Thyroid: No thyromegaly.     Trachea: No tracheal deviation.  Cardiovascular:     Rate and Rhythm: Normal rate.  Pulmonary:     Effort: Pulmonary effort is normal.  Abdominal:     Tenderness: There is no abdominal tenderness. There is no guarding.  Musculoskeletal: Normal range of motion.  Skin:    General: Skin is warm and dry.     Coloration: Skin is not pale.     Findings: No erythema or rash.  Neurological:     Mental Status: She is alert and oriented to person, place, and time.     Cranial Nerves: No cranial nerve deficit.     Coordination: Coordination normal.     Deep Tendon Reflexes: Reflexes are normal and symmetric.  Psychiatric:        Judgment: Judgment normal.        CMP ( most recent) CMP     Component Value Date/Time   NA 141 03/05/2019 0916   NA 137 03/27/2013 0545   K 4.5 03/05/2019 0916   K 3.9 03/27/2013 0545   CL 106 03/05/2019 0916   CL 103 03/27/2013 0545   CO2 27 03/05/2019 0916   CO2 27 03/27/2013 0545   GLUCOSE 139 (H) 03/05/2019 0916   GLUCOSE 122 (H) 03/27/2013 0545   BUN 37 (H) 03/05/2019 0916   BUN 10 03/27/2013 0545   CREATININE 1.11 03/05/2019 0916   CREATININE 1.31 (H) 02/01/2017 1547   CALCIUM 10.6 (H) 03/05/2019 0916   CALCIUM 8.9 03/27/2013 0545   PROT 6.4 03/05/2019 0916   PROT 7.6 10/07/2011 1316   ALBUMIN 4.4 03/05/2019 0916   ALBUMIN 4.5 10/07/2011 1316   AST 16 03/05/2019 0916   AST 26 10/07/2011 1316   ALT 10 03/05/2019 0916   ALT 26 10/07/2011 1316   ALKPHOS 44 03/05/2019 0916   ALKPHOS 46 (L) 10/07/2011 1316   BILITOT 0.5 03/05/2019 0916   BILITOT 0.5 10/07/2011 1316   GFRNONAA 35 (L) 10/06/2018 1424   GFRNONAA >60 03/27/2013 0545   GFRAA 41 (L) 10/06/2018 1424   GFRAA >60 03/27/2013 0545     Diabetic Labs (most recent): Lab Results  Component Value Date   HGBA1C 8.3 (H) 03/06/2019   HGBA1C 8.1 (H) 10/06/2018   HGBA1C 7.9 (A) 06/03/2018     Lipid Panel ( most recent) Lipid Panel     Component Value Date/Time   CHOL 161 03/05/2019 0916   CHOL 141 10/08/2011 0457   TRIG 145.0 03/05/2019 0916   TRIG 179 10/08/2011 0457   HDL 46.90 03/05/2019 0916   HDL 37 (L) 10/08/2011 0457   CHOLHDL 3 03/05/2019 0916   VLDL 29.0 03/05/2019 0916   VLDL 36 10/08/2011 0457   LDLCALC 85 03/05/2019 0916   LDLCALC 68 10/08/2011 0457      Lab Results  Component Value Date   TSH 2.68 03/05/2019   TSH 2.96 11/06/2017   TSH 1.24 02/01/2017   TSH 1.86 02/06/2016           Assessment & Plan:   Type 2 diabetes complicated by stage 2 CKD  - ZIANNA DERCOLE has currently uncontrolled symptomatic type 2 DM since  83 years of age,  with most recent A1c of 8.3 %. Recent labs reviewed. - I had  a long discussion with her about the progressive nature of diabetes and the pathology behind its complications. -her diabetes is complicated by stage 2 CKD and she remains at a high risk for more acute and chronic complications which include CAD, CVA, CKD, retinopathy, and neuropathy. These are all discussed in detail with her.  - I have counseled her on diet management by adopting a carbohydrate restricted/protein rich diet. - she admits that there is a room for improvement in her food and drink choices. - Suggestion is made for her to avoid simple carbohydrates  from her diet including Cakes, Sweet Desserts, Ice Cream, Soda (diet and regular), Sweet Tea, Candies, Chips, Cookies, Store Bought Juices, Alcohol in Excess of  1-2 drinks a day, Artificial Sweeteners,  Coffee Creamer, and "Sugar-free" Products. This will help patient to have more stable blood glucose profile and potentially avoid unintended weight gain.  - I encouraged her to switch to  unprocessed or minimally processed complex  starch and increased protein intake (animal or plant source), fruits, and vegetables.  - she is advised to stick to a routine mealtimes to eat 3 meals  a day and avoid unnecessary snacks ( to snack only to correct hypoglycemia).    - I have approached her with the following individualized plan to manage  her diabetes and patient agrees:   -Given the fact that her A1c is near target, she would not be considered for insulin treatment for now.   -She can cautiously continue to use metformin with adequate hydration currently 500 mg ER p.o. twice daily, along with her glipizide 2.5 mg XL p.o. daily.    -She is willing to continue monitoring blood glucose twice a day-daily before breakfast and at bedtime.   - she is encouraged to call clinic for blood glucose levels less than 70 or above 200 mg /dl. -She wants to get clearance for elective left knee surgery, recommended A1c to below 8%.  - she is not a candidate  for SGLT2 inhibitors due to concurrent renal insufficiency.  - she is not a candidate for incretin therapy due to her body habitus.   -If her A1c during next visit is greater than 8%, will be considered for basal insulin.  - Patient specific target  A1c;  LDL, HDL, Triglycerides, and  Waist Circumference were discussed in detail.  2) Blood Pressure /Hypertension:  her blood pressure is  controlled to target.   she is advised to continue her current medications including lisinopril 10 mg p.o. daily with breakfast . 3) Lipids/Hyperlipidemia:   Review of her recent lipid panel showed  controlled  LDL at 85 .  she  is advised to continue    Crestor 40 mg daily at bedtime.  Side effects and precautions discussed with her.  4)  Weight/Diet:  Body mass index is 23.95 kg/m.  -Is not a candidate for weight loss.  Exercise, and detailed carbohydrates information provided  -  detailed on discharge instructions.  5) Chronic Care/Health Maintenance:  -she  is on ACEI/ARB and Statin medications and  is encouraged to initiate and continue to follow up with Ophthalmology, Dentist,  Podiatrist at least yearly or according to recommendations, and advised to   stay away from smoking. I have recommended yearly flu vaccine and pneumonia vaccine at least every 5 years; moderate intensity exercise for up to 150 minutes weekly; and  sleep for at least 7 hours a day.  - she is  advised to maintain close follow up with [facility], Wynelle Fanny, MD for primary care needs, as well as her other providers for optimal and coordinated care.  - Time spent with the patient: 45 minutes, of which >50% was spent in obtaining information about her symptoms, reviewing her previous labs/studies, evaluations, and treatments, counseling her about her currently uncontrolled type 2 diabetes, hyperlipidemia, hypertension, and developing plans for long term treatment based on the latest standards of care/guidelines.  Please refer to " Patient Self  Inventory" in the Media  tab for reviewed elements of pertinent patient history.  Tracey [name] participated in the discussions, expressed understanding, and voiced agreement with the above plans.  All questions were answered to her satisfaction. she is encouraged to contact clinic should she have any questions or concerns prior to her return visit.  Follow up plan: - Return in about 5 weeks (around 06/11/2019) for Bring Meter and Logs- A1c in Office.  Glade Lloyd, MD [facility] [phone]  [address] [place], [place] [address] Phone: [phone]  Fax: [phone]    05/07/2019, 4:54 PM  This note was partially dictated with voice recognition software. Similar sounding words can be transcribed inadequately or may not  be corrected upon review.

## 2019-05-07 NOTE — Patient Instructions (Signed)

## 2019-05-13 ENCOUNTER — Telehealth: Payer: Self-pay | Admitting: "Endocrinology

## 2019-05-13 NOTE — Telephone Encounter (Signed)
Patient left Vm she wants to speak with you.

## 2019-05-13 NOTE — Telephone Encounter (Signed)
Right, Diarrhea is not a feature of pantoprazole withdrawal, but the upset stomach may be. She can restart the pantoprazole and observe. If she continues to have those issues, needs to call PMD.

## 2019-05-13 NOTE — Telephone Encounter (Signed)
Pt called concerned with having an upset stomach and diarrhea since Dr Dorris Fetch told her she did not need to be taking the Pantoprazole. I advised pt that I would let him know but also told her that it may be a situation that she needed to call her PCP. She states Dr Dorris Fetch had noticed that she was on this medication for 5 yrs and needed to stop.

## 2019-05-14 NOTE — Telephone Encounter (Signed)
Pt.notified

## 2019-05-18 ENCOUNTER — Telehealth (INDEPENDENT_AMBULATORY_CARE_PROVIDER_SITE_OTHER): Payer: Self-pay | Admitting: Vascular Surgery

## 2019-05-18 NOTE — Telephone Encounter (Signed)
Can you please contact patient to schedule as advised below per [facility].  AS, CMA

## 2019-05-18 NOTE — Telephone Encounter (Signed)
Let's get her in for ABIs and renal artery duplex.  We can discuss veins and possible studies for that at visit.  Me or Dew per patient preference

## 2019-05-18 NOTE — Telephone Encounter (Signed)
Please advise on the below. AS, CMA

## 2019-05-26 ENCOUNTER — Other Ambulatory Visit (INDEPENDENT_AMBULATORY_CARE_PROVIDER_SITE_OTHER): Payer: Self-pay | Admitting: Nurse Practitioner

## 2019-05-26 DIAGNOSIS — Z9889 Other specified postprocedural states: Secondary | ICD-10-CM

## 2019-05-26 DIAGNOSIS — I701 Atherosclerosis of renal artery: Secondary | ICD-10-CM

## 2019-05-26 DIAGNOSIS — R109 Unspecified abdominal pain: Secondary | ICD-10-CM

## 2019-05-26 DIAGNOSIS — M79604 Pain in right leg: Secondary | ICD-10-CM

## 2019-05-27 ENCOUNTER — Other Ambulatory Visit: Payer: Self-pay

## 2019-05-27 ENCOUNTER — Ambulatory Visit (INDEPENDENT_AMBULATORY_CARE_PROVIDER_SITE_OTHER): Payer: Medicare HMO

## 2019-05-27 ENCOUNTER — Ambulatory Visit (INDEPENDENT_AMBULATORY_CARE_PROVIDER_SITE_OTHER): Payer: Medicare HMO | Admitting: Nurse Practitioner

## 2019-05-27 ENCOUNTER — Encounter (INDEPENDENT_AMBULATORY_CARE_PROVIDER_SITE_OTHER): Payer: Self-pay | Admitting: Nurse Practitioner

## 2019-05-27 VITALS — BP 129/64 | HR 67 | Resp 14 | Ht 62.5 in | Wt 127.0 lb

## 2019-05-27 DIAGNOSIS — M79605 Pain in left leg: Secondary | ICD-10-CM

## 2019-05-27 DIAGNOSIS — Z9889 Other specified postprocedural states: Secondary | ICD-10-CM

## 2019-05-27 DIAGNOSIS — R109 Unspecified abdominal pain: Secondary | ICD-10-CM

## 2019-05-27 DIAGNOSIS — I701 Atherosclerosis of renal artery: Secondary | ICD-10-CM

## 2019-05-27 DIAGNOSIS — I8312 Varicose veins of left lower extremity with inflammation: Secondary | ICD-10-CM | POA: Diagnosis not present

## 2019-05-27 DIAGNOSIS — M79604 Pain in right leg: Secondary | ICD-10-CM

## 2019-05-27 DIAGNOSIS — E119 Type 2 diabetes mellitus without complications: Secondary | ICD-10-CM | POA: Diagnosis not present

## 2019-05-27 DIAGNOSIS — I8311 Varicose veins of right lower extremity with inflammation: Secondary | ICD-10-CM

## 2019-05-27 DIAGNOSIS — I700 Atherosclerosis of aorta: Secondary | ICD-10-CM | POA: Diagnosis not present

## 2019-05-27 DIAGNOSIS — I10 Essential (primary) hypertension: Secondary | ICD-10-CM

## 2019-05-27 NOTE — Progress Notes (Signed)
SUBJECTIVE:  Patient ID: Tracey [name], female    DOB: 05/06/1936, 83 y.o.   MRN: [id number] Chief Complaint  Patient presents with  . Follow-up    HPI  EDILIA [name] is a 83 y.o. female that presents today for follow-up with her renal artery stenosis.  The patient previously had stents placed on 06/26/2005.  She was encouraged to follow-up by her primary care physician.  The patient has been having some left flank pain recently.  The patient also has been having issues with her legs.  She states that currently she has issues with ambulating where she finds that she has some cramping and needs to rest for a time.  However, she states that the pain is worse at night when she is laying down.  She states that she is not able to get any relief until she is able to get up out of bed and she sleeps in her recliner with her legs angled down.  The patient also has concerns for varicose veins with the right leg bothering her more than the left.  She states that at the end of the evening the veins begin to bulge and they get sore and somewhat inflamed.  The patient does endorse wearing compression stockings whenever she is out and about.  The patient is also had a previous history of great saphenous vein harvesting for her CABG as well in her right lower extremity.  The patient denies any fever, chills, nausea, vomiting or diarrhea.    The patient underwent a renal artery duplex today which reveals a patent left renal stent.  The patient has normal size bilateral kidneys.  The resistive index is normal bilaterally.  The cortical thickness is normal bilaterally.  There is estimated at 1 to 59% stenosis bilaterally, however the velocities are no greater than 120 bilaterally.  This is consistent with previous exam done on 12/17/2017 although velocities are slightly elevated but not enough for intervention.  It was also noted that her abdominal aorta show moderate to severe calcified and irregular plaque within  intraluminal narrowing to 0.38 cm in the proximal to mid aorta with a velocity of 415 which suggest a greater than 75% stenosis.  Previous ultrasound measurements on 12/17/2017 had velocities suggesting a greater than 50% stenosis.  Velocities at that time were 294 at the mid aorta.  Patient also underwent bilateral ABIs with an ABI 0.94 on the left with a TBI 0.51.  The left lower extremity has an ABI 0.89 with a TBI 0.66.  The patient has triphasic waveforms within the bilateral tibial arteries.  Past Medical History:  Diagnosis Date  . Anxiety   . Arthritis   . Atrial fibrillation ([place])    history  . Coronary artery disease   . Diabetes mellitus    non insulin dependent  . Dyspnea   . GERD (gastroesophageal reflux disease)   . Heart murmur   . Hyperlipidemia   . Hypertension   . Peripheral vascular disease ([place])   . UTI (urinary tract infection)     Past Surgical History:  Procedure Laterality Date  . APPENDECTOMY    . BREAST BIOPSY Left    20 years ago  . BREAST BIOPSY Right 2008  . BREAST EXCISIONAL BIOPSY     rt 2008 lt "20 years ago"  . CARDIAC CATHETERIZATION    . CATARACT EXTRACTION W/PHACO Left 08/15/2015   Procedure: CATARACT EXTRACTION PHACO AND INTRAOCULAR LENS PLACEMENT (IOC);  Surgeon: Estill Cotta, MD;  Location: [facility] ORS;  Service: Ophthalmology;  Laterality: Left;  [place] 01:28 AP% 26.4 CDE 35.73 fluid pack # [id number]  . COLONOSCOPY WITH PROPOFOL N/A 01/16/2016   Procedure: COLONOSCOPY WITH PROPOFOL;  Surgeon: Manya Silvas, MD;  Location: [facility] ENDOSCOPY;  Service: Endoscopy;  Laterality: N/A;  . CORONARY ANGIOPLASTY WITH STENT PLACEMENT    . CORONARY ARTERY BYPASS GRAFT     with LIMA  to the LAD, SVG to OM1, SVG to PDA  . ESOPHAGOGASTRODUODENOSCOPY (EGD) WITH PROPOFOL N/A 01/16/2016   Procedure: ESOPHAGOGASTRODUODENOSCOPY (EGD) WITH PROPOFOL;  Surgeon: Manya Silvas, MD;  Location: [facility] ENDOSCOPY;  Service: Endoscopy;  Laterality: N/A;  . RENAL ARTERY  STENT     left, By Dr Hulda Humphrey  . REPLACEMENT TOTAL KNEE  03/25/2013   right  . TONSILLECTOMY      Social History   Socioeconomic History  . Marital status: Widowed    Spouse name: Not on file  . Number of children: Not on file  . Years of education: Not on file  . Highest education level: Not on file  Occupational History  . Not on file  Social Needs  . Financial resource strain: Not on file  . Food insecurity    Worry: Not on file    Inability: Not on file  . Transportation needs    Medical: Not on file    Non-medical: Not on file  Tobacco Use  . Smoking status: Never Smoker  . Smokeless tobacco: Never Used  Substance and Sexual Activity  . Alcohol use: No  . Drug use: No  . Sexual activity: Never  Lifestyle  . Physical activity    Days per week: Not on file    Minutes per session: Not on file  . Stress: Not on file  Relationships  . Social Herbalist on phone: Not on file    Gets together: Not on file    Attends religious service: Not on file    Active member of club or organization: Not on file    Attends meetings of clubs or organizations: Not on file    Relationship status: Not on file  . Intimate partner violence    Fear of current or ex partner: Not on file    Emotionally abused: Not on file    Physically abused: Not on file    Forced sexual activity: Not on file  Other Topics Concern  . Not on file  Social History Narrative  . Not on file    Family History  Problem Relation Age of Onset  . Heart disease Father   . Other Brother        open heart surgery  . Cancer Brother   . Other Brother        open heart surgery  . Other Brother        open heart surgery  . Hyperlipidemia Daughter   . Breast cancer Neg Hx   . Kidney cancer Neg Hx   . Bladder Cancer Neg Hx     Allergies  Allergen Reactions  . Ciprofloxacin Other (See Comments)    Unknown  . Codeine Nausea Only  . Erythromycin Nausea And Vomiting  . Tramadol Other (See  Comments)    Unknown     Review of Systems   Review of Systems: Negative Unless Checked Constitutional: [] Weight loss  [] Fever  [] Chills Cardiac: [] Chest pain   []  Atrial Fibrillation  [] Palpitations   [] Shortness of breath when laying flat   []   Shortness of breath with exertion. [] Shortness of breath at rest Vascular:  [x] Pain in legs with walking   [] Pain in legs with standing [x] Pain in legs when laying flat   [x] Claudication    [x] Pain in feet when laying flat    [] History of DVT   [] Phlebitis   [] Swelling in legs   [x] Varicose veins   [] Non-healing ulcers Pulmonary:   [] Uses home oxygen   [] Productive cough   [] Hemoptysis   [] Wheeze  [] COPD   [] Asthma Neurologic:  [] Dizziness   [] Seizures  [] Blackouts [] History of stroke   [] History of TIA  [] Aphasia   [] Temporary Blindness   [] Weakness or numbness in arm   [] Weakness or numbness in leg Musculoskeletal:   [] Joint swelling   [] Joint pain   [] Low back pain  []  History of Knee Replacement [x] Arthritis [] back Surgeries  []  Spinal Stenosis    Hematologic:  [] Easy bruising  [] Easy bleeding   [] Hypercoagulable state   [] Anemic Gastrointestinal:  [] Diarrhea   [] Vomiting  [x] Gastroesophageal reflux/heartburn   [] Difficulty swallowing. [] Abdominal pain Genitourinary:  [] Chronic kidney disease   [] Difficult urination  [] Anuric   [] Blood in urine [] Frequent urination  [] Burning with urination   [] Hematuria Skin:  [] Rashes   [] Ulcers [] Wounds Psychological:  [] History of anxiety   []  History of major depression  []  Memory Difficulties      OBJECTIVE:   Physical Exam  BP 129/64 (BP Location: Left Arm, Patient Position: Sitting, Cuff Size: Small)   Pulse 67   Resp 14   Ht 5' 2.5" (1.588 m)   Wt 127 lb (57.6 kg)   BMI 22.86 kg/m   Gen: WD/WN, NAD Head: [place]/AT, No temporalis wasting.  Ear/Nose/Throat: Hearing grossly intact, nares w/o erythema or drainage Eyes: PER, EOMI, sclera nonicteric.  Neck: Supple, no masses.  No JVD.  Pulmonary:  Good  air movement, no use of accessory muscles.  Cardiac: RRR Vascular:  scattered varicosities present bilaterally.  Mild venous stasis changes to the legs bilaterally.  2+ soft pitting edema  Vessel Right Left  Radial Palpable Palpable  Dorsalis Pedis Palpable Palpable  Posterior Tibial Palpable Palpable   Gastrointestinal: soft, non-distended. No guarding/no peritoneal signs.  Musculoskeletal: M/S 5/5 throughout.  No deformity or atrophy.  Neurologic: Pain and light touch intact in extremities.  Symmetrical.  Speech is fluent. Motor exam as listed above. Psychiatric: Judgment intact, Mood & affect appropriate for pt's clinical situation. Dermatologic: No Venous rashes. No Ulcers Noted.  No changes consistent with cellulitis. Lymph : No Cervical lymphadenopathy, no lichenification or skin changes of chronic lymphedema.       ASSESSMENT AND PLAN:  1. Aortic atherosclerosis (HCC) Based upon the patient's symptoms, they could be developing severe aortic atherosclerosis.  In this instance a CT scan is indicated in order to assess the degree of stenosis as well as to possibly plan for any future procedures.  I discussed the natural progression of atherosclerosis as well as what possible interventions may be necessary depending upon the results of the CT scan.  Once the patient has obtained her CT scan we will bring her back to the office to discuss results as well as further steps. - CT Angio Abd/Pel w/ and/or w/o; Future  2. Atherosclerosis of renal artery (HCC) Recommend:  The patient has evidence of atherosclerotic changes of the renal artery.  At this time the patient's blood pressure is fairly well controlled.  Patient does not need angiography of the renal artery given the good control of his hypertension.  However, if at any point the patient's BP becomes acutely worse then intervention would be strongly encouraged.  The patient voices understanding of this plan and agrees.      3. Essential hypertension Continue antihypertensive medications as already ordered, these medications have been reviewed and there are no changes at this time.   4. Type 2 diabetes mellitus without complication, without long-term current use of insulin (HCC) Continue hypoglycemic medications as already ordered, these medications have been reviewed and there are no changes at this time.  Hgb A1C to be monitored as already arranged by primary service   5. Varicose veins of both lower extremities with inflammation At the patient's convenience we will bring her back and in order to obtain a bilateral lower extremity venous reflux study.  In the meantime I have advised the patient to continue with conservative treatment methods such as wearing medical grade 1 compression stockings, exercise as much as tolerated, and elevation of her lower extremities.  We will follow-up with her varicose veins once we have completed work-up for aortic atherosclerosis since this is a much more pressing matter.   Current Outpatient Medications on File Prior to Visit  Medication Sig Dispense Refill  . acetaminophen (TYLENOL) 500 MG tablet Take 1,000 mg by mouth every 6 (six) hours as needed for moderate pain or headache.    Marland Kitchen aspirin 81 MG tablet Take 81 mg by mouth daily.     . carvedilol (COREG) 3.125 MG tablet Take 1 tablet (3.125 mg total) by mouth as directed. Take 1 tablet (3.125 mg) twice a day and may take extra tablet (3.125 mg) for palpitations 270 tablet 3  . clopidogrel (PLAVIX) 75 MG tablet TAKE 1 TABLET BY MOUTH EVERY DAY 90 tablet 3  . glipiZIDE (GLUCOTROL XL) 2.5 MG 24 hr tablet TAKE 1 TABLET (2.5 MG TOTAL) BY MOUTH DAILY WITH BREAKFAST. 90 tablet 1  . glucose blood (ONE TOUCH TEST STRIPS) test strip USE AS DIRECTED TO CHECK BLOOD SUGAR TWICE DAILY AND PRN E08.00 100 each 2  . lisinopril (PRINIVIL,ZESTRIL) 10 MG tablet Take 1 tablet (10 mg total) by mouth daily. 90 tablet 3  . metFORMIN  (GLUCOPHAGE-XR) 500 MG 24 hr tablet Take 1 tablet (500 mg total) by mouth 2 (two) times daily after a meal. 60 tablet 2  . nitroGLYCERIN (NITROSTAT) 0.4 MG SL tablet Place 1 tablet (0.4 mg total) under the tongue every 5 (five) minutes as needed for chest pain. 25 tablet 6  . OneTouch Delica Lancets 99991111 MISC USE AS INSTRUCTED TO TEST BLOOD SUGAR TWICE DAILY AS NEEDED (dx. E08.00) 100 each 1  . rosuvastatin (CRESTOR) 40 MG tablet TAKE 1 TABLET BY MOUTH EVERY DAY 90 tablet 1   No current facility-administered medications on file prior to visit.     There are no Patient Instructions on file for this visit. No follow-ups on file.   Kris Hartmann, NP  This note was completed with Sales executive.  Any errors are purely unintentional.

## 2019-05-29 ENCOUNTER — Ambulatory Visit: Payer: Medicare HMO | Admitting: Family Medicine

## 2019-05-29 ENCOUNTER — Telehealth (INDEPENDENT_AMBULATORY_CARE_PROVIDER_SITE_OTHER): Payer: Self-pay | Admitting: Nurse Practitioner

## 2019-05-29 NOTE — Telephone Encounter (Signed)
Called patient and advised patient to contact CT scheduling at [phone] to schedule CT of abdomen. Patient was made aware of the stipulation that the CT has to be done at [facility] so that insurance would pay. Patient verbalized understanding and was going to call to schedule CT. AS, CMA

## 2019-06-06 ENCOUNTER — Other Ambulatory Visit: Payer: Self-pay | Admitting: Cardiovascular Disease

## 2019-06-11 ENCOUNTER — Ambulatory Visit (HOSPITAL_COMMUNITY)
Admission: RE | Admit: 2019-06-11 | Discharge: 2019-06-11 | Disposition: A | Discharge status: Home / Self Care | Payer: Medicare HMO | Source: Ambulatory Visit | Attending: Nurse Practitioner | Admitting: Nurse Practitioner

## 2019-06-11 ENCOUNTER — Other Ambulatory Visit: Payer: Self-pay

## 2019-06-11 DIAGNOSIS — I700 Atherosclerosis of aorta: Secondary | ICD-10-CM | POA: Diagnosis present

## 2019-06-11 DIAGNOSIS — K5730 Diverticulosis of large intestine without perforation or abscess without bleeding: Secondary | ICD-10-CM | POA: Diagnosis not present

## 2019-06-11 LAB — POCT I-STAT CREATININE: Creatinine, Ser: 1.4 mg/dL — ABNORMAL HIGH (ref 0.44–1.00)

## 2019-06-11 MED ORDER — IOHEXOL 350 MG/ML SOLN
100.0000 mL | Freq: Once | INTRAVENOUS | Status: AC | PRN
  Administered 2019-06-11: 80 mL via INTRAVENOUS

## 2019-06-11 NOTE — Progress Notes (Signed)
Can we please have the patient come in to review CT results with [name]. Thanks

## 2019-06-12 ENCOUNTER — Encounter: Payer: Self-pay | Admitting: "Endocrinology

## 2019-06-12 ENCOUNTER — Ambulatory Visit (INDEPENDENT_AMBULATORY_CARE_PROVIDER_SITE_OTHER): Payer: Medicare HMO | Admitting: "Endocrinology

## 2019-06-12 VITALS — BP 118/70 | HR 70 | Temp 97.4°F | Ht 62.5 in | Wt 127.8 lb

## 2019-06-12 DIAGNOSIS — E1122 Type 2 diabetes mellitus with diabetic chronic kidney disease: Secondary | ICD-10-CM | POA: Diagnosis not present

## 2019-06-12 DIAGNOSIS — I10 Essential (primary) hypertension: Secondary | ICD-10-CM

## 2019-06-12 DIAGNOSIS — N182 Chronic kidney disease, stage 2 (mild): Secondary | ICD-10-CM | POA: Diagnosis not present

## 2019-06-12 DIAGNOSIS — E782 Mixed hyperlipidemia: Secondary | ICD-10-CM | POA: Diagnosis not present

## 2019-06-12 LAB — POCT GLYCOSYLATED HEMOGLOBIN (HGB A1C): Hemoglobin A1C: 6.5 % — AB (ref 4.0–5.6)

## 2019-06-12 NOTE — Progress Notes (Signed)
06/12/2019, 12:48 PM  Endocrinology follow-up note   Subjective:    Patient ID: Tracey [name], female    DOB: [birth year].  Tracey [name] is being seen  in follow-up after she was seen in consultation for management of currently uncontrolled symptomatic diabetes requested by  Abner Greenspan, MD.   Past Medical History:  Diagnosis Date  . Anxiety   . Arthritis   . Atrial fibrillation ([place])    history  . Coronary artery disease   . Diabetes mellitus    non insulin dependent  . Dyspnea   . GERD (gastroesophageal reflux disease)   . Heart murmur   . Hyperlipidemia   . Hypertension   . Peripheral vascular disease ([place])   . UTI (urinary tract infection)     Past Surgical History:  Procedure Laterality Date  . APPENDECTOMY    . BREAST BIOPSY Left    20 years ago  . BREAST BIOPSY Right 2008  . BREAST EXCISIONAL BIOPSY     rt 2008 lt "20 years ago"  . CARDIAC CATHETERIZATION    . CATARACT EXTRACTION W/PHACO Left 08/15/2015   Procedure: CATARACT EXTRACTION PHACO AND INTRAOCULAR LENS PLACEMENT (IOC);  Surgeon: Estill Cotta, MD;  Location: [facility] ORS;  Service: Ophthalmology;  Laterality: Left;  [place] 01:28 AP% 26.4 CDE 35.73 fluid pack # [id number]  . COLONOSCOPY WITH PROPOFOL N/A 01/16/2016   Procedure: COLONOSCOPY WITH PROPOFOL;  Surgeon: Manya Silvas, MD;  Location: [facility] ENDOSCOPY;  Service: Endoscopy;  Laterality: N/A;  . CORONARY ANGIOPLASTY WITH STENT PLACEMENT    . CORONARY ARTERY BYPASS GRAFT     with LIMA  to the LAD, SVG to OM1, SVG to PDA  . ESOPHAGOGASTRODUODENOSCOPY (EGD) WITH PROPOFOL N/A 01/16/2016   Procedure: ESOPHAGOGASTRODUODENOSCOPY (EGD) WITH PROPOFOL;  Surgeon: Manya Silvas, MD;  Location: [facility] ENDOSCOPY;  Service: Endoscopy;  Laterality: N/A;  . RENAL ARTERY STENT     left, By Dr Hulda Humphrey  . REPLACEMENT TOTAL KNEE  03/25/2013   right  . TONSILLECTOMY      Social  History   Socioeconomic History  . Marital status: Widowed    Spouse name: Not on file  . Number of children: Not on file  . Years of education: Not on file  . Highest education level: Not on file  Occupational History  . Not on file  Social Needs  . Financial resource strain: Not on file  . Food insecurity    Worry: Not on file    Inability: Not on file  . Transportation needs    Medical: Not on file    Non-medical: Not on file  Tobacco Use  . Smoking status: Never Smoker  . Smokeless tobacco: Never Used  Substance and Sexual Activity  . Alcohol use: No  . Drug use: No  . Sexual activity: Never  Lifestyle  . Physical activity    Days per week: Not on file    Minutes per session: Not on file  . Stress: Not on file  Relationships  . Social connections    Talks on phone: Not on file  Gets together: Not on file    Attends religious service: Not on file    Active member of club or organization: Not on file    Attends meetings of clubs or organizations: Not on file    Relationship status: Not on file  Other Topics Concern  . Not on file  Social History Narrative  . Not on file    Family History  Problem Relation Age of Onset  . Heart disease Father   . Other Brother        open heart surgery  . Cancer Brother   . Other Brother        open heart surgery  . Other Brother        open heart surgery  . Hyperlipidemia Daughter   . Breast cancer Neg Hx   . Kidney cancer Neg Hx   . Bladder Cancer Neg Hx     Outpatient Encounter Medications as of 06/12/2019  Medication Sig  . acetaminophen (TYLENOL) 500 MG tablet Take 1,000 mg by mouth every 6 (six) hours as needed for moderate pain or headache.  Marland Kitchen aspirin 81 MG tablet Take 81 mg by mouth daily.   . carvedilol (COREG) 3.125 MG tablet Take 1 tablet (3.125 mg total) by mouth as directed. Take 1 tablet (3.125 mg) twice a day and may take extra tablet (3.125 mg) for palpitations  . clopidogrel (PLAVIX) 75 MG tablet  TAKE 1 TABLET BY MOUTH EVERY DAY  . glipiZIDE (GLUCOTROL XL) 2.5 MG 24 hr tablet TAKE 1 TABLET (2.5 MG TOTAL) BY MOUTH DAILY WITH BREAKFAST.  Marland Kitchen glucose blood (ONE TOUCH TEST STRIPS) test strip USE AS DIRECTED TO CHECK BLOOD SUGAR TWICE DAILY AND PRN E08.00  . lisinopril (PRINIVIL,ZESTRIL) 10 MG tablet Take 1 tablet (10 mg total) by mouth daily.  . metFORMIN (GLUCOPHAGE-XR) 500 MG 24 hr tablet Take 1 tablet (500 mg total) by mouth 2 (two) times daily after a meal.  . nitroGLYCERIN (NITROSTAT) 0.4 MG SL tablet Place 1 tablet (0.4 mg total) under the tongue every 5 (five) minutes as needed for chest pain.  [name] Lancets 99991111 MISC USE AS INSTRUCTED TO TEST BLOOD SUGAR TWICE DAILY AS NEEDED (dx. E08.00)  . rosuvastatin (CRESTOR) 40 MG tablet Take 1 tablet (40 mg total) by mouth daily. PLEASE CALL [phone] TO SCHEDULE AN APPOINTMENT FOR FURTHER REFILLS. THANK YOU!   No facility-administered encounter medications on file as of 06/12/2019.     ALLERGIES: Allergies  Allergen Reactions  . Ciprofloxacin Other (See Comments)    Unknown  . Codeine Nausea Only  . Erythromycin Nausea And Vomiting  . Tramadol Other (See Comments)    Unknown    VACCINATION STATUS: Immunization History  Administered Date(s) Administered  . Influenza Split 08/10/2015  . Influenza,inj,Quad PF,6+ Mos 05/24/2016, 07/26/2017, 06/03/2018  . Influenza-Unspecified 08/10/2015, 05/24/2016  . Pneumococcal Conjugate-13 11/05/2016  . Pneumococcal Polysaccharide-23 12/11/2017  . Td 03/17/2013    Diabetes She presents for her follow-up diabetic visit. She has type 2 diabetes mellitus. Onset time: She was diagnosed at approximate age of 21 years. Her disease course has been improving. There are no hypoglycemic associated symptoms. Pertinent negatives for hypoglycemia include no confusion, headaches, pallor or seizures. Associated symptoms include fatigue. Pertinent negatives for diabetes include no chest pain, no  polydipsia, no polyphagia and no polyuria. There are no hypoglycemic complications. Symptoms are improving. Diabetic complications include nephropathy. Risk factors for coronary artery disease include dyslipidemia, diabetes mellitus, hypertension and sedentary lifestyle. Her  weight is decreasing rapidly (She lost 5 pounds since last visit.). She is following a generally unhealthy diet. When asked about meal planning, she reported none. She rarely participates in exercise. (She presents with controlled glycemic profile both fasting and postprandial.  Her point-of-care A1c today was 6.5%, improving from 8.3%. ) An ACE inhibitor/angiotensin II receptor blocker is being taken.  Hyperlipidemia This is a chronic problem. The current episode started more than 1 year ago. Exacerbating diseases include diabetes. Pertinent negatives include no chest pain, myalgias or shortness of breath. Risk factors for coronary artery disease include dyslipidemia, diabetes mellitus, hypertension and post-menopausal.  Hypertension This is a chronic problem. The current episode started more than 1 year ago. Pertinent negatives include no chest pain, headaches, palpitations or shortness of breath. Risk factors for coronary artery disease include diabetes mellitus, dyslipidemia, sedentary lifestyle and post-menopausal state. Past treatments include beta blockers and ACE inhibitors. Hypertensive end-organ damage includes kidney disease.    Constitutional: + weight loss, no fatigue, no subjective hyperthermia, no subjective hypothermia Eyes: no blurry vision, no xerophthalmia ENT: no sore throat, no nodules palpated in throat, no dysphagia/odynophagia, no hoarseness Cardiovascular: no Chest Pain, no Shortness of Breath, no palpitations, no leg swelling Respiratory: no cough, no SOB Gastrointestinal: no Nausea/Vomiting/Diarhhea Musculoskeletal: no muscle/joint aches Skin: no rashes Neurological: no tremors, no numbness, no  tingling, no dizziness Psychiatric: no depression, no anxiety   Objective:    BP 118/70 (BP Location: Right Arm, Patient Position: Sitting, Cuff Size: Normal)   Pulse 70   Temp (!) 97.4 F (36.3 C) (Oral)   Ht 5' 2.5" (1.588 m)   Wt 127 lb 12.8 oz (58 kg)   SpO2 98%   BMI 23.00 kg/m   Wt Readings from Last 3 Encounters:  06/12/19 127 lb 12.8 oz (58 kg)  05/27/19 127 lb (57.6 kg)  05/07/19 132 lb (59.9 kg)     Physical Exam Constitutional:      Appearance: She is well-developed.  HENT:     Head: Normocephalic and atraumatic.  Neck:     Musculoskeletal: Normal range of motion and neck supple.     Thyroid: No thyromegaly.     Trachea: No tracheal deviation.  Cardiovascular:     Rate and Rhythm: Normal rate.  Pulmonary:     Effort: Pulmonary effort is normal.  Abdominal:     Tenderness: There is no abdominal tenderness. There is no guarding.  Musculoskeletal: Normal range of motion.  Skin:    General: Skin is warm and dry.     Coloration: Skin is not pale.     Findings: No erythema or rash.  Neurological:     Mental Status: She is alert and oriented to person, place, and time.     Cranial Nerves: No cranial nerve deficit.     Coordination: Coordination normal.     Deep Tendon Reflexes: Reflexes are normal and symmetric.  Psychiatric:        Judgment: Judgment normal.      CMP ( most recent) CMP     Component Value Date/Time   NA 141 03/05/2019 0916   NA 137 03/27/2013 0545   K 4.5 03/05/2019 0916   K 3.9 03/27/2013 0545   CL 106 03/05/2019 0916   CL 103 03/27/2013 0545   CO2 27 03/05/2019 0916   CO2 27 03/27/2013 0545   GLUCOSE 139 (H) 03/05/2019 0916   GLUCOSE 122 (H) 03/27/2013 0545   BUN 37 (H) 03/05/2019 0916   BUN 10  03/27/2013 0545   CREATININE 1.40 (H) 06/11/2019 1506   CREATININE 1.31 (H) 02/01/2017 1547   CALCIUM 10.6 (H) 03/05/2019 0916   CALCIUM 8.9 03/27/2013 0545   PROT 6.4 03/05/2019 0916   PROT 7.6 10/07/2011 1316   ALBUMIN 4.4  03/05/2019 0916   ALBUMIN 4.5 10/07/2011 1316   AST 16 03/05/2019 0916   AST 26 10/07/2011 1316   ALT 10 03/05/2019 0916   ALT 26 10/07/2011 1316   ALKPHOS 44 03/05/2019 0916   ALKPHOS 46 (L) 10/07/2011 1316   BILITOT 0.5 03/05/2019 0916   BILITOT 0.5 10/07/2011 1316   GFRNONAA 35 (L) 10/06/2018 1424   GFRNONAA >60 03/27/2013 0545   GFRAA 41 (L) 10/06/2018 1424   GFRAA >60 03/27/2013 0545     Diabetic Labs (most recent): Lab Results  Component Value Date   HGBA1C 6.5 (A) 06/12/2019   HGBA1C 8.3 (H) 03/06/2019   HGBA1C 8.1 (H) 10/06/2018     Lipid Panel ( most recent) Lipid Panel     Component Value Date/Time   CHOL 161 03/05/2019 0916   CHOL 141 10/08/2011 0457   TRIG 145.0 03/05/2019 0916   TRIG 179 10/08/2011 0457   HDL 46.90 03/05/2019 0916   HDL 37 (L) 10/08/2011 0457   CHOLHDL 3 03/05/2019 0916   VLDL 29.0 03/05/2019 0916   VLDL 36 10/08/2011 0457   LDLCALC 85 03/05/2019 0916   LDLCALC 68 10/08/2011 0457      Lab Results  Component Value Date   TSH 2.68 03/05/2019   TSH 2.96 11/06/2017   TSH 1.24 02/01/2017   TSH 1.86 02/06/2016       Assessment & Plan:   Type 2 diabetes complicated by stage 2 CKD  - Tracey [name] has currently uncontrolled symptomatic type 2 DM since  83 years of age. -She presents with controlled glycemic profile, and her point-of-care A1c is 6.5% today improving from 8.3%.  - I had a long discussion with her about the progressive nature of diabetes and the pathology behind its complications. -her diabetes is complicated by stage 2 CKD and she remains at a high risk for more acute and chronic complications which include CAD, CVA, CKD, retinopathy, and neuropathy. These are all discussed in detail with her.  - I have counseled her on diet management by adopting a carbohydrate restricted/protein rich diet.  -  Suggestion is made for her to avoid simple carbohydrates  from her diet including Cakes, Sweet Desserts / Pastries, Ice  Cream, Soda (diet and regular), Sweet Tea, Candies, Chips, Cookies, Sweet Pastries,  Store Bought Juices, Alcohol in Excess of  1-2 drinks a day, Artificial Sweeteners, Coffee Creamer, and "Sugar-free" Products. This will help patient to have stable blood glucose profile and potentially avoid unintended weight gain.   - I encouraged her to switch to  unprocessed or minimally processed complex starch and increased protein intake (animal or plant source), fruits, and vegetables.  - she is advised to stick to a routine mealtimes to eat 3 meals  a day and avoid unnecessary snacks ( to snack only to correct hypoglycemia).    - I have approached her with the following individualized plan to manage  her diabetes and patient agrees:   -She has well-controlled type 2 diabetes with A1c of 6.5%. -She can cautiously continue to use metformin with adequate hydration currently 500 mg ER p.o. twice daily, along with her glipizide 2.5 mg XL p.o. daily.    -She is willing to continue monitoring  blood glucose twice a day-daily before breakfast and at bedtime.   - she is encouraged to call clinic for blood glucose levels less than 70 or above 200 mg /dl. -She is cleared for her elective left knee surgery from diabetes point of view.    - she is not a candidate for SGLT2 inhibitors due to concurrent renal insufficiency.  - she is not a candidate for incretin therapy due to her body habitus.   -If her A1c during next visit is greater than 8%, will be considered for basal insulin.  - Patient specific target  A1c;  LDL, HDL, Triglycerides, and  Waist Circumference were discussed in detail.  2) Blood Pressure /Hypertension:  her blood pressure is  controlled to target.   she is advised to continue her current medications including lisinopril 10 mg p.o. daily with breakfast .  3) Lipids/Hyperlipidemia:   Review of her recent lipid panel showed  controlled  LDL at 85 .  she  is advised to continue Crestor 40 mg p.o.  daily at bedtime. Side effects and precautions discussed with her.  4)  Weight/Diet:  Body mass index is 23 kg/m.  -She is not a candidate for weight loss.    Exercise, and detailed carbohydrates information provided  -  detailed on discharge instructions.  5) Chronic Care/Health Maintenance:  -she  is on ACEI/ARB and Statin medications and  is encouraged to initiate and continue to follow up with Ophthalmology, Dentist,  Podiatrist at least yearly or according to recommendations, and advised to   stay away from smoking. I have recommended yearly flu vaccine and pneumonia vaccine at least every 5 years; moderate intensity exercise for up to 150 minutes weekly; and  sleep for at least 7 hours a day.  - she is  advised to maintain close follow up with [facility], Wynelle Fanny, MD for primary care needs, as well as her other providers for optimal and coordinated care.  - Patient Care Time Today:  25 min, of which >50% was spent in  counseling and the rest reviewing her  current and  previous labs/studies, previous treatments, her blood glucose readings, and medications' doses and developing a plan for long-term care based on the latest recommendations for standards of care.   Julio Sicks participated in the discussions, expressed understanding, and voiced agreement with the above plans.  All questions were answered to her satisfaction. she is encouraged to contact clinic should she have any questions or concerns prior to her return visit..11   Follow up plan: - Return in about 4 months (around 10/12/2019) for Bring Meter and Logs- A1c in Office.  Glade Lloyd, MD [facility] [address] [place], [place] [address] Phone: [phone]  Fax: [phone]    06/12/2019, 12:48 PM  This note was partially dictated with voice recognition software. Similar sounding words can be transcribed inadequately or may not  be corrected upon review.

## 2019-06-15 ENCOUNTER — Other Ambulatory Visit: Payer: Self-pay | Admitting: *Deleted

## 2019-06-15 ENCOUNTER — Ambulatory Visit (INDEPENDENT_AMBULATORY_CARE_PROVIDER_SITE_OTHER): Payer: Medicare HMO

## 2019-06-15 DIAGNOSIS — Z23 Encounter for immunization: Secondary | ICD-10-CM

## 2019-06-15 MED ORDER — PANTOPRAZOLE SODIUM 40 MG PO TBEC: 40.0000 mg | DELAYED_RELEASE_TABLET | Freq: Every day | ORAL | 1 refills | Status: DC

## 2019-06-16 ENCOUNTER — Telehealth: Payer: Self-pay

## 2019-06-16 ENCOUNTER — Other Ambulatory Visit: Payer: Self-pay

## 2019-06-16 DIAGNOSIS — R6889 Other general symptoms and signs: Secondary | ICD-10-CM | POA: Diagnosis not present

## 2019-06-16 DIAGNOSIS — Z20822 Contact with and (suspected) exposure to covid-19: Secondary | ICD-10-CM

## 2019-06-16 DIAGNOSIS — R197 Diarrhea, unspecified: Secondary | ICD-10-CM | POA: Insufficient documentation

## 2019-06-16 NOTE — Telephone Encounter (Signed)
Order done  Will watch for results

## 2019-06-16 NOTE — Telephone Encounter (Signed)
Tracey [name] said that Tracey [name] is experiencing acute worsening of diarrhea,nausea,body aches and dry hacky cough. Tracey [name] is sending for covid testing this afternoon can you please put order in. Have reviewed quarantine procedure and Tracey [name] voiced understanding. No emergent symptoms as SOB or CP but if develop will go to ED.

## 2019-06-18 LAB — NOVEL CORONAVIRUS, NAA: SARS-CoV-2, NAA: NOT DETECTED

## 2019-06-24 ENCOUNTER — Other Ambulatory Visit: Payer: Self-pay | Admitting: "Endocrinology

## 2019-06-25 ENCOUNTER — Ambulatory Visit (INDEPENDENT_AMBULATORY_CARE_PROVIDER_SITE_OTHER): Payer: Medicare HMO | Admitting: Obstetrics and Gynecology

## 2019-06-25 ENCOUNTER — Other Ambulatory Visit: Payer: Self-pay

## 2019-06-25 ENCOUNTER — Encounter: Payer: Self-pay | Admitting: Obstetrics and Gynecology

## 2019-06-25 VITALS — BP 136/48 | HR 65 | Ht 62.5 in | Wt 127.4 lb

## 2019-06-25 DIAGNOSIS — R5383 Other fatigue: Secondary | ICD-10-CM

## 2019-06-25 DIAGNOSIS — K521 Toxic gastroenteritis and colitis: Secondary | ICD-10-CM | POA: Diagnosis not present

## 2019-06-25 DIAGNOSIS — Z1239 Encounter for other screening for malignant neoplasm of breast: Secondary | ICD-10-CM | POA: Diagnosis not present

## 2019-06-25 DIAGNOSIS — N3941 Urge incontinence: Secondary | ICD-10-CM

## 2019-06-25 LAB — POCT URINALYSIS DIPSTICK
Bilirubin, UA: NEGATIVE
Glucose, UA: NEGATIVE
Ketones, UA: NEGATIVE
Leukocytes, UA: NEGATIVE
Nitrite, UA: NEGATIVE
Protein, UA: POSITIVE — AB
Spec Grav, UA: 1.02 (ref 1.010–1.025)
Urobilinogen, UA: 0.2 E.U./dL
pH, UA: 5 (ref 5.0–8.0)

## 2019-06-25 NOTE — Progress Notes (Signed)
Subjective:   Tracey [name] is a 83 y.o. G78P4 Caucasian female here for a routine well-woman exam.  No LMP recorded. Patient is postmenopausal.    Current complaints: feels like bladder or uterus has prolapsed. Has been wearing a poise pad for the last 3 months due to urge incontinence.  Also having daily diarrhea for 2-3 months, worse since increasing metformin 500mg  XR to twice a day dosing. States feels very tired and legs are aching so bad when she lays down she has been sleeping in recliner.  PCP: [facility]       doesn't desire labs  Social History: Sexual: heterosexual Marital Status: widowed Living situation: alone Occupation: retired Tobacco/alcohol: no tobacco use Illicit drugs: no history of illicit drug use  The following portions of the patient's history were reviewed and updated as appropriate: allergies, current medications, past family history, past medical history, past social history, past surgical history and problem list.  Past Medical History Past Medical History:  Diagnosis Date  . Anxiety   . Arthritis   . Atrial fibrillation ([place])    history  . Coronary artery disease   . Diabetes mellitus    non insulin dependent  . Dyspnea   . GERD (gastroesophageal reflux disease)   . Heart murmur   . Hyperlipidemia   . Hypertension   . Peripheral vascular disease ([place])   . UTI (urinary tract infection)     Past Surgical History Past Surgical History:  Procedure Laterality Date  . APPENDECTOMY    . BREAST BIOPSY Left    20 years ago  . BREAST BIOPSY Right 2008  . BREAST EXCISIONAL BIOPSY     rt 2008 lt "20 years ago"  . CARDIAC CATHETERIZATION    . CATARACT EXTRACTION W/PHACO Left 08/15/2015   Procedure: CATARACT EXTRACTION PHACO AND INTRAOCULAR LENS PLACEMENT (IOC);  Surgeon: Estill Cotta, MD;  Location: [facility] ORS;  Service: Ophthalmology;  Laterality: Left;  [place] 01:28 AP% 26.4 CDE 35.73 fluid pack # [id number]  . COLONOSCOPY WITH PROPOFOL N/A 01/16/2016   Procedure: COLONOSCOPY WITH PROPOFOL;  Surgeon: Manya Silvas, MD;  Location: [facility] ENDOSCOPY;  Service: Endoscopy;  Laterality: N/A;  . CORONARY ANGIOPLASTY WITH STENT PLACEMENT    . CORONARY ARTERY BYPASS GRAFT     with LIMA  to the LAD, SVG to OM1, SVG to PDA  . ESOPHAGOGASTRODUODENOSCOPY (EGD) WITH PROPOFOL N/A 01/16/2016   Procedure: ESOPHAGOGASTRODUODENOSCOPY (EGD) WITH PROPOFOL;  Surgeon: Manya Silvas, MD;  Location: [facility] ENDOSCOPY;  Service: Endoscopy;  Laterality: N/A;  . RENAL ARTERY STENT     left, By Dr Hulda Humphrey  . REPLACEMENT TOTAL KNEE  03/25/2013   right  . TONSILLECTOMY      Gynecologic History G4P4  No LMP recorded. Patient is postmenopausal. Contraception: post menopausal status & abstinence  Last Pap: ?Marland Kitchen Results were: normal Last mammogram: 12/2017. Results were: normal   Obstetric History OB History  Gravida Para Term Preterm AB Living  4 4          SAB TAB Ectopic Multiple Live Births               # Outcome Date GA Lbr Len/2nd Weight Sex Delivery Anes PTL Lv  4 Para 42    M Vag-Spont     [place]  Current Medications Current Outpatient Medications on File Prior to Visit  Medication Sig Dispense Refill  . acetaminophen (TYLENOL) 500 MG tablet Take 1,000 mg by mouth every 6 (six) hours as needed for moderate pain or headache.    Marland Kitchen aspirin 81 MG tablet Take 81 mg by mouth daily.     . clopidogrel (PLAVIX) 75 MG tablet TAKE 1 TABLET BY MOUTH EVERY DAY 90 tablet 3  . glipiZIDE (GLUCOTROL XL) 2.5 MG 24 hr tablet TAKE 1 TABLET (2.5 MG TOTAL) BY MOUTH DAILY WITH BREAKFAST. 90 tablet 1  . glucose blood (ONE TOUCH TEST STRIPS) test strip USE AS DIRECTED TO CHECK BLOOD SUGAR TWICE DAILY AND PRN E08.00 100 each 2  . lisinopril (PRINIVIL,ZESTRIL) 10 MG tablet Take 1 tablet (10 mg total) by mouth daily. 90 tablet 3  . metFORMIN (GLUCOPHAGE-XR) 500 MG 24 hr tablet TAKE 1 TABLET  (500 MG TOTAL) BY MOUTH 2 (TWO) TIMES DAILY AFTER A MEAL. 180 tablet 0  . OneTouch Delica Lancets 99991111 MISC USE AS INSTRUCTED TO TEST BLOOD SUGAR TWICE DAILY AS NEEDED (dx. E08.00) 100 each 1  . pantoprazole (PROTONIX) 40 MG tablet Take 1 tablet (40 mg total) by mouth daily. 90 tablet 1  . rosuvastatin (CRESTOR) 40 MG tablet Take 1 tablet (40 mg total) by mouth daily. PLEASE CALL [phone] TO SCHEDULE AN APPOINTMENT FOR FURTHER REFILLS. THANK YOU! 90 tablet 0  . carvedilol (COREG) 3.125 MG tablet Take 1 tablet (3.125 mg total) by mouth as directed. Take 1 tablet (3.125 mg) twice a day and may take extra tablet (3.125 mg) for palpitations 270 tablet 3  . nitroGLYCERIN (NITROSTAT) 0.4 MG SL tablet Place 1 tablet (0.4 mg total) under the tongue every 5 (five) minutes as needed for chest pain. 25 tablet 6   No current facility-administered medications on file prior to visit.     Review of Systems Patient denies any headaches, blurred vision, shortness of breath, chest pain, abdominal pain. Does report legs aching at night and fatigue.  Objective:  BP (!) 136/48   Pulse 65   Ht 5' 2.5" (1.588 m)   Wt 127 lb 6 oz (57.8 kg)   BMI 22.93 kg/m  Physical Exam  General:  Well developed, well nourished, no acute distress. She is alert and oriented x3. Skin:  Warm and dry Neck:  Midline trachea, no thyromegaly or nodules Cardiovascular: Regular rate and rhythm, no murmur heard Lungs:  Effort normal, all lung fields clear to auscultation bilaterally Breasts:  No dominant palpable mass, retraction, or nipple discharge Abdomen:  Soft, non tender, no hepatosplenomegaly or masses Pelvic:  External genitalia is normal and atrophic in appearance.  The vagina is atrophic in appearance without prolapse. The cervix is bulbous, no CMT.  Thin prep pap is not done. Uterus is felt to be normal size, shape, and contour.  No adnexal masses or tenderness noted. Extremities:  No swelling or varicosities noted Psych:   She has a normal mood and affect  Assessment:   Healthy well-woman exam Urge incontinence  Diarrhea Fatigue   Plan:  Will test urine for UTI, reassured of other normal findings and no signs of bladder/uterine prolapse. Encouraged regular Kegel exercises. Recommended stopping pm metformin dose for 1 month to see if diarrhea resolves.  F/U 1 year for annual screening, or sooner if needed Mammogram ordered  Amere Bricco Rockney Ghee, CNM

## 2019-06-27 LAB — URINE CULTURE

## 2019-06-29 ENCOUNTER — Telehealth: Payer: Self-pay | Admitting: Obstetrics and Gynecology

## 2019-06-29 NOTE — Telephone Encounter (Signed)
Patient called stating she has not heard anything back in regards to urine culture done last week. She states sh is experiencing a lot on vaginal/pelvic pain and frequency of urination. Patient advised that Melody is not in the office today. She would like a call back from a nurse if possible. Thanks

## 2019-06-30 ENCOUNTER — Telehealth: Payer: Self-pay

## 2019-06-30 ENCOUNTER — Other Ambulatory Visit: Payer: Self-pay

## 2019-06-30 ENCOUNTER — Telehealth: Payer: Self-pay | Admitting: *Deleted

## 2019-06-30 ENCOUNTER — Ambulatory Visit (INDEPENDENT_AMBULATORY_CARE_PROVIDER_SITE_OTHER): Payer: Medicare HMO | Admitting: Vascular Surgery

## 2019-06-30 ENCOUNTER — Other Ambulatory Visit (INDEPENDENT_AMBULATORY_CARE_PROVIDER_SITE_OTHER): Payer: Medicare HMO

## 2019-06-30 VITALS — BP 126/66 | HR 66 | Resp 16 | Ht 62.0 in | Wt 127.0 lb

## 2019-06-30 DIAGNOSIS — E782 Mixed hyperlipidemia: Secondary | ICD-10-CM

## 2019-06-30 DIAGNOSIS — I701 Atherosclerosis of renal artery: Secondary | ICD-10-CM

## 2019-06-30 DIAGNOSIS — N182 Chronic kidney disease, stage 2 (mild): Secondary | ICD-10-CM | POA: Diagnosis not present

## 2019-06-30 DIAGNOSIS — R8290 Unspecified abnormal findings in urine: Secondary | ICD-10-CM | POA: Diagnosis not present

## 2019-06-30 DIAGNOSIS — I10 Essential (primary) hypertension: Secondary | ICD-10-CM | POA: Diagnosis not present

## 2019-06-30 DIAGNOSIS — I739 Peripheral vascular disease, unspecified: Secondary | ICD-10-CM

## 2019-06-30 DIAGNOSIS — E1122 Type 2 diabetes mellitus with diabetic chronic kidney disease: Secondary | ICD-10-CM | POA: Diagnosis not present

## 2019-06-30 DIAGNOSIS — R300 Dysuria: Secondary | ICD-10-CM | POA: Diagnosis not present

## 2019-06-30 LAB — POC URINALSYSI DIPSTICK (AUTOMATED)
Bilirubin, UA: NEGATIVE
Blood, UA: 25
Glucose, UA: NEGATIVE
Ketones, UA: NEGATIVE
Nitrite, UA: POSITIVE
Protein, UA: POSITIVE — AB
Spec Grav, UA: 1.02 (ref 1.010–1.025)
Urobilinogen, UA: 0.2 E.U./dL
pH, UA: 6 (ref 5.0–8.0)

## 2019-06-30 MED ORDER — CEPHALEXIN 500 MG PO CAPS: 500.0000 mg | ORAL_CAPSULE | Freq: Two times a day (BID) | ORAL | 0 refills | Status: DC

## 2019-06-30 NOTE — Telephone Encounter (Signed)
Since urine was negative, and I know she is having other pelvic issues, have her follow up with PCP

## 2019-06-30 NOTE — Assessment & Plan Note (Signed)
blood glucose control important in reducing the progression of atherosclerotic disease. Also, involved in wound healing. On appropriate medications.  

## 2019-06-30 NOTE — Patient Instructions (Signed)
Angiogram  An angiogram is a procedure used to examine the blood vessels. In this procedure, contrast dye is injected through a long, thin tube (catheter) into an artery. X-rays are then taken, which show if there is a blockage or problem in a blood vessel. The catheter may be inserted in:  Your groin area. This is the most common.  The fold of your arm, near your elbow.  Your wrist. Tell a health care provider about:  Any allergies you have, including allergies to shellfish or contrast dye.  All medicines you are taking, including vitamins, herbs, eye drops, creams, and over-the-counter medicines.  Any problems you or family members have had with anesthetic medicines.  Any blood disorders you have.  Any surgeries you have had.  Any previous kidney problems or failure you have had.  Any medical conditions you have.  Whether you are pregnant or may be pregnant.  Whether you are breastfeeding. What are the risks? Generally, this is a safe procedure. However, problems may occur, including:  Infection or bruising at the catheter area.  Damage to other structures or organs, including rupture of blood vessels or damage to arteries.  Allergic reaction to the contrast dye used.  Kidney damage from the contrast dye used.  Blood clots that can lead to a stroke or heart attack. What happens before the procedure? Staying hydrated Follow instructions from your health care provider about hydration, which may include:  Up to 2 hours before the procedure - you may continue to drink clear liquids, such as water, clear fruit juice, black coffee, and plain tea. Eating and drinking restrictions Follow instructions from your health care provider about eating and drinking, which may include:  8 hours before the procedure - stop eating heavy meals or foods such as meat, fried foods, or fatty foods.  6 hours before the procedure - stop eating light meals or foods, such as toast or cereal.   6 hours before the procedure - stop drinking milk or drinks that contain milk.  2 hours before the procedure - stop drinking clear liquids. General instructions  Ask your health care provider about: ? Changing or stopping your normal medicines. This is important if you take diabetes medicines or blood thinners. ? Taking medicines such as aspirin and ibuprofen. These medicines can thin your blood. Do not take these medicines before your procedure if your doctor tells you not to.  You may have blood samples taken.  Plan to have someone take you home from the hospital or clinic.  If you will be going home right after the procedure, plan to have someone with you for 24 hours. What happens during the procedure?  To reduce your risk of infection: ? Your health care team will wash or sanitize their hands. ? Your skin will be washed with soap. ? Hair may be removed from the insertion area.  You will lie on your back on an X-ray table. You may be strapped to the table if it is tilted.  An IV tube will be inserted into one of your veins.  Electrodes may be placed on your chest to monitor your heart rate during the procedure.  You will be given one or more of the following: ? A medicine to help you relax (sedative). ? A medicine to numb the area where the catheter will be inserted (local anesthetic).  The catheter will be inserted into an artery using a guide wire. A type of X-ray (fluoroscopy) will be used to help   guide the catheter to the blood vessel to be examined.  A contrast dye will then be injected into the catheter, and X-rays will be taken. The contrast will help to show where any narrowing or blockages are located in the blood vessels. You may feel flushed as the contrast dye is injected.  After the X-ray is complete, the catheter will be removed.  A bandage (dressing) will be placed over the site where the catheter was inserted. Pressure will be applied to help stop any  bleeding. The procedure may vary among health care providers and hospitals. What happens after the procedure?  Your blood pressure, heart rate, breathing rate, and blood oxygen level will be monitored until the medicines you were given have worn off.  You will be kept in bed lying flat for several hours. If the catheter was inserted through your leg, you will be instructed not to bend or cross your legs.  The insertion area and the pulse in your feet or wrist will be checked frequently.  You will be instructed to drink plenty of fluids. This will help wash the contrast dye out of your body.  Additional blood tests and X-rays may be done.  Tests to check the electrical activity in your heart (electrocardiogram) may be done.  Do not drive for 24 hours if you received a sedative.  It is up to you to get the results of your procedure. Ask your health care provider, or the department that is doing the procedure, when your results will be ready. Summary  An angiogram is a procedure used to examine the blood vessels.  In this procedure, contrast dye is injected through a long, thin tube (catheter) into an artery. X-rays are then taken.  Before the procedure, follow your health care provider's instructions about eating and drinking restrictions. You may be asked to stop eating and drinking several hours before the procedure.  After the procedure, you will need to lie flat for several hours and drink plenty of fluids. This information is not intended to replace advice given to you by your health care provider. Make sure you discuss any questions you have with your health care provider. Document Released: 06/13/2005 Document Revised: 08/16/2017 Document Reviewed: 10/10/2016 Elsevier Patient Education  2020 [employer]  

## 2019-06-30 NOTE — Assessment & Plan Note (Signed)
lipid control important in reducing the progression of atherosclerotic disease. Continue statin therapy  

## 2019-06-30 NOTE — Assessment & Plan Note (Addendum)
With short distance claudication.  CT findings reviewed with the patient in detail.  I have independently reviewed her study and she does have significant disease of her abdominal aorta just below the renal arteries.  It is difficult to discern from the CT scan if this would be something that would be amenable to endovascular therapy but certainly this will carry some risk due to its proximity to the renal arteries.  Given her age and other risk factors, open surgical therapy for a lesion in this area would be an extremely morbid procedure.  I think an angiogram would be prudent for further evaluation of the disease and if it is appropriate to treat endovascularly potentially treating it at the same time.  We may or may not have the appropriate size stent on the shelf given the location and it is possible that she will need a diagnostic angiogram followed by therapeutic intervention at a later date.  I discussed all of this with the patient in detail and she voices her understanding and is agreeable to proceed.  This is a somewhat complex and difficult location and overall situation for the patient.

## 2019-06-30 NOTE — Telephone Encounter (Signed)
[name] notified of Dr. Marliss Coots comments and Rx sent to pharmacy

## 2019-06-30 NOTE — Assessment & Plan Note (Signed)
blood pressure control important in reducing the progression of atherosclerotic disease. On appropriate oral medications.  

## 2019-06-30 NOTE — Progress Notes (Signed)
MRN : [id number]  Tracey [name] is a 83 y.o. (04-16-1936) female who presents with chief complaint of  Chief Complaint  Patient presents with   Follow-up    CT results  .  History of Present Illness: Patient returns today in follow up of her lower extremity claudication after her CT of the abdomen and pelvis which I have independently reviewed.  The official report is of an 59 to 90% aortic stenosis just below the renal arteries.  I think this is a reasonable estimate there is clearly significant disease in the aorta just below the renal arteries.  Her ABIs are reasonably well-preserved and is unlikely that she has a lot of disease distally with inflow disease being her primary problem.  She continues to have very short distance claudication and pain that wakes her from sleep.  Current Outpatient Medications  Medication Sig Dispense Refill   acetaminophen (TYLENOL) 500 MG tablet Take 1,000 mg by mouth every 6 (six) hours as needed for moderate pain or headache.     aspirin 81 MG tablet Take 81 mg by mouth daily.      clopidogrel (PLAVIX) 75 MG tablet TAKE 1 TABLET BY MOUTH EVERY DAY 90 tablet 3   glipiZIDE (GLUCOTROL XL) 2.5 MG 24 hr tablet TAKE 1 TABLET (2.5 MG TOTAL) BY MOUTH DAILY WITH BREAKFAST. 90 tablet 1   glucose blood (ONE TOUCH TEST STRIPS) test strip USE AS DIRECTED TO CHECK BLOOD SUGAR TWICE DAILY AND PRN E08.00 100 each 2   lisinopril (PRINIVIL,ZESTRIL) 10 MG tablet Take 1 tablet (10 mg total) by mouth daily. 90 tablet 3   metFORMIN (GLUCOPHAGE-XR) 500 MG 24 hr tablet TAKE 1 TABLET (500 MG TOTAL) BY MOUTH 2 (TWO) TIMES DAILY AFTER A MEAL. 180 tablet 0   OneTouch Delica Lancets [id number] MISC USE AS INSTRUCTED TO TEST BLOOD SUGAR TWICE DAILY AS NEEDED (dx. E08.00) 100 each 1   pantoprazole (PROTONIX) 40 MG tablet Take 1 tablet (40 mg total) by mouth daily. 90 tablet 1   rosuvastatin (CRESTOR) 40 MG tablet Take 1 tablet (40 mg total) by mouth daily. PLEASE CALL  [phone] TO SCHEDULE AN APPOINTMENT FOR FURTHER REFILLS. THANK YOU! 90 tablet 0   carvedilol (COREG) 3.125 MG tablet Take 1 tablet (3.125 mg total) by mouth as directed. Take 1 tablet (3.125 mg) twice a day and may take extra tablet (3.125 mg) for palpitations 270 tablet 3   nitroGLYCERIN (NITROSTAT) 0.4 MG SL tablet Place 1 tablet (0.4 mg total) under the tongue every 5 (five) minutes as needed for chest pain. 25 tablet 6   No current facility-administered medications for this visit.     Past Medical History:  Diagnosis Date   Anxiety    Arthritis    Atrial fibrillation ([place])    history   Coronary artery disease    Diabetes mellitus    non insulin dependent   Dyspnea    GERD (gastroesophageal reflux disease)    Heart murmur    Hyperlipidemia    Hypertension    Peripheral vascular disease ([place])    UTI (urinary tract infection)     Past Surgical History:  Procedure Laterality Date   APPENDECTOMY     BREAST BIOPSY Left    20 years ago   BREAST BIOPSY Right 2008   BREAST EXCISIONAL BIOPSY     rt 2008 lt "20 years ago"   CARDIAC CATHETERIZATION     CATARACT EXTRACTION W/PHACO Left 08/15/2015   Procedure: CATARACT  EXTRACTION PHACO AND INTRAOCULAR LENS PLACEMENT (IOC);  Surgeon: Estill Cotta, MD;  Location: [facility] ORS;  Service: Ophthalmology;  Laterality: Left;  [place] 01:28 AP% 26.4 CDE 35.73 fluid pack # [id number]   COLONOSCOPY WITH PROPOFOL N/A 01/16/2016   Procedure: COLONOSCOPY WITH PROPOFOL;  Surgeon: Manya Silvas, MD;  [facility] ENDOSCOPY;  Service: Endoscopy;  Laterality: N/A;   CORONARY ANGIOPLASTY WITH STENT PLACEMENT     CORONARY ARTERY BYPASS GRAFT     with LIMA  to the LAD, SVG to OM1, SVG to PDA   ESOPHAGOGASTRODUODENOSCOPY (EGD) WITH PROPOFOL N/A 01/16/2016   Procedure: ESOPHAGOGASTRODUODENOSCOPY (EGD) WITH PROPOFOL;  Surgeon: Manya Silvas, MD;  Location: [facility] ENDOSCOPY;  Service: Endoscopy;  Laterality: N/A;   RENAL ARTERY  STENT     left, By Dr Hulda Humphrey   REPLACEMENT TOTAL KNEE  03/25/2013   right   TONSILLECTOMY      Social History Social History   Tobacco Use   Smoking status: Never Smoker   Smokeless tobacco: Never Used  Substance Use Topics   Alcohol use: No   Drug use: No    Family History Family History  Problem Relation Age of Onset   Heart disease Father    Other Brother        open heart surgery   Cancer Brother    Other Brother        open heart surgery   Other Brother        open heart surgery   Hyperlipidemia Daughter    Breast cancer Neg Hx    Kidney cancer Neg Hx    Bladder Cancer Neg Hx     Allergies  Allergen Reactions   Ciprofloxacin Other (See Comments)    Unknown   Codeine Nausea Only   Erythromycin Nausea And Vomiting   Tramadol Other (See Comments)    Unknown     REVIEW OF SYSTEMS (Negative unless checked)  Constitutional: [] Weight loss  [] Fever  [] Chills Cardiac: [] Chest pain   [] Chest pressure   [] Palpitations   [] Shortness of breath when laying flat   [] Shortness of breath at rest   [] Shortness of breath with exertion. Vascular:  [x] Pain in legs with walking   [] Pain in legs at rest   [] Pain in legs when laying flat   [x] Claudication   [] Pain in feet when walking  [] Pain in feet at rest  [] Pain in feet when laying flat   [] History of DVT   [] Phlebitis   [] Swelling in legs   [] Varicose veins   [] Non-healing ulcers Pulmonary:   [] Uses home oxygen   [] Productive cough   [] Hemoptysis   [] Wheeze  [] COPD   [] Asthma Neurologic:  [] Dizziness  [] Blackouts   [] Seizures   [] History of stroke   [] History of TIA  [] Aphasia   [] Temporary blindness   [] Dysphagia   [] Weakness or numbness in arms   [] Weakness or numbness in legs Musculoskeletal:  [x] Arthritis   [] Joint swelling   [] Joint pain   [] Low back pain Hematologic:  [] Easy bruising  [] Easy bleeding   [] Hypercoagulable state   [] Anemic   Gastrointestinal:  [] Blood in stool   [] Vomiting blood   [x] Gastroesophageal reflux/heartburn   [] Abdominal pain Genitourinary:  [] Chronic kidney disease   [] Difficult urination  [] Frequent urination  [] Burning with urination   [] Hematuria Skin:  [] Rashes   [] Ulcers   [] Wounds Psychological:  [x] History of anxiety   []  History of major depression.  Physical Examination  BP 126/66 (BP Location: Right Arm)  Pulse 66    Resp 16    Ht 5\' 2"  (1.575 m)    Wt 127 lb (57.6 kg)    BMI 23.23 kg/m  Gen:  WD/WN, NAD.  Appears younger than stated age Head: [place]/AT, No temporalis wasting. Ear/Nose/Throat: Hearing grossly intact, nares w/o erythema or drainage Eyes: Conjunctiva clear. Sclera non-icteric Neck: Supple.  Trachea midline Pulmonary:  Good air movement, no use of accessory muscles.  Cardiac: RRR, no JVD Vascular:  Vessel Right Left  Radial Palpable Palpable                          PT  1+ palpable  1+ palpable  DP  1+ palpable  1+ palpable   Gastrointestinal: soft, non-tender/non-distended. No guarding/reflex.  Musculoskeletal: M/S 5/5 throughout.  No deformity or atrophy.  Trace lower extremity edema. Neurologic: Sensation grossly intact in extremities.  Symmetrical.  Speech is fluent.  Psychiatric: Judgment intact, Mood & affect appropriate for pt's clinical situation. Dermatologic: No rashes or ulcers noted.  No cellulitis or open wounds.       Labs Recent Results (from the past 2160 hour(s))  I-STAT creatinine     Status: Abnormal   Collection Time: 06/11/19  3:06 PM  Result Value Ref Range   Creatinine, Ser 1.40 (H) 0.44 - 1.00 mg/dL  HgB A1c     Status: Abnormal   Collection Time: 06/12/19 10:30 AM  Result Value Ref Range   Hemoglobin A1C 6.5 (A) 4.0 - 5.6 %   HbA1c POC (<> result, manual entry)     HbA1c, POC (prediabetic range)     HbA1c, POC (controlled diabetic range)    Novel Coronavirus, NAA ([facility])     Status: None   Collection Time: 06/16/19 12:00 AM   Specimen: Oropharyngeal(OP) collection in vial  transport medium   OROPHARYNGEA  TESTING  Result Value Ref Range   SARS-CoV-2, NAA Not Detected Not Detected    Comment: This nucleic acid amplification test was developed and its performance characteristics determined by [employer]. Nucleic acid amplification tests include PCR and TMA. This test has not been FDA cleared or approved. This test has been authorized by FDA under an Emergency Use Authorization (EUA). This test is only authorized for the duration of time the declaration that circumstances exist justifying the authorization of the emergency use of in vitro diagnostic tests for detection of SARS-CoV-2 virus and/or diagnosis of COVID-19 infection under section 564(b)(1) of the Act, 21 U.S.C. [id number]) (1), unless the authorization is terminated or revoked sooner. When diagnostic testing is negative, the possibility of a false negative result should be considered in the context of a patient's recent exposures and the presence of clinical signs and symptoms consistent with COVID-19. An individual without symptoms of COVID-19 and who is not shedding SARS-CoV-2 virus would  expect to have a negative (not detected) result in this assay.   POCT urinalysis dipstick     Status: Abnormal   Collection Time: 06/25/19 11:15 AM  Result Value Ref Range   Color, UA yellow    Clarity, UA clear    Glucose, UA Negative Negative   Bilirubin, UA neg    Ketones, UA neg    Spec Grav, UA 1.020 1.010 - 1.025   Blood, UA mod    pH, UA 5.0 5.0 - 8.0   Protein, UA Positive (A) Negative   Urobilinogen, UA 0.2 0.2 or 1.0 E.U./dL   Nitrite, UA neg  Leukocytes, UA Negative Negative   Appearance     Odor    Urine Culture     Status: None   Collection Time: 06/25/19 11:45 AM   Specimen: Urine   UR  Result Value Ref Range   Urine Culture, Routine Final report    Organism ID, Bacteria Comment     Comment: Culture shows less than 10,000 colony forming units of bacteria  per milliliter of urine. This colony count is not generally considered to be clinically significant.   POCT Urinalysis Dipstick (Automated)     Status: Abnormal   Collection Time: 06/30/19  2:35 PM  Result Value Ref Range   Color, UA Yellow    Clarity, UA Cloudy    Glucose, UA Negative Negative   Bilirubin, UA Negative    Ketones, UA Negative    Spec Grav, UA 1.020 1.010 - 1.025   Blood, UA 25 Ery/uL    pH, UA 6.0 5.0 - 8.0   Protein, UA Positive (A) Negative    Comment: 30 mg/dL   Urobilinogen, UA 0.2 0.2 or 1.0 E.U./dL   Nitrite, UA Positive    Leukocytes, UA Large (3+) (A) Negative    Radiology Ct Angio Abd/pel W/ And/or W/o  Result Date: 06/11/2019 CLINICAL DATA:  History of prior placement of left renal artery stent. Recent renal duplex ultrasound demonstrated aortic stenosis with elevated velocities in the proximal to mid abdominal aorta. EXAM: CT ANGIOGRAPHY ABDOMEN AND PELVIS WITH CONTRAST AND WITHOUT CONTRAST TECHNIQUE: Multidetector CT imaging of the abdomen and pelvis was performed using the standard protocol during bolus administration of intravenous contrast. Multiplanar reconstructed images and MIPs were obtained and reviewed to evaluate the vascular anatomy. CONTRAST:  28mL OMNIPAQUE IOHEXOL 350 MG/ML SOLN COMPARISON:  Report from a renal duplex ultrasound on 05/27/2019 FINDINGS: VASCULAR Aorta: The entire abdominal aorta is heavily calcified. There is no evidence of aneurysmal disease. Beginning at the level of the previously placed left renal artery stent and continuing just below the left renal artery origin, heavily calcified eccentric plaque causes significant irregular luminal stenosis of the aorta of approximately 80-90%. No component of dissection or penetrating ulcer disease identified. The calcified plaque is highly irregular and likely ulcerated. The rest of the aorta demonstrates no significant stenosis. There is no evidence of vasculitis. Celiac: Calcified plaque  along the superior margin of the celiac origin without evidence of significant stenosis. Distal branch vessels appear normally patent and demonstrate normal branching anatomy. SMA: Mild calcified plaque at the origin of the SMA without evidence of stenosis. Distal branch vessels appear normally patent. Renals: Single left renal artery demonstrates a proximal intravascular stent extending from the orifice over a length of approximately 16 mm. The stent is patent and demonstrates no internal stenosis. An early branch originating along the inferior margin of the mid stent remains open and supplies the posterior upper pole. Other distal branches appear normally patent. Calcified plaque is present at the origin of a single right renal artery causing approximately 40-50% stenosis. The right renal artery demonstrates early bifurcation and normally patent distal branches. IMA: A small caliber IMA is patent. Inflow: Bilateral common iliac arteries demonstrate calcified plaque without significant stenosis. Bilateral external and internal iliac arteries are normally patent. No iliac aneurysmal disease. Proximal Outflow: Bilateral common femoral arteries and femoral bifurcations are normally patent. Veins: Delayed imaging through the level of the kidneys demonstrates no venous abnormalities. Review of the MIP images confirms the above findings. NON-VASCULAR Lower chest: No acute abnormality. Hepatobiliary:  No focal liver abnormality is seen. No gallstones, gallbladder wall thickening, or biliary dilatation. Pancreas: Unremarkable. No pancreatic ductal dilatation or surrounding inflammatory changes. Spleen: Normal in size without focal abnormality. Adrenals/Urinary Tract: Adrenal glands are unremarkable. Kidneys are normal, without renal calculi, focal lesion, or hydronephrosis. Bladder is unremarkable. Stomach/Bowel: The descending and sigmoid colon demonstrate diverticulosis. No evidence of diverticulitis. No bowel obstruction  or free air identified. Lymphatic: No enlarged lymph nodes identified. Reproductive: Uterus and bilateral adnexa are unremarkable. Other: No abdominal wall hernia or abnormality. No abdominopelvic ascites. Musculoskeletal: The spine demonstrates osteopenia as well as diffuse degenerative disc disease. There is a mild anterolisthesis of L4 on L5 of approximately 5 mm. No fractures or bony lesions identified. IMPRESSION: VASCULAR 1. Significant focal stenosis of the mid abdominal aorta beginning at the origin of a left renal artery stent and continuing just below the inferior stent margin. Stenosis is caused by eccentric and highly irregular calcified plaque causing roughly 80-90% maximal narrowing. 2. Patent proximal left renal artery stent. An early branch supplying the posterior upper pole of the left kidney is patent off of the inferior aspect of the mid stent. 3. Non critical estimated 40-50% proximal right renal artery stenosis. NON-VASCULAR 1. Diverticulosis of the colon without diverticulitis. 2. Osteopenia and diffuse degenerative disc disease of the spine. Mild anterolisthesis of L4 on L5. Electronically Signed   By: Aletta Edouard M.D.   On: 06/11/2019 16:54    Assessment/Plan  Mixed hyperlipidemia lipid control important in reducing the progression of atherosclerotic disease. Continue statin therapy   Type 2 diabetes mellitus with stage 2 chronic kidney disease, without long-term current use of insulin (HCC) blood glucose control important in reducing the progression of atherosclerotic disease. Also, involved in wound healing. On appropriate medications.   Essential hypertension, benign blood pressure control important in reducing the progression of atherosclerotic disease. On appropriate oral medications.   Renal artery stenosis (HCC) Renal artery stents appear to have good flow both with CT and by duplex.  These can be checked annually.  PAD (peripheral artery disease) ([place]) With  short distance claudication.  CT findings reviewed with the patient in detail.  I have independently reviewed her study and she does have significant disease of her abdominal aorta just below the renal arteries.  It is difficult to discern from the CT scan if this would be something that would be amenable to endovascular therapy but certainly this will carry some risk due to its proximity to the renal arteries.  Given her age and other risk factors, open surgical therapy for a lesion in this area would be an extremely morbid procedure.  I think an angiogram would be prudent for further evaluation of the disease and if it is appropriate to treat endovascularly potentially treating it at the same time.  We may or may not have the appropriate size stent on the shelf given the location and it is possible that she will need a diagnostic angiogram followed by therapeutic intervention at a later date.  I discussed all of this with the patient in detail and she voices her understanding and is agreeable to proceed.  This is a somewhat complex and difficult location and overall situation for the patient.    [name], MD  06/30/2019 2:57 PM    This note was created with Dragon medical transcription system.  Any errors from dictation are purely unintentional

## 2019-06-30 NOTE — Telephone Encounter (Signed)
Voicemail message left for patient- per MS she would like patient to followup with her PCP.

## 2019-06-30 NOTE — Assessment & Plan Note (Signed)
Renal artery stents appear to have good flow both with CT and by duplex.  These can be checked annually.

## 2019-06-30 NOTE — Telephone Encounter (Signed)
-----   Message from Abner Greenspan, MD sent at 06/30/2019  4:46 PM EDT ----- Urinalysis has nitrite and leukocytes (neither of which was positive 5 d ago) Please draw up for culture  Please send to her pharmacy keflex 500 mg bid for 7d #14 no refills  Will update when cx returns Please alert [place] if symptoms suddenly worsen

## 2019-07-01 ENCOUNTER — Telehealth (INDEPENDENT_AMBULATORY_CARE_PROVIDER_SITE_OTHER): Payer: Self-pay

## 2019-07-01 NOTE — Telephone Encounter (Signed)
Spoke with the patient and she is now scheduled with [name] for aortogram on 07/08/2019 with a 8:15 am to the [facility]. Patient will do her Covid testing on 07/03/2019 between 12:30-2:30 pm at the [facility]. Pre-procedure instructions were discussed and mailed to her.

## 2019-07-02 ENCOUNTER — Telehealth (INDEPENDENT_AMBULATORY_CARE_PROVIDER_SITE_OTHER): Payer: Self-pay

## 2019-07-02 LAB — URINE CULTURE
MICRO NUMBER:: 984247
SPECIMEN QUALITY:: ADEQUATE

## 2019-07-02 NOTE — Telephone Encounter (Signed)
Patient has been aware with medical advice and verbalize understanding

## 2019-07-02 NOTE — Telephone Encounter (Signed)
No it shouldn't cause any issues

## 2019-07-03 ENCOUNTER — Telehealth: Payer: Self-pay | Admitting: *Deleted

## 2019-07-03 ENCOUNTER — Other Ambulatory Visit
Admission: RE | Admit: 2019-07-03 | Discharge: 2019-07-03 | Disposition: A | Discharge status: Home / Self Care | Payer: Medicare HMO | Source: Ambulatory Visit | Attending: Vascular Surgery | Admitting: Vascular Surgery

## 2019-07-03 DIAGNOSIS — Z20828 Contact with and (suspected) exposure to other viral communicable diseases: Secondary | ICD-10-CM | POA: Diagnosis not present

## 2019-07-03 NOTE — Telephone Encounter (Signed)
When advising Tracey [name] (granddaughter) about urine cx. Tracey [name] wanted me to send a FYI to [name].  She said : "Please let tower know that [name] is sending her an update.  He is doing urgent surgery on her next Wednesday to place 2 stents in her aorta near the left renal artery stent.  This is going to be a dangerous and risky procedure.  If i don't have COVID, i will be taking the day off to be with her as she can have only 1 person at the hospital for her surgery. of course i can't sound all nasally either i am sure. lol.  but i want to be sure Tower knows.  thank you for the update!"

## 2019-07-03 NOTE — Telephone Encounter (Signed)
Aware, have seen that in her chart Thanks

## 2019-07-04 LAB — SARS CORONAVIRUS 2 (TAT 6-24 HRS): SARS Coronavirus 2: NEGATIVE

## 2019-07-07 ENCOUNTER — Other Ambulatory Visit (INDEPENDENT_AMBULATORY_CARE_PROVIDER_SITE_OTHER): Payer: Self-pay | Admitting: Nurse Practitioner

## 2019-07-08 ENCOUNTER — Encounter
Admission: RE | Disposition: A | Discharge status: Home / Self Care | Payer: Self-pay | Source: Home / Self Care | Attending: Vascular Surgery

## 2019-07-08 ENCOUNTER — Encounter: Payer: Self-pay | Admitting: *Deleted

## 2019-07-08 ENCOUNTER — Ambulatory Visit
Admission: RE | Admit: 2019-07-08 | Discharge: 2019-07-08 | Disposition: A | Discharge status: Home / Self Care | Payer: Medicare HMO | Attending: Vascular Surgery | Admitting: Vascular Surgery

## 2019-07-08 ENCOUNTER — Other Ambulatory Visit: Payer: Self-pay

## 2019-07-08 DIAGNOSIS — I2510 Atherosclerotic heart disease of native coronary artery without angina pectoris: Secondary | ICD-10-CM | POA: Diagnosis not present

## 2019-07-08 DIAGNOSIS — N182 Chronic kidney disease, stage 2 (mild): Secondary | ICD-10-CM | POA: Insufficient documentation

## 2019-07-08 DIAGNOSIS — F419 Anxiety disorder, unspecified: Secondary | ICD-10-CM | POA: Diagnosis not present

## 2019-07-08 DIAGNOSIS — I129 Hypertensive chronic kidney disease with stage 1 through stage 4 chronic kidney disease, or unspecified chronic kidney disease: Secondary | ICD-10-CM | POA: Insufficient documentation

## 2019-07-08 DIAGNOSIS — E1122 Type 2 diabetes mellitus with diabetic chronic kidney disease: Secondary | ICD-10-CM | POA: Insufficient documentation

## 2019-07-08 DIAGNOSIS — Z7984 Long term (current) use of oral hypoglycemic drugs: Secondary | ICD-10-CM | POA: Insufficient documentation

## 2019-07-08 DIAGNOSIS — I70213 Atherosclerosis of native arteries of extremities with intermittent claudication, bilateral legs: Secondary | ICD-10-CM | POA: Diagnosis not present

## 2019-07-08 DIAGNOSIS — E782 Mixed hyperlipidemia: Secondary | ICD-10-CM | POA: Insufficient documentation

## 2019-07-08 DIAGNOSIS — Z7982 Long term (current) use of aspirin: Secondary | ICD-10-CM | POA: Insufficient documentation

## 2019-07-08 DIAGNOSIS — I4891 Unspecified atrial fibrillation: Secondary | ICD-10-CM | POA: Insufficient documentation

## 2019-07-08 DIAGNOSIS — E1151 Type 2 diabetes mellitus with diabetic peripheral angiopathy without gangrene: Secondary | ICD-10-CM | POA: Diagnosis present

## 2019-07-08 DIAGNOSIS — K219 Gastro-esophageal reflux disease without esophagitis: Secondary | ICD-10-CM | POA: Insufficient documentation

## 2019-07-08 DIAGNOSIS — Z79899 Other long term (current) drug therapy: Secondary | ICD-10-CM | POA: Insufficient documentation

## 2019-07-08 DIAGNOSIS — I701 Atherosclerosis of renal artery: Secondary | ICD-10-CM

## 2019-07-08 DIAGNOSIS — R69 Illness, unspecified: Secondary | ICD-10-CM | POA: Diagnosis not present

## 2019-07-08 HISTORY — PX: LOWER EXTREMITY ANGIOGRAPHY: CATH118251

## 2019-07-08 LAB — CREATININE, SERUM
Creatinine, Ser: 1.28 mg/dL — ABNORMAL HIGH (ref 0.44–1.00)
GFR calc Af Amer: 46 mL/min — ABNORMAL LOW (ref >60)
GFR calc non Af Amer: 39 mL/min — ABNORMAL LOW (ref >60)

## 2019-07-08 LAB — BUN: BUN: 32 mg/dL — ABNORMAL HIGH (ref 8–23)

## 2019-07-08 LAB — GLUCOSE, CAPILLARY: Glucose-Capillary: 119 mg/dL — ABNORMAL HIGH (ref 70–99)

## 2019-07-08 SURGERY — LOWER EXTREMITY ANGIOGRAPHY
Anesthesia: Moderate Sedation

## 2019-07-08 MED ORDER — CEFAZOLIN SODIUM-DEXTROSE 2-4 GM/100ML-% IV SOLN
INTRAVENOUS | Status: AC
  Filled 2019-07-08: qty 100

## 2019-07-08 MED ORDER — CEFAZOLIN SODIUM-DEXTROSE 2-4 GM/100ML-% IV SOLN
2.0000 g | Freq: Once | INTRAVENOUS | Status: AC
  Administered 2019-07-08: 2 g via INTRAVENOUS

## 2019-07-08 MED ORDER — MIDAZOLAM HCL 2 MG/2ML IJ SOLN
INTRAMUSCULAR | Status: DC | PRN
  Administered 2019-07-08: 2 mg via INTRAVENOUS

## 2019-07-08 MED ORDER — MIDAZOLAM HCL 5 MG/5ML IJ SOLN
INTRAMUSCULAR | Status: AC
  Filled 2019-07-08: qty 5

## 2019-07-08 MED ORDER — FAMOTIDINE 20 MG PO TABS: 40.0000 mg | ORAL_TABLET | Freq: Once | ORAL | Status: DC | PRN

## 2019-07-08 MED ORDER — IODIXANOL 320 MG/ML IV SOLN
INTRAVENOUS | Status: DC | PRN
  Administered 2019-07-08: 10:00:00 30 mL via INTRA_ARTERIAL

## 2019-07-08 MED ORDER — DIPHENHYDRAMINE HCL 50 MG/ML IJ SOLN: 50.0000 mg | Freq: Once | INTRAMUSCULAR | Status: DC | PRN

## 2019-07-08 MED ORDER — FENTANYL CITRATE (PF) 100 MCG/2ML IJ SOLN
INTRAMUSCULAR | Status: DC | PRN
  Administered 2019-07-08: 50 ug via INTRAVENOUS

## 2019-07-08 MED ORDER — MIDAZOLAM HCL 2 MG/ML PO SYRP: 8.0000 mg | ORAL_SOLUTION | Freq: Once | ORAL | Status: DC | PRN

## 2019-07-08 MED ORDER — HYDROMORPHONE HCL 1 MG/ML IJ SOLN: 1.0000 mg | Freq: Once | INTRAMUSCULAR | Status: DC | PRN

## 2019-07-08 MED ORDER — SODIUM CHLORIDE 0.9 % IV SOLN
INTRAVENOUS | Status: DC
  Administered 2019-07-08: 09:00:00 via INTRAVENOUS

## 2019-07-08 MED ORDER — ONDANSETRON HCL 4 MG/2ML IJ SOLN: 4.0000 mg | Freq: Four times a day (QID) | INTRAMUSCULAR | Status: DC | PRN

## 2019-07-08 MED ORDER — METHYLPREDNISOLONE SODIUM SUCC 125 MG IJ SOLR: 125.0000 mg | Freq: Once | INTRAMUSCULAR | Status: DC | PRN

## 2019-07-08 MED ORDER — FENTANYL CITRATE (PF) 100 MCG/2ML IJ SOLN
INTRAMUSCULAR | Status: AC
  Filled 2019-07-08: qty 2

## 2019-07-08 MED ORDER — HEPARIN SODIUM (PORCINE) 1000 UNIT/ML IJ SOLN
INTRAMUSCULAR | Status: AC
  Filled 2019-07-08: qty 1

## 2019-07-08 SURGICAL SUPPLY — 10 items
CATH PIG 70CM (CATHETERS) ×1 IMPLANT
COVER PROBE U/S 5X48 (MISCELLANEOUS) ×1 IMPLANT
DEVICE PRESTO INFLATION (MISCELLANEOUS) IMPLANT
DEVICE STARCLOSE SE CLOSURE (Vascular Products) ×1 IMPLANT
PACK ANGIOGRAPHY (CUSTOM PROCEDURE TRAY) ×2 IMPLANT
SHEATH BRITE TIP 5FRX11 (SHEATH) ×1 IMPLANT
SYR MEDRAD MARK 7 150ML (SYRINGE) ×1 IMPLANT
TUBING CONTRAST HIGH PRESS 72 (TUBING) ×1 IMPLANT
WIRE J 3MM .035X145CM (WIRE) ×1 IMPLANT
WIRE MAGIC TORQUE 260C (WIRE) ×1 IMPLANT

## 2019-07-08 NOTE — H&P (Signed)
[facility] History & Physical Update  The patient was interviewed and re-examined.  The patient's previous History and Physical has been reviewed and is unchanged.  There is no change in the plan of care. We plan to proceed with the scheduled procedure.  [name], MD  07/08/2019, 8:19 AM

## 2019-07-08 NOTE — Discharge Instructions (Signed)

## 2019-07-08 NOTE — Op Note (Signed)
[facility]  Percutaneous Study/Intervention Procedural Note   Date of Surgery: 07/08/2019  Surgeon(s):Noriko Macari    Assistants:none  Pre-operative Diagnosis: PAD with disabling claudication bilateral lower extremities, aortic stenosis on CT scan  Post-operative diagnosis:  Same  Procedure(s) Performed:             1.  Ultrasound guidance for vascular access right femoral artery             2.  Catheter placement into aorta from right femoral approach             3.  Aortogram              4.  StarClose closure device right femoral artery  EBL: 5 cc  Contrast: 30 cc  Fluoro Time: 1.1 minutes  Moderate Conscious Sedation Time: approximately 25 minutes using 2 mg of Versed and 50 mcg of Fentanyl              Indications:  Patient is a 83 y.o.female with disabling claudication symptoms. The patient has had a CT scan which I have reviewed which showed an irregular aortic lesion near the renal artery and it was unclear whether or not this would be amenable to treatment. The patient is brought in for angiography for further evaluation and potential treatment.  Due to the limb threatening nature of the situation, angiogram was performed for attempted limb salvage. The patient is aware that if the procedure fails, amputation would be expected.  The patient also understands that even with successful revascularization, amputation may still be required due to the severity of the situation.  Risks and benefits are discussed and informed consent is obtained.   Procedure:  The patient was identified and appropriate procedural time out was performed.  The patient was then placed supine on the table and prepped and draped in the usual sterile fashion. Moderate conscious sedation was administered during a face to face encounter with the patient throughout the procedure with my supervision of the RN administering medicines and monitoring the patient's vital signs, pulse oximetry,  telemetry and mental status throughout from the start of the procedure until the patient was taken to the recovery room. Ultrasound was used to evaluate the right common femoral artery.  It was patent .  A digital ultrasound image was acquired.  A Seldinger needle was used to access the right common femoral artery under direct ultrasound guidance and a permanent image was performed.  A 0.035 J wire was advanced without resistance and a 5Fr sheath was placed.  Pigtail catheter was placed into the aorta and an AP aortogram was performed. This demonstrated a very irregular calcific lesion in the aorta at the level of the left renal artery and extending down about 2 to 3 cm below this.  This was in the 80 to 85% range.  The left renal stent was widely patent.  The right renal artery takeoff is approximately 1 cm above the left renal artery.  The SMA appeared to be widely patent.  The distal aorta and iliac arteries were widely patent.  We then took some measurements to try to evaluate the aorta and the lesion to determine whether or not would be appropriate to treat.  The aorta measured in the 14 mm range at its normal caliber but the area of stenosis was much tighter to this.  The lesion length was somewhere in the 3 cm range.  The left renal artery stent was patent but the plaque  came up directly across the left renal stent and any intervention to the aorta from just to femoral access would potentially threaten the left renal stent.  I felt the best option would be to bring her back under anesthesia and come from the arm and snorkel a stent out the left renal artery further up into the aorta and then extend the stent up near the base of the right renal artery to encompass the lesion and treated thoroughly.  This will be done another day. I elected to terminate the procedure. The sheath was removed and StarClose closure device was deployed in the right femoral artery with excellent hemostatic result. The patient was  taken to the recovery room in stable condition having tolerated the procedure well.  Findings:               Aortogram:  A very irregular calcific lesion in the aorta at the level of the left renal artery and extending down about 2 to 3 cm below this.  This was in the 80 to 85% range.  The left renal stent was widely patent.  The right renal artery takeoff is approximately 1 cm above the left renal artery.  The SMA appeared to be widely patent.  The distal aorta and iliac arteries were widely patent.         Disposition: Patient was taken to the recovery room in stable condition having tolerated the procedure well.  Complications: None  Tracey [name] 07/08/2019 10:30 AM   This note was created with Dragon Medical transcription system. Any errors in dictation are purely unintentional.

## 2019-07-10 ENCOUNTER — Telehealth (INDEPENDENT_AMBULATORY_CARE_PROVIDER_SITE_OTHER): Payer: Self-pay

## 2019-07-10 NOTE — Telephone Encounter (Signed)
Spoke with the patient and she is now scheduled with [name] for angio on 07/29/2019 with a 11:30 am arrival time to the [facility]. Patient will do her Covid testing on 07/24/2019 between 12:30-2:30 pm at the [facility]. Pre-procedure instructions were discussed and will be mailed to the patient.

## 2019-07-24 ENCOUNTER — Other Ambulatory Visit: Admission: RE | Admit: 2019-07-24 | Payer: Medicare HMO | Source: Ambulatory Visit

## 2019-07-27 ENCOUNTER — Other Ambulatory Visit
Admission: RE | Admit: 2019-07-27 | Discharge: 2019-07-27 | Disposition: A | Discharge status: Home / Self Care | Payer: Medicare HMO | Source: Ambulatory Visit | Attending: Vascular Surgery | Admitting: Vascular Surgery

## 2019-07-27 ENCOUNTER — Other Ambulatory Visit: Payer: Self-pay | Admitting: Family Medicine

## 2019-07-27 ENCOUNTER — Other Ambulatory Visit: Payer: Self-pay

## 2019-07-27 DIAGNOSIS — Z01812 Encounter for preprocedural laboratory examination: Secondary | ICD-10-CM | POA: Diagnosis not present

## 2019-07-27 DIAGNOSIS — Z20828 Contact with and (suspected) exposure to other viral communicable diseases: Secondary | ICD-10-CM | POA: Insufficient documentation

## 2019-07-27 LAB — SARS CORONAVIRUS 2 (TAT 6-24 HRS): SARS Coronavirus 2: NEGATIVE

## 2019-07-28 ENCOUNTER — Telehealth (INDEPENDENT_AMBULATORY_CARE_PROVIDER_SITE_OTHER): Payer: Self-pay

## 2019-07-28 ENCOUNTER — Encounter (INDEPENDENT_AMBULATORY_CARE_PROVIDER_SITE_OTHER): Payer: Self-pay

## 2019-07-28 NOTE — Telephone Encounter (Signed)
Spoke with the patient regarding her procedure and that it has to be rescheduled to a provider not being available due to sickness. Patient was understanding and is now rescheduled to 08/26/2019 with a 6:45 am arrival time to the [facility]. Patient will do Covid testing on 08/21/2019 between 12:30-2:30 pm at the [facility]. New paperwork will be mailed to the patient.

## 2019-07-28 NOTE — Telephone Encounter (Signed)
Will route to Dr. Dorris Fetch will has taken over DM care

## 2019-07-29 ENCOUNTER — Other Ambulatory Visit: Payer: Medicare HMO

## 2019-08-05 ENCOUNTER — Telehealth: Payer: Self-pay

## 2019-08-05 ENCOUNTER — Ambulatory Visit (INDEPENDENT_AMBULATORY_CARE_PROVIDER_SITE_OTHER): Payer: Medicare HMO

## 2019-08-05 DIAGNOSIS — E538 Deficiency of other specified B group vitamins: Secondary | ICD-10-CM

## 2019-08-05 DIAGNOSIS — R300 Dysuria: Secondary | ICD-10-CM | POA: Diagnosis not present

## 2019-08-05 LAB — POC URINALSYSI DIPSTICK (AUTOMATED)
Bilirubin, UA: NEGATIVE
Glucose, UA: NEGATIVE
Ketones, UA: NEGATIVE
Nitrite, UA: NEGATIVE
Protein, UA: POSITIVE — AB
Spec Grav, UA: 1.015 (ref 1.010–1.025)
Urobilinogen, UA: NEGATIVE E.U./dL — AB
pH, UA: 6 (ref 5.0–8.0)

## 2019-08-05 MED ORDER — CEPHALEXIN 500 MG PO CAPS: 500.0000 mg | ORAL_CAPSULE | Freq: Three times a day (TID) | ORAL | 0 refills | Status: DC

## 2019-08-05 MED ORDER — CYANOCOBALAMIN 1000 MCG/ML IJ SOLN
1000.0000 ug | Freq: Once | INTRAMUSCULAR | Status: AC
  Administered 2019-08-05: 1000 ug via INTRAMUSCULAR

## 2019-08-05 NOTE — Telephone Encounter (Signed)
Patient aware. Thanks.

## 2019-08-05 NOTE — Telephone Encounter (Signed)
Patient calls in to request a urinalysis for pain, burning and difficulty initiating stream.   Added on to [place] schedule this afternoon.   Please refer to NV for Ua details.  Also due for B12 injection.   Send R/X to [facility] [address].

## 2019-08-05 NOTE — Telephone Encounter (Signed)
Treating for positive ua and uti symptoms   Pt has renal insuff cx pending  [facility] [address]   Let me know if this is not the correct pharmacy   Thanks

## 2019-08-05 NOTE — Progress Notes (Signed)
Per orders of [name] injection of Vitamin B12 given by Diamond Nickel, RN. Patient tolerated injection well.

## 2019-08-08 LAB — URINE CULTURE
MICRO NUMBER:: 1114702
SPECIMEN QUALITY:: ADEQUATE

## 2019-08-20 ENCOUNTER — Telehealth: Payer: Self-pay

## 2019-08-20 ENCOUNTER — Other Ambulatory Visit: Payer: Medicare HMO

## 2019-08-20 DIAGNOSIS — R300 Dysuria: Secondary | ICD-10-CM | POA: Diagnosis not present

## 2019-08-20 MED ORDER — CEPHALEXIN 500 MG PO CAPS: 500.0000 mg | ORAL_CAPSULE | Freq: Two times a day (BID) | ORAL | 0 refills | Status: DC

## 2019-08-20 NOTE — Telephone Encounter (Signed)
[name] notified of Dr. Marliss Coots comments and Rx sent to pharmacy, pt will not start med until we get urine sample

## 2019-08-20 NOTE — Telephone Encounter (Signed)
Get another culture please  Send keflex (pended) to her pref pharmacy Once she has given a sample she can re start the abx while we wait for result   If resistant we will change meds  Keep me posted

## 2019-08-20 NOTE — Telephone Encounter (Signed)
Patient's granddaughter, Leafy Ro, states patient finished keflex for UTI last month and symptom got better but in the last 48 hours she started having issues again. Urinary frequency, difficulty urinating, burning. She is drinking plenty of fluids and taking AZO. No fever. How does patient need to proceed? Let Leafy Ro know please. Thank you

## 2019-08-21 ENCOUNTER — Other Ambulatory Visit: Payer: Self-pay

## 2019-08-21 ENCOUNTER — Other Ambulatory Visit
Admission: RE | Admit: 2019-08-21 | Discharge: 2019-08-21 | Disposition: A | Discharge status: Home / Self Care | Payer: Medicare HMO | Source: Ambulatory Visit | Attending: Vascular Surgery | Admitting: Vascular Surgery

## 2019-08-21 DIAGNOSIS — Z01812 Encounter for preprocedural laboratory examination: Secondary | ICD-10-CM | POA: Diagnosis not present

## 2019-08-21 DIAGNOSIS — Z20828 Contact with and (suspected) exposure to other viral communicable diseases: Secondary | ICD-10-CM | POA: Insufficient documentation

## 2019-08-21 LAB — URINE CULTURE
MICRO NUMBER:: 1160321
SPECIMEN QUALITY:: ADEQUATE

## 2019-08-21 LAB — SARS CORONAVIRUS 2 (TAT 6-24 HRS): SARS Coronavirus 2: NEGATIVE

## 2019-08-25 ENCOUNTER — Other Ambulatory Visit (INDEPENDENT_AMBULATORY_CARE_PROVIDER_SITE_OTHER): Payer: Self-pay | Admitting: Nurse Practitioner

## 2019-08-26 ENCOUNTER — Ambulatory Visit
Admission: RE | Admit: 2019-08-26 | Discharge: 2019-08-26 | Disposition: A | Discharge status: Home / Self Care | Payer: Medicare HMO | Attending: Vascular Surgery | Admitting: Vascular Surgery

## 2019-08-26 ENCOUNTER — Encounter: Payer: Self-pay | Admitting: Anesthesiology

## 2019-08-26 ENCOUNTER — Encounter
Admission: RE | Disposition: A | Discharge status: Home / Self Care | Payer: Self-pay | Source: Home / Self Care | Attending: Vascular Surgery

## 2019-08-26 ENCOUNTER — Other Ambulatory Visit: Payer: Self-pay

## 2019-08-26 DIAGNOSIS — E782 Mixed hyperlipidemia: Secondary | ICD-10-CM | POA: Insufficient documentation

## 2019-08-26 DIAGNOSIS — I129 Hypertensive chronic kidney disease with stage 1 through stage 4 chronic kidney disease, or unspecified chronic kidney disease: Secondary | ICD-10-CM | POA: Insufficient documentation

## 2019-08-26 DIAGNOSIS — E1151 Type 2 diabetes mellitus with diabetic peripheral angiopathy without gangrene: Secondary | ICD-10-CM | POA: Diagnosis not present

## 2019-08-26 DIAGNOSIS — Z881 Allergy status to other antibiotic agents status: Secondary | ICD-10-CM | POA: Insufficient documentation

## 2019-08-26 DIAGNOSIS — K219 Gastro-esophageal reflux disease without esophagitis: Secondary | ICD-10-CM | POA: Insufficient documentation

## 2019-08-26 DIAGNOSIS — I739 Peripheral vascular disease, unspecified: Secondary | ICD-10-CM

## 2019-08-26 DIAGNOSIS — E1122 Type 2 diabetes mellitus with diabetic chronic kidney disease: Secondary | ICD-10-CM | POA: Diagnosis not present

## 2019-08-26 DIAGNOSIS — I701 Atherosclerosis of renal artery: Secondary | ICD-10-CM | POA: Diagnosis not present

## 2019-08-26 DIAGNOSIS — I4891 Unspecified atrial fibrillation: Secondary | ICD-10-CM | POA: Diagnosis not present

## 2019-08-26 DIAGNOSIS — I2510 Atherosclerotic heart disease of native coronary artery without angina pectoris: Secondary | ICD-10-CM | POA: Diagnosis not present

## 2019-08-26 DIAGNOSIS — I700 Atherosclerosis of aorta: Secondary | ICD-10-CM

## 2019-08-26 DIAGNOSIS — I350 Nonrheumatic aortic (valve) stenosis: Secondary | ICD-10-CM | POA: Insufficient documentation

## 2019-08-26 DIAGNOSIS — M1990 Unspecified osteoarthritis, unspecified site: Secondary | ICD-10-CM | POA: Insufficient documentation

## 2019-08-26 DIAGNOSIS — N182 Chronic kidney disease, stage 2 (mild): Secondary | ICD-10-CM | POA: Diagnosis not present

## 2019-08-26 DIAGNOSIS — I70213 Atherosclerosis of native arteries of extremities with intermittent claudication, bilateral legs: Secondary | ICD-10-CM | POA: Diagnosis not present

## 2019-08-26 HISTORY — PX: RENAL ANGIOGRAPHY: CATH118260

## 2019-08-26 LAB — BUN: BUN: 31 mg/dL — ABNORMAL HIGH (ref 8–23)

## 2019-08-26 LAB — CREATININE, SERUM
Creatinine, Ser: 1.3 mg/dL — ABNORMAL HIGH (ref 0.44–1.00)
GFR calc Af Amer: 45 mL/min — ABNORMAL LOW (ref >60)
GFR calc non Af Amer: 38 mL/min — ABNORMAL LOW (ref >60)

## 2019-08-26 LAB — GLUCOSE, CAPILLARY
Glucose-Capillary: 117 mg/dL — ABNORMAL HIGH (ref 70–99)
Glucose-Capillary: 118 mg/dL — ABNORMAL HIGH (ref 70–99)

## 2019-08-26 SURGERY — RENAL ANGIOGRAPHY
Anesthesia: Moderate Sedation | Laterality: Left

## 2019-08-26 MED ORDER — HYDRALAZINE HCL 20 MG/ML IJ SOLN: 5.0000 mg | INTRAMUSCULAR | Status: DC | PRN

## 2019-08-26 MED ORDER — DIPHENHYDRAMINE HCL 50 MG/ML IJ SOLN: 50.0000 mg | Freq: Once | INTRAMUSCULAR | Status: DC | PRN

## 2019-08-26 MED ORDER — FENTANYL CITRATE (PF) 100 MCG/2ML IJ SOLN
INTRAMUSCULAR | Status: AC
  Filled 2019-08-26: qty 2

## 2019-08-26 MED ORDER — SODIUM CHLORIDE 0.9% FLUSH: 3.0000 mL | Freq: Two times a day (BID) | INTRAVENOUS | Status: DC

## 2019-08-26 MED ORDER — METHYLPREDNISOLONE SODIUM SUCC 125 MG IJ SOLR: 125.0000 mg | Freq: Once | INTRAMUSCULAR | Status: DC | PRN

## 2019-08-26 MED ORDER — SODIUM CHLORIDE 0.9 % IV SOLN
INTRAVENOUS | Status: DC
  Administered 2019-08-26: 08:00:00 via INTRAVENOUS

## 2019-08-26 MED ORDER — SODIUM CHLORIDE 0.9 % IV SOLN: INTRAVENOUS | Status: DC

## 2019-08-26 MED ORDER — VERAPAMIL HCL 2.5 MG/ML IV SOLN
INTRAVENOUS | Status: AC
  Filled 2019-08-26: qty 2

## 2019-08-26 MED ORDER — HEPARIN SODIUM (PORCINE) 1000 UNIT/ML IJ SOLN
INTRAMUSCULAR | Status: AC
  Filled 2019-08-26: qty 1

## 2019-08-26 MED ORDER — MIDAZOLAM HCL 2 MG/2ML IJ SOLN
INTRAMUSCULAR | Status: DC | PRN
  Administered 2019-08-26: 1 mg via INTRAVENOUS
  Administered 2019-08-26: 2 mg via INTRAVENOUS

## 2019-08-26 MED ORDER — VERAPAMIL HCL 2.5 MG/ML IV SOLN
INTRAVENOUS | Status: DC | PRN
  Administered 2019-08-26: 2.5 mg via INTRAVENOUS

## 2019-08-26 MED ORDER — ONDANSETRON HCL 4 MG/2ML IJ SOLN: 4.0000 mg | Freq: Four times a day (QID) | INTRAMUSCULAR | Status: DC | PRN

## 2019-08-26 MED ORDER — LABETALOL HCL 5 MG/ML IV SOLN: 10.0000 mg | INTRAVENOUS | Status: DC | PRN

## 2019-08-26 MED ORDER — FENTANYL CITRATE (PF) 100 MCG/2ML IJ SOLN
INTRAMUSCULAR | Status: DC | PRN
  Administered 2019-08-26 (×2): 50 ug via INTRAVENOUS

## 2019-08-26 MED ORDER — HEPARIN SODIUM (PORCINE) 1000 UNIT/ML IJ SOLN
INTRAMUSCULAR | Status: DC | PRN
  Administered 2019-08-26: 5000 [IU] via INTRAVENOUS

## 2019-08-26 MED ORDER — ACETAMINOPHEN 325 MG PO TABS: 650.0000 mg | ORAL_TABLET | ORAL | Status: DC | PRN

## 2019-08-26 MED ORDER — CEFAZOLIN SODIUM-DEXTROSE 2-4 GM/100ML-% IV SOLN
2.0000 g | Freq: Once | INTRAVENOUS | Status: AC
  Administered 2019-08-26: 2 g via INTRAVENOUS

## 2019-08-26 MED ORDER — MIDAZOLAM HCL 2 MG/ML PO SYRP: 8.0000 mg | ORAL_SOLUTION | Freq: Once | ORAL | Status: DC | PRN

## 2019-08-26 MED ORDER — FAMOTIDINE 20 MG PO TABS: 40.0000 mg | ORAL_TABLET | Freq: Once | ORAL | Status: DC | PRN

## 2019-08-26 MED ORDER — MIDAZOLAM HCL 5 MG/5ML IJ SOLN
INTRAMUSCULAR | Status: AC
  Filled 2019-08-26: qty 5

## 2019-08-26 MED ORDER — HYDROMORPHONE HCL 1 MG/ML IJ SOLN: 1.0000 mg | Freq: Once | INTRAMUSCULAR | Status: DC | PRN

## 2019-08-26 MED ORDER — IODIXANOL 320 MG/ML IV SOLN
INTRAVENOUS | Status: DC | PRN
  Administered 2019-08-26: 100 mL via INTRA_ARTERIAL

## 2019-08-26 MED ORDER — SODIUM CHLORIDE 0.9% FLUSH: 3.0000 mL | INTRAVENOUS | Status: DC | PRN

## 2019-08-26 MED ORDER — SODIUM CHLORIDE 0.9 % IV SOLN: 250.0000 mL | INTRAVENOUS | Status: DC | PRN

## 2019-08-26 SURGICAL SUPPLY — 25 items
BALLN MUSTANG 7X20X135 (BALLOONS) ×2
BALLOON MUSTANG 7X20X135 (BALLOONS) IMPLANT
CANNULA 5F STIFF (CANNULA) ×1 IMPLANT
CATH BEACON 5 .038 100 VERT TP (CATHETERS) ×1 IMPLANT
CATH PIG 70CM (CATHETERS) ×1 IMPLANT
CATH VS15FR (CATHETERS) ×1 IMPLANT
DEVICE PRESTO INFLATION (MISCELLANEOUS) ×2 IMPLANT
DEVICE RAD TR BAND REGULAR (VASCULAR PRODUCTS) ×1 IMPLANT
DEVICE STARCLOSE SE CLOSURE (Vascular Products) ×2 IMPLANT
DRAPE BRACHIAL (DRAPES) ×2 IMPLANT
GLIDEWIRE ADV .035X260CM (WIRE) ×1 IMPLANT
GLIDEWIRE ANGLED SS 035X260CM (WIRE) ×1 IMPLANT
PACK ANGIOGRAPHY (CUSTOM PROCEDURE TRAY) ×1 IMPLANT
SHEATH  FLEX ANSEL 8FRX45 (SHEATH) ×1
SHEATH BRITE TIP 5FRX11 (SHEATH) ×2 IMPLANT
SHEATH BRITE TIP 6FRX11 (SHEATH) ×1 IMPLANT
SHEATH FLEX ANSEL 8FRX45 (SHEATH) IMPLANT
SHEATH HALO 035 6FRX10 (SHEATH) ×1 IMPLANT
SHEATH SLENDER DEST 6FR 95 (SHEATH) ×1 IMPLANT
STENT LIFESTREAM 12X58X80 (Permanent Stent) ×1 IMPLANT
STENT LIFESTREAM 6X37X135 (Permanent Stent) ×1 IMPLANT
TOWEL OR 17X26 4PK STRL BLUE (TOWEL DISPOSABLE) ×1 IMPLANT
WIRE J 3MM .035X145CM (WIRE) ×1 IMPLANT
WIRE MAGIC TOR.035 180C (WIRE) ×1 IMPLANT
WIRE MAGIC TORQUE 260C (WIRE) ×1 IMPLANT

## 2019-08-26 NOTE — Op Note (Signed)
[facility]  Percutaneous Study/Intervention Procedural Note   Date of Surgery: 08/26/2019  Surgeon(s): [name], MD and Hortencia Pilar, MD, cosurgeons   Pre-operative Diagnosis: PAD with disabling claudication and possible early rest plane bilateral lower extremities with severe aortic stenosis, left renal artery stenosis  Post-operative diagnosis:  Same  Procedure(s) Performed:             1.  Ultrasound guidance for vascular access right femoral artery by Dr. Delana Meyer and of left femoral artery and left radial artery by [name]             2.  Catheter placement into the aorta from the right femoral approach by Dr. Delana Meyer, the right renal artery from the left femoral approach by [name], and the left renal artery from the left radial approach by [name]             3.  Aortogram and selective bilateral renal artery angiograms             4.   Aortic stent placement with a 12 mm diameter by 58 mm length lifestream stent primarily by Dr. Delana Meyer with [name] assisting             5.   Left renal artery stent placement with a 6 mm diameter by 38 mm length lifestream stent primarily by [name] with Dr. Delana Meyer assisting  6.  StarClose closure device bilateral femoral arteries  EBL: 25 cc  Contrast: 100 cc  Fluoro Time: 9.9 minutes  Moderate Conscious Sedation Time: approximately 60 minutes using 3 mg of Versed and 100 mcg of Fentanyl              Indications:  Patient is a 83 y.o.female with disabling claudication symptoms and a severe aortic stenosis at just below the level of the left renal artery. The patient has noninvasive study showing the stenosis as well as a CT scan.  She has previously had a left renal artery stent and there was some degree of stenosis in the stent but this would need to be extended and essentially snorkel more proximally to protect the left renal artery. The patient is brought in for angiography for further evaluation and potential  treatment.  Risks and benefits are discussed and informed consent is obtained.   Procedure:  The patient was identified and appropriate procedural time out was performed.  The patient was then placed supine on the table and prepped and draped in the usual sterile fashion. Moderate conscious sedation was administered during a face to face encounter with the patient throughout the procedure with my supervision of the RN administering medicines and monitoring the patient's vital signs, pulse oximetry, telemetry and mental status throughout from the start of the procedure until the patient was taken to the recovery room.  Dr. Delana Meyer used ultrasound and accessed the right femoral artery with a micropuncture needle.  A micropuncture wire and sheath were placed and then he upsized to a 6 [place] sheath.  I used ultrasound to visualize the left radial artery.  This was patent and of decent size and was accessed without difficulty with a micropuncture needle.  A micropuncture wire and sheath were then placed then upsized to a 5 French sheath in the left radial artery.  I then turned my attention to the left femoral artery.  Ultrasound was used to evaluate the left common femoral artery.  It was patent .  A digital ultrasound image was  acquired.  A Seldinger needle was used to access the left common femoral artery under direct ultrasound guidance and a permanent image was performed.  A 0.035 J wire was advanced without resistance and a 5Fr sheath was placed.  Pigtail catheter was placed into the aorta from the right femoral approach and an AP aortogram was performed. This demonstrated a high-grade aortic stenosis at and just below the level of the left renal artery.  The renal arteries were seen but were better seen on selective imaging.  The takeoff of the SMA was just above the level of the right renal artery.  The mid infrarenal aorta normalized and this had been seen on previous imaging as well.  I cannulated the right  renal artery from the left femoral approach to opacify the origin of the right renal artery both for localization to avoid injuring it with the aortic stent as well as to assess its patency.  This demonstrated mild right renal artery stenosis of less than 50% with a brisk nephrogram.  The right renal artery origin was at least a centimeter superior to the left renal artery origin.  Using a Kumpe catheter and advantage wire and navigated from the left radial artery down into the distal descending thoracic aorta.  The left renal artery stent was then easily cannulated and selective left renal imaging was then performed.  There was a significant stenosis in the 65 to 70% range at the origin of the left renal artery within the previously placed stent and some mild narrowing just distal to the previously placed stent.  Good nephrogram was still seen.  At this point, Dr. Delana Meyer upsized to an 8 French sheath in the right groin and I placed a Magic torque wire out the left renal artery and a 95 cm 6 French sheath was extended from the left radial artery and used to selectively cannulate the left renal artery with ease.  I selected a 6 mm diameter by 38 mm length lifestream stent for the left renal artery to snorkel up and above the aortic stent that would be placed and to treat the stenosis in the left renal artery stent that was already present.  Dr. Delana Meyer selected a 12 mm diameter by 58 mm length lifestream stent and based off of the location of the V S1 catheter in the right renal artery plan deployment.  The V S1 catheter was removed and the stent was deployed at the base of the right renal artery extending into the mid infrarenal aorta where the vessel normalized.  The balloon was inflated to 10 atm.  I then pulled the sheath back and deployed the 6 mm diameter by 38 mm length lifestream stent in the left renal artery extending this about 1 cm above the aortic stent.  This was then postdilated with a 7 mm balloon in  the midsegment at the area of stenosis.  Completion imaging both through the radial sheath and the left renal artery and the right femoral sheath in the aorta showed both stents to be widely patent with less than 10% residual stenosis, brisk nephrograms and good flow was seen in both renal arteries, and the SMA remained patent.  The left radial sheath was then removed and manual pressure was held at the left wrist for 20 minutes.  The sheath was removed and StarClose closure device was deployed in the left and right femoral artery with excellent hemostatic result. The patient was taken to the recovery room in stable condition having  tolerated the procedure well.  Findings:               Aortogram:  This demonstrated a high-grade aortic stenosis at and just below the level of the left renal artery.  The renal arteries were seen but were better seen on selective imaging.  The takeoff of the SMA was just above the level of the right renal artery.  The mid infrarenal aorta normalized and this had been seen on previous imaging as well.             Right Renal Artery: Mild right renal artery stenosis of less than 50% with a brisk nephrogram.  The right renal artery origin was at least a centimeter superior to the left renal artery origin.  Left Renal Artery: There was a significant stenosis in the 65 to 70% range at the origin of the left renal artery within the previously placed stent and some mild narrowing just distal to the previously placed stent.  Good nephrogram was still seen   Disposition: Patient was taken to the recovery room in stable condition having tolerated the procedure well.  Complications: None  [name] 08/26/2019 10:00 AM   This note was created with Dragon Medical transcription system. Any errors in dictation are purely unintentional.

## 2019-08-26 NOTE — H&P (Signed)
[name] Admission History & Physical  MRN : [id number]  Tracey [name] is a 83 y.o. (06-21-1936) female who presents with chief complaint of No chief complaint on file. Marland Kitchen  History of Present Illness: Patient presents with disabling claudication from an aortic stenosis just below the level of the left renal artery.  We have planned repair of this and was delayed several weeks due to logistical issues.  Patient is here today to have this repaired.  Continues to have very short distance claudication and pain that wakes her from sleep.  No new ulceration or infection.  Current Facility-Administered Medications  Medication Dose Route Frequency Provider Last Rate Last Dose  . 0.9 %  sodium chloride infusion   Intravenous Continuous Kris Hartmann, NP      . ceFAZolin (ANCEF) IVPB 2g/100 mL premix  2 g Intravenous Once Kris Hartmann, NP      . diphenhydrAMINE (BENADRYL) injection 50 mg  50 mg Intravenous Once PRN Kris Hartmann, NP      . famotidine (PEPCID) tablet 40 mg  40 mg Oral Once PRN Kris Hartmann, NP      . HYDROmorphone (DILAUDID) injection 1 mg  1 mg Intravenous Once PRN Eulogio Ditch E, NP      . methylPREDNISolone sodium succinate (SOLU-MEDROL) 125 mg/2 mL injection 125 mg  125 mg Intravenous Once PRN Eulogio Ditch E, NP      . midazolam (VERSED) 2 MG/ML syrup 8 mg  8 mg Oral Once PRN Kris Hartmann, NP      . ondansetron (ZOFRAN) injection 4 mg  4 mg Intravenous Q6H PRN Kris Hartmann, NP        Past Medical History:  Diagnosis Date  . Anxiety   . Arthritis   . Atrial fibrillation ([place])    history  . Coronary artery disease   . Diabetes mellitus    non insulin dependent  . GERD (gastroesophageal reflux disease)   . Heart murmur   . Hyperlipidemia   . Hypertension   . Peripheral vascular disease ([place])   . UTI (urinary tract infection)     Past Surgical History:  Procedure Laterality Date  . APPENDECTOMY    . BREAST BIOPSY Left     20 years ago  . BREAST BIOPSY Right 2008  . BREAST EXCISIONAL BIOPSY     rt 2008 lt "20 years ago"  . CARDIAC CATHETERIZATION    . CATARACT EXTRACTION W/PHACO Left 08/15/2015   Procedure: CATARACT EXTRACTION PHACO AND INTRAOCULAR LENS PLACEMENT (IOC);  Surgeon: Estill Cotta, MD;  Location: [facility] ORS;  Service: Ophthalmology;  Laterality: Left;  [place] 01:28 AP% 26.4 CDE 35.73 fluid pack # [id number]  . COLONOSCOPY WITH PROPOFOL N/A 01/16/2016   Procedure: COLONOSCOPY WITH PROPOFOL;  Surgeon: Manya Silvas, MD;  Location[facility] ENDOSCOPY;  Service: Endoscopy;  Laterality: N/A;  . CORONARY ANGIOPLASTY WITH STENT PLACEMENT    . CORONARY ARTERY BYPASS GRAFT     with LIMA  to the LAD, SVG to OM1, SVG to PDA  . ESOPHAGOGASTRODUODENOSCOPY (EGD) WITH PROPOFOL N/A 01/16/2016   Procedure: ESOPHAGOGASTRODUODENOSCOPY (EGD) WITH PROPOFOL;  Surgeon: Manya Silvas, MD;  Location[facility] ENDOSCOPY;  Service: Endoscopy;  Laterality: N/A;  . LOWER EXTREMITY ANGIOGRAPHY N/A 07/08/2019   Procedure: Abdominal Aortagraphy;  Surgeon: Algernon Huxley, MD;  Location: [place] CV LAB;  Service: Cardiovascular;  Laterality: N/A;  . RENAL ARTERY STENT     left, By Dr  Hearn  . REPLACEMENT TOTAL KNEE  03/25/2013   right  . TONSILLECTOMY      Social History       Tobacco Use  . Smoking status: Never Smoker  . Smokeless tobacco: Never Used  Substance Use Topics  . Alcohol use: No  . Drug use: No    Family History      Family History  Problem Relation Age of Onset  . Heart disease Father   . Other Brother        open heart surgery  . Cancer Brother   . Other Brother        open heart surgery  . Other Brother        open heart surgery  . Hyperlipidemia Daughter   . Breast cancer Neg Hx   . Kidney cancer Neg Hx   . Bladder Cancer Neg Hx          Allergies  Allergen Reactions  . Ciprofloxacin Other (See Comments)    Unknown  . Codeine Nausea Only  . Erythromycin Nausea And  Vomiting  . Tramadol Other (See Comments)    Unknown     REVIEW OF SYSTEMS (Negative unless checked)  Constitutional: [] ?Weight loss  [] ?Fever  [] ?Chills Cardiac: [] ?Chest pain   [] ?Chest pressure   [] ?Palpitations   [] ?Shortness of breath when laying flat   [] ?Shortness of breath at rest   [] ?Shortness of breath with exertion. Vascular:  [x] ?Pain in legs with walking   [] ?Pain in legs at rest   [] ?Pain in legs when laying flat   [x] ?Claudication   [] ?Pain in feet when walking  [] ?Pain in feet at rest  [] ?Pain in feet when laying flat   [] ?History of DVT   [] ?Phlebitis   [] ?Swelling in legs   [] ?Varicose veins   [] ?Non-healing ulcers Pulmonary:   [] ?Uses home oxygen   [] ?Productive cough   [] ?Hemoptysis   [] ?Wheeze  [] ?COPD   [] ?Asthma Neurologic:  [] ?Dizziness  [] ?Blackouts   [] ?Seizures   [] ?History of stroke   [] ?History of TIA  [] ?Aphasia   [] ?Temporary blindness   [] ?Dysphagia   [] ?Weakness or numbness in arms   [] ?Weakness or numbness in legs Musculoskeletal:  [x] ?Arthritis   [] ?Joint swelling   [] ?Joint pain   [] ?Low back pain Hematologic:  [] ?Easy bruising  [] ?Easy bleeding   [] ?Hypercoagulable state   [] ?Anemic   Gastrointestinal:  [] ?Blood in stool   [] ?Vomiting blood  [x] ?Gastroesophageal reflux/heartburn   [] ?Abdominal pain Genitourinary:  [] ?Chronic kidney disease   [] ?Difficult urination  [] ?Frequent urination  [] ?Burning with urination   [] ?Hematuria Skin:  [] ?Rashes   [] ?Ulcers   [] ?Wounds Psychological:  [x] ?History of anxiety   [] ? History of major depression.     Physical Examination  Vitals:   08/26/19 0725  BP: (!) 139/54  Pulse: 72  Resp: 17  Temp: 97.6 F (36.4 C)  TempSrc: Oral  SpO2: 98%  Weight: 57.6 kg  Height: 5\' 3"  (1.6 m)   Body mass index is 22.5 kg/m. Gen: WD/WN, NAD.  Appears younger than stated age Head: [place]/AT, No temporalis wasting.  Ear/Nose/Throat: Hearing grossly intact, nares w/o erythema or drainage, oropharynx w/o  Erythema/Exudate,  Eyes: Conjunctiva clear, sclera non-icteric Neck: Trachea midline.  No JVD.  Pulmonary:  Good air movement, respirations not labored, no use of accessory muscles.  Cardiac: RRR, normal S1, S2. Vascular:  Vessel Right Left  Radial Palpable Palpable  PT 1+ Palpable 1+ Palpable  DP 1+ Palpable 1+ Palpable   Gastrointestinal: soft, non-tender/non-distended. No guarding/reflex.  Musculoskeletal: M/S 5/5 throughout.  Extremities without ischemic changes.  No deformity or atrophy.  Neurologic: Sensation grossly intact in extremities.  Symmetrical.  Speech is fluent. Motor exam as listed above. Psychiatric: Judgment intact, Mood & affect appropriate for pt's clinical situation. Dermatologic: No rashes or ulcers noted.  No cellulitis or open wounds.      CBC Lab Results  Component Value Date   WBC 6.6 03/05/2019   HGB 10.2 (L) 03/05/2019   HCT 31.3 (L) 03/05/2019   MCV 80.3 03/05/2019   PLT 193.0 03/05/2019    BMET    Component Value Date/Time   NA 141 03/05/2019 0916   NA 137 03/27/2013 0545   K 4.5 03/05/2019 0916   K 3.9 03/27/2013 0545   CL 106 03/05/2019 0916   CL 103 03/27/2013 0545   CO2 27 03/05/2019 0916   CO2 27 03/27/2013 0545   GLUCOSE 139 (H) 03/05/2019 0916   GLUCOSE 122 (H) 03/27/2013 0545   BUN 31 (H) 08/26/2019 0728   BUN 10 03/27/2013 0545   CREATININE 1.30 (H) 08/26/2019 0728   CREATININE 1.31 (H) 02/01/2017 1547   CALCIUM 10.6 (H) 03/05/2019 0916   CALCIUM 8.9 03/27/2013 0545   GFRNONAA 38 (L) 08/26/2019 0728   GFRNONAA >60 03/27/2013 0545   GFRAA 45 (L) 08/26/2019 0728   GFRAA >60 03/27/2013 0545   Estimated Creatinine Clearance: 28.1 mL/min (A) (by C-G formula based on SCr of 1.3 mg/dL (H)).  COAG Lab Results  Component Value Date   INR 1.01 10/06/2018   INR 1.0 03/09/2013   INR 0.9 10/07/2011    Radiology No results found.   Assessment/Plan Mixed hyperlipidemia lipid control important  in reducing the progression of atherosclerotic disease. Continue statin therapy   Type 2 diabetes mellitus with stage 2 chronic kidney disease, without long-term current use of insulin (HCC) blood glucose control important in reducing the progression of atherosclerotic disease. Also, involved in wound healing. On appropriate medications.   Essential hypertension, benign blood pressure control important in reducing the progression of atherosclerotic disease. On appropriate oral medications.  PAD (peripheral artery disease) ([place]) Very short distance claudication.  The plan is for an aortic stent with concomitant left renal artery stent to address the lesion.  This would be both from a femoral approach and from a radial approach.  Patient understands the risk including risk of renal insufficiency but her symptoms are disabling and she desires to proceed.  [name], MD  08/26/2019 8:09 AM

## 2019-08-26 NOTE — Op Note (Signed)
[facility]  Percutaneous Study/Intervention Procedural Note   Date of Surgery: 08/26/2019,9:50 AM  Surgeon:Schnier, Dolores Lory and [name] S  Pre-operative Diagnosis: Atherosclerotic occlusive disease bilateral lower extremities with rest pain and lifestyle limiting claudication; recurrent left renal artery stenosis; renovascular hypertension  Post-operative diagnosis:  Same  Procedure(s) Performed:  1.  Intro catheter into aorta right common femoral artery approach  2.  Percutaneous transluminal angioplasty and stent placement pararenal aorta  3.  Intro catheter into right renal artery left common femoral artery approach   4.  Selective angiography right and left renal arteries  5.  Intro catheter into left renal artery left radial artery approach  6.  Percutaneous transluminal angioplasty and stent placement left renal artery  7.  Star close closure of the right and left common femoral arteries  Anesthesia: Conscious sedation was administered by the interventional radiology RN under my direct supervision. IV Versed plus fentanyl were utilized. Continuous ECG, pulse oximetry and blood pressure was monitored throughout the entire procedure. Conscious sedation was administered for a total of 75 minutes.  Sheath: 8 French sheath right common femoral artery retrograde; 5 French sheath left common femoral artery retrograde; 6 French Terumo sheath left radial artery  Contrast: 60 cc   Fluoroscopy Time: Approximately 10 minutes  Indications: Patient is well-known to the practice.  Previous noninvasive studies as well as angiography of demonstrated recurrent left renal artery stenosis in association with greater than 80% pararenal aortic stenosis.  She is having rest pain symptoms as well is lifestyle limitations.  Her hypertension has been difficult to control.  Given these factors she is undergoing treatment of both her aortic stenosis as well as her recurrent left  renal artery stenosis.  Risks and benefits of been reviewed all questions have answered patient has agreed to proceed.  Procedure:  Tracey [name] a 83 y.o. female who was identified and appropriate procedural time out was performed.  The patient was then placed supine on the table and both groins as well as the left arm are prepped and draped in the usual sterile fashion.    Co-surgeons are required because this is a complicated procedure with work being performed simultaneously from both the patient's right left sides.  This also expedites the procedure making a shorter operative time reducing complications and improving patient safety.  With myself working on the patient's right and [name] working on the patient's left we simultaneously accessed the left radial artery as well as the right common femoral artery in the following fashion.   Ultrasound was used to evaluate the right common femoral artery.  It was echolucent and pulsatile indicating it is patent .  An ultrasound image was acquired for the permanent record.  A micropuncture needle was used to access the right common femoral artery under direct ultrasound guidance.  The microwire was then advanced under fluoroscopic guidance without difficulty followed by the micro-sheath.  A 0.035 J wire was advanced without resistance and a 6 French sheath was placed.  At the same time [name] is working with the ultrasound on the left radial artery.  Left radial artery is pulsatile and echolucent.  Indications patency.  Images recorded for the permanent record.  Initially a micropuncture needle is inserted followed by microwire micro sheath is then inserted.  A 6 French sheath was then inserted.  5000 units of heparin was given and allowed to circulate.  Again working from the right groin I advanced a Magic torque  wire and then a pigtail catheter which is positioned at approximately the level of the celiac artery.  [name] negotiated a wire and then a  catheter down into the left renal artery.  A 90 cm Terumo sheath is then advanced over the wire and positioned with the tip of the sheath within the left renal artery.  Hand-injection of contrast was then used to visualize the left renal artery by selective injection.  Given the short distance between the right renal artery and the left renal artery we felt it imperative to mark the right renal artery and prevent inadvertent loss of the right side.  To do this the ultrasound was returned to the field and [name] visualized the left common femoral artery.  It was echolucent pulsatile.  And this indicates patency.  Image was recorded for the permanent record.  Using a Seldinger needle he accessed the common femoral artery on the left J-wire was advanced followed by 5 [place] sheath.  He then used a Glidewire and a VS 1 catheter to select the right renal artery.  Hand-injection of contrast demonstrated patency of the right renal artery but also marked its origin.  V S1 catheter is left in situ.  Next I upsized the right common femoral sheath to an 8 [place].  At this point a 12 x 58 lifestream stent was advanced up the 8 French sheath from the right groin the VS 1 catheter is left in situ to mark the right renal artery and a 6 x 36 lifestream stent is advanced from the left wrist and positioned in the left renal artery extending upward into the aorta.  Once again injection of the V S1 catheter was utilized verifying the aortic stent was distal to the right renal artery origin.  The V S1 catheter was then removed over wire.  The aortic stent was then deployed to 10 atm for 30 seconds.  Next the left wrist sheath was pulled back under sheathing the lifestream stent and this was deployed to 14 atm for approximately 30 seconds.  Follow-up imaging demonstrated significant residual stenosis and a 7 mm x 20 mm Ultraverse balloon was advanced across the origin of the left renal artery and inflated to 14 atm for  approximately 30 seconds.  Follow-up imaging from the proximal aorta now demonstrates wide patency of both stents with rapid flow of contrast and preservation of the right renal artery.  At this point we elected to terminate the procedure RAO view of the right groin was obtained and a Star close device deployed successfully.  LAO view of the left groin was obtained and a Star close device deployed successfully.  Attempts at a TR band on the left radial artery were not successful and manual pressure was held.  Findings:   Aortogram: The abdominal aorta is opacified with a bolus injection of contrast.  There is diffuse disease noted with a greater than 90% stenosis at the level of the left renal artery.  This stenosis extends over a distance of approximately 3 cm.  Following angioplasty and stent placement there is less than 10% residual stenosis  Right renal artery: The right renal artery demonstrates diffuse disease but there are no hemodynamically significant stenoses.  Lourena Simmonds is patent.  Following deployment of the aortic stent the stent is deployed distal to the ostia there is no impingement of the right renal artery with successful aortic stent placement  Left renal artery: Previously placed stent is noted there is a greater  than 70% stenosis associated with it.  Following angioplasty and stent placement and then postdilatation to 7 mm there is now less than 10% residual stenosis.  There is wide patency of both renal arteries as well as the aortic stent at conclusion.    Disposition: Patient was taken to the recovery room in stable condition having tolerated the procedure well.  Belenda Cruise Schnier 08/26/2019,9:50 AM

## 2019-08-27 ENCOUNTER — Encounter: Payer: Self-pay | Admitting: Cardiology

## 2019-08-28 ENCOUNTER — Encounter (INDEPENDENT_AMBULATORY_CARE_PROVIDER_SITE_OTHER): Payer: Self-pay

## 2019-08-28 ENCOUNTER — Other Ambulatory Visit (INDEPENDENT_AMBULATORY_CARE_PROVIDER_SITE_OTHER): Payer: Self-pay | Admitting: Nurse Practitioner

## 2019-08-28 ENCOUNTER — Telehealth (INDEPENDENT_AMBULATORY_CARE_PROVIDER_SITE_OTHER): Payer: Self-pay

## 2019-08-28 NOTE — Telephone Encounter (Signed)
I spoke with the patient and she stated that she not able to hold her food down and nausea with Tramadol. Eulogio Ditch NP recommended that she could call in something for pain but the medicines do have codeine in them and this is on the allergy list. If the patient would like a pain medication to be called in she will call in something to help with the nausea. I informed the patient with medical information and she stated that she will continue with taking Tylenol 500mg  two tablets every six hours as needed and will call if the pain continues.

## 2019-08-28 NOTE — Telephone Encounter (Signed)
We can give the patient some tramadol because usually this procedure wont cause extensive pain, sometimes discomfort.  However the patient has an allergy listed to tramadol but there is an unknown effect.  Can you see if she gets nauseated or what happens when she takes it.  If she is unsure we will try that and see how she does.

## 2019-09-18 DIAGNOSIS — R55 Syncope and collapse: Secondary | ICD-10-CM

## 2019-09-18 HISTORY — DX: Syncope and collapse: R55

## 2019-09-24 ENCOUNTER — Other Ambulatory Visit: Payer: Self-pay

## 2019-09-24 ENCOUNTER — Other Ambulatory Visit: Payer: Self-pay | Admitting: *Deleted

## 2019-09-24 ENCOUNTER — Ambulatory Visit: Payer: Medicare HMO | Admitting: Obstetrics and Gynecology

## 2019-09-24 ENCOUNTER — Other Ambulatory Visit: Payer: Self-pay | Admitting: Cardiovascular Disease

## 2019-09-24 ENCOUNTER — Encounter: Payer: Self-pay | Admitting: Obstetrics and Gynecology

## 2019-09-24 VITALS — BP 145/63 | HR 81 | Ht 63.0 in | Wt 125.8 lb

## 2019-09-24 DIAGNOSIS — N952 Postmenopausal atrophic vaginitis: Secondary | ICD-10-CM | POA: Diagnosis not present

## 2019-09-24 DIAGNOSIS — R102 Pelvic and perineal pain: Secondary | ICD-10-CM

## 2019-09-24 DIAGNOSIS — N816 Rectocele: Secondary | ICD-10-CM | POA: Diagnosis not present

## 2019-09-24 DIAGNOSIS — R399 Unspecified symptoms and signs involving the genitourinary system: Secondary | ICD-10-CM | POA: Diagnosis not present

## 2019-09-24 LAB — POCT URINALYSIS DIPSTICK
Bilirubin, UA: NEGATIVE
Glucose, UA: NEGATIVE
Ketones, UA: NEGATIVE
Nitrite, UA: POSITIVE
Protein, UA: POSITIVE — AB
Spec Grav, UA: >=1.03 — AB (ref 1.010–1.025)
Urobilinogen, UA: 0.2 E.U./dL
pH, UA: 6 (ref 5.0–8.0)

## 2019-09-24 MED ORDER — NITROFURANTOIN MONOHYD MACRO 100 MG PO CAPS: 100.0000 mg | ORAL_CAPSULE | Freq: Two times a day (BID) | ORAL | 0 refills | Status: DC

## 2019-09-24 MED ORDER — CLOPIDOGREL BISULFATE 75 MG PO TABS: 75.0000 mg | ORAL_TABLET | Freq: Every day | ORAL | 0 refills | Status: DC

## 2019-09-24 NOTE — Progress Notes (Signed)
Pt present for bladder issues. Pt stated having a lot of pressure and pain when she urinate. Pt also stated frequent urination throughout he day and bladder leaking.

## 2019-09-24 NOTE — Patient Instructions (Signed)
Atrophic Vaginitis  Atrophic vaginitis is a condition in which the tissues that line the vagina become dry and thin. This condition is most common in women who have stopped having regular menstrual periods (are in menopause). This usually starts when a woman is 45-82 years old. That is the time when a woman's estrogen levels begin to drop (decrease). Estrogen is a female hormone. It helps to keep the tissues of the vagina moist. It stimulates the vagina to produce a clear fluid that lubricates the vagina for sexual intercourse. This fluid also protects the vagina from infection. Lack of estrogen can cause the lining of the vagina to get thinner and dryer. The vagina may also shrink in size. It may become less elastic. Atrophic vaginitis tends to get worse over time as a woman's estrogen level drops. What are the causes? This condition is caused by the normal drop in estrogen that happens around the time of menopause. What increases the risk? Certain conditions or situations may lower a woman's estrogen level, leading to a higher risk for atrophic vaginitis. You are more likely to develop this condition if:  You are taking medicines that block estrogen.  You have had your ovaries removed.  You are being treated for cancer with X-ray (radiation) or medicines (chemotherapy).  You have given birth or are breastfeeding.  You are older than age 50.  You smoke. What are the signs or symptoms? Symptoms of this condition include:  Pain, soreness, or bleeding during sexual intercourse (dyspareunia).  Vaginal burning, irritation, or itching.  Pain or bleeding when a speculum is used in a vaginal exam (pelvic exam).  Having burning pain when passing urine.  Vaginal discharge that is brown or yellow. In some cases, there are no symptoms. How is this diagnosed? This condition is diagnosed by taking a medical history and doing a physical exam. This will include a pelvic exam that checks the  vaginal tissues. Though rare, you may also have other tests, including:  A urine test.  A test that checks the acid balance in your vagina (acid balance test). How is this treated? Treatment for this condition depends on how severe your symptoms are. Treatment may include:  Using an over-the-counter vaginal lubricant before sex.  Using a long-acting vaginal moisturizer.  Using low-dose vaginal estrogen for moderate to severe symptoms that do not respond to other treatments. Options include creams, tablets, and inserts (vaginal rings). Before you use a vaginal estrogen, tell your health care provider if you have a history of: ? Breast cancer. ? Endometrial cancer. ? Blood clots. If you are not sexually active and your symptoms are very mild, you may not need treatment. Follow these instructions at home: Medicines  Take over-the-counter and prescription medicines only as told by your health care provider. Do not use herbal or alternative medicines unless your health care provider says that you can.  Use over-the-counter creams, lubricants, or moisturizers for dryness only as directed by your health care provider. General instructions  If your atrophic vaginitis is caused by menopause, discuss all of your menopause symptoms and treatment options with your health care provider.  Do not douche.  Do not use products that can make your vagina dry. These include: ? Scented feminine sprays. ? Scented tampons. ? Scented soaps.  Vaginal intercourse can help to improve blood flow and elasticity of vaginal tissue. If it hurts to have sex, try using a lubricant or moisturizer just before having intercourse. Contact a health care provider if:    Your discharge looks different than normal.  Your vagina has an unusual smell.  You have new symptoms.  Your symptoms do not improve with treatment.  Your symptoms get worse. Summary  Atrophic vaginitis is a condition in which the tissues that  line the vagina become dry and thin. It is most common in women who have stopped having regular menstrual periods (are in menopause).  Treatment options include using vaginal lubricants and low-dose vaginal estrogen.  Contact a health care provider if your vagina has an unusual smell, or if your symptoms get worse or do not improve after treatment. This information is not intended to replace advice given to you by your health care provider. Make sure you discuss any questions you have with your health care provider. Document Revised: 08/16/2017 Document Reviewed: 05/30/2017 Elsevier Patient Education  [employer].   Urinary Tract Infection, Adult A urinary tract infection (UTI) is an infection of any part of the urinary tract. The urinary tract includes:  The kidneys.  The ureters.  The bladder.  The urethra. These organs make, store, and get rid of pee (urine) in the body. What are the causes? This is caused by germs (bacteria) in your genital area. These germs grow and cause swelling (inflammation) of your urinary tract. What increases the risk? You are more likely to develop this condition if:  You have a small, thin tube (catheter) to drain pee.  You cannot control when you pee or poop (incontinence).  You are female, and: ? You use these methods to prevent pregnancy:  A medicine that kills sperm (spermicide).  A device that blocks sperm (diaphragm). ? You have low levels of a female hormone (estrogen). ? You are pregnant.  You have genes that add to your risk.  You are sexually active.  You take antibiotic medicines.  You have trouble peeing because of: ? A prostate that is bigger than normal, if you are female. ? A blockage in the part of your body that drains pee from the bladder (urethra). ? A kidney stone. ? A nerve condition that affects your bladder (neurogenic bladder). ? Not getting enough to drink. ? Not peeing often enough.  You have other  conditions, such as: ? Diabetes. ? A weak disease-fighting system (immune system). ? Sickle cell disease. ? Gout. ? Injury of the spine. What are the signs or symptoms? Symptoms of this condition include:  Needing to pee right away (urgently).  Peeing often.  Peeing small amounts often.  Pain or burning when peeing.  Blood in the pee.  Pee that smells bad or not like normal.  Trouble peeing.  Pee that is cloudy.  Fluid coming from the vagina, if you are female.  Pain in the belly or lower back. Other symptoms include:  Throwing up (vomiting).  No urge to eat.  Feeling mixed up (confused).  Being tired and grouchy (irritable).  A fever.  Watery poop (diarrhea). How is this treated? This condition may be treated with:  Antibiotic medicine.  Other medicines.  Drinking enough water. Follow these instructions at home:  Medicines  Take over-the-counter and prescription medicines only as told by your doctor.  If you were prescribed an antibiotic medicine, take it as told by your doctor. Do not stop taking it even if you start to feel better. General instructions  Make sure you: ? Pee until your bladder is empty. ? Do not hold pee for a long time. ? Empty your bladder after sex. ? Wipe  from front to back after pooping if you are a female. Use each tissue one time when you wipe.  Drink enough fluid to keep your pee pale yellow.  Keep all follow-up visits as told by your doctor. This is important. Contact a doctor if:  You do not get better after 1-2 days.  Your symptoms go away and then come back. Get help right away if:  You have very bad back pain.  You have very bad pain in your lower belly.  You have a fever.  You are sick to your stomach (nauseous).  You are throwing up. Summary  A urinary tract infection (UTI) is an infection of any part of the urinary tract.  This condition is caused by germs in your genital area.  There are many  risk factors for a UTI. These include having a small, thin tube to drain pee and not being able to control when you pee or poop.  Treatment includes antibiotic medicines for germs.  Drink enough fluid to keep your pee pale yellow. This information is not intended to replace advice given to you by your health care provider. Make sure you discuss any questions you have with your health care provider. Document Revised: 08/21/2018 Document Reviewed: 03/13/2018 Elsevier Patient Education  2020 [employer].

## 2019-09-24 NOTE — Progress Notes (Signed)
GYNECOLOGY PROGRESS NOTE  Subjective:    Patient ID: Tracey [name], female    DOB: 10-08-1935, 84 y.o.   MRN: [id number]  HPI  Patient is a 84 y.o. G68P4 female who presents for complaints of vaginal pain (hurting/throbbing), has been ongoing for 3 years. Notes that she has a sort of "skin tag" in the vagina, wonders if this has anything to do with it.  Has mentioned to her previous provider, Tracey [name], CNM, but notes that she was advised that nothing was wrong.  Patient reports that she sometimes feels that something is low in her vagina.  Tracey [name] also complains of urinary symptoms over the past several days.  Has noted discomfort with urination. Denies hematuria.     The following portions of the patient's history were reviewed and updated as appropriate: She  has a past medical history of Anxiety, Arthritis, Atrial fibrillation ([place]), Coronary artery disease, Diabetes mellitus, GERD (gastroesophageal reflux disease), Heart murmur, Hyperlipidemia, Hypertension, Peripheral vascular disease ([place]), and UTI (urinary tract infection).   She  has a past surgical history that includes Cardiac catheterization; Coronary artery bypass graft; Appendectomy; Tonsillectomy; Renal artery stent; Replacement total knee (03/25/2013); Coronary angioplasty with stent; Cataract extraction w/PHACO (Left, 08/15/2015); Colonoscopy with propofol (N/A, 01/16/2016); Esophagogastroduodenoscopy (egd) with propofol (N/A, 01/16/2016); Breast biopsy (Left); Breast biopsy (Right, 2008); Breast excisional biopsy; Lower Extremity Angiography (N/A, 07/08/2019); and RENAL ANGIOGRAPHY (Left, 08/26/2019).   She  reports that she has never smoked. She has never used smokeless tobacco. She reports that she does not drink alcohol or use drugs.   She has a current medication list which includes the following prescription(s): acetaminophen, aspirin, carvedilol, clopidogrel, glipizide, glucose blood, lisinopril, metformin, nitroglycerin,  onetouch delica lancets 0000000, pantoprazole, align, rosuvastatin, and nitrofurantoin (macrocrystal-monohydrate).   She is allergic to ciprofloxacin; codeine; erythromycin; and tramadol..  Review of Systems Pertinent items noted in HPI and remainder of comprehensive ROS otherwise negative.   Objective:   Blood pressure (!) 145/63, pulse 81, height 5\' 3"  (1.6 m), weight 125 lb 12.8 oz (57.1 kg). General appearance: alert and no distress Abdomen: soft, non-tender; bowel sounds normal; no masses,  no organomegaly Pelvic: external genitalia normal, rectovaginal septum normal.  Vagina with mild to moderate vaginal atrophy. No vaginal discharge. Rectocele present.  Cervix atrophic, no lesions and no motion tenderness.  Uterus mobile, nontender, normal shape and size.  Adnexae non-palpable, nontender bilaterally.    Labs:  Results for orders placed or performed in visit on 09/24/19  POCT Urinalysis Dipstick  Result Value Ref Range   Color, UA yellow    Clarity, UA cloudy    Glucose, UA Negative Negative   Bilirubin, UA neg    Ketones, UA neg    Spec Grav, UA >=1.030 (A) 1.010 - 1.025   Blood, UA 3+    pH, UA 6.0 5.0 - 8.0   Protein, UA Positive (A) Negative   Urobilinogen, UA 0.2 0.2 or 1.0 E.U./dL   Nitrite, UA positive    Leukocytes, UA Moderate (2+) (A) Negative   Appearance yellow;cloudy    Odor      Assessment:   1. Vaginal pain   2. UTI symptoms   3. Vaginal atrophy   4. Rectocele     Plan:   1. Vaginal pain - discomfort may be secondary to rectocele, as well as vaginal atrophy that is noted.  Discussion had with patient regarding options for management of atrophy, including vaginal moisturizers, or treatment with local hormone therapy (  vaginal estrogen ring, tablet, cream, or vaginal testosterone therapy).  Given samples of Premarin cream.  Also discussed option of management of her rectocele with pessary vs surgical repair.  Patient unsure, will think about pessary. Info  given. Patient to f/u in 2 weeks to discuss further and reassess atrophy.  2. UTI noted on UA today, positive nitrites. Will prescribe Macrobid.  Urine culture ordered.    A total of 15 minutes were spent face-to-face with the patient during this encounter and over half of that time dealt with counseling and coordination of care.   Rubie Maid, MD [facility]

## 2019-09-26 LAB — URINE CULTURE

## 2019-09-27 ENCOUNTER — Encounter: Payer: Self-pay | Admitting: Obstetrics and Gynecology

## 2019-09-27 DIAGNOSIS — R399 Unspecified symptoms and signs involving the genitourinary system: Secondary | ICD-10-CM | POA: Insufficient documentation

## 2019-09-27 DIAGNOSIS — N952 Postmenopausal atrophic vaginitis: Secondary | ICD-10-CM | POA: Insufficient documentation

## 2019-09-27 DIAGNOSIS — N816 Rectocele: Secondary | ICD-10-CM | POA: Insufficient documentation

## 2019-09-28 ENCOUNTER — Other Ambulatory Visit (INDEPENDENT_AMBULATORY_CARE_PROVIDER_SITE_OTHER): Payer: Self-pay | Admitting: Vascular Surgery

## 2019-09-28 DIAGNOSIS — I700 Atherosclerosis of aorta: Secondary | ICD-10-CM

## 2019-09-28 DIAGNOSIS — I70219 Atherosclerosis of native arteries of extremities with intermittent claudication, unspecified extremity: Secondary | ICD-10-CM

## 2019-09-28 DIAGNOSIS — Z95820 Peripheral vascular angioplasty status with implants and grafts: Secondary | ICD-10-CM

## 2019-09-29 ENCOUNTER — Other Ambulatory Visit: Payer: Self-pay

## 2019-09-29 ENCOUNTER — Encounter (INDEPENDENT_AMBULATORY_CARE_PROVIDER_SITE_OTHER): Payer: Self-pay | Admitting: Vascular Surgery

## 2019-09-29 ENCOUNTER — Ambulatory Visit (INDEPENDENT_AMBULATORY_CARE_PROVIDER_SITE_OTHER): Payer: Medicare HMO

## 2019-09-29 ENCOUNTER — Ambulatory Visit (INDEPENDENT_AMBULATORY_CARE_PROVIDER_SITE_OTHER): Payer: Medicare HMO | Admitting: Vascular Surgery

## 2019-09-29 VITALS — BP 134/68 | HR 66 | Resp 15 | Wt 124.0 lb

## 2019-09-29 DIAGNOSIS — N182 Chronic kidney disease, stage 2 (mild): Secondary | ICD-10-CM | POA: Diagnosis not present

## 2019-09-29 DIAGNOSIS — I70219 Atherosclerosis of native arteries of extremities with intermittent claudication, unspecified extremity: Secondary | ICD-10-CM | POA: Diagnosis not present

## 2019-09-29 DIAGNOSIS — E782 Mixed hyperlipidemia: Secondary | ICD-10-CM | POA: Diagnosis not present

## 2019-09-29 DIAGNOSIS — E1122 Type 2 diabetes mellitus with diabetic chronic kidney disease: Secondary | ICD-10-CM | POA: Diagnosis not present

## 2019-09-29 DIAGNOSIS — I10 Essential (primary) hypertension: Secondary | ICD-10-CM

## 2019-09-29 DIAGNOSIS — Z95820 Peripheral vascular angioplasty status with implants and grafts: Secondary | ICD-10-CM | POA: Diagnosis not present

## 2019-09-29 DIAGNOSIS — I701 Atherosclerosis of renal artery: Secondary | ICD-10-CM | POA: Diagnosis not present

## 2019-09-29 DIAGNOSIS — I739 Peripheral vascular disease, unspecified: Secondary | ICD-10-CM | POA: Diagnosis not present

## 2019-09-29 DIAGNOSIS — I700 Atherosclerosis of aorta: Secondary | ICD-10-CM | POA: Diagnosis not present

## 2019-09-29 NOTE — Progress Notes (Signed)
MRN : [id number]  Tracey [name] is a 84 y.o. (1936-07-15) female who presents with chief complaint of  Chief Complaint  Patient presents with  . Follow-up    4week post angio  .  History of Present Illness: Patient returns today in follow up about a month after aortic stent placement with concomitant left renal artery stent due to the location of the aortic stenosis at the level of the left renal artery.  She is doing quite well.  The claudication symptoms that were debilitating are markedly improved.  She still has arthritic left knee pain that is going to need a knee replacement but with Covid restrictions that has been put on hold.  She reports no periprocedural complications.  Both her left radial access site and her femoral access site are well-healed.  ABIs today are completely normal at 1.09 on the right and 1.07 on the left with brisk triphasic waveforms and normal digital pressures.  This shows a significant improvement from her preprocedure studies.  Current Outpatient Medications  Medication Sig Dispense Refill  . acetaminophen (TYLENOL) 500 MG tablet Take 1,000 mg by mouth every 6 (six) hours as needed for moderate pain or headache.    Marland Kitchen aspirin 81 MG tablet Take 81 mg by mouth daily.     . clopidogrel (PLAVIX) 75 MG tablet Take 1 tablet (75 mg total) by mouth daily. 30 tablet 0  . glipiZIDE (GLUCOTROL XL) 2.5 MG 24 hr tablet TAKE 1 TABLET (2.5 MG TOTAL) BY MOUTH DAILY WITH BREAKFAST. 90 tablet 1  . glucose blood (ONE TOUCH TEST STRIPS) test strip USE AS DIRECTED TO CHECK BLOOD SUGAR TWICE DAILY AND PRN E08.00 100 each 2  . lisinopril (PRINIVIL,ZESTRIL) 10 MG tablet Take 1 tablet (10 mg total) by mouth daily. 90 tablet 3  . metFORMIN (GLUCOPHAGE-XR) 500 MG 24 hr tablet TAKE 1 TABLET (500 MG TOTAL) BY MOUTH 2 (TWO) TIMES DAILY AFTER A MEAL. 180 tablet 0  . nitrofurantoin, macrocrystal-monohydrate, (MACROBID) 100 MG capsule Take 1 capsule (100 mg total) by mouth 2 (two) times  daily. 14 capsule 0  . OneTouch Delica Lancets [id number] MISC USE AS INSTRUCTED TO TEST BLOOD SUGAR TWICE DAILY AS NEEDED (DX. E08.00) 100 each 1  . pantoprazole (PROTONIX) 40 MG tablet Take 1 tablet (40 mg total) by mouth daily. 90 tablet 1  . Probiotic Product (ALIGN) 4 MG CAPS Take 1 capsule by mouth daily.    . rosuvastatin (CRESTOR) 40 MG tablet Take 1 tablet (40 mg total) by mouth daily. PLEASE CALL [phone] TO SCHEDULE AN APPOINTMENT FOR FURTHER REFILLS. THANK YOU! 90 tablet 0  . carvedilol (COREG) 3.125 MG tablet Take 1 tablet (3.125 mg total) by mouth as directed. Take 1 tablet (3.125 mg) twice a day and may take extra tablet (3.125 mg) for palpitations 270 tablet 3  . nitroGLYCERIN (NITROSTAT) 0.4 MG SL tablet Place 1 tablet (0.4 mg total) under the tongue every 5 (five) minutes as needed for chest pain. 25 tablet 6   No current facility-administered medications for this visit.    Past Medical History:  Diagnosis Date  . Anxiety   . Arthritis   . Atrial fibrillation ([place])    history  . Coronary artery disease   . Diabetes mellitus    non insulin dependent  . GERD (gastroesophageal reflux disease)   . Heart murmur   . Hyperlipidemia   . Hypertension   . Peripheral vascular disease ([place])   . UTI (urinary tract  infection)     Past Surgical History:  Procedure Laterality Date  . APPENDECTOMY    . BREAST BIOPSY Left    20 years ago  . BREAST BIOPSY Right 2008  . BREAST EXCISIONAL BIOPSY     rt 2008 lt "20 years ago"  . CARDIAC CATHETERIZATION    . CATARACT EXTRACTION W/PHACO Left 08/15/2015   Procedure: CATARACT EXTRACTION PHACO AND INTRAOCULAR LENS PLACEMENT (IOC);  Surgeon: Estill Cotta, MD;  Location: [facility] ORS;  Service: Ophthalmology;  Laterality: Left;  [place] 01:28 AP% 26.4 CDE 35.73 fluid pack # [id number]  . COLONOSCOPY WITH PROPOFOL N/A 01/16/2016   Procedure: COLONOSCOPY WITH PROPOFOL;  Surgeon: Manya Silvas, MD;  Location: [facility] ENDOSCOPY;  Service: Endoscopy;   Laterality: N/A;  . CORONARY ANGIOPLASTY WITH STENT PLACEMENT    . CORONARY ARTERY BYPASS GRAFT     with LIMA  to the LAD, SVG to OM1, SVG to PDA  . ESOPHAGOGASTRODUODENOSCOPY (EGD) WITH PROPOFOL N/A 01/16/2016   Procedure: ESOPHAGOGASTRODUODENOSCOPY (EGD) WITH PROPOFOL;  Surgeon: Manya Silvas, MD;  Location: [facility] ENDOSCOPY;  Service: Endoscopy;  Laterality: N/A;  . LOWER EXTREMITY ANGIOGRAPHY N/A 07/08/2019   Procedure: Abdominal Aortagraphy;  Surgeon: Algernon Huxley, MD;  Location: [facility];  Service: Cardiovascular;  Laterality: N/A;  . RENAL ANGIOGRAPHY Left 08/26/2019   Procedure: RENAL ANGIOGRAPHY;  Surgeon: Algernon Huxley, MD;  Location: [facility];  Service: Cardiovascular;  Laterality: Left;  . RENAL ARTERY STENT     left, By Dr Hulda Humphrey  . REPLACEMENT TOTAL KNEE  03/25/2013   right  . TONSILLECTOMY     Social History       Tobacco Use  . Smoking status: Never Smoker  . Smokeless tobacco: Never Used  Substance Use Topics  . Alcohol use: No  . Drug use: No    Family History      Family History  Problem Relation Age of Onset  . Heart disease Father   . Other Brother        open heart surgery  . Cancer Brother   . Other Brother        open heart surgery  . Other Brother        open heart surgery  . Hyperlipidemia Daughter   . Breast cancer Neg Hx   . Kidney cancer Neg Hx   . Bladder Cancer Neg Hx          Allergies  Allergen Reactions  . Ciprofloxacin Other (See Comments)    Unknown  . Codeine Nausea Only  . Erythromycin Nausea And Vomiting  . Tramadol Other (See Comments)    Unknown     REVIEW OF SYSTEMS (Negative unless checked)  Constitutional: [] ?Weight loss  [] ?Fever  [] ?Chills Cardiac: [] ?Chest pain   [] ?Chest pressure   [] ?Palpitations   [] ?Shortness of breath when laying flat   [] ?Shortness of breath at rest   [] ?Shortness of breath with exertion. Vascular:  [x] ?Pain in legs with walking   [] ?Pain in  legs at rest   [] ?Pain in legs when laying flat   [x] ?Claudication   [] ?Pain in feet when walking  [] ?Pain in feet at rest  [] ?Pain in feet when laying flat   [] ?History of DVT   [] ?Phlebitis   [] ?Swelling in legs   [] ?Varicose veins   [] ?Non-healing ulcers Pulmonary:   [] ?Uses home oxygen   [] ?Productive cough   [] ?Hemoptysis   [] ?Wheeze  [] ?COPD   [] ?Asthma Neurologic:  [] ?Dizziness  [] ?Blackouts   [] ?  Seizures   [] ?History of stroke   [] ?History of TIA  [] ?Aphasia   [] ?Temporary blindness   [] ?Dysphagia   [] ?Weakness or numbness in arms   [] ?Weakness or numbness in legs Musculoskeletal:  [x] ?Arthritis   [] ?Joint swelling   [] ?Joint pain   [] ?Low back pain Hematologic:  [] ?Easy bruising  [] ?Easy bleeding   [] ?Hypercoagulable state   [] ?Anemic   Gastrointestinal:  [] ?Blood in stool   [] ?Vomiting blood  [x] ?Gastroesophageal reflux/heartburn   [] ?Abdominal pain Genitourinary:  [] ?Chronic kidney disease   [] ?Difficult urination  [] ?Frequent urination  [] ?Burning with urination   [] ?Hematuria Skin:  [] ?Rashes   [] ?Ulcers   [] ?Wounds Psychological:  [x] ?History of anxiety   [] ? History of major depression.    Physical Examination  BP 134/68 (BP Location: Right Arm)   Pulse 66   Resp 15   Wt 124 lb (56.2 kg)   BMI 21.97 kg/m  Gen:  WD/WN, NAD.  Appears younger than stated age Head: [place]/AT, No temporalis wasting. Ear/Nose/Throat: Hearing grossly intact, nares w/o erythema or drainage Eyes: Conjunctiva clear. Sclera non-icteric Neck: Supple.  Trachea midline Pulmonary:  Good air movement, no use of accessory muscles.  Cardiac: RRR, no JVD Vascular:  Vessel Right Left  Radial Palpable Palpable                          PT Palpable Palpable  DP Palpable Palpable   Gastrointestinal: soft, non-tender/non-distended. No guarding/reflex.  Musculoskeletal: M/S 5/5 throughout.  No deformity or atrophy.  No significant lower extremity edema. Neurologic: Sensation grossly intact in extremities.   Symmetrical.  Speech is fluent.  Psychiatric: Judgment intact, Mood & affect appropriate for pt's clinical situation. Dermatologic: No rashes or ulcers noted.  No cellulitis or open wounds.       Labs Recent Results (from the past 2160 hour(s))  SARS CORONAVIRUS 2 (TAT 6-24 HRS) Nasopharyngeal Nasopharyngeal Swab     Status: None   Collection Time: 07/03/19  1:43 PM   Specimen: Nasopharyngeal Swab  Result Value Ref Range   SARS Coronavirus 2 NEGATIVE NEGATIVE    Comment: (NOTE) SARS-CoV-2 target nucleic acids are NOT DETECTED. The SARS-CoV-2 RNA is generally detectable in upper and lower respiratory specimens during the acute phase of infection. Negative results do not preclude SARS-CoV-2 infection, do not rule out co-infections with other pathogens, and should not be used as the sole basis for treatment or other patient management decisions. Negative results must be combined with clinical observations, patient history, and epidemiological information. The expected result is Negative. Fact Sheet for Patients: SugarRoll.be Fact Sheet for Healthcare Providers: https://www.woods-mathews.com/ This test is not yet approved or cleared by the [place] FDA and  has been authorized for detection and/or diagnosis of SARS-CoV-2 by FDA under an Emergency Use Authorization (EUA). This EUA will remain  in effect (meaning this test can be used) for the duration of the COVID-19 declaration under Section 56 4(b)(1) of the Act, 21 U.S.C. section 360bbb-3(b)(1), unless the authorization is terminated or revoked sooner. Performed at [facility], [place] [address], [place], [place] [address]   Glucose, capillary     Status: Abnormal   Collection Time: 07/08/19  8:39 AM  Result Value Ref Range   Glucose-Capillary 119 (H) 70 - 99 mg/dL  BUN     Status: Abnormal   Collection Time: 07/08/19  8:57 AM  Result Value Ref Range   BUN 32 (H) 8 - 23  mg/dL  Comment: Performed at [facility], [address] [place], [place] [address]  Creatinine, serum     Status: Abnormal   Collection Time: 07/08/19  8:57 AM  Result Value Ref Range   Creatinine, Ser 1.28 (H) 0.44 - 1.00 mg/dL   GFR calc non Af Amer 39 (L) >60 mL/min   GFR calc Af Amer 46 (L) >60 mL/min    Comment: Performed at [facility], [place], [place] [address]  SARS CORONAVIRUS 2 (TAT 6-24 HRS) Nasopharyngeal Nasopharyngeal Swab     Status: None   Collection Time: 07/27/19 10:24 AM   Specimen: Nasopharyngeal Swab  Result Value Ref Range   SARS Coronavirus 2 NEGATIVE NEGATIVE    Comment: (NOTE) SARS-CoV-2 target nucleic acids are NOT DETECTED. The SARS-CoV-2 RNA is generally detectable in upper and lower respiratory specimens during the acute phase of infection. Negative results do not preclude SARS-CoV-2 infection, do not rule out co-infections with other pathogens, and should not be used as the sole basis for treatment or other patient management decisions. Negative results must be combined with clinical observations, patient history, and epidemiological information. The expected result is Negative. Fact Sheet for Patients: SugarRoll.be Fact Sheet for Healthcare Providers: https://www.woods-mathews.com/ This test is not yet approved or cleared by the [place] FDA and  has been authorized for detection and/or diagnosis of SARS-CoV-2 by FDA under an Emergency Use Authorization (EUA). This EUA will remain  in effect (meaning this test can be used) for the duration of the COVID-19 declaration under Section 56 4(b)(1) of the Act, 21 U.S.C. section 360bbb-3(b)(1), unless the authorization is terminated or revoked sooner. Performed at [facility], [place] [address], [place], [place] [address]   POCT Urinalysis Dipstick (Automated)     Status: Abnormal   Collection Time: 08/05/19  5:11  PM  Result Value Ref Range   Color, UA straw    Clarity, UA cloudy    Glucose, UA Negative Negative   Bilirubin, UA negative    Ketones, UA negative    Spec Grav, UA 1.015 1.010 - 1.025   Blood, UA 1+    pH, UA 6.0 5.0 - 8.0   Protein, UA Positive (A) Negative   Urobilinogen, UA negative (A) 0.2 or 1.0 E.U./dL   Nitrite, UA neg    Leukocytes, UA Small (1+) (A) Negative  Urine culture     Status: Abnormal   Collection Time: 08/05/19  5:12 PM   Specimen: Urine  Result Value Ref Range   MICRO NUMBER: [id number]    SPECIMEN QUALITY: Adequate    Sample Source NOT GIVEN    STATUS: FINAL    ISOLATE 1: Klebsiella pneumoniae (A)     Comment: 10,000-49,000 CFU/mL of Klebsiella pneumoniae   ISOLATE 2: Klebsiella pneumoniae (A)     Comment: 10,000-49,000 CFU/mL of Klebsiella pneumoniae      Susceptibility   Klebsiella pneumoniae - URINE CULTURE, REFLEX    AMOX/CLAVULANIC 4 Sensitive     AMPICILLIN 16 Resistant     AMPICILLIN/SULBACTAM <=2 Sensitive     CEFAZOLIN* <=4 Not Reportable      * For infections other than uncomplicated UTIcaused by E. coli, K. pneumoniae or P. mirabilis:Cefazolin is resistant if MIC > or = 8 mcg/mL.(Distinguishing susceptible versus intermediatefor isolates with MIC < or = 4 mcg/mL requiresadditional testing.)For uncomplicated UTI caused by E. coli,K. pneumoniae or P. mirabilis: Cefazolin issusceptible if MIC <32 mcg/mL and predictssusceptible to the oral agents cefaclor, cefdinir,cefpodoxime, cefprozil, cefuroxime,  cephalexinand loracarbef.    CEFEPIME <=1 Sensitive     CEFTRIAXONE <=1 Sensitive     CIPROFLOXACIN <=0.25 Sensitive     LEVOFLOXACIN <=0.12 Sensitive     ERTAPENEM <=0.5 Sensitive     GENTAMICIN <=1 Sensitive     IMIPENEM <=0.25 Sensitive     NITROFURANTOIN 64 Intermediate     PIP/TAZO <=4 Sensitive     TOBRAMYCIN <=1 Sensitive     TRIMETH/SULFA <=20 Sensitive    Klebsiella pneumoniae - URINE CULTURE NEGATIVE 2    AMOX/CLAVULANIC 4 Sensitive      AMPICILLIN 16 Resistant     AMPICILLIN/SULBACTAM 4 Sensitive     CEFAZOLIN <=4 Not Reportable     CEFEPIME <=1 Sensitive     CEFTRIAXONE <=1 Sensitive     CIPROFLOXACIN <=0.25 Sensitive     LEVOFLOXACIN <=0.12 Sensitive     ERTAPENEM <=0.5 Sensitive     GENTAMICIN <=1 Sensitive     IMIPENEM <=0.25 Sensitive     NITROFURANTOIN 64 Intermediate     PIP/TAZO <=4 Sensitive     TOBRAMYCIN <=1 Sensitive     TRIMETH/SULFA* <=20 Sensitive      * For infections other than uncomplicated UTIcaused by E. coli, K. pneumoniae or P. mirabilis:Cefazolin is resistant if MIC > or = 8 mcg/mL.(Distinguishing susceptible versus intermediatefor isolates with MIC < or = 4 mcg/mL requiresadditional testing.)For uncomplicated UTI caused by E. coli,K. pneumoniae or P. mirabilis: Cefazolin issusceptible if MIC <32 mcg/mL and predictssusceptible to the oral agents cefaclor, cefdinir,cefpodoxime, cefprozil, cefuroxime, cephalexinand loracarbef.Legend:S = Susceptible  I = IntermediateR = Resistant  NS = Not susceptible* = Not tested  NR = Not reported**NN = See antimicrobic comments  Urine Culture     Status: None   Collection Time: 08/20/19  2:22 PM   Specimen: Urine  Result Value Ref Range   MICRO NUMBER: [id number]    SPECIMEN QUALITY: Adequate    Sample Source NOT GIVEN    STATUS: FINAL    Result:      Growth of mixed flora was isolated, suggesting probable contamination. No further testing will be performed. If clinically indicated, recollection using a method to minimize contamination, with prompt transfer to Urine Culture Transport Tube, is  recommended.   SARS CORONAVIRUS 2 (TAT 6-24 HRS) Nasopharyngeal Nasopharyngeal Swab     Status: None   Collection Time: 08/21/19 12:35 PM   Specimen: Nasopharyngeal Swab  Result Value Ref Range   SARS Coronavirus 2 NEGATIVE NEGATIVE    Comment: (NOTE) SARS-CoV-2 target nucleic acids are NOT DETECTED. The SARS-CoV-2 RNA is generally detectable in upper and  lower respiratory specimens during the acute phase of infection. Negative results do not preclude SARS-CoV-2 infection, do not rule out co-infections with other pathogens, and should not be used as the sole basis for treatment or other patient management decisions. Negative results must be combined with clinical observations, patient history, and epidemiological information. The expected result is Negative. Fact Sheet for Patients: SugarRoll.be Fact Sheet for Healthcare Providers: https://www.woods-mathews.com/ This test is not yet approved or cleared by the [place] FDA and  has been authorized for detection and/or diagnosis of SARS-CoV-2 by FDA under an Emergency Use Authorization (EUA). This EUA will remain  in effect (meaning this test can be used) for the duration of the COVID-19 declaration under Section 56 4(b)(1) of the Act, 21 U.S.C. section 360bbb-3(b)(1), unless the authorization is terminated or revoked sooner. Performed at [facility], [place] [address], [place], [place] [address]  Glucose, capillary     Status: Abnormal   Collection Time: 08/26/19  7:27 AM  Result Value Ref Range   Glucose-Capillary 117 (H) 70 - 99 mg/dL  BUN     Status: Abnormal   Collection Time: 08/26/19  7:28 AM  Result Value Ref Range   BUN 31 (H) 8 - 23 mg/dL    Comment: Performed at [facility], [address] [place], [place] [address]  Creatinine, serum     Status: Abnormal   Collection Time: 08/26/19  7:28 AM  Result Value Ref Range   Creatinine, Ser 1.30 (H) 0.44 - 1.00 mg/dL   GFR calc non Af Amer 38 (L) >60 mL/min   GFR calc Af Amer 45 (L) >60 mL/min    Comment: Performed at [facility], [address] [place], [place] [address]  Glucose, capillary     Status: Abnormal   Collection Time: 08/26/19 10:34 AM  Result Value Ref Range   Glucose-Capillary 118 (H) 70 - 99 mg/dL  POCT Urinalysis Dipstick     Status:  Abnormal   Collection Time: 09/24/19  2:58 PM  Result Value Ref Range   Color, UA yellow    Clarity, UA cloudy    Glucose, UA Negative Negative   Bilirubin, UA neg    Ketones, UA neg    Spec Grav, UA >=1.030 (A) 1.010 - 1.025   Blood, UA 3+    pH, UA 6.0 5.0 - 8.0   Protein, UA Positive (A) Negative    Comment: +3   Urobilinogen, UA 0.2 0.2 or 1.0 E.U./dL   Nitrite, UA positive    Leukocytes, UA Moderate (2+) (A) Negative   Appearance yellow;cloudy    Odor    Urine Culture     Status: Abnormal   Collection Time: 09/24/19  4:15 PM   Specimen: Urine   UR  Result Value Ref Range   Urine Culture, Routine Final report (A)    Organism ID, Bacteria Escherichia coli (A)     Comment: Greater than 100,000 colony forming units per mL Cefazolin <=4 ug/mL Cefazolin with an MIC <=16 predicts susceptibility to the oral agents cefaclor, cefdinir, cefpodoxime, cefprozil, cefuroxime, cephalexin, and loracarbef when used for therapy of uncomplicated urinary tract infections due to E. coli, Klebsiella pneumoniae, and Proteus mirabilis.    Antimicrobial Susceptibility Comment     Comment:       ** S = Susceptible; I = Intermediate; R = Resistant **                    P = Positive; N = Negative             MICS are expressed in micrograms per mL    Antibiotic                 RSLT#1    RSLT#2    RSLT#3    RSLT#4 Amoxicillin/Clavulanic Acid    S Ampicillin                     R Cefepime                       S Ceftriaxone                    S Cefuroxime                     S Ciprofloxacin  S Ertapenem                      S Gentamicin                     S Imipenem                       S Levofloxacin                   S Meropenem                      S Nitrofurantoin                 S Piperacillin/Tazobactam        S Tetracycline                   S Tobramycin                     S Trimethoprim/Sulfa             S     Radiology No results  found.  Assessment/Plan Mixed hyperlipidemia lipid control important in reducing the progression of atherosclerotic disease. Continue statin therapy   Type 2 diabetes mellitus with stage 2 chronic kidney disease, without long-term current use of insulin (HCC) blood glucose control important in reducing the progression of atherosclerotic disease. Also, involved in wound healing. On appropriate medications.   Essential hypertension, benign blood pressure control important in reducing the progression of atherosclerotic disease. On appropriate oral medications.  Renal artery stenosis (HCC) Check renal duplex at next visit in 3 months  PAD (peripheral artery disease) ([place]) Claudication symptoms are markedly improved after intervention last month.  No periprocedural complications.  Plan to see her back in 3 months with noninvasive studies to check her lower extremity perfusion as well as her renal artery stent.    [name], MD  09/29/2019 4:53 PM    This note was created with Dragon medical transcription system.  Any errors from dictation are purely unintentional

## 2019-09-29 NOTE — Assessment & Plan Note (Signed)
Claudication symptoms are markedly improved after intervention last month.  No periprocedural complications.  Plan to see her back in 3 months with noninvasive studies to check her lower extremity perfusion as well as her renal artery stent.

## 2019-09-29 NOTE — Assessment & Plan Note (Signed)
Check renal duplex at next visit in 3 months

## 2019-10-03 ENCOUNTER — Other Ambulatory Visit: Payer: Self-pay | Admitting: Cardiovascular Disease

## 2019-10-06 DIAGNOSIS — R69 Illness, unspecified: Secondary | ICD-10-CM | POA: Diagnosis not present

## 2019-10-08 ENCOUNTER — Encounter: Payer: Self-pay | Admitting: Obstetrics and Gynecology

## 2019-10-08 ENCOUNTER — Other Ambulatory Visit: Payer: Self-pay

## 2019-10-08 ENCOUNTER — Ambulatory Visit: Payer: Medicare HMO | Admitting: Obstetrics and Gynecology

## 2019-10-08 VITALS — BP 132/64 | HR 69 | Ht 63.0 in | Wt 126.0 lb

## 2019-10-08 DIAGNOSIS — R399 Unspecified symptoms and signs involving the genitourinary system: Secondary | ICD-10-CM | POA: Diagnosis not present

## 2019-10-08 DIAGNOSIS — Z87440 Personal history of urinary (tract) infections: Secondary | ICD-10-CM

## 2019-10-08 DIAGNOSIS — N952 Postmenopausal atrophic vaginitis: Secondary | ICD-10-CM | POA: Diagnosis not present

## 2019-10-08 LAB — POCT URINALYSIS DIPSTICK
Bilirubin, UA: NEGATIVE
Glucose, UA: NEGATIVE
Ketones, UA: NEGATIVE
Nitrite, UA: NEGATIVE
Protein, UA: POSITIVE — AB
Spec Grav, UA: 1.02 (ref 1.010–1.025)
Urobilinogen, UA: 0.2 E.U./dL
pH, UA: 6 (ref 5.0–8.0)

## 2019-10-08 MED ORDER — PREMARIN 0.625 MG/GM VA CREA: TOPICAL_CREAM | VAGINAL | 12 refills | Status: DC

## 2019-10-08 NOTE — Progress Notes (Signed)
Pt present for f/u for vaginal pain. Pt stated that her pain is better but is still having UTI symptoms. UA completed and documented.

## 2019-10-08 NOTE — Progress Notes (Signed)
    GYNECOLOGY PROGRESS NOTE  Subjective:    Patient ID: Tracey [name], female    DOB: 1936/04/29, 84 y.o.   MRN: [id number]  HPI  Patient is a 84 y.o. G68P4 female who presents for 2 week f/u of vaginal pain, vaginal atrophy, and UTI.  Notes she completed her antibiotic (Macrobid) treatment yesterday.  Was beginning to feel better, but yesterday began noting some residual UTI symptoms (urinary urgency).  States that she usually has to take 2 rounds of antibiotics before her symptoms clear. Notes improvement with her vaginal pain and atrophy with use of local estrogen therapy.   The following portions of the patient's history were reviewed and updated as appropriate: allergies, current medications, past family history, past medical history, past social history, past surgical history and problem list.   Review of Systems Pertinent items noted in HPI and remainder of comprehensive ROS otherwise negative.  Objective:   Blood pressure 132/64, pulse 69, height 5\' 3"  (1.6 m), weight 126 lb (57.2 kg). General appearance: alert and no distress Remainder of exam deferred.    Labs:  Results for orders placed or performed in visit on 10/08/19  POCT urinalysis dipstick  Result Value Ref Range   Color, UA yellow    Clarity, UA clear    Glucose, UA Negative Negative   Bilirubin, UA neg    Ketones, UA neg    Spec Grav, UA 1.020 1.010 - 1.025   Blood, UA 1+    pH, UA 6.0 5.0 - 8.0   Protein, UA Positive (A) Negative   Urobilinogen, UA 0.2 0.2 or 1.0 E.U./dL   Nitrite, UA neg    Leukocytes, UA Moderate (2+) (A) Negative   Appearance yellow;clear    Odor       Assessment:   Vaginal atrophy History of UTI UTI symptoms  Plan:   1. Vaginal atrophy - encouraged to continue use of Premarin cream as it is helping her vaginal pain symptoms.  Will send in prescription. Currently using samples.  2. History of UTI, recently treated. Still with UTI symptoms. Given patient samples of Uribel. UA  still with some blood, leukocytes, and protein.  Just completed antibiotic course yesterday.  Will repeat culture to ensure resolution of UTI and treat accordingly.   RTC in 2 months for f/u of vaginal atrophy.    Rubie Maid, MD [facility]

## 2019-10-11 LAB — URINE CULTURE

## 2019-10-12 ENCOUNTER — Ambulatory Visit: Payer: Medicare HMO | Admitting: "Endocrinology

## 2019-10-16 ENCOUNTER — Other Ambulatory Visit: Payer: Self-pay | Admitting: Cardiovascular Disease

## 2019-10-16 ENCOUNTER — Other Ambulatory Visit: Payer: Self-pay

## 2019-10-16 MED ORDER — SULFAMETHOXAZOLE-TRIMETHOPRIM 800-160 MG PO TABS: 1.0000 | ORAL_TABLET | Freq: Two times a day (BID) | ORAL | 0 refills | Status: DC

## 2019-10-16 NOTE — Telephone Encounter (Signed)
Pt is aware of test results.  

## 2019-10-20 DIAGNOSIS — K529 Noninfective gastroenteritis and colitis, unspecified: Secondary | ICD-10-CM | POA: Diagnosis not present

## 2019-10-20 DIAGNOSIS — Z7902 Long term (current) use of antithrombotics/antiplatelets: Secondary | ICD-10-CM | POA: Diagnosis not present

## 2019-10-20 DIAGNOSIS — R634 Abnormal weight loss: Secondary | ICD-10-CM | POA: Diagnosis not present

## 2019-10-20 DIAGNOSIS — Z800 Family history of malignant neoplasm of digestive organs: Secondary | ICD-10-CM | POA: Diagnosis not present

## 2019-10-20 DIAGNOSIS — I10 Essential (primary) hypertension: Secondary | ICD-10-CM | POA: Diagnosis not present

## 2019-10-20 DIAGNOSIS — E785 Hyperlipidemia, unspecified: Secondary | ICD-10-CM | POA: Diagnosis not present

## 2019-10-20 DIAGNOSIS — E119 Type 2 diabetes mellitus without complications: Secondary | ICD-10-CM | POA: Diagnosis not present

## 2019-10-20 DIAGNOSIS — Z86010 Personal history of colonic polyps: Secondary | ICD-10-CM | POA: Diagnosis not present

## 2019-10-20 DIAGNOSIS — I4891 Unspecified atrial fibrillation: Secondary | ICD-10-CM | POA: Diagnosis not present

## 2019-10-21 ENCOUNTER — Encounter: Payer: Self-pay | Admitting: "Endocrinology

## 2019-10-21 ENCOUNTER — Ambulatory Visit: Payer: Medicare HMO | Admitting: "Endocrinology

## 2019-10-21 ENCOUNTER — Other Ambulatory Visit: Payer: Self-pay

## 2019-10-21 VITALS — BP 122/65 | HR 64 | Ht 63.0 in | Wt 123.4 lb

## 2019-10-21 DIAGNOSIS — E1122 Type 2 diabetes mellitus with diabetic chronic kidney disease: Secondary | ICD-10-CM

## 2019-10-21 DIAGNOSIS — N182 Chronic kidney disease, stage 2 (mild): Secondary | ICD-10-CM

## 2019-10-21 DIAGNOSIS — E782 Mixed hyperlipidemia: Secondary | ICD-10-CM

## 2019-10-21 DIAGNOSIS — I10 Essential (primary) hypertension: Secondary | ICD-10-CM | POA: Diagnosis not present

## 2019-10-21 LAB — POCT GLYCOSYLATED HEMOGLOBIN (HGB A1C): Hemoglobin A1C: 6.4 % — AB (ref 4.0–5.6)

## 2019-10-21 MED ORDER — METFORMIN HCL ER 500 MG PO TB24: 500.0000 mg | ORAL_TABLET | Freq: Every day | ORAL | 1 refills | Status: DC

## 2019-10-21 NOTE — Progress Notes (Signed)
10/21/2019, 1:12 PM  Endocrinology follow-up note   Subjective:    Patient ID: Tracey [name], female    DOB: 25-Sep-1935.  Tracey [name] is being seen  in follow-up after she was seen in consultation for management of currently uncontrolled symptomatic diabetes requested by  Abner Greenspan, MD.   Past Medical History:  Diagnosis Date  . Anxiety   . Arthritis   . Atrial fibrillation ([place])    history  . Coronary artery disease   . Diabetes mellitus    non insulin dependent  . GERD (gastroesophageal reflux disease)   . Heart murmur   . Hyperlipidemia   . Hypertension   . Peripheral vascular disease ([address])   . UTI (urinary tract infection)     Past Surgical History:  Procedure Laterality Date  . APPENDECTOMY    . BREAST BIOPSY Left    20 years ago  . BREAST BIOPSY Right 2008  . BREAST EXCISIONAL BIOPSY     rt 2008 lt "20 years ago"  . CARDIAC CATHETERIZATION    . CATARACT EXTRACTION W/PHACO Left 08/15/2015   Procedure: CATARACT EXTRACTION PHACO AND INTRAOCULAR LENS PLACEMENT (IOC);  Surgeon: Estill Cotta, MD;  Location: [facility] ORS;  Service: Ophthalmology;  Laterality: Left;  [place] 01:28 AP% 26.4 CDE 35.73 fluid pack # [id number]  . COLONOSCOPY WITH PROPOFOL N/A 01/16/2016   Procedure: COLONOSCOPY WITH PROPOFOL;  Surgeon: Manya Silvas, MD;  Location: [facility] ENDOSCOPY;  Service: Endoscopy;  Laterality: N/A;  . CORONARY ANGIOPLASTY WITH STENT PLACEMENT    . CORONARY ARTERY BYPASS GRAFT     with LIMA  to the LAD, SVG to OM1, SVG to PDA  . ESOPHAGOGASTRODUODENOSCOPY (EGD) WITH PROPOFOL N/A 01/16/2016   Procedure: ESOPHAGOGASTRODUODENOSCOPY (EGD) WITH PROPOFOL;  Surgeon: Manya Silvas, MD;  Location: [facility] ENDOSCOPY;  Service: Endoscopy;  Laterality: N/A;  . LOWER EXTREMITY ANGIOGRAPHY N/A 07/08/2019   Procedure: Abdominal Aortagraphy;  Surgeon: Algernon Huxley, MD;  Location: [place] [facility]  LAB;  Service: Cardiovascular;  Laterality: N/A;  . RENAL ANGIOGRAPHY Left 08/26/2019   Procedure: RENAL ANGIOGRAPHY;  Surgeon: Algernon Huxley, MD;  Location: [place] [facility] LAB;  Service: Cardiovascular;  Laterality: Left;  . RENAL ARTERY STENT     left, By Dr Hulda Humphrey  . REPLACEMENT TOTAL KNEE  03/25/2013   right  . TONSILLECTOMY      Social History   Socioeconomic History  . Marital status: Widowed    Spouse name: Not on file  . Number of children: Not on file  . Years of education: Not on file  . Highest education level: Not on file  Occupational History  . Not on file  Tobacco Use  . Smoking status: Never Smoker  . Smokeless tobacco: Never Used  Substance and Sexual Activity  . Alcohol use: No  . Drug use: No  . Sexual activity: Never  Other Topics Concern  . Not on file  Social History Narrative  . Not on file   Social Determinants of Health   Financial Resource Strain:   . Difficulty of Paying Living Expenses: Not on file  Food Insecurity:   . Worried About Charity fundraiser in the Last Year: Not on file  . Ran Out of Food in the Last Year: Not on file  Transportation Needs:   . Lack of Transportation (Medical): Not on file  . Lack of Transportation (Non-Medical): Not on file  Physical Activity:   . Days of Exercise per Week: Not on file  . Minutes of Exercise per Session: Not on file  Stress:   . Feeling of Stress : Not on file  Social Connections:   . Frequency of Communication with Friends and Family: Not on file  . Frequency of Social Gatherings with Friends and Family: Not on file  . Attends Religious Services: Not on file  . Active Member of Clubs or Organizations: Not on file  . Attends Archivist Meetings: Not on file  . Marital Status: Not on file    Family History  Problem Relation Age of Onset  . Heart disease Father   . Other Brother        open heart surgery  . Cancer Brother   . Other Brother        open heart surgery  . Other  Brother        open heart surgery  . Hyperlipidemia Daughter   . Breast cancer Neg Hx   . Kidney cancer Neg Hx   . Bladder Cancer Neg Hx     Outpatient Encounter Medications as of 10/21/2019  Medication Sig  . acetaminophen (TYLENOL) 500 MG tablet Take 1,000 mg by mouth every 6 (six) hours as needed for moderate pain or headache.  Marland Kitchen aspirin 81 MG tablet Take 81 mg by mouth daily.   . carvedilol (COREG) 3.125 MG tablet Take 1 tablet (3.125 mg total) by mouth as directed. Take 1 tablet (3.125 mg) twice a day and may take extra tablet (3.125 mg) for palpitations  . clopidogrel (PLAVIX) 75 MG tablet TAKE 1 TABLET BY MOUTH EVERY DAY  . conjugated estrogens (PREMARIN) vaginal cream Apply 1 applicator (0.5 mg) vaginally 2-3 times weekly.  Marland Kitchen glipiZIDE (GLUCOTROL XL) 2.5 MG 24 hr tablet TAKE 1 TABLET (2.5 MG TOTAL) BY MOUTH DAILY WITH BREAKFAST.  Marland Kitchen glucose blood (ONE TOUCH TEST STRIPS) test strip USE AS DIRECTED TO CHECK BLOOD SUGAR TWICE DAILY AND PRN E08.00  . lisinopril (PRINIVIL,ZESTRIL) 10 MG tablet Take 1 tablet (10 mg total) by mouth daily.  . metFORMIN (GLUCOPHAGE-XR) 500 MG 24 hr tablet Take 1 tablet (500 mg total) by mouth daily after breakfast.  . nitrofurantoin, macrocrystal-monohydrate, (MACROBID) 100 MG capsule Take 1 capsule (100 mg total) by mouth 2 (two) times daily. (Patient not taking: Reported on 10/08/2019)  . nitroGLYCERIN (NITROSTAT) 0.4 MG SL tablet Place 1 tablet (0.4 mg total) under the tongue every 5 (five) minutes as needed for chest pain.  [name] Lancets [id number] MISC USE AS INSTRUCTED TO TEST BLOOD SUGAR TWICE DAILY AS NEEDED (DX. E08.00)  . pantoprazole (PROTONIX) 40 MG tablet Take 1 tablet (40 mg total) by mouth daily.  . Probiotic Product (ALIGN) 4 MG CAPS Take 1 capsule by mouth daily.  . rosuvastatin (CRESTOR) 40 MG tablet TAKE 1 TABLET BY MOUTH DAILY. PLEASE CALL [phone] TO SCHEDULE AN APPT FOR FURTHER REFILLS  . sulfamethoxazole-trimethoprim (BACTRIM DS)  800-160 MG tablet Take 1 tablet by mouth 2 (two) times daily.  . [DISCONTINUED] metFORMIN (GLUCOPHAGE-XR) 500 MG 24 hr tablet TAKE 1 TABLET (500 MG TOTAL) BY MOUTH 2 (TWO) TIMES  DAILY AFTER A MEAL.   No facility-administered encounter medications on file as of 10/21/2019.    ALLERGIES: Allergies  Allergen Reactions  . Ciprofloxacin Other (See Comments)    Unknown  . Codeine Nausea Only  . Erythromycin Nausea And Vomiting  . Tramadol Nausea Only    Unknown    VACCINATION STATUS: Immunization History  Administered Date(s) Administered  . Fluad Quad(high Dose 65+) 06/15/2019  . Influenza Split 08/10/2015  . Influenza,inj,Quad PF,6+ Mos 05/24/2016, 07/26/2017, 06/03/2018  . Influenza-Unspecified 08/10/2015, 05/24/2016  . Pneumococcal Conjugate-13 11/05/2016  . Pneumococcal Polysaccharide-23 12/11/2017  . Td 03/17/2013    Diabetes She presents for her follow-up diabetic visit. She has type 2 diabetes mellitus. Onset time: She was diagnosed at approximate age of 62 years. Her disease course has been improving. There are no hypoglycemic associated symptoms. Pertinent negatives for hypoglycemia include no confusion, headaches, pallor or seizures. Pertinent negatives for diabetes include no chest pain, no fatigue, no polydipsia, no polyphagia and no polyuria. There are no hypoglycemic complications. Symptoms are improving. Diabetic complications include nephropathy. Risk factors for coronary artery disease include dyslipidemia, diabetes mellitus, hypertension and sedentary lifestyle. Her weight is decreasing rapidly (She lost 5 pounds since last visit.). She is following a generally unhealthy diet. When asked about meal planning, she reported none. She rarely participates in exercise. Her breakfast blood glucose range is generally 110-130 mg/dl. Her bedtime blood glucose range is generally 130-140 mg/dl. Her overall blood glucose range is 130-140 mg/dl. (She presents with controlled glycemic  profile and point-of-care A1c 6.4%, generally improving from 8.3%.    ) An ACE inhibitor/angiotensin II receptor blocker is being taken.  Hyperlipidemia This is a chronic problem. The current episode started more than 1 year ago. Exacerbating diseases include diabetes. Pertinent negatives include no chest pain, myalgias or shortness of breath. Risk factors for coronary artery disease include dyslipidemia, diabetes mellitus, hypertension and post-menopausal.  Hypertension This is a chronic problem. The current episode started more than 1 year ago. Pertinent negatives include no chest pain, headaches, palpitations or shortness of breath. Risk factors for coronary artery disease include diabetes mellitus, dyslipidemia, sedentary lifestyle and post-menopausal state. Past treatments include beta blockers and ACE inhibitors. Hypertensive end-organ damage includes kidney disease.    Constitutional: + Minimally fluctuating body weight, no fatigue, no subjective hyperthermia, no subjective hypothermia Eyes: no blurry vision, no xerophthalmia ENT: no sore throat, no nodules palpated in throat, no dysphagia/odynophagia, no hoarseness Cardiovascular: no Chest Pain, no Shortness of Breath, no palpitations, no leg swelling Respiratory: no cough, no SOB Gastrointestinal: no Nausea/Vomiting/Diarhhea Musculoskeletal: no muscle/joint aches Skin: no rashes Neurological: no tremors, no numbness, no tingling, no dizziness Psychiatric: no depression, no anxiety   Objective:    BP 122/65   Pulse 64   Ht 5\' 3"  (1.6 m)   Wt 123 lb 6.4 oz (56 kg)   BMI 21.86 kg/m   Wt Readings from Last 3 Encounters:  10/21/19 123 lb 6.4 oz (56 kg)  10/08/19 126 lb (57.2 kg)  09/29/19 124 lb (56.2 kg)     Physical Exam Constitutional:      Appearance: She is well-developed.  HENT:     Head: Normocephalic and atraumatic.  Neck:     Thyroid: No thyromegaly.     Trachea: No tracheal deviation.  Cardiovascular:      Rate and Rhythm: Normal rate.  Pulmonary:     Effort: Pulmonary effort is normal.  Abdominal:     Tenderness: There is no abdominal  tenderness. There is no guarding.  Musculoskeletal:        General: Normal range of motion.     Cervical back: Normal range of motion and neck supple.  Skin:    General: Skin is warm and dry.     Coloration: Skin is not pale.     Findings: No erythema or rash.  Neurological:     Mental Status: She is alert and oriented to person, place, and time.     Cranial Nerves: No cranial nerve deficit.     Coordination: Coordination normal.     Deep Tendon Reflexes: Reflexes are normal and symmetric.  Psychiatric:        Judgment: Judgment normal.      CMP ( most recent) CMP     Component Value Date/Time   NA 141 03/05/2019 0916   NA 137 03/27/2013 0545   K 4.5 03/05/2019 0916   K 3.9 03/27/2013 0545   CL 106 03/05/2019 0916   CL 103 03/27/2013 0545   CO2 27 03/05/2019 0916   CO2 27 03/27/2013 0545   GLUCOSE 139 (H) 03/05/2019 0916   GLUCOSE 122 (H) 03/27/2013 0545   BUN 31 (H) 08/26/2019 0728   BUN 10 03/27/2013 0545   CREATININE 1.30 (H) 08/26/2019 0728   CREATININE 1.31 (H) 02/01/2017 1547   CALCIUM 10.6 (H) 03/05/2019 0916   CALCIUM 8.9 03/27/2013 0545   PROT 6.4 03/05/2019 0916   PROT 7.6 10/07/2011 1316   ALBUMIN 4.4 03/05/2019 0916   ALBUMIN 4.5 10/07/2011 1316   AST 16 03/05/2019 0916   AST 26 10/07/2011 1316   ALT 10 03/05/2019 0916   ALT 26 10/07/2011 1316   ALKPHOS 44 03/05/2019 0916   ALKPHOS 46 (L) 10/07/2011 1316   BILITOT 0.5 03/05/2019 0916   BILITOT 0.5 10/07/2011 1316   GFRNONAA 38 (L) 08/26/2019 0728   GFRNONAA >60 03/27/2013 0545   GFRAA 45 (L) 08/26/2019 0728   GFRAA >60 03/27/2013 0545     Diabetic Labs (most recent): Lab Results  Component Value Date   HGBA1C 6.4 (A) 10/21/2019   HGBA1C 6.5 (A) 06/12/2019   HGBA1C 8.3 (H) 03/06/2019     Lipid Panel ( most recent) Lipid Panel     Component Value  Date/Time   CHOL 161 03/05/2019 0916   CHOL 141 10/08/2011 0457   TRIG 145.0 03/05/2019 0916   TRIG 179 10/08/2011 0457   HDL 46.90 03/05/2019 0916   HDL 37 (L) 10/08/2011 0457   CHOLHDL 3 03/05/2019 0916   VLDL 29.0 03/05/2019 0916   VLDL 36 10/08/2011 0457   LDLCALC 85 03/05/2019 0916   LDLCALC 68 10/08/2011 0457      Lab Results  Component Value Date   TSH 2.68 03/05/2019   TSH 2.96 11/06/2017   TSH 1.24 02/01/2017   TSH 1.86 02/06/2016      Assessment & Plan:   Type 2 diabetes complicated by stage 2 CKD  - Tracey [name] has currently controlled asymptomatic type 2 DM since  84 years of age. -She presents with controlled glycemic profile, and her point-of-care A1c is 6.4% today improving from 8.3%.  - I had a long discussion with her about the progressive nature of diabetes and the pathology behind its complications. -her diabetes is complicated by stage 2 CKD and she remains at a high risk for more acute and chronic complications which include CAD, CVA, CKD, retinopathy, and neuropathy. These are all discussed in detail with her.  - I  have counseled her on diet management by adopting a carbohydrate restricted/protein rich diet.  -  Suggestion is made for her to avoid simple carbohydrates  from her diet including Cakes, Sweet Desserts / Pastries, Ice Cream, Soda (diet and regular), Sweet Tea, Candies, Chips, Cookies, Sweet Pastries,  Store Bought Juices, Alcohol in Excess of  1-2 drinks a day, Artificial Sweeteners, Coffee Creamer, and "Sugar-free" Products. This will help patient to have stable blood glucose profile and potentially avoid unintended weight gain.   - I encouraged her to switch to  unprocessed or minimally processed complex starch and increased protein intake (animal or plant source), fruits, and vegetables.  - she is advised to stick to a routine mealtimes to eat 3 meals  a day and avoid unnecessary snacks ( to snack only to correct hypoglycemia).    -  I have approached her with the following individualized plan to manage  her diabetes and patient agrees:   -She has well-controlled type 2 diabetes with A1c of 6.4%. -She can cautiously continue to use metformin with adequate hydration , advised to lower her metformin to 500 mg ER only after breakfast.  Advised to continue   2.5 mg XL p.o. daily.    -She is willing to continue monitoring blood glucose twice a day-daily before breakfast and at bedtime.   - she is encouraged to call clinic for blood glucose levels less than 70 or above 200 mg /dl.   - she is not a candidate for SGLT2 inhibitors due to concurrent renal insufficiency.  - she is not a candidate for incretin therapy due to her body habitus.     - Patient specific target  A1c;  LDL, HDL, Triglycerides, and  Waist Circumference were discussed in detail.  2) Blood Pressure /Hypertension:  her blood pressure is  controlled to target.   she is advised to continue her current medications including lisinopril 10 mg p.o. daily with breakfast .  3) Lipids/Hyperlipidemia:   Review of her recent lipid panel showed  controlled  LDL at 85 .  she  is advised to continue Crestor 40 mg p.o. daily at bedtime. side effects and precautions discussed with her.  4)  Weight/Diet:  Body mass index is 21.86 kg/m.  -She is not a candidate for weight loss.    Exercise, and detailed carbohydrates information provided  -  detailed on discharge instructions.  5) Chronic Care/Health Maintenance:  -she  is on ACEI/ARB and Statin medications and  is encouraged to initiate and continue to follow up with Ophthalmology, Dentist,  Podiatrist at least yearly or according to recommendations, and advised to   stay away from smoking. I have recommended yearly flu vaccine and pneumonia vaccine at least every 5 years; moderate intensity exercise for up to 150 minutes weekly; and  sleep for at least 7 hours a day.  - she is  advised to maintain close follow up with  Tower, Wynelle Fanny, MD for primary care needs, as well as her other providers for optimal and coordinated care.  - Time spent on this patient care encounter:  35 min, of which > 50% was spent in  counseling and the rest reviewing her blood glucose logs , discussing her hypoglycemia and hyperglycemia episodes, reviewing her current and  previous labs / studies  ( including abstraction from other facilities) and medications  doses and developing a  long term treatment plan and documenting her care.   Please refer to Patient Instructions for Blood Glucose Monitoring  and Insulin/Medications Dosing Guide"  in media tab for additional information. Please  also refer to " Patient Self Inventory" in the Media  tab for reviewed elements of pertinent patient history.  Julio Sicks participated in the discussions, expressed understanding, and voiced agreement with the above plans.  All questions were answered to her satisfaction. she is encouraged to contact clinic should she have any questions or concerns prior to her return visit.   Follow up plan: - Return in about 3 months (around 01/18/2020) for Bring Meter and Logs- A1c in Office, Follow up with Pre-visit Labs.  Glade Lloyd, MD [facility] [address] [place], [place] [address] Phone: [phone]  Fax: [phone]    10/21/2019, 1:12 PM  This note was partially dictated with voice recognition software. Similar sounding words can be transcribed inadequately or may not  be corrected upon review.

## 2019-10-22 DIAGNOSIS — K529 Noninfective gastroenteritis and colitis, unspecified: Secondary | ICD-10-CM | POA: Diagnosis not present

## 2019-10-27 ENCOUNTER — Other Ambulatory Visit: Payer: Self-pay | Admitting: Physician Assistant

## 2019-10-28 ENCOUNTER — Other Ambulatory Visit: Payer: Self-pay | Admitting: Cardiovascular Disease

## 2019-10-29 ENCOUNTER — Other Ambulatory Visit: Payer: Self-pay | Admitting: Family Medicine

## 2019-10-29 DIAGNOSIS — R69 Illness, unspecified: Secondary | ICD-10-CM | POA: Diagnosis not present

## 2019-10-29 NOTE — Telephone Encounter (Signed)
Will route to Endo since they have taken over DM care

## 2019-11-02 NOTE — Progress Notes (Signed)
Date:  11/03/2019   ID:  Tracey [name], DOB Jan 17, 1936, MRN [id number]  Patient Location:  [address] [place] [address]   Provider location:   [facility] [place], [place] office  PCP:  Abner Greenspan, MD  Cardiologist:  Patsy Baltimore  Chief Complaint  Patient presents with  . office visit    6 month F/U; Meds verbally reviewed with patient.    History of Present Illness:    Tracey [name] is a 84 y.o. female  past medical history of coronary artery disease, bypass surgery in 1995,  diabetes with hemoglobin A1c 7.0,  stent to her graft to the OM several years ago with chest pain January 2013  repeat stenting to the graft to the OM with a Xience 3.5 x 18 mm stent (patient and husband report that she required delivery of a shock for resuscitation during the procedure),  left renal PCI in 1998, redo PCI March 2010 followed by [name],  who presents for routine followup of her coronary artery disease  She lost her husband in 2016  Trouble with hips, Needs knee replacement, this was delayed secondary to Covid No regular exercise Walks in house  Lots of diarrhea Weight loss, down 10 pounds in 1 year Decreased dose of metformin, stools better  Reports having episode of near syncope Last episode 3-4 weeks ago, while having dinner, dizzy while sitting, Etiology unclear Previous monitor several months ago no significant arrhythmia Denies regular orthostasis symptoms  Followed by [name] 08/25/2020, vascular procedure reviewed and discussed with her in detail Atherosclerotic occlusive disease bilateral lower extremities with rest pain and lifestyle limiting claudication; recurrent left renal artery stenosis; renovascular hypertension  Percutaneous transluminal angioplasty and stent placement pararenal aorta   Percutaneous transluminal angioplasty and stent placement left renal artery  Lab work reviewed HBA1C 8.1 down to 6.4  EKG personally reviewed by  myself on todays visit Shows sinus bradycardia rate 56 bpm no significant ST-T wave changes   Prior CV studies:   The following studies were reviewed today:  Carotid Carotid artery ultrasound showed 1-39% RICA stenosis and 123456 LICA stenosis.   Echo: Normal EF 65% RVSP 32 Mild to moderate MR   Past Medical History:  Diagnosis Date  . Anxiety   . Arthritis   . Atrial fibrillation ([place])    history  . Coronary artery disease   . Diabetes mellitus    non insulin dependent  . GERD (gastroesophageal reflux disease)   . Heart murmur   . Hyperlipidemia   . Hypertension   . Peripheral vascular disease ([place])   . UTI (urinary tract infection)    Past Surgical History:  Procedure Laterality Date  . ABDOMINAL AORTA STENT    . APPENDECTOMY    . BREAST BIOPSY Left    20 years ago  . BREAST BIOPSY Right 2008  . BREAST EXCISIONAL BIOPSY     rt 2008 lt "20 years ago"  . CARDIAC CATHETERIZATION    . CATARACT EXTRACTION W/PHACO Left 08/15/2015   Procedure: CATARACT EXTRACTION PHACO AND INTRAOCULAR LENS PLACEMENT (IOC);  Surgeon: Estill Cotta, MD;  Location: [facility] ORS;  Service: Ophthalmology;  Laterality: Left;  [place] 01:28 AP% 26.4 CDE 35.73 fluid pack # [id number]  . COLONOSCOPY WITH PROPOFOL N/A 01/16/2016   Procedure: COLONOSCOPY WITH PROPOFOL;  Surgeon: Manya Silvas, MD;  Location: [facility] ENDOSCOPY;  Service: Endoscopy;  Laterality: N/A;  . CORONARY ANGIOPLASTY WITH STENT PLACEMENT    .  CORONARY ARTERY BYPASS GRAFT     with LIMA  to the LAD, SVG to OM1, SVG to PDA  . ESOPHAGOGASTRODUODENOSCOPY (EGD) WITH PROPOFOL N/A 01/16/2016   Procedure: ESOPHAGOGASTRODUODENOSCOPY (EGD) WITH PROPOFOL;  Surgeon: Manya Silvas, MD;  Location: [facility] ENDOSCOPY;  Service: Endoscopy;  Laterality: N/A;  . LOWER EXTREMITY ANGIOGRAPHY N/A 07/08/2019   Procedure: Abdominal Aortagraphy;  Surgeon: Algernon Huxley, MD;  Location: [facility] CV LAB;  Service: Cardiovascular;  Laterality: N/A;  . RENAL  ANGIOGRAPHY Left 08/26/2019   Procedure: RENAL ANGIOGRAPHY;  Surgeon: Algernon Huxley, MD;  Location: [facility] LAB;  Service: Cardiovascular;  Laterality: Left;  . RENAL ARTERY STENT     left, By Dr Hulda Humphrey  . REPLACEMENT TOTAL KNEE  03/25/2013   right  . TONSILLECTOMY       Current Meds  Medication Sig  . acetaminophen (TYLENOL) 500 MG tablet Take 1,000 mg by mouth every 6 (six) hours as needed for moderate pain or headache.  Marland Kitchen aspirin 81 MG tablet Take 81 mg by mouth daily.   . carvedilol (COREG) 3.125 MG tablet Take 1 tablet (3.125 mg total) by mouth 2 (two) times daily.  . clopidogrel (PLAVIX) 75 MG tablet Take 1 tablet (75 mg total) by mouth daily.  Marland Kitchen conjugated estrogens (PREMARIN) vaginal cream Apply 1 applicator (0.5 mg) vaginally 2-3 times weekly.  Marland Kitchen glipiZIDE (GLUCOTROL XL) 2.5 MG 24 hr tablet TAKE 1 TABLET (2.5 MG TOTAL) BY MOUTH DAILY WITH BREAKFAST.  Marland Kitchen lisinopril (ZESTRIL) 10 MG tablet Take 1 tablet (10 mg total) by mouth daily.  . metFORMIN (GLUCOPHAGE-XR) 500 MG 24 hr tablet Take 1 tablet (500 mg total) by mouth daily after breakfast.  . nitroGLYCERIN (NITROSTAT) 0.4 MG SL tablet Place 1 tablet (0.4 mg total) under the tongue every 5 (five) minutes as needed for chest pain.  [name] Lancets 99991111 MISC USE AS INSTRUCTED TO TEST BLOOD SUGAR TWICE DAILY AS NEEDED (DX. E08.00)  . ONETOUCH ULTRA test strip USE AS DIRECTED TO CHECK BLOOD SUGAR TWICE DAILY AND AS NEEDED  . pantoprazole (PROTONIX) 40 MG tablet Take 1 tablet (40 mg total) by mouth daily.  . Probiotic Product (ALIGN) 4 MG CAPS Take 1 capsule by mouth daily.  . rosuvastatin (CRESTOR) 40 MG tablet Take 1 tablet (40 mg total) by mouth daily.  . [DISCONTINUED] carvedilol (COREG) 3.125 MG tablet TAKE 1 TABLET BY MOUTH TWICE A DAY  . [DISCONTINUED] clopidogrel (PLAVIX) 75 MG tablet TAKE 1 TABLET BY MOUTH EVERY DAY  . [DISCONTINUED] lisinopril (ZESTRIL) 10 MG tablet TAKE 1 TABLET BY MOUTH EVERY DAY  . [DISCONTINUED]  rosuvastatin (CRESTOR) 40 MG tablet TAKE 1 TABLET BY MOUTH DAILY. PLEASE CALL [phone] TO SCHEDULE AN APPT FOR FURTHER REFILLS  . [DISCONTINUED] rosuvastatin (CRESTOR) 40 MG tablet Take 1 tablet (40 mg total) by mouth daily.     Allergies:   Ciprofloxacin, Codeine, Erythromycin, and Tramadol    Family Hx: The patient's family history includes Cancer in her brother; Heart disease in her father; Hyperlipidemia in her daughter; Other in her brother, brother, and brother. There is no history of Breast cancer, Kidney cancer, or Bladder Cancer.  ROS:   Please see the history of present illness.    Review of Systems  Constitutional: Negative.   HENT: Negative.   Respiratory: Negative.   Cardiovascular: Negative.   Gastrointestinal: Negative.   Musculoskeletal: Negative.   Neurological: Positive for dizziness.  Psychiatric/Behavioral: Negative.   All other systems reviewed and  are negative.    Labs/Other Tests and Data Reviewed:    Recent Labs: 03/05/2019: ALT 10; Hemoglobin 10.2; Platelets 193.0; Potassium 4.5; Sodium 141; TSH 2.68 08/26/2019: BUN 31; Creatinine, Ser 1.30   Recent Lipid Panel Lab Results  Component Value Date/Time   CHOL 161 03/05/2019 09:16 AM   CHOL 141 10/08/2011 04:57 AM   TRIG 145.0 03/05/2019 09:16 AM   TRIG 179 10/08/2011 04:57 AM   HDL 46.90 03/05/2019 09:16 AM   HDL 37 (L) 10/08/2011 04:57 AM   CHOLHDL 3 03/05/2019 09:16 AM   LDLCALC 85 03/05/2019 09:16 AM   LDLCALC 68 10/08/2011 04:57 AM    Wt Readings from Last 3 Encounters:  11/03/19 123 lb 8 oz (56 kg)  10/21/19 123 lb 6.4 oz (56 kg)  10/08/19 126 lb (57.2 kg)     Exam:    BP 124/60 (BP Location: Left Arm, Patient Position: Sitting, Cuff Size: Normal)   Pulse (!) 56   Ht 5\' 3"  (1.6 m)   Wt 123 lb 8 oz (56 kg)   SpO2 98%   BMI 21.88 kg/m   Constitutional:  oriented to person, place, and time. No distress.  HENT:  Head: Grossly normal Eyes:  no discharge. No scleral icterus.   Neck: No JVD, no carotid bruits  Cardiovascular: Regular rate and rhythm, no murmurs appreciated Pulmonary/Chest: Clear to auscultation bilaterally, no wheezes or rails Abdominal: Soft.  no distension.  no tenderness.  Musculoskeletal: Normal range of motion Neurological:  normal muscle tone. Coordination normal. No atrophy Skin: Skin warm and dry Psychiatric: normal affect, pleasant   ASSESSMENT & PLAN:    Supraventricular tachycardia Denies having any significant arrhythmia  Syncope, unspecified syncope type Remote episode, vasovagal suspected in the setting of GI upset Episode of near syncope 3 to 4 weeks ago while sitting, unclear GI etiology triggering the episode She has been having diarrhea from high-dose Metformin, improved symptoms with low Metformin Recommend she stay hydrated, monitor orthostatics at home, call us for any further episodes  PAD (peripheral artery disease) (HCC) Significant aortic disease, renal artery disease Recent intervention by [name], discussed with her  Diabetes type 2 with complications Weight has dropped 10 pounds over the past year or 2, A1c lower   Total encounter time more than 25 minutes  Greater than 50% was spent in counseling and coordination of care with the patient   Signed, Ida Rogue, MD  11/03/2019 1:13 PM    [place] [facility] [address], [place], [place] [id number]

## 2019-11-03 ENCOUNTER — Other Ambulatory Visit: Payer: Self-pay

## 2019-11-03 ENCOUNTER — Encounter: Payer: Self-pay | Admitting: Cardiovascular Disease

## 2019-11-03 ENCOUNTER — Ambulatory Visit: Payer: Medicare HMO | Admitting: Cardiovascular Disease

## 2019-11-03 VITALS — BP 124/60 | HR 56 | Ht 63.0 in | Wt 123.5 lb

## 2019-11-03 DIAGNOSIS — E0800 Diabetes mellitus due to underlying condition with hyperosmolarity without nonketotic hyperglycemic-hyperosmolar coma (NKHHC): Secondary | ICD-10-CM | POA: Diagnosis not present

## 2019-11-03 DIAGNOSIS — I739 Peripheral vascular disease, unspecified: Secondary | ICD-10-CM | POA: Diagnosis not present

## 2019-11-03 DIAGNOSIS — I700 Atherosclerosis of aorta: Secondary | ICD-10-CM | POA: Diagnosis not present

## 2019-11-03 DIAGNOSIS — I471 Supraventricular tachycardia: Secondary | ICD-10-CM

## 2019-11-03 DIAGNOSIS — R55 Syncope and collapse: Secondary | ICD-10-CM | POA: Diagnosis not present

## 2019-11-03 DIAGNOSIS — I25118 Atherosclerotic heart disease of native coronary artery with other forms of angina pectoris: Secondary | ICD-10-CM

## 2019-11-03 DIAGNOSIS — I701 Atherosclerosis of renal artery: Secondary | ICD-10-CM | POA: Diagnosis not present

## 2019-11-03 DIAGNOSIS — R002 Palpitations: Secondary | ICD-10-CM

## 2019-11-03 MED ORDER — CLOPIDOGREL BISULFATE 75 MG PO TABS: 75.0000 mg | ORAL_TABLET | Freq: Every day | ORAL | 3 refills | Status: DC

## 2019-11-03 MED ORDER — ROSUVASTATIN CALCIUM 40 MG PO TABS: 40.0000 mg | ORAL_TABLET | Freq: Every day | ORAL | 3 refills | Status: DC

## 2019-11-03 MED ORDER — ROSUVASTATIN CALCIUM 40 MG PO TABS: 40.0000 mg | ORAL_TABLET | Freq: Every day | ORAL | 4 refills | Status: DC

## 2019-11-03 MED ORDER — LISINOPRIL 10 MG PO TABS: 10.0000 mg | ORAL_TABLET | Freq: Every day | ORAL | 3 refills | Status: DC

## 2019-11-03 MED ORDER — CARVEDILOL 3.125 MG PO TABS: 3.1250 mg | ORAL_TABLET | Freq: Two times a day (BID) | ORAL | 3 refills | Status: DC

## 2019-11-03 NOTE — Patient Instructions (Addendum)
Monitor blood pressure with standing For more dizzy spells , call the office Unclear if this is blood pressure drop or heart arrhythmia   Medication Instructions:  REFILLS on cardiac meds  If you need a refill on your cardiac medications before your next appointment, please call your pharmacy.    Lab work: No new labs needed   If you have labs (blood work) drawn today and your tests are completely normal, you will receive your results only by: Marland Kitchen MyChart Message (if you have MyChart) OR . A paper copy in the mail If you have any lab test that is abnormal or we need to change your treatment, we will call you to review the results.   Testing/Procedures: No new testing needed   Follow-Up: At [facility], you and your health needs are our priority.  As part of our continuing mission to provide you with exceptional heart care, we have created designated Provider Care Teams.  These Care Teams include your primary Cardiologist (physician) and Advanced Practice Providers (APPs -  Physician Assistants and Nurse Practitioners) who all work together to provide you with the care you need, when you need it.  . You will need a follow up appointment in 12 months  . Providers on your designated Care Team:   . Murray Hodgkins, NP . Christell Faith, PA-C . Marrianne Mood, PA-C  Any Other Special Instructions Will Be Listed Below (If Applicable).  For educational health videos Log in to : [email or web address] Or : [email or web address], password : [email or web address]   How to Take Your Blood Pressure You can take your blood pressure at home with a machine. You may need to check your blood pressure at home:  To check if you have high blood pressure (hypertension).  To check your blood pressure over time.  To make sure your blood pressure medicine is working. Supplies needed: You will need a blood pressure machine, or monitor. You can buy one at a drugstore or online. When choosing one:  Choose one with an arm  cuff.  Choose one that wraps around your upper arm. Only one finger should fit between your arm and the cuff.  Do not choose one that measures your blood pressure from your wrist or finger. Your doctor can suggest a monitor. How to prepare Avoid these things for 30 minutes before checking your blood pressure:  Drinking caffeine.  Drinking alcohol.  Eating.  Smoking.  Exercising. Five minutes before checking your blood pressure:  Pee.  Sit in a dining chair. Avoid sitting in a soft couch or armchair.  Be quiet. Do not talk. How to take your blood pressure Follow the instructions that came with your machine. If you have a digital blood pressure monitor, these may be the instructions: 1. Sit up straight. 2. Place your feet on the floor. Do not cross your ankles or legs. 3. Rest your left arm at the level of your heart. You may rest it on a table, desk, or chair. 4. Pull up your shirt sleeve. 5. Wrap the blood pressure cuff around the upper part of your left arm. The cuff should be 1 inch (2.5 cm) above your elbow. It is best to wrap the cuff around bare skin. 6. Fit the cuff snugly around your arm. You should be able to place only one finger between the cuff and your arm. 7. Put the cord inside the groove of your elbow. 8. Press the power button. 9. Sit quietly while the cuff fills with air and  loses air. 10. Write down the numbers on the screen. 11. Wait 2-3 minutes and then repeat steps 1-10. What do the numbers mean? Two numbers make up your blood pressure. The first number is called systolic pressure. The second is called diastolic pressure. An example of a blood pressure reading is "120 over 80" (or 120/80). If you are an adult and do not have a medical condition, use this guide to find out if your blood pressure is normal: Normal  First number: below 120.  Second number: below 80. Elevated  First number: 120-129.  Second number: below 80. Hypertension stage  1  First number: 130-139.  Second number: 80-89. Hypertension stage 2  First number: 140 or above.  Second number: 52 or above. Your blood pressure is above normal even if only the top or bottom number is above normal. Follow these instructions at home:  Check your blood pressure as often as your doctor tells you to.  Take your monitor to your next doctor's appointment. Your doctor will: ? Make sure you are using it correctly. ? Make sure it is working right.  Make sure you understand what your blood pressure numbers should be.  Tell your doctor if your medicines are causing side effects. Contact a doctor if:  Your blood pressure keeps being high. Get help right away if:  Your first blood pressure number is higher than 180.  Your second blood pressure number is higher than 120. This information is not intended to replace advice given to you by your health care provider. Make sure you discuss any questions you have with your health care provider. Document Revised: 08/16/2017 Document Reviewed: 02/10/2016 Elsevier Patient Education  2020 [employer].    Orthostatic Hypotension Blood pressure is a measurement of how strongly, or weakly, your blood is pressing against the walls of your arteries. Orthostatic hypotension is a sudden drop in blood pressure that happens when you quickly change positions, such as when you get up from sitting or lying down. Arteries are blood vessels that carry blood from your heart throughout your body. When blood pressure is too low, you may not get enough blood to your brain or to the rest of your organs. This can cause weakness, light-headedness, rapid heartbeat, and fainting. This can last for just a few seconds or for up to a few minutes. Orthostatic hypotension is usually not a serious problem. However, if it happens frequently or gets worse, it may be a sign of something more serious. What are the causes? This condition may be caused  by:  Sudden changes in posture, such as standing up quickly after you have been sitting or lying down.  Blood loss.  Loss of body fluids (dehydration).  Heart problems.  Hormone (endocrine) problems.  Pregnancy.  Severe infection.  Lack of certain nutrients.  Severe allergic reactions (anaphylaxis).  Certain medicines, such as blood pressure medicine or medicines that make the body lose excess fluids (diuretics). Sometimes, this condition can be caused by not taking medicine as directed, such as taking too much of a certain medicine. What increases the risk? The following factors may make you more likely to develop this condition:  Age. Risk increases as you get older.  Conditions that affect the heart or the central nervous system.  Taking certain medicines, such as blood pressure medicine or diuretics.  Being pregnant. What are the signs or symptoms? Symptoms of this condition may include:  Weakness.  Light-headedness.  Dizziness.  Blurred vision.  Fatigue.  Rapid heartbeat.  Fainting, in severe cases. How is this diagnosed? This condition is diagnosed based on:  Your medical history.  Your symptoms.  Your blood pressure measurement. Your health care provider will check your blood pressure when you are: ? Lying down. ? Sitting. ? Standing. A blood pressure reading is recorded as two numbers, such as "120 over 80" (or 120/80). The first ("top") number is called the systolic pressure. It is a measure of the pressure in your arteries as your heart beats. The second ("bottom") number is called the diastolic pressure. It is a measure of the pressure in your arteries when your heart relaxes between beats. Blood pressure is measured in a unit called mm Hg. Healthy blood pressure for most adults is 120/80. If your blood pressure is below 90/60, you may be diagnosed with hypotension. Other information or tests that may be used to diagnose orthostatic hypotension  include:  Your other vital signs, such as your heart rate and temperature.  Blood tests.  Tilt table test. For this test, you will be safely secured to a table that moves you from a lying position to an upright position. Your heart rhythm and blood pressure will be monitored during the test. How is this treated? This condition may be treated by:  Changing your diet. This may involve eating more salt (sodium) or drinking more water.  Taking medicines to raise your blood pressure.  Changing the dosage of certain medicines you are taking that might be lowering your blood pressure.  Wearing compression stockings. These stockings help to prevent blood clots and reduce swelling in your legs. In some cases, you may need to go to the hospital for:  Fluid replacement. This means you will receive fluids through an IV.  Blood replacement. This means you will receive donated blood through an IV (transfusion).  Treating an infection or heart problems, if this applies.  Monitoring. You may need to be monitored while medicines that you are taking wear off. Follow these instructions at home: Eating and drinking   Drink enough fluid to keep your urine pale yellow.  Eat a healthy diet, and follow instructions from your health care provider about eating or drinking restrictions. A healthy diet includes: ? Fresh fruits and vegetables. ? Whole grains. ? Lean meats. ? Low-fat dairy products.  Eat extra salt only as directed. Do not add extra salt to your diet unless your health care provider told you to do that.  Eat frequent, small meals.  Avoid standing up suddenly after eating. Medicines  Take over-the-counter and prescription medicines only as told by your health care provider. ? Follow instructions from your health care provider about changing the dosage of your current medicines, if this applies. ? Do not stop or adjust any of your medicines on your own. General instructions   Wear  compression stockings as told by your health care provider.  Get up slowly from lying down or sitting positions. This gives your blood pressure a chance to adjust.  Avoid hot showers and excessive heat as directed by your health care provider.  Return to your normal activities as told by your health care provider. Ask your health care provider what activities are safe for you.  Do not use any products that contain nicotine or tobacco, such as cigarettes, e-cigarettes, and chewing tobacco. If you need help quitting, ask your health care provider.  Keep all follow-up visits as told by your health care provider. This is important. Contact a health care  provider if you:  Vomit.  Have diarrhea.  Have a fever for more than 2-3 days.  Feel more thirsty than usual.  Feel weak and tired. Get help right away if you:  Have chest pain.  Have a fast or irregular heartbeat.  Develop numbness in any part of your body.  Cannot move your arms or your legs.  Have trouble speaking.  Become sweaty or feel light-headed.  Faint.  Feel short of breath.  Have trouble staying awake.  Feel confused. Summary  Orthostatic hypotension is a sudden drop in blood pressure that happens when you quickly change positions.  Orthostatic hypotension is usually not a serious problem.  It is diagnosed by having your blood pressure taken lying down, sitting, and then standing.  It may be treated by changing your diet or adjusting your medicines. This information is not intended to replace advice given to you by your health care provider. Make sure you discuss any questions you have with your health care provider. Document Revised: 02/27/2018 Document Reviewed: 02/27/2018 Elsevier Patient Education  [place].    Postural Orthostatic Tachycardia Syndrome Postural orthostatic tachycardia syndrome (POTS) is a group of symptoms that occur when a person stands up after lying down. POTS occurs  when less blood than normal flows to the body when you stand up. The reduced blood flow to the body makes the heart beat rapidly. POTS may be associated with another medical condition, or it may occur on its own. What are the causes? The cause of this condition is not known, but many conditions and diseases are associated with it. What increases the risk? This condition is more likely to develop in:  Women 24-56 years old.  Women who are pregnant.  Women who are in their period (menstruating).  People who have certain conditions, such as: ? Infection from a virus. ? Attacks of healthy organs by the body's immunity (autoimmune disease). ? Losing a lot of red blood cells (anemia). ? Losing too much water in the body (dehydration). ? An overactive thyroid (hyperthyroidism).  People who take certain medicines.  People who have had a major injury.  People who have had surgery. What are the signs or symptoms? The most common symptom of this condition is light-headedness when one stands from a lying or sitting position. Other symptoms may include:  Feeling a rapid increase in the heartbeat (tachycardia) within 10 minutes of standing up.  Fainting.  Weakness.  Confusion.  Trembling.  Shortness of breath.  Sweating or flushing.  Headache.  Chest pain.  Breathing that is deeper and faster than normal (hyperventilation).  Nausea.  Anxiety. Symptoms may be worse in the morning, and they may be relieved by lying down. How is this diagnosed? This condition is diagnosed based on:  Your symptoms.  Your medical history.  A physical exam.  Checking your heart rate when you are lying down and after you stand up.  Checking your blood pressure when you go from lying down to standing up.  Blood tests to measure hormones that change with blood pressure. The blood tests will be done when you are lying down and when you are standing up. You may have other tests to check for  conditions or diseases that are associated with POTS. How is this treated? Treatment for this condition depends on how severe your symptoms are and whether you have any conditions or diseases that are associated with POTS. Treatment may involve:  Treating any conditions or diseases that are  associated with POTS.  Drinking two glasses of water before getting up from a lying position.  Eating more salt (sodium).  Taking medicine to control blood pressure and heart rate (beta-blocker).  Avoiding certain medicines.  Starting an exercise program under the supervision of a health care provider. Follow these instructions at home: Medicines  Take over-the-counter and prescription medicines only as told by your health care provider.  Let your health care provider know about all prescription or over-the-counter medicines. These include herbs, vitamins, and supplements. You may need to stop or adjust some medicines if they cause this condition.  Talk with your health care provider before starting any new medicines. Eating and drinking   Drink enough fluid to keep your urine pale yellow.  If told by your health care provider, drink two glasses of water before getting up from a lying position.  Follow instructions from your health care provider about how much sodium you should eat.  Avoid heavy meals. Eat several small meals a day instead of a few large meals. General instructions  Do an aerobic exercise for 20 minutes a day, at least 3 days a week.  Ask your health care provider what kinds of exercise are safe for you.  Do not use any products that contain nicotine or tobacco, such as cigarettes and e-cigarettes. These can interfere with blood flow. If you need help quitting, ask your health care provider.  Keep all follow-up visits as told by your health care provider. This is important. Contact a health care provider if:  Your symptoms do not improve after treatment.  Your  symptoms get worse.  You develop new symptoms. Get help right away if:  You have chest pain.  You have difficulty breathing.  You have fainting episodes. These symptoms may represent a serious problem that is an emergency. Do not wait to see if the symptoms will go away. Get medical help right away. Call your local emergency services (911 in the [place] Do not drive yourself to the hospital. Summary  POTS is a condition that can cause light-headedness, fainting, and palpitations when you go from a sitting or lying position to a standing position. It occurs when less blood than normal flows to the body when you stand up.  Treatment for this condition includes treating any underlying conditions, drinking plenty of water, stopping or changing some medicines, or starting an exercise program.  Get help right away if you have chest pain, difficulty breathing, or fainting episodes. These may represent a serious problem that is an emergency. This information is not intended to replace advice given to you by your health care provider. Make sure you discuss any questions you have with your health care provider. Document Revised: 10/15/2017 Document Reviewed: 10/15/2017 Elsevier Patient Education  2020 [employer].

## 2019-11-04 ENCOUNTER — Other Ambulatory Visit: Payer: Self-pay | Admitting: Family Medicine

## 2019-11-13 DIAGNOSIS — M1712 Unilateral primary osteoarthritis, left knee: Secondary | ICD-10-CM | POA: Diagnosis not present

## 2019-11-17 DIAGNOSIS — M1712 Unilateral primary osteoarthritis, left knee: Secondary | ICD-10-CM | POA: Diagnosis not present

## 2019-11-21 ENCOUNTER — Other Ambulatory Visit: Payer: Self-pay | Admitting: Family Medicine

## 2019-11-23 NOTE — Telephone Encounter (Signed)
Will route to Endo who is managing DM care

## 2019-11-24 DIAGNOSIS — R69 Illness, unspecified: Secondary | ICD-10-CM | POA: Diagnosis not present

## 2019-11-30 ENCOUNTER — Other Ambulatory Visit: Payer: Self-pay | Admitting: "Endocrinology

## 2019-12-01 NOTE — Telephone Encounter (Signed)
Pharmacy received rx refill.

## 2019-12-01 NOTE — Telephone Encounter (Signed)
Pharmacy is calling and stating the pt is out of glipizide.

## 2019-12-10 ENCOUNTER — Other Ambulatory Visit (INDEPENDENT_AMBULATORY_CARE_PROVIDER_SITE_OTHER): Payer: Medicare HMO

## 2019-12-10 ENCOUNTER — Other Ambulatory Visit: Payer: Self-pay

## 2019-12-10 ENCOUNTER — Telehealth: Payer: Self-pay

## 2019-12-10 DIAGNOSIS — N182 Chronic kidney disease, stage 2 (mild): Secondary | ICD-10-CM

## 2019-12-10 DIAGNOSIS — E1122 Type 2 diabetes mellitus with diabetic chronic kidney disease: Secondary | ICD-10-CM | POA: Diagnosis not present

## 2019-12-10 LAB — POCT GLYCOSYLATED HEMOGLOBIN (HGB A1C): Hemoglobin A1C: 6.4 % — AB (ref 4.0–5.6)

## 2019-12-10 NOTE — Telephone Encounter (Signed)
I assume poct?   I will have it ordered for her

## 2019-12-10 NOTE — Telephone Encounter (Signed)
Leafy Ro wants to know if pt can come this afternoon to have A1C checked; pt is to have knee replacement surgery in next few wakes and recommending an A1C updated level. Please let [name] pts granddaughter if this can be arranged.

## 2019-12-10 NOTE — Telephone Encounter (Signed)
[name] notified A1c ordered and to schedule a lab appt whenever pt wants to have it checked

## 2019-12-15 ENCOUNTER — Ambulatory Visit (INDEPENDENT_AMBULATORY_CARE_PROVIDER_SITE_OTHER): Payer: Medicare HMO | Admitting: Vascular Surgery

## 2019-12-15 ENCOUNTER — Encounter (INDEPENDENT_AMBULATORY_CARE_PROVIDER_SITE_OTHER): Payer: Medicare HMO

## 2019-12-17 ENCOUNTER — Encounter: Payer: Medicare HMO | Admitting: Obstetrics and Gynecology

## 2019-12-18 ENCOUNTER — Other Ambulatory Visit: Payer: Self-pay | Admitting: "Endocrinology

## 2019-12-21 NOTE — Discharge Instructions (Signed)
Instructions after Total Knee Replacement   Tracey [name], Jr., M.D.     Dept. of Orthopaedics & Sports Medicine  [facility]  [address]  [place], [place]  [address]  Phone: [phone]   Fax: [phone]    DIET: Drink plenty of non-alcoholic fluids. Resume your normal diet. Include foods high in fiber.  ACTIVITY:  You may use crutches or a walker with weight-bearing as tolerated, unless instructed otherwise. You may be weaned off of the walker or crutches by your Physical Therapist.  Do NOT place pillows under the knee. Anything placed under the knee could limit your ability to straighten the knee.   Continue doing gentle exercises. Exercising will reduce the pain and swelling, increase motion, and prevent muscle weakness.   Please continue to use the TED compression stockings for 6 weeks. You may remove the stockings at night, but should reapply them in the morning. Do not drive or operate any equipment until instructed.  WOUND CARE:  Continue to use the PolarCare or ice packs periodically to reduce pain and swelling. You may bathe or shower after the staples are removed at the first office visit following surgery.  MEDICATIONS: You may resume your regular medications. Please take the pain medication as prescribed on the medication. Do not take pain medication on an empty stomach. You have been given a prescription for a blood thinner (Lovenox or Coumadin). Please take the medication as instructed. (NOTE: After completing a 2 week course of Lovenox, take one Enteric-coated aspirin once a day. This along with elevation will help reduce the possibility of phlebitis in your operated leg.) Do not drive or drink alcoholic beverages when taking pain medications.  CALL THE OFFICE FOR: Temperature above 101 degrees Excessive bleeding or drainage on the dressing. Excessive swelling, coldness, or paleness of the toes. Persistent nausea and vomiting.  FOLLOW-UP:  You  should have an appointment to return to the office in 10-14 days after surgery. Arrangements have been made for continuation of Physical Therapy (either home therapy or outpatient therapy).   [facility] Department Directory         [email or web address]       [email or web address]          Cardiology  Appointments: [place] - [phone] [place] - [phone]  Endocrinology  Appointments: [place] - [phone] [place] - [phone]  Gastroenterology  Appointments: [place] - [phone] [place] - [phone]        General Surgery   Appointments: [place] - [phone]  Internal Medicine/Family Medicine  Appointments: [place] - [phone] [place] - [phone] [place] - [phone]  Metabolic and Weigh Loss Surgery  Appointments: [place] - [phone]        Neurology  Appointments: [place] - [phone] [place] - [phone]  Neurosurgery  Appointments: [place] - [phone]  Obstetrics & Gynecology  Appointments: [place] - [phone] [place] - [phone]        Pediatrics  Appointments: [place] - [phone] [place] - [phone]  Physiatry  Appointments: [place] -[phone]  Physical Therapy  Appointments: [place] - [phone] [place] - [phone]        Podiatry  Appointments: [place] - [phone] [place] - [phone]  Pulmonology  Appointments: [place] - [phone]  Rheumatology  Appointments: [place] - [phone]        [place] Location: [facility]  [address] [place], [place]  [address]  [place] Location: [facility] [address] [place], [place]  [address]  [place] Location: [facility] [address] [place], [place]  [address]    

## 2019-12-22 ENCOUNTER — Encounter: Payer: Medicare HMO | Admitting: Obstetrics and Gynecology

## 2019-12-25 ENCOUNTER — Other Ambulatory Visit: Payer: Self-pay

## 2019-12-25 ENCOUNTER — Encounter
Admission: RE | Admit: 2019-12-25 | Discharge: 2019-12-25 | Disposition: A | Discharge status: Home / Self Care | Payer: Medicare HMO | Source: Ambulatory Visit | Attending: Orthopedic Surgery | Admitting: Orthopedic Surgery

## 2019-12-25 DIAGNOSIS — Z01818 Encounter for other preprocedural examination: Secondary | ICD-10-CM | POA: Insufficient documentation

## 2019-12-25 HISTORY — DX: Rectocele: N81.6

## 2019-12-25 HISTORY — DX: Chronic kidney disease, unspecified: N18.9

## 2019-12-25 HISTORY — DX: Cardiac arrhythmia, unspecified: I49.9

## 2019-12-25 HISTORY — DX: Personal history of urinary calculi: Z87.442

## 2019-12-25 NOTE — Patient Instructions (Addendum)
INSTRUCTIONS FOR SURGERY     Your surgery is scheduled for:   Wednesday, April 21ST     To find out your arrival time for the day of surgery,          please call [phone] between 1 pm and 3 pm on :  Tuesday, April 20TH     When you arrive for surgery, report to the [facility].       Do NOT stop on the first floor to register.    REMEMBER: Instructions that are not followed completely may result in serious medical risk,  up to and including death, or upon the discretion of your surgeon and anesthesiologist,            your surgery may need to be rescheduled.  __X__ 1. Do not eat food after midnight the night before your procedure.                    No gum, candy, lozenger, tic tacs, tums or hard candies.                  ABSOLUTELY NOTHING SOLID IN YOUR MOUTH AFTER MIDNIGHT                    You may drink unlimited clear liquids up to 2 hours before you are scheduled to arrive for surgery.                   Do not drink anything within those 2 hours unless you need to take medicine, then take the                   smallest amount you need.  Clear liquids include:  water, apple juice without pulp,                   any flavor Gatorade, Black coffee, black tea.  Sugar may be added but no dairy/ honey /lemon.                        Broth and jello is not considered a clear liquid.  __x__  2. On the morning of surgery, please brush your teeth with toothpaste and water. You may rinse with                  mouthwash if you wish but DO NOT SWALLOW TOOTHPASTE OR MOUTHWASH  __x___ 5. If you start any new medication after this appointment and prior to surgery, please                   Bring it with you on the day of surgery.  ___x__ 6. Notify your doctor if there is any change in your medical condition, such as fever, infection, vomitting,                   Diarrhea or any open sores.  __x___ 7.  USE the CHG  SOAP as instructed, the night before surgery and the day of surgery.  Once you have washed with this soap, do NOT use any of the following: Powders, perfumes                    or lotions. Please do not wear make up, hairpins, clips or nail polish. You may wear deodorant.                   Men may shave their face and neck.  Women need to shave 48 hours prior to surgery.                   DO NOT wear ANY jewelry on the day of surgery. If there are rings that are too tight to                    remove easily, please address this prior to the surgery day. Piercings need to be removed.                                                                     NO METAL ON YOUR BODY.                    Do NOT bring any valuables.  If you came to Pre-Admit testing then you will not need license,                     insurance card or credit card.  If you will be staying overnight, please either leave your things in                     the car or have your family be responsible for these items.                     [facility] IS NOT RESPONSIBLE FOR BELONGINGS OR VALUABLES.  ___X__ 8. DO NOT wear contact lenses on surgery day.  You may not have dentures,                     Hearing aides, contacts or glasses in the operating room. These items can be                    Placed in the Recovery Room to receive immediately after surgery.  ___x__ 10. Take the following medications on the morning of surgery with a sip of water:                              1. CARVEDILOL                     2. PROTONIX                     3.                      _____ 11.  Follow any instructions provided to you by your surgeon.                        Such as enema, clear liquid bowel prep                      ##  PLEASE COMPLETE THE G2 CARB DRINK ON THE MORNING OF SURGERY.                             HAVE IT COMPLETED 2 HOURS PRIOR TO ARRIVING AT [facility]. ##  __X__  12. STOP  PLAVIX  AS OF April 13TH OR 1  WEEK PRIOR TO SURGERY.                    CONTINUE ASPIRIN BUT DO NOT TAKE ON THE MORNING OF SURGERY.                      STOP ALL OTHER ASPIRIN PRODUCTS, THIS INCLUDES                            BC POWDERS / GOODIES POWDER  __x___ 13. STOP Anti-inflammatories as of: April 14TH OR 1 WEEK PRIOR TO SURGERY.                      This includes IBUPROFEN / MOTRIN / ADVIL / ALEVE/ NAPROXYN                    YOU MAY TAKE TYLENOL ANY TIME PRIOR TO SURGERY.  __X___ 61.  Stop supplements until after surgery.                     This includes: CRANBERRY // MULTIVITAMINS // ALIGN PROBIOTICS                 You may continue taking Vitamin B12 / Vitamin D3 but do not take on the morning of surgery.  _X_____16.  Stop Metformin 2 full days prior to surgery.  LAST DOSE: April 18TH                    CONTINUE GLIPIZIDE UP UNTIL THE DAY BEFORE SURGERY.                     Do NOT take any diabetes medications on surgery day.  __X____17.  Continue to take the following medications but do not take on the morning of surgery:                           GLIPIZIDE / METFORMIN / ASPIRIN  __X____18. If staying overnight, please have appropriate shoes to wear to be able to walk around the unit.                   Wear clean and comfortable clothing to the hospital.  CONTINUE TAKING YOUR EVENING MEDICATIONS AS USUAL.  PLAN ON HAVING STOOL SOFTENERS FOR WHEN YOU RETURN HOME.  YOU MIGHT WANT TO     PLAN ON STARTING THEM A DAY OR 2 PRIOR TO SURGERY. PLEASE READ THE JOINT REPLACEMENT HANDBOOK FOR A REFRESHER OF INFORMATION. READ THE MEDICAL ADVANCE DIRECTIVES WORKBOOK AT YOUR LEASURE. WHEN YOU   HAVE COMPLETED IT, THEN HAVE IT NOTARIZED AND IT IS LEGAL.   [facility] CELL PHONE. HAVE PHONE NUMBERS FOR YOUR CONTACT PEOPLE!!

## 2019-12-29 ENCOUNTER — Other Ambulatory Visit
Admission: RE | Admit: 2019-12-29 | Discharge: 2019-12-29 | Disposition: A | Discharge status: Home / Self Care | Payer: Medicare HMO | Source: Ambulatory Visit | Attending: Orthopedic Surgery | Admitting: Orthopedic Surgery

## 2019-12-29 ENCOUNTER — Other Ambulatory Visit: Payer: Self-pay

## 2019-12-29 DIAGNOSIS — Z01812 Encounter for preprocedural laboratory examination: Secondary | ICD-10-CM | POA: Insufficient documentation

## 2019-12-29 LAB — COMPREHENSIVE METABOLIC PANEL
ALT: 13 U/L (ref 0–44)
AST: 21 U/L (ref 15–41)
Albumin: 4.5 g/dL (ref 3.5–5.0)
Alkaline Phosphatase: 47 U/L (ref 38–126)
Anion gap: 7 (ref 5–15)
BUN: 30 mg/dL — ABNORMAL HIGH (ref 8–23)
CO2: 25 mmol/L (ref 22–32)
Calcium: 10.4 mg/dL — ABNORMAL HIGH (ref 8.9–10.3)
Chloride: 108 mmol/L (ref 98–111)
Creatinine, Ser: 0.92 mg/dL (ref 0.44–1.00)
GFR calc Af Amer: >60 mL/min (ref >60)
GFR calc non Af Amer: 58 mL/min — ABNORMAL LOW (ref >60)
Glucose, Bld: 104 mg/dL — ABNORMAL HIGH (ref 70–99)
Potassium: 4 mmol/L (ref 3.5–5.1)
Sodium: 140 mmol/L (ref 135–145)
Total Bilirubin: 0.8 mg/dL (ref 0.3–1.2)
Total Protein: 7.4 g/dL (ref 6.5–8.1)

## 2019-12-29 LAB — PROTIME-INR
INR: 1 (ref 0.8–1.2)
Prothrombin Time: 13.1 seconds (ref 11.4–15.2)

## 2019-12-29 LAB — URINALYSIS, ROUTINE W REFLEX MICROSCOPIC
Bacteria, UA: NONE SEEN
Bilirubin Urine: NEGATIVE
Glucose, UA: NEGATIVE mg/dL
Hgb urine dipstick: NEGATIVE
Ketones, ur: NEGATIVE mg/dL
Leukocytes,Ua: NEGATIVE
Nitrite: NEGATIVE
Protein, ur: 30 mg/dL — AB
Specific Gravity, Urine: 1.02 (ref 1.005–1.030)
pH: 5 (ref 5.0–8.0)

## 2019-12-29 LAB — CBC
HCT: 32.2 % — ABNORMAL LOW (ref 36.0–46.0)
Hemoglobin: 9.6 g/dL — ABNORMAL LOW (ref 12.0–15.0)
MCH: 24.5 pg — ABNORMAL LOW (ref 26.0–34.0)
MCHC: 29.8 g/dL — ABNORMAL LOW (ref 30.0–36.0)
MCV: 82.1 fL (ref 80.0–100.0)
Platelets: 174 10*3/uL (ref 150–400)
RBC: 3.92 MIL/uL (ref 3.87–5.11)
RDW: 15.6 % — ABNORMAL HIGH (ref 11.5–15.5)
WBC: 5.2 10*3/uL (ref 4.0–10.5)
nRBC: 0 % (ref 0.0–0.2)

## 2019-12-29 LAB — TYPE AND SCREEN
ABO/RH(D): A POS
Antibody Screen: NEGATIVE

## 2019-12-29 LAB — SURGICAL PCR SCREEN
MRSA, PCR: NEGATIVE
Staphylococcus aureus: NEGATIVE

## 2019-12-29 LAB — SEDIMENTATION RATE: Sed Rate: 28 mm/hr (ref 0–30)

## 2019-12-29 LAB — C-REACTIVE PROTEIN: CRP: 0.6 mg/dL (ref <1.0)

## 2019-12-29 LAB — APTT: aPTT: 30 seconds (ref 24–36)

## 2019-12-30 LAB — URINE CULTURE
Culture: NO GROWTH
Special Requests: NORMAL

## 2019-12-30 LAB — HEMOGLOBIN A1C
Hgb A1c MFr Bld: 6.8 % — ABNORMAL HIGH (ref 4.8–5.6)
Mean Plasma Glucose: 148 mg/dL

## 2019-12-30 NOTE — Pre-Procedure Instructions (Signed)
Pre-Admit Testing Provider Notification Note  Provider Notified: [facility]  Notification Mode: Fax  Reason: Abnormal lab result.  Response: Fax confirmation received.   Additional Information: Placed on chart. Noted on Pre-Admit Worksheet.  Signed: Taci Sterling, RN  

## 2020-01-01 ENCOUNTER — Ambulatory Visit (INDEPENDENT_AMBULATORY_CARE_PROVIDER_SITE_OTHER): Payer: Medicare HMO | Admitting: Vascular Surgery

## 2020-01-01 ENCOUNTER — Encounter (INDEPENDENT_AMBULATORY_CARE_PROVIDER_SITE_OTHER): Payer: Medicare HMO

## 2020-01-04 ENCOUNTER — Other Ambulatory Visit
Admission: RE | Admit: 2020-01-04 | Discharge: 2020-01-04 | Disposition: A | Discharge status: Home / Self Care | Payer: Medicare HMO | Source: Ambulatory Visit | Attending: Orthopedic Surgery | Admitting: Orthopedic Surgery

## 2020-01-04 ENCOUNTER — Other Ambulatory Visit: Payer: Self-pay

## 2020-01-04 LAB — SARS CORONAVIRUS 2 (TAT 6-24 HRS): SARS Coronavirus 2: NEGATIVE

## 2020-01-05 DIAGNOSIS — R69 Illness, unspecified: Secondary | ICD-10-CM | POA: Diagnosis not present

## 2020-01-06 ENCOUNTER — Inpatient Hospital Stay
Admission: RE | Admit: 2020-01-06 | Discharge: 2020-01-08 | DRG: 470 | Disposition: A | Discharge status: Home Health | Payer: Medicare HMO | Attending: Orthopedic Surgery | Admitting: Orthopedic Surgery

## 2020-01-06 ENCOUNTER — Inpatient Hospital Stay: Payer: Medicare HMO | Admitting: Anesthesiology

## 2020-01-06 ENCOUNTER — Encounter
Admission: RE | Disposition: A | Discharge status: Home / Self Care | Payer: Self-pay | Source: Home / Self Care | Attending: Orthopedic Surgery

## 2020-01-06 ENCOUNTER — Inpatient Hospital Stay: Payer: Medicare HMO

## 2020-01-06 ENCOUNTER — Encounter: Payer: Self-pay | Admitting: Orthopedic Surgery

## 2020-01-06 ENCOUNTER — Other Ambulatory Visit: Payer: Self-pay

## 2020-01-06 DIAGNOSIS — Z8249 Family history of ischemic heart disease and other diseases of the circulatory system: Secondary | ICD-10-CM

## 2020-01-06 DIAGNOSIS — Z87442 Personal history of urinary calculi: Secondary | ICD-10-CM | POA: Diagnosis not present

## 2020-01-06 DIAGNOSIS — E782 Mixed hyperlipidemia: Secondary | ICD-10-CM | POA: Diagnosis not present

## 2020-01-06 DIAGNOSIS — E1122 Type 2 diabetes mellitus with diabetic chronic kidney disease: Secondary | ICD-10-CM | POA: Diagnosis not present

## 2020-01-06 DIAGNOSIS — Z833 Family history of diabetes mellitus: Secondary | ICD-10-CM | POA: Diagnosis not present

## 2020-01-06 DIAGNOSIS — E785 Hyperlipidemia, unspecified: Secondary | ICD-10-CM | POA: Diagnosis not present

## 2020-01-06 DIAGNOSIS — Z800 Family history of malignant neoplasm of digestive organs: Secondary | ICD-10-CM | POA: Diagnosis not present

## 2020-01-06 DIAGNOSIS — Z7984 Long term (current) use of oral hypoglycemic drugs: Secondary | ICD-10-CM | POA: Diagnosis not present

## 2020-01-06 DIAGNOSIS — I4891 Unspecified atrial fibrillation: Secondary | ICD-10-CM | POA: Diagnosis present

## 2020-01-06 DIAGNOSIS — Z7982 Long term (current) use of aspirin: Secondary | ICD-10-CM

## 2020-01-06 DIAGNOSIS — I2510 Atherosclerotic heart disease of native coronary artery without angina pectoris: Secondary | ICD-10-CM | POA: Diagnosis present

## 2020-01-06 DIAGNOSIS — N182 Chronic kidney disease, stage 2 (mild): Secondary | ICD-10-CM | POA: Diagnosis present

## 2020-01-06 DIAGNOSIS — Z8262 Family history of osteoporosis: Secondary | ICD-10-CM | POA: Diagnosis not present

## 2020-01-06 DIAGNOSIS — Z96651 Presence of right artificial knee joint: Secondary | ICD-10-CM | POA: Diagnosis present

## 2020-01-06 DIAGNOSIS — Z7902 Long term (current) use of antithrombotics/antiplatelets: Secondary | ICD-10-CM | POA: Diagnosis not present

## 2020-01-06 DIAGNOSIS — M1712 Unilateral primary osteoarthritis, left knee: Principal | ICD-10-CM | POA: Diagnosis present

## 2020-01-06 DIAGNOSIS — Z20822 Contact with and (suspected) exposure to covid-19: Secondary | ICD-10-CM | POA: Diagnosis not present

## 2020-01-06 DIAGNOSIS — K219 Gastro-esophageal reflux disease without esophagitis: Secondary | ICD-10-CM | POA: Diagnosis present

## 2020-01-06 DIAGNOSIS — Z79899 Other long term (current) drug therapy: Secondary | ICD-10-CM | POA: Diagnosis not present

## 2020-01-06 DIAGNOSIS — Z96659 Presence of unspecified artificial knee joint: Secondary | ICD-10-CM

## 2020-01-06 DIAGNOSIS — Z96652 Presence of left artificial knee joint: Secondary | ICD-10-CM | POA: Diagnosis not present

## 2020-01-06 DIAGNOSIS — Z955 Presence of coronary angioplasty implant and graft: Secondary | ICD-10-CM | POA: Diagnosis not present

## 2020-01-06 DIAGNOSIS — E1151 Type 2 diabetes mellitus with diabetic peripheral angiopathy without gangrene: Secondary | ICD-10-CM | POA: Diagnosis present

## 2020-01-06 DIAGNOSIS — I129 Hypertensive chronic kidney disease with stage 1 through stage 4 chronic kidney disease, or unspecified chronic kidney disease: Secondary | ICD-10-CM | POA: Diagnosis not present

## 2020-01-06 DIAGNOSIS — Z471 Aftercare following joint replacement surgery: Secondary | ICD-10-CM | POA: Diagnosis not present

## 2020-01-06 HISTORY — PX: KNEE ARTHROPLASTY: SHX992

## 2020-01-06 LAB — GLUCOSE, CAPILLARY
Glucose-Capillary: 109 mg/dL — ABNORMAL HIGH (ref 70–99)
Glucose-Capillary: 162 mg/dL — ABNORMAL HIGH (ref 70–99)
Glucose-Capillary: 262 mg/dL — ABNORMAL HIGH (ref 70–99)

## 2020-01-06 SURGERY — ARTHROPLASTY, KNEE, TOTAL, USING IMAGELESS COMPUTER-ASSISTED NAVIGATION
Anesthesia: Spinal | Site: Knee | Laterality: Left

## 2020-01-06 MED ORDER — GLYCOPYRROLATE 0.2 MG/ML IJ SOLN
INTRAMUSCULAR | Status: DC | PRN
  Administered 2020-01-06: .2 mg via INTRAVENOUS

## 2020-01-06 MED ORDER — TRANEXAMIC ACID-NACL 1000-0.7 MG/100ML-% IV SOLN: 1000.0000 mg | Freq: Once | INTRAVENOUS | Status: AC

## 2020-01-06 MED ORDER — FENTANYL CITRATE (PF) 100 MCG/2ML IJ SOLN
INTRAMUSCULAR | Status: AC
  Filled 2020-01-06: qty 2

## 2020-01-06 MED ORDER — EPHEDRINE SULFATE 50 MG/ML IJ SOLN
INTRAMUSCULAR | Status: DC | PRN
  Administered 2020-01-06: 10 mg via INTRAVENOUS

## 2020-01-06 MED ORDER — ONDANSETRON HCL 4 MG/2ML IJ SOLN: 4.0000 mg | Freq: Four times a day (QID) | INTRAMUSCULAR | Status: DC | PRN

## 2020-01-06 MED ORDER — LISINOPRIL 10 MG PO TABS
10.0000 mg | ORAL_TABLET | Freq: Every day | ORAL | Status: DC
  Administered 2020-01-06: 10 mg via ORAL
  Filled 2020-01-06 (×2): qty 1

## 2020-01-06 MED ORDER — INSULIN ASPART 100 UNIT/ML ~~LOC~~ SOLN
0.0000 [IU] | Freq: Every day | SUBCUTANEOUS | Status: DC
  Administered 2020-01-07: 2 [IU] via SUBCUTANEOUS
  Filled 2020-01-06: qty 1

## 2020-01-06 MED ORDER — ENOXAPARIN SODIUM 30 MG/0.3ML ~~LOC~~ SOLN
30.0000 mg | Freq: Two times a day (BID) | SUBCUTANEOUS | Status: DC
  Administered 2020-01-07 – 2020-01-08 (×3): 30 mg via SUBCUTANEOUS
  Filled 2020-01-06 (×3): qty 0.3

## 2020-01-06 MED ORDER — OXYCODONE HCL 5 MG PO TABS: 5.0000 mg | ORAL_TABLET | ORAL | Status: DC | PRN

## 2020-01-06 MED ORDER — TRANEXAMIC ACID-NACL 1000-0.7 MG/100ML-% IV SOLN
INTRAVENOUS | Status: AC
  Administered 2020-01-06: 1000 mg via INTRAVENOUS
  Filled 2020-01-06: qty 100

## 2020-01-06 MED ORDER — DEXAMETHASONE SODIUM PHOSPHATE 10 MG/ML IJ SOLN: 8.0000 mg | Freq: Once | INTRAMUSCULAR | Status: AC

## 2020-01-06 MED ORDER — CELECOXIB 200 MG PO CAPS
ORAL_CAPSULE | ORAL | Status: AC
  Administered 2020-01-06: 400 mg via ORAL
  Filled 2020-01-06: qty 2

## 2020-01-06 MED ORDER — BISACODYL 10 MG RE SUPP: 10.0000 mg | Freq: Every day | RECTAL | Status: DC | PRN

## 2020-01-06 MED ORDER — ONDANSETRON HCL 4 MG/2ML IJ SOLN
INTRAMUSCULAR | Status: DC | PRN
  Administered 2020-01-06: 4 mg via INTRAVENOUS

## 2020-01-06 MED ORDER — RISAQUAD PO CAPS
1.0000 | ORAL_CAPSULE | Freq: Every day | ORAL | Status: DC
  Administered 2020-01-07 – 2020-01-08 (×2): 1 via ORAL
  Filled 2020-01-06 (×3): qty 1

## 2020-01-06 MED ORDER — TRAMADOL HCL 50 MG PO TABS: 50.0000 mg | ORAL_TABLET | ORAL | Status: DC | PRN

## 2020-01-06 MED ORDER — PANTOPRAZOLE SODIUM 40 MG PO TBEC
40.0000 mg | DELAYED_RELEASE_TABLET | Freq: Two times a day (BID) | ORAL | Status: DC
  Administered 2020-01-06 – 2020-01-08 (×4): 40 mg via ORAL
  Filled 2020-01-06 (×4): qty 1

## 2020-01-06 MED ORDER — PROPOFOL 500 MG/50ML IV EMUL
INTRAVENOUS | Status: AC
  Filled 2020-01-06: qty 50

## 2020-01-06 MED ORDER — DEXAMETHASONE SODIUM PHOSPHATE 10 MG/ML IJ SOLN
INTRAMUSCULAR | Status: AC
  Administered 2020-01-06: 8 mg via INTRAVENOUS
  Filled 2020-01-06: qty 1

## 2020-01-06 MED ORDER — METOCLOPRAMIDE HCL 10 MG PO TABS: 5.0000 mg | ORAL_TABLET | Freq: Three times a day (TID) | ORAL | Status: DC | PRN

## 2020-01-06 MED ORDER — NITROGLYCERIN 0.4 MG SL SUBL: 0.4000 mg | SUBLINGUAL_TABLET | SUBLINGUAL | Status: DC | PRN

## 2020-01-06 MED ORDER — TRANEXAMIC ACID-NACL 1000-0.7 MG/100ML-% IV SOLN
1000.0000 mg | INTRAVENOUS | Status: AC
  Administered 2020-01-06: 1000 mg via INTRAVENOUS

## 2020-01-06 MED ORDER — ONDANSETRON HCL 4 MG/2ML IJ SOLN: 4.0000 mg | Freq: Once | INTRAMUSCULAR | Status: DC | PRN

## 2020-01-06 MED ORDER — GABAPENTIN 300 MG PO CAPS
300.0000 mg | ORAL_CAPSULE | Freq: Every day | ORAL | Status: DC
  Administered 2020-01-06 – 2020-01-07 (×2): 300 mg via ORAL
  Filled 2020-01-06 (×2): qty 1

## 2020-01-06 MED ORDER — BUPIVACAINE HCL (PF) 0.25 % IJ SOLN
INTRAMUSCULAR | Status: DC | PRN
  Administered 2020-01-06: 60 mL

## 2020-01-06 MED ORDER — METOCLOPRAMIDE HCL 5 MG/ML IJ SOLN: 5.0000 mg | Freq: Three times a day (TID) | INTRAMUSCULAR | Status: DC | PRN

## 2020-01-06 MED ORDER — FLEET ENEMA 7-19 GM/118ML RE ENEM: 1.0000 | ENEMA | Freq: Once | RECTAL | Status: DC | PRN

## 2020-01-06 MED ORDER — ACETAMINOPHEN 325 MG PO TABS: 325.0000 mg | ORAL_TABLET | Freq: Four times a day (QID) | ORAL | Status: DC | PRN

## 2020-01-06 MED ORDER — PROPOFOL 10 MG/ML IV BOLUS
INTRAVENOUS | Status: DC | PRN
  Administered 2020-01-06: 160 mg via INTRAVENOUS

## 2020-01-06 MED ORDER — MENTHOL 3 MG MT LOZG
1.0000 | LOZENGE | OROMUCOSAL | Status: DC | PRN
  Filled 2020-01-06: qty 9

## 2020-01-06 MED ORDER — PHENOL 1.4 % MT LIQD
1.0000 | OROMUCOSAL | Status: DC | PRN
  Filled 2020-01-06: qty 177

## 2020-01-06 MED ORDER — HYDROMORPHONE HCL 1 MG/ML IJ SOLN: 0.5000 mg | INTRAMUSCULAR | Status: DC | PRN

## 2020-01-06 MED ORDER — TETRACAINE HCL 1 % IJ SOLN
INTRAMUSCULAR | Status: AC
  Filled 2020-01-06: qty 2

## 2020-01-06 MED ORDER — BUPIVACAINE LIPOSOME 1.3 % IJ SUSP
INTRAMUSCULAR | Status: AC
  Filled 2020-01-06: qty 20

## 2020-01-06 MED ORDER — ADULT MULTIVITAMIN W/MINERALS CH
1.0000 | ORAL_TABLET | Freq: Every day | ORAL | Status: DC
  Administered 2020-01-06 – 2020-01-08 (×3): 1 via ORAL
  Filled 2020-01-06 (×3): qty 1

## 2020-01-06 MED ORDER — CELECOXIB 200 MG PO CAPS: 400.0000 mg | ORAL_CAPSULE | Freq: Once | ORAL | Status: AC

## 2020-01-06 MED ORDER — ROCURONIUM BROMIDE 100 MG/10ML IV SOLN
INTRAVENOUS | Status: DC | PRN
  Administered 2020-01-06: 50 mg via INTRAVENOUS
  Administered 2020-01-06 (×2): 20 mg via INTRAVENOUS

## 2020-01-06 MED ORDER — LIDOCAINE HCL (CARDIAC) PF 100 MG/5ML IV SOSY
PREFILLED_SYRINGE | INTRAVENOUS | Status: DC | PRN
  Administered 2020-01-06: 60 mg via INTRAVENOUS

## 2020-01-06 MED ORDER — METOCLOPRAMIDE HCL 10 MG PO TABS
10.0000 mg | ORAL_TABLET | Freq: Three times a day (TID) | ORAL | Status: DC
  Administered 2020-01-06 – 2020-01-08 (×6): 10 mg via ORAL
  Filled 2020-01-06 (×6): qty 1

## 2020-01-06 MED ORDER — ROSUVASTATIN CALCIUM 10 MG PO TABS
40.0000 mg | ORAL_TABLET | Freq: Every day | ORAL | Status: DC
  Administered 2020-01-06 – 2020-01-07 (×2): 40 mg via ORAL
  Filled 2020-01-06 (×2): qty 4

## 2020-01-06 MED ORDER — LIDOCAINE HCL (PF) 2 % IJ SOLN
INTRAMUSCULAR | Status: AC
  Filled 2020-01-06: qty 5

## 2020-01-06 MED ORDER — CLOPIDOGREL BISULFATE 75 MG PO TABS
75.0000 mg | ORAL_TABLET | Freq: Every day | ORAL | Status: DC
  Administered 2020-01-07 – 2020-01-08 (×2): 75 mg via ORAL
  Filled 2020-01-06 (×2): qty 1

## 2020-01-06 MED ORDER — ONDANSETRON HCL 4 MG PO TABS: 4.0000 mg | ORAL_TABLET | Freq: Four times a day (QID) | ORAL | Status: DC | PRN

## 2020-01-06 MED ORDER — DEXAMETHASONE SODIUM PHOSPHATE 10 MG/ML IJ SOLN
INTRAMUSCULAR | Status: DC | PRN
  Administered 2020-01-06: 8 mg via INTRAVENOUS

## 2020-01-06 MED ORDER — FENTANYL CITRATE (PF) 100 MCG/2ML IJ SOLN
INTRAMUSCULAR | Status: DC | PRN
  Administered 2020-01-06: 12.5 ug via INTRAVENOUS
  Administered 2020-01-06 (×2): 50 ug via INTRAVENOUS
  Administered 2020-01-06: 12.5 ug via INTRAVENOUS
  Administered 2020-01-06: 25 ug via INTRAVENOUS
  Administered 2020-01-06: 50 ug via INTRAVENOUS

## 2020-01-06 MED ORDER — INSULIN ASPART 100 UNIT/ML ~~LOC~~ SOLN: 0.0000 [IU] | Freq: Three times a day (TID) | SUBCUTANEOUS | Status: DC

## 2020-01-06 MED ORDER — SODIUM CHLORIDE FLUSH 0.9 % IV SOLN
INTRAVENOUS | Status: AC
  Filled 2020-01-06: qty 40

## 2020-01-06 MED ORDER — SODIUM CHLORIDE 0.9 % IV SOLN: INTRAVENOUS | Status: DC

## 2020-01-06 MED ORDER — GABAPENTIN 300 MG PO CAPS: 300.0000 mg | ORAL_CAPSULE | Freq: Once | ORAL | Status: AC

## 2020-01-06 MED ORDER — CELECOXIB 200 MG PO CAPS
200.0000 mg | ORAL_CAPSULE | Freq: Two times a day (BID) | ORAL | Status: DC
  Administered 2020-01-06 – 2020-01-08 (×4): 200 mg via ORAL
  Filled 2020-01-06 (×4): qty 1

## 2020-01-06 MED ORDER — CEFAZOLIN SODIUM-DEXTROSE 2-4 GM/100ML-% IV SOLN
INTRAVENOUS | Status: AC
  Filled 2020-01-06: qty 100

## 2020-01-06 MED ORDER — ACETAMINOPHEN 10 MG/ML IV SOLN
INTRAVENOUS | Status: DC | PRN
  Administered 2020-01-06: 1000 mg via INTRAVENOUS

## 2020-01-06 MED ORDER — INSULIN ASPART 100 UNIT/ML ~~LOC~~ SOLN
0.0000 [IU] | Freq: Three times a day (TID) | SUBCUTANEOUS | Status: DC
  Administered 2020-01-07: 5 [IU] via SUBCUTANEOUS
  Administered 2020-01-07: 09:00:00 3 [IU] via SUBCUTANEOUS
  Filled 2020-01-06 (×2): qty 1

## 2020-01-06 MED ORDER — CARVEDILOL 3.125 MG PO TABS
3.1250 mg | ORAL_TABLET | Freq: Two times a day (BID) | ORAL | Status: DC
  Administered 2020-01-07: 09:00:00 3.125 mg via ORAL
  Filled 2020-01-06 (×3): qty 1

## 2020-01-06 MED ORDER — GABAPENTIN 300 MG PO CAPS
ORAL_CAPSULE | ORAL | Status: AC
  Administered 2020-01-06: 300 mg via ORAL
  Filled 2020-01-06: qty 1

## 2020-01-06 MED ORDER — SODIUM CHLORIDE 0.9 % IV SOLN
INTRAVENOUS | Status: DC | PRN
  Administered 2020-01-06: 15 ug/min via INTRAVENOUS

## 2020-01-06 MED ORDER — SODIUM CHLORIDE 0.9 % IV SOLN
INTRAVENOUS | Status: DC | PRN
  Administered 2020-01-06: 60 mL

## 2020-01-06 MED ORDER — CEFAZOLIN SODIUM-DEXTROSE 2-4 GM/100ML-% IV SOLN
2.0000 g | Freq: Four times a day (QID) | INTRAVENOUS | Status: AC
  Administered 2020-01-06 – 2020-01-07 (×4): 2 g via INTRAVENOUS
  Filled 2020-01-06 (×5): qty 100

## 2020-01-06 MED ORDER — SUGAMMADEX SODIUM 200 MG/2ML IV SOLN
INTRAVENOUS | Status: DC | PRN
  Administered 2020-01-06: 200 mg via INTRAVENOUS

## 2020-01-06 MED ORDER — NEOMYCIN-POLYMYXIN B GU 40-200000 IR SOLN
Status: AC
  Filled 2020-01-06: qty 20

## 2020-01-06 MED ORDER — CEFAZOLIN SODIUM-DEXTROSE 2-4 GM/100ML-% IV SOLN
2.0000 g | INTRAVENOUS | Status: AC
  Administered 2020-01-06: 2 g via INTRAVENOUS

## 2020-01-06 MED ORDER — SENNOSIDES-DOCUSATE SODIUM 8.6-50 MG PO TABS
1.0000 | ORAL_TABLET | Freq: Two times a day (BID) | ORAL | Status: DC
  Administered 2020-01-06 – 2020-01-07 (×2): 1 via ORAL
  Filled 2020-01-06 (×4): qty 1

## 2020-01-06 MED ORDER — BUPIVACAINE HCL (PF) 0.25 % IJ SOLN
INTRAMUSCULAR | Status: AC
  Filled 2020-01-06: qty 60

## 2020-01-06 MED ORDER — GLIPIZIDE ER 2.5 MG PO TB24
2.5000 mg | ORAL_TABLET | Freq: Every day | ORAL | Status: DC
  Administered 2020-01-06 – 2020-01-08 (×3): 2.5 mg via ORAL
  Filled 2020-01-06 (×3): qty 1

## 2020-01-06 MED ORDER — FERROUS SULFATE 325 (65 FE) MG PO TABS
325.0000 mg | ORAL_TABLET | Freq: Two times a day (BID) | ORAL | Status: DC
  Administered 2020-01-07 – 2020-01-08 (×2): 325 mg via ORAL
  Filled 2020-01-06 (×3): qty 1

## 2020-01-06 MED ORDER — ENSURE PRE-SURGERY PO LIQD
296.0000 mL | Freq: Once | ORAL | Status: AC
  Administered 2020-01-06: 296 mL via ORAL
  Filled 2020-01-06: qty 296

## 2020-01-06 MED ORDER — DIPHENHYDRAMINE HCL 12.5 MG/5ML PO ELIX: 12.5000 mg | ORAL_SOLUTION | ORAL | Status: DC | PRN

## 2020-01-06 MED ORDER — OXYCODONE HCL 5 MG PO TABS
10.0000 mg | ORAL_TABLET | ORAL | Status: DC | PRN
  Administered 2020-01-06: 10 mg via ORAL
  Filled 2020-01-06: qty 2

## 2020-01-06 MED ORDER — MAGNESIUM HYDROXIDE 400 MG/5ML PO SUSP
30.0000 mL | Freq: Every day | ORAL | Status: DC
  Administered 2020-01-07: 09:00:00 30 mL via ORAL
  Filled 2020-01-06: qty 30

## 2020-01-06 MED ORDER — BUPIVACAINE HCL (PF) 0.5 % IJ SOLN
INTRAMUSCULAR | Status: AC
  Filled 2020-01-06: qty 10

## 2020-01-06 MED ORDER — NEOMYCIN-POLYMYXIN B GU 40-200000 IR SOLN
Status: DC | PRN
  Administered 2020-01-06: 14 mL

## 2020-01-06 MED ORDER — ALUM & MAG HYDROXIDE-SIMETH 200-200-20 MG/5ML PO SUSP: 30.0000 mL | ORAL | Status: DC | PRN

## 2020-01-06 MED ORDER — FENTANYL CITRATE (PF) 100 MCG/2ML IJ SOLN: 25.0000 ug | INTRAMUSCULAR | Status: DC | PRN

## 2020-01-06 MED ORDER — ACETAMINOPHEN 10 MG/ML IV SOLN
INTRAVENOUS | Status: AC
  Filled 2020-01-06: qty 100

## 2020-01-06 MED ORDER — ACETAMINOPHEN 10 MG/ML IV SOLN
1000.0000 mg | Freq: Four times a day (QID) | INTRAVENOUS | Status: AC
  Administered 2020-01-06 – 2020-01-07 (×4): 1000 mg via INTRAVENOUS
  Filled 2020-01-06 (×4): qty 100

## 2020-01-06 MED ORDER — METFORMIN HCL ER 500 MG PO TB24
500.0000 mg | ORAL_TABLET | Freq: Every day | ORAL | Status: DC
  Administered 2020-01-07 – 2020-01-08 (×2): 500 mg via ORAL
  Filled 2020-01-06 (×2): qty 1

## 2020-01-06 MED ORDER — PROPOFOL 10 MG/ML IV BOLUS
INTRAVENOUS | Status: AC
  Filled 2020-01-06: qty 20

## 2020-01-06 MED ORDER — TRANEXAMIC ACID-NACL 1000-0.7 MG/100ML-% IV SOLN
INTRAVENOUS | Status: AC
  Filled 2020-01-06: qty 100

## 2020-01-06 MED ORDER — CHLORHEXIDINE GLUCONATE 4 % EX LIQD
60.0000 mL | Freq: Once | CUTANEOUS | Status: AC
  Administered 2020-01-06: 4 via TOPICAL

## 2020-01-06 SURGICAL SUPPLY — 76 items
BATTERY INSTRU NAVIGATION (MISCELLANEOUS) ×8 IMPLANT
BLADE SAW 70X12.5 (BLADE) ×2 IMPLANT
BLADE SAW 90X13X1.19 OSCILLAT (BLADE) ×2 IMPLANT
BLADE SAW 90X25X1.19 OSCILLAT (BLADE) ×2 IMPLANT
BONE CEMENT GENTAMICIN (Cement) ×2 IMPLANT
BTRY SRG DRVR LF (MISCELLANEOUS) ×4
CANISTER SUCT 3000ML PPV (MISCELLANEOUS) ×2 IMPLANT
CEMENT BONE GENTAMICIN 40 (Cement) IMPLANT
COOLER POLAR GLACIER W/PUMP (MISCELLANEOUS) ×2 IMPLANT
COVER WAND RF STERILE (DRAPES) ×2 IMPLANT
CUFF TOURN SGL QUICK 24 (TOURNIQUET CUFF) ×2
CUFF TOURN SGL QUICK 30 (TOURNIQUET CUFF)
CUFF TRNQT CYL 24X4X16.5-23 (TOURNIQUET CUFF) IMPLANT
CUFF TRNQT CYL 30X4X21-28X (TOURNIQUET CUFF) IMPLANT
DRAPE 3/4 80X56 (DRAPES) ×2 IMPLANT
DRSG DERMACEA 8X12 NADH (GAUZE/BANDAGES/DRESSINGS) ×2 IMPLANT
DRSG OPSITE POSTOP 4X10 (GAUZE/BANDAGES/DRESSINGS) ×1 IMPLANT
DRSG OPSITE POSTOP 4X14 (GAUZE/BANDAGES/DRESSINGS) ×1 IMPLANT
DRSG TEGADERM 4X4.75 (GAUZE/BANDAGES/DRESSINGS) ×2 IMPLANT
DURAPREP 26ML APPLICATOR (WOUND CARE) ×3 IMPLANT
ELECT REM PT RETURN 9FT ADLT (ELECTROSURGICAL) ×2
ELECTRODE REM PT RTRN 9FT ADLT (ELECTROSURGICAL) ×1 IMPLANT
EX-PIN ORTHOLOCK NAV 4X150 (PIN) ×4 IMPLANT
FEMUR SIGMA PS SZ 2.5 L (Knees) ×1 IMPLANT
GLOVE BIO SURGEON STRL SZ7.5 (GLOVE) ×4 IMPLANT
GLOVE BIOGEL M STRL SZ7.5 (GLOVE) ×4 IMPLANT
GLOVE BIOGEL PI IND STRL 7.5 (GLOVE) ×1 IMPLANT
GLOVE BIOGEL PI INDICATOR 7.5 (GLOVE) ×1
GLOVE INDICATOR 8.0 STRL GRN (GLOVE) ×2 IMPLANT
GOWN STRL REUS W/ TWL LRG LVL3 (GOWN DISPOSABLE) ×2 IMPLANT
GOWN STRL REUS W/ TWL XL LVL3 (GOWN DISPOSABLE) ×1 IMPLANT
GOWN STRL REUS W/TWL LRG LVL3 (GOWN DISPOSABLE) ×6
GOWN STRL REUS W/TWL XL LVL3 (GOWN DISPOSABLE) ×2
HEMOVAC 400CC 10FR (MISCELLANEOUS) ×2 IMPLANT
HOLDER FOLEY CATH W/STRAP (MISCELLANEOUS) ×2 IMPLANT
HOOD PEEL AWAY FLYTE STAYCOOL (MISCELLANEOUS) ×4 IMPLANT
INSERT PFC SIG STB SZ 2.5 15.5 (Knees) ×1 IMPLANT
KIT TURNOVER KIT A (KITS) ×2 IMPLANT
KNIFE SCULPS 14X20 (INSTRUMENTS) ×2 IMPLANT
LABEL OR SOLS (LABEL) ×1 IMPLANT
MANIFOLD NEPTUNE II (INSTRUMENTS) ×2 IMPLANT
NDL SAFETY ECLIPSE 18X1.5 (NEEDLE) ×1 IMPLANT
NDL SPNL 20GX3.5 QUINCKE YW (NEEDLE) ×2 IMPLANT
NEEDLE HYPO 18GX1.5 SHARP (NEEDLE) ×2
NEEDLE SPNL 20GX3.5 QUINCKE YW (NEEDLE) ×4 IMPLANT
NS IRRIG 500ML POUR BTL (IV SOLUTION) ×2 IMPLANT
PACK TOTAL KNEE (MISCELLANEOUS) ×2 IMPLANT
PAD WRAPON POLAR KNEE (MISCELLANEOUS) ×1 IMPLANT
PATELLA DOME PFC 35MM (Knees) ×1 IMPLANT
PENCIL SMOKE EVACUATOR COATED (MISCELLANEOUS) ×2 IMPLANT
PENCIL SMOKE ULTRAEVAC 22 CON (MISCELLANEOUS) ×2 IMPLANT
PIN DRILL QUICK PACK ×2 IMPLANT
PIN FIXATION 1/8DIA X 3INL (PIN) ×6 IMPLANT
PIN STEINMAN FIXATION KNEE (PIN) ×1 IMPLANT
PULSAVAC PLUS IRRIG FAN TIP (DISPOSABLE) ×2
SOL .9 NS 3000ML IRR  AL (IV SOLUTION) ×2
SOL .9 NS 3000ML IRR AL (IV SOLUTION) ×1
SOL .9 NS 3000ML IRR UROMATIC (IV SOLUTION) ×1 IMPLANT
SOL PREP PVP 2OZ (MISCELLANEOUS) ×2
SOLUTION PREP PVP 2OZ (MISCELLANEOUS) ×1 IMPLANT
SPONGE DRAIN TRACH 4X4 STRL 2S (GAUZE/BANDAGES/DRESSINGS) ×2 IMPLANT
STAPLER SKIN PROX 35W (STAPLE) ×2 IMPLANT
STOCKINETTE IMPERV 14X48 (MISCELLANEOUS) IMPLANT
STRAP TIBIA SHORT (MISCELLANEOUS) ×2 IMPLANT
SUCTION FRAZIER HANDLE 10FR (MISCELLANEOUS) ×2
SUCTION TUBE FRAZIER 10FR DISP (MISCELLANEOUS) ×1 IMPLANT
SUT VIC AB 0 CT1 36 (SUTURE) ×4 IMPLANT
SUT VIC AB 1 CT1 36 (SUTURE) ×4 IMPLANT
SUT VIC AB 2-0 CT2 27 (SUTURE) ×2 IMPLANT
SYR 20ML LL LF (SYRINGE) ×2 IMPLANT
SYR 30ML LL (SYRINGE) ×4 IMPLANT
TIP FAN IRRIG PULSAVAC PLUS (DISPOSABLE) ×1 IMPLANT
TOWEL OR 17X26 4PK STRL BLUE (TOWEL DISPOSABLE) ×1 IMPLANT
TOWER CARTRIDGE SMART MIX (DISPOSABLE) ×2 IMPLANT
TRAY FOLEY MTR SLVR 16FR STAT (SET/KITS/TRAYS/PACK) ×2 IMPLANT
WRAPON POLAR PAD KNEE (MISCELLANEOUS) ×2

## 2020-01-06 NOTE — H&P (Signed)
The patient has been re-examined, and the chart reviewed, and there have been no interval changes to the documented history and physical.    The risks, benefits, and alternatives have been discussed at length. The patient expressed understanding of the risks benefits and agreed with plans for surgical intervention.  Sanyla Summey P. Delvina Mizzell, Jr. M.D.    

## 2020-01-06 NOTE — Progress Notes (Signed)
Carvedilol 3.125mg  was held because of the order parameters and heart rate was less than 60. [facility] was informed via secure chat.

## 2020-01-06 NOTE — Op Note (Signed)
OPERATIVE NOTE  DATE OF SURGERY:  01/06/2020  PATIENT NAME:  Tracey [name]   DOB: 08-04-1936  MRN: [id number]  PRE-OPERATIVE DIAGNOSIS: Degenerative arthrosis of the left knee, primary  POST-OPERATIVE DIAGNOSIS:  Same  PROCEDURE:  Left total knee arthroplasty using computer-assisted navigation  SURGEON:  Marciano Sequin. M.D.  ASSISTANT: Cassell Smiles, PA-C (present and scrubbed throughout the case, critical for assistance with exposure, retraction, instrumentation, and closure)  ANESTHESIA: general  ESTIMATED BLOOD LOSS: 50 mL  FLUIDS REPLACED: 700 mL of crystalloid  TOURNIQUET TIME: 98 minutes  DRAINS: 2 medium Hemovac drains  SOFT TISSUE RELEASES: Anterior cruciate ligament, posterior cruciate ligament, deep medial collateral ligament, patellofemoral ligament, and posterolateral corner  IMPLANTS UTILIZED: DePuy PFC Sigma size 2.5 posterior stabilized femoral component (cemented), size 2.5 MBT tibial component (cemented), 35 mm 3 peg oval dome patella (cemented), and a 15 mm stabilized rotating platform polyethylene insert.  INDICATIONS FOR SURGERY: Tracey [name] is a 84 y.o. year old female with a long history of progressive knee pain. X-rays demonstrated severe degenerative changes in tricompartmental fashion. The patient had not seen any significant improvement despite conservative nonsurgical intervention. After discussion of the risks and benefits of surgical intervention, the patient expressed understanding of the risks benefits and agree with plans for total knee arthroplasty.   The risks, benefits, and alternatives were discussed at length including but not limited to the risks of infection, bleeding, nerve injury, stiffness, blood clots, the need for revision surgery, cardiopulmonary complications, among others, and they were willing to proceed.  PROCEDURE IN DETAIL: The patient was brought into the operating room and, after adequate general anesthesia was achieved, a  tourniquet was placed on the patient's upper thigh. The patient's knee and leg were cleaned and prepped with alcohol and DuraPrep and draped in the usual sterile fashion. A "timeout" was performed as per usual protocol. The lower extremity was exsanguinated using an Esmarch, and the tourniquet was inflated to 300 mmHg. An anterior longitudinal incision was made followed by a standard mid vastus approach. The deep fibers of the medial collateral ligament were elevated in a subperiosteal fashion off of the medial flare of the tibia so as to maintain a continuous soft tissue sleeve. The patella was subluxed laterally and the patellofemoral ligament was incised. Inspection of the knee demonstrated severe degenerative changes with full-thickness loss of articular cartilage. Osteophytes were debrided using a rongeur. Anterior and posterior cruciate ligaments were excised. Two 4.0 mm Schanz pins were inserted in the femur and into the tibia for attachment of the array of trackers used for computer-assisted navigation. Hip center was identified using a circumduction technique. Distal landmarks were mapped using the computer. The distal femur and proximal tibia were mapped using the computer. The distal femoral cutting guide was positioned using computer-assisted navigation so as to achieve a 5 distal valgus cut. The femur was sized and it was felt that a size 2.5 femoral component was appropriate. A size 2.5 femoral cutting guide was positioned and the anterior cut was performed and verified using the computer. This was followed by completion of the posterior and chamfer cuts. Femoral cutting guide for the central box was then positioned in the center box cut was performed.  Attention was then directed to the proximal tibia. Medial and lateral menisci were excised. The extramedullary tibial cutting guide was positioned using computer-assisted navigation so as to achieve a 0 varus-valgus alignment and 0 posterior slope.  The cut was performed and verified using the  computer. The proximal tibia was sized and it was felt that a size 2.5 tibial tray was appropriate. Tibial and femoral trials were inserted followed by insertion of a 10 mm polyethylene insert. The knee was felt to be tight laterally.  The trial components were removed and the knee was brought into full extension and distracted using the Moreland retractors.  The posterolateral corner was carefully released using a combination of electrocautery and Metzenbaum scissors.  Trial components were reinserted followed by placement of a 15 mm polyethylene trial.  This allowed for excellent mediolateral soft tissue balancing both in flexion and in full extension. Finally, the patella was cut and prepared so as to accommodate a 35 mm 3 peg oval dome patella. A patella trial was placed and the knee was placed through a range of motion with excellent patellar tracking appreciated. The femoral trial was removed after debridement of posterior osteophytes. The central post-hole for the tibial component was reamed followed by insertion of a keel punch. Tibial trials were then removed. Cut surfaces of bone were irrigated with copious amounts of normal saline with antibiotic solution using pulsatile lavage and then suctioned dry. Polymethylmethacrylate cement with gentamicin was prepared in the usual fashion using a vacuum mixer. Cement was applied to the cut surface of the proximal tibia as well as along the undersurface of a size 2.5 MBT tibial component. Tibial component was positioned and impacted into place. Excess cement was removed using [employer] fast streamer. Cement was then applied to the cut surfaces of the femur as well as along the posterior flanges of the size 2.5 femoral component. The femoral component was positioned and impacted into place. Excess cement was removed using [employer] fast streamer. A 15 mm polyethylene trial was inserted and the knee was brought into full extension with  steady axial compression applied. Finally, cement was applied to the backside of a 35 mm 3 peg oval dome patella and the patellar component was positioned and patellar clamp applied. Excess cement was removed using [employer] fast streamer. After adequate curing of the cement, the tourniquet was deflated after a total tourniquet time of 98 minutes. Hemostasis was achieved using electrocautery. The knee was irrigated with copious amounts of normal saline with antibiotic solution using pulsatile lavage and then suctioned dry. 20 mL of 1.3% Exparel and 60 mL of 0.25% Marcaine in 40 mL of normal saline was injected along the posterior capsule, medial and lateral gutters, and along the arthrotomy site. A 15 mm stabilized rotating platform polyethylene insert was inserted and the knee was placed through a range of motion with excellent mediolateral soft tissue balancing appreciated and excellent patellar tracking noted. 2 medium drains were placed in the wound bed and brought out through separate stab incisions. The medial parapatellar portion of the incision was reapproximated using interrupted sutures of #1 Vicryl. Subcutaneous tissue was approximated in layers using first #0 Vicryl followed #2-0 Vicryl. The skin was approximated with skin staples. A sterile dressing was applied.  The patient tolerated the procedure well and was transported to the recovery room in stable condition.    Ceanna Wareing P. Holley Bouche., M.D.

## 2020-01-06 NOTE — Anesthesia Procedure Notes (Signed)
Procedure Name: Intubation Date/Time: 01/06/2020 1:20 PM Performed by: Allean Found, CRNA Pre-anesthesia Checklist: Patient identified, Patient being monitored, Timeout performed, Emergency Drugs available and Suction available Patient Re-evaluated:Patient Re-evaluated prior to induction Oxygen Delivery Method: Circle system utilized Preoxygenation: Pre-oxygenation with 100% oxygen Induction Type: IV induction Ventilation: Mask ventilation without difficulty Laryngoscope Size: 3 and McGraph Grade View: Grade I Tube type: Oral Tube size: 7.0 mm Number of attempts: 1 Airway Equipment and Method: Stylet Placement Confirmation: ETT inserted through vocal cords under direct vision,  positive ETCO2 and breath sounds checked- equal and bilateral Secured at: 21 cm Tube secured with: Tape Dental Injury: Teeth and Oropharynx as per pre-operative assessment

## 2020-01-06 NOTE — H&P (Signed)
ORTHOPAEDIC HISTORY & PHYSICAL  Progress Notes [name], [place] - 12/29/2019 11:45 AM EDT [facility] Chief Complaint:   Chief Complaint  Patient presents with  . Left Knee - Pre-operative Evaluation, Pain   History of Present Illness:   Tracey [name] is a 84 y.o. female that presents to clinic today for her preoperative history and evaluation. Patient presents with her husband. The patient is scheduled to undergo a left total knee arthroplasty on 01/06/20 by Dr. Marry Guan. Her pain began several years ago. The pain is located along the medial and lateral joint lines. She reports associated swelling and giving way of the knee. She denies associated numbness or tingling, locking of the knee.   The patient's symptoms have progressed to the point that they decrease her quality of life. The patient has previously undergone conservative treatment including NSAIDS, oral corticosteroids, and injections to the knee without adequate control of her symptoms.  Of note, patient denies history of back surgery. Patient is unsure if she had a blood clot following her previous surgery. Patient sees Dr. Rockey Situ for cardiology.  Past Medical, Surgical, Family, Social History, Allergies, Medications:   Past Medical History:  Past Medical History:  Diagnosis Date  . Alternating constipation and diarrhea 03/07/2015  . Anemia  . Angina pectoris (CMS-HCC)  . CAD (coronary artery disease)  . Diabetes mellitus type 2, uncomplicated (CMS-HCC)  . FH: colon cancer 03/07/2015  . GERD (gastroesophageal reflux disease) 03/07/2015  . Hyperlipidemia  . Hypertension  . Kidney stones  . Osteoporosis  . PVD (peripheral vascular disease) (CMS-HCC)   Past Surgical History:  Past Surgical History:  Procedure Laterality Date  . APPENDECTOMY  . cardiac STENT 2010/2013  . COLONOSCOPY 10/02/2006  Adenomatous Polyp, [facility] (2 Brothers)  . COLONOSCOPY 04/26/2010   Adenomatous Polyps, [facility] (2 Brothers): CBF 04/2015; OV made 03/07/2015 @ 9:30am w/Kim Jerelene Redden NP (dw)  . COLONOSCOPY 01/16/2016  PH Adenomatous Polyps, [facility] (2 Brothers): No repeat due to age per RTE (dw)  . EGD 01/16/2016  Gastritis: No repeat per RTE  . HARVEST RADIAL ARTERY FOR CABG  . LEFT RENAL ARTERY STENT 1997, 2006  . RECONSTRUCTION DISLOCATING PATELLA 2002  fracture of both wrists at the same time  . rectal hemorrhage  . Right total knee arthroplasty 03/25/2013  Dr. Marry Guan  . TONSILLECTOMY   Current Medications:  Current Outpatient Medications  Medication Sig Dispense Refill  . acetaminophen (TYLENOL) 500 MG tablet Take 1,000 mg by mouth every 8 (eight) hours as needed for Pain  . aspirin 81 MG chewable tablet Take 81 mg by mouth once daily.  . biotin 10,000 mcg Cap Take 1 capsule by mouth once daily  . blood glucose diagnostic (GLUCOSE BLOOD) test strip USE AS DIRECTED TO CHECK BLOOD SUGAR ONCE DAILY E08.00  . calcium-magnesium-zinc 333-133-5 mg Tab Take 1 tablet by mouth 2 (two) times daily  . carvediloL (COREG) 3.125 MG tablet Take 3.125 mg by mouth 2 (two) times daily  . clopidogrel (PLAVIX) 75 mg tablet Take 75 mg by mouth once daily.  . cranberry fruit extract (CRANBERRY JUICE POWDER ORAL) Take by mouth  . cyanocobalamin (VITAMIN B12) 1,000 mcg/mL injection Inject into the muscle.  Marland Kitchen glipiZIDE (GLUCOTROL) 2.5 MG XL tablet TAKE 1 TABLET (2.5 MG TOTAL) BY MOUTH DAILY WITH BREAKFAST. 3  . Lactobacillus acidophilus (PROBIOTIC) 10 billion cell Cap Take 1 capsule by mouth once daily  . lancets (ONETOUCH DELICA LANCETS) 33 gauge Misc USE  AS INSTRUCTED TO TEST BLOOD SUGAR ONCE DAILY E08.00  . lisinopriL (ZESTRIL) 10 MG tablet Take 10 mg by mouth once daily  . metFORMIN (GLUCOPHAGE-XR) 500 MG XR tablet TAKE 1 TABLET BY MOUTH EVERY DAY WITH BREAKFAST  . multivitamin with minerals tablet Take by mouth  . nitroGLYcerin (NITROSTAT) 0.4 MG SL tablet Place under the tongue.  . oral  electrolyte (PEDIALYTE) solution Take by mouth as needed  . pantoprazole (PROTONIX) 40 MG DR tablet Take 40 mg by mouth once daily  . rosuvastatin (CRESTOR) 40 MG tablet Take 40 mg by mouth once daily   No current facility-administered medications for this visit.   Allergies:  Allergies  Allergen Reactions  . Ciprofloxacin Muscle Pain  . Codeine Nausea and Vomiting  . Erythromycin Diarrhea  . Tramadol Nausea  Loss of appetite   Social History:  Social History   Socioeconomic History  . Marital status: Widowed  Spouse name: Hildred  . Number of children: 4  . Years of education: 68  . Highest education level: Not on file  Occupational History  . Occupation: Retired  Tobacco Use  . Smoking status: Never Smoker  . Smokeless tobacco: Never Used  Substance and Sexual Activity  . Alcohol use: No  Alcohol/week: 0.0 standard drinks  . Drug use: Never  . Sexual activity: Defer  Partners: Male  Other Topics Concern  . Not on file  Social History Narrative  . Not on file   Social Determinants of Health   Financial Resource Strain:  . Difficulty of Paying Living Expenses:  Food Insecurity:  . Worried About Charity fundraiser in the Last Year:  . Arboriculturist in the Last Year:  Transportation Needs:  . Film/video editor (Medical):  Marland Kitchen Lack of Transportation (Non-Medical):  Physical Activity:  . Days of Exercise per Week:  . Minutes of Exercise per Session:  Stress:  . Feeling of Stress :  Social Connections:  . Frequency of Communication with Friends and Family:  . Frequency of Social Gatherings with Friends and Family:  . Attends Religious Services:  . Active Member of Clubs or Organizations:  . Attends Archivist Meetings:  Marland Kitchen Marital Status:   Family History:  Family History  Problem Relation Age of Onset  . Heart disease Father  . Myocardial Infarction (Heart attack) Father  . Heart disease Brother  . Colon cancer Brother  . Heart disease  Brother  . Colon cancer Brother  . Heart disease Brother   Review of Systems:   A 10+ ROS was performed, reviewed, and the pertinent orthopaedic findings are documented in the HPI.   Physical Examination:   BP 148/58 (BP Location: Left upper arm, Patient Position: Sitting, BP Cuff Size: Adult)  Ht 160 cm (5\' 3" )  Wt 57.1 kg (125 lb 12.8 oz)  BMI 22.28 kg/m   Patient is a well-developed, well-nourished female in no acute distress. Patient has normal mood and affect. Patient is alert and oriented to person, place, and time.   HEENT: Atraumatic, normocephalic. Pupils equal and reactive to light. Extraocular motion intact. Noninjected sclera.  Cardiovascular: Regular rate and rhythm, with no murmurs, rubs, or gallops. Distal pulses palpable.  Respiratory: Lungs clear to auscultation bilaterally.   Left Knee: Soft tissue swelling: mild; a palpable popliteal cyst is present Effusion: none Erythema: none Crepitance: mild Tenderness: medial, lateral Alignment: relative valgus Mediolateral laxity: lateral pseudolaxity Posterior sag: negative Patellar tracking: Good tracking without evidence of subluxation or tilt  Atrophy: No significant atrophy.  Quadriceps tone was fair to good. Range of motion: 0/0/105 degrees   Sensation intact over the saphenous, lateral sural cutaneous, superficial fibular, and deep fibular nerve distributions.  Tests Performed/Reviewed:  X-rays  No new radiographs were obtained today. Previous radiographs were reviewed of the left knee and revealed complete loss of lateral compartment joint space with associated osteophyte formation and subchondral sclerosis of the bone. Lateral compartment reveals significant chondrocalcinosis. Patellofemoral joint reveals loss of joint space with osteophyte formation. No fractures or dislocations noted. Valgus alignment noted.  Impression:   ICD-10-CM  1. Primary osteoarthritis of left knee M17.12  2. History of  diabetes mellitus, type II Z86.39   Plan:   The patient has end-stage degenerative changes of the left knee. It was explained to the patient that the condition is progressive in nature. Having failed conservative treatment, the patient has elected to proceed with a total joint arthroplasty. The patient will undergo a total joint arthroplasty with Dr. Marry Guan. The risks of surgery, including blood clot and infection, were discussed with the patient. Measures to reduce these risks, including the use of anticoagulation, perioperative antibiotics, and early ambulation were discussed. The importance of postoperative physical therapy was discussed with the patient. The patient elects to proceed with surgery. The patient is instructed to stop all blood thinners prior to surgery. The patient is instructed to call the hospital the day before surgery to learn of the proper arrival time.   Contact our office with any questions or concerns. Follow up as indicated, or sooner should any new problems arise, if conditions worsen, or if they are otherwise concerned.   [name], PA [facility] [address], [place] [address] Phone: [phone]  This note was generated in part with voice recognition software and I apologize for any typographical errors that were not detected and corrected.   Electronically signed by [name], PA at 12/29/2019 6:59 PM EDT

## 2020-01-06 NOTE — Plan of Care (Signed)
New care plan initiated 

## 2020-01-06 NOTE — Anesthesia Preprocedure Evaluation (Addendum)
Anesthesia Evaluation  Patient identified by MRN, date of birth, ID band Patient awake    Reviewed: Allergy & Precautions, H&P , NPO status , Patient's Chart, lab work & pertinent test results, reviewed documented beta blocker date and time   Airway Mallampati: II   Neck ROM: full    Dental  (+) Poor Dentition   Pulmonary neg pulmonary ROS,    Pulmonary exam normal        Cardiovascular hypertension, (-) angina+ CAD and + Peripheral Vascular Disease  Normal cardiovascular exam+ dysrhythmias Atrial Fibrillation + Valvular Problems/Murmurs      Neuro/Psych Anxiety negative neurological ROS  negative psych ROS   GI/Hepatic negative GI ROS, Neg liver ROS, GERD  ,  Endo/Other  negative endocrine ROSdiabetes  Renal/GU Renal diseasenegative Renal ROS  negative genitourinary   Musculoskeletal  (+) Arthritis , Osteoarthritis,    Abdominal   Peds negative pediatric ROS (+)  Hematology negative hematology ROS (+) anemia ,   Anesthesia Other Findings Past Medical History:   Hyperlipidemia                                               Diabetes mellitus                                              Comment:non insulin dependent   Coronary artery disease                                      Atrial fibrillation (HCC)                                      Comment:history   Peripheral vascular disease (HCC)                            Hypertension                                                 Heart murmur                                                 GERD (gastroesophageal reflux disease)                       Anxiety                                                    Past Surgical History:   CARDIAC CATHETERIZATION  CORONARY ARTERY BYPASS GRAFT                                    Comment:with LIMA  to the LAD, SVG to OM1, SVG to PDA   APPENDECTOMY                                                   TONSILLECTOMY                                                 RENAL ARTERY STENT                                              Comment:left, By Dr Hulda Humphrey   REPLACEMENT TOTAL KNEE                           03/25/2013       Comment:right   CORONARY ANGIOPLASTY WITH STENT PLACEMENT                     CATARACT EXTRACTION W/PHACO                     Left 08/15/2015     Comment:Procedure: CATARACT EXTRACTION PHACO AND               INTRAOCULAR LENS PLACEMENT ([place]);  Surgeon:               Estill Cotta, MD;  Location: [facility] ORS;                Service: Ophthalmology;  Laterality: Left;  [place]               01:28 AP% 26.4 CDE 35.73 fluid pack #               [id number] H BMI    Body Mass Index   23.92 kg/m 2     Reproductive/Obstetrics                             Anesthesia Physical  Anesthesia Plan  ASA: III  Anesthesia Plan: General   Post-op Pain Management:    Induction: Intravenous  PONV Risk Score and Plan:   Airway Management Planned: Oral ETT  Additional Equipment:   Intra-op Plan:   Post-operative Plan: Extubation in OR  Informed Consent: I have reviewed the patients History and Physical, chart, labs and discussed the procedure including the risks, benefits and alternatives for the proposed anesthesia with the patient or authorized representative who has indicated his/her understanding and acceptance.     Dental Advisory Given  Plan Discussed with: CRNA  Anesthesia Plan Comments:        Anesthesia Quick Evaluation

## 2020-01-06 NOTE — Transfer of Care (Signed)
Immediate Anesthesia Transfer of Care Note  Patient: Tracey [name]  Procedure(s) Performed: COMPUTER ASSISTED TOTAL KNEE ARTHROPLASTY (Left Knee)  Patient Location: PACU  Anesthesia Type:General  Level of Consciousness: sedated  Airway & Oxygen Therapy: Patient Spontanous Breathing and Patient connected to face mask oxygen  Post-op Assessment: Report given to RN and Post -op Vital signs reviewed and stable  Post vital signs: Reviewed and stable  Last Vitals:  Vitals Value Taken Time  BP 163/60 01/06/20 1708  Temp 36.7 C 01/06/20 1708  Pulse 80 01/06/20 1712  Resp 10 01/06/20 1712  SpO2 99 % 01/06/20 1712  Vitals shown include unvalidated device data.  Last Pain:  Vitals:   01/06/20 1124  TempSrc: Temporal  PainSc: 0-No pain      Patients Stated Pain Goal: 0 ([id number] [phone])  Complications: No apparent anesthesia complications

## 2020-01-07 LAB — GLUCOSE, CAPILLARY
Glucose-Capillary: 210 mg/dL — ABNORMAL HIGH (ref 70–99)
Glucose-Capillary: 171 mg/dL — ABNORMAL HIGH (ref 70–99)
Glucose-Capillary: 227 mg/dL — ABNORMAL HIGH (ref 70–99)
Glucose-Capillary: 83 mg/dL (ref 70–99)
Glucose-Capillary: 123 mg/dL — ABNORMAL HIGH (ref 70–99)

## 2020-01-07 MED ORDER — SODIUM CHLORIDE 0.9 % IV BOLUS
250.0000 mL | Freq: Once | INTRAVENOUS | Status: AC
  Administered 2020-01-07: 23:00:00 250 mL via INTRAVENOUS

## 2020-01-07 NOTE — TOC Initial Note (Signed)
Transition of Care [facility]) - Initial/Assessment Note    Patient Details  Name: Tracey [name] MRN: [id number] Date of Birth: 1936/05/29  [facility] CM/SW Contact:    Su Hilt, RN Phone Number: 01/07/2020, 2:11 PM  Clinical Narrative:                 Met with the patient to discuss DC plan and needs She lives at home with her husband, has a W and a BSC at home, No DME needed, [facility] unable to accept insurance [facility] unable to accept [place], I requested [facility] to accept the patient and Corene Cornea will let me know  Expected Discharge Plan: [place] Barriers to Discharge: Continued Medical Work up   Patient Goals and CMS Choice Patient states their goals for this hospitalization and ongoing recovery are:: go home      Expected Discharge Plan and Services Expected Discharge Plan: [place]   Discharge Planning Services: CM Consult   Living arrangements for the past 2 months: Single Family Home                 DME Arranged: N/A         HH Arranged: PT HH Agency: [facility] ([place]) Date HH Agency Contacted: 01/07/20 Time HH Agency Contacted: 75 Representative spoke with at [place]: Corene Cornea  Prior Living Arrangements/Services Living arrangements for the past 2 months: [place] with:: Spouse Patient language and need for interpreter reviewed:: Yes Do you feel safe going back to the place where you live?: Yes      Need for Family Participation in Patient Care: No (Comment) Care giver support system in place?: Yes (comment) Current home services: DME(RW, BSC) Criminal Activity/Legal Involvement Pertinent to Current Situation/Hospitalization: No - Comment as needed  Activities of Daily Living Home Assistive Devices/Equipment: Wheelchair, Dentures (specify type), Eyeglasses ADL Screening (condition at time of admission) Patient's cognitive ability adequate to safely complete daily  activities?: Yes Is the patient deaf or have difficulty hearing?: No Does the patient have difficulty seeing, even when wearing glasses/contacts?: No Does the patient have difficulty concentrating, remembering, or making decisions?: No Patient able to express need for assistance with ADLs?: Yes Does the patient have difficulty dressing or bathing?: No Independently performs ADLs?: Yes (appropriate for developmental age) Does the patient have difficulty walking or climbing stairs?: Yes Weakness of Legs: Both Weakness of Arms/Hands: None  Permission Sought/Granted   Permission granted to share information with : Yes, Verbal Permission Granted              Emotional Assessment Appearance:: Appears stated age Attitude/Demeanor/Rapport: Engaged Affect (typically observed): Appropriate Orientation: : Oriented to Situation, Oriented to  Time, Oriented to Place, Oriented to Self Alcohol / Substance Use: Not Applicable Psych Involvement: No (comment)  Admission diagnosis:  Total knee replacement status [Z96.659] Patient Active Problem List   Diagnosis Date Noted  . Total knee replacement status 01/06/2020  . Rectocele 09/27/2019  . Vaginal atrophy 09/27/2019  . UTI symptoms 09/27/2019  . Renal artery stenosis ([place]) 06/30/2019  . Diarrhea 06/16/2019  . Varicose veins of both lower extremities with inflammation 05/27/2019  . Essential hypertension, benign 05/07/2019  . Coronary artery disease of native artery of native heart with stable angina pectoris ([place]) 04/01/2019  . Medicare annual wellness visit, subsequent 04/01/2019  . Syncope 12/25/2018  . Palpitations 12/25/2018  . PAD (peripheral artery disease) ([place]) 12/25/2018  .  Stenosis of left carotid artery 12/25/2018  . Abnormal urinalysis 10/15/2018  . Primary osteoarthritis of left knee 04/27/2018  . Weight loss 01/13/2018  . PAF (paroxysmal atrial fibrillation) ([place]) 11/11/2017  . Routine general medical examination at a  health care facility 11/11/2017  . Renal insufficiency 11/11/2017  . Osteopenia 04/30/2017  . Anemia 02/12/2017  . Vitamin D deficiency 02/03/2017  . Well woman exam 11/05/2016  . Vitamin B12 deficiency 11/05/2016  . Change in bowel habits 03/07/2015  . FH: colon cancer 03/07/2015  . GERD (gastroesophageal reflux disease) 03/07/2015  . Osteoarthritis 03/15/2014  . Hypertension 01/22/2012  . CAD (coronary artery disease) 01/22/2012  . Status post aorto-coronary artery bypass graft 01/22/2012  . Mixed hyperlipidemia 01/22/2012  . Type 2 diabetes mellitus with stage 2 chronic kidney disease, without long-term current use of insulin ([place]) 01/22/2012  . Atherosclerosis of renal artery ([place]) 01/22/2012   PCP:  Abner Greenspan, MD Pharmacy:   [id number] [place], [place] - [address] [place] [address]Phone: [phone]: [phone]    Social Determinants of Health (SDOH) Interventions    Readmission Risk Interventions No flowsheet data found.

## 2020-01-07 NOTE — Progress Notes (Signed)
  Subjective: 1 Day Post-Op Procedure(s) (LRB): COMPUTER ASSISTED TOTAL KNEE ARTHROPLASTY (Left) Patient reports pain as well-controlled.   Patient is well, and has had no acute complaints or problems except for inability to void overnight. Plan is to go Home after hospital stay. Negative for chest pain and shortness of breath Fever: no Gastrointestinal: negative for nausea and vomiting.   Patient has not had a bowel movement.  Objective: Vital signs in last 24 hours: Temp:  [97.1 F (36.2 C)-98.1 F (36.7 C)] 98 F (36.7 C) (04/21 2122) Pulse Rate:  [47-85] 52 (04/22 0451) Resp:  [0-20] 18 (04/22 0451) BP: (120-174)/(44-91) 142/91 (04/22 0451) SpO2:  [94 %-100 %] 100 % (04/22 0451) Weight:  [56.2 kg] 56.2 kg (04/21 1124)  Intake/Output from previous day:  Intake/Output Summary (Last 24 hours) at 01/07/2020 0800 Last data filed at 01/07/2020 0531 Gross per 24 hour  Intake 2512.23 ml  Output 1015 ml  Net 1497.23 ml    Intake/Output this shift: No intake/output data recorded.  Labs: No results for input(s): HGB in the last 72 hours. No results for input(s): WBC, RBC, HCT, PLT in the last 72 hours. No results for input(s): NA, K, CL, CO2, BUN, CREATININE, GLUCOSE, CALCIUM in the last 72 hours. No results for input(s): LABPT, INR in the last 72 hours.   EXAM General - Patient is Alert, Appropriate and Oriented Extremity - Neurovascular intact Dorsiflexion/Plantar flexion intact Compartment soft Dressing/Incision -Postoperative dressing remains in place., Hemovac in place.  Motor Function - intact, moving foot and toes well on exam. Polar Care in place but without water so not providing benefit to patient. Bone Foam not seen in room. Cardiovascular- Regular rate and rhythm, no murmurs/rubs/gallops Respiratory- Lungs clear to auscultation bilaterally Gastrointestinal- soft, nontender and active bowel sounds   Assessment/Plan: 1 Day Post-Op Procedure(s) (LRB): COMPUTER  ASSISTED TOTAL KNEE ARTHROPLASTY (Left) Active Problems:   Total knee replacement status  Estimated body mass index is 21.95 kg/m as calculated from the following:   Height as of this encounter: 5\' 3"  (1.6 m).   Weight as of this encounter: 56.2 kg. Advance diet Up with therapy Plan for discharge tomorrow  Polar Care filled with water.   DVT Prophylaxis - Lovenox, Ted hose and foot pumps Weight-Bearing as tolerated to left leg  Cassell Smiles, PA-C [facility] Orthopaedic Surgery 01/07/2020, 8:00 AM

## 2020-01-07 NOTE — Anesthesia Postprocedure Evaluation (Signed)
Anesthesia Post Note  Patient: Tracey [name]  Procedure(s) Performed: COMPUTER ASSISTED TOTAL KNEE ARTHROPLASTY (Left Knee)  Patient location during evaluation: Nursing Unit Anesthesia Type: Spinal Level of consciousness: awake, oriented and awake and alert Pain management: pain level controlled Vital Signs Assessment: post-procedure vital signs reviewed and stable Respiratory status: nonlabored ventilation, spontaneous breathing and respiratory function stable Cardiovascular status: blood pressure returned to baseline and stable Postop Assessment: no headache and no backache Anesthetic complications: no     Last Vitals:  Vitals:   01/07/20 0451 01/07/20 0802  BP: (!) 142/91 (!) 121/42  Pulse: (!) 52 (!) 51  Resp: 18 16  Temp:  36.6 C  SpO2: 100% 99%    Last Pain:  Vitals:   01/07/20 1000  TempSrc:   PainSc: 0-No pain                 [employer]

## 2020-01-07 NOTE — Evaluation (Signed)
Physical Therapy Evaluation Patient Details Name: Tracey [name] MRN: [id number] DOB: 03/06/1936 Today's Date: 01/07/2020   History of Present Illness  Pt is an 84 y.o. female with PMH of anemia, CAD, DM II, colon cancer, GERD, HLD, and HTN with degenerative arthrosis of the left knee who is now s/p elective L TKA.    Clinical Impression  Pt pleasant and motivated to participate during the session. Pt found on RA and HR and SpO2 were WNL t/o the session. Pt did not require physical assistance t/o the session and was able to ambulate 160 feet without any significant levels of pain or fatigue. With gait, pt had a steppage pattern with her LLE likely secondary to a decrease in dorsiflexion that was previously noted with supine therex- PA and nsg aware. Pt also had a scissoring gait pattern and was later educated on importance of not crossing her feet when walking. It is unclear whether her scissoring gait pattern and limited L dorsiflexion are new or if they developed during the previous month that pt used a wheelchair for all mobility. Pt will benefit from HHPT services upon discharge to safely address deficits listed in patient problem list for decreased caregiver assistance and eventual return to PLOF.       Follow Up Recommendations Home health PT    Equipment Recommendations  None recommended by PT    Recommendations for Other Services       Precautions / Restrictions Precautions Precautions: Fall Precaution Comments: Moderate Fall Restrictions Weight Bearing Restrictions: Yes LLE Weight Bearing: Weight bearing as tolerated      Mobility  Bed Mobility Overal bed mobility: Modified Independent              Transfers Overall transfer level: Needs assistance Equipment used: Rolling walker (2 wheeled) Transfers: Sit to/from Stand Sit to Stand: Min guard         General transfer comment: Cueing provided for hand and foot placement  Ambulation/Gait Ambulation/Gait  assistance: Min guard Gait Distance (Feet): 160 Feet   Gait Pattern/deviations: Steppage;Step-through pattern;Scissoring;Narrow base of support Gait velocity: decreased   General Gait Details: Pt had a step-through gait with a sissoring pattern- pt educated on risk of falling with sissoring pattern. Pt had a steppage gait on the L likely secondary to decreased DF.  Stairs            Wheelchair Mobility    Modified Rankin (Stroke Patients Only)       Balance Overall balance assessment: Needs assistance Sitting-balance support: No upper extremity supported;Feet unsupported Sitting balance-Leahy Scale: Good     Standing balance support: Bilateral upper extremity supported;During functional activity Standing balance-Leahy Scale: Good Standing balance comment: Able to stand statically without UE assist. During ambulation, pt used a min-mod BUE assist through the RW                             Pertinent Vitals/Pain Pain Assessment: No/denies pain(denied pain at rest, some pain noted with therex and ambulation)    Home Living Family/patient expects to be discharged to:: Private residence Living Arrangements: Alone Available Help at Discharge: Family;Available 24 hours/day(Pt reports her granddaughter and friend will be staying with her for the first few weeks after sx.) Type of Home: House Home Access: Stairs to enter Entrance Stairs-Rails: Right;Left(could not reach both) Entrance Stairs-Number of Steps: 3 Home Layout: One level Home Equipment: Walker - 2 wheels;Cane - single point;Hand held shower head;Shower seat;Wheelchair -  manual;Grab bars - tub/shower;Grab bars - toilet      Prior Function Level of Independence: Independent with assistive device(s)         Comments: Pt reports using a wheelchair for functional mobility for last 4 weeks 2/2 significant L knee pain. Othwerwise she was independent for all ADL/IADL. Leaves the house less now due to COVID.  Denies falls     Hand Dominance   Dominant Hand: Right    Extremity/Trunk Assessment       Lower Extremity Assessment Lower Extremity Assessment: LLE deficits/detail;Generalized weakness LLE Deficits / Details: s/p L TKA. Decrease in L DF ROM and strength - nsg and MD notified LLE: Unable to fully assess due to pain LLE Sensation: WNL       Communication   Communication: No difficulties  Cognition Arousal/Alertness: Awake/alert Behavior During Therapy: WFL for tasks assessed/performed Overall Cognitive Status: Within Functional Limits for tasks assessed                                 General Comments: Pt A&O x4.      General Comments      Exercises Total Joint Exercises Ankle Circles/Pumps: AROM;Both;10 reps(decreased DF on the L) Quad Sets: Strengthening;Both;10 reps Gluteal Sets: Strengthening;Both;10 reps Short Arc Quad: AROM;Strengthening;Left;10 reps Heel Slides: AROM;Strengthening;Left;10 reps(Overpressure with knee flexion, maual resistance when extending the knee) Hip ABduction/ADduction: AROM;Strengthening;Both;10 reps Straight Leg Raises: AROM;Strengthening;Both;10 reps Long Arc Quad: AROM;Strengthening;Both;10 reps Knee Flexion: AROM;Strengthening;Both;10 reps Goniometric ROM: L knee AROM: 0-90 deg Other Exercises Other Exercises: Pt educated on WBS and HEP Other Exercises: Per ambulation section: pt educated on her gait pattern   Assessment/Plan    PT Assessment Patient needs continued PT services  PT Problem List Decreased strength;Decreased mobility;Decreased range of motion;Decreased knowledge of precautions;Decreased activity tolerance;Decreased balance;Decreased knowledge of use of DME       PT Treatment Interventions DME instruction;Therapeutic exercise;Gait training;Balance training;Stair training;Functional mobility training;Therapeutic activities;Patient/family education    PT Goals (Current goals can be found in the Care  Plan section)  Acute Rehab PT Goals Patient Stated Goal: "to get up and move" PT Goal Formulation: With patient Time For Goal Achievement: 01/20/20 Potential to Achieve Goals: Good    Frequency BID   Barriers to discharge        Co-evaluation               AM-PAC PT "6 Clicks" Mobility  Outcome Measure Help needed turning from your back to your side while in a flat bed without using bedrails?: None Help needed moving from lying on your back to sitting on the side of a flat bed without using bedrails?: None Help needed moving to and from a bed to a chair (including a wheelchair)?: A Little Help needed standing up from a chair using your arms (e.g., wheelchair or bedside chair)?: A Little Help needed to walk in hospital room?: A Little Help needed climbing 3-5 steps with a railing? : A Little 6 Click Score: 20    End of Session Equipment Utilized During Treatment: Gait belt Activity Tolerance: Patient tolerated treatment well Patient left: in chair;with call bell/phone within reach;with chair alarm set;with SCD's reapplied;with family/visitor present Nurse Communication: Mobility status;Precautions;Weight bearing status;Other (comment)(Pt had limited DF of the L ankle and a rash on her L arm) PT Visit Diagnosis: Other abnormalities of gait and mobility (R26.89);Muscle weakness (generalized) (M62.81)    Time: [id number] PT Time Calculation (min) (  ACUTE ONLY): 52 min   Charges:             Annabelle Harman, SPT 01/07/20 1:23 PM

## 2020-01-07 NOTE — Progress Notes (Signed)
Physical Therapy Treatment Patient Details Name: Tracey [name] MRN: [id number] DOB: Dec 28, 1935 Today's Date: 01/07/2020    History of Present Illness Pt is an 84 y.o. female with PMH of anemia, CAD, DM II, colon cancer, GERD, HLD, and HTN with degenerative arthrosis of the left knee who is now s/p elective L TKA.    PT Comments    Pt pleasant and motivated to participate during the session. Pt did not require physical assistance with bed mobility, transfers, ambulation, or stair training and was able to transfer to/from low surfaces in a controlled manner, ambulate a total of 220 feet, and go up and down stairs only holding onto one railing gently with one UE. Pt continued to have decreased L ankle DF and also had no active great toe extension-PA and nsg notified. With ambulation and stair training, pt used a steppage gait to accommodate for these impairments. Pt demonstrated good carryover with gait training and with her HEP. Pt also demonstrated good understanding of sequencing for stairs and her friend, who she lives with, was also educated and present for stair training. Pt will benefit from HHPT services upon discharge to safely address deficits listed in patient problem list for decreased caregiver assistance and eventual return to PLOF.      Follow Up Recommendations  Home health PT     Equipment Recommendations  None recommended by PT    Recommendations for Other Services       Precautions / Restrictions Precautions Precautions: Fall Precaution Comments: Moderate Fall Restrictions Weight Bearing Restrictions: Yes LLE Weight Bearing: Weight bearing as tolerated    Mobility  Bed Mobility Overal bed mobility: Modified Independent               Transfers Overall transfer level: Needs assistance Equipment used: Rolling walker (2 wheeled) Transfers: Sit to/from Stand Sit to Stand: Modified independent (Device/Increase time)         General transfer comment: Good  carryover for hand and foot placement. Able to transfer to/from low toilet seat in bathroom without BSC over top of toilet without noted difficulty.  Ambulation/Gait Ambulation/Gait assistance: Supervision Gait Distance (Feet): 120 Feetx1, 80x1, 20x1 Assistive device: Rolling walker (2 wheeled) Gait Pattern/deviations: Steppage;Step-through pattern;Scissoring;Narrow base of support Gait velocity: decreased   General Gait Details: Pt had a step-through gait with a occasional sissoring pattern. Pt had good carryovet o widen BOS but on several steps (<10% of total steps), pt still displayed a sissoring pattern. Pt continued to have a steppage gait on the L likely secondary to decreased DF.   Stairs Stairs: Yes Stairs assistance: Supervision Stair Management: One rail Left;Forwards;Backwards;Step to pattern Number of Stairs: 3 steps x2, 1 step x2 General stair comments: Pt started by doing 1 step with 2 rails, then progressed to 1 step with just one rail. Per pt preference, pt then went up and down 3 stairs just using R railing. Pt then encouraged to use the left railing instead to have support on the surgical side and went up and down 3 stairs. With all trials, pt did well without any instances of instability or visible difficulties. Pt educated on sequencing and demonstarted good understand. Pt's friend, who she lives with, was educated on sequencing as well. Steppage gait on the L noted likely secondary to decreased DF.   Wheelchair Mobility    Modified Rankin (Stroke Patients Only)       Balance Overall balance assessment: Needs assistance Sitting-balance support: No upper extremity supported;Feet unsupported Sitting balance-Leahy Scale:  Good     Standing balance support: Bilateral upper extremity supported;During functional activity Standing balance-Leahy Scale: Good Standing balance comment: Able to stand statically without UE assist. During ambulation, pt used a min-BUE assist  through the RW                            Cognition Arousal/Alertness: Awake/alert Behavior During Therapy: [facility] for tasks assessed/performed Overall Cognitive Status: Within Functional Limits for tasks assessed                                      Exercises Total Joint Exercises Ankle Circles/Pumps: AROM;Both;10 reps(decreased DF on the L) Quad Sets: Strengthening;Both;10 reps Gluteal Sets: Strengthening;Both;10 reps Heel Slides: AROM;Strengthening;Left;10 reps(Overpressure with knee flexion, maual resistance when extending the knee) Hip ABduction/ADduction: AROM;Strengthening;Both;10 reps Straight Leg Raises: AROM;Strengthening;Both;10 reps Long Arc Quad: AROM;Strengthening;Both;10 reps Knee Flexion: AROM;Strengthening;Both;10 reps Other Exercises Other Exercises: Pt educated on WBS and HEP Other Exercises: Stair training with friend present - pt and friend educated on sequencing and demonstarted understanding    General Comments        Pertinent Vitals/Pain Pain Assessment: No/denies pain(denied pain at rest, minimal pain noted with therex and ambulation)    Home Living Family/patient expects to be discharged to:: Private residence Living Arrangements: Alone Available Help at Discharge: Family;Available 24 hours/day(Pt reports her granddaughter and friend will be staying with her for the first few weeks after sx.) Type of Home: House Home Access: Stairs to enter Entrance Stairs-Rails: Right;Left(could not reach both) Home Layout: One level Home Equipment: Walker - 2 wheels;Cane - single point;Hand held shower head;Shower seat;Wheelchair - manual;Grab bars - tub/shower;Grab bars - toilet      Prior Function Level of Independence: Independent with assistive device(s)      Comments: Pt reports using a wheelchair for functional mobility for last 4 weeks 2/2 significant L knee pain. Othwerwise she was independent for all ADL/IADL. Leaves the house  less now due to COVID. Denies falls   PT Goals (current goals can now be found in the care plan section) Acute Rehab PT Goals Patient Stated Goal: "to get up and move" PT Goal Formulation: With patient Time For Goal Achievement: 01/20/20 Potential to Achieve Goals: Good Progress towards PT goals: Progressing toward goals    Frequency    BID      PT Plan      Co-evaluation              AM-PAC PT "6 Clicks" Mobility   Outcome Measure  Help needed turning from your back to your side while in a flat bed without using bedrails?: None Help needed moving from lying on your back to sitting on the side of a flat bed without using bedrails?: None Help needed moving to and from a bed to a chair (including a wheelchair)?: A Little Help needed standing up from a chair using your arms (e.g., wheelchair or bedside chair)?: A Little Help needed to walk in hospital room?: A Little Help needed climbing 3-5 steps with a railing? : A Little 6 Click Score: 20    End of Session Equipment Utilized During Treatment: Gait belt Activity Tolerance: Patient tolerated treatment well Patient left: in chair;with call bell/phone within reach;with chair alarm set;with SCD's reapplied;with family/visitor present Nurse Communication: Mobility status;Precautions;Weight bearing status(Pt had continued decrease in L DF ROM along with  no L GTE) PT Visit Diagnosis: Other abnormalities of gait and mobility (R26.89);Muscle weakness (generalized) (M62.81)     Time: [id number] PT Time Calculation (min) (ACUTE ONLY): 45 min  Charges:  $Gait Training: 8-22 mins $Therapeutic Exercise: 8-22 mins                     Annabelle Harman, SPT 01/07/20 4:23 PM

## 2020-01-07 NOTE — Progress Notes (Signed)
  Subjective: 1 Day Post-Op Procedure(s) (LRB): COMPUTER ASSISTED TOTAL KNEE ARTHROPLASTY (Left) Patient seated comfortably in bedside chair upon entering the room. Husband present. Patient has no complaints with pain at this time.   Objective: Vital signs in last 24 hours: Temp:  [97.5 F (36.4 C)-98.1 F (36.7 C)] 98.1 F (36.7 C) (04/22 1557) Pulse Rate:  [47-63] 54 (04/22 1557) Resp:  [16-18] 18 (04/22 1557) BP: (120-160)/(42-91) 131/46 (04/22 1557) SpO2:  [96 %-100 %] 100 % (04/22 1557)  Intake/Output from previous day:  Intake/Output Summary (Last 24 hours) at 01/07/2020 1830 Last data filed at 01/07/2020 1500 Gross per 24 hour  Intake 2419.05 ml  Output 775 ml  Net 1644.05 ml    Intake/Output this shift: Total I/O In: 1206.8 [P.O.:240; I.V.:486.4; IV Piggyback:480.4] Out: 60 [Drains:60]  Labs: No results for input(s): HGB in the last 72 hours. No results for input(s): WBC, RBC, HCT, PLT in the last 72 hours. No results for input(s): NA, K, CL, CO2, BUN, CREATININE, GLUCOSE, CALCIUM in the last 72 hours. No results for input(s): LABPT, INR in the last 72 hours.   EXAM General - Patient is Alert, Appropriate and Oriented Extremity - Neurovascular intact Compartment soft Dressing/Incision -Postoperative dressing remains in place., Hemovac in place.  Motor Function - Unable to actively dorsiflex the L ankle, extend the great toe    Assessment/Plan: 1 Day Post-Op Procedure(s) (LRB): COMPUTER ASSISTED TOTAL KNEE ARTHROPLASTY (Left) Active Problems:   Total knee replacement status  Estimated body mass index is 21.95 kg/m as calculated from the following:   Height as of this encounter: 5\' 3"  (1.6 m).   Weight as of this encounter: 56.2 kg. Advance diet Up with therapy Plan for discharge tomorrow  Polar Care refilled with ice. Additional pillow placed under patient's L knee. Patient instructed to put pillow under L knee when she transfers back to the  bed.  Bone Foam is to be discontinued at this time. Do not resume until active dorsiflexion returns.  DVT Prophylaxis - Lovenox, Ted hose and foot pumps Weight-Bearing as tolerated to left leg  Cassell Smiles, PA-C [facility], [facility] 01/07/2020, 6:30 PM

## 2020-01-07 NOTE — Progress Notes (Signed)
Patient has not voided during the night. Standing orders in place for bladder scan/ straight cath. Performed bladder scan = 448ml, straight cathed 630ml. Prior to straight cathing patient, patient states little to no pressure or pain. Patient states she feels like she has to void but can't. Patient tolerated being straight cathed with no discomfort. Will continue to monitor to end of shift.

## 2020-01-07 NOTE — Plan of Care (Signed)

## 2020-01-07 NOTE — Addendum Note (Signed)
Addendum  created 01/07/20 1543 by Johnna Acosta, CRNA   Clinical Note Signed

## 2020-01-07 NOTE — Progress Notes (Signed)
MD on call notified of pt's soft bp and MAP. Orders for 250cc bolus x 1 and encourage PO intake. Will give dose and recheck BP.

## 2020-01-07 NOTE — Progress Notes (Signed)
ORTHOPAEDICS: Physical therapy reported weakness of left toe and left ankle dorsiflexion earlier today. Ankle dorsiflexion was intact on morning rounds.  Deficit apparently occurred after full extension in the bone foam. Orders for bone foam and avoidance of pillow under the knee have been discontinued.  Order has been placed for flexion of the knee with a pillow under the left knee so as to prevent additional traction to the peroneal nerve.  Bulky dressing was also removed to minimize any extrinsic pressure.  There does appear to be some improvement with active ankle dorsiflexion this evening.  Will monitor closely.  Samuel Mcpeek P. Holley Bouche M.D.

## 2020-01-07 NOTE — Anesthesia Postprocedure Evaluation (Addendum)
Anesthesia Post Note  Patient: Tracey [name]  Procedure(s) Performed: COMPUTER ASSISTED TOTAL KNEE ARTHROPLASTY (Left Knee)  Patient location during evaluation: PACU Anesthesia Type: Spinal Level of consciousness: awake and alert and oriented Vital Signs Assessment: post-procedure vital signs reviewed and stable Respiratory status: spontaneous breathing Cardiovascular status: blood pressure returned to baseline Anesthetic complications: no Comments: Patient refused spinal for anesthetic.     Last Vitals:  Vitals:   01/06/20 2341 01/07/20 0451  BP: (!) 120/58 (!) 142/91  Pulse: (!) 49 (!) 52  Resp: 17 18  Temp:    SpO2: 99% 100%    Last Pain:  Vitals:   01/06/20 2138  TempSrc:   PainSc: Asleep                 [name]

## 2020-01-07 NOTE — Evaluation (Addendum)
Occupational Therapy Evaluation Patient Details Name: Tracey [name] MRN: [id number] DOB: December 22, 1935 Today's Date: 01/07/2020    History of Present Illness Tracey [name] is an 84 y.o. female with PMH of anemia, CAD, DM II, and CAD who is now s/p elective L TKA.   Clinical Impression   Tracey [name] was seen for OT evaluation this date, POD#1 from above surgery. Pt was independent in all ADLs prior to surgery, however she reports using a manual wheelchair for all functional mobility for the last four weeks prior to surgery due to significant L knee pain. Pt is eager to return to PLOF with less pain and improved safety and independence. Pt currently requires minimal assist for LB dressing while in seated position due to pain and limited AROM of L knee. Pt instructed in polar care mgt, falls prevention strategies, home/routines modifications, DME/AE for LB bathing and dressing tasks, and compression stocking mgt. Pt would benefit from skilled OT services including additional instruction in dressing techniques with or without assistive devices for dressing and bathing skills to support recall and carryover prior to discharge and ultimately to maximize safety, independence, and minimize falls risk and caregiver burden. Do not currently anticipate any OT needs following this hospitalization.       Follow Up Recommendations  No OT follow up    Equipment Recommendations  3 in 1 bedside commode    Recommendations for Other Services       Precautions / Restrictions Precautions Precautions: Fall Precaution Comments: Moderate Fall Restrictions Weight Bearing Restrictions: Yes LLE Weight Bearing: Weight bearing as tolerated      Mobility Bed Mobility               General bed mobility comments: Deferred. Pt up in recliner at start/end of session.  Transfers Overall transfer level: Needs assistance Equipment used: Rolling walker (2 wheeled) Transfers: Sit to/from Stand Sit to Stand:  Supervision              Balance Overall balance assessment: No apparent balance deficits (not formally assessed)                                         ADL either performed or assessed with clinical judgement   ADL Overall ADL's : Needs assistance/impaired                                       General ADL Comments: Pt is functionally limited by increased pain and decreased AROM of her L knee. She requires min A for LB ADL management in seated position. Supervision functional mobility using a 2WW.     Vision Baseline Vision/History: Wears glasses Wears Glasses: Reading only Patient Visual Report: No change from baseline       Perception     Praxis      Pertinent Vitals/Pain Pain Assessment: No/denies pain     Hand Dominance Right   Extremity/Trunk Assessment Upper Extremity Assessment Upper Extremity Assessment: Overall WFL for tasks assessed   Lower Extremity Assessment Lower Extremity Assessment: Defer to PT evaluation;LLE deficits/detail LLE Deficits / Details: s/p L TKA       Communication Communication Communication: No difficulties   Cognition Arousal/Alertness: Awake/alert Behavior During Therapy: WFL for tasks assessed/performed Overall Cognitive Status: Within Functional Limits for tasks assessed  General Comments: Pt A&O x4.   General Comments       Exercises Other Exercises Other Exercises: Pt educated on falls prevention strategies, safe use of AE/DME for ADL management, compression stocking management strategies, and routines modifications to support safety and fxl independence upon hospital DC. Pt return demonstrates understanding of AE education through use of sock aid and LH reacher for LB dressing task with supervision for safety and min cueing for technique.   Shoulder Instructions      Home Living Family/patient expects to be discharged to:: Private  residence Living Arrangements: Alone Available Help at Discharge: Family;Available 24 hours/day(Pt reports her granddaughter and signiticant other will be staying with her for the first few weeks after sx.) Type of Home: House Home Access: Stairs to enter [employer] of Steps: 3 Entrance Stairs-Rails: Right;Left(could not reach both) Home Layout: One level     Bathroom Shower/Tub: Occupational psychologist: Handicapped height     Home Equipment: Environmental consultant - 2 wheels;Cane - single point;Hand held shower head;Shower seat;Wheelchair - manual          Prior Functioning/Environment Level of Independence: Independent with assistive device(s)        Comments: Pt reports using a wheelchair for functional mobility for last 4 weeks 2/2 significant L knee pain. Othwerwise she was independent for all ADL/IADL. Leaves the house less now due to COVID.        OT Problem List: Decreased strength;Decreased coordination;Decreased activity tolerance;Decreased safety awareness;Impaired balance (sitting and/or standing);Decreased knowledge of use of DME or AE;Decreased knowledge of precautions      OT Treatment/Interventions: Self-care/ADL training;Therapeutic exercise;Therapeutic activities;DME and/or AE instruction;Patient/family education;Balance training    OT Goals(Current goals can be found in the care plan section) Acute Rehab OT Goals Patient Stated Goal: To go home OT Goal Formulation: With patient Time For Goal Achievement: 01/21/20 Potential to Achieve Goals: Good ADL Goals Pt Will Perform Lower Body Dressing: with modified independence;sit to/from stand(With LRAD PRN for improved safety and functional independence.) Pt Will Transfer to Toilet: bedside commode;with modified independence;ambulating(With LRAD PRN for improved safety and functional independence.) Pt Will Perform Toileting - Clothing Manipulation and hygiene: sit to/from stand;with modified  independence;with adaptive equipment(With LRAD PRN for improved safety and functional independence.)  OT Frequency: Min 1X/week   Barriers to D/C:            Co-evaluation              AM-PAC OT "6 Clicks" Daily Activity     Outcome Measure Help from another person eating meals?: None Help from another person taking care of personal grooming?: A Little Help from another person toileting, which includes using toliet, bedpan, or urinal?: A Little Help from another person bathing (including washing, rinsing, drying)?: A Little Help from another person to put on and taking off regular upper body clothing?: A Little Help from another person to put on and taking off regular lower body clothing?: A Little 6 Click Score: 19   End of Session Equipment Utilized During Treatment: Gait belt;Rolling walker  Activity Tolerance: Patient tolerated treatment well Patient left: in chair;with call bell/phone within reach;with chair alarm set;Other (comment)(With polar care in place and active)  OT Visit Diagnosis: Other abnormalities of gait and mobility (R26.89)                Time: [id number] OT Time Calculation (min): 29 min Charges:  OT General Charges $OT Visit: 1 Visit OT Evaluation $OT Eval Low  Complexity: 1 Low OT Treatments $Self Care/Home Management : 23-37 mins  Shara Blazing, M.S., OTR/L Ascom: [phone] 01/07/20, 12:37 PM

## 2020-01-07 NOTE — Progress Notes (Signed)
Patient noted to have red area to LFA, as well as chest , no raised area, patient denies itching . Notified Tamala Julian , PA - C. No new orders at this time. Will continue to monitor

## 2020-01-08 LAB — GLUCOSE, CAPILLARY
Glucose-Capillary: 118 mg/dL — ABNORMAL HIGH (ref 70–99)
Glucose-Capillary: 167 mg/dL — ABNORMAL HIGH (ref 70–99)

## 2020-01-08 MED ORDER — CELECOXIB 200 MG PO CAPS: 200.0000 mg | ORAL_CAPSULE | Freq: Two times a day (BID) | ORAL | 0 refills | Status: DC

## 2020-01-08 MED ORDER — ENOXAPARIN SODIUM 40 MG/0.4ML ~~LOC~~ SOLN: 40.0000 mg | SUBCUTANEOUS | 0 refills | Status: DC

## 2020-01-08 MED ORDER — TRAMADOL HCL 50 MG PO TABS: 50.0000 mg | ORAL_TABLET | ORAL | 0 refills | Status: DC | PRN

## 2020-01-08 NOTE — Progress Notes (Signed)
Physical Therapy Treatment Patient Details Name: Tracey [name] MRN: [id number] DOB: [birth year] Today's Date: 01/08/2020    History of Present Illness Pt is an 84 y.o. female with PMH of anemia, CAD, DM II, colon cancer, GERD, HLD, and HTN with degenerative arthrosis of the left knee who is now s/p elective L TKA.    PT Comments    Pt pleasant and motivated to participate during the session.  Pt presented with significantly improved L ankle DF and great toe extension compared to prior session with both grossly WNL and equal compared to the R side.  Pt did present with a decrease in L knee ext AROM from 0 deg yesterday to lacking 10 deg today, PA notified.  Pt continues to progress very well towards all goals and was able to amb 200+ feet and ascend/descend 4 steps with only one rail all with good control and stability and without adverse symptoms other than mild L knee pain.  Pt will benefit from HHPT services upon discharge to safely address deficits listed in patient problem list for decreased caregiver assistance and eventual return to PLOF.     Follow Up Recommendations  Home health PT     Equipment Recommendations  None recommended by PT    Recommendations for Other Services       Precautions / Restrictions Precautions Precautions: Fall Restrictions Weight Bearing Restrictions: Yes LLE Weight Bearing: Weight bearing as tolerated Other Position/Activity Restrictions: No bone foam and keep pillow under knee    Mobility  Bed Mobility Overal bed mobility: Modified Independent             General bed mobility comments: Extra time and effort required but no physical assistance needed  Transfers Overall transfer level: Needs assistance Equipment used: Rolling walker (2 wheeled) Transfers: Sit to/from Stand Sit to Stand: Modified independent (Device/Increase time)         General transfer comment: Good eccentric and concentric control and  stability  Ambulation/Gait Ambulation/Gait assistance: Supervision Gait Distance (Feet): 200 Feet Assistive device: Rolling walker (2 wheeled) Gait Pattern/deviations: Step-through pattern;Decreased step length - right;Decreased step length - left Gait velocity: decreased   General Gait Details: Mildly reduced cadence but steady with step-through pattern   Stairs Stairs: Yes Stairs assistance: Supervision Stair Management: One rail Right;Step to pattern Number of Stairs: 4 General stair comments: Good carryover regarding proper sequencing ascending and descending steps with good stability   Wheelchair Mobility    Modified Rankin (Stroke Patients Only)       Balance Overall balance assessment: Needs assistance Sitting-balance support: No upper extremity supported;Feet unsupported Sitting balance-Leahy Scale: Normal     Standing balance support: Bilateral upper extremity supported;During functional activity Standing balance-Leahy Scale: Good                              Cognition Arousal/Alertness: Awake/alert Behavior During Therapy: WFL for tasks assessed/performed Overall Cognitive Status: Within Functional Limits for tasks assessed                                        Exercises Total Joint Exercises Ankle Circles/Pumps: AROM;Both;10 reps Quad Sets: Strengthening;Both;10 reps;AAROM Gluteal Sets: Strengthening;Both;10 reps Long Arc Quad: AROM;Strengthening;Both;10 reps;5 reps Knee Flexion: AROM;Strengthening;Both;10 reps;5 reps Goniometric ROM: L knee AROM: 10-94 deg Marching in Standing: AROM;Both;10 reps;Standing Other Exercises Other Exercises: HEP education and  review per handout    General Comments        Pertinent Vitals/Pain Pain Assessment: 0-10 Pain Score: 4  Pain Location: L knee with activity Pain Descriptors / Indicators: Sore Pain Intervention(s): Monitored during session;Premedicated before session    Home  Living                      Prior Function            PT Goals (current goals can now be found in the care plan section) Progress towards PT goals: Progressing toward goals    Frequency    BID      PT Plan Current plan remains appropriate    Co-evaluation              AM-PAC PT "6 Clicks" Mobility   Outcome Measure  Help needed turning from your back to your side while in a flat bed without using bedrails?: None Help needed moving from lying on your back to sitting on the side of a flat bed without using bedrails?: None Help needed moving to and from a bed to a chair (including a wheelchair)?: None Help needed standing up from a chair using your arms (e.g., wheelchair or bedside chair)?: None Help needed to walk in hospital room?: A Little Help needed climbing 3-5 steps with a railing? : A Little 6 Click Score: 22    End of Session Equipment Utilized During Treatment: Gait belt Activity Tolerance: Patient tolerated treatment well Patient left: in chair;with call bell/phone within reach;with chair alarm set;Other (comment)(Polar care donned to L knee) Nurse Communication: Mobility status;Other (comment)(SCD's not working) PT Visit Diagnosis: Other abnormalities of gait and mobility (R26.89);Muscle weakness (generalized) (M62.81)     Time: [id number] PT Time Calculation (min) (ACUTE ONLY): 26 min  Charges:  $Gait Training: 8-22 mins $Therapeutic Exercise: 8-22 mins                     D. Scott Izacc Demeyer PT, DPT 01/08/20, 11:21 AM

## 2020-01-08 NOTE — Progress Notes (Signed)
  Subjective: 2 Days Post-Op Procedure(s) (LRB): COMPUTER ASSISTED TOTAL KNEE ARTHROPLASTY (Left) Patient reports pain as minimal.   Patient is well, and has had no acute complaints or problems Plan is to go Home after hospital stay. Negative for chest pain and shortness of breath Fever: no Gastrointestinal: negative for nausea and vomiting.   Patient has had a bowel movement.  Objective: Vital signs in last 24 hours: Temp:  [98.1 F (36.7 C)-98.6 F (37 C)] 98.6 F (37 C) (04/23 0800) Pulse Rate:  [54-71] 71 (04/23 0800) Resp:  [16-20] 16 (04/23 0800) BP: (114-144)/(32-51) 144/51 (04/23 0800) SpO2:  [96 %-100 %] 96 % (04/23 0800)  Intake/Output from previous day:  Intake/Output Summary (Last 24 hours) at 01/08/2020 0803 Last data filed at 01/08/2020 0600 Gross per 24 hour  Intake 1326.82 ml  Output 100 ml  Net 1226.82 ml    Intake/Output this shift: No intake/output data recorded.  Labs: No results for input(s): HGB in the last 72 hours. No results for input(s): WBC, RBC, HCT, PLT in the last 72 hours. No results for input(s): NA, K, CL, CO2, BUN, CREATININE, GLUCOSE, CALCIUM in the last 72 hours. No results for input(s): LABPT, INR in the last 72 hours.   EXAM General - Patient is Alert, Appropriate and Oriented Extremity - Neurovascular intact Dorsiflexion/Plantar flexion intact Compartment soft Dressing/Incision -clean, dry, no drainage, Hemovac in place. Polar Care in place but not connected to the cold pad. Motor Function - intact, moving foot and toes well on exam. Able to actively extend hallux. Cardiovascular- Regular rate and rhythm, no murmurs/rubs/gallops Respiratory- Lungs clear to auscultation bilaterally Gastrointestinal- soft and nontender   Assessment/Plan: 2 Days Post-Op Procedure(s) (LRB): COMPUTER ASSISTED TOTAL KNEE ARTHROPLASTY (Left) Active Problems:   Total knee replacement status  Estimated body mass index is 21.95 kg/m as calculated  from the following:   Height as of this encounter: 5\' 3"  (1.6 m).   Weight as of this encounter: 56.2 kg. Advance diet Up with therapy Discharge home with home health    DVT Prophylaxis - Lovenox, Ted hose and foot pumps Weight-Bearing as tolerated to left leg  Cassell Smiles, PA-C [facility] Orthopaedic Surgery 01/08/2020, 8:03 AM

## 2020-01-08 NOTE — Care Management Important Message (Signed)
Important Message  Patient Details  Name: Tracey [name] MRN: [id number] Date of Birth: 11/08/1935   Medicare Important Message Given:  N/A - LOS <3 / Initial given by admissions     Juliann Pulse A [name] 01/08/2020, 8:22 AM

## 2020-01-08 NOTE — Progress Notes (Signed)
Discharge instructions reviewed with patient. She verbalized understanding. IV removed. TED hose applied to LLE. Patient given 3 honeycomb dressings for home. Vitals stable. She is not waiting to get dressed and to be wheeled out.

## 2020-01-08 NOTE — Discharge Summary (Signed)
Physician Discharge Summary  Patient ID: Tracey [name] MRN: [id number] DOB/AGE: [birth year] 84 y.o.  Admit date: 01/06/2020 Discharge date: 01/08/2020  Admission Diagnoses:  Total knee replacement status [Z96.659]  Surgeries:Procedure(s): PROCEDURE:  Left total knee arthroplasty using computer-assisted navigation  SURGEON:  Marciano Sequin. M.D.  ASSISTANT: Cassell Smiles, PA-C (present and scrubbed throughout the case, critical for assistance with exposure, retraction, instrumentation, and closure)  ANESTHESIA: general  ESTIMATED BLOOD LOSS: 50 mL  FLUIDS REPLACED: 700 mL of crystalloid  TOURNIQUET TIME: 98 minutes  DRAINS: 2 medium Hemovac drains  SOFT TISSUE RELEASES: Anterior cruciate ligament, posterior cruciate ligament, deep medial collateral ligament, patellofemoral ligament, and posterolateral corner  IMPLANTS UTILIZED: DePuy PFC Sigma size 2.5 posterior stabilized femoral component (cemented), size 2.5 MBT tibial component (cemented), 35 mm 3 peg oval dome patella (cemented), and a 15 mm stabilized rotating platform polyethylene insert.  Discharge Diagnoses: Patient Active Problem List   Diagnosis Date Noted  . Total knee replacement status 01/06/2020  . Rectocele 09/27/2019  . Vaginal atrophy 09/27/2019  . UTI symptoms 09/27/2019  . Renal artery stenosis ([place]) 06/30/2019  . Diarrhea 06/16/2019  . Varicose veins of both lower extremities with inflammation 05/27/2019  . Essential hypertension, benign 05/07/2019  . Coronary artery disease of native artery of native heart with stable angina pectoris ([place]) 04/01/2019  . Medicare annual wellness visit, subsequent 04/01/2019  . Syncope 12/25/2018  . Palpitations 12/25/2018  . PAD (peripheral artery disease) ([place]) 12/25/2018  . Stenosis of left carotid artery 12/25/2018  . Abnormal urinalysis 10/15/2018  . Primary osteoarthritis of left knee 04/27/2018  . Weight loss 01/13/2018  . PAF (paroxysmal atrial  fibrillation) ([place]) 11/11/2017  . Routine general medical examination at a health care facility 11/11/2017  . Renal insufficiency 11/11/2017  . Osteopenia 04/30/2017  . Anemia 02/12/2017  . Vitamin D deficiency 02/03/2017  . Well woman exam 11/05/2016  . Vitamin B12 deficiency 11/05/2016  . Change in bowel habits 03/07/2015  . FH: colon cancer 03/07/2015  . GERD (gastroesophageal reflux disease) 03/07/2015  . Osteoarthritis 03/15/2014  . Hypertension 01/22/2012  . CAD (coronary artery disease) 01/22/2012  . Status post aorto-coronary artery bypass graft 01/22/2012  . Mixed hyperlipidemia 01/22/2012  . Type 2 diabetes mellitus with stage 2 chronic kidney disease, without long-term current use of insulin ([place]) 01/22/2012  . Atherosclerosis of renal artery ([place]) 01/22/2012    Past Medical History:  Diagnosis Date  . Anxiety   . Arthritis   . Atrial fibrillation ([place])    history  . Chronic kidney disease    ckd stage II  . Coronary artery disease   . Diabetes mellitus    non insulin dependent  . Dysrhythmia    atrial fibrillation, history of  . GERD (gastroesophageal reflux disease)   . Heart murmur   . History of kidney stones   . Hyperlipidemia   . Hypertension   . Peripheral vascular disease ([place])   . Rectocele   . Syncope 09/2019  . UTI (urinary tract infection)      Transfusion:    Consultants (if any):   Discharged Condition: Improved  Hospital Course: Tracey [name] is an 84 y.o. female who was admitted 01/06/2020 with a diagnosis of left knee osteoarthritis and went to the operating room on 01/06/2020 and underwent left total knee arthroplasty . The patient received perioperative antibiotics for prophylaxis (see below). The patient tolerated the procedure well and was transported to PACU in stable condition. After meeting PACU  criteria, the patient was subsequently transferred to the Orthopaedics/Rehabilitation unit.   The patient received DVT prophylaxis in the  form of early mobilization, Lovenox, Foot Pumps and TED hose. A sacral pad had been placed and heels were elevated off of the bed with rolled towels in order to protect skin integrity. Foley catheter was discontinued on postoperative day #0. Wound drains were discontinued on postoperative day #2. The surgical incision was healing well without signs of infection.  Physical therapy was initiated postoperatively for transfers, gait training, and strengthening. Occupational therapy was initiated for activities of daily living and evaluation for assisted devices. Rehabilitation goals were reviewed in detail with the patient. The patient made steady progress with physical therapy and physical therapy recommended discharge to Home.   The patient achieved his preliminary goals of this hospitalization and was felt to be medically and orthopaedically appropriate for discharge.  She was given perioperative antibiotics:  Anti-infectives (From admission, onward)   Start     Dose/Rate Route Frequency Ordered Stop   01/06/20 2000  ceFAZolin (ANCEF) IVPB 2g/100 mL premix     2 g 200 mL/hr over 30 Minutes Intravenous Every 6 hours 01/06/20 1841 01/07/20 1505   01/06/20 1115  ceFAZolin (ANCEF) IVPB 2g/100 mL premix     2 g 200 mL/hr over 30 Minutes Intravenous On call to O.R. 01/06/20 1103 01/06/20 1355    .  Recent vital signs:  Vitals:   01/08/20 0548 01/08/20 0800  BP: (!) 126/44 (!) 144/51  Pulse: 63 71  Resp:  16  Temp:  98.6 F (37 C)  SpO2:  96%    Recent laboratory studies:  No results for input(s): WBC, HGB, HCT, PLT, K, CL, CO2, BUN, CREATININE, GLUCOSE, CALCIUM, LABPT, INR in the last 72 hours.  Diagnostic Studies: DG Knee Left Port  Result Date: 01/06/2020 CLINICAL DATA:  Postop EXAM: PORTABLE LEFT KNEE - 1-2 VIEW COMPARISON:  None FINDINGS: Status post left knee replacement with intact hardware and normal alignment. Suprapatellar drain. Gas in the soft tissues consistent with recent  surgery. IMPRESSION: Status post left knee replacement with expected postsurgical changes. Electronically Signed   By: Donavan Foil M.D.   On: 01/06/2020 17:51    Discharge Medications:   Allergies as of 01/08/2020      Reactions   Codeine Nausea Only   Sick to her stomach   Erythromycin Nausea And Vomiting   Tramadol Nausea Only, Other (See Comments)   Loss of appetite and did not eat   Ciprofloxacin Diarrhea   Upsets stomach      Medication List    STOP taking these medications   aspirin 81 MG tablet     TAKE these medications   acetaminophen 500 MG tablet Commonly known as: TYLENOL Take 500 mg by mouth in the morning and at bedtime.   Align 4 MG Caps Take 1 capsule by mouth daily.   carvedilol 3.125 MG tablet Commonly known as: COREG Take 1 tablet (3.125 mg total) by mouth 2 (two) times daily.   celecoxib 200 MG capsule Commonly known as: CELEBREX Take 1 capsule (200 mg total) by mouth 2 (two) times daily.   clopidogrel 75 MG tablet Commonly known as: PLAVIX Take 1 tablet (75 mg total) by mouth daily.   CRANBERRY PO Take 1 tablet by mouth daily.   enoxaparin 40 MG/0.4ML injection Commonly known as: LOVENOX Inject 0.4 mLs (40 mg total) into the skin daily for 14 days.   glipiZIDE 2.5 MG 24 hr  tablet Commonly known as: GLUCOTROL XL TAKE 1 TABLET BY MOUTH EVERY DAY WITH BREAKFAST   lisinopril 10 MG tablet Commonly known as: ZESTRIL Take 1 tablet (10 mg total) by mouth daily. What changed: when to take this   metFORMIN 500 MG 24 hr tablet Commonly known as: GLUCOPHAGE-XR Take 1 tablet (500 mg total) by mouth daily after breakfast.   multivitamin with minerals tablet Take 1 tablet by mouth daily.   nitroGLYCERIN 0.4 MG SL tablet Commonly known as: NITROSTAT Place 1 tablet (0.4 mg total) under the tongue every 5 (five) minutes as needed for chest pain.   OneTouch Delica Lancets [id number] Misc USE TO TEST TWICE A DAY   OneTouch Ultra test strip Generic  drug: glucose blood USE AS DIRECTED TO CHECK BLOOD SUGAR TWICE DAILY AND AS NEEDED   pantoprazole 40 MG tablet Commonly known as: PROTONIX TAKE 1 TABLET BY MOUTH EVERY DAY What changed: how much to take   rosuvastatin 40 MG tablet Commonly known as: CRESTOR Take 1 tablet (40 mg total) by mouth daily. What changed: when to take this   traMADol 50 MG tablet Commonly known as: ULTRAM Take 1 tablet (50 mg total) by mouth every 4 (four) hours as needed for moderate pain.   VITAMIN B-12 IJ Inject as directed See admin instructions. Every two months            Durable Medical Equipment  (From admission, onward)         Start     Ordered   01/06/20 1842  DME Walker rolling  Once    Question:  Patient needs a walker to treat with the following condition  Answer:  Total knee replacement status   01/06/20 1841   01/06/20 1842  DME Bedside commode  Once    Question:  Patient needs a bedside commode to treat with the following condition  Answer:  Total knee replacement status   01/06/20 1841          Disposition: home with home health PT    Follow-up Information    [facility] On 01/21/2020.   Specialty: Orthopedic Surgery Why: at 1:15pm Contact information: [place] [address] [phone]        Dereck Leep, MD On 02/18/2020.   Specialty: Orthopedic Surgery Why: at 2:30pm Contact information: [place] [address] [place], PA-C 01/08/2020, 8:07 AM

## 2020-01-09 DIAGNOSIS — I10 Essential (primary) hypertension: Secondary | ICD-10-CM | POA: Diagnosis not present

## 2020-01-09 DIAGNOSIS — E782 Mixed hyperlipidemia: Secondary | ICD-10-CM | POA: Diagnosis not present

## 2020-01-09 DIAGNOSIS — I480 Paroxysmal atrial fibrillation: Secondary | ICD-10-CM | POA: Diagnosis not present

## 2020-01-09 DIAGNOSIS — Z471 Aftercare following joint replacement surgery: Secondary | ICD-10-CM | POA: Diagnosis not present

## 2020-01-09 DIAGNOSIS — N182 Chronic kidney disease, stage 2 (mild): Secondary | ICD-10-CM | POA: Diagnosis not present

## 2020-01-09 DIAGNOSIS — E1122 Type 2 diabetes mellitus with diabetic chronic kidney disease: Secondary | ICD-10-CM | POA: Diagnosis not present

## 2020-01-09 DIAGNOSIS — E1151 Type 2 diabetes mellitus with diabetic peripheral angiopathy without gangrene: Secondary | ICD-10-CM | POA: Diagnosis not present

## 2020-01-09 DIAGNOSIS — Z96652 Presence of left artificial knee joint: Secondary | ICD-10-CM | POA: Diagnosis not present

## 2020-01-09 DIAGNOSIS — I25118 Atherosclerotic heart disease of native coronary artery with other forms of angina pectoris: Secondary | ICD-10-CM | POA: Diagnosis not present

## 2020-01-09 DIAGNOSIS — K219 Gastro-esophageal reflux disease without esophagitis: Secondary | ICD-10-CM | POA: Diagnosis not present

## 2020-01-11 DIAGNOSIS — K219 Gastro-esophageal reflux disease without esophagitis: Secondary | ICD-10-CM | POA: Diagnosis not present

## 2020-01-11 DIAGNOSIS — Z471 Aftercare following joint replacement surgery: Secondary | ICD-10-CM | POA: Diagnosis not present

## 2020-01-11 DIAGNOSIS — I10 Essential (primary) hypertension: Secondary | ICD-10-CM | POA: Diagnosis not present

## 2020-01-11 DIAGNOSIS — E1122 Type 2 diabetes mellitus with diabetic chronic kidney disease: Secondary | ICD-10-CM | POA: Diagnosis not present

## 2020-01-11 DIAGNOSIS — I25118 Atherosclerotic heart disease of native coronary artery with other forms of angina pectoris: Secondary | ICD-10-CM | POA: Diagnosis not present

## 2020-01-11 DIAGNOSIS — E1151 Type 2 diabetes mellitus with diabetic peripheral angiopathy without gangrene: Secondary | ICD-10-CM | POA: Diagnosis not present

## 2020-01-11 DIAGNOSIS — I480 Paroxysmal atrial fibrillation: Secondary | ICD-10-CM | POA: Diagnosis not present

## 2020-01-11 DIAGNOSIS — Z96652 Presence of left artificial knee joint: Secondary | ICD-10-CM | POA: Diagnosis not present

## 2020-01-11 DIAGNOSIS — N182 Chronic kidney disease, stage 2 (mild): Secondary | ICD-10-CM | POA: Diagnosis not present

## 2020-01-11 DIAGNOSIS — E782 Mixed hyperlipidemia: Secondary | ICD-10-CM | POA: Diagnosis not present

## 2020-01-13 DIAGNOSIS — N182 Chronic kidney disease, stage 2 (mild): Secondary | ICD-10-CM | POA: Diagnosis not present

## 2020-01-13 DIAGNOSIS — E782 Mixed hyperlipidemia: Secondary | ICD-10-CM | POA: Diagnosis not present

## 2020-01-13 DIAGNOSIS — Z471 Aftercare following joint replacement surgery: Secondary | ICD-10-CM | POA: Diagnosis not present

## 2020-01-13 DIAGNOSIS — E1151 Type 2 diabetes mellitus with diabetic peripheral angiopathy without gangrene: Secondary | ICD-10-CM | POA: Diagnosis not present

## 2020-01-13 DIAGNOSIS — Z96652 Presence of left artificial knee joint: Secondary | ICD-10-CM | POA: Diagnosis not present

## 2020-01-13 DIAGNOSIS — I10 Essential (primary) hypertension: Secondary | ICD-10-CM | POA: Diagnosis not present

## 2020-01-13 DIAGNOSIS — K219 Gastro-esophageal reflux disease without esophagitis: Secondary | ICD-10-CM | POA: Diagnosis not present

## 2020-01-13 DIAGNOSIS — E1122 Type 2 diabetes mellitus with diabetic chronic kidney disease: Secondary | ICD-10-CM | POA: Diagnosis not present

## 2020-01-13 DIAGNOSIS — I480 Paroxysmal atrial fibrillation: Secondary | ICD-10-CM | POA: Diagnosis not present

## 2020-01-13 DIAGNOSIS — I25118 Atherosclerotic heart disease of native coronary artery with other forms of angina pectoris: Secondary | ICD-10-CM | POA: Diagnosis not present

## 2020-01-15 DIAGNOSIS — E1122 Type 2 diabetes mellitus with diabetic chronic kidney disease: Secondary | ICD-10-CM | POA: Diagnosis not present

## 2020-01-15 DIAGNOSIS — I25118 Atherosclerotic heart disease of native coronary artery with other forms of angina pectoris: Secondary | ICD-10-CM | POA: Diagnosis not present

## 2020-01-15 DIAGNOSIS — Z96652 Presence of left artificial knee joint: Secondary | ICD-10-CM | POA: Diagnosis not present

## 2020-01-15 DIAGNOSIS — I10 Essential (primary) hypertension: Secondary | ICD-10-CM | POA: Diagnosis not present

## 2020-01-15 DIAGNOSIS — E1151 Type 2 diabetes mellitus with diabetic peripheral angiopathy without gangrene: Secondary | ICD-10-CM | POA: Diagnosis not present

## 2020-01-15 DIAGNOSIS — Z471 Aftercare following joint replacement surgery: Secondary | ICD-10-CM | POA: Diagnosis not present

## 2020-01-15 DIAGNOSIS — K219 Gastro-esophageal reflux disease without esophagitis: Secondary | ICD-10-CM | POA: Diagnosis not present

## 2020-01-15 DIAGNOSIS — E782 Mixed hyperlipidemia: Secondary | ICD-10-CM | POA: Diagnosis not present

## 2020-01-15 DIAGNOSIS — I480 Paroxysmal atrial fibrillation: Secondary | ICD-10-CM | POA: Diagnosis not present

## 2020-01-15 DIAGNOSIS — N182 Chronic kidney disease, stage 2 (mild): Secondary | ICD-10-CM | POA: Diagnosis not present

## 2020-01-17 IMAGING — CT CT CTA ABD/PEL W/CM AND/OR W/O CM
2 of 7 series · 14 of 46 positions shown, 16 images · IV contrast (omnipaque)
Comparison: Report from a renal duplex ultrasound on 05/27/2019

CLINICAL DATA: History of prior placement of left renal artery
stent. Recent renal duplex ultrasound demonstrated aortic stenosis
with elevated velocities in the proximal to mid abdominal aorta.

EXAM:
CT ANGIOGRAPHY ABDOMEN AND PELVIS WITH CONTRAST AND WITHOUT CONTRAST
TECHNIQUE: Multidetector CT imaging of the abdomen and pelvis was performed
using the standard protocol during bolus administration of
intravenous contrast. Multiplanar reconstructed images and MIPs were
obtained and reviewed to evaluate the vascular anatomy.
CONTRAST:  80mL OMNIPAQUE IOHEXOL 350 MG/ML SOLN

[Series 5: arterial · axial · arterial · 0.71mm/px · z∈[-680,-300]mm · 11 of 214 slices shown, 13 images]
[im 12/214  soft-tissue]
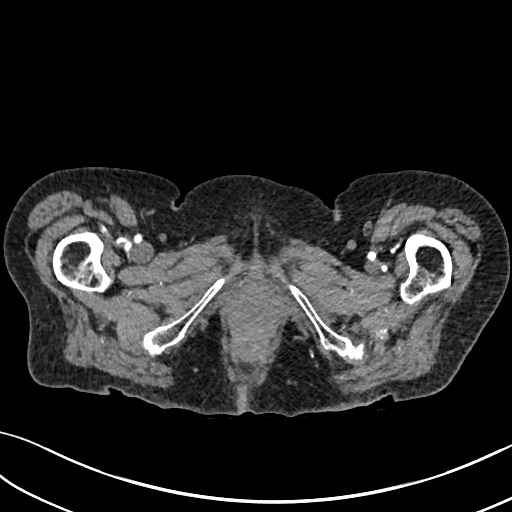
[im 12/214  bone]
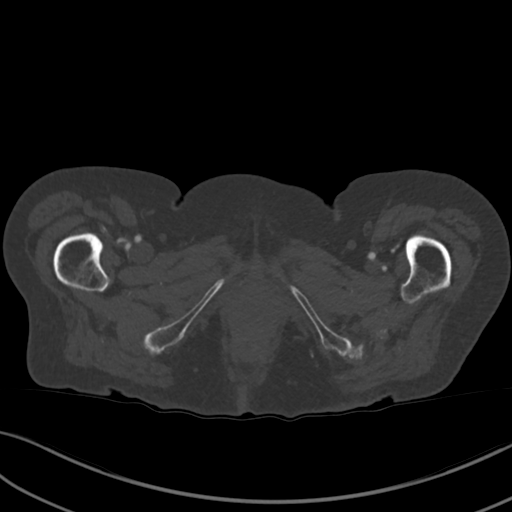
[im 34/214  soft-tissue]
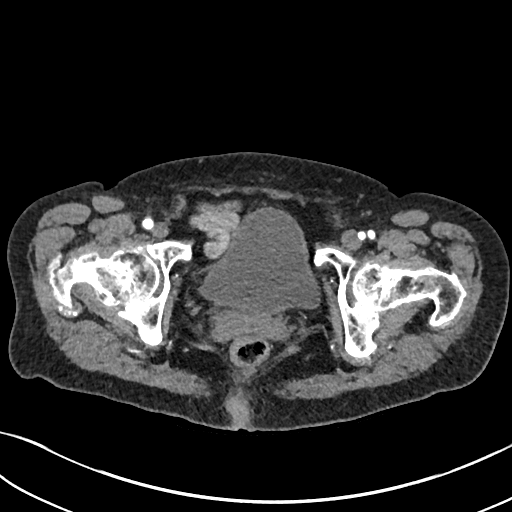
[im 57/214  soft-tissue]
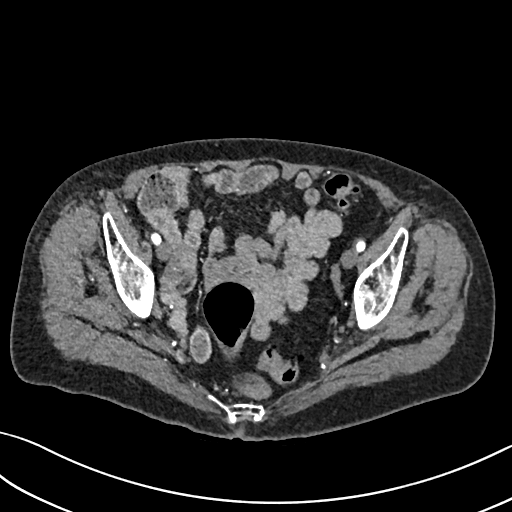
[im 68/214  soft-tissue]
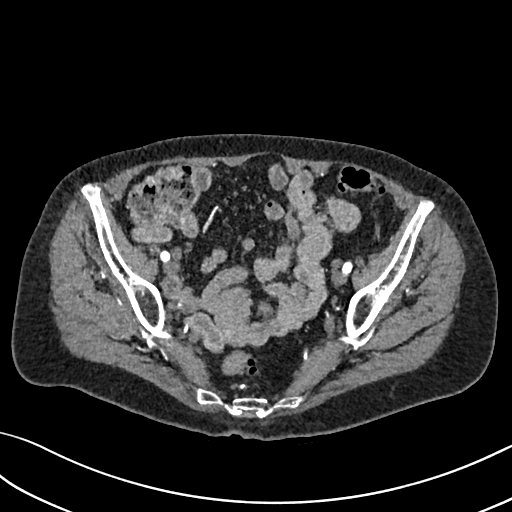
[im 90/214  soft-tissue]
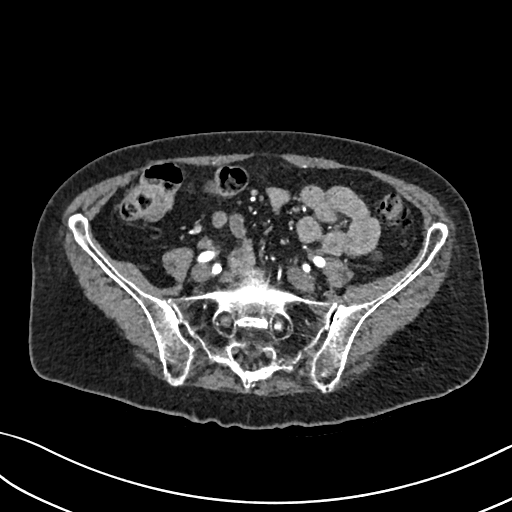
[im 113/214  soft-tissue]
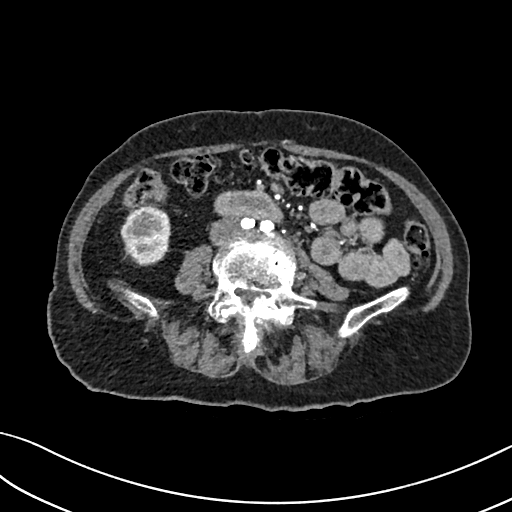
[im 124/214  soft-tissue]
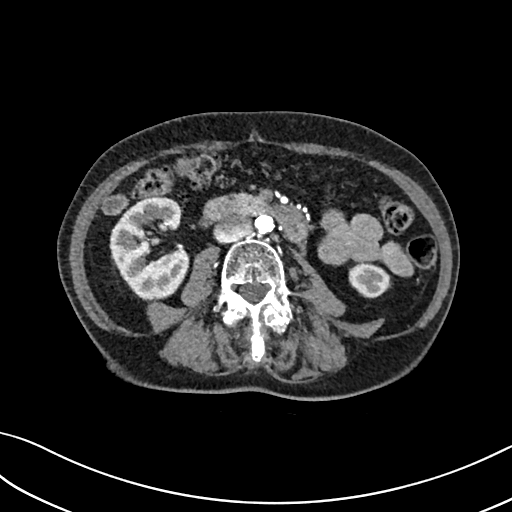
[im 146/214  soft-tissue]
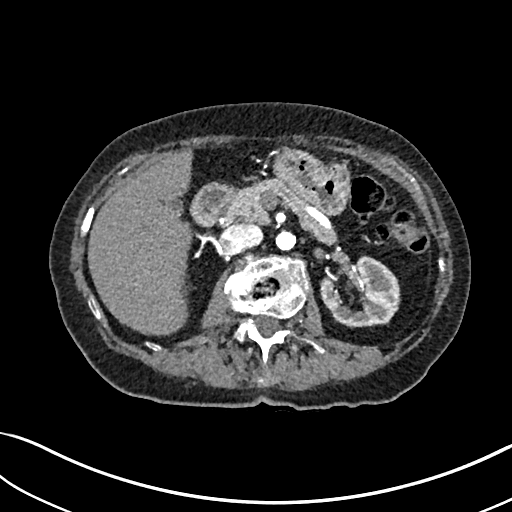
[im 157/214  soft-tissue]
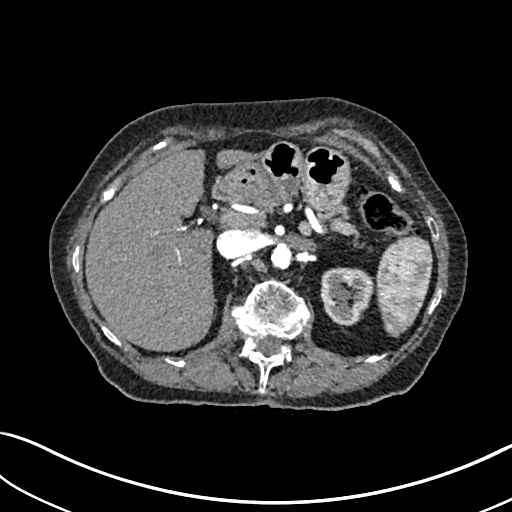
[im 157/214  bone]
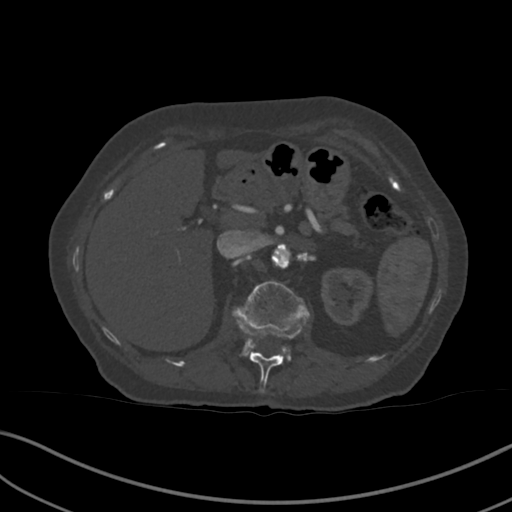
[im 180/214  soft-tissue]
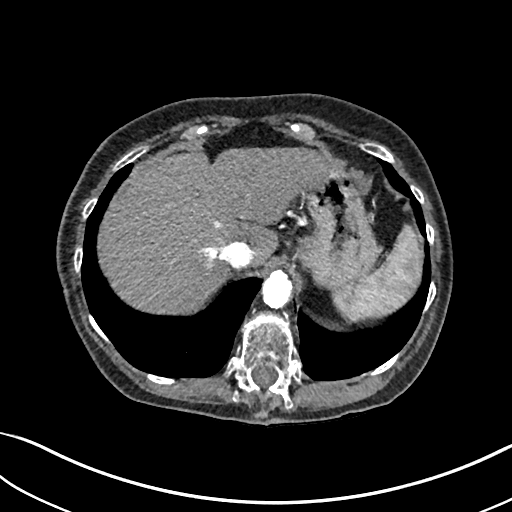
[im 202/214  soft-tissue]
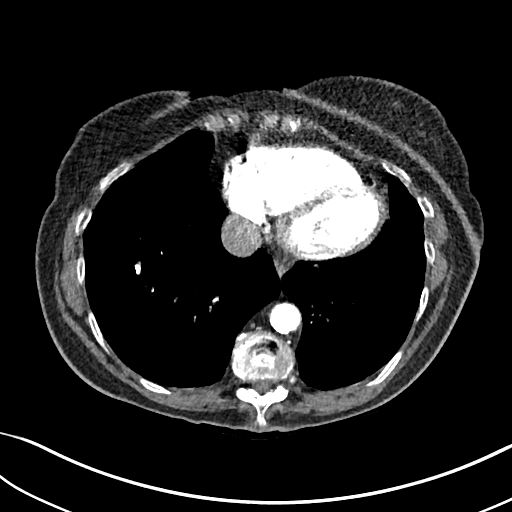

[Series 8: cor arterial · coronal · arterial · 0.72mm/px · 3 of 137 slices shown]
[im 35/137  soft-tissue]
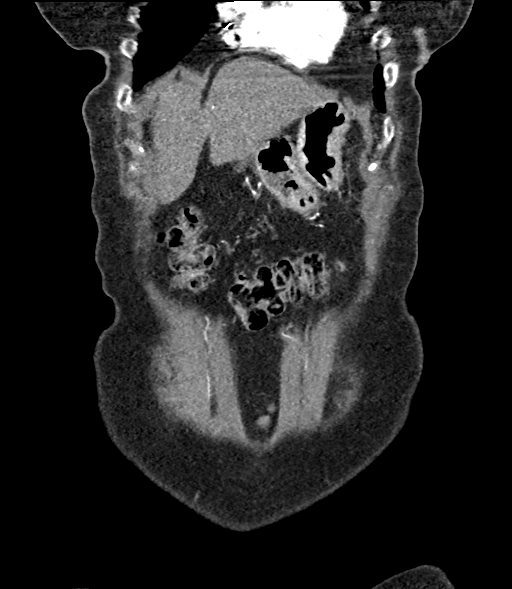
[im 69/137  soft-tissue]
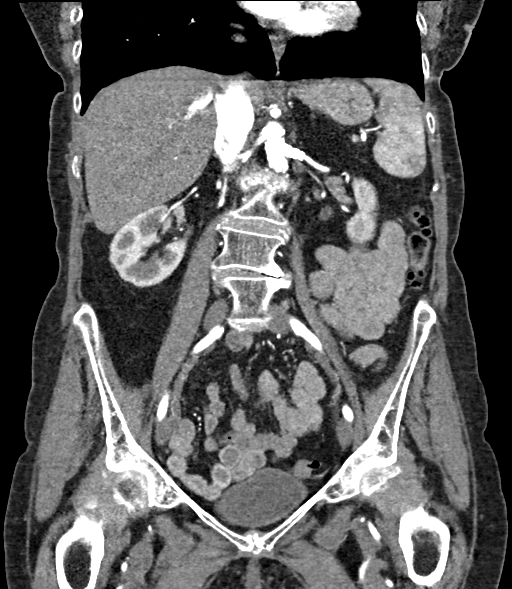
[im 103/137  soft-tissue]
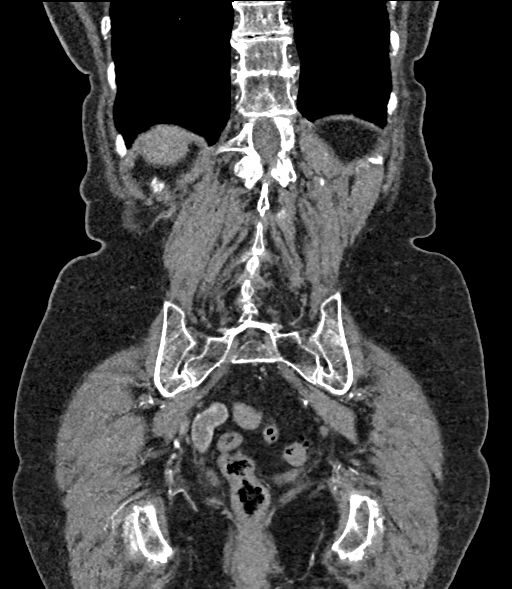

[14 of 46 positions shown; findings below may reference images not displayed]

FINDINGS: VASCULAR

Aorta: The entire abdominal aorta is heavily calcified. There is no
evidence of aneurysmal disease. Beginning at the level of the
previously placed left renal artery stent and continuing just below
the left renal artery origin, heavily calcified eccentric plaque
causes significant irregular luminal stenosis of the aorta of
approximately 80-90%. No component of dissection or penetrating
ulcer disease identified. The calcified plaque is highly irregular
and likely ulcerated.

The rest of the aorta demonstrates no significant stenosis. There is
no evidence of vasculitis.

Celiac: Calcified plaque along the superior margin of the celiac
origin without evidence of significant stenosis. Distal branch
vessels appear normally patent and demonstrate normal branching
anatomy.

SMA: Mild calcified plaque at the origin of the SMA without evidence
of stenosis. Distal branch vessels appear normally patent.

Renals: Single left renal artery demonstrates a proximal
intravascular stent extending from the orifice over a length of
approximately 16 mm. The stent is patent and demonstrates no
internal stenosis. An early branch originating along the inferior
margin of the mid stent remains open and supplies the posterior
upper pole. Other distal branches appear normally patent.

Calcified plaque is present at the origin of a single right renal
artery causing approximately 40-50% stenosis. The right renal artery
demonstrates early bifurcation and normally patent distal branches.

IMA: A small caliber IMA is patent.

Inflow: Bilateral common iliac arteries demonstrate calcified plaque
without significant stenosis. Bilateral external and internal iliac
arteries are normally patent. No iliac aneurysmal disease.

Proximal Outflow: Bilateral common femoral arteries and femoral
bifurcations are normally patent.

Veins: Delayed imaging through the level of the kidneys demonstrates
no venous abnormalities.

Review of the MIP images confirms the above findings.

NON-VASCULAR

Lower chest: No acute abnormality.

Hepatobiliary: No focal liver abnormality is seen. No gallstones,
gallbladder wall thickening, or biliary dilatation.

Pancreas: Unremarkable. No pancreatic ductal dilatation or
surrounding inflammatory changes.

Spleen: Normal in size without focal abnormality.

Adrenals/Urinary Tract: Adrenal glands are unremarkable. Kidneys are
normal, without renal calculi, focal lesion, or hydronephrosis.
Bladder is unremarkable.

Stomach/Bowel: The descending and sigmoid colon demonstrate
diverticulosis. No evidence of diverticulitis. No bowel obstruction
or free air identified.

Lymphatic: No enlarged lymph nodes identified.

Reproductive: Uterus and bilateral adnexa are unremarkable.

Other: No abdominal wall hernia or abnormality. No abdominopelvic
ascites.

Musculoskeletal: The spine demonstrates osteopenia as well as
diffuse degenerative disc disease. There is a mild anterolisthesis
of L4 on L5 of approximately 5 mm. No fractures or bony lesions
identified.
IMPRESSION: VASCULAR

1. Significant focal stenosis of the mid abdominal aorta beginning
at the origin of a left renal artery stent and continuing just below
the inferior stent margin. Stenosis is caused by eccentric and
highly irregular calcified plaque causing roughly 80-90% maximal
narrowing.
2. Patent proximal left renal artery stent. An early branch
supplying the posterior upper pole of the left kidney is patent off
of the inferior aspect of the mid stent.
3. Non critical estimated 40-50% proximal right renal artery
stenosis.

NON-VASCULAR

1. Diverticulosis of the colon without diverticulitis.
2. Osteopenia and diffuse degenerative disc disease of the spine.
Mild anterolisthesis of L4 on L5.

## 2020-01-18 DIAGNOSIS — E1122 Type 2 diabetes mellitus with diabetic chronic kidney disease: Secondary | ICD-10-CM | POA: Diagnosis not present

## 2020-01-18 DIAGNOSIS — N182 Chronic kidney disease, stage 2 (mild): Secondary | ICD-10-CM | POA: Diagnosis not present

## 2020-01-18 DIAGNOSIS — K219 Gastro-esophageal reflux disease without esophagitis: Secondary | ICD-10-CM | POA: Diagnosis not present

## 2020-01-18 DIAGNOSIS — E782 Mixed hyperlipidemia: Secondary | ICD-10-CM | POA: Diagnosis not present

## 2020-01-18 DIAGNOSIS — Z471 Aftercare following joint replacement surgery: Secondary | ICD-10-CM | POA: Diagnosis not present

## 2020-01-18 DIAGNOSIS — I480 Paroxysmal atrial fibrillation: Secondary | ICD-10-CM | POA: Diagnosis not present

## 2020-01-18 DIAGNOSIS — I25118 Atherosclerotic heart disease of native coronary artery with other forms of angina pectoris: Secondary | ICD-10-CM | POA: Diagnosis not present

## 2020-01-18 DIAGNOSIS — Z96652 Presence of left artificial knee joint: Secondary | ICD-10-CM | POA: Diagnosis not present

## 2020-01-18 DIAGNOSIS — I10 Essential (primary) hypertension: Secondary | ICD-10-CM | POA: Diagnosis not present

## 2020-01-18 DIAGNOSIS — E1151 Type 2 diabetes mellitus with diabetic peripheral angiopathy without gangrene: Secondary | ICD-10-CM | POA: Diagnosis not present

## 2020-01-19 ENCOUNTER — Other Ambulatory Visit: Payer: Self-pay

## 2020-01-19 DIAGNOSIS — Z471 Aftercare following joint replacement surgery: Secondary | ICD-10-CM | POA: Diagnosis not present

## 2020-01-19 MED ORDER — NITROGLYCERIN 0.4 MG SL SUBL: 0.4000 mg | SUBLINGUAL_TABLET | SUBLINGUAL | 1 refills | Status: DC | PRN

## 2020-01-20 ENCOUNTER — Other Ambulatory Visit: Payer: Self-pay

## 2020-01-20 DIAGNOSIS — R69 Illness, unspecified: Secondary | ICD-10-CM | POA: Diagnosis not present

## 2020-01-20 DIAGNOSIS — I480 Paroxysmal atrial fibrillation: Secondary | ICD-10-CM | POA: Diagnosis not present

## 2020-01-20 DIAGNOSIS — N182 Chronic kidney disease, stage 2 (mild): Secondary | ICD-10-CM | POA: Diagnosis not present

## 2020-01-20 DIAGNOSIS — E782 Mixed hyperlipidemia: Secondary | ICD-10-CM | POA: Diagnosis not present

## 2020-01-20 DIAGNOSIS — E1122 Type 2 diabetes mellitus with diabetic chronic kidney disease: Secondary | ICD-10-CM | POA: Diagnosis not present

## 2020-01-20 DIAGNOSIS — Z471 Aftercare following joint replacement surgery: Secondary | ICD-10-CM | POA: Diagnosis not present

## 2020-01-20 DIAGNOSIS — E1151 Type 2 diabetes mellitus with diabetic peripheral angiopathy without gangrene: Secondary | ICD-10-CM | POA: Diagnosis not present

## 2020-01-20 DIAGNOSIS — I25118 Atherosclerotic heart disease of native coronary artery with other forms of angina pectoris: Secondary | ICD-10-CM | POA: Diagnosis not present

## 2020-01-20 DIAGNOSIS — K219 Gastro-esophageal reflux disease without esophagitis: Secondary | ICD-10-CM | POA: Diagnosis not present

## 2020-01-20 DIAGNOSIS — I10 Essential (primary) hypertension: Secondary | ICD-10-CM | POA: Diagnosis not present

## 2020-01-20 DIAGNOSIS — Z96652 Presence of left artificial knee joint: Secondary | ICD-10-CM | POA: Diagnosis not present

## 2020-01-21 ENCOUNTER — Ambulatory Visit: Payer: Medicare HMO | Admitting: "Endocrinology

## 2020-01-22 DIAGNOSIS — Z96652 Presence of left artificial knee joint: Secondary | ICD-10-CM | POA: Diagnosis not present

## 2020-01-22 DIAGNOSIS — K219 Gastro-esophageal reflux disease without esophagitis: Secondary | ICD-10-CM | POA: Diagnosis not present

## 2020-01-22 DIAGNOSIS — E782 Mixed hyperlipidemia: Secondary | ICD-10-CM | POA: Diagnosis not present

## 2020-01-22 DIAGNOSIS — E1122 Type 2 diabetes mellitus with diabetic chronic kidney disease: Secondary | ICD-10-CM | POA: Diagnosis not present

## 2020-01-22 DIAGNOSIS — I480 Paroxysmal atrial fibrillation: Secondary | ICD-10-CM | POA: Diagnosis not present

## 2020-01-22 DIAGNOSIS — E1151 Type 2 diabetes mellitus with diabetic peripheral angiopathy without gangrene: Secondary | ICD-10-CM | POA: Diagnosis not present

## 2020-01-22 DIAGNOSIS — N182 Chronic kidney disease, stage 2 (mild): Secondary | ICD-10-CM | POA: Diagnosis not present

## 2020-01-22 DIAGNOSIS — Z471 Aftercare following joint replacement surgery: Secondary | ICD-10-CM | POA: Diagnosis not present

## 2020-01-22 DIAGNOSIS — I25118 Atherosclerotic heart disease of native coronary artery with other forms of angina pectoris: Secondary | ICD-10-CM | POA: Diagnosis not present

## 2020-01-22 DIAGNOSIS — I10 Essential (primary) hypertension: Secondary | ICD-10-CM | POA: Diagnosis not present

## 2020-01-25 DIAGNOSIS — Z96652 Presence of left artificial knee joint: Secondary | ICD-10-CM | POA: Diagnosis not present

## 2020-02-01 DIAGNOSIS — R69 Illness, unspecified: Secondary | ICD-10-CM | POA: Diagnosis not present

## 2020-02-02 DIAGNOSIS — Z96652 Presence of left artificial knee joint: Secondary | ICD-10-CM | POA: Diagnosis not present

## 2020-02-05 ENCOUNTER — Ambulatory Visit (INDEPENDENT_AMBULATORY_CARE_PROVIDER_SITE_OTHER): Payer: Medicare HMO | Admitting: Family Medicine

## 2020-02-05 ENCOUNTER — Other Ambulatory Visit: Payer: Self-pay

## 2020-02-05 ENCOUNTER — Encounter: Payer: Self-pay | Admitting: Family Medicine

## 2020-02-05 VITALS — BP 134/72 | HR 74 | Temp 97.3°F | Ht 63.0 in | Wt 121.0 lb

## 2020-02-05 DIAGNOSIS — R350 Frequency of micturition: Secondary | ICD-10-CM | POA: Insufficient documentation

## 2020-02-05 DIAGNOSIS — R531 Weakness: Secondary | ICD-10-CM | POA: Diagnosis not present

## 2020-02-05 DIAGNOSIS — N289 Disorder of kidney and ureter, unspecified: Secondary | ICD-10-CM

## 2020-02-05 DIAGNOSIS — I10 Essential (primary) hypertension: Secondary | ICD-10-CM | POA: Diagnosis not present

## 2020-02-05 DIAGNOSIS — Z96659 Presence of unspecified artificial knee joint: Secondary | ICD-10-CM | POA: Diagnosis not present

## 2020-02-05 DIAGNOSIS — D649 Anemia, unspecified: Secondary | ICD-10-CM | POA: Diagnosis not present

## 2020-02-05 LAB — POC URINALSYSI DIPSTICK (AUTOMATED)
Blood, UA: NEGATIVE
Glucose, UA: NEGATIVE
Ketones, UA: 5
Leukocytes, UA: NEGATIVE
Nitrite, UA: NEGATIVE
Protein, UA: POSITIVE — AB
Spec Grav, UA: >=1.03 — AB (ref 1.010–1.025)
Urobilinogen, UA: 0.2 E.U./dL
pH, UA: 5.5 (ref 5.0–8.0)

## 2020-02-05 NOTE — Patient Instructions (Signed)
Let's check some labs for blood count/iron stores and also kidney numbers Let's check a urine as well   Drink fluids  Take iron and stool softener   Alert [place] if anything new or if worse

## 2020-02-05 NOTE — Progress Notes (Signed)
Subjective:    Patient ID: Tracey [name], female    DOB: 1936/06/15, 84 y.o.   MRN: [id number]  This visit occurred during the SARS-CoV-2 public health emergency.  Safety protocols were in place, including screening questions prior to the visit, additional usage of staff PPE, and extensive cleaning of exam room while observing appropriate contact time as indicated for disinfecting solutions.    HPI Pt presents with c/o fatigue and wt loss   ? Anemic   Wt Readings from Last 3 Encounters:  02/05/20 121 lb (54.9 kg)  01/06/20 123 lb 14.4 oz (56.2 kg)  12/25/19 123 lb 15.8 oz (56.2 kg)   21.43 kg/m     She had a knee replacement on 5/6 Did well until past several days - developing more generalized weakness and pain at night  Per pt getting weaker and weaker No appetite -forces herself to eat  Cannot sleep due to knee pain  (but overall the rom of knee is almost 100%)  Therapists have done well with her   Her orthopedic doctor started her on iron and ordered her gabapentin for bedtime Added glucerna for addn nutrition   Has slow fe- has not started taking it yet  She had not started taking gabapentin yet   She is drinking fluids _thinks enough    Blood glucose levels were going up in the mornings  140-160 in ams   On celebrex bid  BP Readings from Last 3 Encounters:  02/05/20 134/72  01/08/20 (!) 144/51  11/03/19 124/60   Pulse Readings from Last 3 Encounters:  02/05/20 74  01/08/20 71  11/03/19 (!) 56    Her pre op Hb was 9.6 Chronic anemia is baseline  Unsure what her blood count was after surgery   Lab Results  Component Value Date   FERRITIN 7.8 (L) 02/11/2018   Lab Results  Component Value Date   CREATININE 0.92 12/29/2019   BUN 30 (H) 12/29/2019   NA 140 12/29/2019   K 4.0 12/29/2019   CL 108 12/29/2019   CO2 25 12/29/2019   No cough or fever No chills   No urinary symptoms at all  UResults for SHARLYNN, ALLAWAY (MRN [id number]) as of  02/07/2020 14:15  Ref. Range 02/05/2020 14:35  Bilirubin, UA Unknown Trace  Clarity, UA Unknown Clear  Color, UA Unknown Dark Yellow  Glucose Latest Ref Range: Negative  Negative  Ketones, UA Unknown 5 mg/dL  Leukocytes,UA Latest Ref Range: Negative  Negative  Nitrite, UA Unknown Negative  pH, UA Latest Ref Range: 5.0 - 8.0  5.5  Protein,UA Latest Ref Range: Negative  Positive (A)  Specific Gravity, UA Latest Ref Range: 1.010 - 1.025  >=1.030 (A)  Urobilinogen, UA Latest Ref Range: 0.2 or 1.0 E.U./dL 0.2  RBC, UA Unknown Negative   Strongly enc more fluid intake   Patient Active Problem List   Diagnosis Date Noted  . General weakness 02/05/2020  . Urine frequency 02/05/2020  . Total knee replacement status 01/06/2020  . Rectocele 09/27/2019  . Vaginal atrophy 09/27/2019  . Renal artery stenosis ([place]) 06/30/2019  . Diarrhea 06/16/2019  . Varicose veins of both lower extremities with inflammation 05/27/2019  . Essential hypertension, benign 05/07/2019  . Coronary artery disease of native artery of native heart with stable angina pectoris ([place]) 04/01/2019  . Medicare annual wellness visit, subsequent 04/01/2019  . Syncope 12/25/2018  . Palpitations 12/25/2018  . PAD (peripheral artery disease) ([place]) 12/25/2018  . Stenosis of left  carotid artery 12/25/2018  . Abnormal urinalysis 10/15/2018  . Primary osteoarthritis of left knee 04/27/2018  . Weight loss 01/13/2018  . PAF (paroxysmal atrial fibrillation) ([place]) 11/11/2017  . Routine general medical examination at a health care facility 11/11/2017  . Renal insufficiency 11/11/2017  . Osteopenia 04/30/2017  . Anemia 02/12/2017  . Vitamin D deficiency 02/03/2017  . Well woman exam 11/05/2016  . Vitamin B12 deficiency 11/05/2016  . Change in bowel habits 03/07/2015  . FH: colon cancer 03/07/2015  . GERD (gastroesophageal reflux disease) 03/07/2015  . Osteoarthritis 03/15/2014  . Hypertension 01/22/2012  . CAD (coronary artery  disease) 01/22/2012  . Status post aorto-coronary artery bypass graft 01/22/2012  . Mixed hyperlipidemia 01/22/2012  . Type 2 diabetes mellitus with stage 2 chronic kidney disease, without long-term current use of insulin ([place]) 01/22/2012  . Atherosclerosis of renal artery ([place]) 01/22/2012   Past Medical History:  Diagnosis Date  . Anxiety   . Arthritis   . Atrial fibrillation ([place])    history  . Chronic kidney disease    ckd stage II  . Coronary artery disease   . Diabetes mellitus    non insulin dependent  . Dysrhythmia    atrial fibrillation, history of  . GERD (gastroesophageal reflux disease)   . Heart murmur   . History of kidney stones   . Hyperlipidemia   . Hypertension   . Peripheral vascular disease ([place])   . Rectocele   . Syncope 09/2019  . UTI (urinary tract infection)    Past Surgical History:  Procedure Laterality Date  . ABDOMINAL AORTA STENT    . APPENDECTOMY    . BREAST BIOPSY Left    20 years ago  . BREAST BIOPSY Right 2008  . BREAST EXCISIONAL BIOPSY     rt 2008 lt "20 years ago"  . CARDIAC CATHETERIZATION    . CATARACT EXTRACTION W/PHACO Left 08/15/2015   Procedure: CATARACT EXTRACTION PHACO AND INTRAOCULAR LENS PLACEMENT (IOC);  Surgeon: Estill Cotta, MD;  Location: [facility] ORS;  Service: Ophthalmology;  Laterality: Left;  [place] 01:28 AP% 26.4 CDE 35.73 fluid pack # [id number]  . COLONOSCOPY WITH PROPOFOL N/A 01/16/2016   Procedure: COLONOSCOPY WITH PROPOFOL;  Surgeon: Manya Silvas, MD;  Location: [facility];  Service: Endoscopy;  Laterality: N/A;  . CORONARY ANGIOPLASTY WITH STENT PLACEMENT    . CORONARY ARTERY BYPASS GRAFT  1995   with LIMA  to the LAD, SVG to OM1, SVG to PDA  . ESOPHAGOGASTRODUODENOSCOPY (EGD) WITH PROPOFOL N/A 01/16/2016   Procedure: ESOPHAGOGASTRODUODENOSCOPY (EGD) WITH PROPOFOL;  Surgeon: Manya Silvas, MD;  Location: [facility] ENDOSCOPY;  Service: Endoscopy;  Laterality: N/A;  . EYE SURGERY    . JOINT REPLACEMENT Right     . KNEE ARTHROPLASTY Left 01/06/2020   Procedure: COMPUTER ASSISTED TOTAL KNEE ARTHROPLASTY;  Surgeon: Dereck Leep, MD;  Location: [facility] ORS;  Service: Orthopedics;  Laterality: Left;  . LOWER EXTREMITY ANGIOGRAPHY N/A 07/08/2019   Procedure: Abdominal Aortagraphy;  Surgeon: Algernon Huxley, MD;  Location: [place] [facility];  Service: Cardiovascular;  Laterality: N/A;  . RENAL ANGIOGRAPHY Left 08/26/2019   Procedure: RENAL ANGIOGRAPHY;  Surgeon: Algernon Huxley, MD;  Location: [place] [facility];  Service: Cardiovascular;  Laterality: Left;  . RENAL ARTERY STENT     left, By Dr Hulda Humphrey  . REPLACEMENT TOTAL KNEE  03/25/2013   right  . TONSILLECTOMY     Social History   Tobacco Use  . Smoking status: Never Smoker  .  Smokeless tobacco: Never Used  Substance Use Topics  . Alcohol use: No  . Drug use: No   Family History  Problem Relation Age of Onset  . Heart disease Father   . Other Brother        open heart surgery  . Cancer Brother   . Other Brother        open heart surgery  . Other Brother        open heart surgery  . Hyperlipidemia Daughter   . Breast cancer Neg Hx   . Kidney cancer Neg Hx   . Bladder Cancer Neg Hx    Allergies  Allergen Reactions  . Codeine Nausea Only    Sick to her stomach  . Erythromycin Nausea And Vomiting  . Tramadol Nausea Only and Other (See Comments)    Loss of appetite and did not eat  . Ciprofloxacin Diarrhea    Upsets stomach   Current Outpatient Medications on File Prior to Visit  Medication Sig Dispense Refill  . acetaminophen (TYLENOL) 500 MG tablet Take 500 mg by mouth in the morning and at bedtime.     . carvedilol (COREG) 3.125 MG tablet Take 1 tablet (3.125 mg total) by mouth 2 (two) times daily. 180 tablet 3  . celecoxib (CELEBREX) 200 MG capsule Take 1 capsule (200 mg total) by mouth 2 (two) times daily. 84 capsule 0  . clopidogrel (PLAVIX) 75 MG tablet Take 1 tablet (75 mg total) by mouth daily. 90 tablet 3  . CRANBERRY PO Take  1 tablet by mouth daily.    . Cyanocobalamin (VITAMIN B-12 IJ) Inject as directed See admin instructions. Every two months    . glipiZIDE (GLUCOTROL XL) 2.5 MG 24 hr tablet TAKE 1 TABLET BY MOUTH EVERY DAY WITH BREAKFAST 90 tablet 0  . lisinopril (ZESTRIL) 10 MG tablet Take 1 tablet (10 mg total) by mouth daily. (Patient taking differently: Take 10 mg by mouth at bedtime. ) 90 tablet 3  . metFORMIN (GLUCOPHAGE-XR) 500 MG 24 hr tablet Take 1 tablet (500 mg total) by mouth daily after breakfast. 90 tablet 1  . Multiple Vitamins-Minerals (MULTIVITAMIN WITH MINERALS) tablet Take 1 tablet by mouth daily.    . nitroGLYCERIN (NITROSTAT) 0.4 MG SL tablet Place 1 tablet (0.4 mg total) under the tongue every 5 (five) minutes as needed for chest pain. 25 tablet 1  . OneTouch Delica Lancets 99991111 MISC USE TO TEST TWICE A DAY 100 each 1  . ONETOUCH ULTRA test strip USE AS DIRECTED TO CHECK BLOOD SUGAR TWICE DAILY AND AS NEEDED 100 strip 2  . pantoprazole (PROTONIX) 40 MG tablet TAKE 1 TABLET BY MOUTH EVERY DAY (Patient taking differently: Take 20 mg by mouth daily. ) 90 tablet 1  . Probiotic Product (ALIGN) 4 MG CAPS Take 1 capsule by mouth daily.    . rosuvastatin (CRESTOR) 40 MG tablet Take 1 tablet (40 mg total) by mouth daily. (Patient taking differently: Take 40 mg by mouth at bedtime. ) 90 tablet 3  . traMADol (ULTRAM) 50 MG tablet Take 1 tablet (50 mg total) by mouth every 4 (four) hours as needed for moderate pain. 30 tablet 0   No current facility-administered medications on file prior to visit.    Review of Systems  Constitutional: Positive for fatigue. Negative for activity change, appetite change, fever and unexpected weight change.       Generalized weakness  HENT: Negative for congestion, ear pain, rhinorrhea, sinus pressure and sore throat.  Eyes: Negative for pain, redness and visual disturbance.  Respiratory: Negative for cough, shortness of breath and wheezing.   Cardiovascular: Negative  for chest pain and palpitations.  Gastrointestinal: Negative for abdominal pain, blood in stool, constipation and diarrhea.  Endocrine: Negative for polydipsia and polyuria.  Genitourinary: Negative for dysuria, flank pain, frequency, hematuria and urgency.  Musculoskeletal: Positive for arthralgias. Negative for back pain and myalgias.       Knee pain continues to improve along with rom  Skin: Negative for pallor and rash.  Allergic/Immunologic: Negative for environmental allergies.  Neurological: Negative for dizziness, syncope and headaches.  Hematological: Negative for adenopathy. Does not bruise/bleed easily.  Psychiatric/Behavioral: Negative for decreased concentration and dysphoric mood. The patient is not nervous/anxious.        Objective:   Physical Exam Constitutional:      General: She is not in acute distress.    Appearance: Normal appearance. She is well-developed and normal weight.  HENT:     Head: Normocephalic and atraumatic.  Eyes:     Conjunctiva/sclera: Conjunctivae normal.     Pupils: Pupils are equal, round, and reactive to light.  Neck:     Thyroid: No thyromegaly.     Vascular: No carotid bruit or JVD.  Cardiovascular:     Rate and Rhythm: Normal rate and regular rhythm.     Heart sounds: Normal heart sounds. No gallop.   Pulmonary:     Effort: Pulmonary effort is normal. No respiratory distress.     Breath sounds: Normal breath sounds. No wheezing or rales.  Abdominal:     General: Bowel sounds are normal. There is no distension or abdominal bruit.     Palpations: Abdomen is soft. There is no mass.     Tenderness: There is no abdominal tenderness.  Musculoskeletal:     Cervical back: Normal range of motion and neck supple.     Right lower leg: No edema.     Left lower leg: No edema.     Comments: Scar on R knee looks to be well healed  Good rom of that knee  Lymphadenopathy:     Cervical: No cervical adenopathy.  Skin:    General: Skin is warm and  dry.     Coloration: Skin is not pale.     Findings: No rash.  Neurological:     Mental Status: She is alert.     Sensory: No sensory deficit.     Coordination: Coordination normal.     Gait: Gait normal.     Deep Tendon Reflexes: Reflexes are normal and symmetric. Reflexes normal.     Comments: No focal weakness  Psychiatric:        Mood and Affect: Mood normal.           Assessment & Plan:   Problem List Items Addressed This Visit      Cardiovascular and Mediastinum   Essential hypertension, benign    bp in fair control at this time  BP Readings from Last 1 Encounters:  02/05/20 134/72   No changes needed Most recent labs reviewed  Disc lifstyle change with low sodium diet and exercise          Genitourinary   Renal insufficiency    Labs today  Enc better fluid intake  Is more fatigued in the past 2 wk       Relevant Orders   Comprehensive metabolic panel (Completed)   POCT Urinalysis Dipstick (Automated) (Completed)     Other  Anemia    Acute on chronic  S/o knee repl suspect will be worse To start slow fe  Cbc and ferritin today      Relevant Orders   CBC with Differential/Platelet (Completed)   Ferritin (Completed)   Total knee replacement status    Has done well overall but now more fatigued  Suspect anemia and poss dehydration  Plans to start the otc slow fe Also gabapentin at night for discomfort if needed       General weakness - Primary    For 2 wk (s/p knee repl) Strongly suspect anemia (baseline hb 9-10 anyway) Lab today Starting slow fe Also inc fluids  Enc to minimize celebrex       Relevant Orders   CBC with Differential/Platelet (Completed)   Comprehensive metabolic panel (Completed)   Urine frequency    UA today is clear but concentrated  Urged strongly to drink more fluids-esp in light of renal insuff  Labs today as well for renal fx      Relevant Orders   POCT Urinalysis Dipstick (Automated) (Completed)

## 2020-02-06 LAB — COMPREHENSIVE METABOLIC PANEL
AG Ratio: 1.9 (calc) (ref 1.0–2.5)
ALT: 6 U/L (ref 6–29)
AST: 15 U/L (ref 10–35)
Albumin: 4.4 g/dL (ref 3.6–5.1)
Alkaline phosphatase (APISO): 101 U/L (ref 37–153)
BUN/Creatinine Ratio: 25 (calc) — ABNORMAL HIGH (ref 6–22)
BUN: 33 mg/dL — ABNORMAL HIGH (ref 7–25)
CO2: 23 mmol/L (ref 20–32)
Calcium: 10.7 mg/dL — ABNORMAL HIGH (ref 8.6–10.4)
Chloride: 107 mmol/L (ref 98–110)
Creat: 1.33 mg/dL — ABNORMAL HIGH (ref 0.60–0.88)
Globulin: 2.3 g/dL (calc) (ref 1.9–3.7)
Glucose, Bld: 138 mg/dL — ABNORMAL HIGH (ref 65–99)
Potassium: 4.7 mmol/L (ref 3.5–5.3)
Sodium: 140 mmol/L (ref 135–146)
Total Bilirubin: 0.4 mg/dL (ref 0.2–1.2)
Total Protein: 6.7 g/dL (ref 6.1–8.1)

## 2020-02-06 LAB — CBC WITH DIFFERENTIAL/PLATELET
Absolute Monocytes: 603 cells/uL (ref 200–950)
Basophils Absolute: 41 cells/uL (ref 0–200)
Basophils Relative: 0.7 %
Eosinophils Absolute: 93 cells/uL (ref 15–500)
Eosinophils Relative: 1.6 %
HCT: 29.5 % — ABNORMAL LOW (ref 35.0–45.0)
Hemoglobin: 9 g/dL — ABNORMAL LOW (ref 11.7–15.5)
Lymphs Abs: 1525 cells/uL (ref 850–3900)
MCH: 23.9 pg — ABNORMAL LOW (ref 27.0–33.0)
MCHC: 30.5 g/dL — ABNORMAL LOW (ref 32.0–36.0)
MCV: 78.2 fL — ABNORMAL LOW (ref 80.0–100.0)
MPV: 9.8 fL (ref 7.5–12.5)
Monocytes Relative: 10.4 %
Neutro Abs: 3538 cells/uL (ref 1500–7800)
Neutrophils Relative %: 61 %
Platelets: 243 10*3/uL (ref 140–400)
RBC: 3.77 10*6/uL — ABNORMAL LOW (ref 3.80–5.10)
RDW: 15.2 % — ABNORMAL HIGH (ref 11.0–15.0)
Total Lymphocyte: 26.3 %
WBC: 5.8 10*3/uL (ref 3.8–10.8)

## 2020-02-06 LAB — FERRITIN: Ferritin: 27 ng/mL (ref 16–288)

## 2020-02-07 NOTE — Assessment & Plan Note (Signed)
Labs today  Enc better fluid intake  Is more fatigued in the past 2 wk

## 2020-02-07 NOTE — Assessment & Plan Note (Signed)
UA today is clear but concentrated  Urged strongly to drink more fluids-esp in light of renal insuff  Labs today as well for renal fx

## 2020-02-07 NOTE — Assessment & Plan Note (Signed)
Acute on chronic  S/o knee repl suspect will be worse To start slow fe  Cbc and ferritin today

## 2020-02-07 NOTE — Assessment & Plan Note (Signed)
bp in fair control at this time  BP Readings from Last 1 Encounters:  02/05/20 134/72   No changes needed Most recent labs reviewed  Disc lifstyle change with low sodium diet and exercise

## 2020-02-07 NOTE — Assessment & Plan Note (Signed)
For 2 wk (s/p knee repl) Strongly suspect anemia (baseline hb 9-10 anyway) Lab today Starting slow fe Also inc fluids  Enc to minimize celebrex

## 2020-02-07 NOTE — Assessment & Plan Note (Signed)
Has done well overall but now more fatigued  Suspect anemia and poss dehydration  Plans to start the otc slow fe Also gabapentin at night for discomfort if needed

## 2020-02-08 DIAGNOSIS — Z794 Long term (current) use of insulin: Secondary | ICD-10-CM | POA: Diagnosis not present

## 2020-02-08 DIAGNOSIS — D3131 Benign neoplasm of right choroid: Secondary | ICD-10-CM | POA: Diagnosis not present

## 2020-02-08 DIAGNOSIS — Z961 Presence of intraocular lens: Secondary | ICD-10-CM | POA: Diagnosis not present

## 2020-02-08 DIAGNOSIS — H26493 Other secondary cataract, bilateral: Secondary | ICD-10-CM | POA: Diagnosis not present

## 2020-02-08 DIAGNOSIS — E119 Type 2 diabetes mellitus without complications: Secondary | ICD-10-CM | POA: Diagnosis not present

## 2020-02-08 DIAGNOSIS — H524 Presbyopia: Secondary | ICD-10-CM | POA: Diagnosis not present

## 2020-02-09 DIAGNOSIS — Z96652 Presence of left artificial knee joint: Secondary | ICD-10-CM | POA: Diagnosis not present

## 2020-02-11 DIAGNOSIS — Z96652 Presence of left artificial knee joint: Secondary | ICD-10-CM | POA: Diagnosis not present

## 2020-02-16 DIAGNOSIS — Z96652 Presence of left artificial knee joint: Secondary | ICD-10-CM | POA: Diagnosis not present

## 2020-02-18 DIAGNOSIS — Z96652 Presence of left artificial knee joint: Secondary | ICD-10-CM | POA: Diagnosis not present

## 2020-02-29 ENCOUNTER — Telehealth: Payer: Self-pay | Admitting: "Endocrinology

## 2020-02-29 DIAGNOSIS — R69 Illness, unspecified: Secondary | ICD-10-CM | POA: Diagnosis not present

## 2020-03-01 ENCOUNTER — Telehealth: Payer: Self-pay | Admitting: *Deleted

## 2020-03-01 DIAGNOSIS — D649 Anemia, unspecified: Secondary | ICD-10-CM

## 2020-03-01 NOTE — Telephone Encounter (Signed)
I ordered PTH and ca and ferritin and cbc  Thanks for the heads up  The lab may need to know that her lab draw includes orders from 2 different providers

## 2020-03-01 NOTE — Telephone Encounter (Signed)
[facility] ordered labs on pt's (ordered through [facility]), pt is going to have them done here. They ordered a micro al. (urine), Cmp, Vit D, Lipid, TSH, and a free T4,   [name] wanted to know if [name] needed to add any other labs to these. Leafy Ro thinks that pt was suppose to f/u on her blood counts because she was so anemic, but she's if you wanted to add anything else like a PTH or anything else if so please put orders in

## 2020-03-01 NOTE — Telephone Encounter (Signed)
[name] notified.

## 2020-03-02 ENCOUNTER — Other Ambulatory Visit: Payer: Self-pay

## 2020-03-02 ENCOUNTER — Telehealth: Payer: Self-pay

## 2020-03-02 NOTE — Telephone Encounter (Signed)
[name] pts granddaughter said pt wanted to find out if Dr [name] would consider filling Gabapentin for pt that Dr Marry Guan had previously prescribed after knee replacement. Pt has difficulty getting thru to Dr Clydell Hakim office. Gabapentin was not on med list and I spoke with pt and she had thrown her bottle away. I called [facility] [address] and spoke with Amber, it is Gabapentin 300 mg cap taking 1 cap po nightly for 15 days filled on 02/04/20 # 15  X 1 refill. Amber said pt does have available refill on Gabapentin and Amber will get ready for pick up. Pt notified and voiced understanding and will pick up med later today. Pt will cb if need further refill; nothing further needed at this time.

## 2020-03-03 ENCOUNTER — Other Ambulatory Visit (INDEPENDENT_AMBULATORY_CARE_PROVIDER_SITE_OTHER): Payer: Medicare HMO

## 2020-03-03 DIAGNOSIS — N182 Chronic kidney disease, stage 2 (mild): Secondary | ICD-10-CM | POA: Diagnosis not present

## 2020-03-03 DIAGNOSIS — D649 Anemia, unspecified: Secondary | ICD-10-CM | POA: Diagnosis not present

## 2020-03-03 DIAGNOSIS — E1122 Type 2 diabetes mellitus with diabetic chronic kidney disease: Secondary | ICD-10-CM | POA: Diagnosis not present

## 2020-03-03 LAB — CBC WITH DIFFERENTIAL/PLATELET
Basophils Absolute: 0 10*3/uL (ref 0.0–0.1)
Basophils Relative: 0.7 % (ref 0.0–3.0)
Eosinophils Absolute: 0.1 10*3/uL (ref 0.0–0.7)
Eosinophils Relative: 3.4 % (ref 0.0–5.0)
HCT: 29.9 % — ABNORMAL LOW (ref 36.0–46.0)
Hemoglobin: 9.6 g/dL — ABNORMAL LOW (ref 12.0–15.0)
Lymphocytes Relative: 22.4 % (ref 12.0–46.0)
Lymphs Abs: 0.9 10*3/uL (ref 0.7–4.0)
MCHC: 32 g/dL (ref 30.0–36.0)
MCV: 79.6 fl (ref 78.0–100.0)
Monocytes Absolute: 0.4 10*3/uL (ref 0.1–1.0)
Monocytes Relative: 8.7 % (ref 3.0–12.0)
Neutro Abs: 2.7 10*3/uL (ref 1.4–7.7)
Neutrophils Relative %: 64.8 % (ref 43.0–77.0)
Platelets: 175 10*3/uL (ref 150.0–400.0)
RBC: 3.75 Mil/uL — ABNORMAL LOW (ref 3.87–5.11)
RDW: 20.6 % — ABNORMAL HIGH (ref 11.5–15.5)
WBC: 4.1 10*3/uL (ref 4.0–10.5)

## 2020-03-03 LAB — FERRITIN: Ferritin: 18 ng/mL (ref 10.0–291.0)

## 2020-03-04 LAB — LIPID PANEL
Cholesterol: 144 mg/dL (ref <200)
HDL: 46 mg/dL — ABNORMAL LOW (ref >=50)
LDL Cholesterol (Calc): 77 mg/dL (calc)
Non-HDL Cholesterol (Calc): 98 mg/dL (calc) (ref <130)
Total CHOL/HDL Ratio: 3.1 (calc) (ref <5.0)
Triglycerides: 127 mg/dL (ref <150)

## 2020-03-04 LAB — COMPLETE METABOLIC PANEL WITH GFR
AG Ratio: 1.8 (calc) (ref 1.0–2.5)
ALT: 10 U/L (ref 6–29)
AST: 14 U/L (ref 10–35)
Albumin: 4.4 g/dL (ref 3.6–5.1)
Alkaline phosphatase (APISO): 73 U/L (ref 37–153)
BUN/Creatinine Ratio: 23 (calc) — ABNORMAL HIGH (ref 6–22)
BUN: 23 mg/dL (ref 7–25)
CO2: 25 mmol/L (ref 20–32)
Calcium: 10.1 mg/dL (ref 8.6–10.4)
Chloride: 107 mmol/L (ref 98–110)
Creat: 0.98 mg/dL — ABNORMAL HIGH (ref 0.60–0.88)
GFR, Est African American: 63 mL/min/{1.73_m2} (ref >=60)
GFR, Est Non African American: 54 mL/min/{1.73_m2} — ABNORMAL LOW (ref >=60)
Globulin: 2.4 g/dL (calc) (ref 1.9–3.7)
Glucose, Bld: 146 mg/dL — ABNORMAL HIGH (ref 65–99)
Potassium: 4.6 mmol/L (ref 3.5–5.3)
Sodium: 141 mmol/L (ref 135–146)
Total Bilirubin: 0.4 mg/dL (ref 0.2–1.2)
Total Protein: 6.8 g/dL (ref 6.1–8.1)

## 2020-03-04 LAB — MICROALBUMIN / CREATININE URINE RATIO
Creatinine, Urine: 68 mg/dL (ref 20–275)
Microalb Creat Ratio: 28 mcg/mg creat (ref <30)
Microalb, Ur: 1.9 mg/dL

## 2020-03-04 LAB — TSH: TSH: 1.66 mIU/L (ref 0.40–4.50)

## 2020-03-04 LAB — T4, FREE: Free T4: 1.4 ng/dL (ref 0.8–1.8)

## 2020-03-04 LAB — PTH, INTACT AND CALCIUM
Calcium: 10.2 mg/dL (ref 8.6–10.4)
PTH: 48 pg/mL (ref 14–64)

## 2020-03-04 LAB — TIQ-MISC

## 2020-03-09 ENCOUNTER — Ambulatory Visit (INDEPENDENT_AMBULATORY_CARE_PROVIDER_SITE_OTHER): Payer: Medicare HMO

## 2020-03-09 ENCOUNTER — Ambulatory Visit (INDEPENDENT_AMBULATORY_CARE_PROVIDER_SITE_OTHER): Payer: Medicare HMO | Admitting: Nurse Practitioner

## 2020-03-09 ENCOUNTER — Other Ambulatory Visit: Payer: Self-pay

## 2020-03-09 ENCOUNTER — Encounter (INDEPENDENT_AMBULATORY_CARE_PROVIDER_SITE_OTHER): Payer: Self-pay | Admitting: Nurse Practitioner

## 2020-03-09 VITALS — BP 146/61 | HR 60 | Resp 16 | Wt 125.8 lb

## 2020-03-09 DIAGNOSIS — I701 Atherosclerosis of renal artery: Secondary | ICD-10-CM

## 2020-03-09 DIAGNOSIS — I10 Essential (primary) hypertension: Secondary | ICD-10-CM

## 2020-03-09 DIAGNOSIS — I739 Peripheral vascular disease, unspecified: Secondary | ICD-10-CM

## 2020-03-09 MED ORDER — GABAPENTIN 300 MG PO CAPS: 300.0000 mg | ORAL_CAPSULE | Freq: Every day | ORAL | 6 refills | Status: DC

## 2020-03-11 ENCOUNTER — Other Ambulatory Visit: Payer: Self-pay

## 2020-03-11 ENCOUNTER — Encounter: Payer: Self-pay | Admitting: "Endocrinology

## 2020-03-11 ENCOUNTER — Ambulatory Visit: Payer: Medicare HMO | Admitting: "Endocrinology

## 2020-03-11 VITALS — BP 133/57 | HR 54 | Ht 63.0 in | Wt 126.6 lb

## 2020-03-11 DIAGNOSIS — E782 Mixed hyperlipidemia: Secondary | ICD-10-CM

## 2020-03-11 DIAGNOSIS — E1122 Type 2 diabetes mellitus with diabetic chronic kidney disease: Secondary | ICD-10-CM | POA: Diagnosis not present

## 2020-03-11 DIAGNOSIS — N182 Chronic kidney disease, stage 2 (mild): Secondary | ICD-10-CM | POA: Diagnosis not present

## 2020-03-11 DIAGNOSIS — I10 Essential (primary) hypertension: Secondary | ICD-10-CM | POA: Diagnosis not present

## 2020-03-11 NOTE — Progress Notes (Signed)
03/11/2020, 11:43 AM  Endocrinology follow-up note   Subjective:    Patient ID: Tracey [name], female    DOB: Apr 07, 1936.  Tracey [name] is being seen  in follow-up after she was seen in consultation for management of currently uncontrolled symptomatic diabetes requested by  Abner Greenspan, MD.   Past Medical History:  Diagnosis Date  . Anxiety   . Arthritis   . Atrial fibrillation ([place])    history  . Chronic kidney disease    ckd stage II  . Coronary artery disease   . Diabetes mellitus    non insulin dependent  . Dysrhythmia    atrial fibrillation, history of  . GERD (gastroesophageal reflux disease)   . Heart murmur   . History of kidney stones   . Hyperlipidemia   . Hypertension   . Peripheral vascular disease ([place])   . Rectocele   . Syncope 09/2019  . UTI (urinary tract infection)     Past Surgical History:  Procedure Laterality Date  . ABDOMINAL AORTA STENT    . APPENDECTOMY    . BREAST BIOPSY Left    20 years ago  . BREAST BIOPSY Right 2008  . BREAST EXCISIONAL BIOPSY     rt 2008 lt "20 years ago"  . CARDIAC CATHETERIZATION    . CATARACT EXTRACTION W/PHACO Left 08/15/2015   Procedure: CATARACT EXTRACTION PHACO AND INTRAOCULAR LENS PLACEMENT (IOC);  Surgeon: Estill Cotta, MD;  Location: [facility] ORS;  Service: Ophthalmology;  Laterality: Left;  [place] 01:28 AP% 26.4 CDE 35.73 fluid pack # [id number]  . COLONOSCOPY WITH PROPOFOL N/A 01/16/2016   Procedure: COLONOSCOPY WITH PROPOFOL;  Surgeon: Manya Silvas, MD;  Location: [facility] ENDOSCOPY;  Service: Endoscopy;  Laterality: N/A;  . CORONARY ANGIOPLASTY WITH STENT PLACEMENT    . CORONARY ARTERY BYPASS GRAFT  1995   with LIMA  to the LAD, SVG to OM1, SVG to PDA  . ESOPHAGOGASTRODUODENOSCOPY (EGD) WITH PROPOFOL N/A 01/16/2016   Procedure: ESOPHAGOGASTRODUODENOSCOPY (EGD) WITH PROPOFOL;  Surgeon: Manya Silvas, MD;  Location:  [facility] ENDOSCOPY;  Service: Endoscopy;  Laterality: N/A;  . EYE SURGERY    . JOINT REPLACEMENT Right   . KNEE ARTHROPLASTY Left 01/06/2020   Procedure: COMPUTER ASSISTED TOTAL KNEE ARTHROPLASTY;  Surgeon: Dereck Leep, MD;  Location: [facility] ORS;  Service: Orthopedics;  Laterality: Left;  . LOWER EXTREMITY ANGIOGRAPHY N/A 07/08/2019   Procedure: Abdominal Aortagraphy;  Surgeon: Algernon Huxley, MD;  Location: [place] CV LAB;  Service: Cardiovascular;  Laterality: N/A;  . RENAL ANGIOGRAPHY Left 08/26/2019   Procedure: RENAL ANGIOGRAPHY;  Surgeon: Algernon Huxley, MD;  Location: [place] CV LAB;  Service: Cardiovascular;  Laterality: Left;  . RENAL ARTERY STENT     left, By Dr Hulda Humphrey  . REPLACEMENT TOTAL KNEE  03/25/2013   right  . TONSILLECTOMY      Social History   Socioeconomic History  . Marital status: Widowed    Spouse name: Not on file  . Number of children: Not on file  . Years of education: Not on file  . Highest education level: Not  on file  Occupational History  . Occupation: school cafeteria    Comment: retired  Tobacco Use  . Smoking status: Never Smoker  . Smokeless tobacco: Never Used  Vaping Use  . Vaping Use: Never used  Substance and Sexual Activity  . Alcohol use: No  . Drug use: No  . Sexual activity: Never  Other Topics Concern  . Not on file  Social History Narrative   Patient has 3 daughters and a significant other. Plans on going home after surgery.   They have a plan to help the patient out once home!!   Social Determinants of Health   Financial Resource Strain:   . Difficulty of Paying Living Expenses:   Food Insecurity:   . Worried About Charity fundraiser in the Last Year:   . Arboriculturist in the Last Year:   Transportation Needs:   . Film/video editor (Medical):   Marland Kitchen Lack of Transportation (Non-Medical):   Physical Activity:   . Days of Exercise per Week:   . Minutes of Exercise per Session:   Stress:   . Feeling of Stress :    Social Connections:   . Frequency of Communication with Friends and Family:   . Frequency of Social Gatherings with Friends and Family:   . Attends Religious Services:   . Active Member of Clubs or Organizations:   . Attends Archivist Meetings:   Marland Kitchen Marital Status:     Family History  Problem Relation Age of Onset  . Heart disease Father   . Other Brother        open heart surgery  . Cancer Brother   . Other Brother        open heart surgery  . Other Brother        open heart surgery  . Hyperlipidemia Daughter   . Breast cancer Neg Hx   . Kidney cancer Neg Hx   . Bladder Cancer Neg Hx     Outpatient Encounter Medications as of 03/11/2020  Medication Sig  . acetaminophen (TYLENOL) 500 MG tablet Take 500 mg by mouth in the morning and at bedtime.   . carvedilol (COREG) 3.125 MG tablet Take 1 tablet (3.125 mg total) by mouth 2 (two) times daily.  . clopidogrel (PLAVIX) 75 MG tablet Take 1 tablet (75 mg total) by mouth daily.  Marland Kitchen CRANBERRY PO Take 1 tablet by mouth daily.  . Cyanocobalamin (VITAMIN B-12 IJ) Inject as directed See admin instructions. Every two months  . gabapentin (NEURONTIN) 300 MG capsule Take 1 capsule (300 mg total) by mouth at bedtime.  Marland Kitchen glipiZIDE (GLUCOTROL XL) 2.5 MG 24 hr tablet TAKE 1 TABLET BY MOUTH EVERY DAY WITH BREAKFAST  . lisinopril (ZESTRIL) 10 MG tablet Take 1 tablet (10 mg total) by mouth daily. (Patient taking differently: Take 10 mg by mouth at bedtime. )  . metFORMIN (GLUCOPHAGE-XR) 500 MG 24 hr tablet Take 1 tablet (500 mg total) by mouth daily after breakfast.  . Multiple Vitamins-Minerals (MULTIVITAMIN WITH MINERALS) tablet Take 1 tablet by mouth daily.  . nitroGLYCERIN (NITROSTAT) 0.4 MG SL tablet Place 1 tablet (0.4 mg total) under the tongue every 5 (five) minutes as needed for chest pain.  [name] Lancets 70W MISC USE TO TEST TWICE A DAY  . ONETOUCH ULTRA test strip USE AS DIRECTED TO CHECK BLOOD SUGAR TWICE DAILY AND  AS NEEDED  . pantoprazole (PROTONIX) 40 MG tablet TAKE 1 TABLET BY MOUTH EVERY  DAY (Patient taking differently: Take 20 mg by mouth daily. )  . Probiotic Product (ALIGN) 4 MG CAPS Take 1 capsule by mouth daily.  . rosuvastatin (CRESTOR) 40 MG tablet Take 1 tablet (40 mg total) by mouth daily. (Patient taking differently: Take 40 mg by mouth at bedtime. )  . [DISCONTINUED] celecoxib (CELEBREX) 200 MG capsule Take 1 capsule (200 mg total) by mouth 2 (two) times daily. (Patient not taking: Reported on 03/09/2020)  . [DISCONTINUED] traMADol (ULTRAM) 50 MG tablet Take 1 tablet (50 mg total) by mouth every 4 (four) hours as needed for moderate pain. (Patient not taking: Reported on 03/09/2020)   No facility-administered encounter medications on file as of 03/11/2020.    ALLERGIES: Allergies  Allergen Reactions  . Codeine Nausea Only    Sick to her stomach  . Erythromycin Nausea And Vomiting  . Tramadol Nausea Only and Other (See Comments)    Loss of appetite and did not eat  . Ciprofloxacin Diarrhea    Upsets stomach    VACCINATION STATUS: Immunization History  Administered Date(s) Administered  . Fluad Quad(high Dose 65+) 06/15/2019  . Influenza Split 08/10/2015  . Influenza,inj,Quad PF,6+ Mos 05/24/2016, 07/26/2017, 06/03/2018  . Influenza-Unspecified 08/10/2015, 05/24/2016  . Pneumococcal Conjugate-13 11/05/2016  . Pneumococcal Polysaccharide-23 12/11/2017  . Td 03/17/2013    Diabetes She presents for her follow-up diabetic visit. She has type 2 diabetes mellitus. Onset time: She was diagnosed at approximate age of 57 years. Her disease course has been stable. There are no hypoglycemic associated symptoms. Pertinent negatives for hypoglycemia include no confusion, headaches, pallor or seizures. Pertinent negatives for diabetes include no chest pain, no fatigue, no polydipsia, no polyphagia and no polyuria. There are no hypoglycemic complications. Symptoms are stable. Diabetic  complications include nephropathy. Risk factors for coronary artery disease include dyslipidemia, diabetes mellitus, hypertension and sedentary lifestyle. Her weight is decreasing rapidly (She lost 5 pounds since last visit.). She is following a generally unhealthy diet. When asked about meal planning, she reported none. She rarely participates in exercise. Her breakfast blood glucose range is generally 130-140 mg/dl. Her bedtime blood glucose range is generally 130-140 mg/dl. Her overall blood glucose range is 130-140 mg/dl. (She presents with controlled fasting glycemic profile, previsit A1c of 6.8%, generally improving from 8.3%.     ) An ACE inhibitor/angiotensin II receptor blocker is being taken.  Hyperlipidemia This is a chronic problem. The current episode started more than 1 year ago. Exacerbating diseases include diabetes. Pertinent negatives include no chest pain, myalgias or shortness of breath. Risk factors for coronary artery disease include dyslipidemia, diabetes mellitus, hypertension and post-menopausal.  Hypertension This is a chronic problem. The current episode started more than 1 year ago. Pertinent negatives include no chest pain, headaches, palpitations or shortness of breath. Risk factors for coronary artery disease include diabetes mellitus, dyslipidemia, sedentary lifestyle and post-menopausal state. Past treatments include beta blockers and ACE inhibitors. Hypertensive end-organ damage includes kidney disease.    Constitutional: + Minimally fluctuating body weight, no fatigue, no subjective hyperthermia, no subjective hypothermia Eyes: no blurry vision, no xerophthalmia ENT: no sore throat, no nodules palpated in throat, no dysphagia/odynophagia, no hoarseness Cardiovascular: no Chest Pain, no Shortness of Breath, no palpitations, no leg swelling Respiratory: no cough, no SOB Gastrointestinal: no Nausea/Vomiting/Diarhhea Musculoskeletal: no muscle/joint aches Skin: no  rashes Neurological: no tremors, no numbness, no tingling, no dizziness Psychiatric: no depression, no anxiety   Objective:    BP (!) 133/57   Pulse (!) 54  Ht 5\' 3"  (1.6 m)   Wt 126 lb 9.6 oz (57.4 kg)   BMI 22.43 kg/m   Wt Readings from Last 3 Encounters:  03/11/20 126 lb 9.6 oz (57.4 kg)  03/09/20 125 lb 12.8 oz (57.1 kg)  02/05/20 121 lb (54.9 kg)     Physical Exam Constitutional:      Appearance: She is well-developed.  HENT:     Head: Normocephalic and atraumatic.  Neck:     Thyroid: No thyromegaly.     Trachea: No tracheal deviation.  Cardiovascular:     Rate and Rhythm: Normal rate.  Pulmonary:     Effort: Pulmonary effort is normal.  Abdominal:     Tenderness: There is no abdominal tenderness. There is no guarding.  Musculoskeletal:        General: Normal range of motion.     Cervical back: Normal range of motion and neck supple.  Skin:    General: Skin is warm and dry.     Coloration: Skin is not pale.     Findings: No erythema or rash.  Neurological:     Mental Status: She is alert and oriented to person, place, and time.     Cranial Nerves: No cranial nerve deficit.     Coordination: Coordination normal.     Deep Tendon Reflexes: Reflexes are normal and symmetric.  Psychiatric:        Judgment: Judgment normal.      CMP ( most recent) CMP     Component Value Date/Time   NA 141 03/03/2020 0000   NA 137 03/27/2013 0545   K 4.6 03/03/2020 0000   K 3.9 03/27/2013 0545   CL 107 03/03/2020 0000   CL 103 03/27/2013 0545   CO2 25 03/03/2020 0000   CO2 27 03/27/2013 0545   GLUCOSE 146 (H) 03/03/2020 0000   GLUCOSE 122 (H) 03/27/2013 0545   BUN 23 03/03/2020 0000   BUN 10 03/27/2013 0545   CREATININE 0.98 (H) 03/03/2020 0000   CALCIUM 10.2 03/03/2020 0925   CALCIUM 8.9 03/27/2013 0545   PROT 6.8 03/03/2020 0000   PROT 7.6 10/07/2011 1316   ALBUMIN 4.5 12/29/2019 1318   ALBUMIN 4.5 10/07/2011 1316   AST 14 03/03/2020 0000   AST 26  10/07/2011 1316   ALT 10 03/03/2020 0000   ALT 26 10/07/2011 1316   ALKPHOS 47 12/29/2019 1318   ALKPHOS 46 (L) 10/07/2011 1316   BILITOT 0.4 03/03/2020 0000   BILITOT 0.5 10/07/2011 1316   GFRNONAA 54 (L) 03/03/2020 0000   GFRAA 63 03/03/2020 0000     Diabetic Labs (most recent): Lab Results  Component Value Date   HGBA1C 6.8 (H) 12/29/2019   HGBA1C 6.4 (A) 12/10/2019   HGBA1C 6.4 (A) 10/21/2019     Lipid Panel ( most recent) Lipid Panel     Component Value Date/Time   CHOL 144 03/03/2020 0000   CHOL 141 10/08/2011 0457   TRIG 127 03/03/2020 0000   TRIG 179 10/08/2011 0457   HDL 46 (L) 03/03/2020 0000   HDL 37 (L) 10/08/2011 0457   CHOLHDL 3.1 03/03/2020 0000   VLDL 29.0 03/05/2019 0916   VLDL 36 10/08/2011 0457   LDLCALC 77 03/03/2020 0000   LDLCALC 68 10/08/2011 0457      Lab Results  Component Value Date   TSH 1.66 03/03/2020   TSH 2.68 03/05/2019   TSH 2.96 11/06/2017   TSH 1.24 02/01/2017   TSH 1.86 02/06/2016   FREET4 1.4  03/03/2020      Assessment & Plan:   Type 2 diabetes complicated by stage 2 CKD  - Tracey [name] has currently controlled asymptomatic type 2 DM since  84 years of age. -She presents with controlled fasting glycemic profile, recent previsit A1c was 6.8% improving from 8.3%.     - I had a long discussion with her about the progressive nature of diabetes and the pathology behind its complications. -her diabetes is complicated by stage 2 CKD and she remains at a high risk for more acute and chronic complications which include CAD, CVA, CKD, retinopathy, and neuropathy. These are all discussed in detail with her.  - I have counseled her on diet management by adopting a carbohydrate restricted/protein rich diet.  - she  admits there is a room for improvement in her diet and drink choices. -  Suggestion is made for her to avoid simple carbohydrates  from her diet including Cakes, Sweet Desserts / Pastries, Ice Cream, Soda (diet and  regular), Sweet Tea, Candies, Chips, Cookies, Sweet Pastries,  Store Bought Juices, Alcohol in Excess of  1-2 drinks a day, Artificial Sweeteners, Coffee Creamer, and "Sugar-free" Products. This will help patient to have stable blood glucose profile and potentially avoid unintended weight gain.    - I encouraged her to switch to  unprocessed or minimally processed complex starch and increased protein intake (animal or plant source), fruits, and vegetables.  - she is advised to stick to a routine mealtimes to eat 3 meals  a day and avoid unnecessary snacks ( to snack only to correct hypoglycemia).    - I have approached her with the following individualized plan to manage  her diabetes and patient agrees:   -She has well-controlled type 2 diabetes with A1c of 6.8%. -She can cautiously continue to use Metformin at a low dose with adequate hydration.  She is advised to continue Metformin 500 mg daily after breakfast, as well as glipizide 2.5 mg XL daily at breakfast.   -She is willing to continue monitoring blood glucose twice a day-daily before breakfast and at bedtime.   - she is encouraged to call clinic for blood glucose levels less than 70 or above 200 mg /dl.   - she is not a candidate for SGLT2 inhibitors due to concurrent renal insufficiency.  - she is not a candidate for incretin therapy due to her body habitus.     - Patient specific target  A1c;  LDL, HDL, Triglycerides, and  Waist Circumference were discussed in detail.  2) Blood Pressure /Hypertension:  her blood pressure is  controlled to target.   she is advised to continue her current medications including lisinopril 10 mg p.o. daily with breakfast .  3) Lipids/Hyperlipidemia:   Review of her recent lipid panel showed  controlled  LDL at 85 .  she  is advised to continue Crestor 40 mg p.o. daily at bedtime. side effects and precautions discussed with her.  4)  Weight/Diet:  Body mass index is 22.43 kg/m.  -She is not a  candidate for weight loss.    Exercise, and detailed carbohydrates information provided  -  detailed on discharge instructions.  5) Chronic Care/Health Maintenance:  -she  is on ACEI/ARB and Statin medications and  is encouraged to initiate and continue to follow up with Ophthalmology, Dentist,  Podiatrist at least yearly or according to recommendations, and advised to   stay away from smoking. I have recommended yearly flu vaccine and pneumonia vaccine  at least every 5 years; moderate intensity exercise for up to 150 minutes weekly; and  sleep for at least 7 hours a day.  - she is  advised to maintain close follow up with [facility], Wynelle Fanny, MD for primary care needs, as well as her other providers for optimal and coordinated care.  - Time spent on this patient care encounter:  35 min, of which > 50% was spent in  counseling and the rest reviewing her blood glucose logs , discussing her hypoglycemia and hyperglycemia episodes, reviewing her current and  previous labs / studies  ( including abstraction from other facilities) and medications  doses and developing a  long term treatment plan and documenting her care.   Please refer to Patient Instructions for Blood Glucose Monitoring and Insulin/Medications Dosing Guide"  in media tab for additional information. Please  also refer to " Patient Self Inventory" in the Media  tab for reviewed elements of pertinent patient history.  Julio Sicks participated in the discussions, expressed understanding, and voiced agreement with the above plans.  All questions were answered to her satisfaction. she is encouraged to contact clinic should she have any questions or concerns prior to her return visit.    Follow up plan: - Return in about 3 months (around 06/11/2020) for Bring Meter and Logs- A1c in Office.  Glade Lloyd, MD [facility] [address] [place], [place] [address] Phone: [phone]  Fax:  [phone]    03/11/2020, 11:43 AM  This note was partially dictated with voice recognition software. Similar sounding words can be transcribed inadequately or may not  be corrected upon review.

## 2020-03-14 ENCOUNTER — Encounter (INDEPENDENT_AMBULATORY_CARE_PROVIDER_SITE_OTHER): Payer: Self-pay | Admitting: Nurse Practitioner

## 2020-03-14 ENCOUNTER — Other Ambulatory Visit: Payer: Self-pay | Admitting: "Endocrinology

## 2020-03-14 NOTE — Progress Notes (Signed)
Subjective:    Patient ID: Tracey [name], female    DOB: [birth year], 84 y.o.   MRN: [id number] Chief Complaint  Patient presents with  . Follow-up    ultrasound follow up    The patient returns to the office for followup and review of the noninvasive studies. There have been no interval changes in lower extremity symptoms. No interval shortening of the patient's claudication distance or development of rest pain symptoms. No new ulcers or wounds have occurred since the last visit.  The patient returns to the office for followup and review of the noninvasive studies regarding renal vascular hypertension and renal artery stenosis. There have been no interval changes in the patient's blood pressure control.  He denies any major changes in is medications.  The patient denies headache or flushing.  No flank or unusual back pain.     The patient recently had a left total knee replacement and is doing well.  Although, the patient does note that her legs staying at night as well as a this has been ongoing for least about 6 months or so.  She notes that there is no pain while she is awake and there is no pain with ambulation.  She has previously been given gabapentin and this was helpful with the lower extremity discomfort.  The patient denies amaurosis fugax or recent TIA symptoms. There are no recent neurological changes noted. The patient denies history of DVT, PE or superficial thrombophlebitis. The patient denies recent episodes of angina or shortness of breath.   ABI Rt=1.14 and Lt=1.15  (previous ABI's Rt=1.09 and Lt=1.07) Duplex ultrasound of the bilateral tibial arteries reveals triphasic waveforms with strong toe waveforms bilaterally.  Duplex ultrasound of the renal arteries reveals a 1 to 59% stenosis in the bilateral renal arteries.  Kidney size is normal bilaterally.  These velocities are consistent with previous studies done on 05/27/2019.   Review of Systems  Neurological: Positive  for numbness.  All other systems reviewed and are negative.      Objective:   Physical Exam Vitals reviewed.  HENT:     Head: Normocephalic.  Cardiovascular:     Rate and Rhythm: Normal rate and regular rhythm.     Pulses: Normal pulses.     Heart sounds: Normal heart sounds.  Pulmonary:     Effort: Pulmonary effort is normal.     Breath sounds: Normal breath sounds.  Neurological:     Mental Status: She is alert and oriented to person, place, and time.  Psychiatric:        Mood and Affect: Mood normal.        Behavior: Behavior normal.        Thought Content: Thought content normal.        Judgment: Judgment normal.     BP (!) 146/61 (BP Location: Right Arm)   Pulse 60   Resp 16   Wt 125 lb 12.8 oz (57.1 kg)   BMI 22.28 kg/m   Past Medical History:  Diagnosis Date  . Anxiety   . Arthritis   . Atrial fibrillation ([place])    history  . Chronic kidney disease    ckd stage II  . Coronary artery disease   . Diabetes mellitus    non insulin dependent  . Dysrhythmia    atrial fibrillation, history of  . GERD (gastroesophageal reflux disease)   . Heart murmur   . History of kidney stones   . Hyperlipidemia   . Hypertension   .  Peripheral vascular disease ([place])   . Rectocele   . Syncope 09/2019  . UTI (urinary tract infection)     Social History   Socioeconomic History  . Marital status: Widowed    Spouse name: Not on file  . Number of children: Not on file  . Years of education: Not on file  . Highest education level: Not on file  Occupational History  . Occupation: school cafeteria    Comment: retired  Tobacco Use  . Smoking status: Never Smoker  . Smokeless tobacco: Never Used  Vaping Use  . Vaping Use: Never used  Substance and Sexual Activity  . Alcohol use: No  . Drug use: No  . Sexual activity: Never  Other Topics Concern  . Not on file  Social History Narrative   Patient has 3 daughters and a significant other. Plans on going home after  surgery.   They have a plan to help the patient out once home!!   Social Determinants of Health   Financial Resource Strain:   . Difficulty of Paying Living Expenses:   Food Insecurity:   . Worried About Charity fundraiser in the Last Year:   . Arboriculturist in the Last Year:   Transportation Needs:   . Film/video editor (Medical):   Marland Kitchen Lack of Transportation (Non-Medical):   Physical Activity:   . Days of Exercise per Week:   . Minutes of Exercise per Session:   Stress:   . Feeling of Stress :   Social Connections:   . Frequency of Communication with Friends and Family:   . Frequency of Social Gatherings with Friends and Family:   . Attends Religious Services:   . Active Member of Clubs or Organizations:   . Attends Archivist Meetings:   Marland Kitchen Marital Status:   Intimate Partner Violence:   . Fear of Current or Ex-Partner:   . Emotionally Abused:   Marland Kitchen Physically Abused:   . Sexually Abused:     Past Surgical History:  Procedure Laterality Date  . ABDOMINAL AORTA STENT    . APPENDECTOMY    . BREAST BIOPSY Left    20 years ago  . BREAST BIOPSY Right 2008  . BREAST EXCISIONAL BIOPSY     rt 2008 lt "20 years ago"  . CARDIAC CATHETERIZATION    . CATARACT EXTRACTION W/PHACO Left 08/15/2015   Procedure: CATARACT EXTRACTION PHACO AND INTRAOCULAR LENS PLACEMENT (IOC);  Surgeon: Estill Cotta, MD;  Location: [facility] ORS;  Service: Ophthalmology;  Laterality: Left;  [place] 01:28 AP% 26.4 CDE 35.73 fluid pack # [id number]  . COLONOSCOPY WITH PROPOFOL N/A 01/16/2016   Procedure: COLONOSCOPY WITH PROPOFOL;  Surgeon: Manya Silvas, MD;  Location: [facility] ENDOSCOPY;  Service: Endoscopy;  Laterality: N/A;  . CORONARY ANGIOPLASTY WITH STENT PLACEMENT    . CORONARY ARTERY BYPASS GRAFT  1995   with LIMA  to the LAD, SVG to OM1, SVG to PDA  . ESOPHAGOGASTRODUODENOSCOPY (EGD) WITH PROPOFOL N/A 01/16/2016   Procedure: ESOPHAGOGASTRODUODENOSCOPY (EGD) WITH PROPOFOL;  Surgeon: Manya Silvas, MD;  Location: [facility]COPY;  Service: Endoscopy;  Laterality: N/A;  . EYE SURGERY    . JOINT REPLACEMENT Right   . KNEE ARTHROPLASTY Left 01/06/2020   Procedure: COMPUTER ASSISTED TOTAL KNEE ARTHROPLASTY;  Surgeon: Dereck Leep, MD;  Location: [facility] ORS;  Service: Orthopedics;  Laterality: Left;  . LOWER EXTREMITY ANGIOGRAPHY N/A 07/08/2019   Procedure: Abdominal Aortagraphy;  Surgeon: Algernon Huxley, MD;  Location: [place] CV LAB;  Service: Cardiovascular;  Laterality: N/A;  . RENAL ANGIOGRAPHY Left 08/26/2019   Procedure: RENAL ANGIOGRAPHY;  Surgeon: Algernon Huxley, MD;  Location: [place] [facility];  Service: Cardiovascular;  Laterality: Left;  . RENAL ARTERY STENT     left, By Dr Hulda Humphrey  . REPLACEMENT TOTAL KNEE  03/25/2013   right  . TONSILLECTOMY      Family History  Problem Relation Age of Onset  . Heart disease Father   . Other Brother        open heart surgery  . Cancer Brother   . Other Brother        open heart surgery  . Other Brother        open heart surgery  . Hyperlipidemia Daughter   . Breast cancer Neg Hx   . Kidney cancer Neg Hx   . Bladder Cancer Neg Hx     Allergies  Allergen Reactions  . Codeine Nausea Only    Sick to her stomach  . Erythromycin Nausea And Vomiting  . Tramadol Nausea Only and Other (See Comments)    Loss of appetite and did not eat  . Ciprofloxacin Diarrhea    Upsets stomach       Assessment & Plan:   1. Atherosclerosis of renal artery (HCC) BP today was acceptable Given patient's atherosclerosis and PAD optimal control of the patient's hypertension is important.  The patient's BP and noninvasive studies support the previous intervention is patent. No further intervention is indicated at this time.  Therefore the patient  will continue the current medications, no changes at this time.  The primary medical service will continue aggressive antihypertensive therapy as per the AHA guidelines    2. Essential  hypertension Continue antihypertensive medications as already ordered, these medications have been reviewed and there are no changes at this time.   3. PAD (peripheral artery disease) (HCC)  Recommend:  The patient has evidence of atherosclerosis of the lower extremities with claudication.  The patient does not voice lifestyle limiting changes at this point in time.  Noninvasive studies do not suggest clinically significant change.  No invasive studies, angiography or surgery at this time The patient should continue walking and begin a more formal exercise program.  The patient should continue antiplatelet therapy and aggressive treatment of the lipid abnormalities  No changes in the patient's medications at this time  The patient should continue wearing graduated compression socks 10-15 mmHg strength to control the mild edema.   Patient's lower extremity discomfort sounds consistent with either neuropathy or restless leg syndrome.  We will place an order for gabapentin that this is helpful for her with her lower extremity leg pain.  Otherwise patient should continue to follow-up with primary care provider for work-up.  - gabapentin (NEURONTIN) 300 MG capsule; Take 1 capsule (300 mg total) by mouth at bedtime.  Dispense: 30 capsule; Refill: 6   Current Outpatient Medications on File Prior to Visit  Medication Sig Dispense Refill  . acetaminophen (TYLENOL) 500 MG tablet Take 500 mg by mouth in the morning and at bedtime.     . carvedilol (COREG) 3.125 MG tablet Take 1 tablet (3.125 mg total) by mouth 2 (two) times daily. 180 tablet 3  . clopidogrel (PLAVIX) 75 MG tablet Take 1 tablet (75 mg total) by mouth daily. 90 tablet 3  . CRANBERRY PO Take 1 tablet by mouth daily.    . Cyanocobalamin (VITAMIN B-12 IJ) Inject as directed  See admin instructions. Every two months    . glipiZIDE (GLUCOTROL XL) 2.5 MG 24 hr tablet TAKE 1 TABLET BY MOUTH EVERY DAY WITH BREAKFAST 90 tablet 0  .  lisinopril (ZESTRIL) 10 MG tablet Take 1 tablet (10 mg total) by mouth daily. (Patient taking differently: Take 10 mg by mouth at bedtime. ) 90 tablet 3  . metFORMIN (GLUCOPHAGE-XR) 500 MG 24 hr tablet Take 1 tablet (500 mg total) by mouth daily after breakfast. 90 tablet 1  . Multiple Vitamins-Minerals (MULTIVITAMIN WITH MINERALS) tablet Take 1 tablet by mouth daily.    . nitroGLYCERIN (NITROSTAT) 0.4 MG SL tablet Place 1 tablet (0.4 mg total) under the tongue every 5 (five) minutes as needed for chest pain. 25 tablet 1  . OneTouch Delica Lancets 16I MISC USE TO TEST TWICE A DAY 100 each 1  . ONETOUCH ULTRA test strip USE AS DIRECTED TO CHECK BLOOD SUGAR TWICE DAILY AND AS NEEDED 100 strip 2  . pantoprazole (PROTONIX) 40 MG tablet TAKE 1 TABLET BY MOUTH EVERY DAY (Patient taking differently: Take 20 mg by mouth daily. ) 90 tablet 1  . Probiotic Product (ALIGN) 4 MG CAPS Take 1 capsule by mouth daily.    . rosuvastatin (CRESTOR) 40 MG tablet Take 1 tablet (40 mg total) by mouth daily. (Patient taking differently: Take 40 mg by mouth at bedtime. ) 90 tablet 3   No current facility-administered medications on file prior to visit.    There are no Patient Instructions on file for this visit. No follow-ups on file.   Kris Hartmann, NP

## 2020-03-24 ENCOUNTER — Other Ambulatory Visit: Payer: Self-pay | Admitting: Cardiovascular Disease

## 2020-03-26 ENCOUNTER — Other Ambulatory Visit: Payer: Self-pay | Admitting: "Endocrinology

## 2020-03-28 DIAGNOSIS — R69 Illness, unspecified: Secondary | ICD-10-CM | POA: Diagnosis not present

## 2020-03-29 ENCOUNTER — Telehealth: Payer: Self-pay

## 2020-03-29 NOTE — Telephone Encounter (Signed)
Plavix and asa can certainly both cause this and as skin gets thinner with age- bruising can become even easier.   However-in light of her anemia I think we should check cbc with platelets when able  Please schedule lab appt

## 2020-03-29 NOTE — Telephone Encounter (Signed)
[name] notified and will get lab appt scheduled

## 2020-03-29 NOTE — Telephone Encounter (Signed)
[  11:50 AM] Tracey [name]     HEY when you have time can you put in a phone encounter to [name] on my grandmother:  Tracey [name] 10/12/1935.  She has been having significant, acute bruising to arms and legs and also noticed easy bleeding from wounds on skin that she doesn't recall getting.  No blood from nosebleeds/urine/bowel etc, no SOB and no signs of blood clotting.  No changes in medication other than increasing iron dosage in past month.  She is holding her ASA (i did not tell her to do this, lol) is on Plavix and no other changes in diet or meds that she can account for.  This is a sudden change in recent weeks and she is wondering if she should be seen or have any labs.

## 2020-03-31 ENCOUNTER — Other Ambulatory Visit: Payer: Self-pay

## 2020-03-31 ENCOUNTER — Other Ambulatory Visit (INDEPENDENT_AMBULATORY_CARE_PROVIDER_SITE_OTHER): Payer: Medicare HMO

## 2020-03-31 DIAGNOSIS — D649 Anemia, unspecified: Secondary | ICD-10-CM

## 2020-03-31 LAB — CBC WITH DIFFERENTIAL/PLATELET
Basophils Absolute: 0 10*3/uL (ref 0.0–0.1)
Basophils Relative: 0.7 % (ref 0.0–3.0)
Eosinophils Absolute: 0.1 10*3/uL (ref 0.0–0.7)
Eosinophils Relative: 3 % (ref 0.0–5.0)
HCT: 33.4 % — ABNORMAL LOW (ref 36.0–46.0)
Hemoglobin: 10.9 g/dL — ABNORMAL LOW (ref 12.0–15.0)
Lymphocytes Relative: 23.4 % (ref 12.0–46.0)
Lymphs Abs: 0.9 10*3/uL (ref 0.7–4.0)
MCHC: 32.5 g/dL (ref 30.0–36.0)
MCV: 81.9 fl (ref 78.0–100.0)
Monocytes Absolute: 0.4 10*3/uL (ref 0.1–1.0)
Monocytes Relative: 10.7 % (ref 3.0–12.0)
Neutro Abs: 2.4 10*3/uL (ref 1.4–7.7)
Neutrophils Relative %: 62.2 % (ref 43.0–77.0)
Platelets: 154 10*3/uL (ref 150.0–400.0)
RBC: 4.08 Mil/uL (ref 3.87–5.11)
RDW: 20.6 % — ABNORMAL HIGH (ref 11.5–15.5)
WBC: 3.8 10*3/uL — ABNORMAL LOW (ref 4.0–10.5)

## 2020-03-31 LAB — FERRITIN: Ferritin: 17.8 ng/mL (ref 10.0–291.0)

## 2020-04-07 ENCOUNTER — Other Ambulatory Visit: Payer: Self-pay | Admitting: Cardiovascular Disease

## 2020-04-12 DIAGNOSIS — D3131 Benign neoplasm of right choroid: Secondary | ICD-10-CM | POA: Diagnosis not present

## 2020-04-12 DIAGNOSIS — E119 Type 2 diabetes mellitus without complications: Secondary | ICD-10-CM | POA: Diagnosis not present

## 2020-04-12 LAB — HM DIABETES EYE EXAM

## 2020-04-20 ENCOUNTER — Other Ambulatory Visit: Payer: Self-pay | Admitting: "Endocrinology

## 2020-04-21 ENCOUNTER — Encounter: Payer: Self-pay | Admitting: Family Medicine

## 2020-05-13 ENCOUNTER — Other Ambulatory Visit: Payer: Self-pay | Admitting: "Endocrinology

## 2020-05-26 DIAGNOSIS — Z20822 Contact with and (suspected) exposure to covid-19: Secondary | ICD-10-CM | POA: Diagnosis not present

## 2020-06-14 ENCOUNTER — Ambulatory Visit: Payer: Medicare HMO | Admitting: Nurse Practitioner

## 2020-06-17 ENCOUNTER — Other Ambulatory Visit: Payer: Self-pay | Admitting: "Endocrinology

## 2020-06-24 ENCOUNTER — Other Ambulatory Visit: Payer: Self-pay

## 2020-06-24 ENCOUNTER — Ambulatory Visit (INDEPENDENT_AMBULATORY_CARE_PROVIDER_SITE_OTHER): Payer: Medicare HMO | Admitting: Nurse Practitioner

## 2020-06-24 ENCOUNTER — Encounter: Payer: Self-pay | Admitting: Nurse Practitioner

## 2020-06-24 VITALS — BP 126/68 | HR 58 | Ht 63.0 in | Wt 127.4 lb

## 2020-06-24 DIAGNOSIS — R69 Illness, unspecified: Secondary | ICD-10-CM | POA: Diagnosis not present

## 2020-06-24 DIAGNOSIS — E1122 Type 2 diabetes mellitus with diabetic chronic kidney disease: Secondary | ICD-10-CM

## 2020-06-24 DIAGNOSIS — E782 Mixed hyperlipidemia: Secondary | ICD-10-CM

## 2020-06-24 DIAGNOSIS — N182 Chronic kidney disease, stage 2 (mild): Secondary | ICD-10-CM | POA: Diagnosis not present

## 2020-06-24 DIAGNOSIS — I10 Essential (primary) hypertension: Secondary | ICD-10-CM | POA: Diagnosis not present

## 2020-06-24 LAB — POCT GLYCOSYLATED HEMOGLOBIN (HGB A1C): Hemoglobin A1C: 6.7 % — AB (ref 4.0–5.6)

## 2020-06-24 MED ORDER — ACCU-CHEK GUIDE VI STRP: ORAL_STRIP | 12 refills | Status: DC

## 2020-06-24 NOTE — Patient Instructions (Signed)

## 2020-06-24 NOTE — Progress Notes (Signed)
06/24/2020, 12:03 PM  Endocrinology follow-up note   Subjective:    Patient ID: Tracey [name], female    DOB: 1936-01-22.  Tracey [name] is being seen  in follow-up after she was seen in consultation for management of currently uncontrolled symptomatic diabetes requested by  Abner Greenspan, MD.   Past Medical History:  Diagnosis Date  . Anxiety   . Arthritis   . Atrial fibrillation ([place])    history  . Chronic kidney disease    ckd stage II  . Coronary artery disease   . Diabetes mellitus    non insulin dependent  . Dysrhythmia    atrial fibrillation, history of  . GERD (gastroesophageal reflux disease)   . Heart murmur   . History of kidney stones   . Hyperlipidemia   . Hypertension   . Peripheral vascular disease ([place])   . Rectocele   . Syncope 09/2019  . UTI (urinary tract infection)     Past Surgical History:  Procedure Laterality Date  . ABDOMINAL AORTA STENT    . APPENDECTOMY    . BREAST BIOPSY Left    20 years ago  . BREAST BIOPSY Right 2008  . BREAST EXCISIONAL BIOPSY     rt 2008 lt "20 years ago"  . CARDIAC CATHETERIZATION    . CATARACT EXTRACTION W/PHACO Left 08/15/2015   Procedure: CATARACT EXTRACTION PHACO AND INTRAOCULAR LENS PLACEMENT (IOC);  Surgeon: Estill Cotta, MD;  Location: [facility] ORS;  Service: Ophthalmology;  Laterality: Left;  [place] 01:28 AP% 26.4 CDE 35.73 fluid pack # [id number]  . COLONOSCOPY WITH PROPOFOL N/A 01/16/2016   Procedure: COLONOSCOPY WITH PROPOFOL;  Surgeon: Manya Silvas, MD;  Location: [facility] ENDOSCOPY;  Service: Endoscopy;  Laterality: N/A;  . CORONARY ANGIOPLASTY WITH STENT PLACEMENT    . CORONARY ARTERY BYPASS GRAFT  1995   with LIMA  to the LAD, SVG to OM1, SVG to PDA  . ESOPHAGOGASTRODUODENOSCOPY (EGD) WITH PROPOFOL N/A 01/16/2016   Procedure: ESOPHAGOGASTRODUODENOSCOPY (EGD) WITH PROPOFOL;  Surgeon: Manya Silvas, MD;  Location:  [facility] ENDOSCOPY;  Service: Endoscopy;  Laterality: N/A;  . EYE SURGERY    . JOINT REPLACEMENT Right   . KNEE ARTHROPLASTY Left 01/06/2020   Procedure: COMPUTER ASSISTED TOTAL KNEE ARTHROPLASTY;  Surgeon: Dereck Leep, MD;  Location: [facility] ORS;  Service: Orthopedics;  Laterality: Left;  . LOWER EXTREMITY ANGIOGRAPHY N/A 07/08/2019   Procedure: Abdominal Aortagraphy;  Surgeon: Algernon Huxley, MD;  Location: [place] CV LAB;  Service: Cardiovascular;  Laterality: N/A;  . RENAL ANGIOGRAPHY Left 08/26/2019   Procedure: RENAL ANGIOGRAPHY;  Surgeon: Algernon Huxley, MD;  Location: [place] CV LAB;  Service: Cardiovascular;  Laterality: Left;  . RENAL ARTERY STENT     left, By Dr Hulda Humphrey  . REPLACEMENT TOTAL KNEE  03/25/2013   right  . TONSILLECTOMY      Social History   Socioeconomic History  . Marital status: Widowed    Spouse name: Not on file  . Number of children: Not on file  . Years of education: Not on file  . Highest education level: Not  on file  Occupational History  . Occupation: school cafeteria    Comment: retired  Tobacco Use  . Smoking status: Never Smoker  . Smokeless tobacco: Never Used  Vaping Use  . Vaping Use: Never used  Substance and Sexual Activity  . Alcohol use: No  . Drug use: No  . Sexual activity: Never  Other Topics Concern  . Not on file  Social History Narrative   Patient has 3 daughters and a significant other. Plans on going home after surgery.   They have a plan to help the patient out once home!!   Social Determinants of Health   Financial Resource Strain:   . Difficulty of Paying Living Expenses: Not on file  Food Insecurity:   . Worried About Charity fundraiser in the Last Year: Not on file  . Ran Out of Food in the Last Year: Not on file  Transportation Needs:   . Lack of Transportation (Medical): Not on file  . Lack of Transportation (Non-Medical): Not on file  Physical Activity:   . Days of Exercise per Week: Not on file  .  Minutes of Exercise per Session: Not on file  Stress:   . Feeling of Stress : Not on file  Social Connections:   . Frequency of Communication with Friends and Family: Not on file  . Frequency of Social Gatherings with Friends and Family: Not on file  . Attends Religious Services: Not on file  . Active Member of Clubs or Organizations: Not on file  . Attends Archivist Meetings: Not on file  . Marital Status: Not on file    Family History  Problem Relation Age of Onset  . Heart disease Father   . Other Brother        open heart surgery  . Cancer Brother   . Other Brother        open heart surgery  . Other Brother        open heart surgery  . Hyperlipidemia Daughter   . Breast cancer Neg Hx   . Kidney cancer Neg Hx   . Bladder Cancer Neg Hx     Outpatient Encounter Medications as of 06/24/2020  Medication Sig  . acetaminophen (TYLENOL) 500 MG tablet Take 500 mg by mouth in the morning and at bedtime.   . carvedilol (COREG) 3.125 MG tablet Take 1 tablet (3.125 mg total) by mouth 2 (two) times daily.  . clopidogrel (PLAVIX) 75 MG tablet Take 1 tablet (75 mg total) by mouth daily.  Marland Kitchen CRANBERRY PO Take 1 tablet by mouth daily.  . Cyanocobalamin (VITAMIN B-12 IJ) Inject as directed See admin instructions. Every two months  . gabapentin (NEURONTIN) 300 MG capsule Take 1 capsule (300 mg total) by mouth at bedtime.  Marland Kitchen glipiZIDE (GLUCOTROL XL) 2.5 MG 24 hr tablet TAKE 1 TABLET BY MOUTH EVERY DAY WITH BREAKFAST  . lisinopril (ZESTRIL) 10 MG tablet Take 1 tablet (10 mg total) by mouth daily. (Patient taking differently: Take 10 mg by mouth at bedtime. )  . metFORMIN (GLUCOPHAGE-XR) 500 MG 24 hr tablet TAKE 1 TABLET BY MOUTH EVERY DAY AFTER BREAKFAST  . Multiple Vitamins-Minerals (MULTIVITAMIN WITH MINERALS) tablet Take 1 tablet by mouth daily.  . nitroGLYCERIN (NITROSTAT) 0.4 MG SL tablet PLACE 1 TABLET (0.4 MG TOTAL) UNDER THE TONGUE EVERY 5 (FIVE) MINUTES AS NEEDED FOR CHEST  PAIN.  [name] Lancets 62E MISC USE TO TEST TWICE A DAY  . pantoprazole (PROTONIX) 40  MG tablet TAKE 1 TABLET BY MOUTH EVERY DAY (Patient taking differently: Take 20 mg by mouth daily. )  . Probiotic Product (ALIGN) 4 MG CAPS Take 1 capsule by mouth daily.  . rosuvastatin (CRESTOR) 40 MG tablet Take 1 tablet (40 mg total) by mouth daily. (Patient taking differently: Take 40 mg by mouth at bedtime. )  . [DISCONTINUED] ONETOUCH ULTRA test strip USE AS DIRECTED TO CHECK BLOOD SUGAR TWICE DAILY AND AS NEEDED  . glucose blood (ACCU-CHEK GUIDE) test strip Use as instructed to check blood glucose 1-2 times daily.   No facility-administered encounter medications on file as of 06/24/2020.    ALLERGIES: Allergies  Allergen Reactions  . Codeine Nausea Only    Sick to her stomach  . Erythromycin Nausea And Vomiting  . Tramadol Nausea Only and Other (See Comments)    Loss of appetite and did not eat  . Ciprofloxacin Diarrhea    Upsets stomach    VACCINATION STATUS: Immunization History  Administered Date(s) Administered  . Fluad Quad(high Dose 65+) 06/15/2019  . Influenza Split 08/10/2015  . Influenza,inj,Quad PF,6+ Mos 05/24/2016, 07/26/2017, 06/03/2018  . Influenza-Unspecified 08/10/2015, 05/24/2016  . Pneumococcal Conjugate-13 11/05/2016  . Pneumococcal Polysaccharide-23 12/11/2017  . Td 03/17/2013    Diabetes She presents for her follow-up diabetic visit. She has type 2 diabetes mellitus. Onset time: She was diagnosed at approximate age of 48 years. Her disease course has been stable. There are no hypoglycemic associated symptoms. Pertinent negatives for hypoglycemia include no confusion, headaches, pallor or seizures. Pertinent negatives for diabetes include no chest pain, no fatigue, no polydipsia, no polyphagia and no polyuria. There are no hypoglycemic complications. Symptoms are stable. Diabetic complications include heart disease, nephropathy and PVD. Risk factors for  coronary artery disease include dyslipidemia, diabetes mellitus, hypertension, sedentary lifestyle and post-menopausal. Current diabetic treatment includes oral agent (dual therapy). She is compliant with treatment all of the time. Her weight is fluctuating minimally. She is following a generally healthy diet. Meal planning includes avoidance of concentrated sweets. She rarely participates in exercise. Her home blood glucose trend is fluctuating minimally. Her breakfast blood glucose range is generally 110-130 mg/dl. (She presents today with her meter and logs showing controlled fasting glycemic profile.  Her POCT A1C today is 6.7%, improving from last visit of 6.8%.  She denies any episodes of hypoglycemia.  ) An ACE inhibitor/angiotensin II receptor blocker is being taken. She does not see a podiatrist.Eye exam is current.  Hyperlipidemia This is a chronic problem. The current episode started more than 1 year ago. The problem is controlled. Recent lipid tests were reviewed and are normal. Exacerbating diseases include chronic renal disease and diabetes. Factors aggravating her hyperlipidemia include beta blockers. Pertinent negatives include no chest pain, myalgias or shortness of breath. Current antihyperlipidemic treatment includes statins. The current treatment provides moderate improvement of lipids. There are no compliance problems.  Risk factors for coronary artery disease include dyslipidemia, diabetes mellitus, hypertension, post-menopausal and a sedentary lifestyle.  Hypertension This is a chronic problem. The current episode started more than 1 year ago. The problem has been gradually improving since onset. The problem is controlled. Pertinent negatives include no chest pain, headaches, palpitations or shortness of breath. There are no associated agents to hypertension. Risk factors for coronary artery disease include diabetes mellitus, dyslipidemia, sedentary lifestyle and post-menopausal state.  Past treatments include beta blockers and ACE inhibitors. The current treatment provides moderate improvement. There are no compliance problems.  Hypertensive end-organ damage includes kidney  disease, CAD/MI and PVD. Identifiable causes of hypertension include chronic renal disease.    Review of systems  Constitutional: + Minimally fluctuating body weight,  current Body mass index is 22.57 kg/m. , no fatigue, no subjective hyperthermia, no subjective hypothermia Eyes: no blurry vision, no xerophthalmia ENT: no sore throat, no nodules palpated in throat, no dysphagia/odynophagia, no hoarseness Cardiovascular: no chest pain, no shortness of breath, no palpitations, no leg swelling Respiratory: no cough, no shortness of breath Gastrointestinal: no nausea/vomiting/diarrhea Musculoskeletal: no muscle/joint aches Skin: no rashes, no hyperemia Neurological: no tremors, no numbness, no tingling, no dizziness Psychiatric: no depression, no anxiety   Objective:    BP 126/68 (BP Location: Right Arm, Patient Position: Sitting)   Pulse (!) 58   Ht 5\' 3"  (1.6 m)   Wt 127 lb 6.4 oz (57.8 kg)   BMI 22.57 kg/m   Wt Readings from Last 3 Encounters:  06/24/20 127 lb 6.4 oz (57.8 kg)  03/11/20 126 lb 9.6 oz (57.4 kg)  03/09/20 125 lb 12.8 oz (57.1 kg)    BP Readings from Last 3 Encounters:  06/24/20 126/68  03/11/20 (!) 133/57  03/09/20 (!) 146/61     Physical Exam- Limited  Constitutional:  Body mass index is 22.57 kg/m. , not in acute distress, normal state of mind Eyes:  EOMI, no exophthalmos Neck: Supple Thyroid: No gross goiter Cardiovascular: RRR, no murmers, rubs, or gallops, no edema Respiratory: Adequate breathing efforts, no crackles, rales, rhonchi, or wheezing Musculoskeletal: no gross deformities, strength intact in all four extremities, no gross restriction of joint movements Skin:  no rashes, no hyperemia Neurological: no tremor with outstretched hands   CMP ( most  recent) CMP     Component Value Date/Time   NA 141 03/03/2020 0000   NA 137 03/27/2013 0545   K 4.6 03/03/2020 0000   K 3.9 03/27/2013 0545   CL 107 03/03/2020 0000   CL 103 03/27/2013 0545   CO2 25 03/03/2020 0000   CO2 27 03/27/2013 0545   GLUCOSE 146 (H) 03/03/2020 0000   GLUCOSE 122 (H) 03/27/2013 0545   BUN 23 03/03/2020 0000   BUN 10 03/27/2013 0545   CREATININE 0.98 (H) 03/03/2020 0000   CALCIUM 10.2 03/03/2020 0925   CALCIUM 8.9 03/27/2013 0545   PROT 6.8 03/03/2020 0000   PROT 7.6 10/07/2011 1316   ALBUMIN 4.5 12/29/2019 1318   ALBUMIN 4.5 10/07/2011 1316   AST 14 03/03/2020 0000   AST 26 10/07/2011 1316   ALT 10 03/03/2020 0000   ALT 26 10/07/2011 1316   ALKPHOS 47 12/29/2019 1318   ALKPHOS 46 (L) 10/07/2011 1316   BILITOT 0.4 03/03/2020 0000   BILITOT 0.5 10/07/2011 1316   GFRNONAA 54 (L) 03/03/2020 0000   GFRAA 63 03/03/2020 0000     Diabetic Labs (most recent): Lab Results  Component Value Date   HGBA1C 6.7 (A) 06/24/2020   HGBA1C 6.8 (H) 12/29/2019   HGBA1C 6.4 (A) 12/10/2019     Lipid Panel ( most recent) Lipid Panel     Component Value Date/Time   CHOL 144 03/03/2020 0000   CHOL 141 10/08/2011 0457   TRIG 127 03/03/2020 0000   TRIG 179 10/08/2011 0457   HDL 46 (L) 03/03/2020 0000   HDL 37 (L) 10/08/2011 0457   CHOLHDL 3.1 03/03/2020 0000   VLDL 29.0 03/05/2019 0916   VLDL 36 10/08/2011 0457   LDLCALC 77 03/03/2020 0000   LDLCALC 68 10/08/2011 0457  Lab Results  Component Value Date   TSH 1.66 03/03/2020   TSH 2.68 03/05/2019   TSH 2.96 11/06/2017   TSH 1.24 02/01/2017   TSH 1.86 02/06/2016   FREET4 1.4 03/03/2020      Assessment & Plan:   Type 2 diabetes complicated by stage 2 CKD  - Tracey [name] has currently controlled asymptomatic type 2 DM since  84 years of age.  She presents today with her meter and logs showing controlled fasting glycemic profile.  Her POCT A1C today is 6.7%, improving from last visit of  6.8%.  She denies any episodes of hypoglycemia.    - I had a long discussion with her about the progressive nature of diabetes and the pathology behind its complications. -her diabetes is complicated by stage 2 CKD and she remains at a high risk for more acute and chronic complications which include CAD, CVA, CKD, retinopathy, and neuropathy. These are all discussed in detail with her.  - Nutritional counseling repeated at each appointment due to patients tendency to fall back in to old habits.  - The patient admits there is a room for improvement in their diet and drink choices. -  Suggestion is made for the patient to avoid simple carbohydrates from their diet including Cakes, Sweet Desserts / Pastries, Ice Cream, Soda (diet and regular), Sweet Tea, Candies, Chips, Cookies, Sweet Pastries,  Store Bought Juices, Alcohol in Excess of  1-2 drinks a day, Artificial Sweeteners, Coffee Creamer, and "Sugar-free" Products. This will help patient to have stable blood glucose profile and potentially avoid unintended weight gain.   - I encouraged the patient to switch to  unprocessed or minimally processed complex starch and increased protein intake (animal or plant source), fruits, and vegetables.   - Patient is advised to stick to a routine mealtimes to eat 3 meals  a day and avoid unnecessary snacks ( to snack only to correct hypoglycemia).  - I have approached her with the following individualized plan to manage  her diabetes and patient agrees:   -Given her control of diabetes, she can continue with current medication plan. -She is advised to continue Metformin 500 mg XR po daily with breakfast and Glipizide 2.5 mg XL PO daily with breakfast.  Will monitor kidney function prior to next visit and determine whether she can continue on Metformin or not.  -She is encouraged to continue monitoring blood glucose at least once daily, before breakfast and call the clinic if she gets readings less than 70 or  greater than 200 for 3 tests in a row.  - she is not a candidate for SGLT2 inhibitors due to concurrent renal insufficiency.  - she is not a candidate for incretin therapy due to her body habitus.    - Patient specific target  A1c;  LDL, HDL, Triglycerides, and  Waist Circumference were discussed in detail.  2) Blood Pressure /Hypertension:  Her blood pressure is controlled to target.  She is advised to continue Coreg 3.125 twice daily and lisinopril 10 mg po daily.  3) Lipids/Hyperlipidemia: Her most recent lipid panel from 03/03/20 shows controlled LDL at 77.  She is advised to continue Crestor 40 mg po daily at bedtime.  Side effects and precautions discussed with her.  4)  Weight/Diet: Her Body mass index is 22.57 kg/m.  -She is not a candidate for weight loss.  Exercise, and detailed carbohydrates information provided  -  detailed on discharge instructions.  5) Chronic Care/Health Maintenance: -she  is  on ACEI/ARB and Statin medications and  is encouraged to initiate and continue to follow up with Ophthalmology, Dentist,  Podiatrist at least yearly or according to recommendations, and advised to   stay away from smoking. I have recommended yearly flu vaccine and pneumonia vaccine at least every 5 years; moderate intensity exercise for up to 150 minutes weekly; and  sleep for at least 7 hours a day.  - she is  advised to maintain close follow up with [facility], Wynelle Fanny, MD for primary care needs, as well as her other providers for optimal and coordinated care.  - Time spent on this patient care encounter:  30 min, of which > 50% was spent in  counseling and the rest reviewing her blood glucose logs , discussing her hypoglycemia and hyperglycemia episodes, reviewing her current and  previous labs / studies  ( including abstraction from other facilities) and medications  doses and developing a  long term treatment plan and documenting her care.   Please refer to Patient Instructions for Blood  Glucose Monitoring and Insulin/Medications Dosing Guide"  in media tab for additional information. Please  also refer to " Patient Self Inventory" in the Media  tab for reviewed elements of pertinent patient history.  Julio Sicks participated in the discussions, expressed understanding, and voiced agreement with the above plans.  All questions were answered to her satisfaction. she is encouraged to contact clinic should she have any questions or concerns prior to her return visit.    Follow up plan: - Return for Diabetes follow up with A1c in office, Previsit labs.  Rayetta Pigg, [facility] [address] [place], [place] [address] Phone: [phone] Fax: [phone]  06/24/2020, 12:03 PM  This note was partially dictated with voice recognition software. Similar sounding words can be transcribed inadequately or may not  be corrected upon review.

## 2020-06-30 ENCOUNTER — Other Ambulatory Visit: Payer: Self-pay

## 2020-06-30 ENCOUNTER — Encounter: Payer: Medicare HMO | Admitting: Obstetrics and Gynecology

## 2020-06-30 MED ORDER — ACCU-CHEK GUIDE VI STRP: ORAL_STRIP | 12 refills | Status: DC

## 2020-06-30 MED ORDER — LANCETS MISC: 1.0000 | Freq: Two times a day (BID) | 12 refills | Status: DC

## 2020-07-21 ENCOUNTER — Encounter: Payer: Medicare HMO | Admitting: Obstetrics and Gynecology

## 2020-07-29 ENCOUNTER — Ambulatory Visit (INDEPENDENT_AMBULATORY_CARE_PROVIDER_SITE_OTHER): Payer: Medicare HMO

## 2020-07-29 ENCOUNTER — Other Ambulatory Visit: Payer: Self-pay

## 2020-07-29 DIAGNOSIS — E538 Deficiency of other specified B group vitamins: Secondary | ICD-10-CM | POA: Diagnosis not present

## 2020-07-29 MED ORDER — CYANOCOBALAMIN 1000 MCG/ML IJ SOLN
1000.0000 ug | Freq: Once | INTRAMUSCULAR | Status: AC
  Administered 2020-07-29: 1000 ug via INTRAMUSCULAR

## 2020-07-29 NOTE — Progress Notes (Signed)
Per orders of [name] injection of Vitamin B12 given by Diamond Nickel, RN.  Administered 18ml to left deltoid.    Patient tolerated injection well.

## 2020-08-31 ENCOUNTER — Encounter: Payer: Medicare HMO | Admitting: Obstetrics and Gynecology

## 2020-09-05 ENCOUNTER — Other Ambulatory Visit (INDEPENDENT_AMBULATORY_CARE_PROVIDER_SITE_OTHER): Payer: Self-pay | Admitting: Nurse Practitioner

## 2020-09-05 DIAGNOSIS — I701 Atherosclerosis of renal artery: Secondary | ICD-10-CM

## 2020-09-05 DIAGNOSIS — I739 Peripheral vascular disease, unspecified: Secondary | ICD-10-CM

## 2020-09-07 ENCOUNTER — Encounter (INDEPENDENT_AMBULATORY_CARE_PROVIDER_SITE_OTHER): Payer: Medicare HMO

## 2020-09-07 ENCOUNTER — Ambulatory Visit (INDEPENDENT_AMBULATORY_CARE_PROVIDER_SITE_OTHER): Payer: Medicare HMO | Admitting: Nurse Practitioner

## 2020-09-18 ENCOUNTER — Other Ambulatory Visit (INDEPENDENT_AMBULATORY_CARE_PROVIDER_SITE_OTHER): Payer: Self-pay | Admitting: Nurse Practitioner

## 2020-09-18 DIAGNOSIS — I739 Peripheral vascular disease, unspecified: Secondary | ICD-10-CM

## 2020-09-21 ENCOUNTER — Ambulatory Visit (INDEPENDENT_AMBULATORY_CARE_PROVIDER_SITE_OTHER): Payer: Medicare Other

## 2020-09-21 ENCOUNTER — Ambulatory Visit (INDEPENDENT_AMBULATORY_CARE_PROVIDER_SITE_OTHER): Payer: Medicare HMO | Admitting: Nurse Practitioner

## 2020-09-21 ENCOUNTER — Other Ambulatory Visit: Payer: Self-pay

## 2020-09-21 DIAGNOSIS — I739 Peripheral vascular disease, unspecified: Secondary | ICD-10-CM | POA: Diagnosis not present

## 2020-09-21 DIAGNOSIS — I701 Atherosclerosis of renal artery: Secondary | ICD-10-CM

## 2020-09-22 ENCOUNTER — Encounter (INDEPENDENT_AMBULATORY_CARE_PROVIDER_SITE_OTHER): Payer: Self-pay | Admitting: *Deleted

## 2020-09-22 ENCOUNTER — Other Ambulatory Visit (INDEPENDENT_AMBULATORY_CARE_PROVIDER_SITE_OTHER): Payer: Self-pay | Admitting: Vascular Surgery

## 2020-09-22 DIAGNOSIS — Q251 Coarctation of aorta: Secondary | ICD-10-CM

## 2020-09-22 DIAGNOSIS — I701 Atherosclerosis of renal artery: Secondary | ICD-10-CM

## 2020-09-22 DIAGNOSIS — Z95828 Presence of other vascular implants and grafts: Secondary | ICD-10-CM

## 2020-09-23 ENCOUNTER — Other Ambulatory Visit: Payer: Medicare Other

## 2020-09-23 ENCOUNTER — Other Ambulatory Visit: Payer: Self-pay

## 2020-09-23 DIAGNOSIS — Z20822 Contact with and (suspected) exposure to covid-19: Secondary | ICD-10-CM | POA: Diagnosis not present

## 2020-09-27 LAB — NOVEL CORONAVIRUS, NAA: SARS-CoV-2, NAA: NOT DETECTED

## 2020-10-21 ENCOUNTER — Other Ambulatory Visit (INDEPENDENT_AMBULATORY_CARE_PROVIDER_SITE_OTHER): Payer: Medicare Other

## 2020-10-21 ENCOUNTER — Telehealth (INDEPENDENT_AMBULATORY_CARE_PROVIDER_SITE_OTHER): Payer: Medicare Other | Admitting: Family Medicine

## 2020-10-21 DIAGNOSIS — D649 Anemia, unspecified: Secondary | ICD-10-CM

## 2020-10-21 DIAGNOSIS — I10 Essential (primary) hypertension: Secondary | ICD-10-CM | POA: Diagnosis not present

## 2020-10-21 DIAGNOSIS — E538 Deficiency of other specified B group vitamins: Secondary | ICD-10-CM | POA: Diagnosis not present

## 2020-10-21 LAB — CBC WITH DIFFERENTIAL/PLATELET
Basophils Absolute: 0 10*3/uL (ref 0.0–0.1)
Basophils Relative: 0.4 % (ref 0.0–3.0)
Eosinophils Absolute: 0.1 10*3/uL (ref 0.0–0.7)
Eosinophils Relative: 1.3 % (ref 0.0–5.0)
HCT: 37 % (ref 36.0–46.0)
Hemoglobin: 12.4 g/dL (ref 12.0–15.0)
Lymphocytes Relative: 21.9 % (ref 12.0–46.0)
Lymphs Abs: 1.1 10*3/uL (ref 0.7–4.0)
MCHC: 33.6 g/dL (ref 30.0–36.0)
MCV: 90.3 fl (ref 78.0–100.0)
Monocytes Absolute: 0.5 10*3/uL (ref 0.1–1.0)
Monocytes Relative: 9.3 % (ref 3.0–12.0)
Neutro Abs: 3.5 10*3/uL (ref 1.4–7.7)
Neutrophils Relative %: 67.1 % (ref 43.0–77.0)
Platelets: 140 10*3/uL — ABNORMAL LOW (ref 150.0–400.0)
RBC: 4.09 Mil/uL (ref 3.87–5.11)
RDW: 12.8 % (ref 11.5–15.5)
WBC: 5.2 10*3/uL (ref 4.0–10.5)

## 2020-10-21 LAB — COMPREHENSIVE METABOLIC PANEL
ALT: 12 U/L (ref 0–35)
AST: 19 U/L (ref 0–37)
Albumin: 4.5 g/dL (ref 3.5–5.2)
Alkaline Phosphatase: 51 U/L (ref 39–117)
BUN: 28 mg/dL — ABNORMAL HIGH (ref 6–23)
CO2: 29 mEq/L (ref 19–32)
Calcium: 10.6 mg/dL — ABNORMAL HIGH (ref 8.4–10.5)
Chloride: 105 mEq/L (ref 96–112)
Creatinine, Ser: 1.1 mg/dL (ref 0.40–1.20)
GFR: 46.9 mL/min — ABNORMAL LOW (ref >60.00)
Glucose, Bld: 133 mg/dL — ABNORMAL HIGH (ref 70–99)
Potassium: 4.3 mEq/L (ref 3.5–5.1)
Sodium: 140 mEq/L (ref 135–145)
Total Bilirubin: 0.6 mg/dL (ref 0.2–1.2)
Total Protein: 6.7 g/dL (ref 6.0–8.3)

## 2020-10-21 LAB — IRON: Iron: 61 ug/dL (ref 42–145)

## 2020-10-21 LAB — VITAMIN B12: Vitamin B-12: 267 pg/mL (ref 211–911)

## 2020-10-21 LAB — FERRITIN: Ferritin: 37.6 ng/mL (ref 10.0–291.0)

## 2020-10-21 NOTE — Telephone Encounter (Signed)
Labs

## 2020-10-27 ENCOUNTER — Other Ambulatory Visit: Payer: Self-pay

## 2020-10-27 ENCOUNTER — Ambulatory Visit (INDEPENDENT_AMBULATORY_CARE_PROVIDER_SITE_OTHER): Payer: Medicare Other | Admitting: Nurse Practitioner

## 2020-10-27 ENCOUNTER — Encounter: Payer: Self-pay | Admitting: Nurse Practitioner

## 2020-10-27 VITALS — BP 112/70 | HR 67 | Ht 63.0 in | Wt 125.0 lb

## 2020-10-27 DIAGNOSIS — I10 Essential (primary) hypertension: Secondary | ICD-10-CM | POA: Diagnosis not present

## 2020-10-27 DIAGNOSIS — E1122 Type 2 diabetes mellitus with diabetic chronic kidney disease: Secondary | ICD-10-CM

## 2020-10-27 DIAGNOSIS — N182 Chronic kidney disease, stage 2 (mild): Secondary | ICD-10-CM | POA: Diagnosis not present

## 2020-10-27 DIAGNOSIS — E782 Mixed hyperlipidemia: Secondary | ICD-10-CM

## 2020-10-27 DIAGNOSIS — E559 Vitamin D deficiency, unspecified: Secondary | ICD-10-CM

## 2020-10-27 LAB — POCT GLYCOSYLATED HEMOGLOBIN (HGB A1C): HbA1c, POC (controlled diabetic range): 6.5 % (ref 0.0–7.0)

## 2020-10-27 NOTE — Patient Instructions (Signed)

## 2020-10-27 NOTE — Progress Notes (Signed)
10/27/2020, 2:54 PM  Endocrinology follow-up note   Subjective:    Patient ID: Tracey [name], female    DOB: Jan 29, 1936.  Tracey [name] is being seen  in follow-up after she was seen in consultation for management of currently uncontrolled symptomatic diabetes requested by  Abner Greenspan, MD.   Past Medical History:  Diagnosis Date  . Anxiety   . Arthritis   . Atrial fibrillation ([place])    history  . Chronic kidney disease    ckd stage II  . Coronary artery disease   . Diabetes mellitus    non insulin dependent  . Dysrhythmia    atrial fibrillation, history of  . GERD (gastroesophageal reflux disease)   . Heart murmur   . History of kidney stones   . Hyperlipidemia   . Hypertension   . Peripheral vascular disease ([place])   . Rectocele   . Syncope 09/2019  . UTI (urinary tract infection)     Past Surgical History:  Procedure Laterality Date  . ABDOMINAL AORTA STENT    . APPENDECTOMY    . BREAST BIOPSY Left    20 years ago  . BREAST BIOPSY Right 2008  . BREAST EXCISIONAL BIOPSY     rt 2008 lt "20 years ago"  . CARDIAC CATHETERIZATION    . CATARACT EXTRACTION W/PHACO Left 08/15/2015   Procedure: CATARACT EXTRACTION PHACO AND INTRAOCULAR LENS PLACEMENT (IOC);  Surgeon: Estill Cotta, MD;  Location: [facility] ORS;  Service: Ophthalmology;  Laterality: Left;  [place] 01:28 AP% 26.4 CDE 35.73 fluid pack # [id number]  . COLONOSCOPY WITH PROPOFOL N/A 01/16/2016   Procedure: COLONOSCOPY WITH PROPOFOL;  Surgeon: Manya Silvas, MD;  Location: [facility] ENDOSCOPY;  Service: Endoscopy;  Laterality: N/A;  . CORONARY ANGIOPLASTY WITH STENT PLACEMENT    . CORONARY ARTERY BYPASS GRAFT  1995   with LIMA  to the LAD, SVG to OM1, SVG to PDA  . ESOPHAGOGASTRODUODENOSCOPY (EGD) WITH PROPOFOL N/A 01/16/2016   Procedure: ESOPHAGOGASTRODUODENOSCOPY (EGD) WITH PROPOFOL;  Surgeon: Manya Silvas, MD;  Location:  [facility] ENDOSCOPY;  Service: Endoscopy;  Laterality: N/A;  . EYE SURGERY    . JOINT REPLACEMENT Right   . KNEE ARTHROPLASTY Left 01/06/2020   Procedure: COMPUTER ASSISTED TOTAL KNEE ARTHROPLASTY;  Surgeon: Dereck Leep, MD;  Location: [facility] ORS;  Service: Orthopedics;  Laterality: Left;  . LOWER EXTREMITY ANGIOGRAPHY N/A 07/08/2019   Procedure: Abdominal Aortagraphy;  Surgeon: Algernon Huxley, MD;  Location: [place] [facility];  Service: Cardiovascular;  Laterality: N/A;  . RENAL ANGIOGRAPHY Left 08/26/2019   Procedure: RENAL ANGIOGRAPHY;  Surgeon: Algernon Huxley, MD;  Location: [place] [facility];  Service: Cardiovascular;  Laterality: Left;  . RENAL ARTERY STENT     left, By Dr Hulda Humphrey  . REPLACEMENT TOTAL KNEE  03/25/2013   right  . TONSILLECTOMY      Social History   Socioeconomic History  . Marital status: Widowed    Spouse name: Not on file  . Number of children: Not on file  . Years of education: Not on file  . Highest education level: Not  on file  Occupational History  . Occupation: school cafeteria    Comment: retired  Tobacco Use  . Smoking status: Never Smoker  . Smokeless tobacco: Never Used  Vaping Use  . Vaping Use: Never used  Substance and Sexual Activity  . Alcohol use: No  . Drug use: No  . Sexual activity: Never  Other Topics Concern  . Not on file  Social History Narrative   Patient has 3 daughters and a significant other. Plans on going home after surgery.   They have a plan to help the patient out once home!!   Social Determinants of Health   Financial Resource Strain: Not on file  Food Insecurity: Not on file  Transportation Needs: Not on file  Physical Activity: Not on file  Stress: Not on file  Social Connections: Not on file    Family History  Problem Relation Age of Onset  . Heart disease Father   . Other Brother        open heart surgery  . Cancer Brother   . Other Brother        open heart surgery  . Other Brother        open heart  surgery  . Hyperlipidemia Daughter   . Breast cancer Neg Hx   . Kidney cancer Neg Hx   . Bladder Cancer Neg Hx     Outpatient Encounter Medications as of 10/27/2020  Medication Sig  . acetaminophen (TYLENOL) 500 MG tablet Take 500 mg by mouth in the morning and at bedtime.  . carvedilol (COREG) 3.125 MG tablet Take 1 tablet (3.125 mg total) by mouth 2 (two) times daily.  . clopidogrel (PLAVIX) 75 MG tablet Take 1 tablet (75 mg total) by mouth daily.  Marland Kitchen CRANBERRY PO Take 1 tablet by mouth daily.  . Cyanocobalamin (VITAMIN B-12 IJ) Inject as directed See admin instructions. Every two months  . glipiZIDE (GLUCOTROL XL) 2.5 MG 24 hr tablet TAKE 1 TABLET BY MOUTH EVERY DAY WITH BREAKFAST  . glucose blood (ACCU-CHEK GUIDE) test strip Use as instructed to check blood glucose 1-2 times daily.  . Lancets MISC 1 each by Does not apply route 2 (two) times daily.  Marland Kitchen lisinopril (ZESTRIL) 10 MG tablet Take 1 tablet (10 mg total) by mouth daily. (Patient taking differently: Take 10 mg by mouth at bedtime.)  . metFORMIN (GLUCOPHAGE-XR) 500 MG 24 hr tablet TAKE 1 TABLET BY MOUTH EVERY DAY AFTER BREAKFAST  . Multiple Vitamins-Minerals (MULTIVITAMIN WITH MINERALS) tablet Take 1 tablet by mouth daily.  . nitroGLYCERIN (NITROSTAT) 0.4 MG SL tablet PLACE 1 TABLET (0.4 MG TOTAL) UNDER THE TONGUE EVERY 5 (FIVE) MINUTES AS NEEDED FOR CHEST PAIN.  [name] Lancets 78M MISC USE TO TEST TWICE A DAY  . pantoprazole (PROTONIX) 40 MG tablet TAKE 1 TABLET BY MOUTH EVERY DAY (Patient taking differently: Take 20 mg by mouth daily.)  . Probiotic Product (ALIGN) 4 MG CAPS Take 1 capsule by mouth daily.  . rosuvastatin (CRESTOR) 40 MG tablet Take 1 tablet (40 mg total) by mouth daily. (Patient taking differently: Take 40 mg by mouth at bedtime.)  . [DISCONTINUED] gabapentin (NEURONTIN) 300 MG capsule TAKE 1 CAPSULE BY MOUTH EVERYDAY AT BEDTIME   No facility-administered encounter medications on file as of 10/27/2020.     ALLERGIES: Allergies  Allergen Reactions  . Codeine Nausea Only    Sick to her stomach  . Erythromycin Nausea And Vomiting  . Tramadol Nausea Only and Other (See Comments)  Loss of appetite and did not eat  . Ciprofloxacin Diarrhea    Upsets stomach    VACCINATION STATUS: Immunization History  Administered Date(s) Administered  . Fluad Quad(high Dose 65+) 06/15/2019  . Influenza Split 08/10/2015  . Influenza,inj,Quad PF,6+ Mos 05/24/2016, 07/26/2017, 06/03/2018  . Influenza-Unspecified 08/10/2015, 05/24/2016  . Pneumococcal Conjugate-13 11/05/2016  . Pneumococcal Polysaccharide-23 12/11/2017  . Td 03/17/2013    Diabetes She presents for her follow-up diabetic visit. She has type 2 diabetes mellitus. Onset time: She was diagnosed at approximate age of 65 years. Her disease course has been stable. There are no hypoglycemic associated symptoms. Pertinent negatives for hypoglycemia include no confusion, headaches, pallor or seizures. Pertinent negatives for diabetes include no chest pain, no fatigue, no polydipsia, no polyphagia and no polyuria. There are no hypoglycemic complications. Symptoms are stable. Diabetic complications include heart disease, nephropathy and PVD. Risk factors for coronary artery disease include dyslipidemia, diabetes mellitus, hypertension, sedentary lifestyle and post-menopausal. Current diabetic treatment includes oral agent (dual therapy). She is compliant with treatment all of the time. Her weight is fluctuating minimally. She is following a generally healthy diet. Meal planning includes avoidance of concentrated sweets. She rarely participates in exercise. Her home blood glucose trend is fluctuating minimally. Her breakfast blood glucose range is generally 110-130 mg/dl. (She presents today with her meter and logs showing stable glycemic profile.  Her POCT A1c today is 6.5%, improving from last visit of 6.7%.  She denies any hypoglycemia.) An ACE  inhibitor/angiotensin II receptor blocker is being taken. She does not see a podiatrist.Eye exam is current.  Hyperlipidemia This is a chronic problem. The current episode started more than 1 year ago. The problem is controlled. Recent lipid tests were reviewed and are normal. Exacerbating diseases include chronic renal disease and diabetes. Factors aggravating her hyperlipidemia include beta blockers. Pertinent negatives include no chest pain, myalgias or shortness of breath. Current antihyperlipidemic treatment includes statins. The current treatment provides moderate improvement of lipids. There are no compliance problems.  Risk factors for coronary artery disease include dyslipidemia, diabetes mellitus, hypertension, post-menopausal and a sedentary lifestyle.  Hypertension This is a chronic problem. The current episode started more than 1 year ago. The problem has been gradually improving since onset. The problem is controlled. Pertinent negatives include no chest pain, headaches, palpitations or shortness of breath. There are no associated agents to hypertension. Risk factors for coronary artery disease include diabetes mellitus, dyslipidemia, sedentary lifestyle and post-menopausal state. Past treatments include beta blockers and ACE inhibitors. The current treatment provides moderate improvement. There are no compliance problems.  Hypertensive end-organ damage includes kidney disease, CAD/MI and PVD. Identifiable causes of hypertension include chronic renal disease.    Review of systems  Constitutional: + Minimally fluctuating body weight,  current Body mass index is 22.14 kg/m. , no fatigue, no subjective hyperthermia, no subjective hypothermia Eyes: no blurry vision, no xerophthalmia ENT: no sore throat, no nodules palpated in throat, no dysphagia/odynophagia, no hoarseness Cardiovascular: no chest pain, no shortness of breath, no palpitations, no leg swelling Respiratory: no cough, no  shortness of breath Gastrointestinal: no nausea/vomiting/diarrhea Musculoskeletal: no muscle/joint aches Skin: no rashes, no hyperemia Neurological: no tremors, no numbness, no tingling, no dizziness Psychiatric: no depression, no anxiety   Objective:    BP 112/70 (BP Location: Left Arm, Patient Position: Sitting)   Pulse 67   Ht 5\' 3"  (1.6 m)   Wt 125 lb (56.7 kg)   BMI 22.14 kg/m   Wt Readings from Last  3 Encounters:  10/27/20 125 lb (56.7 kg)  06/24/20 127 lb 6.4 oz (57.8 kg)  03/11/20 126 lb 9.6 oz (57.4 kg)    BP Readings from Last 3 Encounters:  10/27/20 112/70  06/24/20 126/68  03/11/20 (!) 133/57     Physical Exam- Limited  Constitutional:  Body mass index is 22.14 kg/m. , not in acute distress, normal state of mind Eyes:  EOMI, no exophthalmos Neck: Supple Cardiovascular: RRR, no murmers, rubs, or gallops, no edema Respiratory: Adequate breathing efforts, no crackles, rales, rhonchi, or wheezing Musculoskeletal: no gross deformities, strength intact in all four extremities, no gross restriction of joint movements Skin:  no rashes, no hyperemia Neurological: no tremor with outstretched hands   CMP ( most recent) CMP     Component Value Date/Time   NA 140 10/21/2020 1022   NA 137 03/27/2013 0545   K 4.3 10/21/2020 1022   K 3.9 03/27/2013 0545   CL 105 10/21/2020 1022   CL 103 03/27/2013 0545   CO2 29 10/21/2020 1022   CO2 27 03/27/2013 0545   GLUCOSE 133 (H) 10/21/2020 1022   GLUCOSE 122 (H) 03/27/2013 0545   BUN 28 (H) 10/21/2020 1022   BUN 10 03/27/2013 0545   CREATININE 1.10 10/21/2020 1022   CREATININE 0.98 (H) 03/03/2020 0000   CALCIUM 10.6 (H) 10/21/2020 1022   CALCIUM 8.9 03/27/2013 0545   PROT 6.7 10/21/2020 1022   PROT 7.6 10/07/2011 1316   ALBUMIN 4.5 10/21/2020 1022   ALBUMIN 4.5 10/07/2011 1316   AST 19 10/21/2020 1022   AST 26 10/07/2011 1316   ALT 12 10/21/2020 1022   ALT 26 10/07/2011 1316   ALKPHOS 51 10/21/2020 1022    ALKPHOS 46 (L) 10/07/2011 1316   BILITOT 0.6 10/21/2020 1022   BILITOT 0.5 10/07/2011 1316   GFRNONAA 54 (L) 03/03/2020 0000   GFRAA 63 03/03/2020 0000     Diabetic Labs (most recent): Lab Results  Component Value Date   HGBA1C 6.5 10/27/2020   HGBA1C 6.7 (A) 06/24/2020   HGBA1C 6.8 (H) 12/29/2019     Lipid Panel ( most recent) Lipid Panel     Component Value Date/Time   CHOL 144 03/03/2020 0000   CHOL 141 10/08/2011 0457   TRIG 127 03/03/2020 0000   TRIG 179 10/08/2011 0457   HDL 46 (L) 03/03/2020 0000   HDL 37 (L) 10/08/2011 0457   CHOLHDL 3.1 03/03/2020 0000   VLDL 29.0 03/05/2019 0916   VLDL 36 10/08/2011 0457   LDLCALC 77 03/03/2020 0000   LDLCALC 68 10/08/2011 0457      Lab Results  Component Value Date   TSH 1.66 03/03/2020   TSH 2.68 03/05/2019   TSH 2.96 11/06/2017   TSH 1.24 02/01/2017   TSH 1.86 02/06/2016   FREET4 1.4 03/03/2020      Assessment & Plan:   1) Type 2 diabetes complicated by stage 2 CKD  - Tracey [name] has currently controlled asymptomatic type 2 DM since  85 years of age.  She presents today with her meter and logs showing stable glycemic profile.  Her POCT A1c today is 6.5%, improving from last visit of 6.7%.  She denies any hypoglycemia.  - I had a long discussion with her about the progressive nature of diabetes and the pathology behind its complications. -her diabetes is complicated by stage 2 CKD and she remains at a high risk for more acute and chronic complications which include CAD, CVA, CKD, retinopathy, and neuropathy.  These are all discussed in detail with her.  - Nutritional counseling repeated at each appointment due to patients tendency to fall back in to old habits.  - The patient admits there is a room for improvement in their diet and drink choices. -  Suggestion is made for the patient to avoid simple carbohydrates from their diet including Cakes, Sweet Desserts / Pastries, Ice Cream, Soda (diet and regular),  Sweet Tea, Candies, Chips, Cookies, Sweet Pastries,  Store Bought Juices, Alcohol in Excess of  1-2 drinks a day, Artificial Sweeteners, Coffee Creamer, and "Sugar-free" Products. This will help patient to have stable blood glucose profile and potentially avoid unintended weight gain.   - I encouraged the patient to switch to  unprocessed or minimally processed complex starch and increased protein intake (animal or plant source), fruits, and vegetables.   - Patient is advised to stick to a routine mealtimes to eat 3 meals  a day and avoid unnecessary snacks ( to snack only to correct hypoglycemia).  - I have approached her with the following individualized plan to manage  her diabetes and patient agrees:   -Given her control of diabetes, she can continue with current medication plan.  -She is advised to continue Metformin 500 mg XR po daily with breakfast and Glipizide 2.5 mg XL PO daily with breakfast.    -She is encouraged to continue monitoring blood glucose at least once daily, before breakfast and call the clinic if she gets readings less than 70 or greater than 200 for 3 tests in a row.  - she is not a candidate for SGLT2 inhibitors due to concurrent renal insufficiency.  - she is not a candidate for incretin therapy due to her body habitus.    - Patient specific target  A1c;  LDL, HDL, Triglycerides, and  Waist Circumference were discussed in detail.  2) Blood Pressure /Hypertension:  Her blood pressure is controlled to target.  She is advised to continue Coreg 3.125 mg po twice daily and Lisinopril 10 mg po daily.   3) Lipids/Hyperlipidemia: Her most recent lipid panel from 03/03/20 shows controlled LDL at 77.  She is advised to continue Crestor 40 mg po daily at bedtime.  Side effects and precautions discussed with her.  Will recheck lipids prior to next visit.  4)  Weight/Diet: Her Body mass index is 22.14 kg/m.  -She is not a candidate for weight loss.  Exercise, and detailed  carbohydrates information provided  -  detailed on discharge instructions.  5) Chronic Care/Health Maintenance: -she is on ACEI/ARB and Statin medications and  is encouraged to initiate and continue to follow up with Ophthalmology, Dentist,  Podiatrist at least yearly or according to recommendations, and advised to   stay away from smoking. I have recommended yearly flu vaccine and pneumonia vaccine at least every 5 years; moderate intensity exercise for up to 150 minutes weekly; and  sleep for at least 7 hours a day.  - she is advised to maintain close follow up with [facility], Wynelle Fanny, MD for primary care needs, as well as her other providers for optimal and coordinated care.  - Time spent on this patient care encounter:  35 min, of which > 50% was spent in counseling and the rest reviewing her blood glucose logs, discussing her hypoglycemia and hyperglycemia episodes, reviewing her current and  previous labs/studies (including abstraction from other facilities) and medications doses and developing a long term treatment plan and documenting her care.   Please refer  to "Patient Instructions for Blood Glucose Monitoring and Insulin/Medications Dosing Guide" in media tab for additional information. Please also refer to "Patient Self Inventory" in the Media  tab for reviewed elements of pertinent patient history.  Tracey [name] participated in the discussions, expressed understanding, and voiced agreement with the above plans.  All questions were answered to her satisfaction. she is encouraged to contact clinic should she have any questions or concerns prior to her return visit.    Follow up plan: - Return in about 4 months (around 02/24/2021) for Diabetes follow up- A1c and urine micro in office, Previsit labs, Bring glucometer and logs.  Rayetta Pigg, [facility] [address] [place], [place] [address] Phone: [phone] Fax: [phone]  10/27/2020, 2:54  PM

## 2020-11-03 ENCOUNTER — Ambulatory Visit (INDEPENDENT_AMBULATORY_CARE_PROVIDER_SITE_OTHER): Payer: Medicare Other | Admitting: Obstetrics and Gynecology

## 2020-11-03 ENCOUNTER — Other Ambulatory Visit: Payer: Self-pay

## 2020-11-03 ENCOUNTER — Encounter: Payer: Self-pay | Admitting: Obstetrics and Gynecology

## 2020-11-03 VITALS — BP 159/66 | HR 60 | Ht 63.0 in | Wt 125.8 lb

## 2020-11-03 DIAGNOSIS — M8580 Other specified disorders of bone density and structure, unspecified site: Secondary | ICD-10-CM

## 2020-11-03 DIAGNOSIS — N952 Postmenopausal atrophic vaginitis: Secondary | ICD-10-CM

## 2020-11-03 DIAGNOSIS — R399 Unspecified symptoms and signs involving the genitourinary system: Secondary | ICD-10-CM | POA: Diagnosis not present

## 2020-11-03 DIAGNOSIS — Z01419 Encounter for gynecological examination (general) (routine) without abnormal findings: Secondary | ICD-10-CM | POA: Diagnosis not present

## 2020-11-03 LAB — POCT URINALYSIS DIPSTICK
Bilirubin, UA: NEGATIVE
Blood, UA: NEGATIVE
Glucose, UA: NEGATIVE
Ketones, UA: NEGATIVE
Nitrite, UA: NEGATIVE
Protein, UA: POSITIVE — AB
Spec Grav, UA: 1.025 (ref 1.010–1.025)
Urobilinogen, UA: 0.2 E.U./dL
pH, UA: 6 (ref 5.0–8.0)

## 2020-11-03 NOTE — Progress Notes (Signed)
Pt present for well care check. Pt stated stated having urine odor and dark urine. UA completed.

## 2020-11-03 NOTE — Patient Instructions (Signed)

## 2020-11-03 NOTE — Progress Notes (Signed)
ANNUAL PREVENTATIVE CARE GYNECOLOGY  ENCOUNTER NOTE  Subjective:       Tracey [name] is a 85 y.o. G56P4 female here for a routine annual gynecologic exam. The patient is not sexually active. The patient is not taking hormone replacement therapy. Patient denies post-menopausal vaginal bleeding.   Of note, Tracey [name] wishes to inform that she recently got married again.  Enjoys spending time with her new husband.    She report the following complaints today: 1. She reports that her urine has an odor and was darker than usual since yesterday. Denies dysuria, hematuria, fevers or chills.   Gynecologic History No LMP recorded. Patient is postmenopausal. Last Pap: no longer needed.  Last mammogram: 12/09/2017. Results were: normal Last Colonoscopy: 01/16/2016. Diverticula throughout the colon. 4 mm polyp in descending colon, internal hemorrhoids.  Last Dexa Scan: 02/05/2017. Results were: osteopenia (T score -1.9)   Obstetric History OB History  Gravida Para Term Preterm AB Living  4 4          SAB IAB Ectopic Multiple Live Births               # Outcome Date GA Lbr Len/2nd Weight Sex Delivery Anes PTL Lv  4 Para 85    M Vag-Spont     3 Para 1960    F Vag-Spont     2 Para 1957    F Vag-Spont     1 Para 1956    F Vag-Spont       Past Medical History:  Diagnosis Date  . Anxiety   . Arthritis   . Atrial fibrillation ([place])    history  . Chronic kidney disease    ckd stage II  . Coronary artery disease   . Diabetes mellitus    non insulin dependent  . Dysrhythmia    atrial fibrillation, history of  . GERD (gastroesophageal reflux disease)   . Heart murmur   . History of kidney stones   . Hyperlipidemia   . Hypertension   . Peripheral vascular disease ([place])   . Rectocele   . Syncope 09/2019  . UTI (urinary tract infection)     Family History  Problem Relation Age of Onset  . Heart disease Father   . Other Brother        open heart surgery  . Cancer Brother   . Other  Brother        open heart surgery  . Other Brother        open heart surgery  . Hyperlipidemia Daughter   . Breast cancer Neg Hx   . Kidney cancer Neg Hx   . Bladder Cancer Neg Hx     Past Surgical History:  Procedure Laterality Date  . ABDOMINAL AORTA STENT    . APPENDECTOMY    . BREAST BIOPSY Left    20 years ago  . BREAST BIOPSY Right 2008  . BREAST EXCISIONAL BIOPSY     rt 2008 lt "20 years ago"  . CARDIAC CATHETERIZATION    . CATARACT EXTRACTION W/PHACO Left 08/15/2015   Procedure: CATARACT EXTRACTION PHACO AND INTRAOCULAR LENS PLACEMENT (IOC);  Surgeon: Estill Cotta, MD;  Location: [facility] ORS;  Service: Ophthalmology;  Laterality: Left;  [place] 01:28 AP% 26.4 CDE 35.73 fluid pack # [id number]  . COLONOSCOPY WITH PROPOFOL N/A 01/16/2016   Procedure: COLONOSCOPY WITH PROPOFOL;  Surgeon: Manya Silvas, MD;  Location: [facility] ENDOSCOPY;  Service: Endoscopy;  Laterality: N/A;  . CORONARY ANGIOPLASTY WITH STENT PLACEMENT    .  CORONARY ARTERY BYPASS GRAFT  1995   with LIMA  to the LAD, SVG to OM1, SVG to PDA  . ESOPHAGOGASTRODUODENOSCOPY (EGD) WITH PROPOFOL N/A 01/16/2016   Procedure: ESOPHAGOGASTRODUODENOSCOPY (EGD) WITH PROPOFOL;  Surgeon: Manya Silvas, MD;  Location: [facility] ENDOSCOPY;  Service: Endoscopy;  Laterality: N/A;  . EYE SURGERY    . JOINT REPLACEMENT Right   . KNEE ARTHROPLASTY Left 01/06/2020   Procedure: COMPUTER ASSISTED TOTAL KNEE ARTHROPLASTY;  Surgeon: Dereck Leep, MD;  Location: [facility] ORS;  Service: Orthopedics;  Laterality: Left;  . LOWER EXTREMITY ANGIOGRAPHY N/A 07/08/2019   Procedure: Abdominal Aortagraphy;  Surgeon: Algernon Huxley, MD;  Location: [place] CV LAB;  Service: Cardiovascular;  Laterality: N/A;  . RENAL ANGIOGRAPHY Left 08/26/2019   Procedure: RENAL ANGIOGRAPHY;  Surgeon: Algernon Huxley, MD;  Location: [place] CV LAB;  Service: Cardiovascular;  Laterality: Left;  . RENAL ARTERY STENT     left, By Dr Hulda Humphrey  . REPLACEMENT TOTAL KNEE   03/25/2013   right  . TONSILLECTOMY      Social History   Socioeconomic History  . Marital status: Widowed    Spouse name: Not on file  . Number of children: Not on file  . Years of education: Not on file  . Highest education level: Not on file  Occupational History  . Occupation: school cafeteria    Comment: retired  Tobacco Use  . Smoking status: Never Smoker  . Smokeless tobacco: Never Used  Vaping Use  . Vaping Use: Never used  Substance and Sexual Activity  . Alcohol use: No  . Drug use: No  . Sexual activity: Never  Other Topics Concern  . Not on file  Social History Narrative   Patient has 3 daughters and a significant other. Plans on going home after surgery.   They have a plan to help the patient out once home!!   Social Determinants of Health   Financial Resource Strain: Not on file  Food Insecurity: Not on file  Transportation Needs: Not on file  Physical Activity: Not on file  Stress: Not on file  Social Connections: Not on file  Intimate Partner Violence: Not on file    Current Outpatient Medications on File Prior to Visit  Medication Sig Dispense Refill  . acetaminophen (TYLENOL) 500 MG tablet Take 500 mg by mouth in the morning and at bedtime.    . carvedilol (COREG) 3.125 MG tablet Take 1 tablet (3.125 mg total) by mouth 2 (two) times daily. 180 tablet 3  . clopidogrel (PLAVIX) 75 MG tablet Take 1 tablet (75 mg total) by mouth daily. 90 tablet 3  . CRANBERRY PO Take 1 tablet by mouth daily.    . Cyanocobalamin (VITAMIN B-12 IJ) Inject as directed See admin instructions. Every two months    . glipiZIDE (GLUCOTROL XL) 2.5 MG 24 hr tablet TAKE 1 TABLET BY MOUTH EVERY DAY WITH BREAKFAST 90 tablet 0  . glucose blood (ACCU-CHEK GUIDE) test strip Use as instructed to check blood glucose 1-2 times daily. 100 each 12  . Lancets MISC 1 each by Does not apply route 2 (two) times daily. 100 each 12  . lisinopril (ZESTRIL) 10 MG tablet Take 1 tablet (10 mg total)  by mouth daily. (Patient taking differently: Take 10 mg by mouth at bedtime.) 90 tablet 3  . metFORMIN (GLUCOPHAGE-XR) 500 MG 24 hr tablet TAKE 1 TABLET BY MOUTH EVERY DAY AFTER BREAKFAST 90 tablet 0  . Multiple Vitamins-Minerals (MULTIVITAMIN WITH  MINERALS) tablet Take 1 tablet by mouth daily.    . nitroGLYCERIN (NITROSTAT) 0.4 MG SL tablet PLACE 1 TABLET (0.4 MG TOTAL) UNDER THE TONGUE EVERY 5 (FIVE) MINUTES AS NEEDED FOR CHEST PAIN. 25 tablet 0  . OneTouch Delica Lancets 70W MISC USE TO TEST TWICE A DAY 100 each 1  . pantoprazole (PROTONIX) 40 MG tablet TAKE 1 TABLET BY MOUTH EVERY DAY (Patient taking differently: Take 20 mg by mouth daily.) 90 tablet 1  . Probiotic Product (ALIGN) 4 MG CAPS Take 1 capsule by mouth daily.    . rosuvastatin (CRESTOR) 40 MG tablet Take 1 tablet (40 mg total) by mouth daily. (Patient taking differently: Take 40 mg by mouth at bedtime.) 90 tablet 3   No current facility-administered medications on file prior to visit.    Allergies  Allergen Reactions  . Codeine Nausea Only    Sick to her stomach  . Erythromycin Nausea And Vomiting  . Tramadol Nausea Only and Other (See Comments)    Loss of appetite and did not eat  . Ciprofloxacin Diarrhea    Upsets stomach      Review of Systems ROS Review of Systems - General ROS: negative for - chills, fatigue, fever, hot flashes, night sweats, weight gain or weight loss Psychological ROS: negative for - anxiety, decreased libido, depression, mood swings, physical abuse or sexual abuse Ophthalmic ROS: negative for - blurry vision, eye pain or loss of vision ENT ROS: negative for - headaches, hearing change, visual changes or vocal changes Allergy and Immunology ROS: negative for - hives, itchy/watery eyes or seasonal allergies Hematological and Lymphatic ROS: negative for - bleeding problems, bruising, swollen lymph nodes or weight loss Endocrine ROS: negative for - galactorrhea, hair pattern changes, hot flashes,  malaise/lethargy, mood swings, palpitations, polydipsia/polyuria, skin changes, temperature intolerance or unexpected weight changes Breast ROS: negative for - new or changing breast lumps or nipple discharge Respiratory ROS: negative for - cough or shortness of breath Cardiovascular ROS: negative for - chest pain, irregular heartbeat, palpitations or shortness of breath Gastrointestinal ROS: no abdominal pain, change in bowel habits, or black or bloody stools Genito-Urinary ROS: no dysuria, trouble voiding, or hematuria. Positive for urinary odor and change in color.  Musculoskeletal ROS: negative for - joint pain or joint stiffness Neurological ROS: negative for - bowel and bladder control changes Dermatological ROS: negative for rash and skin lesion changes   Objective:   BP (!) 159/66   Pulse 60   Ht 5\' 3"  (1.6 m)   Wt 125 lb 12.8 oz (57.1 kg)   BMI 22.28 kg/m  CONSTITUTIONAL: Well-developed, well-nourished female in no acute distress.  PSYCHIATRIC: Normal mood and affect. Normal behavior. Normal judgment and thought content. [place]: Alert and oriented to person, place, and time. Normal muscle tone coordination. No cranial nerve deficit noted. HENT:  Normocephalic, atraumatic, External right and left ear normal. Oropharynx is clear and moist EYES: Conjunctivae and EOM are normal. Pupils are equal, round, and reactive to light. No scleral icterus.  NECK: Normal range of motion, supple, no masses.  Normal thyroid.  SKIN: Skin is warm and dry. No rash noted. Not diaphoretic. No erythema. No pallor. CARDIOVASCULAR: Normal heart rate noted, regular rhythm, no murmur. RESPIRATORY: Clear to auscultation bilaterally. Effort and breath sounds normal, no problems with respiration noted. BREASTS: Symmetric in size. No masses, skin changes, nipple drainage, or lymphadenopathy. ABDOMEN: Soft, normal bowel sounds, no distention noted.  No tenderness, rebound or guarding.  BLADDER:  Normal  PELVIC:  Bladder no bladder distension noted  Urethra: normal appearing urethra with no masses, tenderness or lesions  Vulva: normal appearing vulva with no masses, tenderness or lesions  Vagina: atrophic vagina without discharge or lesions.   Cervix: mildly atrophic cervix without discharge or lesions  Uterus: uterus is normal size, shape, consistency and nontender  Adnexa: normal adnexa in size, nontender and no masses  RV: External Exam NormaI, No Rectal Masses and Normal Sphincter tone  MUSCULOSKELETAL: Normal range of motion. No tenderness.  No cyanosis, clubbing, or edema.  2+ distal pulses. LYMPHATIC: No Axillary, Supraclavicular, or Inguinal Adenopathy.   Labs: Lab Results  Component Value Date   WBC 5.2 10/21/2020   HGB 12.4 10/21/2020   HCT 37.0 10/21/2020   MCV 90.3 10/21/2020   PLT 140.0 (L) 10/21/2020    Lab Results  Component Value Date   CREATININE 1.10 10/21/2020   BUN 28 (H) 10/21/2020   NA 140 10/21/2020   K 4.3 10/21/2020   CL 105 10/21/2020   CO2 29 10/21/2020    Lab Results  Component Value Date   ALT 12 10/21/2020   AST 19 10/21/2020   ALKPHOS 51 10/21/2020   BILITOT 0.6 10/21/2020    Lab Results  Component Value Date   CHOL 144 03/03/2020   HDL 46 (L) 03/03/2020   LDLCALC 77 03/03/2020   TRIG 127 03/03/2020   CHOLHDL 3.1 03/03/2020    Lab Results  Component Value Date   TSH 1.66 03/03/2020    Lab Results  Component Value Date   HGBA1C 6.5 10/27/2020     Results for orders placed or performed in visit on 11/03/20  POCT urinalysis dipstick  Result Value Ref Range   Color, UA a[place]    Clarity, UA cloudy    Glucose, UA Negative Negative   Bilirubin, UA neg    Ketones, UA neg    Spec Grav, UA 1.025 1.010 - 1.025   Blood, UA neg    pH, UA 6.0 5.0 - 8.0   Protein, UA Positive (A) Negative   Urobilinogen, UA 0.2 0.2 or 1.0 E.U./dL   Nitrite, UA neg    Leukocytes, UA Moderate (2+) (A) Negative   Appearance  auburn;cloudy    Odor       Assessment:   1. Encounter for routine gynecologic examination in Medicare patient   2. UTI symptoms   3. Vaginal atrophy   4. Osteopenia, unspecified location    Plan:  Pap: Not needed. Patient beyond age-based screening Mammogram: Not Indicated. Patient beyond age-based screening.  Stool Guaiac Testing:  Not Indicated. Last colonoscopy in 2017. Does not think she is required to have any more.  Labs: Has labs performed by PCP.  Routine preventative health maintenance measures emphasized: Exercise/Diet/Weight control, Tobacco Warnings, Alcohol/Substance use risks, Stress Management and Safe Sex COVID Vaccination status: Has completed COVID vaccinations and booster.  Flu vaccination status: up to date.  Osteopenia: encourage intake of Vitamin D and calcium Vaginal atrophy not bothersome to patient. No treatment indicated.  Urinary symptoms, UA today with no signs of UTI. Encouraged to continue use of Azo and aggressive hydration.  Return to [place] as needed   Rubie Maid, MD

## 2020-11-04 ENCOUNTER — Encounter: Payer: Self-pay | Admitting: Obstetrics and Gynecology

## 2020-11-06 ENCOUNTER — Other Ambulatory Visit: Payer: Self-pay | Admitting: "Endocrinology

## 2020-11-08 ENCOUNTER — Other Ambulatory Visit: Payer: Self-pay | Admitting: Nurse Practitioner

## 2020-11-10 ENCOUNTER — Ambulatory Visit: Payer: Medicare Other | Admitting: Physician Assistant

## 2020-11-10 ENCOUNTER — Other Ambulatory Visit: Payer: Self-pay | Admitting: Cardiovascular Disease

## 2020-11-11 ENCOUNTER — Telehealth: Payer: Self-pay | Admitting: Cardiovascular Disease

## 2020-11-11 MED ORDER — CLOPIDOGREL BISULFATE 75 MG PO TABS: 75.0000 mg | ORAL_TABLET | Freq: Every day | ORAL | 0 refills | Status: DC

## 2020-11-11 NOTE — Telephone Encounter (Signed)
Requested Prescriptions   Signed Prescriptions Disp Refills   clopidogrel (PLAVIX) 75 MG tablet 90 tablet 0    Sig: Take 1 tablet (75 mg total) by mouth daily.    Authorizing Provider: Minna Merritts    Ordering User: Raelene Bott, Mandell Pangborn L   90 day refill of rosuvastatin already sent to [facility].

## 2020-11-11 NOTE — Telephone Encounter (Signed)
*  STAT* If patient is at the pharmacy, call can be transferred to refill team.   1. Which medications need to be refilled? (please list name of each medication and dose if known)   Rosuvastatin 40 mg po qhs Clopidogrel 75 mg po q d   2. Which pharmacy/location (including street and city if local pharmacy) is medication to be sent to?   [facility] in [place] (NEW PHARMACY)     3. Do they need a 30 day or 90 day supply? [place]

## 2020-11-14 MED ORDER — CLOPIDOGREL BISULFATE 75 MG PO TABS: 75.0000 mg | ORAL_TABLET | Freq: Every day | ORAL | 0 refills | Status: DC

## 2020-11-14 MED ORDER — ROSUVASTATIN CALCIUM 40 MG PO TABS: 40.0000 mg | ORAL_TABLET | Freq: Every day | ORAL | 0 refills | Status: DC

## 2020-11-14 NOTE — Telephone Encounter (Signed)
Pharmacy did not receive.  Please resend plavix and crestor

## 2020-11-14 NOTE — Addendum Note (Signed)
Addended by: Raelene Bott, Cristyn Crossno L on: 11/14/2020 02:02 PM   Modules accepted: Orders

## 2020-11-14 NOTE — Telephone Encounter (Signed)
Requested Prescriptions   Signed Prescriptions Disp Refills   rosuvastatin (CRESTOR) 40 MG tablet 90 tablet 0    Sig: Take 1 tablet (40 mg total) by mouth at bedtime.    Authorizing Provider: Minna Merritts    Ordering User: Raelene Bott, Roland Prine L   clopidogrel (PLAVIX) 75 MG tablet 90 tablet 0    Sig: Take 1 tablet (75 mg total) by mouth daily.    Authorizing Provider: Minna Merritts    Ordering User: Raelene Bott, Toriano Aikey L

## 2020-11-16 DIAGNOSIS — J069 Acute upper respiratory infection, unspecified: Secondary | ICD-10-CM | POA: Diagnosis not present

## 2020-11-17 ENCOUNTER — Other Ambulatory Visit: Payer: Self-pay | Admitting: "Endocrinology

## 2020-12-11 NOTE — Progress Notes (Signed)
Date:  12/12/2020   ID:  Tracey [name], DOB July 06, 1936, MRN [id number]  Patient Location:  [place] [address]   Provider location:   [facility], [place] office  PCP:  Abner Greenspan, MD  Cardiologist:  Arvid Right [facility]  Chief Complaint  Patient presents with  . Follow-up    12 month F/U    History of Present Illness:    Tracey [name] is a 85 y.o. female  past medical history of coronary artery disease, bypass surgery in 1995,  diabetes  stent to her graft to the OM several years ago with chest pain January 2013  repeat stenting to the graft to the OM with a Xience 3.5 x 18 mm stent  delivery of a shock for resuscitation during the procedure),  left renal PCI in 1998, redo PCI March 2010 followed by [name],  who presents for routine followup of her coronary artery disease  She lost her husband in 2016  [place] 10/2019 In follow-up today reports that she is doing very well B/l knee replacement, complete PT Got married  Active,  No chect pain, no SOB on exertion  HBA1C 6.5 down from 6.8  Stress this AM, with insurance issues coming into our office, everything settled now BP elevated in the office today Does not check it at home  Diarrhea, better Less metformin, ER  near syncope resolved Missing meals  EKG personally reviewed by myself on todays visit Shows sinus bradycardia rate 51 bpm no significant ST-T wave changes  Followed by [name] 08/25/2020, vascular procedure reviewed and discussed with her in detail Atherosclerotic occlusive disease bilateral lower extremities with rest pain and lifestyle limiting claudication; recurrent left renal artery stenosis; renovascular hypertension  Percutaneous transluminal angioplasty and stent placement pararenal aorta   Percutaneous transluminal angioplasty and stent placement left renal artery   Prior CV studies:   The following studies were reviewed today:  Carotid Carotid  artery ultrasound showed 1-39% RICA stenosis and 93-57% LICA stenosis.   Echo: Normal EF 65% RVSP 32 Mild to moderate MR   Past Medical History:  Diagnosis Date  . Anxiety   . Arthritis   . Atrial fibrillation ([place])    history  . Chronic kidney disease    ckd stage II  . Coronary artery disease   . Diabetes mellitus    non insulin dependent  . Dysrhythmia    atrial fibrillation, history of  . GERD (gastroesophageal reflux disease)   . Heart murmur   . History of kidney stones   . Hyperlipidemia   . Hypertension   . Peripheral vascular disease ([place])   . Rectocele   . Syncope 09/2019  . UTI (urinary tract infection)    Past Surgical History:  Procedure Laterality Date  . ABDOMINAL AORTA STENT    . APPENDECTOMY    . BREAST BIOPSY Left    20 years ago  . BREAST BIOPSY Right 2008  . BREAST EXCISIONAL BIOPSY     rt 2008 lt "20 years ago"  . CARDIAC CATHETERIZATION    . CATARACT EXTRACTION W/PHACO Left 08/15/2015   Procedure: CATARACT EXTRACTION PHACO AND INTRAOCULAR LENS PLACEMENT (IOC);  Surgeon: Estill Cotta, MD;  Location: [facility] ORS;  Service: Ophthalmology;  Laterality: Left;  [place] 01:28 AP% 26.4 CDE 35.73 fluid pack # [id number]  . COLONOSCOPY WITH PROPOFOL N/A 01/16/2016   Procedure: COLONOSCOPY WITH PROPOFOL;  Surgeon: Manya Silvas, MD;  Location: [facility] ENDOSCOPY;  Service: Endoscopy;  Laterality: N/A;  . CORONARY ANGIOPLASTY WITH STENT PLACEMENT    . CORONARY ARTERY BYPASS GRAFT  1995   with LIMA  to the LAD, SVG to OM1, SVG to PDA  . ESOPHAGOGASTRODUODENOSCOPY (EGD) WITH PROPOFOL N/A 01/16/2016   Procedure: ESOPHAGOGASTRODUODENOSCOPY (EGD) WITH PROPOFOL;  Surgeon: Manya Silvas, MD;  Location: [facility] ENDOSCOPY;  Service: Endoscopy;  Laterality: N/A;  . EYE SURGERY    . JOINT REPLACEMENT Right   . KNEE ARTHROPLASTY Left 01/06/2020   Procedure: COMPUTER ASSISTED TOTAL KNEE ARTHROPLASTY;  Surgeon: Dereck Leep, MD;  Location: [facility] ORS;  Service: Orthopedics;   Laterality: Left;  . LOWER EXTREMITY ANGIOGRAPHY N/A 07/08/2019   Procedure: Abdominal Aortagraphy;  Surgeon: Algernon Huxley, MD;  Location: [place] CV LAB;  Service: Cardiovascular;  Laterality: N/A;  . RENAL ANGIOGRAPHY Left 08/26/2019   Procedure: RENAL ANGIOGRAPHY;  Surgeon: Algernon Huxley, MD;  Location: [place] CV LAB;  Service: Cardiovascular;  Laterality: Left;  . RENAL ARTERY STENT     left, By Dr Hulda Humphrey  . REPLACEMENT TOTAL KNEE  03/25/2013   right  . TONSILLECTOMY       Current Meds  Medication Sig  . acetaminophen (TYLENOL) 500 MG tablet Take 500 mg by mouth in the morning and at bedtime.  . carvedilol (COREG) 3.125 MG tablet Take 1 tablet (3.125 mg total) by mouth 2 (two) times daily.  . clopidogrel (PLAVIX) 75 MG tablet Take 1 tablet (75 mg total) by mouth daily.  Marland Kitchen CRANBERRY PO Take 1 tablet by mouth daily.  . Cyanocobalamin (VITAMIN B-12 IJ) Inject as directed See admin instructions. Every two months  . glipiZIDE (GLUCOTROL XL) 2.5 MG 24 hr tablet TAKE 1 TABLET BY MOUTH EVERY DAY WITH BREAKFAST  . glucose blood (ACCU-CHEK GUIDE) test strip Use as instructed to check blood glucose 1-2 times daily.  . Lancets MISC 1 each by Does not apply route 2 (two) times daily.  Marland Kitchen lisinopril (ZESTRIL) 10 MG tablet Take 1 tablet (10 mg total) by mouth daily.  . metFORMIN (GLUCOPHAGE-XR) 500 MG 24 hr tablet TAKE 1 TABLET BY MOUTH ONCE DAILY.  . Multiple Vitamins-Minerals (MULTIVITAMIN WITH MINERALS) tablet Take 1 tablet by mouth daily.  . nitroGLYCERIN (NITROSTAT) 0.4 MG SL tablet PLACE 1 TABLET (0.4 MG TOTAL) UNDER THE TONGUE EVERY 5 (FIVE) MINUTES AS NEEDED FOR CHEST PAIN.  [name] Lancets 11H MISC USE TO TEST TWICE A DAY  . pantoprazole (PROTONIX) 40 MG tablet TAKE 1 TABLET BY MOUTH EVERY DAY  . Probiotic Product (ALIGN) 4 MG CAPS Take 1 capsule by mouth daily.  . rosuvastatin (CRESTOR) 40 MG tablet Take 1 tablet (40 mg total) by mouth at bedtime.     Allergies:    Codeine, Erythromycin, Tramadol, and Ciprofloxacin    Family Hx: The patient's family history includes Cancer in her brother; Heart disease in her father; Hyperlipidemia in her daughter; Other in her brother, brother, and brother. There is no history of Breast cancer, Kidney cancer, or Bladder Cancer.  ROS:   Please see the history of present illness.    Review of Systems  Constitutional: Negative.   HENT: Negative.   Respiratory: Negative.   Cardiovascular: Negative.   Gastrointestinal: Negative.   Musculoskeletal: Negative.   Neurological: Negative.   Psychiatric/Behavioral: Negative.   All other systems reviewed and are negative.    Labs/Other Tests and Data Reviewed:    Recent Labs: 03/03/2020: TSH 1.66 10/21/2020: ALT 12; BUN 28; Creatinine,  Ser 1.10; Hemoglobin 12.4; Platelets 140.0; Potassium 4.3; Sodium 140   Recent Lipid Panel Lab Results  Component Value Date/Time   CHOL 144 03/03/2020 12:00 AM   CHOL 141 10/08/2011 04:57 AM   TRIG 127 03/03/2020 12:00 AM   TRIG 179 10/08/2011 04:57 AM   HDL 46 (L) 03/03/2020 12:00 AM   HDL 37 (L) 10/08/2011 04:57 AM   CHOLHDL 3.1 03/03/2020 12:00 AM   LDLCALC 77 03/03/2020 12:00 AM   LDLCALC 68 10/08/2011 04:57 AM    Wt Readings from Last 3 Encounters:  12/12/20 124 lb (56.2 kg)  11/03/20 125 lb 12.8 oz (57.1 kg)  10/27/20 125 lb (56.7 kg)     Exam:    BP (!) 150/64 (BP Location: Left Arm, Patient Position: Sitting, Cuff Size: Normal)   Pulse (!) 51   Ht 5\' 3"  (1.6 m)   Wt 124 lb (56.2 kg)   SpO2 97%   BMI 21.97 kg/m   Constitutional:  oriented to person, place, and time. No distress.  HENT:  Head: Grossly normal Eyes:  no discharge. No scleral icterus.  Neck: No JVD, no carotid bruits  Cardiovascular: Regular rate and rhythm, no murmurs appreciated Pulmonary/Chest: Clear to auscultation bilaterally, no wheezes or rails Abdominal: Soft.  no distension.  no tenderness.  Musculoskeletal: Normal range of  motion Neurological:  normal muscle tone. Coordination normal. No atrophy Skin: Skin warm and dry Psychiatric: normal affect, pleasant   ASSESSMENT & PLAN:    Supraventricular tachycardia Denies having any significant arrhythmia  Syncope, unspecified syncope type Denies any near-syncope or syncope symptoms Remote episode, vasovagal suspected in the setting of GI upset Recommend she stay hydrated, avoid missing meals  PAD (peripheral artery disease) (HCC) Significant aortic disease, renal artery disease Prior intervention by [name],  Cholesterol at goal Long discussion concerning aspirin Plavix, discussed risk of bleeding, she prefers to stay on both for now  Hyperlipidemia Cholesterol is at goal on the current lipid regimen. No changes to the medications were made.  Diabetes type 2 with complications E9F improving Watching her diet, more active   Total encounter time more than 25 minutes  Greater than 50% was spent in counseling and coordination of care with the patient   Signed, Ida Rogue, MD  12/12/2020 10:52 AM    [place] [id number], [place], [place] [address]

## 2020-12-12 ENCOUNTER — Ambulatory Visit (INDEPENDENT_AMBULATORY_CARE_PROVIDER_SITE_OTHER): Payer: Medicare Other | Admitting: Cardiovascular Disease

## 2020-12-12 ENCOUNTER — Encounter: Payer: Self-pay | Admitting: Cardiovascular Disease

## 2020-12-12 ENCOUNTER — Other Ambulatory Visit: Payer: Self-pay

## 2020-12-12 VITALS — BP 150/64 | HR 51 | Ht 63.0 in | Wt 124.0 lb

## 2020-12-12 DIAGNOSIS — I6522 Occlusion and stenosis of left carotid artery: Secondary | ICD-10-CM | POA: Diagnosis not present

## 2020-12-12 DIAGNOSIS — I701 Atherosclerosis of renal artery: Secondary | ICD-10-CM | POA: Diagnosis not present

## 2020-12-12 DIAGNOSIS — I739 Peripheral vascular disease, unspecified: Secondary | ICD-10-CM

## 2020-12-12 DIAGNOSIS — R55 Syncope and collapse: Secondary | ICD-10-CM

## 2020-12-12 DIAGNOSIS — I25118 Atherosclerotic heart disease of native coronary artery with other forms of angina pectoris: Secondary | ICD-10-CM | POA: Diagnosis not present

## 2020-12-12 DIAGNOSIS — I471 Supraventricular tachycardia: Secondary | ICD-10-CM | POA: Diagnosis not present

## 2020-12-12 DIAGNOSIS — E0800 Diabetes mellitus due to underlying condition with hyperosmolarity without nonketotic hyperglycemic-hyperosmolar coma (NKHHC): Secondary | ICD-10-CM

## 2020-12-12 DIAGNOSIS — I10 Essential (primary) hypertension: Secondary | ICD-10-CM | POA: Diagnosis not present

## 2020-12-12 DIAGNOSIS — I700 Atherosclerosis of aorta: Secondary | ICD-10-CM

## 2020-12-12 NOTE — Patient Instructions (Addendum)
Medication Instructions:  No changes   Lab work: Lipids panel (resutls will upload to MyChart, abnormal results, expect a phone call)  Testing/Procedures: No new testing needed   Follow-Up:  . You will need a follow up appointment in 6 months  . Providers on your designated Care Team:   . Murray Hodgkins, NP . Christell Faith, PA-C . Marrianne Mood, PA-C

## 2020-12-13 LAB — LIPID PANEL
Chol/HDL Ratio: 3 ratio (ref 0.0–4.4)
Cholesterol, Total: 158 mg/dL (ref 100–199)
HDL: 52 mg/dL (ref >39)
LDL Chol Calc (NIH): 84 mg/dL (ref 0–99)
Triglycerides: 122 mg/dL (ref 0–149)
VLDL Cholesterol Cal: 22 mg/dL (ref 5–40)

## 2020-12-22 ENCOUNTER — Other Ambulatory Visit: Payer: Self-pay | Admitting: Cardiovascular Disease

## 2020-12-22 ENCOUNTER — Other Ambulatory Visit: Payer: Self-pay | Admitting: Family Medicine

## 2020-12-22 ENCOUNTER — Telehealth: Payer: Self-pay

## 2020-12-22 MED ORDER — EZETIMIBE 10 MG PO TABS: 10.0000 mg | ORAL_TABLET | Freq: Every day | ORAL | 3 refills | Status: DC

## 2020-12-22 NOTE — Telephone Encounter (Signed)
Able to reach pt regarding her recent lab work, provider had a chance to review her results and advised  "Lipid panel   Cholesterol has jumped, now above goal  Would recommend stay on Crestor 40 mg daily, if missing doses will try to take it every day  If taking Crestor daily we will likely need to add Zetia 10 mg daily to achieve goal numbers"    Tracey [name] is in agreeable with trying new medication Zetia 10 mg in addition with her Crestor 40 mg. Will pick up later today at her local pharmacy. All questions or concerns were address and no additional concerns at this time. Agreeable to plan, will call back for anything further.

## 2021-01-25 NOTE — Telephone Encounter (Signed)
error 

## 2021-01-27 ENCOUNTER — Other Ambulatory Visit: Payer: Self-pay | Admitting: Nurse Practitioner

## 2021-01-27 ENCOUNTER — Other Ambulatory Visit: Payer: Self-pay | Admitting: Cardiovascular Disease

## 2021-02-23 ENCOUNTER — Other Ambulatory Visit: Payer: Self-pay

## 2021-02-23 ENCOUNTER — Other Ambulatory Visit: Payer: Medicare Other

## 2021-02-23 ENCOUNTER — Telehealth: Payer: Self-pay | Admitting: *Deleted

## 2021-02-23 ENCOUNTER — Other Ambulatory Visit: Payer: Self-pay | Admitting: Family Medicine

## 2021-02-23 DIAGNOSIS — J069 Acute upper respiratory infection, unspecified: Secondary | ICD-10-CM | POA: Diagnosis not present

## 2021-02-23 DIAGNOSIS — Z20822 Contact with and (suspected) exposure to covid-19: Secondary | ICD-10-CM | POA: Insufficient documentation

## 2021-02-23 NOTE — Telephone Encounter (Signed)
[name] advise [place] pt's husband tested positive for covid. Pt did a at home covid test at the time but it was neg but she had no sxs at the time. 3 days ago pt started to develop sxs. She has cough, ST, feels like heart is racing, fatigue/weakness, boday aches. Pt has no temp.   Family is getting pt to do another at home test since she has sxs now and will update us/ upload pic through mychart.   [place]   FYI to PCP

## 2021-02-23 NOTE — Telephone Encounter (Signed)
I ordered her covid test for this afternoon and will plan on seeing her virtually tomorrow  Thanks

## 2021-02-24 ENCOUNTER — Encounter: Payer: Self-pay | Admitting: Family Medicine

## 2021-02-24 ENCOUNTER — Telehealth (INDEPENDENT_AMBULATORY_CARE_PROVIDER_SITE_OTHER): Payer: Medicare Other | Admitting: Family Medicine

## 2021-02-24 DIAGNOSIS — J069 Acute upper respiratory infection, unspecified: Secondary | ICD-10-CM | POA: Diagnosis not present

## 2021-02-24 LAB — NOVEL CORONAVIRUS, NAA: SARS-CoV-2, NAA: NOT DETECTED

## 2021-02-24 LAB — SARS-COV-2, NAA 2 DAY TAT

## 2021-02-24 MED ORDER — BENZONATATE 200 MG PO CAPS: 200.0000 mg | ORAL_CAPSULE | Freq: Three times a day (TID) | ORAL | 1 refills | Status: DC | PRN

## 2021-02-24 NOTE — Progress Notes (Addendum)
Virtual Visit via Telephone Note  I connected with Tracey [name] on 02/24/21 at 12:30 PM EDT by video communication and verified that I am speaking with the correct person using two identifiers.  Today's visit was conducted by video.  Location: Patient: home Provider: office   I discussed the limitations, risks, security and privacy concerns of performing an evaluation and management service by telephone and the availability of in person appointments. I also discussed with the patient that there may be a patient responsible charge related to this service. The patient expressed understanding and agreed to proceed.  Parties involved in encounter  Patient: Tracey [name]  Provider:  Loura Pardon MD   History of Present Illness: Pt presents for uri symptoms and covid exposure   Feeling some better this am  Symptoms started Tuesday  Headache-much better (after sleep 7 h)  ST -still scratchy and a lot of post nasal drainage  Mucous is clear  Coughing -was terrible and settled down (she took some of her husb tessalon)  Not productive from chest  No sinus pain or pressure   Just a little nasal congestion- uses steam   Taking tylenol ES 1000 mg bid  Temp went up just a little - 99 one time  Had some hot and cold   No n/v/d  No loss of taste and smell  No appetite -still eating however   Glucose is labile (sick)   No wheezing or tight chest or sob     Husband has covid -sick at home with him (he is much better today)  Covid test for her was neg yesterday  She came here for pcr test and that result is still pending   She has a h/o renal insufficiency Last GFR of 46.9  Patient Active Problem List   Diagnosis Date Noted   Viral URI with cough 02/23/2021   Exposure to COVID-19 virus 02/23/2021   Hypercalcemia 03/01/2020   General weakness 02/05/2020   Urine frequency 02/05/2020   Total knee replacement status 01/06/2020   Rectocele 09/27/2019   Vaginal atrophy  09/27/2019   Renal artery stenosis ([place]) 06/30/2019   Diarrhea 06/16/2019   Varicose veins of both lower extremities with inflammation 05/27/2019   Essential hypertension, benign 05/07/2019   Coronary artery disease of native artery of native heart with stable angina pectoris ([place]) 04/01/2019   Medicare annual wellness visit, subsequent 04/01/2019   Syncope 12/25/2018   Palpitations 12/25/2018   PAD (peripheral artery disease) ([place]) 12/25/2018   Stenosis of left carotid artery 12/25/2018   Abnormal urinalysis 10/15/2018   Primary osteoarthritis of left knee 04/27/2018   Weight loss 01/13/2018   PAF (paroxysmal atrial fibrillation) ([place]) 11/11/2017   Routine general medical examination at a health care facility 11/11/2017   Renal insufficiency 11/11/2017   Osteopenia 04/30/2017   Anemia 02/12/2017   Vitamin D deficiency 02/03/2017   Well woman exam 11/05/2016   Vitamin B12 deficiency 11/05/2016   Change in bowel habits 03/07/2015   FH: colon cancer 03/07/2015   GERD (gastroesophageal reflux disease) 03/07/2015   Osteoarthritis 03/15/2014   Hypertension 01/22/2012   CAD (coronary artery disease) 01/22/2012   Status post aorto-coronary artery bypass graft 01/22/2012   Mixed hyperlipidemia 01/22/2012   Type 2 diabetes mellitus with stage 2 chronic kidney disease, without long-term current use of insulin ([place]) 01/22/2012   Atherosclerosis of renal artery ([place]) 01/22/2012   Past Medical History:  Diagnosis Date   Anxiety    Arthritis    Atrial  fibrillation ([place])    history   Chronic kidney disease    ckd stage II   Coronary artery disease    Diabetes mellitus    non insulin dependent   Dysrhythmia    atrial fibrillation, history of   GERD (gastroesophageal reflux disease)    Heart murmur    History of kidney stones    Hyperlipidemia    Hypertension    Peripheral vascular disease ([place])    Rectocele    Syncope 09/2019   UTI (urinary tract infection)    Past Surgical  History:  Procedure Laterality Date   ABDOMINAL AORTA STENT     APPENDECTOMY     BREAST BIOPSY Left    20 years ago   BREAST BIOPSY Right 2008   BREAST EXCISIONAL BIOPSY     rt 2008 lt "20 years ago"   CARDIAC CATHETERIZATION     CATARACT EXTRACTION W/PHACO Left 08/15/2015   Procedure: CATARACT EXTRACTION PHACO AND INTRAOCULAR LENS PLACEMENT ([place]);  Surgeon: Estill Cotta, MD;  Location: [facility] ORS;  Service: Ophthalmology;  Laterality: Left;  [place] 01:28 AP% 26.4 CDE 35.73 fluid pack # [id number]   COLONOSCOPY WITH PROPOFOL N/A 01/16/2016   Procedure: COLONOSCOPY WITH PROPOFOL;  Surgeon: Manya Silvas, MD;  Location: [facility] ENDOSCOPY;  Service: Endoscopy;  Laterality: N/A;   CORONARY ANGIOPLASTY WITH STENT PLACEMENT     CORONARY ARTERY BYPASS GRAFT  1995   with LIMA  to the LAD, SVG to OM1, SVG to PDA   ESOPHAGOGASTRODUODENOSCOPY (EGD) WITH PROPOFOL N/A 01/16/2016   Procedure: ESOPHAGOGASTRODUODENOSCOPY (EGD) WITH PROPOFOL;  Surgeon: Manya Silvas, MD;  Location: [facility] ENDOSCOPY;  Service: Endoscopy;  Laterality: N/A;   EYE SURGERY     JOINT REPLACEMENT Right    KNEE ARTHROPLASTY Left 01/06/2020   Procedure: COMPUTER ASSISTED TOTAL KNEE ARTHROPLASTY;  Surgeon: Dereck Leep, MD;  Location: [facility] ORS;  Service: Orthopedics;  Laterality: Left;   LOWER EXTREMITY ANGIOGRAPHY N/A 07/08/2019   Procedure: Abdominal Aortagraphy;  Surgeon: Algernon Huxley, MD;  Location: [place] CV LAB;  Service: Cardiovascular;  Laterality: N/A;   RENAL ANGIOGRAPHY Left 08/26/2019   Procedure: RENAL ANGIOGRAPHY;  Surgeon: Algernon Huxley, MD;  Location: [place] [facility] LAB;  Service: Cardiovascular;  Laterality: Left;   RENAL ARTERY STENT     left, By Dr Hulda Humphrey   REPLACEMENT TOTAL KNEE  03/25/2013   right   TONSILLECTOMY     Social History   Tobacco Use   Smoking status: Never   Smokeless tobacco: Never  Vaping Use   Vaping Use: Never used  Substance Use Topics   Alcohol use: No   Drug use: No    Family History  Problem Relation Age of Onset   Heart disease Father    Other Brother        open heart surgery   Cancer Brother    Other Brother        open heart surgery   Other Brother        open heart surgery   Hyperlipidemia Daughter    Breast cancer Neg Hx    Kidney cancer Neg Hx    Bladder Cancer Neg Hx    Allergies  Allergen Reactions   Codeine Nausea Only    Sick to her stomach   Erythromycin Nausea And Vomiting   Tramadol Nausea Only and Other (See Comments)    Loss of appetite and did not eat   Ciprofloxacin Diarrhea    Upsets stomach  Current Outpatient Medications on File Prior to Visit  Medication Sig Dispense Refill   acetaminophen (TYLENOL) 500 MG tablet Take 500 mg by mouth in the morning and at bedtime.     carvedilol (COREG) 3.125 MG tablet TAKE 1 TABLET BY MOUTH TWICE DAILY 180 tablet 1   clopidogrel (PLAVIX) 75 MG tablet Take 1 tablet (75 mg total) by mouth daily. 90 tablet 0   CRANBERRY PO Take 1 tablet by mouth daily.     Cyanocobalamin (VITAMIN B-12 IJ) Inject as directed See admin instructions. Every two months     ezetimibe (ZETIA) 10 MG tablet Take 1 tablet (10 mg total) by mouth daily. 90 tablet 3   glipiZIDE (GLUCOTROL XL) 2.5 MG 24 hr tablet TAKE 1 TABLET BY MOUTH ONCE DAILY. 90 tablet 0   glucose blood (ACCU-CHEK GUIDE) test strip USE AS INSTRUCTED TO CHECK BLOOD SUGAR ONE TO TWO TIMES A DAY. 50 strip 0   Lancets MISC 1 each by Does not apply route 2 (two) times daily. 100 each 12   lisinopril (ZESTRIL) 10 MG tablet TAKE 1 TABLET BY MOUTH ONCE DAILY. 90 tablet 1   metFORMIN (GLUCOPHAGE-XR) 500 MG 24 hr tablet TAKE 1 TABLET BY MOUTH ONCE DAILY. 90 tablet 0   Multiple Vitamins-Minerals (MULTIVITAMIN WITH MINERALS) tablet Take 1 tablet by mouth daily.     nitroGLYCERIN (NITROSTAT) 0.4 MG SL tablet PLACE 1 TABLET (0.4 MG TOTAL) UNDER THE TONGUE EVERY 5 (FIVE) MINUTES AS NEEDED FOR CHEST PAIN. 25 tablet 0   OneTouch Delica Lancets 69S MISC USE  TO TEST TWICE A DAY 100 each 1   pantoprazole (PROTONIX) 40 MG tablet TAKE 1 TABLET BY MOUTH ONCE DAILY. 90 tablet 1   Probiotic Product (ALIGN) 4 MG CAPS Take 1 capsule by mouth daily.     rosuvastatin (CRESTOR) 40 MG tablet Take 1 tablet (40 mg total) by mouth at bedtime. 90 tablet 0   No current facility-administered medications on file prior to visit.   Review of Systems  Constitutional:  Positive for malaise/fatigue. Negative for chills and fever.  HENT:  Positive for congestion and sore throat. Negative for ear pain and sinus pain.   Eyes:  Negative for blurred vision, discharge and redness.  Respiratory:  Positive for cough and sputum production. Negative for shortness of breath, wheezing and stridor.   Cardiovascular:  Negative for chest pain, palpitations and leg swelling.  Gastrointestinal:  Negative for abdominal pain, diarrhea, nausea and vomiting.  Musculoskeletal:  Negative for myalgias.  Skin:  Negative for rash.  Neurological:  Positive for headaches. Negative for dizziness.       Headache is gone today   Observations/Objective:  Patient appears well, in no distress Weight is baseline  No facial swelling or asymmetry Normal voice-not hoarse and no slurred speech No obvious tremor or mobility impairment Moving neck and UEs normally Able to hear the call well  Hacky cough noted No sob or audible wheezing  Talkative and mentally sharp with no cognitive changes No skin changes on face or neck , no rash or pallor Affect is normal   Assessment and Plan: Problem List Items Addressed This Visit       Respiratory   Viral URI with cough    Since Tuesday and husband has covid at home Home test neg yesterday but still pending pcr test  Feeling much better overall-reassuring Adv use of tylenol, nasal saline and tessalon (px)  Rest and fluids Update if not starting to improve in  a week or if worsening   ER parameters discussed Will notify with test results  With mild  symptoms - disc tx with molnupiravir but if still improving may not need it         Follow Up Instructions: Drink fluids and rest  Tessalon for cough as needed  Nasal saline spray over the counter may help congestion Tylenol for pain or fever  We will notify you when covid test results return   Glad you are feeling better   I discussed the assessment and treatment plan with the patient. The patient was provided an opportunity to ask questions and all were answered. The patient agreed with the plan and demonstrated an understanding of the instructions.   The patient was advised to call back or seek an in-person evaluation if the symptoms worsen or if the condition fails to improve as anticipated.     Loura Pardon, MD

## 2021-02-24 NOTE — Patient Instructions (Signed)
Drink fluids and rest  Tessalon for cough as needed  Nasal saline spray over the counter may help congestion Tylenol for pain or fever  We will notify you when covid test results return   Glad you are feeling better

## 2021-02-24 NOTE — Assessment & Plan Note (Signed)
Since Tuesday and husband has covid at home Home test neg yesterday but still pending pcr test  Feeling much better overall-reassuring Adv use of tylenol, nasal saline and tessalon (px)  Rest and fluids Update if not starting to improve in a week or if worsening   ER parameters discussed Will notify with test results  With mild symptoms - disc tx with molnupiravir but if still improving may not need it

## 2021-03-07 DIAGNOSIS — Z96652 Presence of left artificial knee joint: Secondary | ICD-10-CM | POA: Diagnosis not present

## 2021-03-14 ENCOUNTER — Other Ambulatory Visit: Payer: Self-pay | Admitting: Family Medicine

## 2021-03-14 ENCOUNTER — Other Ambulatory Visit: Payer: Self-pay | Admitting: Cardiovascular Disease

## 2021-03-14 ENCOUNTER — Other Ambulatory Visit: Payer: Self-pay | Admitting: Nurse Practitioner

## 2021-04-14 ENCOUNTER — Encounter: Payer: Self-pay | Admitting: Family Medicine

## 2021-04-14 ENCOUNTER — Ambulatory Visit (INDEPENDENT_AMBULATORY_CARE_PROVIDER_SITE_OTHER): Payer: Medicare Other | Admitting: Family Medicine

## 2021-04-14 ENCOUNTER — Other Ambulatory Visit: Payer: Self-pay

## 2021-04-14 VITALS — BP 140/70 | HR 55 | Temp 98.2°F | Ht 63.0 in | Wt 128.8 lb

## 2021-04-14 DIAGNOSIS — R300 Dysuria: Secondary | ICD-10-CM | POA: Diagnosis not present

## 2021-04-14 DIAGNOSIS — I10 Essential (primary) hypertension: Secondary | ICD-10-CM

## 2021-04-14 DIAGNOSIS — R309 Painful micturition, unspecified: Secondary | ICD-10-CM | POA: Diagnosis not present

## 2021-04-14 LAB — POCT URINALYSIS DIP (MANUAL ENTRY)
Bilirubin, UA: NEGATIVE
Blood, UA: NEGATIVE
Glucose, UA: NEGATIVE mg/dL
Ketones, POC UA: NEGATIVE mg/dL
Leukocytes, UA: NEGATIVE
Nitrite, UA: NEGATIVE
Spec Grav, UA: 1.02 (ref 1.010–1.025)
Urobilinogen, UA: 0.2 E.U./dL
pH, UA: 6 (ref 5.0–8.0)

## 2021-04-14 NOTE — Patient Instructions (Signed)
Continue cranberry Keep drinking water  Urine is clear  I still sent it for culture   If symptoms worsen over the weekend please contact me

## 2021-04-14 NOTE — Assessment & Plan Note (Signed)
bp in fair control at this time  BP Readings from Last 1 Encounters:  04/14/21 140/70  better on 2nd check  No changes needed Most recent labs reviewed  Disc lifstyle change with low sodium diet and exercise  Lisinopril 10 mg daily Coreg 3.125 bid

## 2021-04-14 NOTE — Progress Notes (Signed)
Subjective:    Patient ID: Tracey [name], female    DOB: May 26, 1936, 85 y.o.   MRN: [id number]  This visit occurred during the SARS-CoV-2 public health emergency.  Safety protocols were in place, including screening questions prior to the visit, additional usage of staff PPE, and extensive cleaning of exam room while observing appropriate contact time as indicated for disinfecting solutions.   HPI Pt presents for urinary symptoms    Wt Readings from Last 3 Encounters:  04/14/21 128 lb 12.8 oz (58.4 kg)  12/12/20 124 lb (56.2 kg)  11/03/20 125 lb 12.8 oz (57.1 kg)   22.82 kg/m  Discomfort in bladder area  Started we night and continued Thursday  A little pain to urinate  Was worse yesterday  No blood in urine but it looked cloudy yesterday   No flank pain  No nausea or fever   Drinks 64 oz fluids daily   She takes cranberry pills usually  She ran out 3 wk ago  ? If that added to it     Blood sugar has run high  Not eating great  Now better- eating better finally  Has endo f.u next month   Results for orders placed or performed in visit on 04/14/21  POCT urinalysis dipstick  Result Value Ref Range   Color, UA yellow yellow   Clarity, UA clear clear   Glucose, UA negative negative mg/dL   Bilirubin, UA negative negative   Ketones, POC UA negative negative mg/dL   Spec Grav, UA 1.020 1.010 - 1.025   Blood, UA negative negative   pH, UA 6.0 5.0 - 8.0   Protein Ur, POC trace (A) negative mg/dL   Urobilinogen, UA 0.2 0.2 or 1.0 E.U./dL   Nitrite, UA Negative Negative   Leukocytes, UA Negative Negative    H/o renal insuff with renal artery stenosis  Lab Results  Component Value Date   CREATININE 1.10 10/21/2020   BUN 28 (H) 10/21/2020   NA 140 10/21/2020   K 4.3 10/21/2020   CL 105 10/21/2020   CO2 29 10/21/2020     Bp today better on 2nd check  BP Readings from Last 3 Encounters:  04/14/21 140/70  12/12/20 (!) 150/64  11/03/20 (!) 159/66     Patient Active Problem List   Diagnosis Date Noted   Dysuria 04/14/2021   Exposure to COVID-19 virus 02/23/2021   Hypercalcemia 03/01/2020   General weakness 02/05/2020   Urine frequency 02/05/2020   Total knee replacement status 01/06/2020   Rectocele 09/27/2019   Vaginal atrophy 09/27/2019   Renal artery stenosis ([place]) 06/30/2019   Diarrhea 06/16/2019   Varicose veins of both lower extremities with inflammation 05/27/2019   Essential hypertension, benign 05/07/2019   Coronary artery disease of native artery of native heart with stable angina pectoris ([place]) 04/01/2019   Medicare annual wellness visit, subsequent 04/01/2019   Syncope 12/25/2018   Palpitations 12/25/2018   PAD (peripheral artery disease) ([place]) 12/25/2018   Stenosis of left carotid artery 12/25/2018   Abnormal urinalysis 10/15/2018   Primary osteoarthritis of left knee 04/27/2018   Weight loss 01/13/2018   PAF (paroxysmal atrial fibrillation) ([place]) 11/11/2017   Routine general medical examination at a health care facility 11/11/2017   Renal insufficiency 11/11/2017   Osteopenia 04/30/2017   Anemia 02/12/2017   Vitamin D deficiency 02/03/2017   Well woman exam 11/05/2016   Vitamin B12 deficiency 11/05/2016   Change in bowel habits 03/07/2015   FH: colon cancer 03/07/2015  GERD (gastroesophageal reflux disease) 03/07/2015   Osteoarthritis 03/15/2014   Hypertension 01/22/2012   CAD (coronary artery disease) 01/22/2012   Status post aorto-coronary artery bypass graft 01/22/2012   Mixed hyperlipidemia 01/22/2012   Type 2 diabetes mellitus with stage 2 chronic kidney disease, without long-term current use of insulin ([place]) 01/22/2012   Atherosclerosis of renal artery ([place]) 01/22/2012   Past Medical History:  Diagnosis Date   Anxiety    Arthritis    Atrial fibrillation ([place])    history   Chronic kidney disease    ckd stage II   Coronary artery disease    Diabetes mellitus    non insulin dependent    Dysrhythmia    atrial fibrillation, history of   GERD (gastroesophageal reflux disease)    Heart murmur    History of kidney stones    Hyperlipidemia    Hypertension    Peripheral vascular disease ([place])    Rectocele    Syncope 09/2019   UTI (urinary tract infection)    Past Surgical History:  Procedure Laterality Date   ABDOMINAL AORTA STENT     APPENDECTOMY     BREAST BIOPSY Left    20 years ago   BREAST BIOPSY Right 2008   BREAST EXCISIONAL BIOPSY     rt 2008 lt "20 years ago"   CARDIAC CATHETERIZATION     CATARACT EXTRACTION W/PHACO Left 08/15/2015   Procedure: CATARACT EXTRACTION PHACO AND INTRAOCULAR LENS PLACEMENT ([place]);  Surgeon: Estill Cotta, MD;  Location: [facility] ORS;  Service: Ophthalmology;  Laterality: Left;  [place] 01:28 AP% 26.4 CDE 35.73 fluid pack # [id number]   COLONOSCOPY WITH PROPOFOL N/A 01/16/2016   Procedure: COLONOSCOPY WITH PROPOFOL;  Surgeon: Manya Silvas, MD;  Location: [facility] ENDOSCOPY;  Service: Endoscopy;  Laterality: N/A;   CORONARY ANGIOPLASTY WITH STENT PLACEMENT     CORONARY ARTERY BYPASS GRAFT  1995   with LIMA  to the LAD, SVG to OM1, SVG to PDA   ESOPHAGOGASTRODUODENOSCOPY (EGD) WITH PROPOFOL N/A 01/16/2016   Procedure: ESOPHAGOGASTRODUODENOSCOPY (EGD) WITH PROPOFOL;  Surgeon: Manya Silvas, MD;  Location: [facility] ENDOSCOPY;  Service: Endoscopy;  Laterality: N/A;   EYE SURGERY     JOINT REPLACEMENT Right    KNEE ARTHROPLASTY Left 01/06/2020   Procedure: COMPUTER ASSISTED TOTAL KNEE ARTHROPLASTY;  Surgeon: Dereck Leep, MD;  Location: [facility] ORS;  Service: Orthopedics;  Laterality: Left;   LOWER EXTREMITY ANGIOGRAPHY N/A 07/08/2019   Procedure: Abdominal Aortagraphy;  Surgeon: Algernon Huxley, MD;  Location: [place] CV LAB;  Service: Cardiovascular;  Laterality: N/A;   RENAL ANGIOGRAPHY Left 08/26/2019   Procedure: RENAL ANGIOGRAPHY;  Surgeon: Algernon Huxley, MD;  Location: [place] [facility];  Service: Cardiovascular;  Laterality: Left;    RENAL ARTERY STENT     left, By Dr Hulda Humphrey   REPLACEMENT TOTAL KNEE  03/25/2013   right   TONSILLECTOMY     Social History   Tobacco Use   Smoking status: Never   Smokeless tobacco: Never  Vaping Use   Vaping Use: Never used  Substance Use Topics   Alcohol use: No   Drug use: No   Family History  Problem Relation Age of Onset   Heart disease Father    Other Brother        open heart surgery   Cancer Brother    Other Brother        open heart surgery   Other Brother        open  heart surgery   Hyperlipidemia Daughter    Breast cancer Neg Hx    Kidney cancer Neg Hx    Bladder Cancer Neg Hx    Allergies  Allergen Reactions   Codeine Nausea Only    Sick to her stomach   Erythromycin Nausea And Vomiting   Tramadol Nausea Only and Other (See Comments)    Loss of appetite and did not eat   Ciprofloxacin Diarrhea    Upsets stomach   Current Outpatient Medications on File Prior to Visit  Medication Sig Dispense Refill   acetaminophen (TYLENOL) 500 MG tablet Take 500 mg by mouth in the morning and at bedtime.     benzonatate (TESSALON) 200 MG capsule Take 1 capsule (200 mg total) by mouth 3 (three) times daily as needed for cough. Swallow whole, do not bite pill 30 capsule 1   carvedilol (COREG) 3.125 MG tablet TAKE 1 TABLET BY MOUTH TWICE DAILY 180 tablet 1   clopidogrel (PLAVIX) 75 MG tablet Take 1 tablet (75 mg total) by mouth daily. 90 tablet 0   CRANBERRY PO Take 1 tablet by mouth daily.     Cyanocobalamin (VITAMIN B-12 IJ) Inject as directed See admin instructions. Every two months     ezetimibe (ZETIA) 10 MG tablet Take 1 tablet (10 mg total) by mouth daily. 90 tablet 3   glipiZIDE (GLUCOTROL XL) 2.5 MG 24 hr tablet TAKE 1 TABLET BY MOUTH ONCE DAILY. 90 tablet 0   glucose blood (ACCU-CHEK GUIDE) test strip USE AS INSTRUCTED TO CHECK BLOOD SUGAR ONE TO TWO TIMES A DAY. 50 strip 0   Lancets MISC 1 each by Does not apply route 2 (two) times daily. 100 each 12   lisinopril  (ZESTRIL) 10 MG tablet TAKE 1 TABLET BY MOUTH ONCE DAILY. 90 tablet 1   metFORMIN (GLUCOPHAGE-XR) 500 MG 24 hr tablet TAKE 1 TABLET BY MOUTH ONCE DAILY. 90 tablet 0   Multiple Vitamins-Minerals (MULTIVITAMIN WITH MINERALS) tablet Take 1 tablet by mouth daily.     nitroGLYCERIN (NITROSTAT) 0.4 MG SL tablet PLACE 1 TABLET (0.4 MG TOTAL) UNDER THE TONGUE EVERY 5 (FIVE) MINUTES AS NEEDED FOR CHEST PAIN. 25 tablet 0   OneTouch Delica Lancets 99991111 MISC USE TO TEST TWICE A DAY 100 each 1   pantoprazole (PROTONIX) 40 MG tablet TAKE 1 TABLET BY MOUTH ONCE DAILY. 90 tablet 1   Probiotic Product (ALIGN) 4 MG CAPS Take 1 capsule by mouth daily.     rosuvastatin (CRESTOR) 40 MG tablet TAKE 1 TABLET BY MOUTH AT BEDTIME FOR CHOLESTEROL 90 tablet 0   No current facility-administered medications on file prior to visit.     Review of Systems  Constitutional:  Negative for activity change, appetite change, fatigue, fever and unexpected weight change.  HENT:  Negative for congestion, ear pain, rhinorrhea, sinus pressure and sore throat.   Eyes:  Negative for pain, redness and visual disturbance.  Respiratory:  Negative for cough, shortness of breath and wheezing.   Cardiovascular:  Negative for chest pain and palpitations.  Gastrointestinal:  Negative for abdominal pain, blood in stool, constipation and diarrhea.  Endocrine: Negative for polydipsia and polyuria.  Genitourinary:  Positive for dysuria and urgency. Negative for frequency, genital sores and hematuria.  Musculoskeletal:  Negative for arthralgias, back pain and myalgias.  Skin:  Negative for pallor and rash.  Allergic/Immunologic: Negative for environmental allergies.  Neurological:  Negative for dizziness, syncope and headaches.  Hematological:  Negative for adenopathy. Does not bruise/bleed easily.  Psychiatric/Behavioral:  Negative for decreased concentration and dysphoric mood. The patient is not nervous/anxious.       Objective:   Physical  Exam Constitutional:      General: She is not in acute distress.    Appearance: Normal appearance. She is normal weight. She is not ill-appearing.  Eyes:     General: No scleral icterus.    Conjunctiva/sclera: Conjunctivae normal.     Pupils: Pupils are equal, round, and reactive to light.  Cardiovascular:     Rate and Rhythm: Regular rhythm. Bradycardia present.     Heart sounds: Normal heart sounds.  Pulmonary:     Effort: Pulmonary effort is normal. No respiratory distress.     Breath sounds: Normal breath sounds.  Abdominal:     General: Abdomen is flat. Bowel sounds are normal. There is no distension.     Tenderness: There is no abdominal tenderness. There is no right CVA tenderness or left CVA tenderness.     Comments: No suprapubic tenderness or fullness     Musculoskeletal:     Cervical back: Neck supple.  Lymphadenopathy:     Cervical: No cervical adenopathy.  Skin:    Coloration: Skin is not pale.     Findings: No erythema.  Neurological:     Mental Status: She is alert.  Psychiatric:        Mood and Affect: Mood normal.          Assessment & Plan:   Problem List Items Addressed This Visit       Cardiovascular and Mediastinum   Essential hypertension, benign    bp in fair control at this time  BP Readings from Last 1 Encounters:  04/14/21 140/70  better on 2nd check  No changes needed Most recent labs reviewed  Disc lifstyle change with low sodium diet and exercise  Lisinopril 10 mg daily Coreg 3.125 bid        Other   Dysuria - Primary    Much worse yesterday and almost resolved today  Drinking lots of fluids (may be helping) ua neg except for trace protein H/o renal insuff Culture sent  Pt got back on cranberry supplement  inst to call if symptoms return or worsen Pend cx -will tx if possible       Relevant Orders   Urine Culture   Other Visit Diagnoses     Painful urination       Relevant Orders   POCT urinalysis dipstick  (Completed)

## 2021-04-14 NOTE — Assessment & Plan Note (Signed)
Much worse yesterday and almost resolved today  Drinking lots of fluids (may be helping) ua neg except for trace protein H/o renal insuff Culture sent  Pt got back on cranberry supplement  inst to call if symptoms return or worsen Pend cx -will tx if possible

## 2021-04-15 LAB — URINE CULTURE
MICRO NUMBER:: 12179835
Result:: NO GROWTH
SPECIMEN QUALITY:: ADEQUATE

## 2021-04-24 ENCOUNTER — Other Ambulatory Visit (INDEPENDENT_AMBULATORY_CARE_PROVIDER_SITE_OTHER): Payer: Medicare Other

## 2021-04-24 DIAGNOSIS — E559 Vitamin D deficiency, unspecified: Secondary | ICD-10-CM

## 2021-04-24 DIAGNOSIS — N182 Chronic kidney disease, stage 2 (mild): Secondary | ICD-10-CM

## 2021-04-24 DIAGNOSIS — N289 Disorder of kidney and ureter, unspecified: Secondary | ICD-10-CM

## 2021-04-24 DIAGNOSIS — E1122 Type 2 diabetes mellitus with diabetic chronic kidney disease: Secondary | ICD-10-CM | POA: Diagnosis not present

## 2021-04-25 LAB — T4, FREE: Free T4: 1.4 ng/dL (ref 0.8–1.8)

## 2021-04-25 LAB — COMPREHENSIVE METABOLIC PANEL
AG Ratio: 1.9 (calc) (ref 1.0–2.5)
ALT: 14 U/L (ref 6–29)
AST: 23 U/L (ref 10–35)
Albumin: 4.4 g/dL (ref 3.6–5.1)
Alkaline phosphatase (APISO): 52 U/L (ref 37–153)
BUN/Creatinine Ratio: 22 (calc) (ref 6–22)
BUN: 22 mg/dL (ref 7–25)
CO2: 29 mmol/L (ref 20–32)
Calcium: 10.6 mg/dL — ABNORMAL HIGH (ref 8.6–10.4)
Chloride: 104 mmol/L (ref 98–110)
Creat: 0.99 mg/dL — ABNORMAL HIGH (ref 0.60–0.95)
Globulin: 2.3 g/dL (calc) (ref 1.9–3.7)
Glucose, Bld: 161 mg/dL — ABNORMAL HIGH (ref 65–99)
Potassium: 4.7 mmol/L (ref 3.5–5.3)
Sodium: 141 mmol/L (ref 135–146)
Total Bilirubin: 0.6 mg/dL (ref 0.2–1.2)
Total Protein: 6.7 g/dL (ref 6.1–8.1)

## 2021-04-25 LAB — VITAMIN D 25 HYDROXY (VIT D DEFICIENCY, FRACTURES): Vit D, 25-Hydroxy: 34 ng/mL (ref 30–100)

## 2021-04-25 LAB — TSH: TSH: 1.99 mIU/L (ref 0.40–4.50)

## 2021-04-25 LAB — LIPID PANEL
Cholesterol: 139 mg/dL (ref <200)
HDL: 51 mg/dL (ref >=50)
LDL Cholesterol (Calc): 70 mg/dL (calc)
Non-HDL Cholesterol (Calc): 88 mg/dL (calc) (ref <130)
Total CHOL/HDL Ratio: 2.7 (calc) (ref <5.0)
Triglycerides: 94 mg/dL (ref <150)

## 2021-04-25 NOTE — Patient Instructions (Signed)

## 2021-04-26 ENCOUNTER — Encounter: Payer: Self-pay | Admitting: Nurse Practitioner

## 2021-04-26 ENCOUNTER — Other Ambulatory Visit: Payer: Self-pay

## 2021-04-26 ENCOUNTER — Ambulatory Visit (INDEPENDENT_AMBULATORY_CARE_PROVIDER_SITE_OTHER): Payer: Medicare Other | Admitting: Nurse Practitioner

## 2021-04-26 VITALS — BP 129/70 | HR 60 | Ht 63.0 in | Wt 129.0 lb

## 2021-04-26 DIAGNOSIS — E1122 Type 2 diabetes mellitus with diabetic chronic kidney disease: Secondary | ICD-10-CM | POA: Diagnosis not present

## 2021-04-26 DIAGNOSIS — E559 Vitamin D deficiency, unspecified: Secondary | ICD-10-CM

## 2021-04-26 DIAGNOSIS — E782 Mixed hyperlipidemia: Secondary | ICD-10-CM | POA: Diagnosis not present

## 2021-04-26 DIAGNOSIS — N182 Chronic kidney disease, stage 2 (mild): Secondary | ICD-10-CM

## 2021-04-26 DIAGNOSIS — I10 Essential (primary) hypertension: Secondary | ICD-10-CM

## 2021-04-26 LAB — POCT GLYCOSYLATED HEMOGLOBIN (HGB A1C): Hemoglobin A1C: 7 % — AB (ref 4.0–5.6)

## 2021-04-26 LAB — POCT UA - MICROALBUMIN
Albumin/Creatinine Ratio, Urine, POC: <30
Creatinine, POC: 50 mg/dL
Microalbumin Ur, POC: 30 mg/L

## 2021-04-26 NOTE — Progress Notes (Signed)
04/26/2021, 11:46 AM  Endocrinology follow-up note   Subjective:    Patient ID: Tracey [name], female    DOB: Oct 08, 1935.  Tracey [name] is being seen  in follow-up after she was seen in consultation for management of currently uncontrolled symptomatic diabetes requested by  Abner Greenspan, MD.   Past Medical History:  Diagnosis Date   Anxiety    Arthritis    Atrial fibrillation ([place])    history   Chronic kidney disease    ckd stage II   Coronary artery disease    Diabetes mellitus    non insulin dependent   Dysrhythmia    atrial fibrillation, history of   GERD (gastroesophageal reflux disease)    Heart murmur    History of kidney stones    Hyperlipidemia    Hypertension    Peripheral vascular disease ([place])    Rectocele    Syncope 09/2019   UTI (urinary tract infection)     Past Surgical History:  Procedure Laterality Date   ABDOMINAL AORTA STENT     APPENDECTOMY     BREAST BIOPSY Left    20 years ago   BREAST BIOPSY Right 2008   BREAST EXCISIONAL BIOPSY     rt 2008 lt "20 years ago"   CARDIAC CATHETERIZATION     CATARACT EXTRACTION W/PHACO Left 08/15/2015   Procedure: CATARACT EXTRACTION PHACO AND INTRAOCULAR LENS PLACEMENT ([place]);  Surgeon: Estill Cotta, MD;  Location: [facility] ORS;  Service: Ophthalmology;  Laterality: Left;  [place] 01:28 AP% 26.4 CDE 35.73 fluid pack # [id number]   COLONOSCOPY WITH PROPOFOL N/A 01/16/2016   Procedure: COLONOSCOPY WITH PROPOFOL;  Surgeon: Manya Silvas, MD;  Location: [facility] ENDOSCOPY;  Service: Endoscopy;  Laterality: N/A;   CORONARY ANGIOPLASTY WITH STENT PLACEMENT     CORONARY ARTERY BYPASS GRAFT  1995   with LIMA  to the LAD, SVG to OM1, SVG to PDA   ESOPHAGOGASTRODUODENOSCOPY (EGD) WITH PROPOFOL N/A 01/16/2016   Procedure: ESOPHAGOGASTRODUODENOSCOPY (EGD) WITH PROPOFOL;  Surgeon: Manya Silvas, MD;  Location: [facility];  Service:  Endoscopy;  Laterality: N/A;   EYE SURGERY     JOINT REPLACEMENT Right    KNEE ARTHROPLASTY Left 01/06/2020   Procedure: COMPUTER ASSISTED TOTAL KNEE ARTHROPLASTY;  Surgeon: Dereck Leep, MD;  Location: [facility] ORS;  Service: Orthopedics;  Laterality: Left;   LOWER EXTREMITY ANGIOGRAPHY N/A 07/08/2019   Procedure: Abdominal Aortagraphy;  Surgeon: Algernon Huxley, MD;  Location: [place] CV LAB;  Service: Cardiovascular;  Laterality: N/A;   RENAL ANGIOGRAPHY Left 08/26/2019   Procedure: RENAL ANGIOGRAPHY;  Surgeon: Algernon Huxley, MD;  Location: [place] [facility];  Service: Cardiovascular;  Laterality: Left;   RENAL ARTERY STENT     left, By Dr Hulda Humphrey   REPLACEMENT TOTAL KNEE  03/25/2013   right   TONSILLECTOMY      Social History   Socioeconomic History   Marital status: Widowed    Spouse name: Not on file   Number of children: Not on file   Years of education: Not on file   Highest education level: Not  on file  Occupational History   Occupation: school cafeteria    Comment: retired  Tobacco Use   Smoking status: Never   Smokeless tobacco: Never  Vaping Use   Vaping Use: Never used  Substance and Sexual Activity   Alcohol use: No   Drug use: No   Sexual activity: Never  Other Topics Concern   Not on file  Social History Narrative   Patient has 3 daughters. Remarried June 2021.   Social Determinants of Health   Financial Resource Strain: Not on file  Food Insecurity: Not on file  Transportation Needs: Not on file  Physical Activity: Not on file  Stress: Not on file  Social Connections: Not on file    Family History  Problem Relation Age of Onset   Heart disease Father    Other Brother        open heart surgery   Cancer Brother    Other Brother        open heart surgery   Other Brother        open heart surgery   Hyperlipidemia Daughter    Breast cancer Neg Hx    Kidney cancer Neg Hx    Bladder Cancer Neg Hx     Outpatient Encounter Medications as of  04/26/2021  Medication Sig   acetaminophen (TYLENOL) 500 MG tablet Take 500 mg by mouth in the morning and at bedtime.   carvedilol (COREG) 3.125 MG tablet TAKE 1 TABLET BY MOUTH TWICE DAILY   clopidogrel (PLAVIX) 75 MG tablet Take 1 tablet (75 mg total) by mouth daily.   CRANBERRY PO Take 1 tablet by mouth daily.   Cyanocobalamin (VITAMIN B-12 IJ) Inject as directed See admin instructions. Every two months   ezetimibe (ZETIA) 10 MG tablet Take 1 tablet (10 mg total) by mouth daily.   glipiZIDE (GLUCOTROL XL) 2.5 MG 24 hr tablet TAKE 1 TABLET BY MOUTH ONCE DAILY.   glucose blood (ACCU-CHEK GUIDE) test strip USE AS INSTRUCTED TO CHECK BLOOD SUGAR ONE TO TWO TIMES A DAY.   Lancets MISC 1 each by Does not apply route 2 (two) times daily.   lisinopril (ZESTRIL) 10 MG tablet TAKE 1 TABLET BY MOUTH ONCE DAILY.   metFORMIN (GLUCOPHAGE-XR) 500 MG 24 hr tablet TAKE 1 TABLET BY MOUTH ONCE DAILY.   Multiple Vitamins-Minerals (MULTIVITAMIN WITH MINERALS) tablet Take 1 tablet by mouth daily.   nitroGLYCERIN (NITROSTAT) 0.4 MG SL tablet PLACE 1 TABLET (0.4 MG TOTAL) UNDER THE TONGUE EVERY 5 (FIVE) MINUTES AS NEEDED FOR CHEST PAIN.   OneTouch Delica Lancets [id number] MISC USE TO TEST TWICE A DAY   pantoprazole (PROTONIX) 40 MG tablet TAKE 1 TABLET BY MOUTH ONCE DAILY.   Probiotic Product (ALIGN) 4 MG CAPS Take 1 capsule by mouth daily.   rosuvastatin (CRESTOR) 40 MG tablet TAKE 1 TABLET BY MOUTH AT BEDTIME FOR CHOLESTEROL   [DISCONTINUED] benzonatate (TESSALON) 200 MG capsule Take 1 capsule (200 mg total) by mouth 3 (three) times daily as needed for cough. Swallow whole, do not bite pill   No facility-administered encounter medications on file as of 04/26/2021.    ALLERGIES: Allergies  Allergen Reactions   Codeine Nausea Only    Sick to her stomach   Erythromycin Nausea And Vomiting   Tramadol Nausea Only and Other (See Comments)    Loss of appetite and did not eat   Ciprofloxacin Diarrhea    Upsets  stomach    VACCINATION STATUS: Immunization History  Administered Date(s)  Administered   Fluad Quad(high Dose 65+) 06/15/2019, 09/26/2020   Influenza Split 08/10/2015   Influenza,inj,Quad PF,6+ Mos 05/24/2016, 07/26/2017, 06/03/2018   Influenza-Unspecified 08/10/2015, 05/24/2016   Moderna Sars-Covid-2 Vaccination 10/02/2019, 10/30/2019, 08/01/2020   Pneumococcal Conjugate-13 11/05/2016   Pneumococcal Polysaccharide-23 12/11/2017   Td 03/17/2013    Diabetes She presents for her follow-up diabetic visit. She has type 2 diabetes mellitus. Onset time: She was diagnosed at approximate age of 45 years. Her disease course has been stable. There are no hypoglycemic associated symptoms. Pertinent negatives for hypoglycemia include no confusion, headaches, pallor or seizures. Pertinent negatives for diabetes include no chest pain, no fatigue, no polydipsia, no polyphagia and no polyuria. There are no hypoglycemic complications. Symptoms are stable. Diabetic complications include heart disease, nephropathy and PVD. Risk factors for coronary artery disease include dyslipidemia, diabetes mellitus, hypertension, sedentary lifestyle and post-menopausal. Current diabetic treatment includes oral agent (dual therapy). She is compliant with treatment all of the time. Her weight is fluctuating minimally. She is following a generally healthy diet. Meal planning includes avoidance of concentrated sweets. She rarely participates in exercise. Her home blood glucose trend is fluctuating minimally. Her breakfast blood glucose range is generally 130-140 mg/dl. (She presents today with her meter and logs showing stable glycemic profile.  Her POCT A1c today is 7%, increasing slightly from last visit of 6.5% but still an acceptable target for her.  She reports not much has changed in her routine except that she sleeps in a bit more and eats dinner a bit later.  She also admits she hasn't been out walking much due to the heat.   She denies any hypoglycemia.) An ACE inhibitor/angiotensin II receptor blocker is being taken. She does not see a podiatrist.Eye exam is current.  Hyperlipidemia This is a chronic problem. The current episode started more than 1 year ago. The problem is controlled. Recent lipid tests were reviewed and are normal. Exacerbating diseases include chronic renal disease and diabetes. Factors aggravating her hyperlipidemia include beta blockers. Pertinent negatives include no chest pain, myalgias or shortness of breath. Current antihyperlipidemic treatment includes statins. The current treatment provides moderate improvement of lipids. There are no compliance problems.  Risk factors for coronary artery disease include dyslipidemia, diabetes mellitus, hypertension, post-menopausal and a sedentary lifestyle.  Hypertension This is a chronic problem. The current episode started more than 1 year ago. The problem has been gradually improving since onset. The problem is controlled. Pertinent negatives include no chest pain, headaches, palpitations or shortness of breath. There are no associated agents to hypertension. Risk factors for coronary artery disease include diabetes mellitus, dyslipidemia, sedentary lifestyle and post-menopausal state. Past treatments include beta blockers and ACE inhibitors. The current treatment provides moderate improvement. There are no compliance problems.  Hypertensive end-organ damage includes kidney disease, CAD/MI and PVD. Identifiable causes of hypertension include chronic renal disease.     Review of systems  Constitutional: + Minimally fluctuating body weight,  current Body mass index is 22.85 kg/m. , no fatigue, no subjective hyperthermia, no subjective hypothermia Eyes: no blurry vision, no xerophthalmia ENT: no sore throat, no nodules palpated in throat, no dysphagia/odynophagia, no hoarseness Cardiovascular: no chest pain, no shortness of breath, no palpitations, no leg  swelling Respiratory: no cough, no shortness of breath Gastrointestinal: no nausea/vomiting/diarrhea Musculoskeletal: no muscle/joint aches Skin: no rashes, no hyperemia Neurological: no tremors, no numbness, no tingling, no dizziness Psychiatric: no depression, no anxiety   Objective:    BP 129/70   Pulse 60  Ht '5\' 3"'$  (1.6 m)   Wt 129 lb (58.5 kg)   BMI 22.85 kg/m   Wt Readings from Last 3 Encounters:  04/26/21 129 lb (58.5 kg)  04/14/21 128 lb 12.8 oz (58.4 kg)  12/12/20 124 lb (56.2 kg)    BP Readings from Last 3 Encounters:  04/26/21 129/70  04/14/21 140/70  12/12/20 (!) 150/64     Physical Exam- Limited  Constitutional:  Body mass index is 22.85 kg/m. , not in acute distress, normal state of mind Eyes:  EOMI, no exophthalmos Neck: Supple Cardiovascular: RRR, no murmurs, rubs, or gallops, no edema Respiratory: Adequate breathing efforts, no crackles, rales, rhonchi, or wheezing Musculoskeletal: no gross deformities, strength intact in all four extremities, no gross restriction of joint movements Skin:  no rashes, no hyperemia Neurological: no tremor with outstretched hands    CMP ( most recent) CMP     Component Value Date/Time   NA 141 04/24/2021 1043   NA 137 03/27/2013 0545   K 4.7 04/24/2021 1043   K 3.9 03/27/2013 0545   CL 104 04/24/2021 1043   CL 103 03/27/2013 0545   CO2 29 04/24/2021 1043   CO2 27 03/27/2013 0545   GLUCOSE 161 (H) 04/24/2021 1043   GLUCOSE 122 (H) 03/27/2013 0545   BUN 22 04/24/2021 1043   BUN 10 03/27/2013 0545   CREATININE 0.99 (H) 04/24/2021 1043   CALCIUM 10.6 (H) 04/24/2021 1043   CALCIUM 8.9 03/27/2013 0545   PROT 6.7 04/24/2021 1043   PROT 7.6 10/07/2011 1316   ALBUMIN 4.5 10/21/2020 1022   ALBUMIN 4.5 10/07/2011 1316   AST 23 04/24/2021 1043   AST 26 10/07/2011 1316   ALT 14 04/24/2021 1043   ALT 26 10/07/2011 1316   ALKPHOS 51 10/21/2020 1022   ALKPHOS 46 (L) 10/07/2011 1316   BILITOT 0.6 04/24/2021 1043    BILITOT 0.5 10/07/2011 1316   GFRNONAA 54 (L) 03/03/2020 0000   GFRAA 63 03/03/2020 0000     Diabetic Labs (most recent): Lab Results  Component Value Date   HGBA1C 7.0 (A) 04/26/2021   HGBA1C 6.5 10/27/2020   HGBA1C 6.7 (A) 06/24/2020     Lipid Panel ( most recent) Lipid Panel     Component Value Date/Time   CHOL 139 04/24/2021 1043   CHOL 158 12/12/2020 1115   CHOL 141 10/08/2011 0457   TRIG 94 04/24/2021 1043   TRIG 179 10/08/2011 0457   HDL 51 04/24/2021 1043   HDL 52 12/12/2020 1115   HDL 37 (L) 10/08/2011 0457   CHOLHDL 2.7 04/24/2021 1043   VLDL 29.0 03/05/2019 0916   VLDL 36 10/08/2011 0457   LDLCALC 70 04/24/2021 1043   LDLCALC 68 10/08/2011 0457      Lab Results  Component Value Date   TSH 1.99 04/24/2021   TSH 1.66 03/03/2020   TSH 2.68 03/05/2019   TSH 2.96 11/06/2017   TSH 1.24 02/01/2017   TSH 1.86 02/06/2016   FREET4 1.4 04/24/2021   FREET4 1.4 03/03/2020      Assessment & Plan:   1) Type 2 diabetes complicated by stage 2 CKD  - Tracey [name] has currently controlled asymptomatic type 2 DM since  85 years of age.  She presents today with her meter and logs showing stable glycemic profile.  Her POCT A1c today is 7%, increasing slightly from last visit of 6.5% but still an acceptable target for her.  She reports not much has changed in her routine except  that she sleeps in a bit more and eats dinner a bit later.  She also admits she hasn't been out walking much due to the heat.  She denies any hypoglycemia.  - I had a long discussion with her about the progressive nature of diabetes and the pathology behind its complications. -her diabetes is complicated by stage 2 CKD and she remains at a high risk for more acute and chronic complications which include CAD, CVA, CKD, retinopathy, and neuropathy. These are all discussed in detail with her.  - Nutritional counseling repeated at each appointment due to patients tendency to fall back in to old  habits.  - The patient admits there is a room for improvement in their diet and drink choices. -  Suggestion is made for the patient to avoid simple carbohydrates from their diet including Cakes, Sweet Desserts / Pastries, Ice Cream, Soda (diet and regular), Sweet Tea, Candies, Chips, Cookies, Sweet Pastries, Store Bought Juices, Alcohol in Excess of 1-2 drinks a day, Artificial Sweeteners, Coffee Creamer, and "Sugar-free" Products. This will help patient to have stable blood glucose profile and potentially avoid unintended weight gain.   - I encouraged the patient to switch to unprocessed or minimally processed complex starch and increased protein intake (animal or plant source), fruits, and vegetables.   - Patient is advised to stick to a routine mealtimes to eat 3 meals a day and avoid unnecessary snacks (to snack only to correct hypoglycemia).  - I have approached her with the following individualized plan to manage  her diabetes and patient agrees:   -Given her control of diabetes, she can continue with current medication plan.  -She is advised to continue Metformin 500 mg XR po daily with breakfast and Glipizide 2.5 mg XL PO daily with breakfast.    -She is encouraged to continue monitoring blood glucose at least once daily, before breakfast and call the clinic if she gets readings less than 70 or greater than 200 for 3 tests in a row.  - she is not a candidate for SGLT2 inhibitors due to concurrent renal insufficiency.  - she is not a candidate for incretin therapy due to her body habitus.    - Patient specific target  A1c;  LDL, HDL, Triglycerides, and  Waist Circumference were discussed in detail.  2) Blood Pressure /Hypertension:  Her blood pressure is controlled to target.  She is advised to continue Coreg 3.125 mg po twice daily and Lisinopril 10 mg po daily.   3) Lipids/Hyperlipidemia: Her most recent lipid panel from 04/24/21 shows controlled LDL at 70.  She is advised to  continue Crestor 40 mg po daily at bedtime.  Side effects and precautions discussed with her.    4)  Weight/Diet: Her Body mass index is 22.85 kg/m.  -She is not a candidate for weight loss.  Exercise, and detailed carbohydrates information provided  -  detailed on discharge instructions.  5) Chronic Care/Health Maintenance: -she is on ACEI/ARB and Statin medications and  is encouraged to initiate and continue to follow up with Ophthalmology, Dentist,  Podiatrist at least yearly or according to recommendations, and advised to   stay away from smoking. I have recommended yearly flu vaccine and pneumonia vaccine at least every 5 years; moderate intensity exercise for up to 150 minutes weekly; and  sleep for at least 7 hours a day.  - she is advised to maintain close follow up with [facility], Wynelle Fanny, MD for primary care needs, as well as her  other providers for optimal and coordinated care.    I spent 30 minutes in the care of the patient today including review of labs from [facility], Lipids, Thyroid Function, Hematology (current and previous including abstractions from other facilities); face-to-face time discussing  her blood glucose readings/logs, discussing hypoglycemia and hyperglycemia episodes and symptoms, medications doses, her options of short and long term treatment based on the latest standards of care / guidelines;  discussion about incorporating lifestyle medicine;  and documenting the encounter.    Please refer to Patient Instructions for Blood Glucose Monitoring and Insulin/Medications Dosing Guide"  in media tab for additional information. Please  also refer to " Patient Self Inventory" in the Media  tab for reviewed elements of pertinent patient history.  Francene Boyers Angevine participated in the discussions, expressed understanding, and voiced agreement with the above plans.  All questions were answered to her satisfaction. she is encouraged to contact clinic should she have any questions or  concerns prior to her return visit.    Follow up plan: - Return in about 6 months (around 10/27/2021) for Diabetes F/U with A1c in office, No previsit labs, Bring meter and logs.  Rayetta Pigg, [facility] [address] [place], [place] [address] Phone: [phone] Fax: [phone]  04/26/2021, 11:46 AM

## 2021-05-02 ENCOUNTER — Other Ambulatory Visit: Payer: Self-pay | Admitting: Cardiovascular Disease

## 2021-05-03 ENCOUNTER — Encounter: Payer: Self-pay | Admitting: Family Medicine

## 2021-05-03 DIAGNOSIS — D3131 Benign neoplasm of right choroid: Secondary | ICD-10-CM | POA: Diagnosis not present

## 2021-05-03 DIAGNOSIS — E119 Type 2 diabetes mellitus without complications: Secondary | ICD-10-CM | POA: Diagnosis not present

## 2021-05-03 LAB — HM DIABETES EYE EXAM

## 2021-05-04 DIAGNOSIS — S6992XA Unspecified injury of left wrist, hand and finger(s), initial encounter: Secondary | ICD-10-CM | POA: Diagnosis not present

## 2021-05-05 ENCOUNTER — Other Ambulatory Visit: Payer: Self-pay

## 2021-05-05 ENCOUNTER — Encounter: Payer: Self-pay | Admitting: Family Medicine

## 2021-05-05 ENCOUNTER — Ambulatory Visit: Payer: Self-pay

## 2021-05-05 ENCOUNTER — Ambulatory Visit (INDEPENDENT_AMBULATORY_CARE_PROVIDER_SITE_OTHER): Payer: Medicare Other | Admitting: Family Medicine

## 2021-05-05 VITALS — BP 144/74 | HR 69 | Ht 63.0 in

## 2021-05-05 DIAGNOSIS — M25532 Pain in left wrist: Secondary | ICD-10-CM | POA: Diagnosis not present

## 2021-05-05 DIAGNOSIS — S62102A Fracture of unspecified carpal bone, left wrist, initial encounter for closed fracture: Secondary | ICD-10-CM

## 2021-05-05 DIAGNOSIS — S52502A Unspecified fracture of the lower end of left radius, initial encounter for closed fracture: Secondary | ICD-10-CM | POA: Insufficient documentation

## 2021-05-05 DIAGNOSIS — S52592A Other fractures of lower end of left radius, initial encounter for closed fracture: Secondary | ICD-10-CM | POA: Diagnosis not present

## 2021-05-05 NOTE — Assessment & Plan Note (Signed)
Patient did have what appeared to be more of a distal radius fracture.  He does have some very minimal displacement noted on ultrasound.  Patient did bring in x-rays that were fairly poor quality.  Patient does have posttraumatic osteoarthritic changes of multiple joints but did appear to have a very small fracture noted of the distal radius and initially the scaphoid.  Patient does not ultrasound also has a scaphoid bone irregularity.  Patient also has what appears to be fairly severe osteopenia versus osteoporosis and will get a bone density lipid being greater than 85 years old.  Encouraged her to continue vitamin D.  Exos brace applied today by Product/process development scientist.  We will have patient come back in 2 weeks and we will repeat x-rays at that time.  Due to patient's osteopenia  Versus osteoporosis patient may not be a surgical candidate and likely will hopefully respond well to conservative therapy

## 2021-05-05 NOTE — Patient Instructions (Addendum)
Good to see you.  Can take IBU or Tylenol for pain Due to osteoporosis we will get bone density Try to avoid any surgery  See me again in  2 weeks-XRAY first next visit If symptoms worsen, please seek medical care at the emergency room

## 2021-05-05 NOTE — Progress Notes (Signed)
[place] Phone: [phone] Subjective:   Fontaine No, am serving as a scribe for Dr. Hulan Saas. This visit occurred during the SARS-CoV-2 public health emergency.  Safety protocols were in place, including screening questions prior to the visit, additional usage of staff PPE, and extensive cleaning of exam room while observing appropriate contact time as indicated for disinfecting solutions.   I'm seeing this patient by the request  of:  Tower, Wynelle Fanny, MD  CC: Left wrist injury  [id number]  Vernestine Rients Simonet is a 85 y.o. female coming in with complaint of L wrist pain after suffering a fall on 05/04/21. Patient was having cramps in middle of night and has neuropathy and ended up suffering [place] injury. Using Tylenol and IBU. Pain with ulnar deviation over distal ulna and dorsal wrist.   Patient did bring in x-rays from an outside facility.  These were independently visualized by me.  Patient has significant posttraumatic arthritic changes of the wrist noted.  Patient has significant arthritic changes of the [facility] joint with what appears to be an old avulsion fracture noted.  Difficult to see secondary to osteoporosis but questionable distal radius fracture.  Patient also has a irregularity noted of the scaphoid bone in the mid body that could be also new fracture     Past Medical History:  Diagnosis Date   Anxiety    Arthritis    Atrial fibrillation ([place])    history   Chronic kidney disease    ckd stage II   Coronary artery disease    Diabetes mellitus    non insulin dependent   Dysrhythmia    atrial fibrillation, history of   GERD (gastroesophageal reflux disease)    Heart murmur    History of kidney stones    Hyperlipidemia    Hypertension    Peripheral vascular disease ([place])    Rectocele    Syncope 09/2019   UTI (urinary tract infection)    Past Surgical History:  Procedure Laterality Date    ABDOMINAL AORTA STENT     APPENDECTOMY     BREAST BIOPSY Left    20 years ago   BREAST BIOPSY Right 2008   BREAST EXCISIONAL BIOPSY     rt 2008 lt "20 years ago"   CARDIAC CATHETERIZATION     CATARACT EXTRACTION W/PHACO Left 08/15/2015   Procedure: CATARACT EXTRACTION PHACO AND INTRAOCULAR LENS PLACEMENT ([place]);  Surgeon: Estill Cotta, MD;  Location: [facility] ORS;  Service: Ophthalmology;  Laterality: Left;  [place] 01:28 AP% 26.4 CDE 35.73 fluid pack # [id number]   COLONOSCOPY WITH PROPOFOL N/A 01/16/2016   Procedure: COLONOSCOPY WITH PROPOFOL;  Surgeon: Manya Silvas, MD;  Location: [facility] ENDOSCOPY;  Service: Endoscopy;  Laterality: N/A;   CORONARY ANGIOPLASTY WITH STENT PLACEMENT     CORONARY ARTERY BYPASS GRAFT  1995   with LIMA  to the LAD, SVG to OM1, SVG to PDA   ESOPHAGOGASTRODUODENOSCOPY (EGD) WITH PROPOFOL N/A 01/16/2016   Procedure: ESOPHAGOGASTRODUODENOSCOPY (EGD) WITH PROPOFOL;  Surgeon: Manya Silvas, MD;  Location: [facility] ENDOSCOPY;  Service: Endoscopy;  Laterality: N/A;   EYE SURGERY     JOINT REPLACEMENT Right    KNEE ARTHROPLASTY Left 01/06/2020   Procedure: COMPUTER ASSISTED TOTAL KNEE ARTHROPLASTY;  Surgeon: Dereck Leep, MD;  Location: [facility] ORS;  Service: Orthopedics;  Laterality: Left;   LOWER EXTREMITY ANGIOGRAPHY N/A 07/08/2019   Procedure: Abdominal Aortagraphy;  Surgeon: Algernon Huxley, MD;  Location: [place] CV LAB;  Service: Cardiovascular;  Laterality: N/A;   RENAL ANGIOGRAPHY Left 08/26/2019   Procedure: RENAL ANGIOGRAPHY;  Surgeon: Algernon Huxley, MD;  Location: [place] CV LAB;  Service: Cardiovascular;  Laterality: Left;   RENAL ARTERY STENT     left, By Dr Hulda Humphrey   REPLACEMENT TOTAL KNEE  03/25/2013   right   TONSILLECTOMY     Social History   Socioeconomic History   Marital status: Widowed    Spouse name: Not on file   Number of children: Not on file   Years of education: Not on file   Highest education level: Not on file  Occupational  History   Occupation: school cafeteria    Comment: retired  Tobacco Use   Smoking status: Never   Smokeless tobacco: Never  Vaping Use   Vaping Use: Never used  Substance and Sexual Activity   Alcohol use: No   Drug use: No   Sexual activity: Never  Other Topics Concern   Not on file  Social History Narrative   Patient has 3 daughters. Remarried June 2021.   Social Determinants of Health   Financial Resource Strain: Not on file  Food Insecurity: Not on file  Transportation Needs: Not on file  Physical Activity: Not on file  Stress: Not on file  Social Connections: Not on file   Allergies  Allergen Reactions   Codeine Nausea Only    Sick to her stomach   Erythromycin Nausea And Vomiting   Tramadol Nausea Only and Other (See Comments)    Loss of appetite and did not eat   Ciprofloxacin Diarrhea    Upsets stomach   Family History  Problem Relation Age of Onset   Heart disease Father    Other Brother        open heart surgery   Cancer Brother    Other Brother        open heart surgery   Other Brother        open heart surgery   Hyperlipidemia Daughter    Breast cancer Neg Hx    Kidney cancer Neg Hx    Bladder Cancer Neg Hx     Current Outpatient Medications (Endocrine & Metabolic):    glipiZIDE (GLUCOTROL XL) 2.5 MG 24 hr tablet, TAKE 1 TABLET BY MOUTH ONCE DAILY.   metFORMIN (GLUCOPHAGE-XR) 500 MG 24 hr tablet, TAKE 1 TABLET BY MOUTH ONCE DAILY.  Current Outpatient Medications (Cardiovascular):    carvedilol (COREG) 3.125 MG tablet, TAKE 1 TABLET BY MOUTH TWICE DAILY   lisinopril (ZESTRIL) 10 MG tablet, TAKE 1 TABLET BY MOUTH ONCE DAILY.   nitroGLYCERIN (NITROSTAT) 0.4 MG SL tablet, PLACE 1 TABLET (0.4 MG TOTAL) UNDER THE TONGUE EVERY 5 (FIVE) MINUTES AS NEEDED FOR CHEST PAIN.   rosuvastatin (CRESTOR) 40 MG tablet, TAKE 1 TABLET BY MOUTH AT BEDTIME FOR CHOLESTEROL   ezetimibe (ZETIA) 10 MG tablet, Take 1 tablet (10 mg total) by mouth daily.   Current  Outpatient Medications (Analgesics):    acetaminophen (TYLENOL) 500 MG tablet, Take 500 mg by mouth in the morning and at bedtime.  Current Outpatient Medications (Hematological):    clopidogrel (PLAVIX) 75 MG tablet, Take 1 tablet (75 mg total) by mouth daily.   Cyanocobalamin (VITAMIN B-12 IJ), Inject as directed See admin instructions. Every two months  Current Outpatient Medications (Other):    CRANBERRY PO, Take 1 tablet by mouth daily.   glucose blood (ACCU-CHEK GUIDE) test strip, USE AS INSTRUCTED  TO CHECK BLOOD SUGAR ONE TO TWO TIMES A DAY.   Lancets MISC, 1 each by Does not apply route 2 (two) times daily.   Multiple Vitamins-Minerals (MULTIVITAMIN WITH MINERALS) tablet, Take 1 tablet by mouth daily.   OneTouch Delica Lancets 99991111 MISC, USE TO TEST TWICE A DAY   pantoprazole (PROTONIX) 40 MG tablet, TAKE 1 TABLET BY MOUTH ONCE DAILY.   Probiotic Product (ALIGN) 4 MG CAPS, Take 1 capsule by mouth daily.   Reviewed prior external information including notes and imaging from  primary care provider As well as notes that were available from care everywhere and other healthcare systems.  Past medical history, social, surgical and family history all reviewed in electronic medical record.  No pertanent information unless stated regarding to the chief complaint.   Review of Systems:  No headache, visual changes, nausea, vomiting, diarrhea, constipation, dizziness, abdominal pain, skin rash, fevers, chills, night sweats, weight loss, swollen lymph nodes, body aches, joint swelling, chest pain, shortness of breath, mood changes. POSITIVE muscle aches  Objective  Blood pressure (!) 144/74, pulse 69, height '5\' 3"'$  (1.6 m), SpO2 99 %.   General: No apparent distress alert and oriented x3 mood and affect normal, dressed appropriately.  HEENT: Pupils equal, extraocular movements intact  Respiratory: Patient's speak in full sentences and does not appear short of breath  Cardiovascular: No lower  extremity edema, non tender, no erythema  Gait normal with good balance and coordination.  MSK: Left wrist exam does have some bruising noted.  Patient does have decent range of motion.  Neurovascular intact distally with good grip strength.  Good capillary refill.  Patient has full range of motion of the left elbow in the left shoulder with no significant pain to palpation. Left wrist does have pain on the dorsal aspect of the wrist over the distal radius.  Ulna has very mild discomfort as well.  Patient does have severe pain in the anatomical snuffbox  Limited muscular skeletal ultrasound was performed and interpreted by Hulan Saas, M   Limited ultrasound shows that patient does have a cortical irregularity with likely fracture noted mild displacement of the distal radius.  Does appear to be potentially intra-articular.  Mild hypoechoic changes surrounding the area.  Patient does have hypoechoic changes around the scaphoid bone and does seem to have a cortical irregularity noted here as well.  No displacement of this bone noted.  Ulna appears to be unremarkable.  Significant arthritic changes of the wrist noted. Impression: Likely distal radius fracture with mild displacement and scaphoid fracture.   Impression and Recommendations:     The above documentation has been reviewed and is accurate and complete Lyndal Pulley, DO

## 2021-05-12 ENCOUNTER — Other Ambulatory Visit: Payer: Self-pay | Admitting: Nurse Practitioner

## 2021-05-15 ENCOUNTER — Telehealth: Payer: Self-pay | Admitting: Family Medicine

## 2021-05-15 DIAGNOSIS — R252 Cramp and spasm: Secondary | ICD-10-CM | POA: Insufficient documentation

## 2021-05-15 MED ORDER — GABAPENTIN 100 MG PO CAPS: 100.0000 mg | ORAL_CAPSULE | Freq: Every evening | ORAL | 0 refills | Status: DC | PRN

## 2021-05-15 NOTE — Telephone Encounter (Signed)
Patient has been having increased issues with leg cramping at night.  The gabapentin helped with that and also helped her be able to sleep better at night.   She fell and fx her wrist due to fall in the middle of the night r/t  severe leg cramping.   Requesting provider consider prescribing gabapentin for her to take nightly for cramping.  Also, her wrist continues to be painful, not sure if gabapentin would help with the discomfort there as well as she is down to just taking Tylenol now for pain and has stopped the Advil.  FYI:  Dr. Tamala Julian started her on Vitamin D daily at her visit with him.  She has follow up with him this Friday and will have a bone density as well on the same day due to concerns for osteoporosis as evidence on xrays.   Please advise on gabapentin. Ok to discuss with granddaughter, Leafy Ro.   Pharmacy ([place])

## 2021-05-15 NOTE — Telephone Encounter (Signed)
We may want to consider labs to see if there is a reason why she is cramping  I ordered labs  Please sched non fasting labs   Gabapentin is ok but it can increase fall risk also  Use great caution (is sedated or dizzy then stop it)  Low dose 100 mg at bedtime It helps nerve pain so may on help wrist

## 2021-05-16 NOTE — Telephone Encounter (Signed)
[name] notified of Dr. Marliss Coots comments

## 2021-05-19 ENCOUNTER — Inpatient Hospital Stay: Admission: RE | Admit: 2021-05-19 | Payer: Medicare Other | Source: Ambulatory Visit

## 2021-05-19 ENCOUNTER — Other Ambulatory Visit: Payer: Self-pay

## 2021-05-19 ENCOUNTER — Encounter: Payer: Self-pay | Admitting: Family Medicine

## 2021-05-19 ENCOUNTER — Ambulatory Visit (INDEPENDENT_AMBULATORY_CARE_PROVIDER_SITE_OTHER): Payer: Medicare Other | Admitting: Family Medicine

## 2021-05-19 ENCOUNTER — Ambulatory Visit (INDEPENDENT_AMBULATORY_CARE_PROVIDER_SITE_OTHER): Payer: Medicare Other

## 2021-05-19 ENCOUNTER — Ambulatory Visit: Payer: Self-pay

## 2021-05-19 VITALS — BP 128/70 | HR 56 | Ht 63.0 in

## 2021-05-19 DIAGNOSIS — M25532 Pain in left wrist: Secondary | ICD-10-CM

## 2021-05-19 DIAGNOSIS — M1812 Unilateral primary osteoarthritis of first carpometacarpal joint, left hand: Secondary | ICD-10-CM | POA: Insufficient documentation

## 2021-05-19 DIAGNOSIS — S52592A Other fractures of lower end of left radius, initial encounter for closed fracture: Secondary | ICD-10-CM | POA: Diagnosis not present

## 2021-05-19 NOTE — Patient Instructions (Signed)
Looks fantastic overall  Keep working on the range of motion but careful lifting anything more then 10# for another 2 weeks.  Wear brace only if needed Squeeze ball when watching TV or something Have an appointment in 4 weeks just in case but if doing well see me when you need me

## 2021-05-19 NOTE — Assessment & Plan Note (Addendum)
Patient's wrist is looking remarkably better.  Secondary to patient already having significant decrease in the swelling and able to have good movement I would like patient to stay out of the brace when possible at home.  Still be very cautious with lifting things.  Discussed which activities to do which wants to avoid.  Patient will follow up with me again 4 to 6 weeks if needed.

## 2021-05-19 NOTE — Progress Notes (Signed)
[place] Phone: [phone] Subjective:   Fontaine No, am serving as a scribe for Dr. Hulan Saas.  This visit occurred during the SARS-CoV-2 public health emergency.  Safety protocols were in place, including screening questions prior to the visit, additional usage of staff PPE, and extensive cleaning of exam room while observing appropriate contact time as indicated for disinfecting solutions.    I'm seeing this patient by the request  of:  Tower, Wynelle Fanny, MD  CC: Wrist pain follow-up  [id number]  05/05/2021 Patient did have what appeared to be more of a distal radius fracture.  He does have some very minimal displacement noted on ultrasound.  Patient did bring in x-rays that were fairly poor quality.  Patient does have posttraumatic osteoarthritic changes of multiple joints but did appear to have a very small fracture noted of the distal radius and initially the scaphoid.  Patient does not ultrasound also has a scaphoid bone irregularity.  Patient also has what appears to be fairly severe osteopenia versus osteoporosis and will get a bone density lipid being greater than 11 years old.  Encouraged her to continue vitamin D.  Exos brace applied today by Product/process development scientist.  We will have patient come back in 2 weeks and we will repeat x-rays at that time.  Due to patient's osteopenia  Versus osteoporosis patient may not be a surgical candidate and likely will hopefully respond well to conservative therapy  Update 05/19/2021 Marrissa Tezeno Schroepfer is a 85 y.o. female coming in with complaint of L wrist pain. Patient states that she has not had much pain in the brace. She did have pain today when she removed brace. No pain with AROM though now that she is out of brace.  Patient states that overall doing much better.  Still has some mild discomfort from time to time but nothing significant.     Past Medical History:  Diagnosis  Date   Anxiety    Arthritis    Atrial fibrillation ([place])    history   Chronic kidney disease    ckd stage II   Coronary artery disease    Diabetes mellitus    non insulin dependent   Dysrhythmia    atrial fibrillation, history of   GERD (gastroesophageal reflux disease)    Heart murmur    History of kidney stones    Hyperlipidemia    Hypertension    Peripheral vascular disease ([place])    Rectocele    Syncope 09/2019   UTI (urinary tract infection)    Past Surgical History:  Procedure Laterality Date   ABDOMINAL AORTA STENT     APPENDECTOMY     BREAST BIOPSY Left    20 years ago   BREAST BIOPSY Right 2008   BREAST EXCISIONAL BIOPSY     rt 2008 lt "20 years ago"   CARDIAC CATHETERIZATION     CATARACT EXTRACTION W/PHACO Left 08/15/2015   Procedure: CATARACT EXTRACTION PHACO AND INTRAOCULAR LENS PLACEMENT ([place]);  Surgeon: Estill Cotta, MD;  Location: [facility] ORS;  Service: Ophthalmology;  Laterality: Left;  [place] 01:28 AP% 26.4 CDE 35.73 fluid pack # [id number]   COLONOSCOPY WITH PROPOFOL N/A 01/16/2016   Procedure: COLONOSCOPY WITH PROPOFOL;  Surgeon: Manya Silvas, MD;  Location: [facility] ENDOSCOPY;  Service: Endoscopy;  Laterality: N/A;   CORONARY ANGIOPLASTY WITH STENT PLACEMENT     CORONARY ARTERY BYPASS GRAFT  1995   with LIMA  to the  LAD, SVG to OM1, SVG to PDA   ESOPHAGOGASTRODUODENOSCOPY (EGD) WITH PROPOFOL N/A 01/16/2016   Procedure: ESOPHAGOGASTRODUODENOSCOPY (EGD) WITH PROPOFOL;  Surgeon: Manya Silvas, MD;  Location: [facility] ENDOSCOPY;  Service: Endoscopy;  Laterality: N/A;   EYE SURGERY     JOINT REPLACEMENT Right    KNEE ARTHROPLASTY Left 01/06/2020   Procedure: COMPUTER ASSISTED TOTAL KNEE ARTHROPLASTY;  Surgeon: Dereck Leep, MD;  Location: [facility] ORS;  Service: Orthopedics;  Laterality: Left;   LOWER EXTREMITY ANGIOGRAPHY N/A 07/08/2019   Procedure: Abdominal Aortagraphy;  Surgeon: Algernon Huxley, MD;  Location: [place] CV LAB;  Service: Cardiovascular;   Laterality: N/A;   RENAL ANGIOGRAPHY Left 08/26/2019   Procedure: RENAL ANGIOGRAPHY;  Surgeon: Algernon Huxley, MD;  Location: [place] CV LAB;  Service: Cardiovascular;  Laterality: Left;   RENAL ARTERY STENT     left, By Dr Hulda Humphrey   REPLACEMENT TOTAL KNEE  03/25/2013   right   TONSILLECTOMY     Social History   Socioeconomic History   Marital status: Widowed    Spouse name: Not on file   Number of children: Not on file   Years of education: Not on file   Highest education level: Not on file  Occupational History   Occupation: school cafeteria    Comment: retired  Tobacco Use   Smoking status: Never   Smokeless tobacco: Never  Vaping Use   Vaping Use: Never used  Substance and Sexual Activity   Alcohol use: No   Drug use: No   Sexual activity: Never  Other Topics Concern   Not on file  Social History Narrative   Patient has 3 daughters. Remarried June 2021.   Social Determinants of Health   Financial Resource Strain: Not on file  Food Insecurity: Not on file  Transportation Needs: Not on file  Physical Activity: Not on file  Stress: Not on file  Social Connections: Not on file   Allergies  Allergen Reactions   Codeine Nausea Only    Sick to her stomach   Erythromycin Nausea And Vomiting   Tramadol Nausea Only and Other (See Comments)    Loss of appetite and did not eat   Ciprofloxacin Diarrhea    Upsets stomach   Family History  Problem Relation Age of Onset   Heart disease Father    Other Brother        open heart surgery   Cancer Brother    Other Brother        open heart surgery   Other Brother        open heart surgery   Hyperlipidemia Daughter    Breast cancer Neg Hx    Kidney cancer Neg Hx    Bladder Cancer Neg Hx     Current Outpatient Medications (Endocrine & Metabolic):    glipiZIDE (GLUCOTROL XL) 2.5 MG 24 hr tablet, TAKE 1 TABLET BY MOUTH ONCE DAILY.   metFORMIN (GLUCOPHAGE-XR) 500 MG 24 hr tablet, TAKE 1 TABLET BY MOUTH ONCE  DAILY.  Current Outpatient Medications (Cardiovascular):    carvedilol (COREG) 3.125 MG tablet, TAKE 1 TABLET BY MOUTH TWICE DAILY   lisinopril (ZESTRIL) 10 MG tablet, TAKE 1 TABLET BY MOUTH ONCE DAILY.   nitroGLYCERIN (NITROSTAT) 0.4 MG SL tablet, PLACE 1 TABLET (0.4 MG TOTAL) UNDER THE TONGUE EVERY 5 (FIVE) MINUTES AS NEEDED FOR CHEST PAIN.   rosuvastatin (CRESTOR) 40 MG tablet, TAKE 1 TABLET BY MOUTH AT BEDTIME FOR CHOLESTEROL   ezetimibe (ZETIA) 10 MG  tablet, Take 1 tablet (10 mg total) by mouth daily.   Current Outpatient Medications (Analgesics):    acetaminophen (TYLENOL) 500 MG tablet, Take 500 mg by mouth in the morning and at bedtime.  Current Outpatient Medications (Hematological):    clopidogrel (PLAVIX) 75 MG tablet, Take 1 tablet (75 mg total) by mouth daily.   Cyanocobalamin (VITAMIN B-12 IJ), Inject as directed See admin instructions. Every two months  Current Outpatient Medications (Other):    ACCU-CHEK GUIDE test strip, USE AS INSTRUCTED TO CHECK BLOOD SUGAR ONE TO TWO TIMES A DAY.   CRANBERRY PO, Take 1 tablet by mouth daily.   gabapentin (NEURONTIN) 100 MG capsule, Take 1 capsule (100 mg total) by mouth at bedtime as needed (cramps).   Lancets MISC, 1 each by Does not apply route 2 (two) times daily.   Multiple Vitamins-Minerals (MULTIVITAMIN WITH MINERALS) tablet, Take 1 tablet by mouth daily.   OneTouch Delica Lancets 99991111 MISC, USE TO TEST TWICE A DAY   pantoprazole (PROTONIX) 40 MG tablet, TAKE 1 TABLET BY MOUTH ONCE DAILY.   Probiotic Product (ALIGN) 4 MG CAPS, Take 1 capsule by mouth daily.   Reviewed prior external information including notes and imaging from  primary care provider As well as notes that were available from care everywhere and other healthcare systems.  Past medical history, social, surgical and family history all reviewed in electronic medical record.  No pertanent information unless stated regarding to the chief complaint.   Review of  Systems:  No headache, visual changes, nausea, vomiting, diarrhea, constipation, dizziness, abdominal pain, skin rash, fevers, chills, night sweats, weight loss, swollen lymph nodes, body aches, joint swelling, chest pain, shortness of breath, mood changes. POSITIVE muscle aches  Objective  Blood pressure 128/70, pulse (!) 56, height '5\' 3"'$  (1.6 m), SpO2 97 %.   General: No apparent distress alert and oriented x3 mood and affect normal, dressed appropriately.  HEENT: Pupils equal, extraocular movements intact  Respiratory: Patient's speak in full sentences and does not appear short of breath  Cardiovascular: No lower extremity edema, non tender, no erythema  Gait normal with good balance and coordination.  MSK: Left wrist exam shows the patient does have some arthritic changes.  Significant decrease in the swelling as noted previously.  Good grip strength.  Very minimally tender over the TFCC and mild over the [facility] joint.   Limited muscular skeletal ultrasound was performed and interpreted by Hulan Saas, M  Limited musculoskeletal ultrasound shows the patient has no significant changes noted.  Patient has significant arthritic changes noted of the [facility] joint.  Distal radius have some callus formation noted in the area.   Impression and Recommendations:     The above documentation has been reviewed and is accurate and complete Lyndal Pulley, DO

## 2021-05-19 NOTE — Assessment & Plan Note (Signed)
Severe overall.  Discussed with patient which activities to do which wants to avoid.  Patient notes worsening pain can consider injections.  Patient may need a replacement at some point but due to patient's age she would like to avoid.

## 2021-06-07 ENCOUNTER — Ambulatory Visit (INDEPENDENT_AMBULATORY_CARE_PROVIDER_SITE_OTHER): Payer: Medicare Other

## 2021-06-07 ENCOUNTER — Other Ambulatory Visit: Payer: Self-pay

## 2021-06-07 ENCOUNTER — Other Ambulatory Visit (INDEPENDENT_AMBULATORY_CARE_PROVIDER_SITE_OTHER): Payer: Medicare Other

## 2021-06-07 DIAGNOSIS — R252 Cramp and spasm: Secondary | ICD-10-CM

## 2021-06-07 DIAGNOSIS — E538 Deficiency of other specified B group vitamins: Secondary | ICD-10-CM

## 2021-06-07 LAB — CBC WITH DIFFERENTIAL/PLATELET
Basophils Absolute: 0 10*3/uL (ref 0.0–0.1)
Basophils Relative: 0.4 % (ref 0.0–3.0)
Eosinophils Absolute: 0.1 10*3/uL (ref 0.0–0.7)
Eosinophils Relative: 2.4 % (ref 0.0–5.0)
HCT: 34.1 % — ABNORMAL LOW (ref 36.0–46.0)
Hemoglobin: 11.4 g/dL — ABNORMAL LOW (ref 12.0–15.0)
Lymphocytes Relative: 29.1 % (ref 12.0–46.0)
Lymphs Abs: 1.2 10*3/uL (ref 0.7–4.0)
MCHC: 33.5 g/dL (ref 30.0–36.0)
MCV: 89.4 fl (ref 78.0–100.0)
Monocytes Absolute: 0.4 10*3/uL (ref 0.1–1.0)
Monocytes Relative: 10.8 % (ref 3.0–12.0)
Neutro Abs: 2.4 10*3/uL (ref 1.4–7.7)
Neutrophils Relative %: 57.3 % (ref 43.0–77.0)
Platelets: 131 10*3/uL — ABNORMAL LOW (ref 150.0–400.0)
RBC: 3.82 Mil/uL — ABNORMAL LOW (ref 3.87–5.11)
RDW: 12.9 % (ref 11.5–15.5)
WBC: 4.1 10*3/uL (ref 4.0–10.5)

## 2021-06-07 LAB — BASIC METABOLIC PANEL
BUN: 21 mg/dL (ref 6–23)
CO2: 32 mEq/L (ref 19–32)
Calcium: 10.5 mg/dL (ref 8.4–10.5)
Chloride: 104 mEq/L (ref 96–112)
Creatinine, Ser: 0.93 mg/dL (ref 0.40–1.20)
GFR: 57.12 mL/min — ABNORMAL LOW (ref >60.00)
Glucose, Bld: 172 mg/dL — ABNORMAL HIGH (ref 70–99)
Potassium: 4.4 mEq/L (ref 3.5–5.1)
Sodium: 140 mEq/L (ref 135–145)

## 2021-06-07 LAB — IRON: Iron: 100 ug/dL (ref 42–145)

## 2021-06-07 LAB — VITAMIN B12: Vitamin B-12: 985 pg/mL — ABNORMAL HIGH (ref 211–911)

## 2021-06-07 LAB — TSH: TSH: 1.94 u[IU]/mL (ref 0.35–5.50)

## 2021-06-07 MED ORDER — CYANOCOBALAMIN 1000 MCG/ML IJ SOLN
1000.0000 ug | Freq: Once | INTRAMUSCULAR | Status: AC
  Administered 2021-06-07: 1000 ug via INTRAMUSCULAR

## 2021-06-07 NOTE — Progress Notes (Signed)
Per orders of [name], injection of Vitamin B12 given by Diamond Nickel, RN.  Administered to right deltoid IM. Patient tolerated injection well.

## 2021-06-09 ENCOUNTER — Other Ambulatory Visit: Payer: Self-pay | Admitting: Cardiovascular Disease

## 2021-06-13 NOTE — Progress Notes (Signed)
Date:  06/14/2021   ID:  Tracey [name], DOB 1936-08-20, MRN [id number]  Patient Location:  [place] [address]   Provider location:   [facility], [place] office  PCP:  Tracey Greenspan, MD  Cardiologist:  Tracey [name]  Chief Complaint  Patient presents with   6 month follow up     "Doing well." Medications reviewed by the patient verbally.      History of Present Illness:    Tracey [name] is a 85 y.o. female  past medical history of coronary artery disease, bypass surgery in 1995,  diabetes  stent to her graft to the OM several years ago with chest pain January 2013  repeat stenting to the graft to the OM with a Xience 3.5 x 18 mm stent  delivery of a shock for  resuscitation during the procedure),  left renal PCI in 1998, redo PCI March 2010 followed by [name],  History of near syncope spells who presents for routine followup of her coronary artery disease   She  lost her husband in 2016  [place] 11/2020 In follow-up today, she reports having near syncope/syncope episodes Several falls 1 episode reports she was in her Tracey [name], had a "quick dizzy spell", fell face down  Another episode, 6 Am, went to the bathroom, was walking back to bed, had a "flash spell", fell, broke hand  Monday, "flash" spell, felt very dizzy Denies vertigo  She does report occasional palpitations, fast rhythm lasting1-2 minutes Also some SOB with walking, sedentary Feels deconditioned Used to walk on a regular basis, has not been doing so recently  A1c well controlled in the 6 range Reports blood pressure well controlled at home Does not like being on metformin  EKG personally reviewed by myself on todays visit Shows sinus bradycardia rate 55 bpm no significant ST-T wave changes  Followed by [name] 08/25/2020, vascular procedure reviewed and discussed with her in detail Atherosclerotic occlusive disease bilateral lower extremities with rest pain  and lifestyle limiting claudication; recurrent left renal artery stenosis; renovascular hypertension  Percutaneous transluminal angioplasty and stent placement pararenal aorta   Percutaneous transluminal angioplasty and stent placement left renal artery   Carotid Carotid artery ultrasound showed 1-39% RICA stenosis and 27-25% LICA stenosis.   Echo: Normal EF 65% RVSP 32 Mild to moderate MR   Past Medical History:  Diagnosis Date   Anxiety    Arthritis    Atrial fibrillation (HCC)    history   Chronic kidney disease    ckd stage II   Coronary artery disease    Diabetes mellitus    non insulin dependent   Dysrhythmia    atrial fibrillation, history of   GERD (gastroesophageal reflux disease)    Heart murmur    History of kidney stones    Hyperlipidemia    Hypertension    Peripheral vascular disease ([place])    Rectocele    Syncope 09/2019   UTI (urinary tract infection)    Past Surgical History:  Procedure Laterality Date   ABDOMINAL AORTA STENT     APPENDECTOMY     BREAST BIOPSY Left    20 years ago   BREAST BIOPSY Right 2008   BREAST EXCISIONAL BIOPSY     rt 2008 lt "20 years ago"   CARDIAC CATHETERIZATION     CATARACT EXTRACTION W/PHACO Left 08/15/2015   Procedure: CATARACT EXTRACTION PHACO AND INTRAOCULAR LENS PLACEMENT ([place]);  Surgeon: Tracey Cotta,  MD;  Location: [facility] ORS;  Service: Ophthalmology;  Laterality: Left;  [place] 01:28 AP% 26.4 CDE 35.73 fluid pack # [id number]   COLONOSCOPY WITH PROPOFOL N/A 01/16/2016   Procedure: COLONOSCOPY WITH PROPOFOL;  Surgeon: Tracey Silvas, MD;  Location: [facility] ENDOSCOPY;  Service: Endoscopy;  Laterality: N/A;   CORONARY ANGIOPLASTY WITH STENT PLACEMENT     CORONARY ARTERY BYPASS GRAFT  1995   with LIMA  to the LAD, SVG to OM1, SVG to PDA   ESOPHAGOGASTRODUODENOSCOPY (EGD) WITH PROPOFOL N/A 01/16/2016   Procedure: ESOPHAGOGASTRODUODENOSCOPY (EGD) WITH PROPOFOL;  Surgeon: Tracey Silvas, MD;  Location: [facility] ENDOSCOPY;   Service: Endoscopy;  Laterality: N/A;   EYE SURGERY     JOINT REPLACEMENT Right    KNEE ARTHROPLASTY Left 01/06/2020   Procedure: COMPUTER ASSISTED TOTAL KNEE ARTHROPLASTY;  Surgeon: Tracey Leep, MD;  Location: [facility] ORS;  Service: Orthopedics;  Laterality: Left;   LOWER EXTREMITY ANGIOGRAPHY N/A 07/08/2019   Procedure: Abdominal Aortagraphy;  Surgeon: Tracey Huxley, MD;  Location: [place] [facility];  Service: Cardiovascular;  Laterality: N/A;   RENAL ANGIOGRAPHY Left 08/26/2019   Procedure: RENAL ANGIOGRAPHY;  Surgeon: Tracey Huxley, MD;  Location: [place] [facility];  Service: Cardiovascular;  Laterality: Left;   RENAL ARTERY STENT     left, By Dr Tracey [name]   REPLACEMENT TOTAL KNEE  03/25/2013   right   TONSILLECTOMY       Current Meds  Medication Sig   ACCU-CHEK GUIDE test strip USE AS INSTRUCTED TO CHECK BLOOD SUGAR ONE TO TWO TIMES A DAY.   acetaminophen (TYLENOL) 500 MG tablet Take 500 mg by mouth in the morning and at bedtime.   carvedilol (COREG) 3.125 MG tablet TAKE 1 TABLET BY MOUTH TWICE DAILY   clopidogrel (PLAVIX) 75 MG tablet TAKE 1 TABLET BY MOUTH ONCE DAILY.   CRANBERRY PO Take 1 tablet by mouth daily.   Cyanocobalamin (VITAMIN B-12 IJ) Inject as directed See admin instructions. Every two months   ezetimibe (ZETIA) 10 MG tablet Take 1 tablet (10 mg total) by mouth daily.   gabapentin (NEURONTIN) 100 MG capsule Take 1 capsule (100 mg total) by mouth at bedtime as needed (cramps).   glipiZIDE (GLUCOTROL XL) 2.5 MG 24 hr tablet TAKE 1 TABLET BY MOUTH ONCE DAILY.   Lancets MISC 1 each by Does not apply route 2 (two) times daily.   lisinopril (ZESTRIL) 10 MG tablet TAKE 1 TABLET BY MOUTH ONCE DAILY.   metFORMIN (GLUCOPHAGE-XR) 500 MG 24 hr tablet TAKE 1 TABLET BY MOUTH ONCE DAILY.   Multiple Vitamins-Minerals (MULTIVITAMIN WITH MINERALS) tablet Take 1 tablet by mouth daily.   nitroGLYCERIN (NITROSTAT) 0.4 MG SL tablet PLACE 1 TABLET (0.4 MG TOTAL) UNDER THE TONGUE EVERY 5  (FIVE) MINUTES AS NEEDED FOR CHEST PAIN.   OneTouch Delica Lancets 74B MISC USE TO TEST TWICE A DAY   pantoprazole (PROTONIX) 40 MG tablet TAKE 1 TABLET BY MOUTH ONCE DAILY.   Probiotic Product (ALIGN) 4 MG CAPS Take 1 capsule by mouth daily.   rosuvastatin (CRESTOR) 40 MG tablet TAKE 1 TABLET BY MOUTH AT BEDTIME FOR CHOLESTEROL     Allergies:   Codeine, Erythromycin, Tramadol, and Ciprofloxacin    Family Hx: The patient's family history includes Cancer in her brother; Heart disease in her father; Hyperlipidemia in her daughter; Other in her brother, brother, and brother. There is no history of Breast cancer, Kidney cancer, or Bladder Cancer.  ROS:   Please see the history  of present illness.    Review of Systems  Constitutional: Negative.   HENT: Negative.    Respiratory: Negative.    Cardiovascular: Negative.   Gastrointestinal: Negative.   Musculoskeletal: Negative.   Neurological: Negative.   Psychiatric/Behavioral: Negative.    All other systems reviewed and are negative.   Labs/Other Tests and Data Reviewed:    Recent Labs: 04/24/2021: ALT 14 06/07/2021: BUN 21; Creatinine, Ser 0.93; Hemoglobin 11.4; Platelets 131.0; Potassium 4.4; Sodium 140; TSH 1.94   Recent Lipid Panel Lab Results  Component Value Date/Time   CHOL 139 04/24/2021 10:43 AM   CHOL 158 12/12/2020 11:15 AM   CHOL 141 10/08/2011 04:57 AM   TRIG 94 04/24/2021 10:43 AM   TRIG 179 10/08/2011 04:57 AM   HDL 51 04/24/2021 10:43 AM   HDL 52 12/12/2020 11:15 AM   HDL 37 (L) 10/08/2011 04:57 AM   CHOLHDL 2.7 04/24/2021 10:43 AM   LDLCALC 70 04/24/2021 10:43 AM   LDLCALC 68 10/08/2011 04:57 AM    Wt Readings from Last 3 Encounters:  06/14/21 130 lb 8 oz (59.2 kg)  04/26/21 129 lb (58.5 kg)  04/14/21 128 lb 12.8 oz (58.4 kg)     Exam:    BP 140/68 (BP Location: Left Arm, Patient Position: Sitting, Cuff Size: Normal)   Pulse (!) 56   Ht 5\' 3"  (1.6 m)   Wt 130 lb 8 oz (59.2 kg)   SpO2 99%   BMI 23.12  kg/m   Constitutional:  oriented to person, place, and time. No distress.  HENT:  Head: Grossly normal Eyes:  no discharge. No scleral icterus.  Neck: No JVD, no carotid bruits  Cardiovascular: Regular rate and rhythm, no murmurs appreciated Pulmonary/Chest: Clear to auscultation bilaterally, no wheezes or rails Abdominal: Soft.  no distension.  no tenderness.  Musculoskeletal: Normal range of motion Neurological:  normal muscle tone. Coordination normal. No atrophy Skin: Skin warm and dry Psychiatric: normal affect, pleasant   ASSESSMENT & PLAN:    Supraventricular tachycardia Rare short episodes tachycardia Episodes of near syncope, etiology unclear Continue Coreg 3.125 twice daily for now, we did talk about holding beta-blocker for symptomatic bradycardia  Near syncope, syncope, unspecified syncope type Recurrence of her near syncope, etiology unclear, ZIO monitor ordered Unable to exclude sinus arrhythmia, bradycardia, pauses Prior history of vasovagal suspected in the setting of GI upset Recommend she stay hydrated, stand slowly from supine position  PAD (peripheral artery disease) (HCC) Significant aortic disease, renal artery disease Prior intervention by [name],  Lipids at goal, continue aspirin Plavix  Hyperlipidemia Cholesterol is at goal on the current lipid regimen. No changes to the medications were made. Continue Crestor Zetia  Diabetes type 2 with complications P1W improving Watching her diet, more active   Total encounter time more than 25 minutes  Greater than 50% was spent in counseling and coordination of care with the patient   Signed, Ida Rogue, MD  06/14/2021 11:45 AM    [place] [facility] [address], [place], [place] [address]

## 2021-06-14 ENCOUNTER — Other Ambulatory Visit: Payer: Self-pay

## 2021-06-14 ENCOUNTER — Ambulatory Visit (INDEPENDENT_AMBULATORY_CARE_PROVIDER_SITE_OTHER): Payer: Medicare Other

## 2021-06-14 ENCOUNTER — Encounter: Payer: Self-pay | Admitting: Cardiovascular Disease

## 2021-06-14 ENCOUNTER — Ambulatory Visit (INDEPENDENT_AMBULATORY_CARE_PROVIDER_SITE_OTHER): Payer: Medicare Other | Admitting: Cardiovascular Disease

## 2021-06-14 VITALS — BP 140/68 | HR 56 | Ht 63.0 in | Wt 130.5 lb

## 2021-06-14 DIAGNOSIS — I701 Atherosclerosis of renal artery: Secondary | ICD-10-CM

## 2021-06-14 DIAGNOSIS — I471 Supraventricular tachycardia: Secondary | ICD-10-CM

## 2021-06-14 DIAGNOSIS — R001 Bradycardia, unspecified: Secondary | ICD-10-CM

## 2021-06-14 DIAGNOSIS — R55 Syncope and collapse: Secondary | ICD-10-CM

## 2021-06-14 DIAGNOSIS — I10 Essential (primary) hypertension: Secondary | ICD-10-CM | POA: Diagnosis not present

## 2021-06-14 DIAGNOSIS — E0800 Diabetes mellitus due to underlying condition with hyperosmolarity without nonketotic hyperglycemic-hyperosmolar coma (NKHHC): Secondary | ICD-10-CM | POA: Diagnosis not present

## 2021-06-14 DIAGNOSIS — I6522 Occlusion and stenosis of left carotid artery: Secondary | ICD-10-CM | POA: Diagnosis not present

## 2021-06-14 DIAGNOSIS — I739 Peripheral vascular disease, unspecified: Secondary | ICD-10-CM

## 2021-06-14 DIAGNOSIS — I700 Atherosclerosis of aorta: Secondary | ICD-10-CM

## 2021-06-14 DIAGNOSIS — I25118 Atherosclerotic heart disease of native coronary artery with other forms of angina pectoris: Secondary | ICD-10-CM

## 2021-06-14 DIAGNOSIS — R002 Palpitations: Secondary | ICD-10-CM

## 2021-06-14 NOTE — Patient Instructions (Addendum)
Medication Instructions:  No changes  If you need a refill on your cardiac medications before your next appointment, please call your pharmacy.   Lab work: No new labs needed  Testing/Procedures: Heart monitor (Zio patch) for 2 weeks (14 days)   Your physician has recommended that you wear a Zio monitor. This monitor is a medical device that records the heart's electrical activity. Doctors most often use these monitors to diagnose arrhythmias. Arrhythmias are problems with the speed or rhythm of the heartbeat. The monitor is a small device applied to your chest. You can wear one while you do your normal daily activities. While wearing this monitor if you have any symptoms to push the button and record what you felt. Once you have worn this monitor for the period of time provider prescribed (Usually 14 days), you will return the monitor device in the postage paid box. Once it is returned they will download the data collected and provide [place] with a report which the provider will then review and we will call you with those results. Important tips:  Avoid showering during the first 24 hours of wearing the monitor. Avoid excessive sweating to help maximize wear time. Do not submerge the device, no hot tubs, and no swimming pools. Keep any lotions or oils away from the patch. After 24 hours you may shower with the patch on. Take brief showers with your back facing the shower head.  Do not remove patch once it has been placed because that will interrupt data and decrease adhesive wear time. Push the button when you have any symptoms and write down what you were feeling. Once you have completed wearing your monitor, remove and place into box which has postage paid and place in your outgoing mailbox.  If for some reason you have misplaced your box then call our office and we can provide another box and/or mail it off for you.  Follow-Up: At [facility], you and your health needs are our priority.   As part of our continuing mission to provide you with exceptional heart care, we have created designated Provider Care Teams.  These Care Teams include your primary Cardiologist (physician) and Advanced Practice Providers (APPs -  Physician Assistants and Nurse Practitioners) who all work together to provide you with the care you need, when you need it.  You will need a follow up appointment in 6 months  Providers on your designated Care Team:   Murray Hodgkins, NP Christell Faith, PA-C Marrianne Mood, PA-C Cadence Talmo, [place]  COVID-19 Vaccine Information can be found at: [email or web address] For questions related to vaccine distribution or appointments, please email [email or web address] or call [phone].

## 2021-06-15 DIAGNOSIS — R001 Bradycardia, unspecified: Secondary | ICD-10-CM | POA: Diagnosis not present

## 2021-06-15 DIAGNOSIS — R55 Syncope and collapse: Secondary | ICD-10-CM | POA: Diagnosis not present

## 2021-06-19 ENCOUNTER — Other Ambulatory Visit: Payer: Medicare Other

## 2021-06-23 ENCOUNTER — Ambulatory Visit: Payer: Medicare Other | Admitting: Family Medicine

## 2021-06-23 ENCOUNTER — Other Ambulatory Visit: Payer: Self-pay | Admitting: Family Medicine

## 2021-06-28 DIAGNOSIS — R55 Syncope and collapse: Secondary | ICD-10-CM | POA: Diagnosis not present

## 2021-06-28 DIAGNOSIS — R001 Bradycardia, unspecified: Secondary | ICD-10-CM | POA: Diagnosis not present

## 2021-07-14 ENCOUNTER — Telehealth: Payer: Self-pay | Admitting: Cardiovascular Disease

## 2021-07-14 NOTE — Telephone Encounter (Signed)
Patient calling  Would like to check the status of monitor results States she sent it in a while ago and has not heard anything  Please call to discuss

## 2021-07-14 NOTE — Telephone Encounter (Signed)
Able to reach back out to Tracey [name] regarding her Zio monitor worn a few weeks ago.   Dr. Rockey Situ has bot reviewed her ZIO results at this time, results are in his results basket to review.   Preliminary results reviewed by nurse to pt.  Patch Wear Time:  14 days and 0 hours (2022-09-28T12:27:45-0400 to 2022-10-12T12:27:45-0400)   Patient had a min HR of 43 bpm, max HR of 106 bpm, and avg HR of 66 bpm. Predominant underlying rhythm was Sinus Rhythm. First Degree AV Block was present. Isolated SVEs were occasional (4.4%, 57880), SVE Couplets were rare (<1.0%, 1141), and no SVE  Triplets were present. Isolated VEs were rare (<1.0%), and no VE Couplets or VE Triplets were present.  Tracey [name] thankful for returning her call and reviewing her preliminary results, will await for return call of actual results reviewed by Dr. Rockey Situ.

## 2021-07-19 NOTE — Telephone Encounter (Signed)
Patient is calling to get monitor results.

## 2021-07-19 NOTE — Telephone Encounter (Signed)
Return call to Mrs. Tracey [name], advised Dr. Rockey Situ has not read her ZIO studies at this time, nurse will call back once read and with any recommendations. [name] verbalized understanding and thankful for calling back.

## 2021-07-24 ENCOUNTER — Telehealth: Payer: Self-pay

## 2021-07-24 NOTE — Telephone Encounter (Signed)
Was able to reach out to Tracey [name] via phone and make contact to review her recent ZIO monitor results. Dr. Rockey Situ advised based on the current results   Event monitor  Normal rhythm, no significant arrhythmia  Details as below    Patch Wear Time:  14 days and 0 hours     Normal sinus rhythm  Patient had a min HR of 43 bpm, max HR of 106 bpm, and avg HR of 66 bpm.     Isolated SVEs were occasional (4.4%, 57880), SVE Couplets were rare (<1.0%, 1141), and no SVE Triplets were present.     Isolated VEs were rare (<1.0%), and no VE Couplets or VE Triplets were present.     Patient triggered event not associated with significant arrhythmia   Pt verbalized understanding, is thankful for the results call, all questions and concerns were address. Will call back for anything further, f/u as schedule.

## 2021-08-02 ENCOUNTER — Other Ambulatory Visit: Payer: Self-pay | Admitting: Nurse Practitioner

## 2021-08-02 ENCOUNTER — Other Ambulatory Visit: Payer: Self-pay | Admitting: Cardiovascular Disease

## 2021-08-16 ENCOUNTER — Encounter: Payer: Self-pay | Admitting: Family

## 2021-08-16 ENCOUNTER — Ambulatory Visit (INDEPENDENT_AMBULATORY_CARE_PROVIDER_SITE_OTHER): Payer: Medicare Other | Admitting: Family

## 2021-08-16 ENCOUNTER — Other Ambulatory Visit: Payer: Self-pay

## 2021-08-16 VITALS — BP 122/68 | HR 60 | Temp 97.6°F | Ht 63.0 in | Wt 131.6 lb

## 2021-08-16 DIAGNOSIS — N3000 Acute cystitis without hematuria: Secondary | ICD-10-CM | POA: Insufficient documentation

## 2021-08-16 DIAGNOSIS — E1122 Type 2 diabetes mellitus with diabetic chronic kidney disease: Secondary | ICD-10-CM

## 2021-08-16 DIAGNOSIS — N182 Chronic kidney disease, stage 2 (mild): Secondary | ICD-10-CM | POA: Diagnosis not present

## 2021-08-16 DIAGNOSIS — R309 Painful micturition, unspecified: Secondary | ICD-10-CM | POA: Insufficient documentation

## 2021-08-16 DIAGNOSIS — R350 Frequency of micturition: Secondary | ICD-10-CM

## 2021-08-16 LAB — URINALYSIS, ROUTINE W REFLEX MICROSCOPIC
Bilirubin Urine: NEGATIVE
Hgb urine dipstick: NEGATIVE
Nitrite: NEGATIVE
Specific Gravity, Urine: 1.02 (ref 1.000–1.030)
Total Protein, Urine: NEGATIVE
Urine Glucose: NEGATIVE
Urobilinogen, UA: 1 (ref 0.0–1.0)
pH: 5.5 (ref 5.0–8.0)

## 2021-08-16 LAB — POCT URINALYSIS DIP (MANUAL ENTRY)
Bilirubin, UA: NEGATIVE
Blood, UA: NEGATIVE
Glucose, UA: NEGATIVE mg/dL
Nitrite, UA: POSITIVE — AB
Spec Grav, UA: 1.015 (ref 1.010–1.025)
Urobilinogen, UA: 1 E.U./dL
pH, UA: 6 (ref 5.0–8.0)

## 2021-08-16 MED ORDER — SULFAMETHOXAZOLE-TRIMETHOPRIM 800-160 MG PO TABS: 1.0000 | ORAL_TABLET | Freq: Two times a day (BID) | ORAL | 0 refills | Status: AC

## 2021-08-16 NOTE — Patient Instructions (Signed)
It was a pleasure seeing you today.   You were found to have a urinary tract infection, you have been prescribed an antibiotic to your preferred pharmacy. Please start antibiotic today as directed.   We are sending your urine for a culture to make sure you do not have a resistant bacteria. We will call you if we need to change your medications.   Please make sure you are drinking plenty of fluids over the next few days.  If your symptoms do not improve over the next 5-7 days, or if they worsen, please let us know. Please also let us know if you have worsening back pain, fevers, chills, or body aches.   Regards,   Krystel Fletchall  

## 2021-08-16 NOTE — Assessment & Plan Note (Signed)
antibiotic prescribed, pt to take as directed. If any worsening symptoms please f/u . If fever/chills/nausea/vomiting please notify me immediately and or go to er. increase oral fluids. Urine culture ordered, pending results.

## 2021-08-16 NOTE — Assessment & Plan Note (Signed)
Continue to monitor blood sugars at home, pt advised sugar can increase with an infection such as UTI

## 2021-08-16 NOTE — Assessment & Plan Note (Signed)
Urinary culture ordered, pending results. Dipstick completed at this visit

## 2021-08-16 NOTE — Progress Notes (Signed)
Established Patient Office Visit  Subjective:  Patient ID: Tracey [name], female    DOB: 1936/01/16  Age: 85 y.o. MRN: [id number]  CC:  Chief Complaint  Patient presents with   Urinary Tract Infection    HPI Tracey [name] is here today with c/o urinary frequency (about every two hours), urinary odor foul, urinary urgency and also slight dysuria. She just doesn't feel 'right' tired more than often. Lower abdominal tenderness. Does not see blood in urine.   No flank pain. No fever. Some chills last night. No vaginal discharge.  Sx have been for the past one week. Concerned because sugars are running high at home, glucose this am was 179. Normal fasting glucose is 115-130.   Past Medical History:  Diagnosis Date   Anxiety    Arthritis    Atrial fibrillation ([place])    history   Chronic kidney disease    ckd stage II   Coronary artery disease    Diabetes mellitus    non insulin dependent   Dysrhythmia    atrial fibrillation, history of   GERD (gastroesophageal reflux disease)    Heart murmur    History of kidney stones    Hyperlipidemia    Hypertension    Peripheral vascular disease ([place])    Rectocele    Syncope 09/2019   UTI (urinary tract infection)     Past Surgical History:  Procedure Laterality Date   ABDOMINAL AORTA STENT     APPENDECTOMY     BREAST BIOPSY Left    20 years ago   BREAST BIOPSY Right 2008   BREAST EXCISIONAL BIOPSY     rt 2008 lt "20 years ago"   CARDIAC CATHETERIZATION     CATARACT EXTRACTION W/PHACO Left 08/15/2015   Procedure: CATARACT EXTRACTION PHACO AND INTRAOCULAR LENS PLACEMENT ([place]);  Surgeon: Estill Cotta, MD;  Location: [facility] ORS;  Service: Ophthalmology;  Laterality: Left;  [place] 01:28 AP% 26.4 CDE 35.73 fluid pack # [id number]   COLONOSCOPY WITH PROPOFOL N/A 01/16/2016   Procedure: COLONOSCOPY WITH PROPOFOL;  Surgeon: Manya Silvas, MD;  Location: [facility] ENDOSCOPY;  Service: Endoscopy;  Laterality: N/A;   CORONARY ANGIOPLASTY  WITH STENT PLACEMENT     CORONARY ARTERY BYPASS GRAFT  1995   with LIMA  to the LAD, SVG to OM1, SVG to PDA   ESOPHAGOGASTRODUODENOSCOPY (EGD) WITH PROPOFOL N/A 01/16/2016   Procedure: ESOPHAGOGASTRODUODENOSCOPY (EGD) WITH PROPOFOL;  Surgeon: Manya Silvas, MD;  Location: [facility] ENDOSCOPY;  Service: Endoscopy;  Laterality: N/A;   EYE SURGERY     JOINT REPLACEMENT Right    KNEE ARTHROPLASTY Left 01/06/2020   Procedure: COMPUTER ASSISTED TOTAL KNEE ARTHROPLASTY;  Surgeon: Dereck Leep, MD;  Location: [facility] ORS;  Service: Orthopedics;  Laterality: Left;   LOWER EXTREMITY ANGIOGRAPHY N/A 07/08/2019   Procedure: Abdominal Aortagraphy;  Surgeon: Algernon Huxley, MD;  Location: [facility] CV LAB;  Service: Cardiovascular;  Laterality: N/A;   RENAL ANGIOGRAPHY Left 08/26/2019   Procedure: RENAL ANGIOGRAPHY;  Surgeon: Algernon Huxley, MD;  Location: [facility] LAB;  Service: Cardiovascular;  Laterality: Left;   RENAL ARTERY STENT     left, By Dr Hulda Humphrey   REPLACEMENT TOTAL KNEE  03/25/2013   right   TONSILLECTOMY      Family History  Problem Relation Age of Onset   Heart disease Father    Other Brother        open heart surgery   Cancer Brother    Other  Brother        open heart surgery   Other Brother        open heart surgery   Hyperlipidemia Daughter    Breast cancer Neg Hx    Kidney cancer Neg Hx    Bladder Cancer Neg Hx     Social History   Socioeconomic History   Marital status: Widowed    Spouse name: Not on file   Number of children: Not on file   Years of education: Not on file   Highest education level: Not on file  Occupational History   Occupation: school cafeteria    Comment: retired  Tobacco Use   Smoking status: Never   Smokeless tobacco: Never  Vaping Use   Vaping Use: Never used  Substance and Sexual Activity   Alcohol use: No   Drug use: No   Sexual activity: Never  Other Topics Concern   Not on file  Social History Narrative   Patient has 3  daughters. Remarried June 2021.   Social Determinants of Health   Financial Resource Strain: Not on file  Food Insecurity: Not on file  Transportation Needs: Not on file  Physical Activity: Not on file  Stress: Not on file  Social Connections: Not on file  Intimate Partner Violence: Not on file    Outpatient Medications Prior to Visit  Medication Sig Dispense Refill   ACCU-CHEK GUIDE test strip USE AS INSTRUCTED TO [place]. 100 strip 2   acetaminophen (TYLENOL) 500 MG tablet Take 500 mg by mouth in the morning and at bedtime.     carvedilol (COREG) 3.125 MG tablet TAKE 1 TABLET BY MOUTH TWICE DAILY 180 tablet 0   clopidogrel (PLAVIX) 75 MG tablet TAKE 1 TABLET BY MOUTH ONCE DAILY. 90 tablet 0   CRANBERRY PO Take 1 tablet by mouth daily.     Cyanocobalamin (VITAMIN B-12 IJ) Inject as directed See admin instructions. Every two months     gabapentin (NEURONTIN) 100 MG capsule Take 1 capsule (100 mg total) by mouth at bedtime as needed (cramps). 90 capsule 0   glipiZIDE (GLUCOTROL XL) 2.5 MG 24 hr tablet TAKE 1 TABLET BY MOUTH ONCE DAILY. 90 tablet 0   Lancets MISC 1 each by Does not apply route 2 (two) times daily. 100 each 12   lisinopril (ZESTRIL) 10 MG tablet TAKE 1 TABLET BY MOUTH ONCE DAILY. 90 tablet 0   metFORMIN (GLUCOPHAGE-XR) 500 MG 24 hr tablet TAKE 1 TABLET BY MOUTH ONCE DAILY. 90 tablet 0   Multiple Vitamins-Minerals (MULTIVITAMIN WITH MINERALS) tablet Take 1 tablet by mouth daily.     nitroGLYCERIN (NITROSTAT) 0.4 MG SL tablet PLACE 1 TABLET (0.4 MG TOTAL) UNDER THE TONGUE EVERY 5 (FIVE) MINUTES AS NEEDED FOR CHEST PAIN. 25 tablet 0   OneTouch Delica Lancets 66Z MISC USE TO TEST TWICE A DAY 100 each 1   pantoprazole (PROTONIX) 40 MG tablet TAKE 1 TABLET BY MOUTH ONCE DAILY. 90 tablet 1   Probiotic Product (ALIGN) 4 MG CAPS Take 1 capsule by mouth daily.     rosuvastatin (CRESTOR) 40 MG tablet TAKE 1 TABLET BY MOUTH AT BEDTIME FOR CHOLESTEROL  90 tablet 0   ezetimibe (ZETIA) 10 MG tablet Take 1 tablet (10 mg total) by mouth daily. 90 tablet 3   No facility-administered medications prior to visit.    Allergies  Allergen Reactions   Codeine Nausea Only    Sick to her stomach  Erythromycin Nausea And Vomiting and Other (See Comments)   Tramadol Nausea Only and Other (See Comments)    Loss of appetite and did not eat   Ciprofloxacin Diarrhea    Upsets stomach    ROS Review of Systems  Constitutional:  Positive for chills. Negative for fever.  Gastrointestinal:  Negative for abdominal pain (lower abdominal tenderness).  Genitourinary:  Positive for dysuria, frequency (every two hours) and urgency. Negative for difficulty urinating, flank pain, hematuria, pelvic pain and vaginal discharge.  Musculoskeletal:  Negative for arthralgias and back pain.     Objective:    Physical Exam Constitutional:      General: She is not in acute distress.    Appearance: Normal appearance. She is well-developed, well-groomed and normal weight. She is not ill-appearing.  HENT:     Mouth/Throat:     Pharynx: No pharyngeal swelling.     Tonsils: No tonsillar exudate.  Neck:     Thyroid: No thyroid mass.  Pulmonary:     Effort: Pulmonary effort is normal.  Abdominal:     Tenderness: There is abdominal tenderness (mild suprapubic tenderness on palpation).  Musculoskeletal:     Lumbar back: Normal. No tenderness.  Lymphadenopathy:     Cervical:     Right cervical: No superficial cervical adenopathy.    Left cervical: No superficial cervical adenopathy.  Neurological:     Mental Status: She is alert.    BP 122/68   Pulse 60   Temp 97.6 F (36.4 C) (Temporal)   Ht 5\' 3"  (1.6 m)   Wt 131 lb 9.6 oz (59.7 kg)   SpO2 98%   BMI 23.31 kg/m  Wt Readings from Last 3 Encounters:  08/16/21 131 lb 9.6 oz (59.7 kg)  06/14/21 130 lb 8 oz (59.2 kg)  04/26/21 129 lb (58.5 kg)     Health Maintenance Due  Topic Date Due   Zoster  Vaccines- Shingrix (1 of 2) Never done   FOOT EXAM  03/31/2020   COVID-19 Vaccine (4 - Booster for Moderna series) 09/26/2020    There are no preventive care reminders to display for this patient.  Lab Results  Component Value Date   TSH 1.94 06/07/2021   Lab Results  Component Value Date   WBC 4.1 06/07/2021   HGB 11.4 (L) 06/07/2021   HCT 34.1 (L) 06/07/2021   MCV 89.4 06/07/2021   PLT 131.0 (L) 06/07/2021   Lab Results  Component Value Date   NA 140 06/07/2021   K 4.4 06/07/2021   CO2 32 06/07/2021   GLUCOSE 172 (H) 06/07/2021   BUN 21 06/07/2021   CREATININE 0.93 06/07/2021   BILITOT 0.6 04/24/2021   ALKPHOS 51 10/21/2020   AST 23 04/24/2021   ALT 14 04/24/2021   PROT 6.7 04/24/2021   ALBUMIN 4.5 10/21/2020   CALCIUM 10.5 06/07/2021   ANIONGAP 7 12/29/2019   GFR 57.12 (L) 06/07/2021   Lab Results  Component Value Date   HGBA1C 7.0 (A) 04/26/2021      Assessment & Plan:   Problem List Items Addressed This Visit       Endocrine   Type 2 diabetes mellitus with stage 2 chronic kidney disease, without long-term current use of insulin ([place])    Continue to monitor blood sugars at home, pt advised sugar can increase with an infection such as UTI        Genitourinary   Acute cystitis without hematuria    antibiotic prescribed, pt to take as  directed. If any worsening symptoms please f/u . If fever/chills/nausea/vomiting please notify me immediately and or go to er. increase oral fluids. Urine culture ordered, pending results.       Relevant Medications   sulfamethoxazole-trimethoprim (BACTRIM DS) 800-160 MG tablet     Other   Urine frequency    Urinary culture ordered, pending results. Dipstick completed at this visit      Painful urination - Primary   Relevant Orders   POCT urinalysis dipstick (Completed)   Urinalysis, Routine w reflex microscopic   Urine Culture    Meds ordered this encounter  Medications   sulfamethoxazole-trimethoprim  (BACTRIM DS) 800-160 MG tablet    Sig: Take 1 tablet by mouth 2 (two) times daily for 5 days.    Dispense:  10 tablet    Refill:  0    Order Specific Question:   Supervising Provider    Answer:   Diona Browner, AMY E [[id number]]    Follow-up: Return in about 2 days (around 08/18/2021), or if symptoms worsen or fail to improve.    Tracey Pancoast, Tracey [name]

## 2021-08-18 LAB — URINE CULTURE
MICRO NUMBER:: 12696160
SPECIMEN QUALITY:: ADEQUATE

## 2021-08-22 NOTE — Progress Notes (Signed)
Subjective:   Tracey [name] is a 85 y.o. female who presents for Medicare Annual (Subsequent) preventive examination.  I connected with Tracey [name] today by telephone and verified that I am speaking with the correct person using two identifiers. Location patient: home Location provider: work Persons participating in the virtual visit: patient, Marine scientist.    I discussed the limitations, risks, security and privacy concerns of performing an evaluation and management service by telephone and the availability of in person appointments. I also discussed with the patient that there may be a patient responsible charge related to this service. The patient expressed understanding and verbally consented to this telephonic visit.    Interactive audio and video telecommunications were attempted between this provider and patient, however failed, due to patient having technical difficulties OR patient did not have access to video capability.  We continued and completed visit with audio only.  Some vital signs may be absent or patient reported.   Time Spent with patient on telephone encounter: 20 minutes  Review of Systems     Cardiac Risk Factors include: advanced age (>26men, >64 women);hypertension;diabetes mellitus;dyslipidemia     Objective:    Today's Vitals   08/24/21 1359  Weight: 131 lb (59.4 kg)  Height: 5\' 3"  (1.6 m)   Body mass index is 23.21 kg/m.  Advanced Directives 08/24/2021 01/06/2020 01/06/2020 12/25/2019 08/26/2019 07/08/2019 10/06/2018  Does Patient Have a Medical Advance Directive? Yes Yes No No Yes Yes No  Type of Paramedic of [place];Living will [place];Living will [place];Living will -  Does patient want to make changes to medical advance directive? Yes (MAU/Ambulatory/Procedural Areas - Information given) Yes (Inpatient - patient defers changing a medical advance directive  and declines information at this time) - - - No - Patient declined -  Copy of [place] in Chart? - No - copy requested - - - - -  Would patient like information on creating a medical advance directive? - Yes (MAU/Ambulatory/Procedural Areas - Information given) - Yes (MAU/Ambulatory/Procedural Areas - Information given) - No - Patient declined -    Current Medications (verified) Outpatient Encounter Medications as of 08/24/2021  Medication Sig   ACCU-CHEK GUIDE test strip USE AS INSTRUCTED TO CHECK BLOOD SUGAR ONE TO TWO TIMES A DAY.   acetaminophen (TYLENOL) 500 MG tablet Take 500 mg by mouth in the morning and at bedtime.   carvedilol (COREG) 3.125 MG tablet TAKE 1 TABLET BY MOUTH TWICE DAILY   clopidogrel (PLAVIX) 75 MG tablet TAKE 1 TABLET BY MOUTH ONCE DAILY.   CRANBERRY PO Take 1 tablet by mouth daily.   Cyanocobalamin (VITAMIN B-12 IJ) Inject as directed See admin instructions. Every two months   gabapentin (NEURONTIN) 100 MG capsule TAKE (1) CAPSULE DAILY AT BEDTIME AS NEEDED FOR CRAMPS.   glipiZIDE (GLUCOTROL XL) 2.5 MG 24 hr tablet TAKE 1 TABLET BY MOUTH ONCE DAILY.   Lancets MISC 1 each by Does not apply route 2 (two) times daily.   lisinopril (ZESTRIL) 10 MG tablet TAKE 1 TABLET BY MOUTH ONCE DAILY.   metFORMIN (GLUCOPHAGE-XR) 500 MG 24 hr tablet TAKE 1 TABLET BY MOUTH ONCE DAILY.   Multiple Vitamins-Minerals (MULTIVITAMIN WITH MINERALS) tablet Take 1 tablet by mouth daily.   nitroGLYCERIN (NITROSTAT) 0.4 MG SL tablet PLACE 1 TABLET (0.4 MG TOTAL) UNDER THE TONGUE EVERY 5 (FIVE) MINUTES AS NEEDED FOR CHEST PAIN.   OneTouch Delica Lancets  33G MISC USE TO TEST TWICE A DAY   pantoprazole (PROTONIX) 40 MG tablet TAKE 1 TABLET BY MOUTH ONCE DAILY.   Probiotic Product (ALIGN) 4 MG CAPS Take 1 capsule by mouth daily.   rosuvastatin (CRESTOR) 40 MG tablet TAKE 1 TABLET BY MOUTH AT BEDTIME FOR CHOLESTEROL   ezetimibe (ZETIA) 10 MG tablet Take 1 tablet (10 mg total) by  mouth daily.   [DISCONTINUED] gabapentin (NEURONTIN) 100 MG capsule Take 1 capsule (100 mg total) by mouth at bedtime as needed (cramps).   No facility-administered encounter medications on file as of 08/24/2021.    Allergies (verified) Codeine, Erythromycin, Tramadol, and Ciprofloxacin   History: Past Medical History:  Diagnosis Date   Anxiety    Arthritis    Atrial fibrillation ([place])    history   Chronic kidney disease    ckd stage II   Coronary artery disease    Diabetes mellitus    non insulin dependent   Dysrhythmia    atrial fibrillation, history of   GERD (gastroesophageal reflux disease)    Heart murmur    History of kidney stones    Hyperlipidemia    Hypertension    Peripheral vascular disease ([place])    Rectocele    Syncope 09/2019   UTI (urinary tract infection)    Past Surgical History:  Procedure Laterality Date   ABDOMINAL AORTA STENT     APPENDECTOMY     BREAST BIOPSY Left    20 years ago   BREAST BIOPSY Right 2008   BREAST EXCISIONAL BIOPSY     rt 2008 lt "20 years ago"   CARDIAC CATHETERIZATION     CATARACT EXTRACTION W/PHACO Left 08/15/2015   Procedure: CATARACT EXTRACTION PHACO AND INTRAOCULAR LENS PLACEMENT ([place]);  Surgeon: Estill Cotta, MD;  Location: [facility] ORS;  Service: Ophthalmology;  Laterality: Left;  [place] 01:28 AP% 26.4 CDE 35.73 fluid pack # [id number]   COLONOSCOPY WITH PROPOFOL N/A 01/16/2016   Procedure: COLONOSCOPY WITH PROPOFOL;  Surgeon: Manya Silvas, MD;  Location: [facility] ENDOSCOPY;  Service: Endoscopy;  Laterality: N/A;   CORONARY ANGIOPLASTY WITH STENT PLACEMENT     CORONARY ARTERY BYPASS GRAFT  1995   with LIMA  to the LAD, SVG to OM1, SVG to PDA   ESOPHAGOGASTRODUODENOSCOPY (EGD) WITH PROPOFOL N/A 01/16/2016   Procedure: ESOPHAGOGASTRODUODENOSCOPY (EGD) WITH PROPOFOL;  Surgeon: Manya Silvas, MD;  Location[facility]COPY;  Service: Endoscopy;  Laterality: N/A;   EYE SURGERY     JOINT REPLACEMENT Right    KNEE ARTHROPLASTY  Left 01/06/2020   Procedure: COMPUTER ASSISTED TOTAL KNEE ARTHROPLASTY;  Surgeon: Dereck Leep, MD;  Location: [facility] ORS;  Service: Orthopedics;  Laterality: Left;   LOWER EXTREMITY ANGIOGRAPHY N/A 07/08/2019   Procedure: Abdominal Aortagraphy;  Surgeon: Algernon Huxley, MD;  Location: [place] CV LAB;  Service: Cardiovascular;  Laterality: N/A;   RENAL ANGIOGRAPHY Left 08/26/2019   Procedure: RENAL ANGIOGRAPHY;  Surgeon: Algernon Huxley, MD;  Location: [place] CV LAB;  Service: Cardiovascular;  Laterality: Left;   RENAL ARTERY STENT     left, By Dr Hulda Humphrey   REPLACEMENT TOTAL KNEE  03/25/2013   right   TONSILLECTOMY     Family History  Problem Relation Age of Onset   Heart disease Father    Other Brother        open heart surgery   Cancer Brother    Other Brother        open heart surgery   Other Brother  open heart surgery   Hyperlipidemia Daughter    Breast cancer Neg Hx    Kidney cancer Neg Hx    Bladder Cancer Neg Hx    Social History   Socioeconomic History   Marital status: Married    Spouse name: Not on file   Number of children: Not on file   Years of education: Not on file   Highest education level: Not on file  Occupational History   Occupation: school cafeteria    Comment: retired  Tobacco Use   Smoking status: Never   Smokeless tobacco: Never  Vaping Use   Vaping Use: Never used  Substance and Sexual Activity   Alcohol use: No   Drug use: No   Sexual activity: Never  Other Topics Concern   Not on file  Social History Narrative   Patient has 3 daughters. Remarried June 2021.   Social Determinants of Health   Financial Resource Strain: Low Risk    Difficulty of Paying Living Expenses: Not hard at all  Food Insecurity: No Food Insecurity   Worried About Charity fundraiser in the Last Year: Never true   [place] in the Last Year: Never true  Transportation Needs: No Transportation Needs   Lack of Transportation (Medical): No   Lack  of Transportation (Non-Medical): No  Physical Activity: Insufficiently Active   Days of Exercise per Week: 7 days   Minutes of Exercise per Session: 10 min  Stress: No Stress Concern Present   Feeling of Stress : Not at all  Social Connections: Socially Integrated   Frequency of Communication with Friends and Family: More than three times a week   Frequency of Social Gatherings with Friends and Family: More than three times a week   Attends Religious Services: More than 4 times per year   Active Member of [employer] or Organizations: Yes   Attends Music therapist: More than 4 times per year   Marital Status: Married    Tobacco Counseling Counseling given: Not Answered   Clinical Intake:  Pre-visit preparation completed: Yes  Pain : No/denies pain     BMI - recorded: 23.32 Nutritional Status: BMI <19  Underweight Nutritional Risks: None Diabetes: Yes CBG done?: No (visit completed over the phone) Did pt. bring in CBG monitor from home?: No  How often do you need to have someone help you when you read instructions, pamphlets, or other written materials from your doctor or pharmacy?: 1 - Never  Diabetes:  Is the patient diabetic?  Yes  If diabetic, was a CBG obtained today?  No , visit completed over the phone. Did the patient bring in their glucometer from home?  No , visit completed over the phone. How often do you monitor your CBG's? daily.   Financial Strains and Diabetes Management:  Are you having any financial strains with the device, your supplies or your medication? No .  Does the patient want to be seen by Chronic Care Management for management of their diabetes?  No  Would the patient like to be referred to a Nutritionist or for Diabetic Management?  No   Diabetic Exams:  Diabetic Eye Exam: Completed 05/03/21.   Diabetic Foot Exam:  due, patient has upcoming appointment scheduled with PCP.   Interpreter Needed?: No  Information entered by ::  Orrin Brigham LPN   Activities of Daily Living In your present state of health, do you have any difficulty performing the following activities: 08/24/2021 04/14/2021  Hearing? N N  Vision? N N  Difficulty concentrating or making decisions? N N  Walking or climbing stairs? N N  Dressing or bathing? N N  Doing errands, shopping? Y N  Comment husband or daughter [place] and eating ? N -  Using the Toilet? N -  In the past six months, have you accidently leaked urine? N -  Do you have problems with loss of bowel control? N -  Managing your Medications? N -  Managing your Finances? N -  Housekeeping or managing your Housekeeping? N -  Some recent data might be hidden    Patient Care Team: Tower, Wynelle Fanny, MD as PCP - General (Family Medicine) Manya Silvas, MD (Inactive) (Gastroenterology) Minna Merritts, MD as Consulting Physician (Cardiology) Dingeldein, Remo Lipps, MD as Consulting Physician (Ophthalmology)  Indicate any recent Medical Services you may have received from other than [facility] providers in the past year (date may be approximate).     Assessment:   This is a routine wellness examination for Tracey [name].  Hearing/Vision screen Hearing Screening - Comments:: No issues Vision Screening - Comments:: Last exam 2022, Dr. Jeni Salles @ [facility], wears readers  Dietary issues and exercise activities discussed: Current Exercise Habits: Home exercise routine, Type of exercise: walking;stretching;strength training/weights, Time (Minutes): 10, Frequency (Times/Week): 7, Weekly Exercise (Minutes/Week): 70, Intensity: Mild   Goals Addressed             This Visit's Progress    Patient Stated       Would like to try and walk more       Depression Screen PHQ 2/9 Scores 08/24/2021 04/14/2021 04/01/2019 11/06/2017 10/26/2016  PHQ - 2 Score 0 0 0 2 0  PHQ- 9 Score - - - 10 -    Fall Risk Fall Risk  08/24/2021 04/14/2021 05/07/2019 04/01/2019 11/06/2017   Falls in the past year? 1 1 0 0 No  Comment - - - - -  Number falls in past yr: 0 0 - 0 -  Injury with Fall? 0 0 - - -  Risk for fall due to : Other (Comment) - - - -  Risk for fall due to: Comment blacked out , seeing cardiologist - - - -  Follow up Falls prevention discussed - Falls evaluation completed - -    FALL RISK PREVENTION PERTAINING TO THE HOME:  Any stairs in or around the home? Yes  If so, are there any without handrails? No  Home free of loose throw rugs in walkways, pet beds, electrical cords, etc? Yes  Adequate lighting in your home to reduce risk of falls? Yes   ASSISTIVE DEVICES UTILIZED TO PREVENT FALLS:  Life alert? No  Use of a cane, walker or w/c? No  Grab bars in the bathroom? Yes  Shower chair or bench in shower? No  Elevated toilet seat or a handicapped toilet? No   TIMED UP AND GO:  Was the test performed? No , visit completed over the phone.    Cognitive Function: Normal cognitive status assessed by this Nurse Health Advisor. No abnormalities found.   MMSE - Mini Mental State Exam 11/06/2017 10/26/2016  Orientation to time 5 5  Orientation to Place 5 5  Registration 3 3  Attention/ Calculation 0 0  Recall 3 3  Language- name 2 objects 0 0  Language- repeat 1 1  Language- follow 3 step command 3 3  Language- read & follow direction 0 0  Write a sentence 0 0  Copy design 0 0  Total score 20 20        Immunizations Immunization History  Administered Date(s) Administered   Fluad Quad(high Dose 65+) 06/15/2019, 09/26/2020   Influenza Split 08/10/2015   Influenza,inj,Quad PF,6+ Mos 05/24/2016, 07/26/2017, 06/03/2018, 07/11/2021   Influenza-Unspecified 08/10/2015, 05/24/2016   Moderna Sars-Covid-2 Vaccination 10/02/2019, 10/30/2019, 08/01/2020   Pneumococcal Conjugate-13 11/05/2016   Pneumococcal Polysaccharide-23 12/11/2017   Td 03/17/2013    TDAP status: Up to date  Flu Vaccine status: Up to date  Pneumococcal vaccine status: Up  to date  Covid-19 vaccine status: Information provided on how to obtain vaccines.   Qualifies for Shingles Vaccine? Yes   Zostavax completed No   Shingrix Completed?: No.    Education has been provided regarding the importance of this vaccine. Patient has been advised to call insurance company to determine out of pocket expense if they have not yet received this vaccine. Advised may also receive vaccine at local pharmacy or Health Dept. Verbalized acceptance and understanding.  Screening Tests Health Maintenance  Topic Date Due   Zoster Vaccines- Shingrix (1 of 2) Never done   FOOT EXAM  03/31/2020   COVID-19 Vaccine (4 - Booster for Moderna series) 09/26/2020   HEMOGLOBIN A1C  10/27/2021   OPHTHALMOLOGY EXAM  05/03/2022   TETANUS/TDAP  03/18/2023   Pneumonia Vaccine 59+ Years old  Completed   INFLUENZA VACCINE  Completed   DEXA SCAN  Completed   HPV VACCINES  Aged Out    Health Maintenance  Health Maintenance Due  Topic Date Due   Zoster Vaccines- Shingrix (1 of 2) Never done   FOOT EXAM  03/31/2020   COVID-19 Vaccine (4 - Booster for Moderna series) 09/26/2020    Colorectal cancer screening: No longer required.   Mammogram Status: due, last completed 12/09/17, patient will discuss with PCP if decided  Bone Density status: Ordered 05/05/21. Pt provided with contact info and advised to call to schedule appt.  Lung Cancer Screening: (Low Dose CT Chest recommended if Age 67-80 years, 30 pack-year currently smoking OR have quit w/in 15years.) does not qualify.     Additional Screening:  Hepatitis C Screening: does not qualify  Vision Screening: Recommended annual ophthalmology exams for early detection of glaucoma and other disorders of the eye. Is the patient up to date with their annual eye exam?  Yes  Who is the provider or what is the name of the office in which the patient attends annual eye exams? Dr. Jeni Salles   Dental Screening: Recommended annual dental exams  for proper oral hygiene  Community Resource Referral / Chronic Care Management: CRR required this visit?  No   CCM required this visit?  No      Plan:     I have personally reviewed and noted the following in the patient's chart:   Medical and social history Use of alcohol, tobacco or illicit drugs  Current medications and supplements including opioid prescriptions.  Functional ability and status Nutritional status Physical activity Advanced directives List of other physicians Hospitalizations, surgeries, and ER visits in previous 12 months Vitals Screenings to include cognitive, depression, and falls Referrals and appointments  In addition, I have reviewed and discussed with patient certain preventive protocols, quality metrics, and best practice recommendations. A written personalized care plan for preventive services as well as general preventive health recommendations were provided to patient.   Due to this being a telephonic visit, the after visit summary with patients  personalized plan was offered to patient via mail or my-chart.  Patient would like to access on my-chart.  Loma Messing, LPN   [id number]/03/[id number]   Nurse Health Advisor  Nurse Notes: none

## 2021-08-23 ENCOUNTER — Telehealth: Payer: Self-pay | Admitting: Family Medicine

## 2021-08-23 NOTE — Telephone Encounter (Signed)
I refilled it  Please set up PE in late winter or spring when we are back

## 2021-08-23 NOTE — Telephone Encounter (Signed)
Pt has had multiple recent UTI appts but no recent or future CPE or f/u, and no future appts.  Last filled on 05/15/21 #90 caps with 0 refills

## 2021-08-24 ENCOUNTER — Ambulatory Visit (INDEPENDENT_AMBULATORY_CARE_PROVIDER_SITE_OTHER): Payer: Medicare Other

## 2021-08-24 VITALS — Ht 63.0 in | Wt 131.0 lb

## 2021-08-24 DIAGNOSIS — Z0000 Encounter for general adult medical examination without abnormal findings: Secondary | ICD-10-CM

## 2021-08-24 NOTE — Telephone Encounter (Signed)
Called patient and got her scheduled for 2/15 at 10

## 2021-08-24 NOTE — Patient Instructions (Addendum)
[name] , Thank you for taking time to complete your Medicare Wellness Visit. I appreciate your ongoing commitment to your health goals. Please review the following plan we discussed and let me know if I can assist you in the future.   Screening recommendations/referrals: Colonoscopy: no longer required Mammogram: due, last completed 12/09/17, Declined today, please call office to schedule if you change your mind Bone Density: due, last completed 02/05/17, order placed 05/05/21, schedule appointment when you are ready Recommended yearly ophthalmology/optometry visit for glaucoma screening and checkup Recommended yearly dental visit for hygiene and checkup  Vaccinations: Influenza vaccine: up to date Pneumococcal vaccine: up to date Tdap vaccine: up to date, completed 03/17/13, due 03/18/23 Shingles vaccine: Discuss with your local pharmacy Covid-19:newest booster available at your local pharmacy  Advanced directives: Please bring a copy of Living Will and/or [place] for your chart.   Conditions/risks identified: see problem list  Next appointment: Follow up in one year for your annual wellness visit    Preventive Care 65 Years and Older, Female Preventive care refers to lifestyle choices and visits with your health care provider that can promote health and wellness. What does preventive care include? A yearly physical exam. This is also called an annual well check. Dental exams once or twice a year. Routine eye exams. Ask your health care provider how often you should have your eyes checked. Personal lifestyle choices, including: Daily care of your teeth and gums. Regular physical activity. Eating a healthy diet. Avoiding tobacco and drug use. Limiting alcohol use. Practicing safe sex. Taking low-dose aspirin every day. Taking vitamin and mineral supplements as recommended by your health care provider. What happens during an annual well check? The services  and screenings done by your health care provider during your annual well check will depend on your age, overall health, lifestyle risk factors, and family history of disease. Counseling  Your health care provider may ask you questions about your: Alcohol use. Tobacco use. Drug use. Emotional well-being. Home and relationship well-being. Sexual activity. Eating habits. History of falls. Memory and ability to understand (cognition). Work and work Statistician. Reproductive health. Screening  You may have the following tests or measurements: Height, weight, and BMI. Blood pressure. Lipid and cholesterol levels. These may be checked every 5 years, or more frequently if you are over 14 years old. Skin check. Lung cancer screening. You may have this screening every year starting at age 33 if you have a 30-pack-year history of smoking and currently smoke or have quit within the past 15 years. Fecal occult blood test (FOBT) of the stool. You may have this test every year starting at age 44. Flexible sigmoidoscopy or colonoscopy. You may have a sigmoidoscopy every 5 years or a colonoscopy every 10 years starting at age 40. Hepatitis C blood test. Hepatitis B blood test. Sexually transmitted disease (STD) testing. Diabetes screening. This is done by checking your blood sugar (glucose) after you have not eaten for a while (fasting). You may have this done every 1-3 years. Bone density scan. This is done to screen for osteoporosis. You may have this done starting at age 97. Mammogram. This may be done every 1-2 years. Talk to your health care provider about how often you should have regular mammograms. Talk with your health care provider about your test results, treatment options, and if necessary, the need for more tests. Vaccines  Your health care provider may recommend certain vaccines, such as: Influenza vaccine. This is recommended  every year. Tetanus, diphtheria, and acellular pertussis  (Tdap, Td) vaccine. You may need a Td booster every 10 years. Zoster vaccine. You may need this after age 16. Pneumococcal 13-valent conjugate (PCV13) vaccine. One dose is recommended after age 17. Pneumococcal polysaccharide (PPSV23) vaccine. One dose is recommended after age 47. Talk to your health care provider about which screenings and vaccines you need and how often you need them. This information is not intended to replace advice given to you by your health care provider. Make sure you discuss any questions you have with your health care provider. Document Released: 09/30/2015 Document Revised: 05/23/2016 Document Reviewed: 07/05/2015 Elsevier Interactive Patient Education  2017 [place] Prevention in the Home Falls can cause injuries. They can happen to people of all ages. There are many things you can do to make your home safe and to help prevent falls. What can I do on the outside of my home? Regularly fix the edges of walkways and driveways and fix any cracks. Remove anything that might make you trip as you walk through a door, such as a raised step or threshold. Trim any bushes or trees on the path to your home. Use bright outdoor lighting. Clear any walking paths of anything that might make someone trip, such as rocks or tools. Regularly check to see if handrails are loose or broken. Make sure that both sides of any steps have handrails. Any raised decks and porches should have guardrails on the edges. Have any leaves, snow, or ice cleared regularly. Use sand or salt on walking paths during winter. Clean up any spills in your garage right away. This includes oil or grease spills. What can I do in the bathroom? Use night lights. Install grab bars by the toilet and in the tub and shower. Do not use towel bars as grab bars. Use non-skid mats or decals in the tub or shower. If you need to sit down in the shower, use a plastic, non-slip stool. Keep the floor dry. Clean  up any water that spills on the floor as soon as it happens. Remove soap buildup in the tub or shower regularly. Attach bath mats securely with double-sided non-slip rug tape. Do not have throw rugs and other things on the floor that can make you trip. What can I do in the bedroom? Use night lights. Make sure that you have a light by your bed that is easy to reach. Do not use any sheets or blankets that are too big for your bed. They should not hang down onto the floor. Have a firm chair that has side arms. You can use this for support while you get dressed. Do not have throw rugs and other things on the floor that can make you trip. What can I do in the kitchen? Clean up any spills right away. Avoid walking on wet floors. Keep items that you use a lot in easy-to-reach places. If you need to reach something above you, use a strong step stool that has a grab bar. Keep electrical cords out of the way. Do not use floor polish or wax that makes floors slippery. If you must use wax, use non-skid floor wax. Do not have throw rugs and other things on the floor that can make you trip. What can I do with my stairs? Do not leave any items on the stairs. Make sure that there are handrails on both sides of the stairs and use them. Fix handrails that are broken  or loose. Make sure that handrails are as long as the stairways. Check any carpeting to make sure that it is firmly attached to the stairs. Fix any carpet that is loose or worn. Avoid having throw rugs at the top or bottom of the stairs. If you do have throw rugs, attach them to the floor with carpet tape. Make sure that you have a light switch at the top of the stairs and the bottom of the stairs. If you do not have them, ask someone to add them for you. What else can I do to help prevent falls? Wear shoes that: Do not have high heels. Have rubber bottoms. Are comfortable and fit you well. Are closed at the toe. Do not wear sandals. If you  use a stepladder: Make sure that it is fully opened. Do not climb a closed stepladder. Make sure that both sides of the stepladder are locked into place. Ask someone to hold it for you, if possible. Clearly mark and make sure that you can see: Any grab bars or handrails. First and last steps. Where the edge of each step is. Use tools that help you move around (mobility aids) if they are needed. These include: Canes. Walkers. Scooters. Crutches. Turn on the lights when you go into a dark area. Replace any light bulbs as soon as they burn out. Set up your furniture so you have a clear path. Avoid moving your furniture around. If any of your floors are uneven, fix them. If there are any pets around you, be aware of where they are. Review your medicines with your doctor. Some medicines can make you feel dizzy. This can increase your chance of falling. Ask your doctor what other things that you can do to help prevent falls. This information is not intended to replace advice given to you by your health care provider. Make sure you discuss any questions you have with your health care provider. Document Released: 06/30/2009 Document Revised: 02/09/2016 Document Reviewed: 10/08/2014 Elsevier Interactive Patient Education  2017 [employer].

## 2021-09-07 ENCOUNTER — Other Ambulatory Visit: Payer: Self-pay | Admitting: Cardiovascular Disease

## 2021-09-25 ENCOUNTER — Other Ambulatory Visit (INDEPENDENT_AMBULATORY_CARE_PROVIDER_SITE_OTHER): Payer: Self-pay | Admitting: Nurse Practitioner

## 2021-09-25 ENCOUNTER — Telehealth (INDEPENDENT_AMBULATORY_CARE_PROVIDER_SITE_OTHER): Payer: Medicare HMO | Admitting: Family

## 2021-09-25 ENCOUNTER — Encounter: Payer: Self-pay | Admitting: Family

## 2021-09-25 VITALS — Temp 97.7°F | Ht 63.0 in | Wt 130.0 lb

## 2021-09-25 DIAGNOSIS — J0100 Acute maxillary sinusitis, unspecified: Secondary | ICD-10-CM | POA: Diagnosis not present

## 2021-09-25 DIAGNOSIS — I739 Peripheral vascular disease, unspecified: Secondary | ICD-10-CM

## 2021-09-25 DIAGNOSIS — I701 Atherosclerosis of renal artery: Secondary | ICD-10-CM

## 2021-09-25 MED ORDER — AMOXICILLIN-POT CLAVULANATE 875-125 MG PO TABS: 1.0000 | ORAL_TABLET | Freq: Two times a day (BID) | ORAL | 0 refills | Status: AC

## 2021-09-25 NOTE — Progress Notes (Signed)
MyChart Video Visit    Virtual Visit via Video Note   This visit type was conducted due to national recommendations for restrictions regarding the COVID-19 Pandemic (e.g. social distancing) in an effort to limit this patient's exposure and mitigate transmission in our community. This patient is at least at moderate risk for complications without adequate follow up. This format is felt to be most appropriate for this patient at this time. Physical exam was limited by quality of the video and audio technology used for the visit. CMA was able to get the patient set up on a video visit.  Patient location: Home. Patient and provider in visit Provider location: Office  I discussed the limitations of evaluation and management by telemedicine and the availability of in person appointments. The patient expressed understanding and agreed to proceed.  Visit Date: 09/25/2021  Today's healthcare provider: Eugenia Pancoast, FNP     Subjective:    Patient ID: Tracey [name], female    DOB: 01/21/1936, 86 y.o.   MRN: [id number]  Chief Complaint  Patient presents with   Cough   Nasal Congestion   Generalized Body Aches   Chills    Cough Pertinent negatives include no chest pain, chills, ear pain, fever or sore throat. Shortness of breath: clear.  86 y/o female here with concerns.  One week h/o feeling terrible with nasal congestion, body aches, chills and dry non prodcutive cough. Nose is with rhinorrhea/ clear discharge. Is taking mucinex DM  but has not noticed any relief. Does take tessalon perrles which are helpful for the cough. Throat is scratchy but gargling with salt water with slight improvement. No ear pain. Does note increased fatigue requiring naps throughout the day which is not normal for her.  Did try to covid test but test was expired.  No shortness of breath except with bad coughing spell. Denies chest congestion.   Past Medical History:  Diagnosis Date   Anxiety     Arthritis    Atrial fibrillation ([place])    history   Chronic kidney disease    ckd stage II   Coronary artery disease    Diabetes mellitus    non insulin dependent   Dysrhythmia    atrial fibrillation, history of   GERD (gastroesophageal reflux disease)    Heart murmur    History of kidney stones    Hyperlipidemia    Hypertension    Peripheral vascular disease ([place])    Rectocele    Syncope 09/2019   UTI (urinary tract infection)     Past Surgical History:  Procedure Laterality Date   ABDOMINAL AORTA STENT     APPENDECTOMY     BREAST BIOPSY Left    20 years ago   BREAST BIOPSY Right 2008   BREAST EXCISIONAL BIOPSY     rt 2008 lt "20 years ago"   CARDIAC CATHETERIZATION     CATARACT EXTRACTION W/PHACO Left 08/15/2015   Procedure: CATARACT EXTRACTION PHACO AND INTRAOCULAR LENS PLACEMENT ([place]);  Surgeon: Estill Cotta, MD;  Location: [facility] ORS;  Service: Ophthalmology;  Laterality: Left;  [place] 01:28 AP% 26.4 CDE 35.73 fluid pack # [id number]   COLONOSCOPY WITH PROPOFOL N/A 01/16/2016   Procedure: COLONOSCOPY WITH PROPOFOL;  Surgeon: Manya Silvas, MD;  Location: [facility] ENDOSCOPY;  Service: Endoscopy;  Laterality: N/A;   CORONARY ANGIOPLASTY WITH STENT PLACEMENT     CORONARY ARTERY BYPASS GRAFT  1995   with LIMA  to the LAD, SVG to OM1, SVG to PDA  ESOPHAGOGASTRODUODENOSCOPY (EGD) WITH PROPOFOL N/A 01/16/2016   Procedure: ESOPHAGOGASTRODUODENOSCOPY (EGD) WITH PROPOFOL;  Surgeon: Manya Silvas, MD;  Location: [facility] ENDOSCOPY;  Service: Endoscopy;  Laterality: N/A;   EYE SURGERY     JOINT REPLACEMENT Right    KNEE ARTHROPLASTY Left 01/06/2020   Procedure: COMPUTER ASSISTED TOTAL KNEE ARTHROPLASTY;  Surgeon: Dereck Leep, MD;  Location: [facility] ORS;  Service: Orthopedics;  Laterality: Left;   LOWER EXTREMITY ANGIOGRAPHY N/A 07/08/2019   Procedure: Abdominal Aortagraphy;  Surgeon: Algernon Huxley, MD;  Location: [facility] LAB;  Service: Cardiovascular;  Laterality: N/A;    RENAL ANGIOGRAPHY Left 08/26/2019   Procedure: RENAL ANGIOGRAPHY;  Surgeon: Algernon Huxley, MD;  Location: [place] [facility];  Service: Cardiovascular;  Laterality: Left;   RENAL ARTERY STENT     left, By Dr Hulda Humphrey   REPLACEMENT TOTAL KNEE  03/25/2013   right   TONSILLECTOMY      Family History  Problem Relation Age of Onset   Heart disease Father    Other Brother        open heart surgery   Cancer Brother    Other Brother        open heart surgery   Other Brother        open heart surgery   Hyperlipidemia Daughter    Breast cancer Neg Hx    Kidney cancer Neg Hx    Bladder Cancer Neg Hx     Social History   Socioeconomic History   Marital status: Married    Spouse name: Not on file   Number of children: Not on file   Years of education: Not on file   Highest education level: Not on file  Occupational History   Occupation: school cafeteria    Comment: retired  Tobacco Use   Smoking status: Never   Smokeless tobacco: Never  Vaping Use   Vaping Use: Never used  Substance and Sexual Activity   Alcohol use: No   Drug use: No   Sexual activity: Never  Other Topics Concern   Not on file  Social History Narrative   Patient has 3 daughters. Remarried June 2021.   Social Determinants of Health   Financial Resource Strain: Low Risk    Difficulty of Paying Living Expenses: Not hard at all  Food Insecurity: No Food Insecurity   Worried About Charity fundraiser in the Last Year: Never true   [place] in the Last Year: Never true  Transportation Needs: No Transportation Needs   Lack of Transportation (Medical): No   Lack of Transportation (Non-Medical): No  Physical Activity: Insufficiently Active   Days of Exercise per Week: 7 days   Minutes of Exercise per Session: 10 min  Stress: No Stress Concern Present   Feeling of Stress : Not at all  Social Connections: Socially Integrated   Frequency of Communication with Friends and Family: More than three times a  week   Frequency of Social Gatherings with Friends and Family: More than three times a week   Attends Religious Services: More than 4 times per year   Active Member of [employer] or Organizations: Yes   Attends Music therapist: More than 4 times per year   Marital Status: Married  Human resources officer Violence: Not At Risk   Fear of Current or Ex-Partner: No   Emotionally Abused: No   Physically Abused: No   Sexually Abused: No    Outpatient Medications Prior to Visit  Medication Sig Dispense Refill   ACCU-CHEK GUIDE test strip USE AS INSTRUCTED TO CHECK BLOOD SUGAR ONE TO TWO TIMES A DAY. 100 strip 2   acetaminophen (TYLENOL) 500 MG tablet Take 500 mg by mouth in the morning and at bedtime.     carvedilol (COREG) 3.125 MG tablet TAKE 1 TABLET BY MOUTH TWICE DAILY 180 tablet 0   clopidogrel (PLAVIX) 75 MG tablet TAKE 1 TABLET BY MOUTH ONCE DAILY. 90 tablet 0   CRANBERRY PO Take 1 tablet by mouth daily.     Cyanocobalamin (VITAMIN B-12 IJ) Inject as directed See admin instructions. Every two months     glipiZIDE (GLUCOTROL XL) 2.5 MG 24 hr tablet TAKE 1 TABLET BY MOUTH ONCE DAILY. 90 tablet 0   Lancets MISC 1 each by Does not apply route 2 (two) times daily. 100 each 12   lisinopril (ZESTRIL) 10 MG tablet TAKE 1 TABLET BY MOUTH ONCE DAILY. 90 tablet 0   metFORMIN (GLUCOPHAGE-XR) 500 MG 24 hr tablet TAKE 1 TABLET BY MOUTH ONCE DAILY. 90 tablet 0   Multiple Vitamins-Minerals (MULTIVITAMIN WITH MINERALS) tablet Take 1 tablet by mouth daily.     nitroGLYCERIN (NITROSTAT) 0.4 MG SL tablet PLACE 1 TABLET (0.4 MG TOTAL) UNDER THE TONGUE EVERY 5 (FIVE) MINUTES AS NEEDED FOR CHEST PAIN. 25 tablet 0   OneTouch [employer]ica Lancets 52D MISC USE TO TEST TWICE A DAY 100 each 1   pantoprazole (PROTONIX) 40 MG tablet TAKE 1 TABLET BY MOUTH ONCE DAILY. 90 tablet 1   Probiotic Product (ALIGN) 4 MG CAPS Take 1 capsule by mouth daily.     rosuvastatin (CRESTOR) 40 MG tablet TAKE 1 TABLET BY MOUTH AT BEDTIME  FOR CHOLESTEROL 90 tablet 0   ezetimibe (ZETIA) 10 MG tablet Take 1 tablet (10 mg total) by mouth daily. 90 tablet 3   gabapentin (NEURONTIN) 100 MG capsule TAKE (1) CAPSULE DAILY AT BEDTIME AS NEEDED FOR CRAMPS. (Patient not taking: Reported on 09/25/2021) 90 capsule 1   No facility-administered medications prior to visit.    Allergies  Allergen Reactions   Codeine Nausea Only    Sick to her stomach   Erythromycin Nausea And Vomiting and Other (See Comments)   Tramadol Nausea Only and Other (See Comments)    Loss of appetite and did not eat   Ciprofloxacin Diarrhea    Upsets stomach    Review of Systems  Constitutional:  Negative for chills and fever.  HENT:  Positive for congestion. Negative for ear pain, sinus pain and sore throat.        Rhinorrhea   Respiratory:  Positive for cough and sputum production. Shortness of breath: clear.  Cardiovascular:  Negative for chest pain.  All other systems reviewed and are negative.     Objective:    Physical Exam Constitutional:      General: She is not in acute distress.    Appearance: Normal appearance. She is normal weight. She is ill-appearing. She is not toxic-appearing or diaphoretic.  Neurological:     Mental Status: She is alert.    Temp 97.7 F (36.5 C) (Oral)    Ht 5\' 3"  (1.6 m)    Wt 130 lb (59 kg)    BMI 23.03 kg/m  Wt Readings from Last 3 Encounters:  09/25/21 130 lb (59 kg)  08/24/21 131 lb (59.4 kg)  08/16/21 131 lb 9.6 oz (59.7 kg)       Assessment & Plan:   Problem List Items Addressed This  Visit       Respiratory   Acute maxillary sinusitis - Primary    Take antibiotic as prescribed. Increase oral fluids. I am concerned with increased fatigue, and want to ensure that there is any progression/worsening of symptoms to let me know immediately as pt very fatigued.       Relevant Medications   amoxicillin-clavulanate (AUGMENTIN) 875-125 MG tablet    I have discontinued Francene Boyers. Velie's gabapentin. I  am also having her start on amoxicillin-clavulanate. Additionally, I am having her maintain her acetaminophen, Align, multivitamin with minerals, CRANBERRY PO, Cyanocobalamin (VITAMIN B-12 IJ), OneTouch Delica Lancets 78M, nitroGLYCERIN, Lancets, ezetimibe, lisinopril, carvedilol, Accu-Chek Guide, pantoprazole, metFORMIN, glipiZIDE, rosuvastatin, and clopidogrel.  Meds ordered this encounter  Medications   amoxicillin-clavulanate (AUGMENTIN) 875-125 MG tablet    Sig: Take 1 tablet by mouth 2 (two) times daily for 7 days.    Dispense:  14 tablet    Refill:  0    Order Specific Question:   Supervising Provider    Answer:   BEDSOLE, AMY E [[id number]]    I discussed the assessment and treatment plan with the patient. The patient was provided an opportunity to ask questions and all were answered. The patient agreed with the plan and demonstrated an understanding of the instructions.   The patient was advised to call back or seek an in-person evaluation if the symptoms worsen or if the condition fails to improve as anticipated.  I provided 16 minutes of face-to-face time during this encounter.   Eugenia Pancoast, [place] [facility] [place] [phone] (phone) [phone] (fax)  [place]

## 2021-09-25 NOTE — Assessment & Plan Note (Addendum)
Take antibiotic as prescribed. Increase oral fluids. I am concerned with increased fatigue, and want to ensure that there is any progression/worsening of symptoms to let me know immediately as pt very fatigued.

## 2021-09-25 NOTE — Progress Notes (Deleted)
Established Patient Office Visit  Subjective:  Patient ID: Tracey [name], female    DOB: Nov 15, 1935  Age: 86 y.o. MRN: [id number]  CC:  Chief Complaint  Patient presents with   Cough   Nasal Congestion   Generalized Body Aches   Chills    HPI Tracey [name] is here today with concerns.   Past Medical History:  Diagnosis Date   Anxiety    Arthritis    Atrial fibrillation ([place])    history   Chronic kidney disease    ckd stage II   Coronary artery disease    Diabetes mellitus    non insulin dependent   Dysrhythmia    atrial fibrillation, history of   GERD (gastroesophageal reflux disease)    Heart murmur    History of kidney stones    Hyperlipidemia    Hypertension    Peripheral vascular disease ([place])    Rectocele    Syncope 09/2019   UTI (urinary tract infection)     Past Surgical History:  Procedure Laterality Date   ABDOMINAL AORTA STENT     APPENDECTOMY     BREAST BIOPSY Left    20 years ago   BREAST BIOPSY Right 2008   BREAST EXCISIONAL BIOPSY     rt 2008 lt "20 years ago"   CARDIAC CATHETERIZATION     CATARACT EXTRACTION W/PHACO Left 08/15/2015   Procedure: CATARACT EXTRACTION PHACO AND INTRAOCULAR LENS PLACEMENT ([place]);  Surgeon: Estill Cotta, MD;  Location: [facility] ORS;  Service: Ophthalmology;  Laterality: Left;  [place] 01:28 AP% 26.4 CDE 35.73 fluid pack # [id number]   COLONOSCOPY WITH PROPOFOL N/A 01/16/2016   Procedure: COLONOSCOPY WITH PROPOFOL;  Surgeon: Manya Silvas, MD;  Location: [facility] ENDOSCOPY;  Service: Endoscopy;  Laterality: N/A;   CORONARY ANGIOPLASTY WITH STENT PLACEMENT     CORONARY ARTERY BYPASS GRAFT  1995   with LIMA  to the LAD, SVG to OM1, SVG to PDA   ESOPHAGOGASTRODUODENOSCOPY (EGD) WITH PROPOFOL N/A 01/16/2016   Procedure: ESOPHAGOGASTRODUODENOSCOPY (EGD) WITH PROPOFOL;  Surgeon: Manya Silvas, MD;  Location[facility] ENDOSCOPY;  Service: Endoscopy;  Laterality: N/A;   EYE SURGERY     JOINT REPLACEMENT Right    KNEE  ARTHROPLASTY Left 01/06/2020   Procedure: COMPUTER ASSISTED TOTAL KNEE ARTHROPLASTY;  Surgeon: Dereck Leep, MD;  Location: [facility] ORS;  Service: Orthopedics;  Laterality: Left;   LOWER EXTREMITY ANGIOGRAPHY N/A 07/08/2019   Procedure: Abdominal Aortagraphy;  Surgeon: Algernon Huxley, MD;  Location: [place] [facility];  Service: Cardiovascular;  Laterality: N/A;   RENAL ANGIOGRAPHY Left 08/26/2019   Procedure: RENAL ANGIOGRAPHY;  Surgeon: Algernon Huxley, MD;  Location: [place] [facility];  Service: Cardiovascular;  Laterality: Left;   RENAL ARTERY STENT     left, By Dr Hulda Humphrey   REPLACEMENT TOTAL KNEE  03/25/2013   right   TONSILLECTOMY      Family History  Problem Relation Age of Onset   Heart disease Father    Other Brother        open heart surgery   Cancer Brother    Other Brother        open heart surgery   Other Brother        open heart surgery   Hyperlipidemia Daughter    Breast cancer Neg Hx    Kidney cancer Neg Hx    Bladder Cancer Neg Hx     Social History   Socioeconomic History   Marital status:  Married    Spouse name: Not on file   Number of children: Not on file   Years of education: Not on file   Highest education level: Not on file  Occupational History   Occupation: school cafeteria    Comment: retired  Tobacco Use   Smoking status: Never   Smokeless tobacco: Never  Vaping Use   Vaping Use: Never used  Substance and Sexual Activity   Alcohol use: No   Drug use: No   Sexual activity: Never  Other Topics Concern   Not on file  Social History Narrative   Patient has 3 daughters. Remarried June 2021.   Social Determinants of Health   Financial Resource Strain: Low Risk    Difficulty of Paying Living Expenses: Not hard at all  Food Insecurity: No Food Insecurity   Worried About Charity fundraiser in the Last Year: Never true   [place] in the Last Year: Never true  Transportation Needs: No Transportation Needs   Lack of Transportation  (Medical): No   Lack of Transportation (Non-Medical): No  Physical Activity: Insufficiently Active   Days of Exercise per Week: 7 days   Minutes of Exercise per Session: 10 min  Stress: No Stress Concern Present   Feeling of Stress : Not at all  Social Connections: Socially Integrated   Frequency of Communication with Friends and Family: More than three times a week   Frequency of Social Gatherings with Friends and Family: More than three times a week   Attends Religious Services: More than 4 times per year   Active Member of [employer] or Organizations: Yes   Attends Music therapist: More than 4 times per year   Marital Status: Married  Human resources officer Violence: Not At Risk   Fear of Current or Ex-Partner: No   Emotionally Abused: No   Physically Abused: No   Sexually Abused: No    Outpatient Medications Prior to Visit  Medication Sig Dispense Refill   ACCU-CHEK GUIDE test strip USE AS INSTRUCTED TO CHECK BLOOD SUGAR ONE TO TWO TIMES A DAY. 100 strip 2   acetaminophen (TYLENOL) 500 MG tablet Take 500 mg by mouth in the morning and at bedtime.     carvedilol (COREG) 3.125 MG tablet TAKE 1 TABLET BY MOUTH TWICE DAILY 180 tablet 0   clopidogrel (PLAVIX) 75 MG tablet TAKE 1 TABLET BY MOUTH ONCE DAILY. 90 tablet 0   CRANBERRY PO Take 1 tablet by mouth daily.     Cyanocobalamin (VITAMIN B-12 IJ) Inject as directed See admin instructions. Every two months     glipiZIDE (GLUCOTROL XL) 2.5 MG 24 hr tablet TAKE 1 TABLET BY MOUTH ONCE DAILY. 90 tablet 0   Lancets MISC 1 each by Does not apply route 2 (two) times daily. 100 each 12   lisinopril (ZESTRIL) 10 MG tablet TAKE 1 TABLET BY MOUTH ONCE DAILY. 90 tablet 0   metFORMIN (GLUCOPHAGE-XR) 500 MG 24 hr tablet TAKE 1 TABLET BY MOUTH ONCE DAILY. 90 tablet 0   Multiple Vitamins-Minerals (MULTIVITAMIN WITH MINERALS) tablet Take 1 tablet by mouth daily.     nitroGLYCERIN (NITROSTAT) 0.4 MG SL tablet PLACE 1 TABLET (0.4 MG TOTAL) UNDER  THE TONGUE EVERY 5 (FIVE) MINUTES AS NEEDED FOR CHEST PAIN. 25 tablet 0   OneTouch Delica Lancets 26R MISC USE TO TEST TWICE A DAY 100 each 1   pantoprazole (PROTONIX) 40 MG tablet TAKE 1 TABLET BY MOUTH ONCE DAILY. 90 tablet  1   Probiotic Product (ALIGN) 4 MG CAPS Take 1 capsule by mouth daily.     rosuvastatin (CRESTOR) 40 MG tablet TAKE 1 TABLET BY MOUTH AT BEDTIME FOR CHOLESTEROL 90 tablet 0   ezetimibe (ZETIA) 10 MG tablet Take 1 tablet (10 mg total) by mouth daily. 90 tablet 3   gabapentin (NEURONTIN) 100 MG capsule TAKE (1) CAPSULE DAILY AT BEDTIME AS NEEDED FOR CRAMPS. (Patient not taking: Reported on 09/25/2021) 90 capsule 1   No facility-administered medications prior to visit.    Allergies  Allergen Reactions   Codeine Nausea Only    Sick to her stomach   Erythromycin Nausea And Vomiting and Other (See Comments)   Tramadol Nausea Only and Other (See Comments)    Loss of appetite and did not eat   Ciprofloxacin Diarrhea    Upsets stomach    ROS Review of Systems    Objective:    Physical Exam  Temp 97.7 F (36.5 C) (Oral)    Ht 5\' 3"  (1.6 m)    Wt 130 lb (59 kg)    BMI 23.03 kg/m  Wt Readings from Last 3 Encounters:  09/25/21 130 lb (59 kg)  08/24/21 131 lb (59.4 kg)  08/16/21 131 lb 9.6 oz (59.7 kg)     Health Maintenance Due  Topic Date Due   Zoster Vaccines- Shingrix (1 of 2) Never done   FOOT EXAM  03/31/2020   COVID-19 Vaccine (4 - Booster for Moderna series) 09/26/2020    There are no preventive care reminders to display for this patient.  Lab Results  Component Value Date   TSH 1.94 06/07/2021   Lab Results  Component Value Date   WBC 4.1 06/07/2021   HGB 11.4 (L) 06/07/2021   HCT 34.1 (L) 06/07/2021   MCV 89.4 06/07/2021   PLT 131.0 (L) 06/07/2021   Lab Results  Component Value Date   NA 140 06/07/2021   K 4.4 06/07/2021   CO2 32 06/07/2021   GLUCOSE 172 (H) 06/07/2021   BUN 21 06/07/2021   CREATININE 0.93 06/07/2021   BILITOT 0.6  04/24/2021   ALKPHOS 51 10/21/2020   AST 23 04/24/2021   ALT 14 04/24/2021   PROT 6.7 04/24/2021   ALBUMIN 4.5 10/21/2020   CALCIUM 10.5 06/07/2021   ANIONGAP 7 12/29/2019   GFR 57.12 (L) 06/07/2021   Lab Results  Component Value Date   HGBA1C 7.0 (A) 04/26/2021      Assessment & Plan:   Problem List Items Addressed This Visit   None   No orders of the defined types were placed in this encounter.   Follow-up: No follow-ups on file.    Eugenia Pancoast, FNP

## 2021-09-26 ENCOUNTER — Encounter (INDEPENDENT_AMBULATORY_CARE_PROVIDER_SITE_OTHER): Payer: Medicare HMO

## 2021-09-26 ENCOUNTER — Ambulatory Visit (INDEPENDENT_AMBULATORY_CARE_PROVIDER_SITE_OTHER): Payer: Medicare HMO | Admitting: Vascular Surgery

## 2021-10-13 ENCOUNTER — Other Ambulatory Visit: Payer: Self-pay | Admitting: Cardiovascular Disease

## 2021-10-30 ENCOUNTER — Encounter: Payer: Self-pay | Admitting: Nurse Practitioner

## 2021-10-30 ENCOUNTER — Other Ambulatory Visit: Payer: Self-pay

## 2021-10-30 ENCOUNTER — Ambulatory Visit (INDEPENDENT_AMBULATORY_CARE_PROVIDER_SITE_OTHER): Payer: Medicare HMO | Admitting: Nurse Practitioner

## 2021-10-30 VITALS — BP 130/64 | HR 56 | Ht 63.0 in | Wt 131.4 lb

## 2021-10-30 DIAGNOSIS — E559 Vitamin D deficiency, unspecified: Secondary | ICD-10-CM | POA: Diagnosis not present

## 2021-10-30 DIAGNOSIS — I10 Essential (primary) hypertension: Secondary | ICD-10-CM

## 2021-10-30 DIAGNOSIS — E782 Mixed hyperlipidemia: Secondary | ICD-10-CM | POA: Diagnosis not present

## 2021-10-30 DIAGNOSIS — N182 Chronic kidney disease, stage 2 (mild): Secondary | ICD-10-CM

## 2021-10-30 DIAGNOSIS — E1122 Type 2 diabetes mellitus with diabetic chronic kidney disease: Secondary | ICD-10-CM

## 2021-10-30 LAB — POCT GLYCOSYLATED HEMOGLOBIN (HGB A1C): HbA1c, POC (controlled diabetic range): 7.7 % — AB (ref 0.0–7.0)

## 2021-10-30 NOTE — Progress Notes (Signed)
10/30/2021, 4:42 PM  Endocrinology follow-up note   Subjective:    Patient ID: Tracey [name], female    DOB: 1936-07-24.  Tracey [name] is being seen  in follow-up after she was seen in consultation for management of currently uncontrolled symptomatic diabetes requested by  Abner Greenspan, MD.   Past Medical History:  Diagnosis Date   Anxiety    Arthritis    Atrial fibrillation ([place])    history   Chronic kidney disease    ckd stage II   Coronary artery disease    Diabetes mellitus    non insulin dependent   Dysrhythmia    atrial fibrillation, history of   GERD (gastroesophageal reflux disease)    Heart murmur    History of kidney stones    Hyperlipidemia    Hypertension    Peripheral vascular disease ([place])    Rectocele    Syncope 09/2019   UTI (urinary tract infection)     Past Surgical History:  Procedure Laterality Date   ABDOMINAL AORTA STENT     APPENDECTOMY     BREAST BIOPSY Left    20 years ago   BREAST BIOPSY Right 2008   BREAST EXCISIONAL BIOPSY     rt 2008 lt "20 years ago"   CARDIAC CATHETERIZATION     CATARACT EXTRACTION W/PHACO Left 08/15/2015   Procedure: CATARACT EXTRACTION PHACO AND INTRAOCULAR LENS PLACEMENT ([place]);  Surgeon: Estill Cotta, MD;  Location: [facility] ORS;  Service: Ophthalmology;  Laterality: Left;  [place] 01:28 AP% 26.4 CDE 35.73 fluid pack # [id number]   COLONOSCOPY WITH PROPOFOL N/A 01/16/2016   Procedure: COLONOSCOPY WITH PROPOFOL;  Surgeon: Manya Silvas, MD;  Location: [facility] ENDOSCOPY;  Service: Endoscopy;  Laterality: N/A;   CORONARY ANGIOPLASTY WITH STENT PLACEMENT     CORONARY ARTERY BYPASS GRAFT  1995   with LIMA  to the LAD, SVG to OM1, SVG to PDA   ESOPHAGOGASTRODUODENOSCOPY (EGD) WITH PROPOFOL N/A 01/16/2016   Procedure: ESOPHAGOGASTRODUODENOSCOPY (EGD) WITH PROPOFOL;  Surgeon: Manya Silvas, MD;  Location: [facility] ENDOSCOPY;  Service:  Endoscopy;  Laterality: N/A;   EYE SURGERY     JOINT REPLACEMENT Right    KNEE ARTHROPLASTY Left 01/06/2020   Procedure: COMPUTER ASSISTED TOTAL KNEE ARTHROPLASTY;  Surgeon: Dereck Leep, MD;  Location: [facility] ORS;  Service: Orthopedics;  Laterality: Left;   LOWER EXTREMITY ANGIOGRAPHY N/A 07/08/2019   Procedure: Abdominal Aortagraphy;  Surgeon: Algernon Huxley, MD;  Location: [place] CV LAB;  Service: Cardiovascular;  Laterality: N/A;   RENAL ANGIOGRAPHY Left 08/26/2019   Procedure: RENAL ANGIOGRAPHY;  Surgeon: Algernon Huxley, MD;  Location: [place] [facility];  Service: Cardiovascular;  Laterality: Left;   RENAL ARTERY STENT     left, By Dr Hulda Humphrey   REPLACEMENT TOTAL KNEE  03/25/2013   right   TONSILLECTOMY      Social History   Socioeconomic History   Marital status: Married    Spouse name: Not on file   Number of children: Not on file   Years of education: Not on file   Highest education level: Not  on file  Occupational History   Occupation: school cafeteria    Comment: retired  Tobacco Use   Smoking status: Never   Smokeless tobacco: Never  Vaping Use   Vaping Use: Never used  Substance and Sexual Activity   Alcohol use: No   Drug use: No   Sexual activity: Never  Other Topics Concern   Not on file  Social History Narrative   Patient has 3 daughters. Remarried June 2021.   Social Determinants of Health   Financial Resource Strain: Low Risk    Difficulty of Paying Living Expenses: Not hard at all  Food Insecurity: No Food Insecurity   Worried About Charity fundraiser in the Last Year: Never true   [place] in the Last Year: Never true  Transportation Needs: No Transportation Needs   Lack of Transportation (Medical): No   Lack of Transportation (Non-Medical): No  Physical Activity: Insufficiently Active   Days of Exercise per Week: 7 days   Minutes of Exercise per Session: 10 min  Stress: No Stress Concern Present   Feeling of Stress : Not at all   Social Connections: Socially Integrated   Frequency of Communication with Friends and Family: More than three times a week   Frequency of Social Gatherings with Friends and Family: More than three times a week   Attends Religious Services: More than 4 times per year   Active Member of [employer] or Organizations: Yes   Attends Music therapist: More than 4 times per year   Marital Status: Married    Family History  Problem Relation Age of Onset   Heart disease Father    Other Brother        open heart surgery   Cancer Brother    Other Brother        open heart surgery   Other Brother        open heart surgery   Hyperlipidemia Daughter    Breast cancer Neg Hx    Kidney cancer Neg Hx    Bladder Cancer Neg Hx     Outpatient Encounter Medications as of 10/30/2021  Medication Sig   ACCU-CHEK GUIDE test strip USE AS INSTRUCTED TO CHECK BLOOD SUGAR ONE TO TWO TIMES A DAY.   acetaminophen (TYLENOL) 500 MG tablet Take 500 mg by mouth in the morning and at bedtime.   carvedilol (COREG) 3.125 MG tablet TAKE 1 TABLET BY MOUTH TWICE DAILY   clopidogrel (PLAVIX) 75 MG tablet TAKE 1 TABLET BY MOUTH ONCE DAILY.   CRANBERRY PO Take 1 tablet by mouth daily.   Cyanocobalamin (VITAMIN B-12 IJ) Inject as directed See admin instructions. Every two months   ezetimibe (ZETIA) 10 MG tablet TAKE 1 TABLET BY MOUTH ONCE DAILY.   glipiZIDE (GLUCOTROL XL) 2.5 MG 24 hr tablet TAKE 1 TABLET BY MOUTH ONCE DAILY.   Lancets MISC 1 each by Does not apply route 2 (two) times daily.   lisinopril (ZESTRIL) 10 MG tablet TAKE 1 TABLET BY MOUTH ONCE DAILY.   metFORMIN (GLUCOPHAGE-XR) 500 MG 24 hr tablet TAKE 1 TABLET BY MOUTH ONCE DAILY.   Multiple Vitamins-Minerals (MULTIVITAMIN WITH MINERALS) tablet Take 1 tablet by mouth daily.   nitroGLYCERIN (NITROSTAT) 0.4 MG SL tablet PLACE 1 TABLET (0.4 MG TOTAL) UNDER THE TONGUE EVERY 5 (FIVE) MINUTES AS NEEDED FOR CHEST PAIN.   OneTouch Delica Lancets 83T MISC USE  TO TEST TWICE A DAY   pantoprazole (PROTONIX) 40 MG tablet TAKE 1  TABLET BY MOUTH ONCE DAILY.   Probiotic Product (ALIGN) 4 MG CAPS Take 1 capsule by mouth daily.   rosuvastatin (CRESTOR) 40 MG tablet TAKE 1 TABLET BY MOUTH AT BEDTIME FOR CHOLESTEROL   No facility-administered encounter medications on file as of 10/30/2021.    ALLERGIES: Allergies  Allergen Reactions   Codeine Nausea Only    Sick to her stomach   Erythromycin Nausea And Vomiting and Other (See Comments)   Tramadol Nausea Only and Other (See Comments)    Loss of appetite and did not eat   Ciprofloxacin Diarrhea    Upsets stomach    VACCINATION STATUS: Immunization History  Administered Date(s) Administered   Fluad Quad(high Dose 65+) 06/15/2019, 09/26/2020   Influenza Split 08/10/2015   Influenza,inj,Quad PF,6+ Mos 05/24/2016, 07/26/2017, 06/03/2018, 07/11/2021   Influenza-Unspecified 08/10/2015, 05/24/2016   Moderna Sars-Covid-2 Vaccination 10/02/2019, 10/30/2019, 08/01/2020   Pneumococcal Conjugate-13 11/05/2016   Pneumococcal Polysaccharide-23 12/11/2017   Td 03/17/2013    Diabetes She presents for her follow-up diabetic visit. She has type 2 diabetes mellitus. Onset time: She was diagnosed at approximate age of 51 years. Her disease course has been worsening. There are no hypoglycemic associated symptoms. Pertinent negatives for hypoglycemia include no confusion, headaches, pallor or seizures. Associated symptoms include blurred vision (especially in the evenings) and fatigue. Pertinent negatives for diabetes include no chest pain, no polydipsia, no polyphagia and no polyuria. There are no hypoglycemic complications. Symptoms are stable. Diabetic complications include heart disease, nephropathy and PVD. Risk factors for coronary artery disease include dyslipidemia, diabetes mellitus, hypertension, sedentary lifestyle and post-menopausal. Current diabetic treatment includes oral agent (dual therapy). She is  compliant with treatment all of the time. Her weight is fluctuating minimally. She is following a generally healthy diet. Meal planning includes avoidance of concentrated sweets. She rarely participates in exercise. Her home blood glucose trend is fluctuating minimally. Her breakfast blood glucose range is generally 140-180 mg/dl. Her overall blood glucose range is 140-180 mg/dl. (She presents today with her meter and logs showing slightly above target fasting glycemic profile.  Her POCT A1c today is 7.7%, increasing from her last visit of 7%.  She reports she has not been exercising optimally and has cut carbs out of her diet almost completely.  She does report symptoms of hypoglycemia intermittently.  She is very strict with her diet.) An ACE inhibitor/angiotensin II receptor blocker is being taken. She does not see a podiatrist.Eye exam is current.  Hyperlipidemia This is a chronic problem. The current episode started more than 1 year ago. The problem is controlled. Recent lipid tests were reviewed and are normal. Exacerbating diseases include chronic renal disease and diabetes. Factors aggravating her hyperlipidemia include beta blockers. Pertinent negatives include no chest pain, myalgias or shortness of breath. Current antihyperlipidemic treatment includes statins. The current treatment provides moderate improvement of lipids. There are no compliance problems.  Risk factors for coronary artery disease include dyslipidemia, diabetes mellitus, hypertension, post-menopausal and a sedentary lifestyle.  Hypertension This is a chronic problem. The current episode started more than 1 year ago. The problem has been gradually improving since onset. The problem is controlled. Associated symptoms include blurred vision (especially in the evenings). Pertinent negatives include no chest pain, headaches, palpitations or shortness of breath. There are no associated agents to hypertension. Risk factors for coronary artery  disease include diabetes mellitus, dyslipidemia, sedentary lifestyle and post-menopausal state. Past treatments include beta blockers and ACE inhibitors. The current treatment provides moderate improvement. There are no compliance  problems.  Hypertensive end-organ damage includes kidney disease, CAD/MI and PVD. Identifiable causes of hypertension include chronic renal disease.    Review of systems  Constitutional: + Minimally fluctuating body weight,  current Body mass index is 23.28 kg/m. , + fatigue, no subjective hyperthermia, no subjective hypothermia Eyes: no blurry vision, no xerophthalmia ENT: no sore throat, no nodules palpated in throat, no dysphagia/odynophagia, no hoarseness Cardiovascular: no chest pain, no shortness of breath, no palpitations, no leg swelling Respiratory: no cough, no shortness of breath Gastrointestinal: no nausea/vomiting/diarrhea Musculoskeletal: no muscle/joint aches Skin: no rashes, no hyperemia Neurological: no tremors, no numbness, no tingling, no dizziness Psychiatric: no depression, no anxiety   Objective:    BP 130/64    Pulse (!) 56    Ht 5\' 3"  (1.6 m)    Wt 131 lb 6.4 oz (59.6 kg)    SpO2 98%    BMI 23.28 kg/m   Wt Readings from Last 3 Encounters:  10/30/21 131 lb 6.4 oz (59.6 kg)  09/25/21 130 lb (59 kg)  08/24/21 131 lb (59.4 kg)    BP Readings from Last 3 Encounters:  10/30/21 130/64  08/16/21 122/68  06/14/21 140/68    Physical Exam- Limited  Constitutional:  Body mass index is 23.28 kg/m. , not in acute distress, normal state of mind Eyes:  EOMI, no exophthalmos Neck: Supple Cardiovascular: RRR, no murmurs, rubs, or gallops, no edema Respiratory: Adequate breathing efforts, no crackles, rales, rhonchi, or wheezing Musculoskeletal: no gross deformities, strength intact in all four extremities, no gross restriction of joint movements Skin:  no rashes, no hyperemia Neurological: no tremor with outstretched hands    CMP ( most  recent) CMP     Component Value Date/Time   NA 140 06/07/2021 1229   NA 137 03/27/2013 0545   K 4.4 06/07/2021 1229   K 3.9 03/27/2013 0545   CL 104 06/07/2021 1229   CL 103 03/27/2013 0545   CO2 32 06/07/2021 1229   CO2 27 03/27/2013 0545   GLUCOSE 172 (H) 06/07/2021 1229   GLUCOSE 122 (H) 03/27/2013 0545   BUN 21 06/07/2021 1229   BUN 10 03/27/2013 0545   CREATININE 0.93 06/07/2021 1229   CREATININE 0.99 (H) 04/24/2021 1043   CALCIUM 10.5 06/07/2021 1229   CALCIUM 8.9 03/27/2013 0545   PROT 6.7 04/24/2021 1043   PROT 7.6 10/07/2011 1316   ALBUMIN 4.5 10/21/2020 1022   ALBUMIN 4.5 10/07/2011 1316   AST 23 04/24/2021 1043   AST 26 10/07/2011 1316   ALT 14 04/24/2021 1043   ALT 26 10/07/2011 1316   ALKPHOS 51 10/21/2020 1022   ALKPHOS 46 (L) 10/07/2011 1316   BILITOT 0.6 04/24/2021 1043   BILITOT 0.5 10/07/2011 1316   GFRNONAA 54 (L) 03/03/2020 0000   GFRAA 63 03/03/2020 0000     Diabetic Labs (most recent): Lab Results  Component Value Date   HGBA1C 7.7 (A) 10/30/2021   HGBA1C 7.0 (A) 04/26/2021   HGBA1C 6.5 10/27/2020     Lipid Panel ( most recent) Lipid Panel     Component Value Date/Time   CHOL 139 04/24/2021 1043   CHOL 158 12/12/2020 1115   CHOL 141 10/08/2011 0457   TRIG 94 04/24/2021 1043   TRIG 179 10/08/2011 0457   HDL 51 04/24/2021 1043   HDL 52 12/12/2020 1115   HDL 37 (L) 10/08/2011 0457   CHOLHDL 2.7 04/24/2021 1043   VLDL 29.0 03/05/2019 0916   VLDL 36 10/08/2011 0457  [place] 70 04/24/2021 1043   LDLCALC 68 10/08/2011 0457      Lab Results  Component Value Date   TSH 1.94 06/07/2021   TSH 1.99 04/24/2021   TSH 1.66 03/03/2020   TSH 2.68 03/05/2019   TSH 2.96 11/06/2017   TSH 1.24 02/01/2017   TSH 1.86 02/06/2016   FREET4 1.4 04/24/2021   FREET4 1.4 03/03/2020      Assessment & Plan:   1) Type 2 diabetes complicated by stage 2 CKD  - Tracey [name] has currently controlled asymptomatic type 2 DM since  86 years of  age.  She presents today with her meter and logs showing slightly above target fasting glycemic profile.  Her POCT A1c today is 7.7%, increasing from her last visit of 7%.  She reports she has not been exercising optimally and has cut carbs out of her diet almost completely.  She does report symptoms of hypoglycemia intermittently.  She is very strict with her diet.  - I had a long discussion with her about the progressive nature of diabetes and the pathology behind its complications. -her diabetes is complicated by stage 2 CKD and she remains at a high risk for more acute and chronic complications which include CAD, CVA, CKD, retinopathy, and neuropathy. These are all discussed in detail with her.  The following Lifestyle Medicine recommendations according to [facility]) were discussed and offered to patient and she agrees to start the journey:  A. Whole Foods, Plant-based plate comprising of fruits and vegetables, plant-based proteins, whole-grain carbohydrates was discussed in detail with the patient.   A list for source of those nutrients were also provided to the patient.  Patient will use only water or unsweetened tea for hydration. B.  The need to stay away from risky substances including alcohol, smoking; obtaining 7 to 9 hours of restorative sleep, at least 150 minutes of moderate intensity exercise weekly, the importance of healthy social connections,  and stress reduction techniques were discussed. C.  A full color page of  Calorie density of various food groups per pound showing examples of each food groups was provided to the patient.  - Nutritional counseling repeated at each appointment due to patients tendency to fall back in to old habits.  - The patient admits there is a room for improvement in their diet and drink choices. -  Suggestion is made for the patient to avoid simple carbohydrates from their diet including Cakes, Sweet Desserts / Pastries,  Ice Cream, Soda (diet and regular), Sweet Tea, Candies, Chips, Cookies, Sweet Pastries, Store Bought Juices, Alcohol in Excess of 1-2 drinks a day, Artificial Sweeteners, Coffee Creamer, and "Sugar-free" Products. This will help patient to have stable blood glucose profile and potentially avoid unintended weight gain.   - I encouraged the patient to switch to unprocessed or minimally processed complex starch and increased protein intake (animal or plant source), fruits, and vegetables.   - Patient is advised to stick to a routine mealtimes to eat 3 meals a day and avoid unnecessary snacks (to snack only to correct hypoglycemia).  - I have approached her with the following individualized plan to manage  her diabetes and patient agrees:   -She is advised to continue Metformin 500 mg XR po daily with breakfast and Glipizide 2.5 mg XL PO daily with breakfast.  I discussed the need for her to incorporate more physical exercise and more complex carbs into her diet.  -She is encouraged to continue  monitoring blood glucose at least once daily, before breakfast and call the clinic if she gets readings less than 70 or greater than 200 for 3 tests in a row.  - she is not a candidate for SGLT2 inhibitors or full dose Metformin due to concurrent renal insufficiency.  - she is not a candidate for incretin therapy due to her body habitus.    - Patient specific target  A1c;  LDL, HDL, Triglycerides, and  Waist Circumference were discussed in detail.  2) Blood Pressure /Hypertension:  Her blood pressure is controlled to target.  She is advised to continue Coreg 3.125 mg po twice daily and Lisinopril 10 mg po daily.   3) Lipids/Hyperlipidemia: Her most recent lipid panel from 04/24/21 shows controlled LDL at 70.  She is advised to continue Crestor 40 mg po daily at bedtime.  Side effects and precautions discussed with her.    4)  Weight/Diet: Her Body mass index is 23.28 kg/m.  -She is not a candidate for  weight loss.  Exercise, and detailed carbohydrates information provided  -  detailed on discharge instructions.  5) Chronic Care/Health Maintenance: -she is on ACEI/ARB and Statin medications and  is encouraged to initiate and continue to follow up with Ophthalmology, Dentist,  Podiatrist at least yearly or according to recommendations, and advised to   stay away from smoking. I have recommended yearly flu vaccine and pneumonia vaccine at least every 5 years; moderate intensity exercise for up to 150 minutes weekly; and  sleep for at least 7 hours a day.  - she is advised to maintain close follow up with [facility], Wynelle Fanny, MD for primary care needs, as well as her other providers for optimal and coordinated care.      I spent 41 minutes in the care of the patient today including review of labs from [place], Lipids, Thyroid Function, Hematology (current and previous including abstractions from other facilities); face-to-face time discussing  her blood glucose readings/logs, discussing hypoglycemia and hyperglycemia episodes and symptoms, medications doses, her options of short and long term treatment based on the latest standards of care / guidelines;  discussion about incorporating lifestyle medicine;  and documenting the encounter.    Please refer to Patient Instructions for Blood Glucose Monitoring and Insulin/Medications Dosing Guide"  in media tab for additional information. Please  also refer to " Patient Self Inventory" in the Media  tab for reviewed elements of pertinent patient history.  Tracey [name] participated in the discussions, expressed understanding, and voiced agreement with the above plans.  All questions were answered to her satisfaction. she is encouraged to contact clinic should she have any questions or concerns prior to her return visit.    Follow up plan: - Return in about 4 months (around 02/27/2022) for Diabetes F/U with A1c in office, Bring meter and logs, No previsit  labs.  Rayetta Pigg, [facility] [address] [place], [place] [address] Phone: [phone] Fax: [phone]  10/30/2021, 4:42 PM

## 2021-10-30 NOTE — Patient Instructions (Signed)

## 2021-11-01 ENCOUNTER — Other Ambulatory Visit: Payer: Self-pay

## 2021-11-01 ENCOUNTER — Encounter: Payer: Self-pay | Admitting: Family Medicine

## 2021-11-01 ENCOUNTER — Ambulatory Visit (INDEPENDENT_AMBULATORY_CARE_PROVIDER_SITE_OTHER): Payer: Medicare HMO | Admitting: Family Medicine

## 2021-11-01 VITALS — BP 118/78 | HR 64 | Temp 97.8°F | Ht 63.0 in | Wt 132.0 lb

## 2021-11-01 DIAGNOSIS — E538 Deficiency of other specified B group vitamins: Secondary | ICD-10-CM | POA: Diagnosis not present

## 2021-11-01 DIAGNOSIS — Z0000 Encounter for general adult medical examination without abnormal findings: Secondary | ICD-10-CM | POA: Diagnosis not present

## 2021-11-01 DIAGNOSIS — I480 Paroxysmal atrial fibrillation: Secondary | ICD-10-CM

## 2021-11-01 DIAGNOSIS — E782 Mixed hyperlipidemia: Secondary | ICD-10-CM | POA: Diagnosis not present

## 2021-11-01 DIAGNOSIS — M8580 Other specified disorders of bone density and structure, unspecified site: Secondary | ICD-10-CM | POA: Diagnosis not present

## 2021-11-01 DIAGNOSIS — I10 Essential (primary) hypertension: Secondary | ICD-10-CM

## 2021-11-01 DIAGNOSIS — E559 Vitamin D deficiency, unspecified: Secondary | ICD-10-CM | POA: Diagnosis not present

## 2021-11-01 DIAGNOSIS — E1122 Type 2 diabetes mellitus with diabetic chronic kidney disease: Secondary | ICD-10-CM | POA: Diagnosis not present

## 2021-11-01 DIAGNOSIS — I739 Peripheral vascular disease, unspecified: Secondary | ICD-10-CM | POA: Diagnosis not present

## 2021-11-01 DIAGNOSIS — I2510 Atherosclerotic heart disease of native coronary artery without angina pectoris: Secondary | ICD-10-CM

## 2021-11-01 DIAGNOSIS — N289 Disorder of kidney and ureter, unspecified: Secondary | ICD-10-CM

## 2021-11-01 DIAGNOSIS — N182 Chronic kidney disease, stage 2 (mild): Secondary | ICD-10-CM

## 2021-11-01 DIAGNOSIS — Z1231 Encounter for screening mammogram for malignant neoplasm of breast: Secondary | ICD-10-CM

## 2021-11-01 DIAGNOSIS — E2839 Other primary ovarian failure: Secondary | ICD-10-CM | POA: Insufficient documentation

## 2021-11-01 LAB — CBC WITH DIFFERENTIAL/PLATELET
Basophils Absolute: 0 10*3/uL (ref 0.0–0.1)
Basophils Relative: 0.3 % (ref 0.0–3.0)
Eosinophils Absolute: 0.1 10*3/uL (ref 0.0–0.7)
Eosinophils Relative: 1.5 % (ref 0.0–5.0)
HCT: 34.7 % — ABNORMAL LOW (ref 36.0–46.0)
Hemoglobin: 11.7 g/dL — ABNORMAL LOW (ref 12.0–15.0)
Lymphocytes Relative: 28.6 % (ref 12.0–46.0)
Lymphs Abs: 1.4 10*3/uL (ref 0.7–4.0)
MCHC: 33.8 g/dL (ref 30.0–36.0)
MCV: 88.3 fl (ref 78.0–100.0)
Monocytes Absolute: 0.5 10*3/uL (ref 0.1–1.0)
Monocytes Relative: 9.6 % (ref 3.0–12.0)
Neutro Abs: 2.9 10*3/uL (ref 1.4–7.7)
Neutrophils Relative %: 60 % (ref 43.0–77.0)
Platelets: 156 10*3/uL (ref 150.0–400.0)
RBC: 3.94 Mil/uL (ref 3.87–5.11)
RDW: 12.9 % (ref 11.5–15.5)
WBC: 4.9 10*3/uL (ref 4.0–10.5)

## 2021-11-01 LAB — TSH: TSH: 2.44 u[IU]/mL (ref 0.35–5.50)

## 2021-11-01 LAB — COMPREHENSIVE METABOLIC PANEL
ALT: 13 U/L (ref 0–35)
AST: 21 U/L (ref 0–37)
Albumin: 4.5 g/dL (ref 3.5–5.2)
Alkaline Phosphatase: 52 U/L (ref 39–117)
BUN: 31 mg/dL — ABNORMAL HIGH (ref 6–23)
CO2: 30 mEq/L (ref 19–32)
Calcium: 10.5 mg/dL (ref 8.4–10.5)
Chloride: 104 mEq/L (ref 96–112)
Creatinine, Ser: 1.21 mg/dL — ABNORMAL HIGH (ref 0.40–1.20)
GFR: 41.53 mL/min — ABNORMAL LOW (ref >60.00)
Glucose, Bld: 188 mg/dL — ABNORMAL HIGH (ref 70–99)
Potassium: 4.8 mEq/L (ref 3.5–5.1)
Sodium: 139 mEq/L (ref 135–145)
Total Bilirubin: 0.5 mg/dL (ref 0.2–1.2)
Total Protein: 7.1 g/dL (ref 6.0–8.3)

## 2021-11-01 LAB — LIPID PANEL
Cholesterol: 125 mg/dL (ref 0–200)
HDL: 48.5 mg/dL (ref >39.00)
LDL Cholesterol: 60 mg/dL (ref 0–99)
NonHDL: 76.05
Total CHOL/HDL Ratio: 3
Triglycerides: 79 mg/dL (ref 0.0–149.0)
VLDL: 15.8 mg/dL (ref 0.0–40.0)

## 2021-11-01 LAB — VITAMIN B12: Vitamin B-12: 411 pg/mL (ref 211–911)

## 2021-11-01 LAB — VITAMIN D 25 HYDROXY (VIT D DEFICIENCY, FRACTURES): VITD: 71.1 ng/mL (ref 30.00–100.00)

## 2021-11-01 MED ORDER — PANTOPRAZOLE SODIUM 40 MG PO TBEC: 40.0000 mg | DELAYED_RELEASE_TABLET | Freq: Every day | ORAL | 3 refills | Status: DC

## 2021-11-01 NOTE — Assessment & Plan Note (Signed)
No clinical changes cardiol care plavix

## 2021-11-01 NOTE — Assessment & Plan Note (Signed)
Disc goals for lipids and reasons to control them Rev last labs with pt Rev low sat fat diet in detail crestor 40 mg daily -tolerates well

## 2021-11-01 NOTE — Patient Instructions (Addendum)
If you are interested in the new shingles vaccine (Shingrix) - call your local pharmacy to check on coverage and availability  If affordable, get on a wait list at your pharmacy to get the vaccine.   I recommend getting the bivalent covid booster at pharmacy   Call and schedule your mammogram and bone density test at [facility]  Please call the location of your choice from the menu below to schedule your Mammogram and/or Bone Density appointment.    [facility]                      Phone:  [phone], [place] [address]                                                             Services: Traditional and 3D Mammogram, [place] [facility]                 Phone: [phone] [address], [place] [address]    Service: Bone Density ONLY   *this site does NOT perform mammograms  [place]                        Phone:  [phone] [address], [place] [address]                                            Services:  3D Mammogram and [place] at [facility]   Phone:  [phone]   [place], [place] [address]                                            Services: 3D Mammogram and Bone Density  [facility])  Phone:  [phone]   [address] [place], [place] [address]                                              Services:  3D Mammogram and Bone Density

## 2021-11-01 NOTE — Assessment & Plan Note (Signed)
Sees endocrinology  Last a1c 7.7  Trying to avoid low glucose Nl foot exam today  [facility]

## 2021-11-01 NOTE — Progress Notes (Signed)
Subjective:    Patient ID: Tracey [name], female    DOB: [birth year], 86 y.o.   MRN: [id number]  This visit occurred during the SARS-CoV-2 public health emergency.  Safety protocols were in place, including screening questions prior to the visit, additional usage of staff PPE, and extensive cleaning of exam room while observing appropriate contact time as indicated for disinfecting solutions.   HPI Here for health maintenance exam and to review chronic medical problems    Wt Readings from Last 3 Encounters:  11/01/21 132 lb (59.9 kg)  10/30/21 131 lb 6.4 oz (59.6 kg)  09/25/21 130 lb (59 kg)   23.38 kg/m  Doing ok overall  Does not sleep well/wakes up and cannot go back to sleep Stays pretty active all day   Tried melatonin for a week  One cup of coffee daily  Some decaf tea    Had amw on 12/8 Reviewed   Zoster status : needs to get coverage info/wants to get shingrix  03/2013 Td Flu shot oct  Pna vaccine utd Covid vaccinated: has not had newest booster  Eye exam 04/2021  Mammogram 2019  -wants to keep getting them  Self breast exam-no lumps   Colonoscopy 2017 polyp  Declines further screening   Dexa 2018  osteopenia  Falls-several (? From irreg HR) Fractures-distal radius fracture this fall  Supplements: ca and D  Exercise : getting out and walking when weather permits  Does home exercises/ PT as well    HTN in setting of CAD and a fib and PAD  bp is stable today  No cp or palpitations or headaches or edema  No side effects to medicines  BP Readings from Last 3 Encounters:  11/01/21 118/78  10/30/21 130/64  08/16/21 122/68     Pulse Readings from Last 3 Encounters:  11/01/21 64  10/30/21 (!) 56  08/16/21 60   Heart issues are stable Has f/u in march   DM2 Sees endocrinology  Had a f/u Monday  A1c is 7.7  Letting sugar be a little higher - and was told she needs to eat more in general  Careful with bread/potatoes and pasta  Works on protein  intake    Hyperlipidemia Lab Results  Component Value Date   CHOL 139 04/24/2021   HDL 51 04/24/2021   LDLCALC 70 04/24/2021   TRIG 94 04/24/2021   CHOLHDL 2.7 04/24/2021   Crestor 40 mg daily  Limits beef  Eats lower fat meats Lots of veggies   Lab Results  Component Value Date   CREATININE 0.93 06/07/2021   BUN 21 06/07/2021   NA 140 06/07/2021   K 4.4 06/07/2021   CL 104 06/07/2021   CO2 32 06/07/2021     Vit B12 def Lab Results  Component Value Date   [id number] 502 (H) 06/07/2021   Shot every other mo  GERD/gastritis Protonix qd to every other day  Patient Active Problem List   Diagnosis Date Noted   Screening mammogram, encounter for 11/01/2021   Estrogen deficiency 11/01/2021   Acute maxillary sinusitis 09/25/2021   Painful urination 08/16/2021   Arthritis of carpometacarpal (CMC) joint of left thumb 05/19/2021   Muscle cramps 05/15/2021   Distal radius fracture, left 05/05/2021   Dysuria 04/14/2021   Exposure to COVID-19 virus 02/23/2021   Hypercalcemia 03/01/2020   General weakness 02/05/2020   Urine frequency 02/05/2020   Total knee replacement status 01/06/2020   Rectocele 09/27/2019   Vaginal atrophy 09/27/2019  Renal artery stenosis ([place]) 06/30/2019   Diarrhea 06/16/2019   Varicose veins of both lower extremities with inflammation 05/27/2019   Essential hypertension, benign 05/07/2019   Coronary artery disease of native artery of native heart with stable angina pectoris ([place]) 04/01/2019   Medicare annual wellness visit, subsequent 04/01/2019   Syncope 12/25/2018   Palpitations 12/25/2018   PAD (peripheral artery disease) ([place]) 12/25/2018   Stenosis of left carotid artery 12/25/2018   Abnormal urinalysis 10/15/2018   Primary osteoarthritis of left knee 04/27/2018   Weight loss 01/13/2018   PAF (paroxysmal atrial fibrillation) ([place]) 11/11/2017   Routine general medical examination at a health care facility 11/11/2017   Renal  insufficiency 11/11/2017   Osteopenia 04/30/2017   Anemia 02/12/2017   Vitamin D deficiency 02/03/2017   Well woman exam 11/05/2016   Vitamin B12 deficiency 11/05/2016   Change in bowel habits 03/07/2015   FH: colon cancer 03/07/2015   GERD (gastroesophageal reflux disease) 03/07/2015   Osteoarthritis 03/15/2014   Hypertension 01/22/2012   CAD (coronary artery disease) 01/22/2012   Status post aorto-coronary artery bypass graft 01/22/2012   Mixed hyperlipidemia 01/22/2012   Type 2 diabetes mellitus with stage 2 chronic kidney disease, without long-term current use of insulin ([place]) 01/22/2012   Atherosclerosis of renal artery ([place]) 01/22/2012   Past Medical History:  Diagnosis Date   Anxiety    Arthritis    Atrial fibrillation ([place])    history   Chronic kidney disease    ckd stage II   Coronary artery disease    Diabetes mellitus    non insulin dependent   Dysrhythmia    atrial fibrillation, history of   GERD (gastroesophageal reflux disease)    Heart murmur    History of kidney stones    Hyperlipidemia    Hypertension    Peripheral vascular disease ([place])    Rectocele    Syncope 09/2019   UTI (urinary tract infection)    Past Surgical History:  Procedure Laterality Date   ABDOMINAL AORTA STENT     APPENDECTOMY     BREAST BIOPSY Left    20 years ago   BREAST BIOPSY Right 2008   BREAST EXCISIONAL BIOPSY     rt 2008 lt "20 years ago"   CARDIAC CATHETERIZATION     CATARACT EXTRACTION W/PHACO Left 08/15/2015   Procedure: CATARACT EXTRACTION PHACO AND INTRAOCULAR LENS PLACEMENT ([place]);  Surgeon: Estill Cotta, MD;  Location: [facility] ORS;  Service: Ophthalmology;  Laterality: Left;  [place] 01:28 AP% 26.4 CDE 35.73 fluid pack # [id number]   COLONOSCOPY WITH PROPOFOL N/A 01/16/2016   Procedure: COLONOSCOPY WITH PROPOFOL;  Surgeon: Manya Silvas, MD;  Location: [facility] ENDOSCOPY;  Service: Endoscopy;  Laterality: N/A;   CORONARY ANGIOPLASTY WITH STENT PLACEMENT     CORONARY  ARTERY BYPASS GRAFT  1995   with LIMA  to the LAD, SVG to OM1, SVG to PDA   ESOPHAGOGASTRODUODENOSCOPY (EGD) WITH PROPOFOL N/A 01/16/2016   Procedure: ESOPHAGOGASTRODUODENOSCOPY (EGD) WITH PROPOFOL;  Surgeon: Manya Silvas, MD;  Location: [facility] ENDOSCOPY;  Service: Endoscopy;  Laterality: N/A;   EYE SURGERY     JOINT REPLACEMENT Right    KNEE ARTHROPLASTY Left 01/06/2020   Procedure: COMPUTER ASSISTED TOTAL KNEE ARTHROPLASTY;  Surgeon: Dereck Leep, MD;  Location: [facility] ORS;  Service: Orthopedics;  Laterality: Left;   LOWER EXTREMITY ANGIOGRAPHY N/A 07/08/2019   Procedure: Abdominal Aortagraphy;  Surgeon: Algernon Huxley, MD;  Location: [place] [facility];  Service: Cardiovascular;  Laterality: N/A;  RENAL ANGIOGRAPHY Left 08/26/2019   Procedure: RENAL ANGIOGRAPHY;  Surgeon: Algernon Huxley, MD;  Location: [place] [facility];  Service: Cardiovascular;  Laterality: Left;   RENAL ARTERY STENT     left, By Dr Hulda Humphrey   REPLACEMENT TOTAL KNEE  03/25/2013   right   TONSILLECTOMY     Social History   Tobacco Use   Smoking status: Never   Smokeless tobacco: Never  Vaping Use   Vaping Use: Never used  Substance Use Topics   Alcohol use: No   Drug use: No   Family History  Problem Relation Age of Onset   Heart disease Father    Other Brother        open heart surgery   Cancer Brother    Other Brother        open heart surgery   Other Brother        open heart surgery   Hyperlipidemia Daughter    Breast cancer Neg Hx    Kidney cancer Neg Hx    Bladder Cancer Neg Hx    Allergies  Allergen Reactions   Codeine Nausea Only    Sick to her stomach   Erythromycin Nausea And Vomiting and Other (See Comments)   Tramadol Nausea Only and Other (See Comments)    Loss of appetite and did not eat   Ciprofloxacin Diarrhea    Upsets stomach   Current Outpatient Medications on File Prior to Visit  Medication Sig Dispense Refill   ACCU-CHEK GUIDE test strip USE AS INSTRUCTED TO CHECK BLOOD  SUGAR ONE TO TWO TIMES A DAY. 100 strip 2   acetaminophen (TYLENOL) 500 MG tablet Take 500 mg by mouth in the morning and at bedtime.     carvedilol (COREG) 3.125 MG tablet TAKE 1 TABLET BY MOUTH TWICE DAILY 180 tablet 0   clopidogrel (PLAVIX) 75 MG tablet TAKE 1 TABLET BY MOUTH ONCE DAILY. 90 tablet 0   CRANBERRY PO Take 1 tablet by mouth daily.     Cyanocobalamin (VITAMIN B-12 IJ) Inject as directed See admin instructions. Every two months     ezetimibe (ZETIA) 10 MG tablet TAKE 1 TABLET BY MOUTH ONCE DAILY. 90 tablet 0   glipiZIDE (GLUCOTROL XL) 2.5 MG 24 hr tablet TAKE 1 TABLET BY MOUTH ONCE DAILY. 90 tablet 0   Lancets MISC 1 each by Does not apply route 2 (two) times daily. 100 each 12   lisinopril (ZESTRIL) 10 MG tablet TAKE 1 TABLET BY MOUTH ONCE DAILY. 90 tablet 0   metFORMIN (GLUCOPHAGE-XR) 500 MG 24 hr tablet TAKE 1 TABLET BY MOUTH ONCE DAILY. 90 tablet 0   Multiple Vitamins-Minerals (MULTIVITAMIN WITH MINERALS) tablet Take 1 tablet by mouth daily.     nitroGLYCERIN (NITROSTAT) 0.4 MG SL tablet PLACE 1 TABLET (0.4 MG TOTAL) UNDER THE TONGUE EVERY 5 (FIVE) MINUTES AS NEEDED FOR CHEST PAIN. 25 tablet 0   OneTouch Delica Lancets 72Z MISC USE TO TEST TWICE A DAY 100 each 1   Probiotic Product (ALIGN) 4 MG CAPS Take 1 capsule by mouth daily.     rosuvastatin (CRESTOR) 40 MG tablet TAKE 1 TABLET BY MOUTH AT BEDTIME FOR CHOLESTEROL 90 tablet 0   No current facility-administered medications on file prior to visit.    Review of Systems  Constitutional:  Positive for fatigue. Negative for activity change, appetite change, fever and unexpected weight change.  HENT:  Negative for congestion, ear pain, rhinorrhea, sinus pressure and sore throat.   Eyes:  Negative for pain, redness and visual disturbance.  Respiratory:  Negative for cough, shortness of breath and wheezing.   Cardiovascular:  Negative for chest pain and palpitations.  Gastrointestinal:  Negative for abdominal pain, blood in  stool, constipation and diarrhea.  Endocrine: Negative for polydipsia and polyuria.  Genitourinary:  Negative for dysuria, frequency and urgency.  Musculoskeletal:  Positive for arthralgias and back pain. Negative for myalgias.  Skin:  Negative for pallor and rash.  Allergic/Immunologic: Negative for environmental allergies.  Neurological:  Negative for dizziness, syncope and headaches.  Hematological:  Negative for adenopathy. Does not bruise/bleed easily.  Psychiatric/Behavioral:  Negative for decreased concentration and dysphoric mood. The patient is not nervous/anxious.       Objective:   Physical Exam Constitutional:      General: She is not in acute distress.    Appearance: Normal appearance. She is well-developed and normal weight. She is not ill-appearing or diaphoretic.  HENT:     Head: Normocephalic and atraumatic.  Eyes:     Conjunctiva/sclera: Conjunctivae normal.     Pupils: Pupils are equal, round, and reactive to light.  Neck:     Thyroid: No thyromegaly.     Vascular: No carotid bruit or JVD.  Cardiovascular:     Rate and Rhythm: Normal rate. Rhythm irregular.     Heart sounds: Normal heart sounds.    No gallop.  Pulmonary:     Effort: Pulmonary effort is normal. No respiratory distress.     Breath sounds: Normal breath sounds. No wheezing or rales.  Abdominal:     General: Bowel sounds are normal. There is no distension or abdominal bruit.     Palpations: Abdomen is soft. There is no mass.     Tenderness: There is no abdominal tenderness. There is no guarding or rebound.  Musculoskeletal:     Cervical back: Normal range of motion and neck supple.     Right lower leg: No edema.     Left lower leg: No edema.  Lymphadenopathy:     Cervical: No cervical adenopathy.  Skin:    General: Skin is warm and dry.     Coloration: Skin is not pale.     Findings: No rash.  Neurological:     Mental Status: She is alert.     Coordination: Coordination normal.     Deep  Tendon Reflexes: Reflexes are normal and symmetric. Reflexes normal.  Psychiatric:        Mood and Affect: Mood normal.          Assessment & Plan:   Problem List Items Addressed This Visit       Cardiovascular and Mediastinum   CAD (coronary artery disease)    Doing well  No clinical changes with  On plavix HTN stable       Essential hypertension, benign    bp in fair control at this time  BP Readings from Last 1 Encounters:  11/01/21 118/78  No changes needed Most recent labs reviewed  Disc lifstyle change with low sodium diet and exercise  Under card care Coreg 3.125 bid Lisinopril 10 mg daily        Relevant Orders   CBC with Differential/Platelet   Comprehensive metabolic panel   Lipid panel   TSH   PAD (peripheral artery disease) ([facility])    No clinical changes       PAF (paroxysmal atrial fibrillation) ([facility])    No clinical changes cardiol care plavix  Endocrine   Type 2 diabetes mellitus with stage 2 chronic kidney disease, without long-term current use of insulin ([facility])    Sees endocrinology  Last a1c 7.7  Trying to avoid low glucose Nl foot exam today  utd eye care        Musculoskeletal and Integument   Osteopenia    Radial fracture in fall noted Healing  dexa ordered May consider alendronate Disc ca and D and exercise           Genitourinary   Renal insufficiency    Lab today  Good fluid intake Avoids nsaid        Other   Estrogen deficiency   Relevant Orders   DG Bone Density   Mixed hyperlipidemia    Disc goals for lipids and reasons to control them Rev last labs with pt Rev low sat fat diet in detail crestor 40 mg daily -tolerates well      Routine general medical examination at a health care facility - Primary    Reviewed health habits including diet and exercise and skin cancer prevention Reviewed appropriate screening tests for age  Also reviewed health mt list, fam hx and immunization status , as well  as social and family history   See HPI Labs ordered Plans to get shingrix vaccine at pharmacy Eye exam utd Mammogram ordered dexa ordered -disc h/o fx, may be open to tx for OP Taking ca and D  Colonoscopy utd        Screening mammogram, encounter for    Order done Pt will schedule -given #      Relevant Orders   MM 3D SCREEN BREAST BILATERAL   Vitamin B12 deficiency    B12 shot every other mo Level today      Relevant Orders   Vitamin B12   Vitamin D deficiency    Level today  Disc imp to bone and overall health      Relevant Orders   VITAMIN D 25 Hydroxy (Vit-D Deficiency, Fractures)

## 2021-11-01 NOTE — Assessment & Plan Note (Addendum)
Doing well  No clinical changes with  On plavix HTN stable

## 2021-11-01 NOTE — Assessment & Plan Note (Signed)
Order done Pt will schedule -given #

## 2021-11-01 NOTE — Assessment & Plan Note (Signed)
Radial fracture in fall noted Healing  dexa ordered May consider alendronate Disc ca and D and exercise

## 2021-11-01 NOTE — Assessment & Plan Note (Signed)
No clinical changes 

## 2021-11-01 NOTE — Assessment & Plan Note (Signed)
Lab today  Good fluid intake Avoids nsaid

## 2021-11-01 NOTE — Assessment & Plan Note (Signed)
Level today  Disc imp to bone and overall health

## 2021-11-01 NOTE — Assessment & Plan Note (Signed)
bp in fair control at this time  BP Readings from Last 1 Encounters:  11/01/21 118/78   No changes needed Most recent labs reviewed  Disc lifstyle change with low sodium diet and exercise  Under card care Coreg 3.125 bid Lisinopril 10 mg daily

## 2021-11-01 NOTE — Assessment & Plan Note (Signed)
Reviewed health habits including diet and exercise and skin cancer prevention Reviewed appropriate screening tests for age  Also reviewed health mt list, fam hx and immunization status , as well as social and family history   See HPI Labs ordered Plans to get shingrix vaccine at pharmacy Eye exam utd Mammogram ordered dexa ordered -disc h/o fx, may be open to tx for OP Taking ca and D  Colonoscopy utd

## 2021-11-01 NOTE — Assessment & Plan Note (Signed)
B12 shot every other mo Level today

## 2021-11-03 ENCOUNTER — Other Ambulatory Visit: Payer: Self-pay | Admitting: Cardiovascular Disease

## 2021-11-03 ENCOUNTER — Other Ambulatory Visit: Payer: Self-pay | Admitting: Nurse Practitioner

## 2021-11-03 DIAGNOSIS — E1122 Type 2 diabetes mellitus with diabetic chronic kidney disease: Secondary | ICD-10-CM

## 2021-11-03 DIAGNOSIS — N182 Chronic kidney disease, stage 2 (mild): Secondary | ICD-10-CM

## 2021-11-16 ENCOUNTER — Other Ambulatory Visit: Payer: Self-pay | Admitting: Nurse Practitioner

## 2021-11-20 ENCOUNTER — Other Ambulatory Visit: Payer: Self-pay | Admitting: Cardiovascular Disease

## 2021-11-21 ENCOUNTER — Encounter: Payer: Self-pay | Admitting: Family Medicine

## 2021-11-21 ENCOUNTER — Ambulatory Visit (INDEPENDENT_AMBULATORY_CARE_PROVIDER_SITE_OTHER)
Admission: RE | Admit: 2021-11-21 | Discharge: 2021-11-21 | Disposition: A | Discharge status: Home / Self Care | Payer: Medicare HMO | Source: Ambulatory Visit | Attending: Family Medicine | Admitting: Family Medicine

## 2021-11-21 ENCOUNTER — Ambulatory Visit (INDEPENDENT_AMBULATORY_CARE_PROVIDER_SITE_OTHER): Payer: Medicare HMO | Admitting: Family Medicine

## 2021-11-21 ENCOUNTER — Other Ambulatory Visit: Payer: Self-pay

## 2021-11-21 VITALS — BP 140/70 | HR 62 | Temp 98.0°F | Ht 63.0 in | Wt 131.2 lb

## 2021-11-21 DIAGNOSIS — M5442 Lumbago with sciatica, left side: Secondary | ICD-10-CM

## 2021-11-21 DIAGNOSIS — M5441 Lumbago with sciatica, right side: Secondary | ICD-10-CM

## 2021-11-21 DIAGNOSIS — M545 Low back pain, unspecified: Secondary | ICD-10-CM | POA: Diagnosis not present

## 2021-11-21 DIAGNOSIS — M419 Scoliosis, unspecified: Secondary | ICD-10-CM

## 2021-11-21 MED ORDER — METHOCARBAMOL 500 MG PO TABS: 500.0000 mg | ORAL_TABLET | Freq: Every evening | ORAL | 0 refills | Status: DC | PRN

## 2021-11-21 NOTE — Patient Instructions (Signed)
You can try topical voltaren gel to the painful area  ?Tylenol ?You can try methocarbamol (muscle relaxer) at night if needed but you may need assistance to prevent falls  ? ?Xray now  ? ?Plan to follow  ? ?

## 2021-11-21 NOTE — Progress Notes (Signed)
Subjective:    Patient ID: Tracey [name], female    DOB: [birth year], 86 y.o.   MRN: [id number]  This visit occurred during the SARS-CoV-2 public health emergency.  Safety protocols were in place, including screening questions prior to the visit, additional usage of staff PPE, and extensive cleaning of exam room while observing appropriate contact time as indicated for disinfecting solutions.   HPI Pt presents for back pain   Wt Readings from Last 3 Encounters:  11/21/21 131 lb 4 oz (59.5 kg)  11/01/21 132 lb (59.9 kg)  10/30/21 131 lb 6.4 oz (59.6 kg)   23.25 kg/m   Back hurts (less than 3 weeks)  No trauma  Low back  -in the center   Sharp in nature  It throbs  Radiates down both legs  She has to walk hunched over    (takes hours to warm up)  Is hard to do her exercises  Hurts worse to bend forward   Last fall was in December  No history of back problems  Has some kyphosis from osteopenia     Is able to sleep  Hurts to roll over  Uses a brace  Heat feels good    Taking tylenol 1000 mg as needed  No nsaids due to renal function  Used some heat  No topicals    Patient Active Problem List   Diagnosis Date Noted   Low back pain 11/21/2021   Screening mammogram, encounter for 11/01/2021   Estrogen deficiency 11/01/2021   Acute maxillary sinusitis 09/25/2021   Painful urination 08/16/2021   Arthritis of carpometacarpal (CMC) joint of left thumb 05/19/2021   Muscle cramps 05/15/2021   Distal radius fracture, left 05/05/2021   Dysuria 04/14/2021   Exposure to COVID-19 virus 02/23/2021   Hypercalcemia 03/01/2020   General weakness 02/05/2020   Urine frequency 02/05/2020   Total knee replacement status 01/06/2020   Rectocele 09/27/2019   Vaginal atrophy 09/27/2019   Renal artery stenosis (HCC) 06/30/2019   Diarrhea 06/16/2019   Varicose veins of both lower extremities with inflammation 05/27/2019   Essential hypertension, benign 05/07/2019   Coronary  artery disease of native artery of native heart with stable angina pectoris (HCC) 04/01/2019   Medicare annual wellness visit, subsequent 04/01/2019   Syncope 12/25/2018   Palpitations 12/25/2018   PAD (peripheral artery disease) (HCC) 12/25/2018   Stenosis of left carotid artery 12/25/2018   Abnormal urinalysis 10/15/2018   Primary osteoarthritis of left knee 04/27/2018   Weight loss 01/13/2018   PAF (paroxysmal atrial fibrillation) (HCC) 11/11/2017   Routine general medical examination at a health care facility 11/11/2017   Renal insufficiency 11/11/2017   Osteopenia 04/30/2017   Anemia 02/12/2017   Vitamin D deficiency 02/03/2017   Well woman exam 11/05/2016   Vitamin B12 deficiency 11/05/2016   Change in bowel habits 03/07/2015   FH: colon cancer 03/07/2015   GERD (gastroesophageal reflux disease) 03/07/2015   Osteoarthritis 03/15/2014   Hypertension 01/22/2012   CAD (coronary artery disease) 01/22/2012   Status post aorto-coronary artery bypass graft 01/22/2012   Mixed hyperlipidemia 01/22/2012   Type 2 diabetes mellitus with stage 2 chronic kidney disease, without long-term current use of insulin (HCC) 01/22/2012   Atherosclerosis of renal artery (HCC) 01/22/2012   Past Medical History:  Diagnosis Date   Anxiety    Arthritis    Atrial fibrillation (HCC)    history   Chronic kidney disease    ckd stage II   Coronary artery disease  Diabetes mellitus    non insulin dependent   Dysrhythmia    atrial fibrillation, history of   GERD (gastroesophageal reflux disease)    Heart murmur    History of kidney stones    Hyperlipidemia    Hypertension    Peripheral vascular disease (HCC)    Rectocele    Syncope 09/2019   UTI (urinary tract infection)    Past Surgical History:  Procedure Laterality Date   ABDOMINAL AORTA STENT     APPENDECTOMY     BREAST BIOPSY Left    20 years ago   BREAST BIOPSY Right 2008   BREAST EXCISIONAL BIOPSY     rt 2008 lt "20 years ago"    CARDIAC CATHETERIZATION     CATARACT EXTRACTION W/PHACO Left 08/15/2015   Procedure: CATARACT EXTRACTION PHACO AND INTRAOCULAR LENS PLACEMENT (IOC);  Surgeon: Sallee Lange, MD;  Location: [facility] ORS;  Service: Ophthalmology;  Laterality: Left;  [place] 01:28 AP% 26.4 CDE 35.73 fluid pack # [id number]   COLONOSCOPY WITH PROPOFOL N/A 01/16/2016   Procedure: COLONOSCOPY WITH PROPOFOL;  Surgeon: Scot Jun, MD;  Location: [facility] ENDOSCOPY;  Service: Endoscopy;  Laterality: N/A;   CORONARY ANGIOPLASTY WITH STENT PLACEMENT     CORONARY ARTERY BYPASS GRAFT  1995   with LIMA  to the LAD, SVG to OM1, SVG to PDA   ESOPHAGOGASTRODUODENOSCOPY (EGD) WITH PROPOFOL N/A 01/16/2016   Procedure: ESOPHAGOGASTRODUODENOSCOPY (EGD) WITH PROPOFOL;  Surgeon: Scot Jun, MD;  [facility] ENDOSCOPY;  Service: Endoscopy;  Laterality: N/A;   EYE SURGERY     JOINT REPLACEMENT Right    KNEE ARTHROPLASTY Left 01/06/2020   Procedure: COMPUTER ASSISTED TOTAL KNEE ARTHROPLASTY;  Surgeon: Donato Heinz, MD;  Location: [facility] ORS;  Service: Orthopedics;  Laterality: Left;   LOWER EXTREMITY ANGIOGRAPHY N/A 07/08/2019   Procedure: Abdominal Aortagraphy;  Surgeon: Annice Needy, MD;  Location: [facility] INVASIVE CV LAB;  Service: Cardiovascular;  Laterality: N/A;   RENAL ANGIOGRAPHY Left 08/26/2019   Procedure: RENAL ANGIOGRAPHY;  Surgeon: Annice Needy, MD;  Location: [facility] INVASIVE CV LAB;  Service: Cardiovascular;  Laterality: Left;   RENAL ARTERY STENT     left, By Dr Earnestine Leys   REPLACEMENT TOTAL KNEE  03/25/2013   right   TONSILLECTOMY     Social History   Tobacco Use   Smoking status: Never   Smokeless tobacco: Never  Vaping Use   Vaping Use: Never used  Substance Use Topics   Alcohol use: No   Drug use: No   Family History  Problem Relation Age of Onset   Heart disease Father    Other Brother        open heart surgery   Cancer Brother    Other Brother        open heart surgery   Other Brother        open  heart surgery   Hyperlipidemia Daughter    Breast cancer Neg Hx    Kidney cancer Neg Hx    Bladder Cancer Neg Hx    Allergies  Allergen Reactions   Codeine Nausea Only    Sick to her stomach   Erythromycin Nausea And Vomiting and Other (See Comments)   Tramadol Nausea Only and Other (See Comments)    Loss of appetite and did not eat   Ciprofloxacin Diarrhea    Upsets stomach   Current Outpatient Medications on File Prior to Visit  Medication Sig Dispense Refill   ACCU-CHEK GUIDE test strip USE  AS INSTRUCTED TO CHECK BLOOD SUGAR ONE TO TWO TIMES A DAY. 100 strip 2   acetaminophen (TYLENOL) 500 MG tablet Take 500 mg by mouth in the morning and at bedtime.     carvedilol (COREG) 3.125 MG tablet TAKE 1 TABLET BY MOUTH TWICE DAILY 180 tablet 0   clopidogrel (PLAVIX) 75 MG tablet TAKE 1 TABLET BY MOUTH ONCE DAILY. 90 tablet 0   CRANBERRY PO Take 1 tablet by mouth daily.     Cyanocobalamin (VITAMIN B-12 IJ) Inject as directed See admin instructions. Every two months     ezetimibe (ZETIA) 10 MG tablet TAKE 1 TABLET BY MOUTH ONCE DAILY. 90 tablet 0   glipiZIDE (GLUCOTROL XL) 2.5 MG 24 hr tablet TAKE 1 TABLET BY MOUTH ONCE DAILY. 90 tablet 0   Lancets MISC 1 each by Does not apply route 2 (two) times daily. 100 each 12   lisinopril (ZESTRIL) 10 MG tablet TAKE 1 TABLET BY MOUTH ONCE DAILY. 90 tablet 0   metFORMIN (GLUCOPHAGE-XR) 500 MG 24 hr tablet TAKE 1 TABLET BY MOUTH ONCE DAILY. 90 tablet 0   Multiple Vitamins-Minerals (MULTIVITAMIN WITH MINERALS) tablet Take 1 tablet by mouth daily.     nitroGLYCERIN (NITROSTAT) 0.4 MG SL tablet PLACE 1 TABLET (0.4 MG TOTAL) UNDER THE TONGUE EVERY 5 (FIVE) MINUTES AS NEEDED FOR CHEST PAIN. 25 tablet 0   OneTouch Delica Lancets 33G MISC USE TO TEST TWICE A DAY 100 each 1   pantoprazole (PROTONIX) 40 MG tablet Take 1 tablet (40 mg total) by mouth daily. 90 tablet 3   Probiotic Product (ALIGN) 4 MG CAPS Take 1 capsule by mouth daily.     rosuvastatin  (CRESTOR) 40 MG tablet TAKE 1 TABLET BY MOUTH AT BEDTIME FOR CHOLESTEROL 90 tablet 0   No current facility-administered medications on file prior to visit.      Review of Systems  Constitutional:  Negative for activity change, appetite change, fatigue, fever and unexpected weight change.  HENT:  Negative for congestion, ear pain, rhinorrhea, sinus pressure and sore throat.   Eyes:  Negative for pain, redness and visual disturbance.  Respiratory:  Negative for cough, shortness of breath and wheezing.   Cardiovascular:  Negative for chest pain and palpitations.  Gastrointestinal:  Negative for abdominal pain, blood in stool, constipation and diarrhea.  Endocrine: Negative for polydipsia and polyuria.  Genitourinary:  Negative for dysuria, frequency and urgency.  Musculoskeletal:  Positive for back pain. Negative for arthralgias, joint swelling and myalgias.  Skin:  Negative for pallor and rash.  Allergic/Immunologic: Negative for environmental allergies.  Neurological:  Negative for dizziness, syncope and headaches.  Hematological:  Negative for adenopathy. Does not bruise/bleed easily.  Psychiatric/Behavioral:  Negative for decreased concentration and dysphoric mood. The patient is not nervous/anxious.       Objective:   Physical Exam Constitutional:      General: She is not in acute distress.    Appearance: She is well-developed.  HENT:     Head: Normocephalic and atraumatic.  Eyes:     General: No scleral icterus.    Conjunctiva/sclera: Conjunctivae normal.     Pupils: Pupils are equal, round, and reactive to light.  Cardiovascular:     Rate and Rhythm: Normal rate and regular rhythm.  Pulmonary:     Effort: Pulmonary effort is normal.     Breath sounds: Normal breath sounds. No wheezing or rales.  Abdominal:     General: Bowel sounds are normal. There is no distension.  Palpations: Abdomen is soft.     Tenderness: There is no abdominal tenderness.  Musculoskeletal:         General: Tenderness present.     Cervical back: Normal range of motion and neck supple.     Lumbar back: Spasms and tenderness present. No edema or bony tenderness. Decreased range of motion. Negative right straight leg raise test and negative left straight leg raise test.     Comments: No midline spinal tenderness Mild kyphosis and scoliosis  Some bilateral lumbar muscular tenderness  SLR causes pain in back but not LEs  No neuro changes  Gait is slow and guarded due to back pain   Lymphadenopathy:     Cervical: No cervical adenopathy.  Skin:    General: Skin is warm and dry.     Coloration: Skin is not pale.     Findings: No erythema or rash.  Neurological:     Mental Status: She is alert.     Cranial Nerves: No cranial nerve deficit.     Sensory: No sensory deficit.     Motor: No atrophy or abnormal muscle tone.     Coordination: Coordination normal.     Deep Tendon Reflexes: Reflexes are normal and symmetric.     Comments: Negative SLR  Psychiatric:        Mood and Affect: Mood normal.  .        Assessment & Plan:   Problem List Items Addressed This Visit       Other   Low back pain    Moderate to severe low back pain in the middle rad to both LEs in pt with low bone mass but no trauma  No spinal tenderness or neuro changes on exam  Using heat and tylenol   Xray ordered stat -pending reading   Heat Tylenol Px methocarbamol to try at night with great caution of sedation  Diff includes arthritis/disc dz/spinal compression fracture       Relevant Medications   methocarbamol (ROBAXIN) 500 MG tablet   Other Relevant Orders   DG Lumbar Spine Complete (Completed)

## 2021-11-21 NOTE — Assessment & Plan Note (Signed)
Moderate to severe low back pain in the middle rad to both LEs in pt with low bone mass but no trauma  ?No spinal tenderness or neuro changes on exam  ?Using heat and tylenol  ? ?Xray ordered stat -pending reading  ? ?Heat ?Tylenol ?Px methocarbamol to try at night with great caution of sedation  ?Diff includes arthritis/disc dz/spinal compression fracture  ?

## 2021-11-24 ENCOUNTER — Ambulatory Visit: Payer: Medicare Other | Admitting: Family Medicine

## 2021-11-24 DIAGNOSIS — M419 Scoliosis, unspecified: Secondary | ICD-10-CM | POA: Insufficient documentation

## 2021-11-24 NOTE — Addendum Note (Signed)
Addended by: Loura Pardon A on: 11/24/2021 04:35 PM ? ? Modules accepted: Orders ? ?

## 2021-12-04 DIAGNOSIS — M47816 Spondylosis without myelopathy or radiculopathy, lumbar region: Secondary | ICD-10-CM | POA: Diagnosis not present

## 2021-12-06 ENCOUNTER — Other Ambulatory Visit: Payer: Self-pay | Admitting: Physical Medicine and Rehabilitation

## 2021-12-06 DIAGNOSIS — M545 Low back pain, unspecified: Secondary | ICD-10-CM

## 2021-12-08 ENCOUNTER — Other Ambulatory Visit: Payer: Self-pay | Admitting: Cardiovascular Disease

## 2021-12-11 NOTE — Progress Notes (Signed)
?  ? ? ? ?Date:  12/12/2021  ? ?ID:  Tracey [name], DOB March 10, 1936, MRN [id number] ? ?Patient Location:  ?[place] ?[address] [place] [id number]  ? ?Provider location:   ?[place], [employer] office ? ?PCP:  Tower, Wynelle Fanny, MD  ?Cardiologist:  [facility] ? ?Chief Complaint  ?Patient presents with  ? 6 month follow up   ?  "Doing well." Medications reviewed by the patient verbally.   ? ? ? ?History of Present Illness:   ? ?Tracey [name] is a 86 y.o. female  past medical history of ?coronary artery disease, bypass surgery in 1995,  ?diabetes  ?stent to her graft to the OM several years ago with chest pain January 2013  ?repeat stenting to the graft to the OM with a Xience 3.5 x 18 mm stent  ?delivery of a shock for  resuscitation during the procedure),  ?left renal PCI in 1998, redo PCI March 2010 followed by [name],  ?History of near syncope spells ?who presents for routine followup of her coronary artery disease   ?She  lost her husband in 2016 ? ?In follow-up today reports that she is suffering from severe back pain ?Has MRI planned ?Has significant sciatica in AM, takes her some time to warm up, little bit better in the day ?Followed in [place] ? ?History of Bradycardia, denies orthostasis symptoms ?No near syncope ? ?A1C 7.7 up from 6 range ?Before prednisone, may be higher ? ?Legs throbbing, etiology unclear, concerning for myalgias ? ?EKG personally reviewed by myself on todays visit ?Sinus bradycardia rate 52 bpm poor R wave progression through the anterior precordial leads ? ?Other past medical history reviewed ?Prior history of near syncope/syncope episodes ?Several falls ?1 episode reports she was in her Backyard, had a "quick dizzy spell", fell face down ? ?Another episode, 6 Am, went to the bathroom, was walking back to bed, had a "flash spell", fell, broke hand ? ?Monday, "flash" spell, felt very dizzy ?Denies vertigo ? ?Followed by [name] ?08/25/2020, vascular procedure reviewed and  discussed with her in detail ?Atherosclerotic occlusive disease bilateral lower extremities with rest pain and lifestyle limiting claudication; recurrent left renal artery stenosis; renovascular hypertension ? Percutaneous transluminal angioplasty and stent placement pararenal aorta ?  Percutaneous transluminal angioplasty and stent placement left renal artery ? ? ?Carotid ?Carotid artery ultrasound showed 1-39% RICA stenosis and 96-29% LICA stenosis.  ? ?Echo: ?Normal EF 65% ?RVSP 32 ?Mild to moderate MR ? ? ?Past Medical History:  ?Diagnosis Date  ? Anxiety   ? Arthritis   ? Atrial fibrillation ([place])   ? history  ? Chronic kidney disease   ? ckd stage II  ? Coronary artery disease   ? Diabetes mellitus   ? non insulin dependent  ? Dysrhythmia   ? atrial fibrillation, history of  ? GERD (gastroesophageal reflux disease)   ? Heart murmur   ? History of kidney stones   ? Hyperlipidemia   ? Hypertension   ? Peripheral vascular disease ([place])   ? Rectocele   ? Syncope 09/2019  ? UTI (urinary tract infection)   ? ?Past Surgical History:  ?Procedure Laterality Date  ? ABDOMINAL AORTA STENT    ? APPENDECTOMY    ? BREAST BIOPSY Left   ? 20 years ago  ? BREAST BIOPSY Right 2008  ? BREAST EXCISIONAL BIOPSY    ? rt 2008 lt "20 years ago"  ? CARDIAC CATHETERIZATION    ? CATARACT EXTRACTION  W/PHACO Left 08/15/2015  ? Procedure: CATARACT EXTRACTION PHACO AND INTRAOCULAR LENS PLACEMENT (IOC);  Surgeon: Estill Cotta, MD;  Location: [facility] ORS;  Service: Ophthalmology;  Laterality: Left;  [place] 01:28 ?AP% 26.4 ?CDE 35.73 ?fluid pack # [id number]  ? COLONOSCOPY WITH PROPOFOL N/A 01/16/2016  ? Procedure: COLONOSCOPY WITH PROPOFOL;  Surgeon: Manya Silvas, MD;  Location[facility] ENDOSCOPY;  Service: Endoscopy;  Laterality: N/A;  ? CORONARY ANGIOPLASTY WITH STENT PLACEMENT    ? CORONARY ARTERY BYPASS GRAFT  1995  ? with LIMA  to the LAD, SVG to OM1, SVG to PDA  ? ESOPHAGOGASTRODUODENOSCOPY (EGD) WITH PROPOFOL N/A 01/16/2016  ? Procedure:  ESOPHAGOGASTRODUODENOSCOPY (EGD) WITH PROPOFOL;  Surgeon: Manya Silvas, MD;  Location: [facility] ENDOSCOPY;  Service: Endoscopy;  Laterality: N/A;  ? EYE SURGERY    ? JOINT REPLACEMENT Right   ? KNEE ARTHROPLASTY Left 01/06/2020  ? Procedure: COMPUTER ASSISTED TOTAL KNEE ARTHROPLASTY;  Surgeon: Dereck Leep, MD;  Location: [facility] ORS;  Service: Orthopedics;  Laterality: Left;  ? LOWER EXTREMITY ANGIOGRAPHY N/A 07/08/2019  ? Procedure: Abdominal Aortagraphy;  Surgeon: Algernon Huxley, MD;  Location: [place] CV LAB;  Service: Cardiovascular;  Laterality: N/A;  ? RENAL ANGIOGRAPHY Left 08/26/2019  ? Procedure: RENAL ANGIOGRAPHY;  Surgeon: Algernon Huxley, MD;  Location: [place] CV LAB;  Service: Cardiovascular;  Laterality: Left;  ? RENAL ARTERY STENT    ? left, By Dr Hulda Humphrey  ? REPLACEMENT TOTAL KNEE  03/25/2013  ? right  ? TONSILLECTOMY    ?  ? ?Current Meds  ?Medication Sig  ? ACCU-CHEK GUIDE test strip USE AS INSTRUCTED TO CHECK BLOOD SUGAR ONE TO TWO TIMES A DAY.  ? acetaminophen (TYLENOL) 500 MG tablet Take 500 mg by mouth in the morning and at bedtime.  ? carvedilol (COREG) 3.125 MG tablet TAKE 1 TABLET BY MOUTH TWICE DAILY  ? clopidogrel (PLAVIX) 75 MG tablet TAKE 1 TABLET BY MOUTH ONCE DAILY.  ? CRANBERRY PO Take 1 tablet by mouth daily.  ? Cyanocobalamin (VITAMIN B-12 IJ) Inject as directed See admin instructions. Every two months  ? ezetimibe (ZETIA) 10 MG tablet TAKE 1 TABLET BY MOUTH ONCE DAILY.  ? glipiZIDE (GLUCOTROL XL) 2.5 MG 24 hr tablet TAKE 1 TABLET BY MOUTH ONCE DAILY.  ? Lancets MISC 1 each by Does not apply route 2 (two) times daily.  ? lisinopril (ZESTRIL) 10 MG tablet TAKE 1 TABLET BY MOUTH ONCE DAILY.  ? metFORMIN (GLUCOPHAGE-XR) 500 MG 24 hr tablet TAKE 1 TABLET BY MOUTH ONCE DAILY.  ? Multiple Vitamins-Minerals (MULTIVITAMIN WITH MINERALS) tablet Take 1 tablet by mouth daily.  ? nitroGLYCERIN (NITROSTAT) 0.4 MG SL tablet PLACE 1 TABLET (0.4 MG TOTAL) UNDER THE TONGUE EVERY 5 (FIVE) MINUTES  AS NEEDED FOR CHEST PAIN.  ? OneTouch Delica Lancets 41L MISC USE TO TEST TWICE A DAY  ? pantoprazole (PROTONIX) 40 MG tablet Take 1 tablet (40 mg total) by mouth daily.  ? Probiotic Product (ALIGN) 4 MG CAPS Take 1 capsule by mouth daily.  ? rosuvastatin (CRESTOR) 40 MG tablet TAKE 1 TABLET BY MOUTH AT BEDTIME FOR CHOLESTEROL  ?  ? ?Allergies:   Codeine, Erythromycin, Tramadol, and Ciprofloxacin  ? ?Family Hx: ?The patient's family history includes Cancer in her brother; Heart disease in her father; Hyperlipidemia in her daughter; Other in her brother, brother, and brother. There is no history of Breast cancer, Kidney cancer, or Bladder Cancer. ? ?ROS:   ?Please see the history of present illness.    ?  Review of Systems  ?Constitutional: Negative.   ?HENT: Negative.    ?Respiratory: Negative.    ?Cardiovascular: Negative.   ?Gastrointestinal: Negative.   ?Musculoskeletal: Negative.   ?Neurological: Negative.   ?Psychiatric/Behavioral: Negative.    ?All other systems reviewed and are negative.  ? ?Labs/Other Tests and Data Reviewed:   ? ?Recent Labs: ?11/01/2021: ALT 13; BUN 31; Creatinine, Ser 1.21; Hemoglobin 11.7; Platelets 156.0; Potassium 4.8; Sodium 139; TSH 2.44  ? ?Recent Lipid Panel ?Lab Results  ?Component Value Date/Time  ? CHOL 125 11/01/2021 10:38 AM  ? CHOL 158 12/12/2020 11:15 AM  ? CHOL 141 10/08/2011 04:57 AM  ? TRIG 79.0 11/01/2021 10:38 AM  ? TRIG 179 10/08/2011 04:57 AM  ? HDL 48.50 11/01/2021 10:38 AM  ? HDL 52 12/12/2020 11:15 AM  ? HDL 37 (L) 10/08/2011 04:57 AM  ? CHOLHDL 3 11/01/2021 10:38 AM  ? LDLCALC 60 11/01/2021 10:38 AM  ? LDLCALC 70 04/24/2021 10:43 AM  ? LDLCALC 68 10/08/2011 04:57 AM  ? ? ?Wt Readings from Last 3 Encounters:  ?12/12/21 133 lb 4 oz (60.4 kg)  ?11/21/21 131 lb 4 oz (59.5 kg)  ?11/01/21 132 lb (59.9 kg)  ?  ? ?Exam:   ? ?BP 118/60 (BP Location: Left Arm, Patient Position: Sitting, Cuff Size: Normal)   Pulse (!) 52   Ht '5\' 3"'$  (1.6 m)   Wt 133 lb 4 oz (60.4 kg)   SpO2  98%   BMI 23.60 kg/m?  ?Constitutional:  oriented to person, place, and time. No distress.  ?HENT:  ?Head: Grossly normal ?Eyes:  no discharge. No scleral icterus.  ?Neck: No JVD, no carotid bruits  ?Cardiovascular:

## 2021-12-12 ENCOUNTER — Other Ambulatory Visit: Payer: Self-pay

## 2021-12-12 ENCOUNTER — Ambulatory Visit (INDEPENDENT_AMBULATORY_CARE_PROVIDER_SITE_OTHER): Payer: Medicare HMO | Admitting: Cardiovascular Disease

## 2021-12-12 ENCOUNTER — Encounter: Payer: Self-pay | Admitting: Cardiovascular Disease

## 2021-12-12 VITALS — BP 118/60 | HR 52 | Ht 63.0 in | Wt 133.2 lb

## 2021-12-12 DIAGNOSIS — R55 Syncope and collapse: Secondary | ICD-10-CM

## 2021-12-12 DIAGNOSIS — I700 Atherosclerosis of aorta: Secondary | ICD-10-CM | POA: Diagnosis not present

## 2021-12-12 DIAGNOSIS — I471 Supraventricular tachycardia: Secondary | ICD-10-CM | POA: Diagnosis not present

## 2021-12-12 DIAGNOSIS — E0800 Diabetes mellitus due to underlying condition with hyperosmolarity without nonketotic hyperglycemic-hyperosmolar coma (NKHHC): Secondary | ICD-10-CM

## 2021-12-12 DIAGNOSIS — I739 Peripheral vascular disease, unspecified: Secondary | ICD-10-CM | POA: Diagnosis not present

## 2021-12-12 DIAGNOSIS — R002 Palpitations: Secondary | ICD-10-CM

## 2021-12-12 DIAGNOSIS — I701 Atherosclerosis of renal artery: Secondary | ICD-10-CM

## 2021-12-12 DIAGNOSIS — I25118 Atherosclerotic heart disease of native coronary artery with other forms of angina pectoris: Secondary | ICD-10-CM | POA: Diagnosis not present

## 2021-12-12 DIAGNOSIS — I10 Essential (primary) hypertension: Secondary | ICD-10-CM | POA: Diagnosis not present

## 2021-12-12 DIAGNOSIS — I6522 Occlusion and stenosis of left carotid artery: Secondary | ICD-10-CM | POA: Diagnosis not present

## 2021-12-12 MED ORDER — CARVEDILOL 3.125 MG PO TABS: 3.1250 mg | ORAL_TABLET | Freq: Every day | ORAL | 0 refills | Status: DC

## 2021-12-12 NOTE — Patient Instructions (Addendum)
Medication Instructions:  ? ?Please decrease the coreg down to coreg 3.125 daily with extra as needed ? ?Trial hold of the crestor for 2 weeks ?If legs better, call the office ? ?If you need a refill on your cardiac medications before your next appointment, please call your pharmacy.  ? ?Lab work: ?No new labs needed ? ?Testing/Procedures: ?No new testing needed ? ?Follow-Up: ?At [facility], you and your health needs are our priority.  As part of our continuing mission to provide you with exceptional heart care, we have created designated Provider Care Teams.  These Care Teams include your primary Cardiologist (physician) and Advanced Practice Providers (APPs -  Physician Assistants and Nurse Practitioners) who all work together to provide you with the care you need, when you need it. ? ?You will need a follow up appointment in 6 months ? ?Providers on your designated Care Team:   ?Murray Hodgkins, NP ?Christell Faith, PA-C ?Cadence Kathlen Mody, PA-C ? ?COVID-19 Vaccine Information can be found at: [email or web address].[email or web address] For questions related to vaccine distribution or appointments, please email [email or web address] or call [phone].  ? ?

## 2021-12-17 ENCOUNTER — Ambulatory Visit
Admission: RE | Admit: 2021-12-17 | Discharge: 2021-12-17 | Disposition: A | Discharge status: Home / Self Care | Payer: Medicare HMO | Source: Ambulatory Visit | Attending: Physical Medicine and Rehabilitation | Admitting: Physical Medicine and Rehabilitation

## 2021-12-17 DIAGNOSIS — M48061 Spinal stenosis, lumbar region without neurogenic claudication: Secondary | ICD-10-CM | POA: Diagnosis not present

## 2021-12-17 DIAGNOSIS — M4317 Spondylolisthesis, lumbosacral region: Secondary | ICD-10-CM | POA: Diagnosis not present

## 2021-12-17 DIAGNOSIS — M4316 Spondylolisthesis, lumbar region: Secondary | ICD-10-CM | POA: Diagnosis not present

## 2021-12-17 DIAGNOSIS — M545 Low back pain, unspecified: Secondary | ICD-10-CM

## 2021-12-17 DIAGNOSIS — M2578 Osteophyte, vertebrae: Secondary | ICD-10-CM | POA: Diagnosis not present

## 2021-12-20 ENCOUNTER — Telehealth (INDEPENDENT_AMBULATORY_CARE_PROVIDER_SITE_OTHER): Payer: Medicare HMO | Admitting: Family Medicine

## 2021-12-20 ENCOUNTER — Encounter: Payer: Self-pay | Admitting: Family Medicine

## 2021-12-20 ENCOUNTER — Other Ambulatory Visit: Payer: Self-pay | Admitting: Family Medicine

## 2021-12-20 ENCOUNTER — Ambulatory Visit (INDEPENDENT_AMBULATORY_CARE_PROVIDER_SITE_OTHER)
Admission: RE | Admit: 2021-12-20 | Discharge: 2021-12-20 | Disposition: A | Discharge status: Home / Self Care | Payer: Medicare HMO | Source: Ambulatory Visit | Attending: Family Medicine | Admitting: Family Medicine

## 2021-12-20 VITALS — Temp 98.3°F | Ht 63.0 in | Wt 133.0 lb

## 2021-12-20 DIAGNOSIS — R051 Acute cough: Secondary | ICD-10-CM

## 2021-12-20 DIAGNOSIS — R059 Cough, unspecified: Secondary | ICD-10-CM | POA: Insufficient documentation

## 2021-12-20 DIAGNOSIS — R112 Nausea with vomiting, unspecified: Secondary | ICD-10-CM

## 2021-12-20 DIAGNOSIS — R509 Fever, unspecified: Secondary | ICD-10-CM | POA: Diagnosis not present

## 2021-12-20 LAB — POC INFLUENZA A&B (BINAX/QUICKVUE)
Influenza A, POC: NEGATIVE
Influenza B, POC: NEGATIVE

## 2021-12-20 LAB — POC COVID19 BINAXNOW: SARS Coronavirus 2 Ag: NEGATIVE

## 2021-12-20 NOTE — Assessment & Plan Note (Signed)
N/v and diarrhea  ?The n/v is resolved at the moment ?Possibly viral  ?Testing set up for covid and flu as well as cxr  ?Disc imp of hydration  ?Rev s/s of dehydration  ?Update if not starting to improve in a week or if worsening  -esp if abd pain/ inc in temp or dehydration  ?

## 2021-12-20 NOTE — Patient Instructions (Signed)
Drink fluids and rest ?Continue tessalon ?Watch for signs of dehydration  ? ?If nausea/vomiting return let us know  ? ?Continue to monitor temp  ?If short of breath alert [place] and go to ER ? ?The office will call to set up covid/flu testing and chest xray ?

## 2021-12-20 NOTE — Assessment & Plan Note (Signed)
3 days of prod cough/clear phlegm ?Also n/v/d and low grade temp  ? ?Ordered rapid flu and covid tests this afternoon ?Also cxr ?Pend report  ?ER precautions fisc  ?Delsym not working/ tessalon helps a little ?Keeping fluids up  ?Update if not starting to improve in a week or if worsening  ? ?

## 2021-12-20 NOTE — Progress Notes (Signed)
Virtual Visit via Video Note ? ?I connected with Tracey [name] on 12/20/21 at 12:30 PM EDT by a video enabled telemedicine application and verified that I am speaking with the correct person using two identifiers. ? ?Location: ?Patient: home ?Provider: office  ?  ?I discussed the limitations of evaluation and management by telemedicine and the availability of in person appointments. The patient expressed understanding and agreed to proceed. ? ?Parties involved in encounter ? ?Patient: Tracey [name] ? ?Provider:  Loura Pardon MD  ? ?History of Present Illness: ?Pt presents for cough with nausea/vomiting and diarrhea with fever ? ?Got up sick Monday am  ?Low temp -highest 101  ?Makes her achey  ?Cough - terrible  ?Coughs up phlegm  ?Nasty cough- all clear phlegm  ?No wheezing  ?No sob  ? ?Runny nose  ?Throat is scratchy  ? ? ?N/v times one , not nauseated today  ?Some abd pain , below navel , a little tender / has had cramping also esp yesterday ?Diarrhea - watery stool  ?Drinking a lot  ?Is weak in general  ?Not dizzy  ? ?Husband has a bad cough also and low grade temp  ? ?Tested for covid- negative  ? ?No other contacts that she knows of  ? ?No issues with eating  ? ?Otc: tylenol  ?Sipping fluids  ?Does not want nausea medicine  ?Has some tessalon pearles - do not help tremendously  ?Delsym does not help  ? ? ? ? ?A lot of back problems  ?Had MRI  ?Had to re schedule tom visit  ? ? ? ?Lab Results  ?Component Value Date  ? CREATININE 1.21 (H) 11/01/2021  ? BUN 31 (H) 11/01/2021  ? NA 139 11/01/2021  ? K 4.8 11/01/2021  ? CL 104 11/01/2021  ? CO2 30 11/01/2021  ? ?Patient Active Problem List  ? Diagnosis Date Noted  ? Nausea & vomiting 12/20/2021  ? Cough 12/20/2021  ? Kyphoscoliosis 11/24/2021  ? Low back pain 11/21/2021  ? Screening mammogram, encounter for 11/01/2021  ? Estrogen deficiency 11/01/2021  ? Acute maxillary sinusitis 09/25/2021  ? Painful urination 08/16/2021  ? Arthritis of carpometacarpal [facility]) joint  of left thumb 05/19/2021  ? Muscle cramps 05/15/2021  ? Distal radius fracture, left 05/05/2021  ? Dysuria 04/14/2021  ? Exposure to COVID-19 virus 02/23/2021  ? Hypercalcemia 03/01/2020  ? General weakness 02/05/2020  ? Urine frequency 02/05/2020  ? Total knee replacement status 01/06/2020  ? Rectocele 09/27/2019  ? Vaginal atrophy 09/27/2019  ? Renal artery stenosis ([place]) 06/30/2019  ? Diarrhea 06/16/2019  ? Varicose veins of both lower extremities with inflammation 05/27/2019  ? Essential hypertension, benign 05/07/2019  ? Coronary artery disease of native artery of native heart with stable angina pectoris ([place]) 04/01/2019  ? Medicare annual wellness visit, subsequent 04/01/2019  ? Syncope 12/25/2018  ? Palpitations 12/25/2018  ? PAD (peripheral artery disease) ([place]) 12/25/2018  ? Stenosis of left carotid artery 12/25/2018  ? Abnormal urinalysis 10/15/2018  ? Primary osteoarthritis of left knee 04/27/2018  ? Weight loss 01/13/2018  ? PAF (paroxysmal atrial fibrillation) ([place]) 11/11/2017  ? Routine general medical examination at a health care facility 11/11/2017  ? Renal insufficiency 11/11/2017  ? Osteopenia 04/30/2017  ? Anemia 02/12/2017  ? Vitamin D deficiency 02/03/2017  ? Well woman exam 11/05/2016  ? Vitamin B12 deficiency 11/05/2016  ? Change in bowel habits 03/07/2015  ? FH: colon cancer 03/07/2015  ? GERD (gastroesophageal reflux disease) 03/07/2015  ? Osteoarthritis  03/15/2014  ? Hypertension 01/22/2012  ? CAD (coronary artery disease) 01/22/2012  ? Status post aorto-coronary artery bypass graft 01/22/2012  ? Mixed hyperlipidemia 01/22/2012  ? Type 2 diabetes mellitus with stage 2 chronic kidney disease, without long-term current use of insulin ([place]) 01/22/2012  ? Atherosclerosis of renal artery ([place]) 01/22/2012  ? ?Past Medical History:  ?Diagnosis Date  ? Anxiety   ? Arthritis   ? Atrial fibrillation ([place])   ? history  ? Chronic kidney disease   ? ckd stage II  ? Coronary artery disease   ? Diabetes  mellitus   ? non insulin dependent  ? Dysrhythmia   ? atrial fibrillation, history of  ? GERD (gastroesophageal reflux disease)   ? Heart murmur   ? History of kidney stones   ? Hyperlipidemia   ? Hypertension   ? Peripheral vascular disease ([place])   ? Rectocele   ? Syncope 09/2019  ? UTI (urinary tract infection)   ? ?Past Surgical History:  ?Procedure Laterality Date  ? ABDOMINAL AORTA STENT    ? APPENDECTOMY    ? BREAST BIOPSY Left   ? 20 years ago  ? BREAST BIOPSY Right 2008  ? BREAST EXCISIONAL BIOPSY    ? rt 2008 lt "20 years ago"  ? CARDIAC CATHETERIZATION    ? CATARACT EXTRACTION W/PHACO Left 08/15/2015  ? Procedure: CATARACT EXTRACTION PHACO AND INTRAOCULAR LENS PLACEMENT (IOC);  Surgeon: Estill Cotta, MD;  Location: [facility] ORS;  Service: Ophthalmology;  Laterality: Left;  [place] 01:28 ?AP% 26.4 ?CDE 35.73 ?fluid pack # [id number]  ? COLONOSCOPY WITH PROPOFOL N/A 01/16/2016  ? Procedure: COLONOSCOPY WITH PROPOFOL;  Surgeon: Manya Silvas, MD;  Location: [facility] ENDOSCOPY;  Service: Endoscopy;  Laterality: N/A;  ? CORONARY ANGIOPLASTY WITH STENT PLACEMENT    ? CORONARY ARTERY BYPASS GRAFT  1995  ? with LIMA  to the LAD, SVG to OM1, SVG to PDA  ? ESOPHAGOGASTRODUODENOSCOPY (EGD) WITH PROPOFOL N/A 01/16/2016  ? Procedure: ESOPHAGOGASTRODUODENOSCOPY (EGD) WITH PROPOFOL;  Surgeon: Manya Silvas, MD;  Location: [facility] ENDOSCOPY;  Service: Endoscopy;  Laterality: N/A;  ? EYE SURGERY    ? JOINT REPLACEMENT Right   ? KNEE ARTHROPLASTY Left 01/06/2020  ? Procedure: COMPUTER ASSISTED TOTAL KNEE ARTHROPLASTY;  Surgeon: Dereck Leep, MD;  Location: [facility] ORS;  Service: Orthopedics;  Laterality: Left;  ? LOWER EXTREMITY ANGIOGRAPHY N/A 07/08/2019  ? Procedure: Abdominal Aortagraphy;  Surgeon: Algernon Huxley, MD;  Location: [place] CV LAB;  Service: Cardiovascular;  Laterality: N/A;  ? RENAL ANGIOGRAPHY Left 08/26/2019  ? Procedure: RENAL ANGIOGRAPHY;  Surgeon: Algernon Huxley, MD;  Location: [place] CV LAB;  Service:  Cardiovascular;  Laterality: Left;  ? RENAL ARTERY STENT    ? left, By Dr Hulda Humphrey  ? REPLACEMENT TOTAL KNEE  03/25/2013  ? right  ? TONSILLECTOMY    ? ?Social History  ? ?Tobacco Use  ? Smoking status: Never  ? Smokeless tobacco: Never  ?Vaping Use  ? Vaping Use: Never used  ?Substance Use Topics  ? Alcohol use: No  ? Drug use: No  ? ?Family History  ?Problem Relation Age of Onset  ? Heart disease Father   ? Other Brother   ?     open heart surgery  ? Cancer Brother   ? Other Brother   ?     open heart surgery  ? Other Brother   ?     open heart surgery  ? Hyperlipidemia Daughter   ?  Breast cancer Neg Hx   ? Kidney cancer Neg Hx   ? Bladder Cancer Neg Hx   ? ?Allergies  ?Allergen Reactions  ? Codeine Nausea Only  ?  Sick to her stomach  ? Erythromycin Nausea And Vomiting and Other (See Comments)  ? Tramadol Nausea Only and Other (See Comments)  ?  Loss of appetite and did not eat  ? Ciprofloxacin Diarrhea  ?  Upsets stomach  ? ?Current Outpatient Medications on File Prior to Visit  ?Medication Sig Dispense Refill  ? ACCU-CHEK GUIDE test strip USE AS INSTRUCTED TO CHECK BLOOD SUGAR ONE TO TWO TIMES A DAY. 100 strip 2  ? acetaminophen (TYLENOL) 500 MG tablet Take 500 mg by mouth in the morning and at bedtime.    ? carvedilol (COREG) 3.125 MG tablet Take 1 tablet (3.125 mg total) by mouth daily. 180 tablet 0  ? clopidogrel (PLAVIX) 75 MG tablet TAKE 1 TABLET BY MOUTH ONCE DAILY. 90 tablet 0  ? CRANBERRY PO Take 1 tablet by mouth daily.    ? Cyanocobalamin (VITAMIN B-12 IJ) Inject as directed See admin instructions. Every two months    ? ezetimibe (ZETIA) 10 MG tablet TAKE 1 TABLET BY MOUTH ONCE DAILY. 90 tablet 0  ? glipiZIDE (GLUCOTROL XL) 2.5 MG 24 hr tablet TAKE 1 TABLET BY MOUTH ONCE DAILY. 90 tablet 0  ? lisinopril (ZESTRIL) 10 MG tablet TAKE 1 TABLET BY MOUTH ONCE DAILY. 90 tablet 0  ? metFORMIN (GLUCOPHAGE-XR) 500 MG 24 hr tablet TAKE 1 TABLET BY MOUTH ONCE DAILY. 90 tablet 0  ? Multiple Vitamins-Minerals  (MULTIVITAMIN WITH MINERALS) tablet Take 1 tablet by mouth daily.    ? nitroGLYCERIN (NITROSTAT) 0.4 MG SL tablet PLACE 1 TABLET (0.4 MG TOTAL) UNDER THE TONGUE EVERY 5 (FIVE) MINUTES AS NEEDED FOR CHEST PAIN. 25

## 2021-12-28 ENCOUNTER — Ambulatory Visit
Admission: RE | Admit: 2021-12-28 | Discharge: 2021-12-28 | Disposition: A | Discharge status: Home / Self Care | Payer: Medicare HMO | Source: Ambulatory Visit | Attending: Family Medicine | Admitting: Family Medicine

## 2021-12-28 DIAGNOSIS — M810 Age-related osteoporosis without current pathological fracture: Secondary | ICD-10-CM | POA: Diagnosis not present

## 2021-12-28 DIAGNOSIS — Z780 Asymptomatic menopausal state: Secondary | ICD-10-CM | POA: Diagnosis not present

## 2021-12-28 DIAGNOSIS — E2839 Other primary ovarian failure: Secondary | ICD-10-CM | POA: Diagnosis not present

## 2021-12-28 DIAGNOSIS — Z1231 Encounter for screening mammogram for malignant neoplasm of breast: Secondary | ICD-10-CM | POA: Diagnosis not present

## 2021-12-28 DIAGNOSIS — M85852 Other specified disorders of bone density and structure, left thigh: Secondary | ICD-10-CM | POA: Diagnosis not present

## 2021-12-29 ENCOUNTER — Ambulatory Visit (INDEPENDENT_AMBULATORY_CARE_PROVIDER_SITE_OTHER): Payer: Medicare HMO

## 2021-12-29 ENCOUNTER — Ambulatory Visit (INDEPENDENT_AMBULATORY_CARE_PROVIDER_SITE_OTHER): Payer: Medicare HMO | Admitting: Vascular Surgery

## 2021-12-29 DIAGNOSIS — I739 Peripheral vascular disease, unspecified: Secondary | ICD-10-CM | POA: Diagnosis not present

## 2021-12-29 DIAGNOSIS — I701 Atherosclerosis of renal artery: Secondary | ICD-10-CM | POA: Diagnosis not present

## 2022-01-03 ENCOUNTER — Encounter (INDEPENDENT_AMBULATORY_CARE_PROVIDER_SITE_OTHER): Payer: Self-pay | Admitting: *Deleted

## 2022-01-03 ENCOUNTER — Encounter: Payer: Self-pay | Admitting: *Deleted

## 2022-01-04 DIAGNOSIS — M47816 Spondylosis without myelopathy or radiculopathy, lumbar region: Secondary | ICD-10-CM | POA: Diagnosis not present

## 2022-01-08 DIAGNOSIS — M47816 Spondylosis without myelopathy or radiculopathy, lumbar region: Secondary | ICD-10-CM | POA: Diagnosis not present

## 2022-01-29 DIAGNOSIS — M47816 Spondylosis without myelopathy or radiculopathy, lumbar region: Secondary | ICD-10-CM | POA: Diagnosis not present

## 2022-02-06 ENCOUNTER — Other Ambulatory Visit: Payer: Self-pay | Admitting: Nurse Practitioner

## 2022-02-06 ENCOUNTER — Other Ambulatory Visit: Payer: Self-pay | Admitting: Cardiovascular Disease

## 2022-02-06 DIAGNOSIS — E1122 Type 2 diabetes mellitus with diabetic chronic kidney disease: Secondary | ICD-10-CM

## 2022-02-07 DIAGNOSIS — M47816 Spondylosis without myelopathy or radiculopathy, lumbar region: Secondary | ICD-10-CM | POA: Diagnosis not present

## 2022-02-13 ENCOUNTER — Telehealth: Payer: Self-pay

## 2022-02-13 NOTE — Telephone Encounter (Signed)
[10:44 AM] Tracey [name], Tracey [name]  pain with urination, frequency and thank you, sorry to be a pest! but always appreciate all you do i know its probably a crazy am  [10:56 AM] Tracey [name]  I spoke with Dr [name] and she said she cannot see her today but she will ck a urine specimen. Terri in lab said now the lab only wants the containers here at our office instead of clean jars etc. does she  have a urine container from office or if not pt would need to come to office to give urine specimen. Shapale CMA is going to be on lookout for specimen.  [10:58 AM] Tracey [name]  okay let me talk with her and I will get right back with you!!! thank you and Thank her!  also I think my grandmother needs some recheck blood work she is past due for [name] may have already put in the future orders for it.  I know it was mostly a CMET to recheck her renal function.  can she get that done while she is there?    [11:00 AM] Tracey [name]  My grandmother had a spinal steroid injection on Wednesday last week.  They gave it to her in her lower back (SI joint area) i believe and she has been having trouble urinating every since.  May be important to mention that just in case a nerve was hit.  But she does have frequent uTI's and she is hurting in her "bladder she says" with frequency.  just think that might be important tomention in case it changes anything.  sorry  [11:02 AM] Tracey [name]  she does say it does have a bad odor  [11:11 AM] Tracey [name]  She will be there today around 3 if that works and okay she will give her BS is high while she has recently had the injections steroid which may have increased the glucose in her urine which could contribute to the infection to  [11:12 Tracey [name]  sorry that was a weird message, i just meant if still okay and the high glucose may be contributing to the UTI  [11:16 AM] Tracey [name]  in talking about the urinary issue starting after the spinal steroid  injection; she probably does need to be seen to make sure what is going on. probably be good idea to let doctors office know that gave spinal injection to and Dr [name] has got an 8:30 and 2 PM 02/14/22 for in office appt.  [11:17 AM] Tracey [name]  i am so sorry Mearl Latin this is all a whole lot.  Please forgive me  [11:18 AM] Tracey [name]  No problem but the steroid injection in spine and then problem urinating ever since could be coincidental or could be related. do you think she would want to schedule 02/14/22 at *;#0 or 2:00 ?  [11:19 AM] Tracey [name]  8:30 or 2:00 appt  [11:21 AM] Tracey [name]  2pm  [36:14 AM] Tracey [name]  I am on phone with her now and can you put needs f/u labs to in the appt notes so they will see it  [11:24 AM] Tracey [name]  yes but someone had already took 2 pm when I was putting appt in. can she do 8:30 or 12 noon on 02/14/22  [11:25 AM] Tracey [name]  Noon  [43:15 AM] Tracey [name]  OK I was able to get that one and Shapale is aware needs to add CMP  and I also  put in the phone note. also I know you will let her know UC & ED precautions and I hope she feels better. thank you.  [11:30 AM] Tracey [name]  Thank you so much!  [11:30 AM] Tracey [name]  so pt has appt with Dr [name] 02/14/22 at 12 noon in office and you are very welcome; have a blessed day.   To Dr [name] the 11/01/21 lab result pt was to return in one month but has not so would like to have CMP done while pt is at the 02/14/22  appt. Thank you.

## 2022-02-13 NOTE — Telephone Encounter (Signed)
Aware -I will see her then   Agree with ER precautions in the meantime

## 2022-02-14 ENCOUNTER — Ambulatory Visit (INDEPENDENT_AMBULATORY_CARE_PROVIDER_SITE_OTHER): Payer: Medicare HMO | Admitting: Family Medicine

## 2022-02-14 ENCOUNTER — Ambulatory Visit: Payer: Medicare HMO | Admitting: Family Medicine

## 2022-02-14 ENCOUNTER — Encounter: Payer: Self-pay | Admitting: Family Medicine

## 2022-02-14 VITALS — BP 126/68 | HR 67 | Temp 97.7°F | Ht 63.0 in | Wt 126.2 lb

## 2022-02-14 DIAGNOSIS — N3000 Acute cystitis without hematuria: Secondary | ICD-10-CM | POA: Insufficient documentation

## 2022-02-14 DIAGNOSIS — R300 Dysuria: Secondary | ICD-10-CM

## 2022-02-14 DIAGNOSIS — N289 Disorder of kidney and ureter, unspecified: Secondary | ICD-10-CM | POA: Diagnosis not present

## 2022-02-14 LAB — POC URINALSYSI DIPSTICK (AUTOMATED)
Bilirubin, UA: NEGATIVE
Blood, UA: 25
Glucose, UA: POSITIVE — AB
Ketones, UA: NEGATIVE
Nitrite, UA: NEGATIVE
Protein, UA: POSITIVE — AB
Spec Grav, UA: 1.02 (ref 1.010–1.025)
Urobilinogen, UA: 0.2 E.U./dL
pH, UA: 6 (ref 5.0–8.0)

## 2022-02-14 LAB — BASIC METABOLIC PANEL
BUN: 29 mg/dL — ABNORMAL HIGH (ref 6–23)
CO2: 29 mEq/L (ref 19–32)
Calcium: 10.3 mg/dL (ref 8.4–10.5)
Chloride: 102 mEq/L (ref 96–112)
Creatinine, Ser: 1.1 mg/dL (ref 0.40–1.20)
GFR: 46.47 mL/min — ABNORMAL LOW (ref >60.00)
Glucose, Bld: 291 mg/dL — ABNORMAL HIGH (ref 70–99)
Potassium: 4.6 mEq/L (ref 3.5–5.1)
Sodium: 137 mEq/L (ref 135–145)

## 2022-02-14 MED ORDER — CEPHALEXIN 500 MG PO CAPS: 500.0000 mg | ORAL_CAPSULE | Freq: Two times a day (BID) | ORAL | 0 refills | Status: DC

## 2022-02-14 NOTE — Assessment & Plan Note (Signed)
Last cr up to 1.21  Good fluids  Plan to re check today  May still be elevated due to uti   If so will need to check again after tx finished

## 2022-02-14 NOTE — Progress Notes (Signed)
Subjective:    Patient ID: Tracey [name], female    DOB: 1936/06/14, 86 y.o.   MRN: [id number]  HPI Pt presents for uti symptoms   Wt Readings from Last 3 Encounters:  02/14/22 126 lb 4 oz (57.3 kg)  12/20/21 133 lb (60.3 kg)  12/12/21 133 lb 4 oz (60.4 kg)   22.36 kg/m  Pt presents with dysuria and urinary frequency  Started on Friday   Burns to urinate  Odor is strong in urine  Frequency and urgency  Working to drink lots of fluids   five 16 oz bottles of water   A little nausea this am, better now   Glucose is up as well into 300s    Frustrated with her back pain /chronic  Had 2nd injection a week ago, has started to help   Gets up and gets moving after several hours   Is due for blood work   Glipizide and metformin for DM2  Sees endo   Results for orders placed or performed in visit on 02/14/22  POCT Urinalysis Dipstick (Automated)  Result Value Ref Range   Color, UA Yellow    Clarity, UA Cloudy    Glucose, UA Positive (A) Negative   Bilirubin, UA Negative    Ketones, UA Negatie    Spec Grav, UA 1.020 1.010 - 1.025   Blood, UA 25 Ery/uL    pH, UA 6.0 5.0 - 8.0   Protein, UA Positive (A) Negative   Urobilinogen, UA 0.2 0.2 or 1.0 E.U./dL   Nitrite, UA Negative    Leukocytes, UA Large (3+) (A) Negative      CKD In setting of renal artery stenosis  Lab Results  Component Value Date   CREATININE 1.21 (H) 11/01/2021   BUN 31 (H) 11/01/2021   NA 139 11/01/2021   K 4.8 11/01/2021   CL 104 11/01/2021   CO2 30 11/01/2021   Last uti was klebsiella in November Treated with bactrim   Patient Active Problem List   Diagnosis Date Noted   Acute cystitis 02/14/2022   Cough 12/20/2021   Kyphoscoliosis 11/24/2021   Low back pain 11/21/2021   Screening mammogram, encounter for 11/01/2021   Estrogen deficiency 11/01/2021   Painful urination 08/16/2021   Arthritis of carpometacarpal ([facility]) joint of left thumb 05/19/2021   Muscle cramps 05/15/2021    Distal radius fracture, left 05/05/2021   Dysuria 04/14/2021   Exposure to COVID-19 virus 02/23/2021   Hypercalcemia 03/01/2020   General weakness 02/05/2020   Urine frequency 02/05/2020   Total knee replacement status 01/06/2020   Rectocele 09/27/2019   Vaginal atrophy 09/27/2019   Renal artery stenosis ([place]) 06/30/2019   Diarrhea 06/16/2019   Varicose veins of both lower extremities with inflammation 05/27/2019   Essential hypertension, benign 05/07/2019   Coronary artery disease of native artery of native heart with stable angina pectoris ([place]) 04/01/2019   Medicare annual wellness visit, subsequent 04/01/2019   Syncope 12/25/2018   Palpitations 12/25/2018   PAD (peripheral artery disease) ([place]) 12/25/2018   Stenosis of left carotid artery 12/25/2018   Primary osteoarthritis of left knee 04/27/2018   Weight loss 01/13/2018   PAF (paroxysmal atrial fibrillation) ([place]) 11/11/2017   Routine general medical examination at a health care facility 11/11/2017   Renal insufficiency 11/11/2017   Osteopenia 04/30/2017   Anemia 02/12/2017   Vitamin D deficiency 02/03/2017   Well woman exam 11/05/2016   Vitamin B12 deficiency 11/05/2016   Change in bowel habits 03/07/2015  FH: colon cancer 03/07/2015   GERD (gastroesophageal reflux disease) 03/07/2015   Osteoarthritis 03/15/2014   Hypertension 01/22/2012   CAD (coronary artery disease) 01/22/2012   Status post aorto-coronary artery bypass graft 01/22/2012   Mixed hyperlipidemia 01/22/2012   Type 2 diabetes mellitus with stage 2 chronic kidney disease, without long-term current use of insulin ([place]) 01/22/2012   Atherosclerosis of renal artery ([place]) 01/22/2012   Past Medical History:  Diagnosis Date   Anxiety    Arthritis    Atrial fibrillation ([place])    history   Chronic kidney disease    ckd stage II   Coronary artery disease    Diabetes mellitus    non insulin dependent   Dysrhythmia    atrial fibrillation, history of    GERD (gastroesophageal reflux disease)    Heart murmur    History of kidney stones    Hyperlipidemia    Hypertension    Peripheral vascular disease ([place])    Rectocele    Syncope 09/2019   UTI (urinary tract infection)    Past Surgical History:  Procedure Laterality Date   ABDOMINAL AORTA STENT     APPENDECTOMY     BREAST BIOPSY Left    20 years ago   BREAST BIOPSY Right 2008   BREAST EXCISIONAL BIOPSY     rt 2008 lt "20 years ago"   CARDIAC CATHETERIZATION     CATARACT EXTRACTION W/PHACO Left 08/15/2015   Procedure: CATARACT EXTRACTION PHACO AND INTRAOCULAR LENS PLACEMENT ([place]);  Surgeon: Estill Cotta, MD;  Location: [facility] ORS;  Service: Ophthalmology;  Laterality: Left;  [place] 01:28 AP% 26.4 CDE 35.73 fluid pack # [id number]   COLONOSCOPY WITH PROPOFOL N/A 01/16/2016   Procedure: COLONOSCOPY WITH PROPOFOL;  Surgeon: Manya Silvas, MD;  Location: [facility] ENDOSCOPY;  Service: Endoscopy;  Laterality: N/A;   CORONARY ANGIOPLASTY WITH STENT PLACEMENT     CORONARY ARTERY BYPASS GRAFT  1995   with LIMA  to the LAD, SVG to OM1, SVG to PDA   ESOPHAGOGASTRODUODENOSCOPY (EGD) WITH PROPOFOL N/A 01/16/2016   Procedure: ESOPHAGOGASTRODUODENOSCOPY (EGD) WITH PROPOFOL;  Surgeon: Manya Silvas, MD;  Location: [facility] ENDOSCOPY;  Service: Endoscopy;  Laterality: N/A;   EYE SURGERY     JOINT REPLACEMENT Right    KNEE ARTHROPLASTY Left 01/06/2020   Procedure: COMPUTER ASSISTED TOTAL KNEE ARTHROPLASTY;  Surgeon: Dereck Leep, MD;  Location: [facility] ORS;  Service: Orthopedics;  Laterality: Left;   LOWER EXTREMITY ANGIOGRAPHY N/A 07/08/2019   Procedure: Abdominal Aortagraphy;  Surgeon: Algernon Huxley, MD;  Location: [place] CV LAB;  Service: Cardiovascular;  Laterality: N/A;   RENAL ANGIOGRAPHY Left 08/26/2019   Procedure: RENAL ANGIOGRAPHY;  Surgeon: Algernon Huxley, MD;  Location: [place] CV LAB;  Service: Cardiovascular;  Laterality: Left;   RENAL ARTERY STENT     left, By Dr Hulda Humphrey    REPLACEMENT TOTAL KNEE  03/25/2013   right   TONSILLECTOMY     Social History   Tobacco Use   Smoking status: Never   Smokeless tobacco: Never  Vaping Use   Vaping Use: Never used  Substance Use Topics   Alcohol use: No   Drug use: No   Family History  Problem Relation Age of Onset   Heart disease Father    Other Brother        open heart surgery   Cancer Brother    Other Brother        open heart surgery   Other Brother  open heart surgery   Hyperlipidemia Daughter    Breast cancer Neg Hx    Kidney cancer Neg Hx    Bladder Cancer Neg Hx    Allergies  Allergen Reactions   Codeine Nausea Only    Sick to her stomach   Erythromycin Nausea And Vomiting and Other (See Comments)   Tramadol Nausea Only and Other (See Comments)    Loss of appetite and did not eat   Ciprofloxacin Diarrhea    Upsets stomach   Current Outpatient Medications on File Prior to Visit  Medication Sig Dispense Refill   ACCU-CHEK GUIDE test strip USE AS INSTRUCTED TO CHECK BLOOD SUGAR ONE TO TWO TIMES A DAY. 100 strip 2   acetaminophen (TYLENOL) 500 MG tablet Take 500 mg by mouth in the morning and at bedtime.     carvedilol (COREG) 3.125 MG tablet Take 1 tablet (3.125 mg total) by mouth daily. 180 tablet 0   clopidogrel (PLAVIX) 75 MG tablet TAKE 1 TABLET BY MOUTH ONCE DAILY. 90 tablet 0   CRANBERRY PO Take 1 tablet by mouth daily.     Cyanocobalamin (VITAMIN B-12 IJ) Inject as directed See admin instructions. Every two months     ezetimibe (ZETIA) 10 MG tablet TAKE 1 TABLET BY MOUTH ONCE DAILY. 90 tablet 0   glipiZIDE (GLUCOTROL XL) 2.5 MG 24 hr tablet TAKE 1 TABLET BY MOUTH ONCE DAILY. 90 tablet 0   lisinopril (ZESTRIL) 10 MG tablet TAKE 1 TABLET BY MOUTH ONCE DAILY. 90 tablet 0   metFORMIN (GLUCOPHAGE-XR) 500 MG 24 hr tablet TAKE 1 TABLET BY MOUTH ONCE DAILY. 90 tablet 0   Multiple Vitamins-Minerals (MULTIVITAMIN WITH MINERALS) tablet Take 1 tablet by mouth daily.     nitroGLYCERIN  (NITROSTAT) 0.4 MG SL tablet PLACE 1 TABLET (0.4 MG TOTAL) UNDER THE TONGUE EVERY 5 (FIVE) MINUTES AS NEEDED FOR CHEST PAIN. 25 tablet 0   OneTouch Delica Lancets 01U MISC USE TO TEST TWICE A DAY 100 each 1   pantoprazole (PROTONIX) 40 MG tablet Take 1 tablet (40 mg total) by mouth daily. 90 tablet 3   Probiotic Product (ALIGN) 4 MG CAPS Take 1 capsule by mouth daily.     rosuvastatin (CRESTOR) 40 MG tablet TAKE 1 TABLET BY MOUTH AT BEDTIME FOR CHOLESTEROL 90 tablet 0   No current facility-administered medications on file prior to visit.     Review of Systems  Constitutional:  Positive for fatigue. Negative for activity change, appetite change and fever.  HENT:  Negative for congestion and sore throat.   Eyes:  Negative for itching and visual disturbance.  Respiratory:  Negative for cough and shortness of breath.   Cardiovascular:  Negative for leg swelling.  Gastrointestinal:  Negative for abdominal distention, abdominal pain, constipation, diarrhea and nausea.  Endocrine: Negative for cold intolerance and polydipsia.  Genitourinary:  Positive for dysuria, frequency and urgency. Negative for difficulty urinating, flank pain, hematuria and vaginal discharge.  Musculoskeletal:  Positive for back pain. Negative for myalgias.  Skin:  Negative for rash.  Allergic/Immunologic: Negative for immunocompromised state.  Neurological:  Negative for dizziness and weakness.  Hematological:  Negative for adenopathy.  Psychiatric/Behavioral:         Frustrated by medical problems       Objective:   Physical Exam Constitutional:      General: She is not in acute distress.    Appearance: She is well-developed.  HENT:     Head: Normocephalic and atraumatic.  Mouth/Throat:     Mouth: Mucous membranes are moist.  Eyes:     Conjunctiva/sclera: Conjunctivae normal.     Pupils: Pupils are equal, round, and reactive to light.  Cardiovascular:     Rate and Rhythm: Normal rate and regular rhythm.      Heart sounds: Normal heart sounds.  Pulmonary:     Effort: Pulmonary effort is normal.     Breath sounds: Normal breath sounds.  Abdominal:     General: Bowel sounds are normal. There is no distension.     Palpations: Abdomen is soft.     Tenderness: There is abdominal tenderness. There is no rebound.     Comments: No cva tenderness (above baseline)   Mild suprapubic tenderness Bladder does not feel distended   Musculoskeletal:     Cervical back: Normal range of motion and neck supple.     Comments: Poor rom of LS and TS  Lymphadenopathy:     Cervical: No cervical adenopathy.  Skin:    Coloration: Skin is not pale.     Findings: No rash.  Neurological:     Mental Status: She is alert. Mental status is at baseline.     Cranial Nerves: No cranial nerve deficit.  Psychiatric:        Mood and Affect: Mood normal.          Assessment & Plan:   Problem List Items Addressed This Visit       Genitourinary   Acute cystitis - Primary    With freq/urgency and dysuria /no hematuria Reassuring exam occ L flank pain (this may be from back however) ua pos  cx pend Keflex 500 mg tid (in light of renal insuff) Will change if cx indicates needed Handout given  Enc her to keep up a good water intake  inst to alert if worse symptoms  Update if not starting to improve in a week or if worsening  ER precautions rev       Relevant Orders   Urine Culture   Renal insufficiency    Last cr up to 1.21  Good fluids  Plan to re check today  May still be elevated due to uti   If so will need to check again after tx finished        Relevant Orders   Basic metabolic panel     Other   Dysuria   Relevant Orders   POCT Urinalysis Dipstick (Automated) (Completed)

## 2022-02-14 NOTE — Assessment & Plan Note (Signed)
With freq/urgency and dysuria /no hematuria Reassuring exam occ L flank pain (this may be from back however) ua pos  cx pend Keflex 500 mg tid (in light of renal insuff) Will change if cx indicates needed Handout given  Enc her to keep up a good water intake  inst to alert if worse symptoms  Update if not starting to improve in a week or if worsening  ER precautions rev

## 2022-02-14 NOTE — Patient Instructions (Addendum)
Take keflex for the uti (with food)  Drink your water   If you suddenly worsen before culture returns please let us know   Otherwise we will notify you about the culture result   Lab today  Kidney labs today We may have to re check those again after your uti treatment

## 2022-02-15 ENCOUNTER — Telehealth: Payer: Self-pay | Admitting: *Deleted

## 2022-02-15 ENCOUNTER — Encounter: Payer: Self-pay | Admitting: Family Medicine

## 2022-02-15 NOTE — Telephone Encounter (Signed)
[name] notified.

## 2022-02-15 NOTE — Telephone Encounter (Signed)
So sorry-I do want her to take tid-please change that  Thank s

## 2022-02-15 NOTE — Telephone Encounter (Signed)
[name] sent a message asking for clarification on abx pt was prescribed. Keflex bottle says to take med BID but #21 caps was given which is usually for TID dosing. They would like to know correct dose pt should take, please advise

## 2022-02-16 LAB — URINE CULTURE
MICRO NUMBER:: 13464774
SPECIMEN QUALITY:: ADEQUATE

## 2022-02-19 ENCOUNTER — Other Ambulatory Visit: Payer: Self-pay | Admitting: Nurse Practitioner

## 2022-02-19 ENCOUNTER — Telehealth: Payer: Self-pay

## 2022-02-19 DIAGNOSIS — E1122 Type 2 diabetes mellitus with diabetic chronic kidney disease: Secondary | ICD-10-CM

## 2022-02-19 NOTE — Telephone Encounter (Signed)
I have attempted without success to contact this patient by phone to discuss lab results and I left a message on answering machine.

## 2022-02-19 NOTE — Telephone Encounter (Signed)
-----   Message from Abner Greenspan, MD sent at 02/18/2022  9:50 AM EDT ----- The keflex should work for your uti  Please let us know if symptoms are not improving

## 2022-02-20 ENCOUNTER — Telehealth: Payer: Self-pay

## 2022-02-20 NOTE — Telephone Encounter (Signed)
-----   Message from Abner Greenspan, MD sent at 02/19/2022  8:35 PM EDT ----- Thanks for letting me know.  A vaginal infection is different from a uti so please follow up if the vaginal discomfort persists  ----- Message ----- From: Mardelle Matte, CMA Sent: 02/19/2022   3:29 PM EDT To: Abner Greenspan, MD  Called and spoke w/ pt she said that she still having some pain in the vaginal area  , no burning w/urination , no fever or chills.pt is still taking the antibiotic

## 2022-02-20 NOTE — Telephone Encounter (Signed)
I left a message for the patient to return my call.

## 2022-02-24 ENCOUNTER — Other Ambulatory Visit: Payer: Self-pay | Admitting: Cardiovascular Disease

## 2022-02-27 ENCOUNTER — Ambulatory Visit: Payer: Medicare HMO | Admitting: Nurse Practitioner

## 2022-03-06 ENCOUNTER — Other Ambulatory Visit: Payer: Self-pay | Admitting: Cardiovascular Disease

## 2022-03-13 NOTE — Patient Instructions (Signed)
Diabetes Mellitus and Foot Care Foot care is an important part of your health, especially when you have diabetes. Diabetes may cause you to have problems because of poor blood flow (circulation) to your feet and legs, which can cause your skin to: Become thinner and drier. Break more easily. Heal more slowly. Peel and crack. You may also have nerve damage (neuropathy) in your legs and feet, causing decreased feeling in them. This means that you may not notice minor injuries to your feet that could lead to more serious problems. Noticing and addressing any potential problems early is the best way to prevent future foot problems. How to care for your feet Foot hygiene  Wash your feet daily with warm water and mild soap. Do not use hot water. Then, pat your feet and the areas between your toes until they are completely dry. Do not soak your feet as this can dry your skin. Trim your toenails straight across. Do not dig under them or around the cuticle. File the edges of your nails with an emery board or nail file. Apply a moisturizing lotion or petroleum jelly to the skin on your feet and to dry, brittle toenails. Use lotion that does not contain alcohol and is unscented. Do not apply lotion between your toes. Shoes and socks Wear clean socks or stockings every day. Make sure they are not too tight. Do not wear knee-high stockings since they may decrease blood flow to your legs. Wear shoes that fit properly and have enough cushioning. Always look in your shoes before you put them on to be sure there are no objects inside. To break in new shoes, wear them for just a few hours a day. This prevents injuries on your feet. Wounds, scrapes, corns, and calluses  Check your feet daily for blisters, cuts, bruises, sores, and redness. If you cannot see the bottom of your feet, use a mirror or ask someone for help. Do not cut corns or calluses or try to remove them with medicine. If you find a minor scrape,  cut, or break in the skin on your feet, keep it and the skin around it clean and dry. You may clean these areas with mild soap and water. Do not clean the area with peroxide, alcohol, or iodine. If you have a wound, scrape, corn, or callus on your foot, look at it several times a day to make sure it is healing and not infected. Check for: Redness, swelling, or pain. Fluid or blood. Warmth. Pus or a bad smell. General tips Do not cross your legs. This may decrease blood flow to your feet. Do not use heating pads or hot water bottles on your feet. They may burn your skin. If you have lost feeling in your feet or legs, you may not know this is happening until it is too late. Protect your feet from hot and cold by wearing shoes, such as at the beach or on hot pavement. Schedule a complete foot exam at least once a year (annually) or more often if you have foot problems. Report any cuts, sores, or bruises to your health care provider immediately. Where to find more information American Diabetes Association: www.diabetes.org [employer] Education [employer]: www.diabeteseducator.org Contact a health care provider if: You have a medical condition that increases your risk of infection and you have any cuts, sores, or bruises on your feet. You have an injury that is not healing. You have redness on your legs or feet. You   feel burning or tingling in your legs or feet. You have pain or cramps in your legs and feet. Your legs or feet are numb. Your feet always feel cold. You have pain around any toenails. Get help right away if: You have a wound, scrape, corn, or callus on your foot and: You have pain, swelling, or redness that gets worse. You have fluid or blood coming from the wound, scrape, corn, or callus. Your wound, scrape, corn, or callus feels warm to the touch. You have pus or a bad smell coming from the wound, scrape, corn, or callus. You have a fever. You have a red  line going up your leg. Summary Check your feet every day for blisters, cuts, bruises, sores, and redness. Apply a moisturizing lotion or petroleum jelly to the skin on your feet and to dry, brittle toenails. Wear shoes that fit properly and have enough cushioning. If you have foot problems, report any cuts, sores, or bruises to your health care provider immediately. Schedule a complete foot exam at least once a year (annually) or more often if you have foot problems. This information is not intended to replace advice given to you by your health care provider. Make sure you discuss any questions you have with your health care provider. Document Revised: 03/24/2020 Document Reviewed: 03/24/2020 [employer] Patient Education  2023 [employer]  

## 2022-03-14 ENCOUNTER — Ambulatory Visit (INDEPENDENT_AMBULATORY_CARE_PROVIDER_SITE_OTHER): Payer: Medicare HMO | Admitting: Nurse Practitioner

## 2022-03-14 ENCOUNTER — Encounter: Payer: Self-pay | Admitting: Nurse Practitioner

## 2022-03-14 VITALS — BP 135/68 | HR 55 | Ht 63.0 in | Wt 129.0 lb

## 2022-03-14 DIAGNOSIS — I10 Essential (primary) hypertension: Secondary | ICD-10-CM

## 2022-03-14 DIAGNOSIS — E782 Mixed hyperlipidemia: Secondary | ICD-10-CM

## 2022-03-14 DIAGNOSIS — E1122 Type 2 diabetes mellitus with diabetic chronic kidney disease: Secondary | ICD-10-CM | POA: Diagnosis not present

## 2022-03-14 DIAGNOSIS — E559 Vitamin D deficiency, unspecified: Secondary | ICD-10-CM

## 2022-03-14 DIAGNOSIS — N182 Chronic kidney disease, stage 2 (mild): Secondary | ICD-10-CM | POA: Diagnosis not present

## 2022-03-14 LAB — POCT GLYCOSYLATED HEMOGLOBIN (HGB A1C): HbA1c POC (<> result, manual entry): 8 % (ref 4.0–5.6)

## 2022-03-14 MED ORDER — METFORMIN HCL ER 500 MG PO TB24: 500.0000 mg | ORAL_TABLET | Freq: Every day | ORAL | 3 refills | Status: DC

## 2022-03-14 MED ORDER — GLIPIZIDE ER 2.5 MG PO TB24: 2.5000 mg | ORAL_TABLET | Freq: Every day | ORAL | 3 refills | Status: DC

## 2022-03-14 NOTE — Progress Notes (Signed)
03/14/2022, 11:56 AM  Endocrinology follow-up note   Subjective:    Patient ID: Tracey [name], female    DOB: [birth year].  Tracey [name] is being seen  in follow-up after she was seen in consultation for management of currently uncontrolled symptomatic diabetes requested by  Abner Greenspan, MD.   Past Medical History:  Diagnosis Date   Anxiety    Arthritis    Atrial fibrillation ([place])    history   Chronic kidney disease    ckd stage II   Coronary artery disease    Diabetes mellitus    non insulin dependent   Dysrhythmia    atrial fibrillation, history of   GERD (gastroesophageal reflux disease)    Heart murmur    History of kidney stones    Hyperlipidemia    Hypertension    Peripheral vascular disease ([place])    Rectocele    Syncope 09/2019   UTI (urinary tract infection)     Past Surgical History:  Procedure Laterality Date   ABDOMINAL AORTA STENT     APPENDECTOMY     BREAST BIOPSY Left    20 years ago   BREAST BIOPSY Right 2008   BREAST EXCISIONAL BIOPSY     rt 2008 lt "20 years ago"   CARDIAC CATHETERIZATION     CATARACT EXTRACTION W/PHACO Left 08/15/2015   Procedure: CATARACT EXTRACTION PHACO AND INTRAOCULAR LENS PLACEMENT ([place]);  Surgeon: Estill Cotta, MD;  Location: [facility] ORS;  Service: Ophthalmology;  Laterality: Left;  [place] 01:28 AP% 26.4 CDE 35.73 fluid pack # [id number]   COLONOSCOPY WITH PROPOFOL N/A 01/16/2016   Procedure: COLONOSCOPY WITH PROPOFOL;  Surgeon: Manya Silvas, MD;  Location: [facility] ENDOSCOPY;  Service: Endoscopy;  Laterality: N/A;   CORONARY ANGIOPLASTY WITH STENT PLACEMENT     CORONARY ARTERY BYPASS GRAFT  1995   with LIMA  to the LAD, SVG to OM1, SVG to PDA   ESOPHAGOGASTRODUODENOSCOPY (EGD) WITH PROPOFOL N/A 01/16/2016   Procedure: ESOPHAGOGASTRODUODENOSCOPY (EGD) WITH PROPOFOL;  Surgeon: Manya Silvas, MD;  Location: [facility] ENDOSCOPY;  Service:  Endoscopy;  Laterality: N/A;   EYE SURGERY     JOINT REPLACEMENT Right    KNEE ARTHROPLASTY Left 01/06/2020   Procedure: COMPUTER ASSISTED TOTAL KNEE ARTHROPLASTY;  Surgeon: Dereck Leep, MD;  Location: [facility] ORS;  Service: Orthopedics;  Laterality: Left;   LOWER EXTREMITY ANGIOGRAPHY N/A 07/08/2019   Procedure: Abdominal Aortagraphy;  Surgeon: Algernon Huxley, MD;  Location: [place] [facility];  Service: Cardiovascular;  Laterality: N/A;   RENAL ANGIOGRAPHY Left 08/26/2019   Procedure: RENAL ANGIOGRAPHY;  Surgeon: Algernon Huxley, MD;  Location: [facility];  Service: Cardiovascular;  Laterality: Left;   RENAL ARTERY STENT     left, By Dr Hulda Humphrey   REPLACEMENT TOTAL KNEE  03/25/2013   right   TONSILLECTOMY      Social History   Socioeconomic History   Marital status: Married    Spouse name: Not on file   Number of children: Not on file   Years of education: Not on file   Highest education level: Not  on file  Occupational History   Occupation: school cafeteria    Comment: retired  Tobacco Use   Smoking status: Never   Smokeless tobacco: Never  Vaping Use   Vaping Use: Never used  Substance and Sexual Activity   Alcohol use: No   Drug use: No   Sexual activity: Never  Other Topics Concern   Not on file  Social History Narrative   Patient has 3 daughters. Remarried June 2021.   Social Determinants of Health   Financial Resource Strain: Low Risk  (08/24/2021)   Overall Financial Resource Strain (CARDIA)    Difficulty of Paying Living Expenses: Not hard at all  Food Insecurity: No Food Insecurity (08/24/2021)   Hunger Vital Sign    Worried About Running Out of Food in the Last Year: Never true    Ran Out of Food in the Last Year: Never true  Transportation Needs: No Transportation Needs (08/24/2021)   PRAPARE - Hydrologist (Medical): No    Lack of Transportation (Non-Medical): No  Physical Activity: Insufficiently Active (08/24/2021)    Exercise Vital Sign    Days of Exercise per Week: 7 days    Minutes of Exercise per Session: 10 min  Stress: No Stress Concern Present (08/24/2021)   [place]    Feeling of Stress : Not at all  Social Connections: [place] (08/24/2021)   Social Connection and Isolation Panel [NHANES]    Frequency of Communication with Friends and Family: More than three times a week    Frequency of Social Gatherings with Friends and Family: More than three times a week    Attends Religious Services: More than 4 times per year    Active Member of [employer] or Organizations: Yes    Attends Music therapist: More than 4 times per year    Marital Status: Married    Family History  Problem Relation Age of Onset   Heart disease Father    Other Brother        open heart surgery   Cancer Brother    Other Brother        open heart surgery   Other Brother        open heart surgery   Hyperlipidemia Daughter    Breast cancer Neg Hx    Kidney cancer Neg Hx    Bladder Cancer Neg Hx     Outpatient Encounter Medications as of 03/14/2022  Medication Sig   ACCU-CHEK GUIDE test strip USE AS INSTRUCTED TO CHECK BLOOD SUGAR ONE TO TWO TIMES A DAY.   acetaminophen (TYLENOL) 500 MG tablet Take 500 mg by mouth in the morning and at bedtime.   carvedilol (COREG) 3.125 MG tablet Take 1 tablet (3.125 mg total) by mouth daily.   clopidogrel (PLAVIX) 75 MG tablet TAKE 1 TABLET BY MOUTH ONCE DAILY.   CRANBERRY PO Take 1 tablet by mouth daily.   Cyanocobalamin (VITAMIN B-12 IJ) Inject as directed See admin instructions. Every two months   ezetimibe (ZETIA) 10 MG tablet TAKE 1 TABLET BY MOUTH ONCE DAILY.   glipiZIDE (GLUCOTROL XL) 2.5 MG 24 hr tablet Take 1 tablet (2.5 mg total) by mouth daily.   lisinopril (ZESTRIL) 10 MG tablet TAKE 1 TABLET BY MOUTH ONCE DAILY.   metFORMIN (GLUCOPHAGE-XR) 500 MG 24 hr tablet Take 1 tablet (500 mg  total) by mouth daily.   Multiple Vitamins-Minerals (MULTIVITAMIN WITH MINERALS) tablet Take 1  tablet by mouth daily.   nitroGLYCERIN (NITROSTAT) 0.4 MG SL tablet PLACE 1 TABLET (0.4 MG TOTAL) UNDER THE TONGUE EVERY 5 (FIVE) MINUTES AS NEEDED FOR CHEST PAIN.   OneTouch Delica Lancets 29N MISC USE TO TEST TWICE A DAY   pantoprazole (PROTONIX) 40 MG tablet Take 1 tablet (40 mg total) by mouth daily.   Probiotic Product (ALIGN) 4 MG CAPS Take 1 capsule by mouth daily.   rosuvastatin (CRESTOR) 40 MG tablet TAKE 1 TABLET BY MOUTH AT BEDTIME FOR CHOLESTEROL   [DISCONTINUED] cephALEXin (KEFLEX) 500 MG capsule Take 1 capsule (500 mg total) by mouth 2 (two) times daily.   [DISCONTINUED] glipiZIDE (GLUCOTROL XL) 2.5 MG 24 hr tablet TAKE 1 TABLET BY MOUTH ONCE DAILY.   [DISCONTINUED] metFORMIN (GLUCOPHAGE-XR) 500 MG 24 hr tablet TAKE 1 TABLET BY MOUTH ONCE DAILY.   No facility-administered encounter medications on file as of 03/14/2022.    ALLERGIES: Allergies  Allergen Reactions   Codeine Nausea Only    Sick to her stomach   Erythromycin Nausea And Vomiting and Other (See Comments)   Tramadol Nausea Only and Other (See Comments)    Loss of appetite and did not eat   Ciprofloxacin Diarrhea    Upsets stomach    VACCINATION STATUS: Immunization History  Administered Date(s) Administered   Fluad Quad(high Dose 65+) 06/15/2019, 09/26/2020   Influenza Split 08/10/2015   Influenza,inj,Quad PF,6+ Mos 05/24/2016, 07/26/2017, 06/03/2018, 07/11/2021   Influenza-Unspecified 08/10/2015, 05/24/2016   Moderna Sars-Covid-2 Vaccination 10/02/2019, 10/30/2019, 08/01/2020   Pneumococcal Conjugate-13 11/05/2016   Pneumococcal Polysaccharide-23 12/11/2017   Td 03/17/2013    Diabetes She presents for her follow-up diabetic visit. She has type 2 diabetes mellitus. Onset time: She was diagnosed at approximate age of 83 years. Her disease course has been improving. There are no hypoglycemic associated symptoms.  Pertinent negatives for hypoglycemia include no confusion, headaches, pallor or seizures. Associated symptoms include blurred vision (especially in the evenings) and fatigue. Pertinent negatives for diabetes include no chest pain, no polydipsia, no polyphagia and no polyuria. There are no hypoglycemic complications. Symptoms are stable. Diabetic complications include heart disease, nephropathy and PVD. Risk factors for coronary artery disease include dyslipidemia, diabetes mellitus, hypertension, sedentary lifestyle and post-menopausal. Current diabetic treatment includes oral agent (dual therapy). She is compliant with treatment all of the time. Her weight is fluctuating minimally. She is following a generally healthy diet. Meal planning includes avoidance of concentrated sweets and ADA exchanges. She rarely participates in exercise. Her home blood glucose trend is fluctuating minimally. Her breakfast blood glucose range is generally 110-130 mg/dl. (She presents today with her meter and logs showing at goal fasting glycemic profile.  Her POCT A1c today is 8%, increasing from last visit of 7.7%.  Analysis of her meter shows 7-day average of 143, 14-day average of 146, 30-day average of 151, and 90-day average of 159.  She does note she has had several steroid injections and a round of oral prednisone since last visit.  She was also recently treated for UTI.) An ACE inhibitor/angiotensin II receptor blocker is being taken. She does not see a podiatrist.Eye exam is current.  Hyperlipidemia This is a chronic problem. The current episode started more than 1 year ago. The problem is controlled. Recent lipid tests were reviewed and are normal. Exacerbating diseases include chronic renal disease and diabetes. Factors aggravating her hyperlipidemia include beta blockers. Pertinent negatives include no chest pain, myalgias or shortness of breath. Current antihyperlipidemic treatment includes statins. The current treatment  provides moderate improvement of lipids. There are no compliance problems.  Risk factors for coronary artery disease include dyslipidemia, diabetes mellitus, hypertension, post-menopausal and a sedentary lifestyle.  Hypertension This is a chronic problem. The current episode started more than 1 year ago. The problem has been gradually improving since onset. The problem is controlled. Associated symptoms include blurred vision (especially in the evenings). Pertinent negatives include no chest pain, headaches, palpitations or shortness of breath. There are no associated agents to hypertension. Risk factors for coronary artery disease include diabetes mellitus, dyslipidemia, sedentary lifestyle and post-menopausal state. Past treatments include beta blockers and ACE inhibitors. The current treatment provides moderate improvement. There are no compliance problems.  Hypertensive end-organ damage includes kidney disease, CAD/MI and PVD. Identifiable causes of hypertension include chronic renal disease.    Review of systems  Constitutional: + Minimally fluctuating body weight,  current Body mass index is 22.85 kg/m. , no fatigue, no subjective hyperthermia, no subjective hypothermia Eyes: no blurry vision, no xerophthalmia ENT: no sore throat, no nodules palpated in throat, no dysphagia/odynophagia, no hoarseness Cardiovascular: no chest pain, no shortness of breath, no palpitations, no leg swelling Respiratory: no cough, no shortness of breath Gastrointestinal: no nausea/vomiting/diarrhea Musculoskeletal: no muscle/joint aches Skin: no rashes, no hyperemia Neurological: no tremors, no numbness, no tingling, no dizziness Psychiatric: no depression, no anxiety   Objective:    BP 135/68   Pulse (!) 55   Ht '5\' 3"'$  (1.6 m)   Wt 129 lb (58.5 kg)   BMI 22.85 kg/m   Wt Readings from Last 3 Encounters:  03/14/22 129 lb (58.5 kg)  02/14/22 126 lb 4 oz (57.3 kg)  12/20/21 133 lb (60.3 kg)    BP  Readings from Last 3 Encounters:  03/14/22 135/68  02/14/22 126/68  12/12/21 118/60     Physical Exam- Limited  Constitutional:  Body mass index is 22.85 kg/m. , not in acute distress, normal state of mind Eyes:  EOMI, no exophthalmos Neck: Supple Cardiovascular: RRR, no murmurs, rubs, or gallops, no edema Respiratory: Adequate breathing efforts, no crackles, rales, rhonchi, or wheezing Musculoskeletal: no gross deformities, strength intact in all four extremities, no gross restriction of joint movements Skin:  no rashes, no hyperemia Neurological: no tremor with outstretched hands    CMP ( most recent) CMP     Component Value Date/Time   NA 137 02/14/2022 1222   NA 137 03/27/2013 0545   K 4.6 02/14/2022 1222   K 3.9 03/27/2013 0545   CL 102 02/14/2022 1222   CL 103 03/27/2013 0545   CO2 29 02/14/2022 1222   CO2 27 03/27/2013 0545   GLUCOSE 291 (H) 02/14/2022 1222   GLUCOSE 122 (H) 03/27/2013 0545   BUN 29 (H) 02/14/2022 1222   BUN 10 03/27/2013 0545   CREATININE 1.10 02/14/2022 1222   CREATININE 0.99 (H) 04/24/2021 1043   CALCIUM 10.3 02/14/2022 1222   CALCIUM 8.9 03/27/2013 0545   PROT 7.1 11/01/2021 1038   PROT 7.6 10/07/2011 1316   ALBUMIN 4.5 11/01/2021 1038   ALBUMIN 4.5 10/07/2011 1316   AST 21 11/01/2021 1038   AST 26 10/07/2011 1316   ALT 13 11/01/2021 1038   ALT 26 10/07/2011 1316   ALKPHOS 52 11/01/2021 1038   ALKPHOS 46 (L) 10/07/2011 1316   BILITOT 0.5 11/01/2021 1038   BILITOT 0.5 10/07/2011 1316   GFRNONAA 54 (L) 03/03/2020 0000   GFRAA 63 03/03/2020 0000     Diabetic Labs (most recent): Lab Results  Component Value Date   HGBA1C 8.0 03/14/2022   HGBA1C 7.7 (A) 10/30/2021   HGBA1C 7.0 (A) 04/26/2021   MICROALBUR 30 04/26/2021   MICROALBUR 1.9 03/03/2020     Lipid Panel ( most recent) Lipid Panel     Component Value Date/Time   CHOL 125 11/01/2021 1038   CHOL 158 12/12/2020 1115   CHOL 141 10/08/2011 0457   TRIG 79.0 11/01/2021  1038   TRIG 179 10/08/2011 0457   HDL 48.50 11/01/2021 1038   HDL 52 12/12/2020 1115   HDL 37 (L) 10/08/2011 0457   CHOLHDL 3 11/01/2021 1038   VLDL 15.8 11/01/2021 1038   VLDL 36 10/08/2011 0457   LDLCALC 60 11/01/2021 1038   LDLCALC 70 04/24/2021 1043   LDLCALC 68 10/08/2011 0457      Lab Results  Component Value Date   TSH 2.44 11/01/2021   TSH 1.94 06/07/2021   TSH 1.99 04/24/2021   TSH 1.66 03/03/2020   TSH 2.68 03/05/2019   TSH 2.96 11/06/2017   TSH 1.24 02/01/2017   TSH 1.86 02/06/2016   FREET4 1.4 04/24/2021   FREET4 1.4 03/03/2020      Assessment & Plan:   1) Type 2 diabetes complicated by stage 2 CKD  - Tracey [name] has currently controlled asymptomatic type 2 DM since  86 years of age.  She presents today with her meter and logs showing at goal fasting glycemic profile.  Her POCT A1c today is 8%, increasing from last visit of 7.7%.  Analysis of her meter shows 7-day average of 143, 14-day average of 146, 30-day average of 151, and 90-day average of 159.  She does note she has had several steroid injections and a round of oral prednisone since last visit.  She was also recently treated for UTI.  - I had a long discussion with her about the progressive nature of diabetes and the pathology behind its complications. -her diabetes is complicated by stage 2 CKD and she remains at a high risk for more acute and chronic complications which include CAD, CVA, CKD, retinopathy, and neuropathy. These are all discussed in detail with her.  The following Lifestyle Medicine recommendations according to [facility]) were discussed and offered to patient and she agrees to start the journey:  A. Whole Foods, Plant-based plate comprising of fruits and vegetables, plant-based proteins, whole-grain carbohydrates was discussed in detail with the patient.   A list for source of those nutrients were also provided to the patient.  Patient will use only  water or unsweetened tea for hydration. B.  The need to stay away from risky substances including alcohol, smoking; obtaining 7 to 9 hours of restorative sleep, at least 150 minutes of moderate intensity exercise weekly, the importance of healthy social connections,  and stress reduction techniques were discussed. C.  A full color page of  Calorie density of various food groups per pound showing examples of each food groups was provided to the patient.  - Nutritional counseling repeated at each appointment due to patients tendency to fall back in to old habits.  - The patient admits there is a room for improvement in their diet and drink choices. -  Suggestion is made for the patient to avoid simple carbohydrates from their diet including Cakes, Sweet Desserts / Pastries, Ice Cream, Soda (diet and regular), Sweet Tea, Candies, Chips, Cookies, Sweet Pastries, Store Bought Juices, Alcohol in Excess of 1-2 drinks a day, Artificial Sweeteners, Coffee Creamer, and "  Sugar-free" Products. This will help patient to have stable blood glucose profile and potentially avoid unintended weight gain.   - I encouraged the patient to switch to unprocessed or minimally processed complex starch and increased protein intake (animal or plant source), fruits, and vegetables.   - Patient is advised to stick to a routine mealtimes to eat 3 meals a day and avoid unnecessary snacks (to snack only to correct hypoglycemia).  - I have approached her with the following individualized plan to manage  her diabetes and patient agrees:   -She is advised to continue Metformin 500 mg XR po daily with breakfast and Glipizide 2.5 mg XL PO daily with breakfast.  She discussed her desire to come off Metformin in the future.  We talked about the potential of using Januvia in the future.  -She is encouraged to continue monitoring blood glucose at least once daily, before breakfast and call the clinic if she gets readings less than 70 or  greater than 200 for 3 tests in a row.  - she is not a candidate for SGLT2 inhibitors or full dose Metformin due to concurrent renal insufficiency.  - she is not a candidate for incretin therapy due to her body habitus.    - Patient specific target  A1c;  LDL, HDL, Triglycerides, and  Waist Circumference were discussed in detail.  2) Blood Pressure /Hypertension:  Her blood pressure is controlled to target.  She is advised to continue Coreg 3.125 mg po twice daily and Lisinopril 10 mg po daily.   3) Lipids/Hyperlipidemia: Her most recent lipid panel from 04/24/21 shows controlled LDL at 70.  She is advised to continue Crestor 40 mg po daily at bedtime.  Side effects and precautions discussed with her.    4)  Weight/Diet: Her Body mass index is 22.85 kg/m.  -She is not a candidate for weight loss.  Exercise, and detailed carbohydrates information provided  -  detailed on discharge instructions.  5) Chronic Care/Health Maintenance: -she is on ACEI/ARB and Statin medications and  is encouraged to initiate and continue to follow up with Ophthalmology, Dentist,  Podiatrist at least yearly or according to recommendations, and advised to   stay away from smoking. I have recommended yearly flu vaccine and pneumonia vaccine at least every 5 years; moderate intensity exercise for up to 150 minutes weekly; and  sleep for at least 7 hours a day.  - she is advised to maintain close follow up with Tower, Wynelle Fanny, MD for primary care needs, as well as her other providers for optimal and coordinated care.    I spent 30 minutes in the care of the patient today including review of labs from [place], Lipids, Thyroid Function, Hematology (current and previous including abstractions from other facilities); face-to-face time discussing  her blood glucose readings/logs, discussing hypoglycemia and hyperglycemia episodes and symptoms, medications doses, her options of short and long term treatment based on the latest  standards of care / guidelines;  discussion about incorporating lifestyle medicine;  and documenting the encounter. Risk reduction counseling performed per USPSTF guidelines to reduce obesity and cardiovascular risk factors.     Please refer to Patient Instructions for Blood Glucose Monitoring and Insulin/Medications Dosing Guide"  in media tab for additional information. Please  also refer to " Patient Self Inventory" in the Media  tab for reviewed elements of pertinent patient history.  Tracey [name] participated in the discussions, expressed understanding, and voiced agreement with the above plans.  All questions  were answered to her satisfaction. she is encouraged to contact clinic should she have any questions or concerns prior to her return visit.    Follow up plan: - Return in about 4 months (around 07/14/2022) for Diabetes F/U with A1c in office, No previsit labs, Bring meter and logs.  Rayetta Pigg, [facility] [address] [place], [place] [address] Phone: [phone] Fax: [phone]  03/14/2022, 11:56 AM

## 2022-03-15 ENCOUNTER — Ambulatory Visit (INDEPENDENT_AMBULATORY_CARE_PROVIDER_SITE_OTHER): Payer: Medicare HMO | Admitting: Family

## 2022-03-15 ENCOUNTER — Telehealth: Payer: Self-pay

## 2022-03-15 VITALS — BP 140/70 | HR 53 | Temp 97.3°F | Wt 130.3 lb

## 2022-03-15 DIAGNOSIS — R82998 Other abnormal findings in urine: Secondary | ICD-10-CM | POA: Diagnosis not present

## 2022-03-15 DIAGNOSIS — N3000 Acute cystitis without hematuria: Secondary | ICD-10-CM | POA: Insufficient documentation

## 2022-03-15 DIAGNOSIS — R300 Dysuria: Secondary | ICD-10-CM | POA: Diagnosis not present

## 2022-03-15 LAB — POC URINALSYSI DIPSTICK (AUTOMATED)
Bilirubin, UA: NEGATIVE
Blood, UA: NEGATIVE
Glucose, UA: NEGATIVE
Ketones, UA: NEGATIVE
Nitrite, UA: NEGATIVE
Protein, UA: NEGATIVE
Spec Grav, UA: 1.015 (ref 1.010–1.025)
Urobilinogen, UA: 0.2 E.U./dL
pH, UA: 6 (ref 5.0–8.0)

## 2022-03-15 MED ORDER — AMOXICILLIN-POT CLAVULANATE 875-125 MG PO TABS: 1.0000 | ORAL_TABLET | Freq: Two times a day (BID) | ORAL | 0 refills | Status: DC

## 2022-03-15 NOTE — Telephone Encounter (Signed)
Patient is calling in stating that she believes she has another UTI, and wanted to know if she can bring by a urine sample.

## 2022-03-15 NOTE — Assessment & Plan Note (Signed)
Treating for uti

## 2022-03-15 NOTE — Telephone Encounter (Signed)
Spoke with advised her that dr tower will be out of the office this afternoon  and up coming dates , she will need to come in to see different provider.  We couldn't do a ua drop for this reason.  Made an appt for  he to come in this afternoon 220pm .

## 2022-03-15 NOTE — Assessment & Plan Note (Signed)
poct urine dip in office Urine culture ordered pending results  

## 2022-03-15 NOTE — Progress Notes (Signed)
Established Patient Office Visit  Subjective:  Patient ID: Tracey [name], female    DOB: Nov 27, 1935  Age: 86 y.o. MRN: [id number]  CC:  Chief Complaint  Patient presents with   Dysuria    X 4 days    Urinary Frequency    HPI Tracey [name] is here today with concerns.   For the last four days with dysuria and urinary frequency. Also with urinary urgency.  No fever or chills.  No back pain.  Mild lower pelvic tenderness.   No vaginal discharge.   Past Medical History:  Diagnosis Date   Anxiety    Arthritis    Atrial fibrillation ([place])    history   Chronic kidney disease    ckd stage II   Coronary artery disease    Diabetes mellitus    non insulin dependent   Dysrhythmia    atrial fibrillation, history of   GERD (gastroesophageal reflux disease)    Heart murmur    History of kidney stones    Hyperlipidemia    Hypertension    Peripheral vascular disease ([place])    Rectocele    Syncope 09/2019   UTI (urinary tract infection)     Past Surgical History:  Procedure Laterality Date   ABDOMINAL AORTA STENT     APPENDECTOMY     BREAST BIOPSY Left    20 years ago   BREAST BIOPSY Right 2008   BREAST EXCISIONAL BIOPSY     rt 2008 lt "20 years ago"   CARDIAC CATHETERIZATION     CATARACT EXTRACTION W/PHACO Left 08/15/2015   Procedure: CATARACT EXTRACTION PHACO AND INTRAOCULAR LENS PLACEMENT ([place]);  Surgeon: Estill Cotta, MD;  Location: [facility] ORS;  Service: Ophthalmology;  Laterality: Left;  [place] 01:28 AP% 26.4 CDE 35.73 fluid pack # [id number]   COLONOSCOPY WITH PROPOFOL N/A 01/16/2016   Procedure: COLONOSCOPY WITH PROPOFOL;  Surgeon: Manya Silvas, MD;  Location[facility] ENDOSCOPY;  Service: Endoscopy;  Laterality: N/A;   CORONARY ANGIOPLASTY WITH STENT PLACEMENT     CORONARY ARTERY BYPASS GRAFT  1995   with LIMA  to the LAD, SVG to OM1, SVG to PDA   ESOPHAGOGASTRODUODENOSCOPY (EGD) WITH PROPOFOL N/A 01/16/2016   Procedure: ESOPHAGOGASTRODUODENOSCOPY (EGD) WITH  PROPOFOL;  Surgeon: Manya Silvas, MD;  Location: [facility] ENDOSCOPY;  Service: Endoscopy;  Laterality: N/A;   EYE SURGERY     JOINT REPLACEMENT Right    KNEE ARTHROPLASTY Left 01/06/2020   Procedure: COMPUTER ASSISTED TOTAL KNEE ARTHROPLASTY;  Surgeon: Dereck Leep, MD;  Location: [facility] ORS;  Service: Orthopedics;  Laterality: Left;   LOWER EXTREMITY ANGIOGRAPHY N/A 07/08/2019   Procedure: Abdominal Aortagraphy;  Surgeon: Algernon Huxley, MD;  Location: [place] CV LAB;  Service: Cardiovascular;  Laterality: N/A;   RENAL ANGIOGRAPHY Left 08/26/2019   Procedure: RENAL ANGIOGRAPHY;  Surgeon: Algernon Huxley, MD;  Location: [place] CV LAB;  Service: Cardiovascular;  Laterality: Left;   RENAL ARTERY STENT     left, By Dr Hulda Humphrey   REPLACEMENT TOTAL KNEE  03/25/2013   right   TONSILLECTOMY      Family History  Problem Relation Age of Onset   Heart disease Father    Other Brother        open heart surgery   Cancer Brother    Other Brother        open heart surgery   Other Brother        open heart surgery   Hyperlipidemia Daughter  Breast cancer Neg Hx    Kidney cancer Neg Hx    Bladder Cancer Neg Hx     Social History   Socioeconomic History   Marital status: Married    Spouse name: Not on file   Number of children: Not on file   Years of education: Not on file   Highest education level: Not on file  Occupational History   Occupation: school cafeteria    Comment: retired  Tobacco Use   Smoking status: Never   Smokeless tobacco: Never  Vaping Use   Vaping Use: Never used  Substance and Sexual Activity   Alcohol use: No   Drug use: No   Sexual activity: Never  Other Topics Concern   Not on file  Social History Narrative   Patient has 3 daughters. Remarried June 2021.   Social Determinants of Health   Financial Resource Strain: Low Risk  (08/24/2021)   Overall Financial Resource Strain (CARDIA)    Difficulty of Paying Living Expenses: Not hard at all  Food  Insecurity: No Food Insecurity (08/24/2021)   Hunger Vital Sign    Worried About Running Out of Food in the Last Year: Never true    Ran Out of Food in the Last Year: Never true  Transportation Needs: No Transportation Needs (08/24/2021)   PRAPARE - Hydrologist (Medical): No    Lack of Transportation (Non-Medical): No  Physical Activity: Insufficiently Active (08/24/2021)   Exercise Vital Sign    Days of Exercise per Week: 7 days    Minutes of Exercise per Session: 10 min  Stress: No Stress Concern Present (08/24/2021)   [place]    Feeling of Stress : Not at all  Social Connections: [place] (08/24/2021)   Social Connection and Isolation Panel [NHANES]    Frequency of Communication with Friends and Family: More than three times a week    Frequency of Social Gatherings with Friends and Family: More than three times a week    Attends Religious Services: More than 4 times per year    Active Member of [employer] or Organizations: Yes    Attends Music therapist: More than 4 times per year    Marital Status: Married  Human resources officer Violence: Not At Risk (08/24/2021)   Humiliation, Afraid, Rape, and Kick questionnaire    Fear of Current or Ex-Partner: No    Emotionally Abused: No    Physically Abused: No    Sexually Abused: No    Outpatient Medications Prior to Visit  Medication Sig Dispense Refill   ACCU-CHEK GUIDE test strip USE AS INSTRUCTED TO CHECK BLOOD SUGAR ONE TO TWO TIMES A DAY. 100 strip 2   acetaminophen (TYLENOL) 500 MG tablet Take 500 mg by mouth in the morning and at bedtime.     carvedilol (COREG) 3.125 MG tablet Take 1 tablet (3.125 mg total) by mouth daily. 180 tablet 0   clopidogrel (PLAVIX) 75 MG tablet TAKE 1 TABLET BY MOUTH ONCE DAILY. 90 tablet 0   CRANBERRY PO Take 1 tablet by mouth daily.     Cyanocobalamin (VITAMIN B-12 IJ) Inject as directed See  admin instructions. Every two months     ezetimibe (ZETIA) 10 MG tablet TAKE 1 TABLET BY MOUTH ONCE DAILY. 90 tablet 0   glipiZIDE (GLUCOTROL XL) 2.5 MG 24 hr tablet Take 1 tablet (2.5 mg total) by mouth daily. 90 tablet 3   lisinopril (ZESTRIL)  10 MG tablet TAKE 1 TABLET BY MOUTH ONCE DAILY. 90 tablet 0   metFORMIN (GLUCOPHAGE-XR) 500 MG 24 hr tablet Take 1 tablet (500 mg total) by mouth daily. 90 tablet 3   Multiple Vitamins-Minerals (MULTIVITAMIN WITH MINERALS) tablet Take 1 tablet by mouth daily.     nitroGLYCERIN (NITROSTAT) 0.4 MG SL tablet PLACE 1 TABLET (0.4 MG TOTAL) UNDER THE TONGUE EVERY 5 (FIVE) MINUTES AS NEEDED FOR CHEST PAIN. 25 tablet 0   OneTouch Delica Lancets 50K MISC USE TO TEST TWICE A DAY 100 each 1   pantoprazole (PROTONIX) 40 MG tablet Take 1 tablet (40 mg total) by mouth daily. 90 tablet 3   Probiotic Product (ALIGN) 4 MG CAPS Take 1 capsule by mouth daily.     rosuvastatin (CRESTOR) 40 MG tablet TAKE 1 TABLET BY MOUTH AT BEDTIME FOR CHOLESTEROL 90 tablet 0   No facility-administered medications prior to visit.    Allergies  Allergen Reactions   Codeine Nausea Only    Sick to her stomach   Erythromycin Nausea And Vomiting and Other (See Comments)   Tramadol Nausea Only and Other (See Comments)    Loss of appetite and did not eat   Ciprofloxacin Diarrhea    Upsets stomach        Objective:    Physical Exam Constitutional:      General: She is not in acute distress.    Appearance: Normal appearance. She is well-developed, well-groomed and normal weight. She is not ill-appearing, toxic-appearing or diaphoretic.  HENT:     Mouth/Throat:     Pharynx: No pharyngeal swelling.     Tonsils: No tonsillar exudate.  Neck:     Thyroid: No thyroid mass.  Pulmonary:     Effort: Pulmonary effort is normal.  Abdominal:     Tenderness: There is no abdominal tenderness.  Musculoskeletal:     Lumbar back: Normal. No tenderness.     Comments: No flank pain no CVA  tenderness  Lymphadenopathy:     Cervical:     Right cervical: No superficial cervical adenopathy.    Left cervical: No superficial cervical adenopathy.  Neurological:     General: No focal deficit present.     Mental Status: She is alert and oriented to person, place, and time. Mental status is at baseline.  Psychiatric:        Mood and Affect: Mood normal.        Behavior: Behavior normal.        Thought Content: Thought content normal.        Judgment: Judgment normal.     BP 140/70   Pulse (!) 53   Temp (!) 97.3 F (36.3 C) (Temporal)   Wt 130 lb 5 oz (59.1 kg)   SpO2 98%   BMI 23.08 kg/m  Wt Readings from Last 3 Encounters:  03/15/22 130 lb 5 oz (59.1 kg)  03/14/22 129 lb (58.5 kg)  02/14/22 126 lb 4 oz (57.3 kg)     Health Maintenance Due  Topic Date Due   Zoster Vaccines- Shingrix (1 of 2) Never done   COVID-19 Vaccine (4 - Moderna series) 09/26/2020    There are no preventive care reminders to display for this patient.  Lab Results  Component Value Date   TSH 2.44 11/01/2021   Lab Results  Component Value Date   WBC 4.9 11/01/2021   HGB 11.7 (L) 11/01/2021   HCT 34.7 (L) 11/01/2021   MCV 88.3 11/01/2021   PLT 156.0  11/01/2021   Lab Results  Component Value Date   NA 137 02/14/2022   K 4.6 02/14/2022   CO2 29 02/14/2022   GLUCOSE 291 (H) 02/14/2022   BUN 29 (H) 02/14/2022   CREATININE 1.10 02/14/2022   BILITOT 0.5 11/01/2021   ALKPHOS 52 11/01/2021   AST 21 11/01/2021   ALT 13 11/01/2021   PROT 7.1 11/01/2021   ALBUMIN 4.5 11/01/2021   CALCIUM 10.3 02/14/2022   ANIONGAP 7 12/29/2019   GFR 46.47 (L) 02/14/2022   Lab Results  Component Value Date   HGBA1C 8.0 03/14/2022      Assessment & Plan:   Problem List Items Addressed This Visit       Genitourinary   Acute cystitis without hematuria    rx augmentin 875/125 mg po bid x 10 days antbx sent to pharmacy, pt to take as directed. Encouraged increased water intake throughout the  day. Urine culture/reflex pending results. Choosing to treat due to being symptomatic. If no improvement in the next 2 days pt advised to let me know.         Other   Dysuria - Primary    poct urine dip in office Urine culture ordered pending results       Relevant Medications   amoxicillin-clavulanate (AUGMENTIN) 875-125 MG tablet   Other Relevant Orders   POCT Urinalysis Dipstick (Automated) (Completed)   Urine Culture   Leukocytes in urine    Treating for uti       Relevant Medications   amoxicillin-clavulanate (AUGMENTIN) 875-125 MG tablet    Meds ordered this encounter  Medications   amoxicillin-clavulanate (AUGMENTIN) 875-125 MG tablet    Sig: Take 1 tablet by mouth 2 (two) times daily.    Dispense:  20 tablet    Refill:  0    Order Specific Question:   Supervising Provider    Answer:   BEDSOLE, AMY E [[id number]]    Follow-up: No follow-ups on file.    Eugenia Pancoast, FNP

## 2022-03-15 NOTE — Assessment & Plan Note (Signed)
rx augmentin 875/125 mg po bid x 10 days antbx sent to pharmacy, pt to take as directed. Encouraged increased water intake throughout the day. Urine culture/reflex pending results. Choosing to treat due to being symptomatic. If no improvement in the next 2 days pt advised to let me know.

## 2022-03-17 LAB — URINE CULTURE
MICRO NUMBER:: 13589082
SPECIMEN QUALITY:: ADEQUATE

## 2022-03-22 DIAGNOSIS — M47816 Spondylosis without myelopathy or radiculopathy, lumbar region: Secondary | ICD-10-CM | POA: Diagnosis not present

## 2022-04-13 ENCOUNTER — Other Ambulatory Visit: Payer: Self-pay | Admitting: Cardiovascular Disease

## 2022-04-16 DIAGNOSIS — N3001 Acute cystitis with hematuria: Secondary | ICD-10-CM | POA: Diagnosis not present

## 2022-05-08 DIAGNOSIS — E119 Type 2 diabetes mellitus without complications: Secondary | ICD-10-CM | POA: Diagnosis not present

## 2022-05-08 LAB — HM DIABETES EYE EXAM

## 2022-05-16 ENCOUNTER — Telehealth: Payer: Self-pay | Admitting: Cardiovascular Disease

## 2022-05-16 MED ORDER — CARVEDILOL 3.125 MG PO TABS: 3.1250 mg | ORAL_TABLET | Freq: Every day | ORAL | 0 refills | Status: DC

## 2022-05-16 NOTE — Telephone Encounter (Signed)
*  STAT* If patient is at the pharmacy, call can be transferred to refill team.   1. Which medications need to be refilled? (please list name of each medication and dose if known) carvedilol (COREG) 3.125 MG tablet  2. Which pharmacy/location (including street and city if local pharmacy) is medication to be sent to? [place], [place]  3. Do they need a 30 day or 90 day supply? 90 day   Patient is completely out of medication. She has not had any today. She has an appointment 9/29

## 2022-05-24 ENCOUNTER — Telehealth: Payer: Self-pay | Admitting: Cardiovascular Disease

## 2022-05-24 MED ORDER — LISINOPRIL 10 MG PO TABS: 10.0000 mg | ORAL_TABLET | Freq: Every day | ORAL | 0 refills | Status: DC

## 2022-05-24 NOTE — Telephone Encounter (Signed)
*  STAT* If patient is at the pharmacy, call can be transferred to refill team.   1. Which medications need to be refilled? (please list name of each medication and dose if known) lisinopril (ZESTRIL) 10 MG tablet  2. Which pharmacy/location (including street and city if local pharmacy) is medication to be sent to? [place], [place]  3. Do they need a 30 day or 90 day supply? 90 day supply

## 2022-05-29 ENCOUNTER — Other Ambulatory Visit: Payer: Self-pay | Admitting: *Deleted

## 2022-05-29 ENCOUNTER — Encounter: Payer: Self-pay | Admitting: Cardiovascular Disease

## 2022-05-29 ENCOUNTER — Other Ambulatory Visit: Payer: Self-pay | Admitting: Cardiovascular Disease

## 2022-05-29 MED ORDER — ROSUVASTATIN CALCIUM 40 MG PO TABS: 40.0000 mg | ORAL_TABLET | Freq: Every day | ORAL | 2 refills | Status: DC

## 2022-06-07 ENCOUNTER — Other Ambulatory Visit: Payer: Self-pay | Admitting: *Deleted

## 2022-06-07 MED ORDER — CLOPIDOGREL BISULFATE 75 MG PO TABS: 75.0000 mg | ORAL_TABLET | Freq: Every day | ORAL | 0 refills | Status: DC

## 2022-06-14 NOTE — Progress Notes (Signed)
Date:  06/15/2022   ID:  Tracey [name], DOB 1935-11-02, MRN [id number]  Patient Location:  [place] [address]   Provider location:   [facility], [place] office  PCP:  Abner Greenspan, MD  Cardiologist:  Patsy Baltimore  Chief Complaint  Patient presents with   6 month follow up     "Doing well." Medications reviewed by the patient verbally.     History of Present Illness:    Tracey [name] is a 86 y.o. female  past medical history of coronary artery disease, bypass surgery in 1995,  diabetes  stent to her graft to the OM several years ago with chest pain January 2013  repeat stenting to the graft to the OM with a Xience 3.5 x 18 mm stent  delivery of a shock for  resuscitation during the procedure),  left renal PCI in 1998, redo PCI March 2010 followed by [name],  History of near syncope spells who presents for routine followup of her coronary artery disease   She  lost her husband in 2016  Last seen by myself in clinic March 2023 In general reports that she has been doing well Regular exercise program, limited by chronic back pain, fatigue, shortness of breath on exertion  Rare fast rhythms, 1-2 times a week  Can't walk up steps, has to stop secondary to shortness of breath and fatigue Can go further with shopping cart, but not very far Asymptomatic bradycardia We have previously recommended she take carvedilol once a day, she reports she is taking this twice a day  Lab work reviewed A1C 8.0 higher after cortisone shot Chronic back pain, sciatica pain  Echo in 2020: normal EF  EKG personally reviewed by myself on todays visit sinus bradycardia rate 53 bpm low voltage  Other past medical history reviewed Prior history of near syncope/syncope episodes Several falls 1 episode reports she was in her Backyard, had a "quick dizzy spell", fell face down  Another episode, 6 Am, went to the bathroom, was walking back to bed,  had a "flash spell", fell, broke hand  Monday, "flash" spell, felt very dizzy Denies vertigo  Followed by [name] 08/25/2020, vascular procedure reviewed and discussed with her in detail Atherosclerotic occlusive disease bilateral lower extremities with rest pain and lifestyle limiting claudication; recurrent left renal artery stenosis; renovascular hypertension  Percutaneous transluminal angioplasty and stent placement pararenal aorta   Percutaneous transluminal angioplasty and stent placement left renal artery  Carotid Carotid artery ultrasound showed 1-39% RICA stenosis and 20-25% LICA stenosis.   Echo: Normal EF 65% RVSP 32 Mild to moderate MR   Past Medical History:  Diagnosis Date   Anxiety    Arthritis    Atrial fibrillation (HCC)    history   Chronic kidney disease    ckd stage II   Coronary artery disease    Diabetes mellitus    non insulin dependent   Dysrhythmia    atrial fibrillation, history of   GERD (gastroesophageal reflux disease)    Heart murmur    History of kidney stones    Hyperlipidemia    Hypertension    Peripheral vascular disease ([place])    Rectocele    Syncope 09/2019   UTI (urinary tract infection)    Past Surgical History:  Procedure Laterality Date   ABDOMINAL AORTA STENT     APPENDECTOMY     BREAST BIOPSY Left    20 years ago  BREAST BIOPSY Right 2008   BREAST EXCISIONAL BIOPSY     rt 2008 lt "20 years ago"   CARDIAC CATHETERIZATION     CATARACT EXTRACTION W/PHACO Left 08/15/2015   Procedure: CATARACT EXTRACTION PHACO AND INTRAOCULAR LENS PLACEMENT ([place]);  Surgeon: Estill Cotta, MD;  Location: [facility] ORS;  Service: Ophthalmology;  Laterality: Left;  [place] 01:28 AP% 26.4 CDE 35.73 fluid pack # [id number]   COLONOSCOPY WITH PROPOFOL N/A 01/16/2016   Procedure: COLONOSCOPY WITH PROPOFOL;  Surgeon: Manya Silvas, MD;  Location: [facility] ENDOSCOPY;  Service: Endoscopy;  Laterality: N/A;   CORONARY ANGIOPLASTY WITH STENT PLACEMENT      CORONARY ARTERY BYPASS GRAFT  1995   with LIMA  to the LAD, SVG to OM1, SVG to PDA   ESOPHAGOGASTRODUODENOSCOPY (EGD) WITH PROPOFOL N/A 01/16/2016   Procedure: ESOPHAGOGASTRODUODENOSCOPY (EGD) WITH PROPOFOL;  Surgeon: Manya Silvas, MD;  Location: [facility] ENDOSCOPY;  Service: Endoscopy;  Laterality: N/A;   EYE SURGERY     JOINT REPLACEMENT Right    KNEE ARTHROPLASTY Left 01/06/2020   Procedure: COMPUTER ASSISTED TOTAL KNEE ARTHROPLASTY;  Surgeon: Dereck Leep, MD;  Location: [facility] ORS;  Service: Orthopedics;  Laterality: Left;   LOWER EXTREMITY ANGIOGRAPHY N/A 07/08/2019   Procedure: Abdominal Aortagraphy;  Surgeon: Algernon Huxley, MD;  Location: [place] CV LAB;  Service: Cardiovascular;  Laterality: N/A;   RENAL ANGIOGRAPHY Left 08/26/2019   Procedure: RENAL ANGIOGRAPHY;  Surgeon: Algernon Huxley, MD;  Location: [place] CV LAB;  Service: Cardiovascular;  Laterality: Left;   RENAL ARTERY STENT     left, By Dr Hulda Humphrey   REPLACEMENT TOTAL KNEE  03/25/2013   right   TONSILLECTOMY       Current Meds  Medication Sig   ACCU-CHEK GUIDE test strip USE AS INSTRUCTED TO CHECK BLOOD SUGAR ONE TO TWO TIMES A DAY.   acetaminophen (TYLENOL) 500 MG tablet Take 500 mg by mouth in the morning and at bedtime.   CRANBERRY PO Take 1 tablet by mouth daily.   Cyanocobalamin (VITAMIN B-12 IJ) Inject as directed See admin instructions. Every two months   glipiZIDE (GLUCOTROL XL) 2.5 MG 24 hr tablet Take 1 tablet (2.5 mg total) by mouth daily.   metFORMIN (GLUCOPHAGE-XR) 500 MG 24 hr tablet Take 1 tablet (500 mg total) by mouth daily.   Multiple Vitamins-Minerals (MULTIVITAMIN WITH MINERALS) tablet Take 1 tablet by mouth daily.   OneTouch Delica Lancets 35H MISC USE TO TEST TWICE A DAY   pantoprazole (PROTONIX) 40 MG tablet Take 1 tablet (40 mg total) by mouth daily.   Probiotic Product (ALIGN) 4 MG CAPS Take 1 capsule by mouth daily.   [DISCONTINUED] carvedilol (COREG) 3.125 MG tablet Take 1 tablet (3.125 mg  total) by mouth daily.   [DISCONTINUED] clopidogrel (PLAVIX) 75 MG tablet Take 1 tablet (75 mg total) by mouth daily.   [DISCONTINUED] ezetimibe (ZETIA) 10 MG tablet TAKE 1 TABLET BY MOUTH ONCE DAILY.   [DISCONTINUED] lisinopril (ZESTRIL) 10 MG tablet Take 1 tablet (10 mg total) by mouth daily.   [DISCONTINUED] nitroGLYCERIN (NITROSTAT) 0.4 MG SL tablet PLACE 1 TABLET (0.4 MG TOTAL) UNDER THE TONGUE EVERY 5 (FIVE) MINUTES AS NEEDED FOR CHEST PAIN.   [DISCONTINUED] rosuvastatin (CRESTOR) 40 MG tablet Take 1 tablet (40 mg total) by mouth daily.     Allergies:   Codeine, Erythromycin, Tramadol, and Ciprofloxacin   Family Hx: The patient's family history includes Cancer in her brother; Heart disease in her father; Hyperlipidemia in her daughter;  Other in her brother, brother, and brother. There is no history of Breast cancer, Kidney cancer, or Bladder Cancer.  ROS:   Please see the history of present illness.    Review of Systems  Constitutional: Negative.   HENT: Negative.    Respiratory: Negative.    Cardiovascular: Negative.   Gastrointestinal: Negative.   Musculoskeletal: Negative.   Neurological: Negative.   Psychiatric/Behavioral: Negative.    All other systems reviewed and are negative.    Labs/Other Tests and Data Reviewed:    Recent Labs: 11/01/2021: ALT 13; Hemoglobin 11.7; Platelets 156.0; TSH 2.44 02/14/2022: BUN 29; Creatinine, Ser 1.10; Potassium 4.6; Sodium 137   Recent Lipid Panel Lab Results  Component Value Date/Time   CHOL 125 11/01/2021 10:38 AM   CHOL 158 12/12/2020 11:15 AM   CHOL 141 10/08/2011 04:57 AM   TRIG 79.0 11/01/2021 10:38 AM   TRIG 179 10/08/2011 04:57 AM   HDL 48.50 11/01/2021 10:38 AM   HDL 52 12/12/2020 11:15 AM   HDL 37 (L) 10/08/2011 04:57 AM   CHOLHDL 3 11/01/2021 10:38 AM   LDLCALC 60 11/01/2021 10:38 AM   LDLCALC 70 04/24/2021 10:43 AM   LDLCALC 68 10/08/2011 04:57 AM    Wt Readings from Last 3 Encounters:  06/15/22 130 lb 8 oz  (59.2 kg)  03/15/22 130 lb 5 oz (59.1 kg)  03/14/22 129 lb (58.5 kg)     Exam:    BP (!) 130/58 (BP Location: Left Arm, Patient Position: Sitting, Cuff Size: Normal)   Pulse (!) 53   Ht '5\' 3"'$  (1.6 m)   Wt 130 lb 8 oz (59.2 kg)   SpO2 99%   BMI 23.12 kg/m  Constitutional:  oriented to person, place, and time. No distress.  HENT:  Head: Grossly normal Eyes:  no discharge. No scleral icterus.  Neck: No JVD, no carotid bruits  Cardiovascular: Regular rate and rhythm, no murmurs appreciated Pulmonary/Chest: Clear to auscultation bilaterally, no wheezes or rails Abdominal: Soft.  no distension.  no tenderness.  Musculoskeletal: Normal range of motion Neurological:  normal muscle tone. Coordination normal. No atrophy Skin: Skin warm and dry Psychiatric: normal affect, pleasant  ASSESSMENT & PLAN:    Supraventricular tachycardia Rare short episodes tachycardia Given bradycardia, Will decrease coreg  beta-blocker  to one a day  For worsening tachycardia may need to go back to beta-blocker twice daily  Sinus bradycardia Relatively asymptomatic though does have fatigue/shortness of breath on exertion Recommend she decrease her Coreg down to 3.25 mg daily  Near syncope, syncope, unspecified syncope type Prior history of near syncope, etiology unclear, ZIO monitor performed previously Triggered events not associated with significant arrhythmia Recommend she stay hydrated, decrease carvedilol as above  PAD (peripheral artery disease) (HCC) Significant aortic disease, renal artery disease Prior intervention by [name],  Continue aspirin Plavix, cholesterol at goal  Hyperlipidemia Cholesterol at goal Continue Crestor Zetia  Diabetes type 2 with complications Numbers trending higher, due for recheck, suspect will run high for some time in the setting of prednisone for her back   Total encounter time more than 30 minutes  Greater than 50% was spent in counseling and coordination  of care with the patient   Signed, Ida Rogue, MD  06/15/2022 11:38 AM    [place] [address], [place], [place] [address]

## 2022-06-15 ENCOUNTER — Encounter: Payer: Self-pay | Admitting: Cardiovascular Disease

## 2022-06-15 ENCOUNTER — Ambulatory Visit: Payer: Medicare HMO | Attending: Cardiovascular Disease | Admitting: Cardiovascular Disease

## 2022-06-15 VITALS — BP 130/58 | HR 53 | Ht 63.0 in | Wt 130.5 lb

## 2022-06-15 DIAGNOSIS — I700 Atherosclerosis of aorta: Secondary | ICD-10-CM

## 2022-06-15 DIAGNOSIS — I701 Atherosclerosis of renal artery: Secondary | ICD-10-CM

## 2022-06-15 DIAGNOSIS — I471 Supraventricular tachycardia: Secondary | ICD-10-CM

## 2022-06-15 DIAGNOSIS — I739 Peripheral vascular disease, unspecified: Secondary | ICD-10-CM

## 2022-06-15 DIAGNOSIS — I10 Essential (primary) hypertension: Secondary | ICD-10-CM

## 2022-06-15 DIAGNOSIS — R002 Palpitations: Secondary | ICD-10-CM | POA: Diagnosis not present

## 2022-06-15 DIAGNOSIS — I25118 Atherosclerotic heart disease of native coronary artery with other forms of angina pectoris: Secondary | ICD-10-CM

## 2022-06-15 DIAGNOSIS — I6522 Occlusion and stenosis of left carotid artery: Secondary | ICD-10-CM | POA: Diagnosis not present

## 2022-06-15 DIAGNOSIS — E0800 Diabetes mellitus due to underlying condition with hyperosmolarity without nonketotic hyperglycemic-hyperosmolar coma (NKHHC): Secondary | ICD-10-CM

## 2022-06-15 DIAGNOSIS — I6523 Occlusion and stenosis of bilateral carotid arteries: Secondary | ICD-10-CM | POA: Diagnosis not present

## 2022-06-15 MED ORDER — CLOPIDOGREL BISULFATE 75 MG PO TABS: 75.0000 mg | ORAL_TABLET | Freq: Every day | ORAL | 3 refills | Status: DC

## 2022-06-15 MED ORDER — CARVEDILOL 3.125 MG PO TABS: 3.1250 mg | ORAL_TABLET | Freq: Every day | ORAL | 3 refills | Status: DC

## 2022-06-15 MED ORDER — NITROGLYCERIN 0.4 MG SL SUBL
0.4000 mg | SUBLINGUAL_TABLET | SUBLINGUAL | 3 refills | Status: AC | PRN
Start: 2022-06-15 — End: 2037-08-22

## 2022-06-15 MED ORDER — ROSUVASTATIN CALCIUM 40 MG PO TABS: 40.0000 mg | ORAL_TABLET | Freq: Every day | ORAL | 3 refills | Status: DC

## 2022-06-15 MED ORDER — LISINOPRIL 10 MG PO TABS: 10.0000 mg | ORAL_TABLET | Freq: Every day | ORAL | 3 refills | Status: DC

## 2022-06-15 MED ORDER — EZETIMIBE 10 MG PO TABS: 10.0000 mg | ORAL_TABLET | Freq: Every day | ORAL | 3 refills | Status: DC

## 2022-06-15 NOTE — Patient Instructions (Addendum)
Medication Instructions:  Coreg to one a day (not twice a day) Heart rate is slow  If you need a refill on your cardiac medications before your next appointment, please call your pharmacy.   Lab work: Your physician has requested that you have a carotid duplex. This test is an ultrasound of the carotid arteries in your neck. It looks at blood flow through these arteries that supply the brain with blood. Allow one hour for this exam. There are no restrictions or special instructions.   Testing/Procedures: No new testing needed  Follow-Up: At [facility], you and your health needs are our priority.  As part of our continuing mission to provide you with exceptional heart care, we have created designated Provider Care Teams.  These Care Teams include your primary Cardiologist (physician) and Advanced Practice Providers (APPs -  Physician Assistants and Nurse Practitioners) who all work together to provide you with the care you need, when you need it.  You will need a follow up appointment in 6 months, APP ok  Providers on your designated Care Team:   Murray Hodgkins, NP Christell Faith, PA-C Cadence Kathlen Mody, [place]  COVID-19 Vaccine Information can be found at: [email or web address] For questions related to vaccine distribution or appointments, please email [email or web address] or call [phone].

## 2022-07-16 ENCOUNTER — Encounter (INDEPENDENT_AMBULATORY_CARE_PROVIDER_SITE_OTHER): Payer: Self-pay

## 2022-07-18 ENCOUNTER — Other Ambulatory Visit: Payer: Self-pay | Admitting: Cardiovascular Disease

## 2022-07-18 ENCOUNTER — Ambulatory Visit: Payer: TRICARE For Life (TFL) | Admitting: Nurse Practitioner

## 2022-07-18 DIAGNOSIS — I6523 Occlusion and stenosis of bilateral carotid arteries: Secondary | ICD-10-CM

## 2022-07-25 ENCOUNTER — Ambulatory Visit: Payer: Medicare HMO | Attending: Cardiovascular Disease

## 2022-07-25 DIAGNOSIS — I6523 Occlusion and stenosis of bilateral carotid arteries: Secondary | ICD-10-CM

## 2022-08-02 ENCOUNTER — Ambulatory Visit: Payer: TRICARE For Life (TFL) | Admitting: Nurse Practitioner

## 2022-08-04 ENCOUNTER — Other Ambulatory Visit: Payer: Self-pay | Admitting: Family Medicine

## 2022-08-27 ENCOUNTER — Other Ambulatory Visit: Payer: Self-pay | Admitting: Family Medicine

## 2022-09-05 ENCOUNTER — Ambulatory Visit (INDEPENDENT_AMBULATORY_CARE_PROVIDER_SITE_OTHER): Payer: Medicare HMO

## 2022-09-05 VITALS — Ht 63.0 in | Wt 130.0 lb

## 2022-09-05 DIAGNOSIS — Z0000 Encounter for general adult medical examination without abnormal findings: Secondary | ICD-10-CM | POA: Diagnosis not present

## 2022-09-05 NOTE — Patient Instructions (Addendum)
[name] , Thank you for taking time to come for your Medicare Wellness Visit. I appreciate your ongoing commitment to your health goals. Please review the following plan we discussed and let me know if I can assist you in the future.   These are the goals we discussed:     Goals Addressed             This Visit's Progress    Patient Stated   On track    Would like to try and walk more.       This is a list of the screening recommended for you and due dates:  Health Maintenance  Topic Date Due   Flu Shot  04/17/2022   Yearly kidney health urinalysis for diabetes  04/26/2022   COVID-19 Vaccine (4 - 2023-24 season) 09/21/2022*   Zoster (Shingles) Vaccine (1 of 2) 12/05/2022*   Hemoglobin A1C  09/13/2022   Complete foot exam   11/01/2022   Yearly kidney function blood test for diabetes  02/15/2023   DTaP/Tdap/Td vaccine (2 - Tdap) 03/18/2023   Eye exam for diabetics  05/09/2023   Medicare Annual Wellness Visit  09/06/2023   Pneumonia Vaccine  Completed   DEXA scan (bone density measurement)  Completed   HPV Vaccine  Aged Out  *Topic was postponed. The date shown is not the original due date.    Advanced directives: End of life planning; Advance aging; Advanced directives discussed.  Copy of current HCPOA/Living Will requested.    Conditions/risks identified: none new  Next appointment: Follow up in one year for your annual wellness visit    Preventive Care 65 Years and Older, Female Preventive care refers to lifestyle choices and visits with your health care provider that can promote health and wellness. What does preventive care include? A yearly physical exam. This is also called an annual well check. Dental exams once or twice a year. Routine eye exams. Ask your health care provider how often you should have your eyes checked. Personal lifestyle choices, including: Daily care of your teeth and gums. Regular physical activity. Eating a healthy diet. Avoiding  tobacco and drug use. Limiting alcohol use. Practicing safe sex. Taking low-dose aspirin every day. Taking vitamin and mineral supplements as recommended by your health care provider. What happens during an annual well check? The services and screenings done by your health care provider during your annual well check will depend on your age, overall health, lifestyle risk factors, and family history of disease. Counseling  Your health care provider may ask you questions about your: Alcohol use. Tobacco use. Drug use. Emotional well-being. Home and relationship well-being. Sexual activity. Eating habits. History of falls. Memory and ability to understand (cognition). Work and work Statistician. Reproductive health. Screening  You may have the following tests or measurements: Height, weight, and BMI. Blood pressure. Lipid and cholesterol levels. These may be checked every 5 years, or more frequently if you are over 35 years old. Skin check. Lung cancer screening. You may have this screening every year starting at age 26 if you have a 30-pack-year history of smoking and currently smoke or have quit within the past 15 years. Fecal occult blood test (FOBT) of the stool. You may have this test every year starting at age 72. Flexible sigmoidoscopy or colonoscopy. You may have a sigmoidoscopy every 5 years or a colonoscopy every 10 years starting at age 72. Hepatitis C blood test. Hepatitis B blood test. Sexually transmitted disease (STD) testing. Diabetes screening.  This is done by checking your blood sugar (glucose) after you have not eaten for a while (fasting). You may have this done every 1-3 years. Bone density scan. This is done to screen for osteoporosis. You may have this done starting at age 74. Mammogram. This may be done every 1-2 years. Talk to your health care provider about how often you should have regular mammograms. Talk with your health care provider about your test  results, treatment options, and if necessary, the need for more tests. Vaccines  Your health care provider may recommend certain vaccines, such as: Influenza vaccine. This is recommended every year. Tetanus, diphtheria, and acellular pertussis (Tdap, Td) vaccine. You may need a Td booster every 10 years. Zoster vaccine. You may need this after age 78. Pneumococcal 13-valent conjugate (PCV13) vaccine. One dose is recommended after age 40. Pneumococcal polysaccharide (PPSV23) vaccine. One dose is recommended after age 73. Talk to your health care provider about which screenings and vaccines you need and how often you need them. This information is not intended to replace advice given to you by your health care provider. Make sure you discuss any questions you have with your health care provider. Document Released: 09/30/2015 Document Revised: 05/23/2016 Document Reviewed: 07/05/2015 Elsevier Interactive Patient Education  2017 [place] Prevention in the Home Falls can cause injuries. They can happen to people of all ages. There are many things you can do to make your home safe and to help prevent falls. What can I do on the outside of my home? Regularly fix the edges of walkways and driveways and fix any cracks. Remove anything that might make you trip as you walk through a door, such as a raised step or threshold. Trim any bushes or trees on the path to your home. Use bright outdoor lighting. Clear any walking paths of anything that might make someone trip, such as rocks or tools. Regularly check to see if handrails are loose or broken. Make sure that both sides of any steps have handrails. Any raised decks and porches should have guardrails on the edges. Have any leaves, snow, or ice cleared regularly. Use sand or salt on walking paths during winter. Clean up any spills in your garage right away. This includes oil or grease spills. What can I do in the bathroom? Use night  lights. Install grab bars by the toilet and in the tub and shower. Do not use towel bars as grab bars. Use non-skid mats or decals in the tub or shower. If you need to sit down in the shower, use a plastic, non-slip stool. Keep the floor dry. Clean up any water that spills on the floor as soon as it happens. Remove soap buildup in the tub or shower regularly. Attach bath mats securely with double-sided non-slip rug tape. Do not have throw rugs and other things on the floor that can make you trip. What can I do in the bedroom? Use night lights. Make sure that you have a light by your bed that is easy to reach. Do not use any sheets or blankets that are too big for your bed. They should not hang down onto the floor. Have a firm chair that has side arms. You can use this for support while you get dressed. Do not have throw rugs and other things on the floor that can make you trip. What can I do in the kitchen? Clean up any spills right away. Avoid walking on wet floors. Keep items  that you use a lot in easy-to-reach places. If you need to reach something above you, use a strong step stool that has a grab bar. Keep electrical cords out of the way. Do not use floor polish or wax that makes floors slippery. If you must use wax, use non-skid floor wax. Do not have throw rugs and other things on the floor that can make you trip. What can I do with my stairs? Do not leave any items on the stairs. Make sure that there are handrails on both sides of the stairs and use them. Fix handrails that are broken or loose. Make sure that handrails are as long as the stairways. Check any carpeting to make sure that it is firmly attached to the stairs. Fix any carpet that is loose or worn. Avoid having throw rugs at the top or bottom of the stairs. If you do have throw rugs, attach them to the floor with carpet tape. Make sure that you have a light switch at the top of the stairs and the bottom of the stairs. If  you do not have them, ask someone to add them for you. What else can I do to help prevent falls? Wear shoes that: Do not have high heels. Have rubber bottoms. Are comfortable and fit you well. Are closed at the toe. Do not wear sandals. If you use a stepladder: Make sure that it is fully opened. Do not climb a closed stepladder. Make sure that both sides of the stepladder are locked into place. Ask someone to hold it for you, if possible. Clearly mark and make sure that you can see: Any grab bars or handrails. First and last steps. Where the edge of each step is. Use tools that help you move around (mobility aids) if they are needed. These include: Canes. Walkers. Scooters. Crutches. Turn on the lights when you go into a dark area. Replace any light bulbs as soon as they burn out. Set up your furniture so you have a clear path. Avoid moving your furniture around. If any of your floors are uneven, fix them. If there are any pets around you, be aware of where they are. Review your medicines with your doctor. Some medicines can make you feel dizzy. This can increase your chance of falling. Ask your doctor what other things that you can do to help prevent falls. This information is not intended to replace advice given to you by your health care provider. Make sure you discuss any questions you have with your health care provider. Document Released: 06/30/2009 Document Revised: 02/09/2016 Document Reviewed: 10/08/2014 Elsevier Interactive Patient Education  2017 [employer].

## 2022-09-05 NOTE — Progress Notes (Signed)
Subjective:   Tracey [name] is a 86 y.o. female who presents for Medicare Annual (Subsequent) preventive examination.  Review of Systems    No ROS.  Medicare Wellness Virtual Visit.  Visual/audio telehealth visit, UTA vital signs.   See social history for additional risk factors.   Cardiac Risk Factors include: advanced age (>37mn, >>16women)     Objective:    Today's Vitals   09/05/22 1333  Weight: 130 lb (59 kg)  Height: _0  (1.6 m)   Body mass index is 23.03 kg/m.     09/05/2022    1:38 PM 08/24/2021    2:03 PM 01/06/2020    6:00 PM 01/06/2020   11:07 AM 12/25/2019    8:30 AM 08/26/2019    7:29 AM 07/08/2019    8:45 AM  Advanced Directives  Does Patient Have a Medical Advance Directive? Yes Yes Yes No No Yes Yes  Type of [place] will [place] will Healthcare Power of [place]Living will [place] will  Does patient want to make changes to medical advance directive? No - Patient declined Yes (MAU/Ambulatory/Procedural Areas - Information given) Yes (Inpatient - patient defers changing a medical advance directive and declines information at this time)    No - Patient declined  Copy of [place] Chart? No - copy requested  No - copy requested      Would patient like information on creating a medical advance directive?   Yes (MAU/Ambulatory/Procedural Areas - Information given)  Yes (MAU/Ambulatory/Procedural Areas - Information given)  No - Patient declined    Current Medications (verified) Outpatient Encounter Medications as of 09/05/2022  Medication Sig   ACCU-CHEK GUIDE test strip USE AS INSTRUCTED TO CHECK BLOOD SUGAR ONE TO TWO TIMES A DAY.   acetaminophen (TYLENOL) 500 MG tablet Take 500 mg by mouth in the morning and at bedtime.   amoxicillin-clavulanate (AUGMENTIN) 875-125 MG tablet Take 1 tablet by mouth 2 (two)  times daily. (Patient not taking: Reported on 06/15/2022)   carvedilol (COREG) 3.125 MG tablet Take 1 tablet (3.125 mg total) by mouth daily.   clopidogrel (PLAVIX) 75 MG tablet Take 1 tablet (75 mg total) by mouth daily.   CRANBERRY PO Take 1 tablet by mouth daily.   Cyanocobalamin (VITAMIN B-12 IJ) Inject as directed See admin instructions. Every two months   ezetimibe (ZETIA) 10 MG tablet Take 1 tablet (10 mg total) by mouth daily.   glipiZIDE (GLUCOTROL XL) 2.5 MG 24 hr tablet Take 1 tablet (2.5 mg total) by mouth daily.   lisinopril (ZESTRIL) 10 MG tablet Take 1 tablet (10 mg total) by mouth daily.   metFORMIN (GLUCOPHAGE-XR) 500 MG 24 hr tablet Take 1 tablet (500 mg total) by mouth daily.   Multiple Vitamins-Minerals (MULTIVITAMIN WITH MINERALS) tablet Take 1 tablet by mouth daily.   nitroGLYCERIN (NITROSTAT) 0.4 MG SL tablet Place 1 tablet (0.4 mg total) under the tongue every 5 (five) minutes as needed for chest pain.   OneTouch Delica Lancets 385IMISC USE TO TEST TWICE A DAY   pantoprazole (PROTONIX) 40 MG tablet Take 1 tablet (40 mg total) by mouth daily.   Probiotic Product (ALIGN) 4 MG CAPS Take 1 capsule by mouth daily.   rosuvastatin (CRESTOR) 40 MG tablet Take 1 tablet (40 mg total) by mouth daily.   No facility-administered encounter medications on file as of 09/05/2022.  Allergies (verified) Codeine, Erythromycin, Tramadol, and Ciprofloxacin   History: Past Medical History:  Diagnosis Date   Anxiety    Arthritis    Atrial fibrillation ([place])    history   Chronic kidney disease    ckd stage II   Coronary artery disease    Diabetes mellitus    non insulin dependent   Dysrhythmia    atrial fibrillation, history of   GERD (gastroesophageal reflux disease)    Heart murmur    History of kidney stones    Hyperlipidemia    Hypertension    Peripheral vascular disease ([place])    Rectocele    Syncope 09/2019   UTI (urinary tract infection)    Past Surgical History:   Procedure Laterality Date   ABDOMINAL AORTA STENT     APPENDECTOMY     BREAST BIOPSY Left    20 years ago   BREAST BIOPSY Right 2008   BREAST EXCISIONAL BIOPSY     rt 2008 lt "20 years ago"   CARDIAC CATHETERIZATION     CATARACT EXTRACTION W/PHACO Left 08/15/2015   Procedure: CATARACT EXTRACTION PHACO AND INTRAOCULAR LENS PLACEMENT ([place]);  Surgeon: Estill Cotta, MD;  Location: [facility] ORS;  Service: Ophthalmology;  Laterality: Left;  [place] 01:28 AP% 26.4 CDE 35.73 fluid pack # [id number]   COLONOSCOPY WITH PROPOFOL N/A 01/16/2016   Procedure: COLONOSCOPY WITH PROPOFOL;  Surgeon: Manya Silvas, MD;  Location: [facility] ENDOSCOPY;  Service: Endoscopy;  Laterality: N/A;   CORONARY ANGIOPLASTY WITH STENT PLACEMENT     CORONARY ARTERY BYPASS GRAFT  1995   with LIMA  to the LAD, SVG to OM1, SVG to PDA   ESOPHAGOGASTRODUODENOSCOPY (EGD) WITH PROPOFOL N/A 01/16/2016   Procedure: ESOPHAGOGASTRODUODENOSCOPY (EGD) WITH PROPOFOL;  Surgeon: Manya Silvas, MD;  Location[facility] ENDOSCOPY;  Service: Endoscopy;  Laterality: N/A;   EYE SURGERY     JOINT REPLACEMENT Right    KNEE ARTHROPLASTY Left 01/06/2020   Procedure: COMPUTER ASSISTED TOTAL KNEE ARTHROPLASTY;  Surgeon: Dereck Leep, MD;  Location: [facility] ORS;  Service: Orthopedics;  Laterality: Left;   LOWER EXTREMITY ANGIOGRAPHY N/A 07/08/2019   Procedure: Abdominal Aortagraphy;  Surgeon: Algernon Huxley, MD;  Location: [place] CV LAB;  Service: Cardiovascular;  Laterality: N/A;   RENAL ANGIOGRAPHY Left 08/26/2019   Procedure: RENAL ANGIOGRAPHY;  Surgeon: Algernon Huxley, MD;  Location: [place] CV LAB;  Service: Cardiovascular;  Laterality: Left;   RENAL ARTERY STENT     left, By Dr Hulda Humphrey   REPLACEMENT TOTAL KNEE  03/25/2013   right   TONSILLECTOMY     Family History  Problem Relation Age of Onset   Heart disease Father    Other Brother        open heart surgery   Cancer Brother    Other Brother        open heart surgery   Other Brother         open heart surgery   Cancer Daughter    Hyperlipidemia Daughter    Breast cancer Neg Hx    Kidney cancer Neg Hx    Bladder Cancer Neg Hx    Social History   Socioeconomic History   Marital status: Married    Spouse name: Not on file   Number of children: Not on file   Years of education: Not on file   Highest education level: Not on file  Occupational History   Occupation: school cafeteria    Comment: retired  Tobacco Use  Smoking status: Never   Smokeless tobacco: Never  Vaping Use   Vaping Use: Never used  Substance and Sexual Activity   Alcohol use: No   Drug use: No   Sexual activity: Never  Other Topics Concern   Not on file  Social History Narrative   Patient has 3 daughters. Remarried June 2021.   Social Determinants of Health   Financial Resource Strain: Low Risk  (09/05/2022)   Overall Financial Resource Strain (CARDIA)    Difficulty of Paying Living Expenses: Not hard at all  Food Insecurity: No Food Insecurity (09/05/2022)   Hunger Vital Sign    Worried About Running Out of Food in the Last Year: Never true    Ran Out of Food in the Last Year: Never true  Transportation Needs: No Transportation Needs (09/05/2022)   PRAPARE - Hydrologist (Medical): No    Lack of Transportation (Non-Medical): No  Physical Activity: Insufficiently Active (09/05/2022)   Exercise Vital Sign    Days of Exercise per Week: 7 days    Minutes of Exercise per Session: 10 min  Stress: No Stress Concern Present (09/05/2022)   [place]    Feeling of Stress : Not at all  Social Connections: [place] (09/05/2022)   Social Connection and Isolation Panel [NHANES]    Frequency of Communication with Friends and Family: More than three times a week    Frequency of Social Gatherings with Friends and Family: More than three times a week    Attends Religious Services: More  than 4 times per year    Active Member of [employer] or Organizations: Yes    Attends Music therapist: More than 4 times per year    Marital Status: Married    Tobacco Counseling Counseling given: Not Answered   Clinical Intake:  Pre-visit preparation completed: Yes        Diabetes: Yes (Followed by Endocrinology)  How often do you need to have someone help you when you read instructions, pamphlets, or other written materials from your doctor or pharmacy?: 1 - Never    Interpreter Needed?: No      Activities of Daily Living    09/05/2022    1:41 PM  In your present state of health, do you have any difficulty performing the following activities:  Hearing? 0  Vision? 0  Difficulty concentrating or making decisions? 0  Walking or climbing stairs? 0  Dressing or bathing? 0  Doing errands, shopping? 0  Preparing Food and eating ? N  Using the Toilet? N  In the past six months, have you accidently leaked urine? N  Do you have problems with loss of bowel control? N  Managing your Medications? N  Managing your Finances? N  Housekeeping or managing your Housekeeping? N    Patient Care Team: Tower, Wynelle Fanny, MD as PCP - General (Family Medicine) Manya Silvas, MD (Inactive) (Gastroenterology) Minna Merritts, MD as Consulting Physician (Cardiology) Dingeldein, Remo Lipps, MD as Consulting Physician (Ophthalmology)  Indicate any recent Medical Services you may have received from other than [facility] providers in the past year (date may be approximate).     Assessment:   This is a routine wellness examination for Hiba.  I connected with  [place] [employer] on 09/05/22 by a audio enabled telemedicine application and verified that I am speaking with the correct person using two identifiers.  Patient Location: Home  Provider Location:  Office/Clinic  I discussed the limitations of evaluation and management by telemedicine. The patient expressed understanding and  agreed to proceed.   Hearing/Vision screen Hearing Screening - Comments:: Patient is able to hear conversational tones without difficulty.  No issues reported.   Vision Screening - Comments:: Last exam 05/08/2022, Dr. Jeni Salles @ [facility], wears readers. No retinopathy reported.  Dietary issues and exercise activities discussed: Current Exercise Habits: Home exercise routine, Type of exercise: walking, Intensity: Mild   Goals Addressed             This Visit's Progress    Patient Stated   On track    Would like to try and walk more.       Depression Screen    09/05/2022    1:35 PM 08/24/2021    2:07 PM 04/14/2021   12:39 PM 04/01/2019   12:03 PM 11/06/2017    9:59 AM 10/26/2016   10:20 AM  PHQ 2/9 Scores  PHQ - 2 Score 0 0 0 0 2 0  PHQ- 9 Score     10     Fall Risk    09/05/2022    1:40 PM 08/24/2021    2:05 PM 04/14/2021   12:39 PM 05/07/2019    1:59 PM 04/01/2019   12:03 PM  [place] in the past year? 0 1 1 0 0  Number falls in past yr: 0 0 0  0  Injury with Fall? 0 0 0    Risk for fall due to :  Other (Comment)     Risk for fall due to: Comment  blacked out , seeing cardiologist     Follow up Falls evaluation completed;Falls prevention discussed Falls prevention discussed  Falls evaluation completed     FALL RISK PREVENTION PERTAINING TO THE HOME: Home free of loose throw rugs in walkways, pet beds, electrical cords, etc? Yes  Adequate lighting in your home to reduce risk of falls? Yes   ASSISTIVE DEVICES UTILIZED TO PREVENT FALLS: Life alert? No  Use of a cane, walker or w/c? No  Grab bars in the bathroom? Yes  Shower chair or bench in shower? No  Comfort chair height toilet? Yes   TIMED UP AND GO: Was the test performed? No .    Cognitive Function:    11/06/2017    9:58 AM 10/26/2016   10:21 AM  MMSE - Mini Mental State Exam  Orientation to time 5 5  Orientation to Place 5 5  Registration 3 3  Attention/ Calculation 0 0   Recall 3 3  Language- name 2 objects 0 0  Language- repeat 1 1  Language- follow 3 step command 3 3  Language- read & follow direction 0 0  Write a sentence 0 0  Copy design 0 0  Total score 20 20        09/05/2022    2:00 PM  6CIT Screen  What Year? 0 points  What month? 0 points  What time? 0 points  Count back from 20 0 points  Months in reverse 0 points  Repeat phrase 0 points  Total Score 0 points    Immunizations Immunization History  Administered Date(s) Administered   Fluad Quad(high Dose 65+) 06/15/2019, 09/26/2020   Influenza Split 08/10/2015   Influenza,inj,Quad PF,6+ Mos 05/24/2016, 07/26/2017, 06/03/2018, 07/11/2021   Influenza-Unspecified 08/10/2015, 05/24/2016   Moderna Sars-Covid-2 Vaccination 10/02/2019, 10/30/2019, 08/01/2020   Pneumococcal Conjugate-13 11/05/2016   Pneumococcal Polysaccharide-23 12/11/2017  Td 03/17/2013   Flu Vaccine status: Due, Education has been provided regarding the importance of this vaccine. Advised may receive this vaccine at local pharmacy or Health Dept. Aware to provide a copy of the vaccination record if obtained from local pharmacy or Health Dept. Verbalized acceptance and understanding.  Covid-19 vaccine status: Completed vaccines x3.   Shingrix Completed?: No.    Education has been provided regarding the importance of this vaccine. Patient has been advised to call insurance company to determine out of pocket expense if they have not yet received this vaccine. Advised may also receive vaccine at local pharmacy or Health Dept. Verbalized acceptance and understanding.  Screening Tests Health Maintenance  Topic Date Due   INFLUENZA VACCINE  04/17/2022   Diabetic kidney evaluation - Urine ACR  04/26/2022   COVID-19 Vaccine (4 - 2023-24 season) 09/21/2022 (Originally 05/18/2022)   Zoster Vaccines- Shingrix (1 of 2) 12/05/2022 (Originally 08/17/1986)   HEMOGLOBIN A1C  09/13/2022   FOOT EXAM  11/01/2022   Diabetic kidney  evaluation - eGFR measurement  02/15/2023   DTaP/Tdap/Td (2 - Tdap) 03/18/2023   OPHTHALMOLOGY EXAM  05/09/2023   Medicare Annual Wellness (AWV)  09/06/2023   Pneumonia Vaccine 16+ Years old  Completed   DEXA SCAN  Completed   HPV VACCINES  Aged Out   Health Maintenance Health Maintenance Due  Topic Date Due   INFLUENZA VACCINE  04/17/2022   Diabetic kidney evaluation - Urine ACR  04/26/2022   Lung Cancer Screening: (Low Dose CT Chest recommended if Age 74-80 years, 30 pack-year currently smoking OR have quit w/in 15years.) does not qualify.   Hepatitis C Screening: does not qualify.  Vision Screening: Recommended annual ophthalmology exams for early detection of glaucoma and other disorders of the eye.  Dental Screening: Recommended annual dental exams for proper oral hygiene.  Community Resource Referral / Chronic Care Management: CRR required this visit?  No   CCM required this visit?  No      Plan:     I have personally reviewed and noted the following in the patient's chart:   Medical and social history Use of alcohol, tobacco or illicit drugs  Current medications and supplements including opioid prescriptions. Patient is not currently taking opioid prescriptions. Functional ability and status Nutritional status Physical activity Advanced directives List of other physicians Hospitalizations, surgeries, and ER visits in previous 12 months Vitals Screenings to include cognitive, depression, and falls Referrals and appointments  In addition, I have reviewed and discussed with patient certain preventive protocols, quality metrics, and best practice recommendations. A written personalized care plan for preventive services as well as general preventive health recommendations were provided to patient.     Leta Jungling, LPN   [id number]

## 2022-09-07 DIAGNOSIS — J101 Influenza due to other identified influenza virus with other respiratory manifestations: Secondary | ICD-10-CM | POA: Diagnosis not present

## 2022-09-19 ENCOUNTER — Other Ambulatory Visit: Payer: Self-pay | Admitting: Family Medicine

## 2022-09-20 ENCOUNTER — Ambulatory Visit (INDEPENDENT_AMBULATORY_CARE_PROVIDER_SITE_OTHER): Payer: Medicare HMO | Admitting: Nurse Practitioner

## 2022-09-20 ENCOUNTER — Encounter: Payer: Self-pay | Admitting: Nurse Practitioner

## 2022-09-20 VITALS — BP 146/63 | HR 56 | Ht 63.0 in | Wt 129.2 lb

## 2022-09-20 DIAGNOSIS — E559 Vitamin D deficiency, unspecified: Secondary | ICD-10-CM | POA: Diagnosis not present

## 2022-09-20 DIAGNOSIS — E1122 Type 2 diabetes mellitus with diabetic chronic kidney disease: Secondary | ICD-10-CM

## 2022-09-20 DIAGNOSIS — I10 Essential (primary) hypertension: Secondary | ICD-10-CM | POA: Diagnosis not present

## 2022-09-20 DIAGNOSIS — N182 Chronic kidney disease, stage 2 (mild): Secondary | ICD-10-CM | POA: Diagnosis not present

## 2022-09-20 DIAGNOSIS — E782 Mixed hyperlipidemia: Secondary | ICD-10-CM | POA: Diagnosis not present

## 2022-09-20 LAB — POCT GLYCOSYLATED HEMOGLOBIN (HGB A1C): Hemoglobin A1C: 8.3 % — AB (ref 4.0–5.6)

## 2022-09-20 MED ORDER — SITAGLIPTIN PHOSPHATE 50 MG PO TABS: 50.0000 mg | ORAL_TABLET | Freq: Every day | ORAL | 1 refills | Status: DC

## 2022-09-20 NOTE — Progress Notes (Signed)
09/20/2022, 11:55 AM  Endocrinology follow-up note   Subjective:    Patient ID: Tracey [name], female    DOB: 07-Jan-1936.  Tracey [name] is being seen  in follow-up after she was seen in consultation for management of currently uncontrolled symptomatic diabetes requested by  Abner Greenspan, MD.   Past Medical History:  Diagnosis Date   Anxiety    Arthritis    Atrial fibrillation ([place])    history   Chronic kidney disease    ckd stage II   Coronary artery disease    Diabetes mellitus    non insulin dependent   Dysrhythmia    atrial fibrillation, history of   GERD (gastroesophageal reflux disease)    Heart murmur    History of kidney stones    Hyperlipidemia    Hypertension    Peripheral vascular disease ([place])    Rectocele    Syncope 09/2019   UTI (urinary tract infection)     Past Surgical History:  Procedure Laterality Date   ABDOMINAL AORTA STENT     APPENDECTOMY     BREAST BIOPSY Left    20 years ago   BREAST BIOPSY Right 2008   BREAST EXCISIONAL BIOPSY     rt 2008 lt "20 years ago"   CARDIAC CATHETERIZATION     CATARACT EXTRACTION W/PHACO Left 08/15/2015   Procedure: CATARACT EXTRACTION PHACO AND INTRAOCULAR LENS PLACEMENT ([place]);  Surgeon: Estill Cotta, MD;  Location: [facility] ORS;  Service: Ophthalmology;  Laterality: Left;  [place] 01:28 AP% 26.4 CDE 35.73 fluid pack # [id number]   COLONOSCOPY WITH PROPOFOL N/A 01/16/2016   Procedure: COLONOSCOPY WITH PROPOFOL;  Surgeon: Manya Silvas, MD;  Location: [facility] END[facility]COPY;  Service: Endoscopy;  Laterality: N/A;   CORONARY ANGIOPLASTY WITH STENT PLACEMENT     CORONARY ARTERY BYPASS GRAFT  1995   with LIMA  to the LAD, SVG to OM1, SVG to PDA   ESOPHAGOGASTRODUODENOSCOPY (EGD) WITH PROPOFOL N/A 01/16/2016   Procedure: ESOPHAGOGASTRODUODENOSCOPY (EGD) WITH PROPOFOL;  Surgeon: Manya Silvas, MD;  Location: [facility] ENDOSCOPY;  Service:  Endoscopy;  Laterality: N/A;   EYE SURGERY     JOINT REPLACEMENT Right    KNEE ARTHROPLASTY Left 01/06/2020   Procedure: COMPUTER ASSISTED TOTAL KNEE ARTHROPLASTY;  Surgeon: Dereck Leep, MD;  Location: [facility] ORS;  Service: Orthopedics;  Laterality: Left;   LOWER EXTREMITY ANGIOGRAPHY N/A 07/08/2019   Procedure: Abdominal Aortagraphy;  Surgeon: Algernon Huxley, MD;  Location: [place] [facility] LAB;  Service: Cardiovascular;  Laterality: N/A;   RENAL ANGIOGRAPHY Left 08/26/2019   Procedure: RENAL ANGIOGRAPHY;  Surgeon: Algernon Huxley, MD;  Location: [place] [facility];  Service: Cardiovascular;  Laterality: Left;   RENAL ARTERY STENT     left, By Dr Hulda Humphrey   REPLACEMENT TOTAL KNEE  03/25/2013   right   TONSILLECTOMY      Social History   Socioeconomic History   Marital status: Married    Spouse name: Not on file   Number of children: Not on file   Years of education: Not on file   Highest education level: Not  on file  Occupational History   Occupation: school cafeteria    Comment: retired  Tobacco Use   Smoking status: Never   Smokeless tobacco: Never  Vaping Use   Vaping Use: Never used  Substance and Sexual Activity   Alcohol use: No   Drug use: No   Sexual activity: Never  Other Topics Concern   Not on file  Social History Narrative   Patient has 3 daughters. Remarried June 2021.   Social Determinants of Health   Financial Resource Strain: Low Risk  (09/05/2022)   Overall Financial Resource Strain (CARDIA)    Difficulty of Paying Living Expenses: Not hard at all  Food Insecurity: No Food Insecurity (09/05/2022)   Hunger Vital Sign    Worried About Running Out of Food in the Last Year: Never true    Ran Out of Food in the Last Year: Never true  Transportation Needs: No Transportation Needs (09/05/2022)   PRAPARE - Hydrologist (Medical): No    Lack of Transportation (Non-Medical): No  Physical Activity: Insufficiently Active (09/05/2022)    Exercise Vital Sign    Days of Exercise per Week: 7 days    Minutes of Exercise per Session: 10 min  Stress: No Stress Concern Present (09/05/2022)   [place]    Feeling of Stress : Not at all  Social Connections: [place] (09/05/2022)   Social Connection and Isolation Panel [NHANES]    Frequency of Communication with Friends and Family: More than three times a week    Frequency of Social Gatherings with Friends and Family: More than three times a week    Attends Religious Services: More than 4 times per year    Active Member of [employer] or Organizations: Yes    Attends Music therapist: More than 4 times per year    Marital Status: Married    Family History  Problem Relation Age of Onset   Heart disease Father    Other Brother        open heart surgery   Cancer Brother    Other Brother        open heart surgery   Other Brother        open heart surgery   Cancer Daughter    Hyperlipidemia Daughter    Breast cancer Neg Hx    Kidney cancer Neg Hx    Bladder Cancer Neg Hx     Outpatient Encounter Medications as of 09/20/2022  Medication Sig   ACCU-CHEK GUIDE test strip USE AS INSTRUCTED TO CHECK BLOOD SUGAR ONE TO TWO TIMES A DAY.   acetaminophen (TYLENOL) 500 MG tablet Take 500 mg by mouth in the morning and at bedtime.   carvedilol (COREG) 3.125 MG tablet Take 1 tablet (3.125 mg total) by mouth daily.   clopidogrel (PLAVIX) 75 MG tablet Take 1 tablet (75 mg total) by mouth daily.   CRANBERRY PO Take 1 tablet by mouth daily.   Cyanocobalamin (VITAMIN B-12 IJ) Inject as directed See admin instructions. Every two months   ezetimibe (ZETIA) 10 MG tablet Take 1 tablet (10 mg total) by mouth daily.   glipiZIDE (GLUCOTROL XL) 2.5 MG 24 hr tablet Take 1 tablet (2.5 mg total) by mouth daily.   lisinopril (ZESTRIL) 10 MG tablet Take 1 tablet (10 mg total) by mouth daily.   metFORMIN (GLUCOPHAGE-XR)  500 MG 24 hr tablet Take 1 tablet (500 mg total) by mouth  daily.   Multiple Vitamins-Minerals (MULTIVITAMIN WITH MINERALS) tablet Take 1 tablet by mouth daily.   nitroGLYCERIN (NITROSTAT) 0.4 MG SL tablet Place 1 tablet (0.4 mg total) under the tongue every 5 (five) minutes as needed for chest pain.   OneTouch Delica Lancets 15V MISC USE TO TEST TWICE A DAY   pantoprazole (PROTONIX) 40 MG tablet TAKE 1 TABLET BY MOUTH ONCE DAILY.   Probiotic Product (ALIGN) 4 MG CAPS Take 1 capsule by mouth daily.   rosuvastatin (CRESTOR) 40 MG tablet Take 1 tablet (40 mg total) by mouth daily.   sitaGLIPtin (JANUVIA) 50 MG tablet Take 1 tablet (50 mg total) by mouth daily.   amoxicillin-clavulanate (AUGMENTIN) 875-125 MG tablet Take 1 tablet by mouth 2 (two) times daily. (Patient not taking: Reported on 06/15/2022)   [DISCONTINUED] pantoprazole (PROTONIX) 40 MG tablet Take 1 tablet (40 mg total) by mouth daily.   No facility-administered encounter medications on file as of 09/20/2022.    ALLERGIES: Allergies  Allergen Reactions   Codeine Nausea Only    Sick to her stomach   Erythromycin Nausea And Vomiting and Other (See Comments)   Tramadol Nausea Only and Other (See Comments)    Loss of appetite and did not eat   Ciprofloxacin Diarrhea    Upsets stomach    VACCINATION STATUS: Immunization History  Administered Date(s) Administered   Fluad Quad(high Dose 65+) 06/15/2019, 09/26/2020   Influenza Split 08/10/2015   Influenza,inj,Quad PF,6+ Mos 05/24/2016, 07/26/2017, 06/03/2018, 07/11/2021   Influenza-Unspecified 08/10/2015, 05/24/2016   Moderna Sars-Covid-2 Vaccination 10/02/2019, 10/30/2019, 08/01/2020   Pneumococcal Conjugate-13 11/05/2016   Pneumococcal Polysaccharide-23 12/11/2017   Td 03/17/2013    Diabetes She presents for her follow-up diabetic visit. She has type 2 diabetes mellitus. Onset time: She was diagnosed at approximate age of 24 years. Her disease course has been worsening. There  are no hypoglycemic associated symptoms. Pertinent negatives for hypoglycemia include no confusion, headaches, pallor or seizures. Associated symptoms include blurred vision (especially in the evenings) and fatigue. Pertinent negatives for diabetes include no chest pain, no polydipsia, no polyphagia and no polyuria. There are no hypoglycemic complications. Symptoms are stable. Diabetic complications include heart disease, nephropathy and PVD. Risk factors for coronary artery disease include dyslipidemia, diabetes mellitus, hypertension, sedentary lifestyle and post-menopausal. Current diabetic treatment includes oral agent (dual therapy). She is compliant with treatment all of the time. Her weight is fluctuating minimally. She is following a generally healthy diet. Meal planning includes avoidance of concentrated sweets and ADA exchanges. She rarely participates in exercise. Her home blood glucose trend is fluctuating minimally. Her breakfast blood glucose range is generally 140-180 mg/dl. (She presents today with her meter, no logs, showing mostly at goal fasting glycemic profile.  Her POCT  A1c today is 8.3% increasing slightly from last visit of 8%.  She notes she has had the flu since last visit and did have elevated glucose at that time.  She also notes her appetite has been decreased since she was sick.  Analysis of her meter shows 7-day average of 155, 14-day average of 147, 30-day average of 152, 90-day average of 148.  She denies any hypoglycemia.) An ACE inhibitor/angiotensin II receptor blocker is being taken. She does not see a podiatrist.Eye exam is current.  Hyperlipidemia This is a chronic problem. The current episode started more than 1 year ago. The problem is controlled. Recent lipid tests were reviewed and are normal. Exacerbating diseases include chronic renal disease and diabetes. Factors aggravating her hyperlipidemia include  beta blockers. Pertinent negatives include no chest pain, myalgias  or shortness of breath. Current antihyperlipidemic treatment includes statins. The current treatment provides moderate improvement of lipids. There are no compliance problems.  Risk factors for coronary artery disease include dyslipidemia, diabetes mellitus, hypertension, post-menopausal and a sedentary lifestyle.  Hypertension This is a chronic problem. The current episode started more than 1 year ago. The problem has been gradually improving since onset. The problem is controlled. Associated symptoms include blurred vision (especially in the evenings). Pertinent negatives include no chest pain, headaches, palpitations or shortness of breath. There are no associated agents to hypertension. Risk factors for coronary artery disease include diabetes mellitus, dyslipidemia, sedentary lifestyle and post-menopausal state. Past treatments include beta blockers and ACE inhibitors. The current treatment provides moderate improvement. There are no compliance problems.  Hypertensive end-organ damage includes kidney disease, CAD/MI and PVD. Identifiable causes of hypertension include chronic renal disease.    Review of systems  Constitutional: + Minimally fluctuating body weight,  current Body mass index is 22.89 kg/m. , no fatigue, no subjective hyperthermia, no subjective hypothermia Eyes: no blurry vision, no xerophthalmia ENT: no sore throat, no nodules palpated in throat, no dysphagia/odynophagia, no hoarseness Cardiovascular: no chest pain, no shortness of breath, no palpitations, no leg swelling Respiratory: no cough, no shortness of breath Gastrointestinal: no nausea/vomiting/diarrhea Musculoskeletal: no muscle/joint aches Skin: no rashes, no hyperemia Neurological: no tremors, no numbness, no tingling, no dizziness Psychiatric: no depression, no anxiety   Objective:    BP (!) 146/63 (BP Location: Left Arm, Patient Position: Sitting, Cuff Size: Normal)   Pulse (!) 56   Ht '5\' 3"'$  (1.6 m)   Wt 129  lb 3.2 oz (58.6 kg)   BMI 22.89 kg/m   Wt Readings from Last 3 Encounters:  09/20/22 129 lb 3.2 oz (58.6 kg)  09/05/22 130 lb (59 kg)  06/15/22 130 lb 8 oz (59.2 kg)    BP Readings from Last 3 Encounters:  09/20/22 (!) 146/63  06/15/22 (!) 130/58  03/15/22 140/70     Physical Exam- Limited  Constitutional:  Body mass index is 22.89 kg/m. , not in acute distress, normal state of mind Eyes:  EOMI, no exophthalmos Musculoskeletal: no gross deformities, strength intact in all four extremities, no gross restriction of joint movements Skin:  no rashes, no hyperemia Neurological: no tremor with outstretched hands    CMP ( most recent) CMP     Component Value Date/Time   NA 137 02/14/2022 1222   NA 137 03/27/2013 0545   K 4.6 02/14/2022 1222   K 3.9 03/27/2013 0545   CL 102 02/14/2022 1222   CL 103 03/27/2013 0545   CO2 29 02/14/2022 1222   CO2 27 03/27/2013 0545   GLUCOSE 291 (H) 02/14/2022 1222   GLUCOSE 122 (H) 03/27/2013 0545   BUN 29 (H) 02/14/2022 1222   BUN 10 03/27/2013 0545   CREATININE 1.10 02/14/2022 1222   CREATININE 0.99 (H) 04/24/2021 1043   CALCIUM 10.3 02/14/2022 1222   CALCIUM 8.9 03/27/2013 0545   PROT 7.1 11/01/2021 1038   PROT 7.6 10/07/2011 1316   ALBUMIN 4.5 11/01/2021 1038   ALBUMIN 4.5 10/07/2011 1316   AST 21 11/01/2021 1038   AST 26 10/07/2011 1316   ALT 13 11/01/2021 1038   ALT 26 10/07/2011 1316   ALKPHOS 52 11/01/2021 1038   ALKPHOS 46 (L) 10/07/2011 1316   BILITOT 0.5 11/01/2021 1038   BILITOT 0.5 10/07/2011 1316   GFRNONAA 54 (L) 03/03/2020  0000   GFRAA 63 03/03/2020 0000     Diabetic Labs (most recent): Lab Results  Component Value Date   HGBA1C 8.3 (A) 09/20/2022   HGBA1C 8.0 03/14/2022   HGBA1C 7.7 (A) 10/30/2021   MICROALBUR 30 04/26/2021   MICROALBUR 1.9 03/03/2020     Lipid Panel ( most recent) Lipid Panel     Component Value Date/Time   CHOL 125 11/01/2021 1038   CHOL 158 12/12/2020 1115   CHOL 141 10/08/2011  0457   TRIG 79.0 11/01/2021 1038   TRIG 179 10/08/2011 0457   HDL 48.50 11/01/2021 1038   HDL 52 12/12/2020 1115   HDL 37 (L) 10/08/2011 0457   CHOLHDL 3 11/01/2021 1038   VLDL 15.8 11/01/2021 1038   VLDL 36 10/08/2011 0457   LDLCALC 60 11/01/2021 1038   LDLCALC 70 04/24/2021 1043   LDLCALC 68 10/08/2011 0457      Lab Results  Component Value Date   TSH 2.44 11/01/2021   TSH 1.94 06/07/2021   TSH 1.99 04/24/2021   TSH 1.66 03/03/2020   TSH 2.68 03/05/2019   TSH 2.96 11/06/2017   TSH 1.24 02/01/2017   TSH 1.86 02/06/2016   FREET4 1.4 04/24/2021   FREET4 1.4 03/03/2020      Assessment & Plan:   1) Type 2 diabetes complicated by stage 2 CKD  - Deane M Altieri has currently controlled asymptomatic type 2 DM since  87 years of age.  She presents today with her meter, no logs, showing mostly at goal fasting glycemic profile.  Her POCT  A1c today is 8.3% increasing slightly from last visit of 8%.  She notes she has had the flu since last visit and did have elevated glucose at that time.  She also notes her appetite has been decreased since she was sick.  Analysis of her meter shows 7-day average of 155, 14-day average of 147, 30-day average of 152, 90-day average of 148.  She denies any hypoglycemia.  - I had a long discussion with her about the progressive nature of diabetes and the pathology behind its complications. -her diabetes is complicated by stage 2 CKD and she remains at a high risk for more acute and chronic complications which include CAD, CVA, CKD, retinopathy, and neuropathy. These are all discussed in detail with her.  The following Lifestyle Medicine recommendations according to [facility]) were discussed and offered to patient and she agrees to start the journey:  A. Whole Foods, Plant-based plate comprising of fruits and vegetables, plant-based proteins, whole-grain carbohydrates was discussed in detail with the patient.   A list  for source of those nutrients were also provided to the patient.  Patient will use only water or unsweetened tea for hydration. B.  The need to stay away from risky substances including alcohol, smoking; obtaining 7 to 9 hours of restorative sleep, at least 150 minutes of moderate intensity exercise weekly, the importance of healthy social connections,  and stress reduction techniques were discussed. C.  A full color page of  Calorie density of various food groups per pound showing examples of each food groups was provided to the patient.  - Nutritional counseling repeated at each appointment due to patients tendency to fall back in to old habits.  - The patient admits there is a room for improvement in their diet and drink choices. -  Suggestion is made for the patient to avoid simple carbohydrates from their diet including Cakes, Sweet Desserts /  Pastries, Ice Cream, Soda (diet and regular), Sweet Tea, Candies, Chips, Cookies, Sweet Pastries, Store Bought Juices, Alcohol in Excess of 1-2 drinks a day, Artificial Sweeteners, Coffee Creamer, and "Sugar-free" Products. This will help patient to have stable blood glucose profile and potentially avoid unintended weight gain.   - I encouraged the patient to switch to unprocessed or minimally processed complex starch and increased protein intake (animal or plant source), fruits, and vegetables.   - Patient is advised to stick to a routine mealtimes to eat 3 meals a day and avoid unnecessary snacks (to snack only to correct hypoglycemia).  - I have approached her with the following individualized plan to manage  her diabetes and patient agrees:   -She is advised to stop her Metformin and try Januvia 50 mg po daily.  She can continue her Glipizide 2.5 mg XL PO daily with breakfast for now .    -She is encouraged to continue monitoring blood glucose at least once daily, before breakfast and call the clinic if she gets readings less than 70 or greater than  200 for 3 tests in a row.  - she is not a candidate for SGLT2 inhibitors or full dose Metformin due to concurrent renal insufficiency.  - she is not a candidate for incretin therapy due to her body habitus.    - Patient specific target  A1c;  LDL, HDL, Triglycerides, and  Waist Circumference were discussed in detail.  2) Blood Pressure /Hypertension:  Her blood pressure is controlled to target.  She is advised to continue Coreg 3.125 mg po twice daily and Lisinopril 10 mg po daily.   3) Lipids/Hyperlipidemia: Her most recent lipid panel from 11/01/21 shows controlled LDL at 60.  She is advised to continue Crestor 40 mg po daily at bedtime.  Side effects and precautions discussed with her.    4)  Weight/Diet: Her Body mass index is 22.89 kg/m.  -She is not a candidate for weight loss.  Exercise, and detailed carbohydrates information provided  -  detailed on discharge instructions.  5) Chronic Care/Health Maintenance: -she is on ACEI/ARB and Statin medications and  is encouraged to initiate and continue to follow up with Ophthalmology, Dentist,  Podiatrist at least yearly or according to recommendations, and advised to   stay away from smoking. I have recommended yearly flu vaccine and pneumonia vaccine at least every 5 years; moderate intensity exercise for up to 150 minutes weekly; and  sleep for at least 7 hours a day.  - she is advised to maintain close follow up with [place], Wynelle Fanny, MD for primary care needs, as well as her other providers for optimal and coordinated care.      I spent 42 minutes in the care of the patient today including review of labs from [place], Lipids, Thyroid Function, Hematology (current and previous including abstractions from other facilities); face-to-face time discussing  her blood glucose readings/logs, discussing hypoglycemia and hyperglycemia episodes and symptoms, medications doses, her options of short and long term treatment based on the latest standards  of care / guidelines;  discussion about incorporating lifestyle medicine;  and documenting the encounter. Risk reduction counseling performed per USPSTF guidelines to reduce obesity and cardiovascular risk factors.     Please refer to Patient Instructions for Blood Glucose Monitoring and Insulin/Medications Dosing Guide"  in media tab for additional information. Please  also refer to " Patient Self Inventory" in the Media  tab for reviewed elements of pertinent patient history.  Briele  M Margolis participated in the discussions, expressed understanding, and voiced agreement with the above plans.  All questions were answered to her satisfaction. she is encouraged to contact clinic should she have any questions or concerns prior to her return visit.    Follow up plan: - Return in about 3 months (around 12/20/2022) for Diabetes F/U with A1c in office, Previsit labs, Bring meter and logs.  Rayetta Pigg, [facility] [address] [place], [place] [address] Phone: [phone] Fax: [phone]  09/20/2022, 11:55 AM

## 2022-10-15 ENCOUNTER — Other Ambulatory Visit: Payer: Self-pay | Admitting: Nurse Practitioner

## 2022-10-17 ENCOUNTER — Other Ambulatory Visit: Payer: Self-pay | Admitting: Cardiovascular Disease

## 2022-10-17 DIAGNOSIS — I6523 Occlusion and stenosis of bilateral carotid arteries: Secondary | ICD-10-CM

## 2022-10-30 ENCOUNTER — Other Ambulatory Visit: Payer: Self-pay | Admitting: Family Medicine

## 2022-10-30 NOTE — Telephone Encounter (Signed)
Last CPE ws 11/01/21, please schedule CPE with [name] and then route back to me to refill   (Can do labs prior if she wants or same day. Pt lives far away)

## 2022-10-31 NOTE — Telephone Encounter (Signed)
Patient has been scheduled.

## 2022-11-13 ENCOUNTER — Encounter: Payer: Self-pay | Admitting: Family Medicine

## 2022-11-13 ENCOUNTER — Ambulatory Visit (INDEPENDENT_AMBULATORY_CARE_PROVIDER_SITE_OTHER): Payer: Medicare HMO | Admitting: Family Medicine

## 2022-11-13 VITALS — BP 132/66 | HR 100 | Temp 97.7°F | Ht 61.75 in | Wt 129.2 lb

## 2022-11-13 DIAGNOSIS — M159 Polyosteoarthritis, unspecified: Secondary | ICD-10-CM

## 2022-11-13 DIAGNOSIS — I480 Paroxysmal atrial fibrillation: Secondary | ICD-10-CM | POA: Diagnosis not present

## 2022-11-13 DIAGNOSIS — Z0000 Encounter for general adult medical examination without abnormal findings: Secondary | ICD-10-CM

## 2022-11-13 DIAGNOSIS — E559 Vitamin D deficiency, unspecified: Secondary | ICD-10-CM

## 2022-11-13 DIAGNOSIS — M150 Primary generalized (osteo)arthritis: Secondary | ICD-10-CM

## 2022-11-13 DIAGNOSIS — I10 Essential (primary) hypertension: Secondary | ICD-10-CM

## 2022-11-13 DIAGNOSIS — D649 Anemia, unspecified: Secondary | ICD-10-CM | POA: Diagnosis not present

## 2022-11-13 DIAGNOSIS — I700 Atherosclerosis of aorta: Secondary | ICD-10-CM

## 2022-11-13 DIAGNOSIS — E785 Hyperlipidemia, unspecified: Secondary | ICD-10-CM

## 2022-11-13 DIAGNOSIS — E538 Deficiency of other specified B group vitamins: Secondary | ICD-10-CM

## 2022-11-13 DIAGNOSIS — N182 Chronic kidney disease, stage 2 (mild): Secondary | ICD-10-CM

## 2022-11-13 DIAGNOSIS — K219 Gastro-esophageal reflux disease without esophagitis: Secondary | ICD-10-CM

## 2022-11-13 DIAGNOSIS — M810 Age-related osteoporosis without current pathological fracture: Secondary | ICD-10-CM | POA: Diagnosis not present

## 2022-11-13 DIAGNOSIS — E1169 Type 2 diabetes mellitus with other specified complication: Secondary | ICD-10-CM

## 2022-11-13 DIAGNOSIS — I25118 Atherosclerotic heart disease of native coronary artery with other forms of angina pectoris: Secondary | ICD-10-CM

## 2022-11-13 DIAGNOSIS — N289 Disorder of kidney and ureter, unspecified: Secondary | ICD-10-CM | POA: Diagnosis not present

## 2022-11-13 DIAGNOSIS — I701 Atherosclerosis of renal artery: Secondary | ICD-10-CM

## 2022-11-13 DIAGNOSIS — E1122 Type 2 diabetes mellitus with diabetic chronic kidney disease: Secondary | ICD-10-CM

## 2022-11-13 LAB — COMPREHENSIVE METABOLIC PANEL
ALT: 19 U/L (ref 0–35)
AST: 26 U/L (ref 0–37)
Albumin: 4.2 g/dL (ref 3.5–5.2)
Alkaline Phosphatase: 59 U/L (ref 39–117)
BUN: 24 mg/dL — ABNORMAL HIGH (ref 6–23)
CO2: 28 mEq/L (ref 19–32)
Calcium: 10.9 mg/dL — ABNORMAL HIGH (ref 8.4–10.5)
Chloride: 104 mEq/L (ref 96–112)
Creatinine, Ser: 1.04 mg/dL (ref 0.40–1.20)
GFR: 49.45 mL/min — ABNORMAL LOW (ref >60.00)
Glucose, Bld: 258 mg/dL — ABNORMAL HIGH (ref 70–99)
Potassium: 4.9 mEq/L (ref 3.5–5.1)
Sodium: 139 mEq/L (ref 135–145)
Total Bilirubin: 0.4 mg/dL (ref 0.2–1.2)
Total Protein: 6.4 g/dL (ref 6.0–8.3)

## 2022-11-13 LAB — VITAMIN D 25 HYDROXY (VIT D DEFICIENCY, FRACTURES): VITD: 27.96 ng/mL — ABNORMAL LOW (ref 30.00–100.00)

## 2022-11-13 LAB — CBC WITH DIFFERENTIAL/PLATELET
Basophils Absolute: 0 10*3/uL (ref 0.0–0.1)
Basophils Relative: 0.5 % (ref 0.0–3.0)
Eosinophils Absolute: 0.1 10*3/uL (ref 0.0–0.7)
Eosinophils Relative: 2 % (ref 0.0–5.0)
HCT: 36.6 % (ref 36.0–46.0)
Hemoglobin: 12.2 g/dL (ref 12.0–15.0)
Lymphocytes Relative: 30.9 % (ref 12.0–46.0)
Lymphs Abs: 1.3 10*3/uL (ref 0.7–4.0)
MCHC: 33.3 g/dL (ref 30.0–36.0)
MCV: 88.2 fl (ref 78.0–100.0)
Monocytes Absolute: 0.4 10*3/uL (ref 0.1–1.0)
Monocytes Relative: 8.5 % (ref 3.0–12.0)
Neutro Abs: 2.4 10*3/uL (ref 1.4–7.7)
Neutrophils Relative %: 58.1 % (ref 43.0–77.0)
Platelets: 165 10*3/uL (ref 150.0–400.0)
RBC: 4.15 Mil/uL (ref 3.87–5.11)
RDW: 13.7 % (ref 11.5–15.5)
WBC: 4.1 10*3/uL (ref 4.0–10.5)

## 2022-11-13 LAB — VITAMIN B12: Vitamin B-12: 290 pg/mL (ref 211–911)

## 2022-11-13 LAB — LIPID PANEL
Cholesterol: 125 mg/dL (ref 0–200)
HDL: 49.2 mg/dL (ref >39.00)
LDL Cholesterol: 55 mg/dL (ref 0–99)
NonHDL: 75.79
Total CHOL/HDL Ratio: 3
Triglycerides: 104 mg/dL (ref 0.0–149.0)
VLDL: 20.8 mg/dL (ref 0.0–40.0)

## 2022-11-13 LAB — TSH: TSH: 1.78 u[IU]/mL (ref 0.35–5.50)

## 2022-11-13 NOTE — Patient Instructions (Addendum)
Keep up the exercise and think about a bike   Continue the protonix every other day for now   You need at least 2000 iu of vitamin D3 daily  That may or may not be in your calcium supplement - check on that, add it you need to   Stay active  Take care of yourself   Labs today

## 2022-11-13 NOTE — Assessment & Plan Note (Signed)
In setting of OP Level today  Enc her to add to supplement if it does not contain  Takes ppi as well-in process of weaning Tries to get outdoors

## 2022-11-13 NOTE — Assessment & Plan Note (Addendum)
Disc goals for lipids and reasons to control them Rev last labs with pt Rev low sat fat diet in detail crestor 40 mg daily -tolerates well Also zetia 10 mg daily  Lab today

## 2022-11-13 NOTE — Assessment & Plan Note (Signed)
Rev last cardiology note Stable Continues plavix and carvedilol

## 2022-11-13 NOTE — Assessment & Plan Note (Signed)
bp in fair control at this time  BP Readings from Last 1 Encounters:  11/13/22 132/66   No changes needed Most recent labs reviewed  Disc lifstyle change with low sodium diet and exercise  Under card and vasc care Coreg 3.125 bid Lisinopril 10 mg daily

## 2022-11-13 NOTE — Assessment & Plan Note (Signed)
No clinical changes Continues cardiology care  Coreg  Plavix

## 2022-11-13 NOTE — Assessment & Plan Note (Signed)
Last A1c up at 8.3  Under endo care  May be progressing with age

## 2022-11-13 NOTE — Assessment & Plan Note (Signed)
Filled out handicapped parking form today

## 2022-11-13 NOTE — Assessment & Plan Note (Signed)
Lab today.

## 2022-11-13 NOTE — Progress Notes (Signed)
Subjective:    Patient ID: Tracey [name], female    DOB: 02-23-1936, 87 y.o.   MRN: [id number]  HPI Here for health maintenance exam and to review chronic medical problems    Wt Readings from Last 3 Encounters:  11/13/22 129 lb 4 oz (58.6 kg)  09/20/22 129 lb 3.2 oz (58.6 kg)  09/05/22 130 lb (59 kg)   23.83 kg/m  Got flu in December-treated with tamiflu  Finally got flu shot last month    Immunization History  Administered Date(s) Administered   Fluad Quad(high Dose 65+) 06/15/2019, 09/26/2020   Influenza Split 08/10/2015   Influenza, High Dose Seasonal PF 11/05/2022   Influenza,inj,Quad PF,6+ Mos 05/24/2016, 07/26/2017, 06/03/2018, 07/11/2021   Influenza-Unspecified 08/10/2015, 05/24/2016   Moderna Sars-Covid-2 Vaccination 10/02/2019, 10/30/2019, 08/01/2020   Pneumococcal Conjugate-13 11/05/2016   Pneumococcal Polysaccharide-23 12/11/2017   Td 03/17/2013   Health Maintenance Due  Topic Date Due   Diabetic kidney evaluation - Urine ACR  04/26/2022   FOOT EXAM  11/01/2022   Dexa  12/2021 - OP Falls- none  Fractures-none recent   Supplements - takes ca/mag/zinc  Exercise - starts with 15 minutes every morning  Stretches/lifting/walking  Very active during the day- then will sometimes add 15 more minutes / looks forward to walking more outside when weather is better also  Thinking about an exercise bike   Lab Results  Component Value Date   CALCIUM 10.3 02/14/2022    Mammogram 12/2021 Self breast exam : no lumps   Colonoscopy 2017   HTN bp is stable today  No cp or palpitations or headaches or edema  No side effects to medicines  BP Readings from Last 3 Encounters:  11/13/22 132/66  09/20/22 (!) 146/63  06/15/22 (!) 130/58    Coreg 3.125 mg bid Lisinopril 10 mg daily   Under card care and vasc care for CAD/ PAD and afib  Carotid stenosis Renal art stenosis  Aortic atherosclerosis  Sees Dr Lucky Cowboy in April - has to tell him about cramps   Due for  labs   Lab Results  Component Value Date   CREATININE 1.10 02/14/2022   BUN 29 (H) 02/14/2022   NA 137 02/14/2022   K 4.6 02/14/2022   CL 102 02/14/2022   CO2 29 02/14/2022   More cramps lately    DM2 Sees endocrinology  A1c 8.3 -running high / struggling with that   Not eating differently    Taking plavix   Needs handicapped parking form done Only uses it if she has a bad day with her knees  Had both knees replaced     Hyperlipidemia Lab Results  Component Value Date   CHOL 125 11/01/2021   HDL 48.50 11/01/2021   LDLCALC 60 11/01/2021   TRIG 79.0 11/01/2021   CHOLHDL 3 11/01/2021   Zetia 10 Crestor 40 mg daily   Low B12 and low D in past  Takes protonix  She tried to come off of it and had some symptoms    Patient Active Problem List   Diagnosis Date Noted   Leukocytes in urine 03/15/2022   Kyphoscoliosis 11/24/2021   Low back pain 11/21/2021   Estrogen deficiency 11/01/2021   Arthritis of carpometacarpal [facility]) joint of left thumb 05/19/2021   Muscle cramps 05/15/2021   Distal radius fracture, left 05/05/2021   Dysuria 04/14/2021   Hypercalcemia 03/01/2020   General weakness 02/05/2020   Total knee replacement status 01/06/2020   Rectocele 09/27/2019   Vaginal atrophy  09/27/2019   Renal artery stenosis ([place]) 06/30/2019   Varicose veins of both lower extremities with inflammation 05/27/2019   Essential hypertension, benign 05/07/2019   Coronary artery disease of native artery of native heart with stable angina pectoris ([place]) 04/01/2019   Syncope 12/25/2018   Palpitations 12/25/2018   PAD (peripheral artery disease) ([place]) 12/25/2018   Stenosis of left carotid artery 12/25/2018   Primary osteoarthritis of left knee 04/27/2018   Weight loss 01/13/2018   PAF (paroxysmal atrial fibrillation) ([place]) 11/11/2017   Routine general medical examination at a health care facility 11/11/2017   Renal insufficiency 11/11/2017   Osteoporosis 04/30/2017   Anemia  02/12/2017   Vitamin D deficiency 02/03/2017   Vitamin B12 deficiency 11/05/2016   Change in bowel habits 03/07/2015   FH: colon cancer 03/07/2015   GERD (gastroesophageal reflux disease) 03/07/2015   Osteoarthritis 03/15/2014   Status post aorto-coronary artery bypass graft 01/22/2012   Hyperlipidemia associated with type 2 diabetes mellitus ([place]) 01/22/2012   Type 2 diabetes mellitus with stage 2 chronic kidney disease, without long-term current use of insulin ([place]) 01/22/2012   Atherosclerosis of renal artery ([place]) 01/22/2012   Past Medical History:  Diagnosis Date   Anxiety    Arthritis    Atrial fibrillation ([place])    history   Chronic kidney disease    ckd stage II   Coronary artery disease    Diabetes mellitus    non insulin dependent   Dysrhythmia    atrial fibrillation, history of   GERD (gastroesophageal reflux disease)    Heart murmur    History of kidney stones    Hyperlipidemia    Hypertension    Peripheral vascular disease ([place])    Rectocele    Syncope 09/2019   UTI (urinary tract infection)    Past Surgical History:  Procedure Laterality Date   ABDOMINAL AORTA STENT     APPENDECTOMY     BREAST BIOPSY Left    20 years ago   BREAST BIOPSY Right 2008   BREAST EXCISIONAL BIOPSY     rt 2008 lt "20 years ago"   CARDIAC CATHETERIZATION     CATARACT EXTRACTION W/PHACO Left 08/15/2015   Procedure: CATARACT EXTRACTION PHACO AND INTRAOCULAR LENS PLACEMENT ([place]);  Surgeon: Estill Cotta, MD;  Location: [facility] ORS;  Service: Ophthalmology;  Laterality: Left;  [place] 01:28 AP% 26.4 CDE 35.73 fluid pack # [id number]   COLONOSCOPY WITH PROPOFOL N/A 01/16/2016   Procedure: COLONOSCOPY WITH PROPOFOL;  Surgeon: Manya Silvas, MD;  Location: [facility] ENDOSCOPY;  Service: Endoscopy;  Laterality: N/A;   CORONARY ANGIOPLASTY WITH STENT PLACEMENT     CORONARY ARTERY BYPASS GRAFT  1995   with LIMA  to the LAD, SVG to OM1, SVG to PDA   ESOPHAGOGASTRODUODENOSCOPY (EGD) WITH  PROPOFOL N/A 01/16/2016   Procedure: ESOPHAGOGASTRODUODENOSCOPY (EGD) WITH PROPOFOL;  Surgeon: Manya Silvas, MD;  Location: [facility] ENDOSCOPY;  Service: Endoscopy;  Laterality: N/A;   EYE SURGERY     JOINT REPLACEMENT Right    KNEE ARTHROPLASTY Left 01/06/2020   Procedure: COMPUTER ASSISTED TOTAL KNEE ARTHROPLASTY;  Surgeon: Dereck Leep, MD;  Location: [facility] ORS;  Service: Orthopedics;  Laterality: Left;   LOWER EXTREMITY ANGIOGRAPHY N/A 07/08/2019   Procedure: Abdominal Aortagraphy;  Surgeon: Algernon Huxley, MD;  Location: [place] [facility] LAB;  Service: Cardiovascular;  Laterality: N/A;   RENAL ANGIOGRAPHY Left 08/26/2019   Procedure: RENAL ANGIOGRAPHY;  Surgeon: Algernon Huxley, MD;  Location: [place] [facility];  Service:  Cardiovascular;  Laterality: Left;   RENAL ARTERY STENT     left, By Dr Hulda Humphrey   REPLACEMENT TOTAL KNEE  03/25/2013   right   TONSILLECTOMY     Social History   Tobacco Use   Smoking status: Never   Smokeless tobacco: Never  Vaping Use   Vaping Use: Never used  Substance Use Topics   Alcohol use: No   Drug use: No   Family History  Problem Relation Age of Onset   Heart disease Father    Other Brother        open heart surgery   Cancer Brother    Other Brother        open heart surgery   Other Brother        open heart surgery   Cancer Daughter    Hyperlipidemia Daughter    Breast cancer Neg Hx    Kidney cancer Neg Hx    Bladder Cancer Neg Hx    Allergies  Allergen Reactions   Codeine Nausea Only    Sick to her stomach   Erythromycin Nausea And Vomiting and Other (See Comments)   Tramadol Nausea Only and Other (See Comments)    Loss of appetite and did not eat   Ciprofloxacin Diarrhea    Upsets stomach   Current Outpatient Medications on File Prior to Visit  Medication Sig Dispense Refill   ACCU-CHEK GUIDE test strip USE AS INSTRUCTED TO CHECK BLOOD SUGAR ONE TO TWO TIMES A DAY. 100 strip 2   acetaminophen (TYLENOL) 500 MG tablet Take 500 mg by  mouth in the morning and at bedtime.     carvedilol (COREG) 3.125 MG tablet Take 1 tablet (3.125 mg total) by mouth daily. 90 tablet 3   clopidogrel (PLAVIX) 75 MG tablet Take 1 tablet (75 mg total) by mouth daily. 90 tablet 3   CRANBERRY PO Take 1 tablet by mouth daily.     Cyanocobalamin (VITAMIN B-12 IJ) Inject as directed See admin instructions. Every two months     ezetimibe (ZETIA) 10 MG tablet Take 1 tablet (10 mg total) by mouth daily. 90 tablet 3   glipiZIDE (GLUCOTROL XL) 2.5 MG 24 hr tablet Take 1 tablet (2.5 mg total) by mouth daily. 90 tablet 3   lisinopril (ZESTRIL) 10 MG tablet Take 1 tablet (10 mg total) by mouth daily. 90 tablet 3   metFORMIN (GLUCOPHAGE-XR) 500 MG 24 hr tablet Take 1 tablet (500 mg total) by mouth daily. 90 tablet 3   Multiple Vitamins-Minerals (MULTIVITAMIN WITH MINERALS) tablet Take 1 tablet by mouth daily.     nitroGLYCERIN (NITROSTAT) 0.4 MG SL tablet Place 1 tablet (0.4 mg total) under the tongue every 5 (five) minutes as needed for chest pain. 25 tablet 3   OneTouch Delica Lancets 99991111 MISC USE TO TEST TWICE A DAY 100 each 1   pantoprazole (PROTONIX) 40 MG tablet TAKE ONE TABLET BY MOUTH ONCE DAILY (Patient taking differently: Take 40 mg by mouth every other day.) 90 tablet 0   Probiotic Product (ALIGN) 4 MG CAPS Take 1 capsule by mouth daily.     rosuvastatin (CRESTOR) 40 MG tablet Take 1 tablet (40 mg total) by mouth daily. 90 tablet 3   sitaGLIPtin (JANUVIA) 50 MG tablet Take 1 tablet (50 mg total) by mouth daily. 90 tablet 1   No current facility-administered medications on file prior to visit.     Review of Systems  Constitutional:  Negative for activity change, appetite change, fatigue,  fever and unexpected weight change.  HENT:  Negative for congestion, ear pain, rhinorrhea, sinus pressure and sore throat.   Eyes:  Negative for pain, redness and visual disturbance.  Respiratory:  Negative for cough, shortness of breath and wheezing.    Cardiovascular:  Negative for chest pain and palpitations.  Gastrointestinal:  Negative for abdominal pain, blood in stool, constipation and diarrhea.  Endocrine: Negative for polydipsia and polyuria.       Elevated glucose   Genitourinary:  Negative for dysuria, frequency and urgency.  Musculoskeletal:  Positive for arthralgias. Negative for back pain and myalgias.  Skin:  Negative for pallor and rash.  Allergic/Immunologic: Negative for environmental allergies.  Neurological:  Negative for dizziness, syncope and headaches.  Hematological:  Negative for adenopathy. Does not bruise/bleed easily.  Psychiatric/Behavioral:  Negative for decreased concentration and dysphoric mood. The patient is not nervous/anxious.        Objective:   Physical Exam Constitutional:      General: She is not in acute distress.    Appearance: Normal appearance. She is well-developed and normal weight. She is not ill-appearing or diaphoretic.  HENT:     Head: Normocephalic and atraumatic.     Right Ear: Tympanic membrane, ear canal and external ear normal.     Left Ear: Tympanic membrane, ear canal and external ear normal.     Nose: Nose normal. No congestion.     Mouth/Throat:     Mouth: Mucous membranes are moist.     Pharynx: Oropharynx is clear. No posterior oropharyngeal erythema.  Eyes:     General: No scleral icterus.    Extraocular Movements: Extraocular movements intact.     Conjunctiva/sclera: Conjunctivae normal.     Pupils: Pupils are equal, round, and reactive to light.  Neck:     Thyroid: No thyromegaly.     Vascular: No carotid bruit or JVD.  Cardiovascular:     Rate and Rhythm: Regular rhythm. Tachycardia present.     Pulses: Normal pulses.     Heart sounds: Normal heart sounds.     No gallop.  Pulmonary:     Effort: Pulmonary effort is normal. No respiratory distress.     Breath sounds: Normal breath sounds. No wheezing.     Comments: Good air exch Chest:     Chest wall: No  tenderness.  Abdominal:     General: Bowel sounds are normal. There is no distension or abdominal bruit.     Palpations: Abdomen is soft. There is no mass.     Tenderness: There is no abdominal tenderness.     Hernia: No hernia is present.  Genitourinary:    Comments: Breast exam: No mass, nodules, thickening, tenderness, bulging, retraction, inflamation, nipple discharge or skin changes noted.  No axillary or clavicular LA.     Musculoskeletal:        General: No tenderness. Normal range of motion.     Cervical back: Normal range of motion and neck supple. No rigidity. No muscular tenderness.     Right lower leg: No edema.     Left lower leg: No edema.     Comments: No kyphosis   Lymphadenopathy:     Cervical: No cervical adenopathy.  Skin:    General: Skin is warm and dry.     Coloration: Skin is not pale.     Findings: No erythema or rash.     Comments: Solar lentigines diffusely Some sks    Neurological:     Mental Status:  She is alert. Mental status is at baseline.     Cranial Nerves: No cranial nerve deficit.     Motor: No abnormal muscle tone.     Coordination: Coordination normal.     Gait: Gait normal.     Deep Tendon Reflexes: Reflexes are normal and symmetric. Reflexes normal.  Psychiatric:        Mood and Affect: Mood normal.        Cognition and Memory: Cognition and memory normal.           Assessment & Plan:   Problem List Items Addressed This Visit       Cardiovascular and Mediastinum   Atherosclerosis of renal artery (HCC)    Taking statin, zetia Bp controlled Under care of vascular       Coronary artery disease of native artery of native heart with stable angina pectoris ([place])    Rev last cardiology note Stable Continues plavix and carvedilol      Essential hypertension, benign    bp in fair control at this time  BP Readings from Last 1 Encounters:  11/13/22 132/66  No changes needed Most recent labs reviewed  Disc lifstyle change  with low sodium diet and exercise  Under card and vasc care Coreg 3.125 bid Lisinopril 10 mg daily        Relevant Orders   TSH   Lipid panel   Comprehensive metabolic panel   CBC with Differential/Platelet   PAF (paroxysmal atrial fibrillation) (HCC)    No clinical changes Continues cardiology care  Coreg  Plavix       Renal artery stenosis ([place])    Continues vasc f/u        Digestive   GERD (gastroesophageal reflux disease)    Doing well  Pt has started weaning protonix to every other day  Will continue to follow  B12 and D levels today        Endocrine   Hyperlipidemia associated with type 2 diabetes mellitus ([place])    Disc goals for lipids and reasons to control them Rev last labs with pt Rev low sat fat diet in detail crestor 40 mg daily -tolerates well Also zetia 10 mg daily  Lab today      Relevant Orders   Lipid panel   Type 2 diabetes mellitus with stage 2 chronic kidney disease, without long-term current use of insulin (HCC)    Last A1c up at 8.3  Under endo care  May be progressing with age        Musculoskeletal and Integument   Osteoarthritis    Filled out handicapped parking form today      Osteoporosis    Rev dexa 12/2021 in OP range No falls or fractures Will continue to work on strength building exercise  Takes ca/ mag and zinc Inst to add D3 at least 2000 iu daily   Disc tx opt  Has had fx in past  Info on alendronate given Pt will review and reach out when/if she wants to try No dental issues        Genitourinary   Renal insufficiency    Lab today      Relevant Orders   Comprehensive metabolic panel     Other   Anemia   Relevant Orders   CBC with Differential/Platelet   Hypercalcemia    In setting of OP Ca level with lab today Also vit D level      Relevant Orders   Comprehensive metabolic  panel   Routine general medical examination at a health care facility - Primary    Reviewed health habits including diet  and exercise and skin cancer prevention Reviewed appropriate screening tests for age  Also reviewed health mt list, fam hx and immunization status , as well as social and family history   See HPI Labs ordered  Dexa utd 12/2021-disc opt for tx OP/ no new falls or fx  Disc exercise  Mammogram utd 4/2-23 Colonoscoy utd 2017 - out aged repeat  Disc fall prev      Vitamin B12 deficiency    B12 drawn today  Is cutting ppi to every other day  Oral replacement       Relevant Orders   Vitamin B12   Vitamin D deficiency    In setting of OP Level today  Enc her to add to supplement if it does not contain  Takes ppi as well-in process of weaning Tries to get outdoors       Relevant Orders   VITAMIN D 25 Hydroxy (Vit-D Deficiency, Fractures)   Other Visit Diagnoses     Aortic atherosclerosis ([place])   (Chronic)

## 2022-11-13 NOTE — Assessment & Plan Note (Signed)
In setting of OP Ca level with lab today Also vit D level

## 2022-11-13 NOTE — Assessment & Plan Note (Signed)
B12 drawn today  Is cutting ppi to every other day  Oral replacement

## 2022-11-13 NOTE — Assessment & Plan Note (Signed)
Taking statin, zetia Bp controlled Under care of vascular

## 2022-11-13 NOTE — Assessment & Plan Note (Signed)
Doing well  Pt has started weaning protonix to every other day  Will continue to follow  B12 and D levels today

## 2022-11-13 NOTE — Assessment & Plan Note (Signed)
Reviewed health habits including diet and exercise and skin cancer prevention Reviewed appropriate screening tests for age  Also reviewed health mt list, fam hx and immunization status , as well as social and family history   See HPI Labs ordered  Dexa utd 12/2021-disc opt for tx OP/ no new falls or fx  Disc exercise  Mammogram utd 4/2-23 Colonoscoy utd 2017 - out aged repeat  Disc fall prev

## 2022-11-13 NOTE — Assessment & Plan Note (Signed)
Rev dexa 12/2021 in OP range No falls or fractures Will continue to work on strength building exercise  Takes ca/ mag and zinc Inst to add D3 at least 2000 iu daily   Disc tx opt  Has had fx in past  Info on alendronate given Pt will review and reach out when/if she wants to try No dental issues

## 2022-11-13 NOTE — Assessment & Plan Note (Signed)
Continues vasc f/u

## 2022-11-14 ENCOUNTER — Telehealth: Payer: Self-pay | Admitting: Family Medicine

## 2022-11-14 NOTE — Telephone Encounter (Signed)
Pt called returning Joellen's call missed call regarding results. Told pt [facility]'s response, pt had no questions/concerns. Call back # [phone]

## 2022-12-08 NOTE — Progress Notes (Unsigned)
Date:  12/10/2022   ID:  Tracey [name], DOB [birth year], MRN [id number]  Patient Location:  [place] [id number]   Provider location:   [facility], [place] office  PCP:  Abner Greenspan, MD  Cardiologist:  Arvid Right [facility]  Chief Complaint  Patient presents with   6 month follow up     Patient c/o shortness of breath, chest pressure/tightness for the past two weeks. Medications reviewed by the patient verbally.     History of Present Illness:    Tracey [name] is a 87 y.o. female  past medical history of coronary artery disease, bypass surgery in 1995,  diabetes  stent to her graft to the OM several years ago with chest pain January 2013  repeat stenting to the graft to the OM with a Xience 3.5 x 18 mm stent  delivery of a shock for  resuscitation during the procedure),  left renal PCI in 1998, redo PCI March 2010 followed by [name],  History of near syncope spells who presents for routine followup of her coronary artery disease   She  lost her husband in 2016  Last seen by myself in clinic September 2023  Lots of stress at home, church  Couple weeks of Having chest pain, pressure "Most of the morning", last Friday She is concerned for angina  Lab work reviewed A1C 8   Reports blood pressure typically well-controlled at home, elevated on today's visit  Tries to stay active, no regular exercise program  chronic back pain,   Episodes of palpitations, lasting less than a minute  Echo in 2020: normal EF  EKG personally reviewed by myself on todays visit sinus bradycardia rate 55 bpm low voltage  Other past medical history reviewed Prior history of near syncope/syncope episodes Several falls 1 episode reports she was in her Backyard, had a "quick dizzy spell", fell face down  Another episode, 6 Am, went to the bathroom, was walking back to bed, had a "flash spell", fell, broke hand  Monday, "flash" spell, felt very  dizzy Denies vertigo  Followed by [name] 08/25/2020, vascular procedure reviewed and discussed with her in detail Atherosclerotic occlusive disease bilateral lower extremities with rest pain and lifestyle limiting claudication; recurrent left renal artery stenosis; renovascular hypertension  Percutaneous transluminal angioplasty and stent placement pararenal aorta   Percutaneous transluminal angioplasty and stent placement left renal artery  Carotid Carotid artery ultrasound showed 1-39% RICA stenosis and 123456 LICA stenosis.   Echo: Normal EF 65% RVSP 32 Mild to moderate MR   Past Medical History:  Diagnosis Date   Anxiety    Arthritis    Atrial fibrillation (HCC)    history   Chronic kidney disease    ckd stage II   Coronary artery disease    Diabetes mellitus    non insulin dependent   Dysrhythmia    atrial fibrillation, history of   GERD (gastroesophageal reflux disease)    Heart murmur    History of kidney stones    Hyperlipidemia    Hypertension    Peripheral vascular disease ([place])    Rectocele    Syncope 09/2019   UTI (urinary tract infection)    Past Surgical History:  Procedure Laterality Date   ABDOMINAL AORTA STENT     APPENDECTOMY     BREAST BIOPSY Left    20 years ago   BREAST BIOPSY Right 2008   BREAST EXCISIONAL BIOPSY  rt 2008 lt "20 years ago"   CARDIAC CATHETERIZATION     CATARACT EXTRACTION W/PHACO Left 08/15/2015   Procedure: CATARACT EXTRACTION PHACO AND INTRAOCULAR LENS PLACEMENT (IOC);  Surgeon: Estill Cotta, MD;  Location: [facility] ORS;  Service: Ophthalmology;  Laterality: Left;  [place] 01:28 AP% 26.4 CDE 35.73 fluid pack # [id number]   COLONOSCOPY WITH PROPOFOL N/A 01/16/2016   Procedure: COLONOSCOPY WITH PROPOFOL;  Surgeon: Manya Silvas, MD;  Location: [facility] ENDOSCOPY;  Service: Endoscopy;  Laterality: N/A;   CORONARY ANGIOPLASTY WITH STENT PLACEMENT     CORONARY ARTERY BYPASS GRAFT  1995   with LIMA  to the LAD, SVG to OM1, SVG  to PDA   ESOPHAGOGASTRODUODENOSCOPY (EGD) WITH PROPOFOL N/A 01/16/2016   Procedure: ESOPHAGOGASTRODUODENOSCOPY (EGD) WITH PROPOFOL;  Surgeon: Manya Silvas, MD;  Location: [facility] ENDOSCOPY;  Service: Endoscopy;  Laterality: N/A;   EYE SURGERY     JOINT REPLACEMENT Right    KNEE ARTHROPLASTY Left 01/06/2020   Procedure: COMPUTER ASSISTED TOTAL KNEE ARTHROPLASTY;  Surgeon: Dereck Leep, MD;  Location: [facility] ORS;  Service: Orthopedics;  Laterality: Left;   LOWER EXTREMITY ANGIOGRAPHY N/A 07/08/2019   Procedure: Abdominal Aortagraphy;  Surgeon: Algernon Huxley, MD;  Location: [place] CV LAB;  Service: Cardiovascular;  Laterality: N/A;   RENAL ANGIOGRAPHY Left 08/26/2019   Procedure: RENAL ANGIOGRAPHY;  Surgeon: Algernon Huxley, MD;  Location: [place] CV LAB;  Service: Cardiovascular;  Laterality: Left;   RENAL ARTERY STENT     left, By Dr Hulda Humphrey   REPLACEMENT TOTAL KNEE  03/25/2013   right   TONSILLECTOMY       Current Meds  Medication Sig   ACCU-CHEK GUIDE test strip USE AS INSTRUCTED TO CHECK BLOOD SUGAR ONE TO TWO TIMES A DAY.   acetaminophen (TYLENOL) 500 MG tablet Take 500 mg by mouth in the morning and at bedtime.   carvedilol (COREG) 3.125 MG tablet Take 1 tablet (3.125 mg total) by mouth daily.   clopidogrel (PLAVIX) 75 MG tablet Take 1 tablet (75 mg total) by mouth daily.   CRANBERRY PO Take 1 tablet by mouth daily.   Cyanocobalamin (VITAMIN B-12 IJ) Inject as directed See admin instructions. Every two months   ezetimibe (ZETIA) 10 MG tablet Take 1 tablet (10 mg total) by mouth daily.   glipiZIDE (GLUCOTROL XL) 2.5 MG 24 hr tablet Take 1 tablet (2.5 mg total) by mouth daily.   lisinopril (ZESTRIL) 10 MG tablet Take 1 tablet (10 mg total) by mouth daily.   metFORMIN (GLUCOPHAGE-XR) 500 MG 24 hr tablet Take 1 tablet (500 mg total) by mouth daily.   Multiple Vitamins-Minerals (MULTIVITAMIN WITH MINERALS) tablet Take 1 tablet by mouth daily.   nitroGLYCERIN (NITROSTAT) 0.4 MG SL  tablet Place 1 tablet (0.4 mg total) under the tongue every 5 (five) minutes as needed for chest pain.   OneTouch Delica Lancets [id number] MISC USE TO TEST TWICE A DAY   pantoprazole (PROTONIX) 40 MG tablet TAKE ONE TABLET BY MOUTH ONCE DAILY (Patient taking differently: Take 40 mg by mouth every other day.)   Probiotic Product (ALIGN) 4 MG CAPS Take 1 capsule by mouth daily.   rosuvastatin (CRESTOR) 40 MG tablet Take 1 tablet (40 mg total) by mouth daily.     Allergies:   Codeine, Erythromycin, Tramadol, and Ciprofloxacin   Family Hx: The patient's family history includes Cancer in her brother and daughter; Heart disease in her father; Hyperlipidemia in her daughter; Other in her brother, brother, and  brother. There is no history of Breast cancer, Kidney cancer, or Bladder Cancer.  ROS:   Please see the history of present illness.    Review of Systems  Constitutional: Negative.   HENT: Negative.    Respiratory: Negative.    Cardiovascular: Negative.   Gastrointestinal: Negative.   Musculoskeletal: Negative.   Neurological: Negative.   Psychiatric/Behavioral: Negative.    All other systems reviewed and are negative.    Labs/Other Tests and Data Reviewed:    Recent Labs: 11/13/2022: ALT 19; BUN 24; Creatinine, Ser 1.04; Hemoglobin 12.2; Platelets 165.0; Potassium 4.9; Sodium 139; TSH 1.78   Recent Lipid Panel Lab Results  Component Value Date/Time   CHOL 125 11/13/2022 11:39 AM   CHOL 158 12/12/2020 11:15 AM   CHOL 141 10/08/2011 04:57 AM   TRIG 104.0 11/13/2022 11:39 AM   TRIG 179 10/08/2011 04:57 AM   HDL 49.20 11/13/2022 11:39 AM   HDL 52 12/12/2020 11:15 AM   HDL 37 (L) 10/08/2011 04:57 AM   CHOLHDL 3 11/13/2022 11:39 AM   LDLCALC 55 11/13/2022 11:39 AM   LDLCALC 70 04/24/2021 10:43 AM   LDLCALC 68 10/08/2011 04:57 AM    Wt Readings from Last 3 Encounters:  12/10/22 131 lb 6 oz (59.6 kg)  11/13/22 129 lb 4 oz (58.6 kg)  09/20/22 129 lb 3.2 oz (58.6 kg)     Exam:     BP (!) 152/60 (BP Location: Left Arm, Patient Position: Sitting, Cuff Size: Normal)   Pulse (!) 55   Ht 5\' 3"  (1.6 m)   Wt 131 lb 6 oz (59.6 kg)   SpO2 98%   BMI 23.27 kg/m  Constitutional:  oriented to person, place, and time. No distress.  HENT:  Head: Grossly normal Eyes:  no discharge. No scleral icterus.  Neck: No JVD, no carotid bruits  Cardiovascular: Regular rate and rhythm, no murmurs appreciated Pulmonary/Chest: Clear to auscultation bilaterally, no wheezes or rails Abdominal: Soft.  no distension.  no tenderness.  Musculoskeletal: Normal range of motion Neurological:  normal muscle tone. Coordination normal. No atrophy Skin: Skin warm and dry Psychiatric: normal affect, pleasant   ASSESSMENT & PLAN:    Coronary artery disease with stable angina, history of CABG Recent chest pain symptoms but going through lots of stress at church Pharmacological Myoview ordered to rule out ischemia We will start isosorbide 15 mg daily with slow titration up to 30 mg daily for chronic stable angina  Supraventricular tachycardia Rare short episodes tachycardia Minimally symptomatic, lasting less than a minute, continue low-dose Coreg 3.125 daily.  Low-dose setting of bradycardia  Sinus bradycardia Continue Coreg 3.125 daily, asymptomatic  Near syncope, syncope, unspecified syncope type Prior history of near syncope, etiology unclear, ZIO monitor performed previously Triggered events not associated with significant arrhythmia No recurrent symptoms on low-dose carvedilol Recommend continued hydration  PAD (peripheral artery disease) (HCC) Significant aortic disease, renal artery disease Prior intervention by [name],  Continue aspirin Plavix, statin, cholesterol at goal  Hyperlipidemia Continue Crestor Zetia, cholesterol at goal  Diabetes type 2 with complications Recent climbing her A1c, working with primary care and endocrine to get numbers down    Total encounter  time more than 30 minutes  Greater than 50% was spent in counseling and coordination of care with the patient   Signed, Ida Rogue, MD  12/10/2022 12:24 PM    [facility] [address], [place], [place] [address]

## 2022-12-10 ENCOUNTER — Encounter: Payer: Self-pay | Admitting: Cardiovascular Disease

## 2022-12-10 ENCOUNTER — Ambulatory Visit: Payer: Medicare HMO | Attending: Cardiovascular Disease | Admitting: Cardiovascular Disease

## 2022-12-10 VITALS — BP 132/80 | HR 55 | Ht 63.0 in | Wt 131.4 lb

## 2022-12-10 DIAGNOSIS — R079 Chest pain, unspecified: Secondary | ICD-10-CM

## 2022-12-10 DIAGNOSIS — R55 Syncope and collapse: Secondary | ICD-10-CM | POA: Diagnosis not present

## 2022-12-10 DIAGNOSIS — E0800 Diabetes mellitus due to underlying condition with hyperosmolarity without nonketotic hyperglycemic-hyperosmolar coma (NKHHC): Secondary | ICD-10-CM

## 2022-12-10 DIAGNOSIS — I739 Peripheral vascular disease, unspecified: Secondary | ICD-10-CM | POA: Diagnosis not present

## 2022-12-10 DIAGNOSIS — I700 Atherosclerosis of aorta: Secondary | ICD-10-CM

## 2022-12-10 DIAGNOSIS — I701 Atherosclerosis of renal artery: Secondary | ICD-10-CM | POA: Diagnosis not present

## 2022-12-10 DIAGNOSIS — I10 Essential (primary) hypertension: Secondary | ICD-10-CM

## 2022-12-10 DIAGNOSIS — R002 Palpitations: Secondary | ICD-10-CM

## 2022-12-10 DIAGNOSIS — I25118 Atherosclerotic heart disease of native coronary artery with other forms of angina pectoris: Secondary | ICD-10-CM | POA: Diagnosis not present

## 2022-12-10 DIAGNOSIS — I6522 Occlusion and stenosis of left carotid artery: Secondary | ICD-10-CM

## 2022-12-10 DIAGNOSIS — I471 Supraventricular tachycardia, unspecified: Secondary | ICD-10-CM

## 2022-12-10 DIAGNOSIS — I6523 Occlusion and stenosis of bilateral carotid arteries: Secondary | ICD-10-CM

## 2022-12-10 MED ORDER — ISOSORBIDE MONONITRATE ER 30 MG PO TB24: 30.0000 mg | ORAL_TABLET | Freq: Every day | ORAL | 3 refills | Status: DC

## 2022-12-10 NOTE — Addendum Note (Signed)
Addended by: Minna Merritts on: 12/10/2022 12:31 PM   Modules accepted: Orders

## 2022-12-10 NOTE — Patient Instructions (Addendum)
   Medication Instructions:  Please start imdur 30 mg daily To start, take 1/2 pill, monitor blood pressure   If you need a refill on your cardiac medications before your next appointment, please call your pharmacy.   Lab work: No new labs needed  Testing/Procedures: Lexicographer for CAD, angina  Your provider has ordered a Lexiscan/ Exercise Myoview Stress test. This will take place at [facility]. Please report to the [facility] medical mall entrance. The volunteers at the first desk will direct you where to go.  [place]  Your provider has ordered a Stress Test with nuclear imaging. The purpose of this test is to evaluate the blood supply to your heart muscle. This procedure is referred to as a "Non-Invasive Stress Test." This is because other than having an IV started in your vein, nothing is inserted or "invades" your body. Cardiac stress tests are done to find areas of poor blood flow to the heart by determining the extent of coronary artery disease (CAD). Some patients exercise on a treadmill, which naturally increases the blood flow to your heart, while others who are unable to walk on a treadmill due to physical limitations will have a pharmacologic/chemical stress agent called Lexiscan . This medicine will mimic walking on a treadmill by temporarily increasing your coronary blood flow.   Please note: these test may take anywhere between 2-4 hours to complete  How to prepare for your Myoview test:  Nothing to eat for 6 hours prior to the test No caffeine for 24 hours prior to test No smoking 24 hours prior to test. Your medication may be taken with water.  If your doctor stopped a medication because of this test, do not take that medication. Ladies, please do not wear dresses.  Skirts or pants are appropriate. Please wear a short sleeve shirt. No perfume, cologne or lotion. Wear comfortable walking shoes. No heels!   PLEASE NOTIFY THE OFFICE AT LEAST 40 HOURS IN ADVANCE IF YOU ARE  UNABLE TO KEEP YOUR APPOINTMENT.  [phone] AND  PLEASE NOTIFY NUCLEAR MEDICINE AT [facility] AT LEAST 24 HOURS IN ADVANCE IF YOU ARE UNABLE TO KEEP YOUR APPOINTMENT. [phone]   Follow-Up: At [facility], you and your health needs are our priority.  As part of our continuing mission to provide you with exceptional heart care, we have created designated Provider Care Teams.  These Care Teams include your primary Cardiologist (physician) and Advanced Practice Providers (APPs -  Physician Assistants and Nurse Practitioners) who all work together to provide you with the care you need, when you need it.  You will need a follow up appointment in 6 months  Providers on your designated Care Team:   Murray Hodgkins, NP Christell Faith, PA-C Cadence Kathlen Mody, [place]  COVID-19 Vaccine Information can be found at: [email or web address].uk For questions related to vaccine distribution or appointments, please email [email or web address] or call [phone].

## 2022-12-11 ENCOUNTER — Telehealth: Payer: Self-pay | Admitting: Family Medicine

## 2022-12-11 DIAGNOSIS — E559 Vitamin D deficiency, unspecified: Secondary | ICD-10-CM

## 2022-12-11 NOTE — Telephone Encounter (Signed)
-----   Message from Ellamae Sia sent at 11/27/2022 10:53 AM EDT ----- Regarding: Lab orders for Thursday, 3.28.24 Lab orders, thanks

## 2022-12-13 ENCOUNTER — Other Ambulatory Visit (INDEPENDENT_AMBULATORY_CARE_PROVIDER_SITE_OTHER): Payer: Medicare HMO

## 2022-12-13 DIAGNOSIS — E559 Vitamin D deficiency, unspecified: Secondary | ICD-10-CM

## 2022-12-13 LAB — VITAMIN D 25 HYDROXY (VIT D DEFICIENCY, FRACTURES): VITD: 58.18 ng/mL (ref 30.00–100.00)

## 2022-12-14 LAB — PTH, INTACT AND CALCIUM
Calcium: 9.6 mg/dL (ref 8.6–10.4)
PTH: 68 pg/mL (ref 16–77)

## 2022-12-17 ENCOUNTER — Encounter
Admission: RE | Admit: 2022-12-17 | Discharge: 2022-12-17 | Disposition: A | Discharge status: Home / Self Care | Payer: Medicare HMO | Source: Ambulatory Visit | Attending: Cardiovascular Disease | Admitting: Cardiovascular Disease

## 2022-12-17 DIAGNOSIS — R079 Chest pain, unspecified: Secondary | ICD-10-CM | POA: Diagnosis not present

## 2022-12-17 LAB — NM MYOCAR MULTI W/SPECT W/WALL MOTION / EF
LV dias vol: 70 mL (ref 46–106)
LV sys vol: 27 mL
Nuc Stress EF: 61 %
Peak HR: 96 {beats}/min
Percent HR: 70 %
Rest HR: 55 {beats}/min
Rest Nuclear Isotope Dose: 10.1 mCi
SDS: 1
SRS: 7
SSS: 7
ST Depression (mm): 0 mm
Stress Nuclear Isotope Dose: 30.2 mCi
TID: 1

## 2022-12-17 MED ORDER — TECHNETIUM TC 99M TETROFOSMIN IV KIT
10.0000 | PACK | Freq: Once | INTRAVENOUS | Status: AC | PRN
  Administered 2022-12-17: 10.09 via INTRAVENOUS

## 2022-12-17 MED ORDER — TECHNETIUM TC 99M TETROFOSMIN IV KIT
30.2400 | PACK | Freq: Once | INTRAVENOUS | Status: AC | PRN
  Administered 2022-12-17: 30.24 via INTRAVENOUS

## 2022-12-17 MED ORDER — REGADENOSON 0.4 MG/5ML IV SOLN
0.4000 mg | Freq: Once | INTRAVENOUS | Status: AC
  Administered 2022-12-17: 0.4 mg via INTRAVENOUS

## 2022-12-18 ENCOUNTER — Other Ambulatory Visit (INDEPENDENT_AMBULATORY_CARE_PROVIDER_SITE_OTHER): Payer: Self-pay | Admitting: Nurse Practitioner

## 2022-12-18 DIAGNOSIS — I701 Atherosclerosis of renal artery: Secondary | ICD-10-CM

## 2022-12-18 DIAGNOSIS — I739 Peripheral vascular disease, unspecified: Secondary | ICD-10-CM

## 2022-12-20 ENCOUNTER — Encounter: Payer: Self-pay | Admitting: Cardiovascular Disease

## 2022-12-20 ENCOUNTER — Ambulatory Visit (INDEPENDENT_AMBULATORY_CARE_PROVIDER_SITE_OTHER): Payer: Medicare HMO | Admitting: Nurse Practitioner

## 2022-12-20 ENCOUNTER — Encounter: Payer: Self-pay | Admitting: Nurse Practitioner

## 2022-12-20 VITALS — BP 124/70 | HR 61 | Ht 63.0 in | Wt 131.0 lb

## 2022-12-20 DIAGNOSIS — E1122 Type 2 diabetes mellitus with diabetic chronic kidney disease: Secondary | ICD-10-CM

## 2022-12-20 DIAGNOSIS — N182 Chronic kidney disease, stage 2 (mild): Secondary | ICD-10-CM | POA: Diagnosis not present

## 2022-12-20 LAB — POCT GLYCOSYLATED HEMOGLOBIN (HGB A1C): Hemoglobin A1C: 8.5 % — AB (ref 4.0–5.6)

## 2022-12-20 MED ORDER — METFORMIN HCL ER 500 MG PO TB24
500.0000 mg | ORAL_TABLET | Freq: Every day | ORAL | 3 refills | Status: DC
Start: 2022-12-20 — End: 2024-01-31

## 2022-12-20 MED ORDER — ONETOUCH VERIO VI STRP: ORAL_STRIP | 3 refills | Status: DC

## 2022-12-20 MED ORDER — GLIPIZIDE ER 2.5 MG PO TB24
2.5000 mg | ORAL_TABLET | Freq: Every day | ORAL | 3 refills | Status: DC
Start: 2022-12-20 — End: 2023-04-01

## 2022-12-20 NOTE — Progress Notes (Signed)
12/20/2022, 2:37 PM  Endocrinology follow-up note   Subjective:    Patient ID: Tracey [name], female    DOB: 29-Oct-1935.  Tracey [name] is being seen  in follow-up after she was seen in consultation for management of currently uncontrolled symptomatic diabetes requested by  Abner Greenspan, MD.   Past Medical History:  Diagnosis Date   Anxiety    Arthritis    Atrial fibrillation    history   Chronic kidney disease    ckd stage II   Coronary artery disease    Diabetes mellitus    non insulin dependent   Dysrhythmia    atrial fibrillation, history of   GERD (gastroesophageal reflux disease)    Heart murmur    History of kidney stones    Hyperlipidemia    Hypertension    Peripheral vascular disease    Rectocele    Syncope 09/2019   UTI (urinary tract infection)     Past Surgical History:  Procedure Laterality Date   ABDOMINAL AORTA STENT     APPENDECTOMY     BREAST BIOPSY Left    20 years ago   BREAST BIOPSY Right 2008   BREAST EXCISIONAL BIOPSY     rt 2008 lt "20 years ago"   CARDIAC CATHETERIZATION     CATARACT EXTRACTION W/PHACO Left 08/15/2015   Procedure: CATARACT EXTRACTION PHACO AND INTRAOCULAR LENS PLACEMENT ([place]);  Surgeon: Estill Cotta, MD;  Location: [facility] ORS;  Service: Ophthalmology;  Laterality: Left;  [place] 01:28 AP% 26.4 CDE 35.73 fluid pack # [id number]   COLONOSCOPY WITH PROPOFOL N/A 01/16/2016   Procedure: COLONOSCOPY WITH PROPOFOL;  Surgeon: Manya Silvas, MD;  Location: [facility] ENDOSCOPY;  Service: Endoscopy;  Laterality: N/A;   CORONARY ANGIOPLASTY WITH STENT PLACEMENT     CORONARY ARTERY BYPASS GRAFT  1995   with LIMA  to the LAD, SVG to OM1, SVG to PDA   ESOPHAGOGASTRODUODENOSCOPY (EGD) WITH PROPOFOL N/A 01/16/2016   Procedure: ESOPHAGOGASTRODUODENOSCOPY (EGD) WITH PROPOFOL;  Surgeon: Manya Silvas, MD;  Location: [facility] ENDOSCOPY;  Service: Endoscopy;   Laterality: N/A;   EYE SURGERY     JOINT REPLACEMENT Right    KNEE ARTHROPLASTY Left 01/06/2020   Procedure: COMPUTER ASSISTED TOTAL KNEE ARTHROPLASTY;  Surgeon: Dereck Leep, MD;  Location: [facility] ORS;  Service: Orthopedics;  Laterality: Left;   LOWER EXTREMITY ANGIOGRAPHY N/A 07/08/2019   Procedure: Abdominal Aortagraphy;  Surgeon: Algernon Huxley, MD;  Location: [facility];  Service: Cardiovascular;  Laterality: N/A;   RENAL ANGIOGRAPHY Left 08/26/2019   Procedure: RENAL ANGIOGRAPHY;  Surgeon: Algernon Huxley, MD;  Location: [facility];  Service: Cardiovascular;  Laterality: Left;   RENAL ARTERY STENT     left, By Dr Hulda Humphrey   REPLACEMENT TOTAL KNEE  03/25/2013   right   TONSILLECTOMY      Social History   Socioeconomic History   Marital status: Married    Spouse name: Not on file   Number of children: Not on file   Years of education: Not on file   Highest education level: Not on file  Occupational History   Occupation: Gaffer    Comment: retired  Tobacco Use   Smoking status: Never   Smokeless tobacco: Never  Vaping Use   Vaping Use: Never used  Substance and Sexual Activity   Alcohol use: No   Drug use: No   Sexual activity: Never  Other Topics Concern   Not on file  Social History Narrative   Patient has 3 daughters. Remarried June 2021.   Social Determinants of Health   Financial Resource Strain: Low Risk  (09/05/2022)   Overall Financial Resource Strain (CARDIA)    Difficulty of Paying Living Expenses: Not hard at all  Food Insecurity: No Food Insecurity (09/05/2022)   Hunger Vital Sign    Worried About Running Out of Food in the Last Year: Never true    Ran Out of Food in the Last Year: Never true  Transportation Needs: No Transportation Needs (09/05/2022)   PRAPARE - Hydrologist (Medical): No    Lack of Transportation (Non-Medical): No  Physical Activity: Insufficiently Active (09/05/2022)   Exercise  Vital Sign    Days of Exercise per Week: 7 days    Minutes of Exercise per Session: 10 min  Stress: No Stress Concern Present (09/05/2022)   [place]    Feeling of Stress : Not at all  Social Connections: [place] (09/05/2022)   Social Connection and Isolation Panel [NHANES]    Frequency of Communication with Friends and Family: More than three times a week    Frequency of Social Gatherings with Friends and Family: More than three times a week    Attends Religious Services: More than 4 times per year    Active Member of [employer] or Organizations: Yes    Attends Music therapist: More than 4 times per year    Marital Status: Married    Family History  Problem Relation Age of Onset   Heart disease Father    Other Brother        open heart surgery   Cancer Brother    Other Brother        open heart surgery   Other Brother        open heart surgery   Cancer Daughter    Hyperlipidemia Daughter    Breast cancer Neg Hx    Kidney cancer Neg Hx    Bladder Cancer Neg Hx     Outpatient Encounter Medications as of 12/20/2022  Medication Sig   acetaminophen (TYLENOL) 500 MG tablet Take 500 mg by mouth in the morning and at bedtime.   carvedilol (COREG) 3.125 MG tablet Take 1 tablet (3.125 mg total) by mouth daily.   clopidogrel (PLAVIX) 75 MG tablet Take 1 tablet (75 mg total) by mouth daily.   CRANBERRY PO Take 1 tablet by mouth daily.   Cyanocobalamin (VITAMIN B-12 IJ) Inject as directed See admin instructions. Every two months   ezetimibe (ZETIA) 10 MG tablet Take 1 tablet (10 mg total) by mouth daily.   glucose blood (ONETOUCH VERIO) test strip Use as instructed to monitor glucose twice daily   isosorbide mononitrate (IMDUR) 30 MG 24 hr tablet Take 1 tablet (30 mg total) by mouth daily.   lisinopril (ZESTRIL) 10 MG tablet Take 1 tablet (10 mg total) by mouth daily.   Multiple Vitamins-Minerals  (MULTIVITAMIN WITH MINERALS) tablet Take 1 tablet by mouth daily.   nitroGLYCERIN (NITROSTAT) 0.4 MG SL tablet Place  1 tablet (0.4 mg total) under the tongue every 5 (five) minutes as needed for chest pain.   OneTouch Delica Lancets [id number] MISC USE TO TEST TWICE A DAY   pantoprazole (PROTONIX) 40 MG tablet TAKE ONE TABLET BY MOUTH ONCE DAILY (Patient taking differently: Take 40 mg by mouth every other day.)   Probiotic Product (ALIGN) 4 MG CAPS Take 1 capsule by mouth daily.   rosuvastatin (CRESTOR) 40 MG tablet Take 1 tablet (40 mg total) by mouth daily.   [DISCONTINUED] ACCU-CHEK GUIDE test strip USE AS INSTRUCTED TO CHECK BLOOD SUGAR ONE TO TWO TIMES A DAY.   [DISCONTINUED] glipiZIDE (GLUCOTROL XL) 2.5 MG 24 hr tablet Take 1 tablet (2.5 mg total) by mouth daily.   [DISCONTINUED] metFORMIN (GLUCOPHAGE-XR) 500 MG 24 hr tablet Take 1 tablet (500 mg total) by mouth daily.   glipiZIDE (GLUCOTROL XL) 2.5 MG 24 hr tablet Take 1 tablet (2.5 mg total) by mouth daily.   metFORMIN (GLUCOPHAGE-XR) 500 MG 24 hr tablet Take 1 tablet (500 mg total) by mouth daily.   [DISCONTINUED] sitaGLIPtin (JANUVIA) 50 MG tablet Take 1 tablet (50 mg total) by mouth daily. (Patient not taking: Reported on 12/10/2022)   No facility-administered encounter medications on file as of 12/20/2022.    ALLERGIES: Allergies  Allergen Reactions   Codeine Nausea Only    Sick to her stomach   Erythromycin Nausea And Vomiting and Other (See Comments)   Tramadol Nausea Only and Other (See Comments)    Loss of appetite and did not eat   Ciprofloxacin Diarrhea    Upsets stomach    VACCINATION STATUS: Immunization History  Administered Date(s) Administered   Fluad Quad(high Dose 65+) 06/15/2019, 09/26/2020   Influenza Split 08/10/2015   Influenza, High Dose Seasonal PF 11/05/2022   Influenza,inj,Quad PF,6+ Mos 05/24/2016, 07/26/2017, 06/03/2018, 07/11/2021   Influenza-Unspecified 08/10/2015, 05/24/2016   Moderna Sars-Covid-2  Vaccination 10/02/2019, 10/30/2019, 08/01/2020   Pneumococcal Conjugate-13 11/05/2016   Pneumococcal Polysaccharide-23 12/11/2017   Td 03/17/2013    Diabetes She presents for her follow-up diabetic visit. She has type 2 diabetes mellitus. Onset time: She was diagnosed at approximate age of 55 years. Her disease course has been worsening. There are no hypoglycemic associated symptoms. Pertinent negatives for hypoglycemia include no confusion, headaches, pallor or seizures. Associated symptoms include blurred vision (especially in the evenings) and fatigue. Pertinent negatives for diabetes include no chest pain, no polydipsia, no polyphagia and no polyuria. There are no hypoglycemic complications. Symptoms are stable. Diabetic complications include heart disease, nephropathy and PVD. Risk factors for coronary artery disease include dyslipidemia, diabetes mellitus, hypertension, sedentary lifestyle and post-menopausal. Current diabetic treatment includes oral agent (dual therapy). She is compliant with treatment all of the time. Her weight is fluctuating minimally. She is following a generally healthy diet. Meal planning includes avoidance of concentrated sweets and ADA exchanges. She rarely participates in exercise. Her home blood glucose trend is fluctuating minimally. Her breakfast blood glucose range is generally 140-180 mg/dl. (She presents today with her meter and logs showing slightly above target fasting glycemic profile.  Her POCT A1c today is 8.5%, increasing slightly from last visit of 8.3%.  She notes she recently underwent cardiac stress test and still does not feel well.  Says she is lightheaded, dizzy at times, and HR is elevated more than usual (usually mildly bradycardic).  She did not pick up the Januvia as it was too expensive.  She does report symptoms of hypoglycemia at time, but did not check it,  just corrected with peanut butter.  She admits she has not been as active lately either.) An  ACE inhibitor/angiotensin II receptor blocker is being taken. She does not see a podiatrist.Eye exam is current.  Hyperlipidemia This is a chronic problem. The current episode started more than 1 year ago. The problem is controlled. Recent lipid tests were reviewed and are normal. Exacerbating diseases include chronic renal disease and diabetes. Factors aggravating her hyperlipidemia include beta blockers. Pertinent negatives include no chest pain, myalgias or shortness of breath. Current antihyperlipidemic treatment includes statins. The current treatment provides moderate improvement of lipids. There are no compliance problems.  Risk factors for coronary artery disease include dyslipidemia, diabetes mellitus, hypertension, post-menopausal and a sedentary lifestyle.  Hypertension This is a chronic problem. The current episode started more than 1 year ago. The problem has been gradually improving since onset. The problem is controlled. Associated symptoms include blurred vision (especially in the evenings). Pertinent negatives include no chest pain, headaches, palpitations or shortness of breath. There are no associated agents to hypertension. Risk factors for coronary artery disease include diabetes mellitus, dyslipidemia, sedentary lifestyle and post-menopausal state. Past treatments include beta blockers and ACE inhibitors. The current treatment provides moderate improvement. There are no compliance problems.  Hypertensive end-organ damage includes kidney disease, CAD/MI and PVD. Identifiable causes of hypertension include chronic renal disease.    Review of systems  Constitutional: + Minimally fluctuating body weight,  current Body mass index is 23.21 kg/m. , + fatigue, no subjective hyperthermia, no subjective hypothermia Eyes: no blurry vision, no xerophthalmia ENT: no sore throat, no nodules palpated in throat, no dysphagia/odynophagia, no hoarseness Cardiovascular: no chest pain, no shortness of  breath, no palpitations, no leg swelling Respiratory: no cough, no shortness of breath Gastrointestinal: no nausea/vomiting/diarrhea Musculoskeletal: no muscle/joint aches Skin: no rashes, no hyperemia Neurological: no tremors, no numbness, no tingling, + dizziness since stress test Psychiatric: no depression, no anxiety   Objective:    BP 124/70 (BP Location: Left Arm, Patient Position: Sitting, Cuff Size: Normal)   Pulse 61   Ht 5\' 3"  (1.6 m)   Wt 131 lb (59.4 kg)   BMI 23.21 kg/m   Wt Readings from Last 3 Encounters:  12/20/22 131 lb (59.4 kg)  12/10/22 131 lb 6 oz (59.6 kg)  11/13/22 129 lb 4 oz (58.6 kg)    BP Readings from Last 3 Encounters:  12/20/22 124/70  12/10/22 132/80  11/13/22 132/66     Physical Exam- Limited  Constitutional:  Body mass index is 23.21 kg/m. , not in acute distress, normal state of mind Eyes:  EOMI, no exophthalmos Musculoskeletal: no gross deformities, strength intact in all four extremities, no gross restriction of joint movements Skin:  no rashes, no hyperemia Neurological: no tremor with outstretched hands    CMP ( most recent) CMP     Component Value Date/Time   NA 139 11/13/2022 1139   NA 137 03/27/2013 0545   K 4.9 11/13/2022 1139   K 3.9 03/27/2013 0545   CL 104 11/13/2022 1139   CL 103 03/27/2013 0545   CO2 28 11/13/2022 1139   CO2 27 03/27/2013 0545   GLUCOSE 258 (H) 11/13/2022 1139   GLUCOSE 122 (H) 03/27/2013 0545   BUN 24 (H) 11/13/2022 1139   BUN 10 03/27/2013 0545   CREATININE 1.04 11/13/2022 1139   CREATININE 0.99 (H) 04/24/2021 1043   CALCIUM 9.6 12/13/2022 0908   CALCIUM 8.9 03/27/2013 0545   PROT 6.4 11/13/2022 1139  PROT 7.6 10/07/2011 1316   ALBUMIN 4.2 11/13/2022 1139   ALBUMIN 4.5 10/07/2011 1316   AST 26 11/13/2022 1139   AST 26 10/07/2011 1316   ALT 19 11/13/2022 1139   ALT 26 10/07/2011 1316   ALKPHOS 59 11/13/2022 1139   ALKPHOS 46 (L) 10/07/2011 1316   BILITOT 0.4 11/13/2022 1139    BILITOT 0.5 10/07/2011 1316   GFRNONAA 54 (L) 03/03/2020 0000   GFRAA 63 03/03/2020 0000     Diabetic Labs (most recent): Lab Results  Component Value Date   HGBA1C 8.5 (A) 12/20/2022   HGBA1C 8.3 (A) 09/20/2022   HGBA1C 8.0 03/14/2022   MICROALBUR 30 04/26/2021   MICROALBUR 1.9 03/03/2020     Lipid Panel ( most recent) Lipid Panel     Component Value Date/Time   CHOL 125 11/13/2022 1139   CHOL 158 12/12/2020 1115   CHOL 141 10/08/2011 0457   TRIG 104.0 11/13/2022 1139   TRIG 179 10/08/2011 0457   HDL 49.20 11/13/2022 1139   HDL 52 12/12/2020 1115   HDL 37 (L) 10/08/2011 0457   CHOLHDL 3 11/13/2022 1139   VLDL 20.8 11/13/2022 1139   VLDL 36 10/08/2011 0457   LDLCALC 55 11/13/2022 1139   LDLCALC 70 04/24/2021 1043   LDLCALC 68 10/08/2011 0457      Lab Results  Component Value Date   TSH 1.78 11/13/2022   TSH 2.44 11/01/2021   TSH 1.94 06/07/2021   TSH 1.99 04/24/2021   TSH 1.66 03/03/2020   TSH 2.68 03/05/2019   TSH 2.96 11/06/2017   TSH 1.24 02/01/2017   TSH 1.86 02/06/2016   FREET4 1.4 04/24/2021   FREET4 1.4 03/03/2020      Assessment & Plan:   1) Type 2 diabetes complicated by stage 2 CKD  - Tracey [name] has currently controlled asymptomatic type 2 DM since  87 years of age.  She presents today with her meter and logs showing slightly above target fasting glycemic profile.  Her POCT A1c today is 8.5%, increasing slightly from last visit of 8.3%.  She notes she recently underwent cardiac stress test and still does not feel well.  Says she is lightheaded, dizzy at times, and HR is elevated more than usual (usually mildly bradycardic).  She did not pick up the Januvia as it was too expensive.  She does report symptoms of hypoglycemia at time, but did not check it, just corrected with peanut butter.  She admits she has not been as active lately either.  - I had a long discussion with her about the progressive nature of diabetes and the pathology behind  its complications. -her diabetes is complicated by stage 2 CKD and she remains at a high risk for more acute and chronic complications which include CAD, CVA, CKD, retinopathy, and neuropathy. These are all discussed in detail with her.  The following Lifestyle Medicine recommendations according to [facility]) were discussed and offered to patient and she agrees to start the journey:  A. Whole Foods, Plant-based plate comprising of fruits and vegetables, plant-based proteins, whole-grain carbohydrates was discussed in detail with the patient.   A list for source of those nutrients were also provided to the patient.  Patient will use only water or unsweetened tea for hydration. B.  The need to stay away from risky substances including alcohol, smoking; obtaining 7 to 9 hours of restorative sleep, at least 150 minutes of moderate intensity exercise weekly, the importance of healthy  social connections,  and stress reduction techniques were discussed. C.  A full color page of  Calorie density of various food groups per pound showing examples of each food groups was provided to the patient.  - Nutritional counseling repeated at each appointment due to patients tendency to fall back in to old habits.  - The patient admits there is a room for improvement in their diet and drink choices. -  Suggestion is made for the patient to avoid simple carbohydrates from their diet including Cakes, Sweet Desserts / Pastries, Ice Cream, Soda (diet and regular), Sweet Tea, Candies, Chips, Cookies, Sweet Pastries, Store Bought Juices, Alcohol in Excess of 1-2 drinks a day, Artificial Sweeteners, Coffee Creamer, and "Sugar-free" Products. This will help patient to have stable blood glucose profile and potentially avoid unintended weight gain.   - I encouraged the patient to switch to unprocessed or minimally processed complex starch and increased protein intake (animal or plant source),  fruits, and vegetables.   - Patient is advised to stick to a routine mealtimes to eat 3 meals a day and avoid unnecessary snacks (to snack only to correct hypoglycemia).  - I have approached her with the following individualized plan to manage  her diabetes and patient agrees:   -She is advised to continue Metformin 500 mg ER daily with breakfast and Glipizide 2.5 mg XL PO daily with breakfast for now .  May look to increase Glipizide at next visit if glucose has not corrected.  -She is encouraged to continue monitoring blood glucose at least once daily, before breakfast and call the clinic if she gets readings less than 70 or greater than 200 for 3 tests in a row.  - she is not a candidate for SGLT2 inhibitors or full dose Metformin due to concurrent renal insufficiency.  - she is not a candidate for incretin therapy due to her body habitus.    - Patient specific target  A1c;  LDL, HDL, Triglycerides, and  Waist Circumference were discussed in detail.  2) Blood Pressure /Hypertension:  Her blood pressure is controlled to target.  She is advised to continue Coreg 3.125 mg po twice daily and Lisinopril 10 mg po daily.   3) Lipids/Hyperlipidemia: Her most recent lipid panel from 11/13/22 shows controlled LDL at 55.  She is advised to continue Crestor 40 mg po daily at bedtime.  Side effects and precautions discussed with her.    4)  Weight/Diet: Her Body mass index is 23.21 kg/m.  -She is not a candidate for weight loss.  Exercise, and detailed carbohydrates information provided  -  detailed on discharge instructions.  5) Chronic Care/Health Maintenance: -she is on ACEI/ARB and Statin medications and  is encouraged to initiate and continue to follow up with Ophthalmology, Dentist,  Podiatrist at least yearly or according to recommendations, and advised to   stay away from smoking. I have recommended yearly flu vaccine and pneumonia vaccine at least every 5 years; moderate intensity exercise  for up to 150 minutes weekly; and  sleep for at least 7 hours a day.  - she is advised to maintain close follow up with [facility], Wynelle Fanny, MD for primary care needs, as well as her other providers for optimal and coordinated care.      I spent  41  minutes in the care of the patient today including review of labs from [place], Lipids, Thyroid Function, Hematology (current and previous including abstractions from other facilities); face-to-face time discussing  her blood  glucose readings/logs, discussing hypoglycemia and hyperglycemia episodes and symptoms, medications doses, her options of short and long term treatment based on the latest standards of care / guidelines;  discussion about incorporating lifestyle medicine;  and documenting the encounter. Risk reduction counseling performed per USPSTF guidelines to reduce obesity and cardiovascular risk factors.     Please refer to Patient Instructions for Blood Glucose Monitoring and Insulin/Medications Dosing Guide"  in media tab for additional information. Please  also refer to " Patient Self Inventory" in the Media  tab for reviewed elements of pertinent patient history.  Tracey [name] participated in the discussions, expressed understanding, and voiced agreement with the above plans.  All questions were answered to her satisfaction. she is encouraged to contact clinic should she have any questions or concerns prior to her return visit.    Follow up plan: - Return in about 3 months (around 03/21/2023) for Diabetes F/U with A1c in office, No previsit labs, Bring meter and logs.  Rayetta Pigg, [facility] [address] [place], [place] [address] Phone: [phone] Fax: [phone]  12/20/2022, 2:37 PM

## 2022-12-28 ENCOUNTER — Ambulatory Visit (INDEPENDENT_AMBULATORY_CARE_PROVIDER_SITE_OTHER): Payer: Medicare HMO

## 2022-12-28 ENCOUNTER — Ambulatory Visit (INDEPENDENT_AMBULATORY_CARE_PROVIDER_SITE_OTHER): Payer: BC Managed Care – PPO | Admitting: Vascular Surgery

## 2022-12-28 ENCOUNTER — Encounter (INDEPENDENT_AMBULATORY_CARE_PROVIDER_SITE_OTHER): Payer: Self-pay | Admitting: Vascular Surgery

## 2022-12-28 VITALS — BP 150/55 | HR 56 | Resp 18 | Ht 63.0 in | Wt 129.8 lb

## 2022-12-28 DIAGNOSIS — I701 Atherosclerosis of renal artery: Secondary | ICD-10-CM

## 2022-12-28 DIAGNOSIS — Z9889 Other specified postprocedural states: Secondary | ICD-10-CM | POA: Diagnosis not present

## 2022-12-28 DIAGNOSIS — I739 Peripheral vascular disease, unspecified: Secondary | ICD-10-CM

## 2022-12-28 DIAGNOSIS — N182 Chronic kidney disease, stage 2 (mild): Secondary | ICD-10-CM | POA: Diagnosis not present

## 2022-12-28 DIAGNOSIS — E1122 Type 2 diabetes mellitus with diabetic chronic kidney disease: Secondary | ICD-10-CM | POA: Diagnosis not present

## 2022-12-28 DIAGNOSIS — I10 Essential (primary) hypertension: Secondary | ICD-10-CM

## 2022-12-28 NOTE — Assessment & Plan Note (Signed)
blood pressure control important in reducing the progression of atherosclerotic disease. On appropriate oral medications.  Blood pressure much better after renal intervention.

## 2022-12-28 NOTE — Progress Notes (Signed)
MRN : [id number]  Tracey [name] is a 87 y.o. ([birth year]) female who presents with chief complaint of  Chief Complaint  Patient presents with   Follow-up    Follow up 38yr renal/abi.  Marland Kitchen  History of Present Illness: Patient returns today in follow up of multiple vascular issues.  She is doing well today.  She denies any leg pain with activity, ulceration, or rest pain.  Her ABIs today fall in the normal range with multiphasic waveforms. She is a few years status post left renal artery stent placement for severe hypertension.  This is resulted in marked improvement in her blood pressure.  Duplex today shows no hemodynamically significant stenosis in the right renal artery with a widely patent left renal stent.    Current Outpatient Medications  Medication Sig Dispense Refill   acetaminophen (TYLENOL) 500 MG tablet Take 500 mg by mouth in the morning and at bedtime.     AFLURIA QUADRIVALENT injection Inject 0.5 mLs into the muscle.     carvedilol (COREG) 3.125 MG tablet Take 1 tablet (3.125 mg total) by mouth daily. 90 tablet 3   clopidogrel (PLAVIX) 75 MG tablet Take 1 tablet (75 mg total) by mouth daily. 90 tablet 3   CRANBERRY PO Take 1 tablet by mouth daily.     Cyanocobalamin (VITAMIN B-12 IJ) Inject as directed See admin instructions. Every two months     ezetimibe (ZETIA) 10 MG tablet Take 1 tablet (10 mg total) by mouth daily. 90 tablet 3   glipiZIDE (GLUCOTROL XL) 2.5 MG 24 hr tablet Take 1 tablet (2.5 mg total) by mouth daily. 90 tablet 3   glucose blood (ONETOUCH VERIO) test strip Use as instructed to monitor glucose twice daily 100 each 3   isosorbide mononitrate (IMDUR) 30 MG 24 hr tablet Take 1 tablet (30 mg total) by mouth daily. 90 tablet 3   lisinopril (ZESTRIL) 10 MG tablet Take 1 tablet (10 mg total) by mouth daily. 90 tablet 3   metFORMIN (GLUCOPHAGE-XR) 500 MG 24 hr tablet Take 1 tablet (500 mg total) by mouth daily. 90 tablet 3   Multiple Vitamins-Minerals  (MULTIVITAMIN WITH MINERALS) tablet Take 1 tablet by mouth daily.     nitroGLYCERIN (NITROSTAT) 0.4 MG SL tablet Place 1 tablet (0.4 mg total) under the tongue every 5 (five) minutes as needed for chest pain. 25 tablet 3   OneTouch Delica Lancets 33G MISC USE TO TEST TWICE A DAY 100 each 1   pantoprazole (PROTONIX) 40 MG tablet TAKE ONE TABLET BY MOUTH ONCE DAILY (Patient taking differently: Take 40 mg by mouth every other day.) 90 tablet 0   Probiotic Product (ALIGN) 4 MG CAPS Take 1 capsule by mouth daily.     rosuvastatin (CRESTOR) 40 MG tablet Take 1 tablet (40 mg total) by mouth daily. 90 tablet 3   No current facility-administered medications for this visit.    Past Medical History:  Diagnosis Date   Anxiety    Arthritis    Atrial fibrillation    history   Chronic kidney disease    ckd stage II   Coronary artery disease    Diabetes mellitus    non insulin dependent   Dysrhythmia    atrial fibrillation, history of   GERD (gastroesophageal reflux disease)    Heart murmur    History of kidney stones    Hyperlipidemia    Hypertension    Peripheral vascular disease    Rectocele    Syncope  09/2019   UTI (urinary tract infection)     Past Surgical History:  Procedure Laterality Date   ABDOMINAL AORTA STENT     APPENDECTOMY     BREAST BIOPSY Left    20 years ago   BREAST BIOPSY Right 2008   BREAST EXCISIONAL BIOPSY     rt 2008 lt "20 years ago"   CARDIAC CATHETERIZATION     CATARACT EXTRACTION W/PHACO Left 08/15/2015   Procedure: CATARACT EXTRACTION PHACO AND INTRAOCULAR LENS PLACEMENT (IOC);  Surgeon: Sallee Lange, MD;  Location: [facility] ORS;  Service: Ophthalmology;  Laterality: Left;  [place] 01:28 AP% 26.4 CDE 35.73 fluid pack # [id number]   COLONOSCOPY WITH PROPOFOL N/A 01/16/2016   Procedure: COLONOSCOPY WITH PROPOFOL;  Surgeon: Scot Jun, MD;  Location: [facility] ENDOSCOPY;  Service: Endoscopy;  Laterality: N/A;   CORONARY ANGIOPLASTY WITH STENT PLACEMENT      CORONARY ARTERY BYPASS GRAFT  1995   with LIMA  to the LAD, SVG to OM1, SVG to PDA   ESOPHAGOGASTRODUODENOSCOPY (EGD) WITH PROPOFOL N/A 01/16/2016   Procedure: ESOPHAGOGASTRODUODENOSCOPY (EGD) WITH PROPOFOL;  Surgeon: Scot Jun, MD;  Location: [facility] ENDOSCOPY;  Service: Endoscopy;  Laterality: N/A;   EYE SURGERY     JOINT REPLACEMENT Right    KNEE ARTHROPLASTY Left 01/06/2020   Procedure: COMPUTER ASSISTED TOTAL KNEE ARTHROPLASTY;  Surgeon: Donato Heinz, MD;  Location: [facility] ORS;  Service: Orthopedics;  Laterality: Left;   LOWER EXTREMITY ANGIOGRAPHY N/A 07/08/2019   Procedure: Abdominal Aortagraphy;  Surgeon: Annice Needy, MD;  Location: [facility] INVASIVE CV LAB;  Service: Cardiovascular;  Laterality: N/A;   RENAL ANGIOGRAPHY Left 08/26/2019   Procedure: RENAL ANGIOGRAPHY;  Surgeon: Annice Needy, MD;  Location: [facility] INVASIVE CV LAB;  Service: Cardiovascular;  Laterality: Left;   RENAL ARTERY STENT     left, By Dr Earnestine Leys   REPLACEMENT TOTAL KNEE  03/25/2013   right   TONSILLECTOMY       Social History   Tobacco Use   Smoking status: Never   Smokeless tobacco: Never  Vaping Use   Vaping Use: Never used  Substance Use Topics   Alcohol use: No   Drug use: No      Family History  Problem Relation Age of Onset   Heart disease Father    Other Brother        open heart surgery   Cancer Brother    Other Brother        open heart surgery   Other Brother        open heart surgery   Cancer Daughter    Hyperlipidemia Daughter    Breast cancer Neg Hx    Kidney cancer Neg Hx    Bladder Cancer Neg Hx      Allergies  Allergen Reactions   Codeine Nausea Only    Sick to her stomach   Erythromycin Nausea And Vomiting and Other (See Comments)   Tramadol Nausea Only and Other (See Comments)    Loss of appetite and did not eat   Ciprofloxacin Diarrhea    Upsets stomach     REVIEW OF SYSTEMS (Negative unless checked)  Constitutional: [] Weight loss  [] Fever  [] Chills Cardiac:  [] Chest pain   [] Chest pressure   [x] Palpitations   [] Shortness of breath when laying flat   [] Shortness of breath at rest   [] Shortness of breath with exertion. Vascular:  [] Pain in legs with walking   [] Pain in legs at rest   [] Pain in  legs when laying flat   Claudication   Pain in feet when walking  Pain in feet at rest  Pain in feet when laying flat   History of DVT   Phlebitis   Swelling in legs   Varicose veins   Non-healing ulcers Pulmonary:   Uses home oxygen   Productive cough   Hemoptysis   Wheeze  COPD   Asthma Neurologic:  Dizziness  Blackouts   Seizures   History of stroke   History of TIA  Aphasia   Temporary blindness   Dysphagia   Weakness or numbness in arms   Weakness or numbness in legs Musculoskeletal:  Arthritis   Joint swelling   Joint pain   Low back pain Hematologic:  Easy bruising  Easy bleeding   Hypercoagulable state   Anemic   Gastrointestinal:  Blood in stool   Vomiting blood  Gastroesophageal reflux/heartburn   Abdominal pain Genitourinary:  Chronic kidney disease   Difficult urination  Frequent urination  Burning with urination   Hematuria Skin:  Rashes   Ulcers   Wounds Psychological:  History of anxiety    History of major depression.  Physical Examination  BP (!) 150/55 (BP Location: Left Arm)   Pulse (!) 56   Resp 18   Ht  (1.6 m)   Wt 129 lb 12.8 oz (58.9 kg)   BMI 22.99 kg/m  Gen:  WD/WN, NAD. Appears younger than stated age. Head: Green/AT, No temporalis wasting. Ear/Nose/Throat: Hearing grossly intact, nares w/o erythema or drainage Eyes: Conjunctiva clear. Sclera non-icteric Neck: Supple.  Trachea midline Pulmonary:  Good air movement, no use of accessory muscles.  Cardiac: RRR, no JVD Vascular:  Vessel Right Left  Radial Palpable Palpable                          PT Palpable Palpable  DP Palpable Palpable   Gastrointestinal: soft,  non-tender/non-distended. No guarding/reflex.  Musculoskeletal: M/S 5/5 throughout.  No deformity or atrophy. No edema. Neurologic: Sensation grossly intact in extremities.  Symmetrical.  Speech is fluent.  Psychiatric: Judgment intact, Mood & affect appropriate for pt's clinical situation. Dermatologic: No rashes or ulcers noted.  No cellulitis or open wounds.      Labs Recent Results (from the past 2160 hour(s))  TSH     Status: None   Collection Time: 11/13/22 11:39 AM  Result Value Ref Range   TSH 1.78 0.35 - 5.50 uIU/mL  Lipid panel     Status: None   Collection Time: 11/13/22 11:39 AM  Result Value Ref Range   Cholesterol 125 0 - 200 mg/dL    Comment: ATP III Classification       Desirable:  < 200 mg/dL               Borderline High:  200 - 239 mg/dL          High:  > = 098 mg/dL   Triglycerides 119.1 0.0 - 149.0 mg/dL    Comment: Normal:  <478 mg/dLBorderline High:  150 - 199 mg/dL   HDL 29.56 >21.30 mg/dL   VLDL 86.5 0.0 - 78.4 mg/dL   LDL Cholesterol 55 0 - 99 mg/dL   Total CHOL/HDL Ratio 3     Comment:                Men          Women1/2 Average Risk     3.4  3.3Average Risk          5.0          4.42X Average Risk          9.6          7.13X Average Risk          15.0          11.0                       NonHDL 75.79     Comment: NOTE:  Non-HDL goal should be 30 mg/dL higher than patient's LDL goal (i.e. LDL goal of < 70 mg/dL, would have non-HDL goal of < 100 mg/dL)  Comprehensive metabolic panel     Status: Abnormal   Collection Time: 11/13/22 11:39 AM  Result Value Ref Range   Sodium 139 135 - 145 mEq/L   Potassium 4.9 3.5 - 5.1 mEq/L   Chloride 104 96 - 112 mEq/L   CO2 28 19 - 32 mEq/L   Glucose, Bld 258 (H) 70 - 99 mg/dL   BUN 24 (H) 6 - 23 mg/dL   Creatinine, Ser 7.84 0.40 - 1.20 mg/dL   Total Bilirubin 0.4 0.2 - 1.2 mg/dL   Alkaline Phosphatase 59 39 - 117 U/L   AST 26 0 - 37 U/L   ALT 19 0 - 35 U/L   Total Protein 6.4 6.0 - 8.3 g/dL   Albumin  4.2 3.5 - 5.2 g/dL   GFR 69.62 (L) >95.28 mL/min    Comment: Calculated using the CKD-EPI Creatinine Equation (2021)   Calcium 10.9 (H) 8.4 - 10.5 mg/dL  CBC with Differential/Platelet     Status: None   Collection Time: 11/13/22 11:39 AM  Result Value Ref Range   WBC 4.1 4.0 - 10.5 K/uL   RBC 4.15 3.87 - 5.11 Mil/uL   Hemoglobin 12.2 12.0 - 15.0 g/dL   HCT 41.3 24.4 - 01.0 %   MCV 88.2 78.0 - 100.0 fl   MCHC 33.3 30.0 - 36.0 g/dL   RDW 27.2 53.6 - 64.4 %   Platelets 165.0 150.0 - 400.0 K/uL   Neutrophils Relative % 58.1 43.0 - 77.0 %   Lymphocytes Relative 30.9 12.0 - 46.0 %   Monocytes Relative 8.5 3.0 - 12.0 %   Eosinophils Relative 2.0 0.0 - 5.0 %   Basophils Relative 0.5 0.0 - 3.0 %   Neutro Abs 2.4 1.4 - 7.7 K/uL   Lymphs Abs 1.3 0.7 - 4.0 K/uL   Monocytes Absolute 0.4 0.1 - 1.0 K/uL   Eosinophils Absolute 0.1 0.0 - 0.7 K/uL   Basophils Absolute 0.0 0.0 - 0.1 K/uL  VITAMIN D 25 Hydroxy (Vit-D Deficiency, Fractures)     Status: Abnormal   Collection Time: 11/13/22 11:39 AM  Result Value Ref Range   VITD 27.96 (L) 30.00 - 100.00 ng/mL  Vitamin B12     Status: None   Collection Time: 11/13/22 11:39 AM  Result Value Ref Range   Vitamin B-12 290 211 - 911 pg/mL  PTH, Intact and Calcium     Status: None   Collection Time: 12/13/22  9:08 AM  Result Value Ref Range   PTH 68 16 - 77 pg/mL    Comment: . Interpretive Guide    Intact PTH           Calcium ------------------    ----------           ------- Normal Parathyroid  Normal               Normal Hypoparathyroidism    Low or Low Normal    Low Hyperparathyroidism    Primary            Normal or High       High    Secondary          High                 Normal or Low    Tertiary           High                 High Non-Parathyroid    Hypercalcemia      Low or Low Normal    High .    Calcium 9.6 8.6 - 10.4 mg/dL  VITAMIN D 25 Hydroxy (Vit-D Deficiency, Fractures)     Status: None   Collection Time: 12/13/22  9:08 AM   Result Value Ref Range   VITD 58.18 30.00 - 100.00 ng/mL  NM Myocar Multi W/Spect W/Wall Motion / EF     Status: None   Collection Time: 12/17/22 12:43 PM  Result Value Ref Range   Rest BP 166/62 mmHg   Rest HR 55.0 bpm   Peak HR 96 bpm   Peak BP 166/62 mmHg   Percent HR 70.0 %   ST Depression (mm) 0 mm   Rest Nuclear Isotope Dose 10.1 mCi   Stress Nuclear Isotope Dose 30.2 mCi   SSS 7.0    SRS 7.0    SDS 1.0    TID 1.00    LV sys vol 27.0 mL   LV dias vol 70.0 46 - 106 mL   Nuc Stress EF 61 %  HgB A1c     Status: Abnormal   Collection Time: 12/20/22  1:37 PM  Result Value Ref Range   Hemoglobin A1C 8.5 (A) 4.0 - 5.6 %   HbA1c POC (<> result, manual entry)     HbA1c, POC (prediabetic range)     HbA1c, POC (controlled diabetic range)      Radiology NM Myocar Multi W/Spect W/Wall Motion / EF  Result Date: 12/17/2022   The study is normal. The study is low risk.   No ST deviation was noted.   LV perfusion is normal. There is no evidence of ischemia. There is no evidence of infarction.   Left ventricular function is normal. End diastolic cavity size is normal. End systolic cavity size is normal.   CT attenuation images show severe aortic and coronary artery calcifications.    Assessment/Plan  PAD (peripheral artery disease) (HCC) Her ABIs today fall in the normal range with multiphasic waveforms.  She has mild PAD without significant flow limitation or worrisome symptoms at this time.  No role for intervention.  Recheck in 1 year.  Atherosclerosis of renal artery (HCC) She is a few years status post left renal artery stent placement for severe hypertension.  This is resulted in marked improvement in her blood pressure.  Duplex today shows no hemodynamically significant stenosis in the right renal artery with a widely patent left renal stent.  Continue current medicines.  Recheck in 1 year.  Essential hypertension, benign blood pressure control important in reducing the  progression of atherosclerotic disease. On appropriate oral medications.  Blood pressure much better after renal intervention.   Type 2 diabetes mellitus with stage 2 chronic kidney disease, without long-term current use  of insulin (HCC) blood glucose control important in reducing the progression of atherosclerotic disease. Also, involved in wound healing. On appropriate medications.     Festus Barren, MD  12/28/2022 1:07 PM    This note was created with Dragon medical transcription system.  Any errors from dictation are purely unintentional

## 2022-12-28 NOTE — Assessment & Plan Note (Signed)
blood glucose control important in reducing the progression of atherosclerotic disease. Also, involved in wound healing. On appropriate medications.  

## 2022-12-28 NOTE — Assessment & Plan Note (Signed)
She is a few years status post left renal artery stent placement for severe hypertension.  This is resulted in marked improvement in her blood pressure.  Duplex today shows no hemodynamically significant stenosis in the right renal artery with a widely patent left renal stent.  Continue current medicines.  Recheck in 1 year.

## 2022-12-28 NOTE — Assessment & Plan Note (Signed)
Her ABIs today fall in the normal range with multiphasic waveforms.  She has mild PAD without significant flow limitation or worrisome symptoms at this time.  No role for intervention.  Recheck in 1 year.

## 2023-01-01 ENCOUNTER — Telehealth: Payer: Self-pay | Admitting: Cardiovascular Disease

## 2023-01-01 NOTE — Telephone Encounter (Signed)
Pt c/o medication issue:  1. Name of Medication:   isosorbide mononitrate (IMDUR) 30 MG 24 hr tablet   2. How are you currently taking this medication (dosage and times per day)? As prescribed  3. Are you having a reaction (difficulty breathing--STAT)?   Dizziness and headache  4. What is your medication issue?   Patient stated she is having a problem with her last prescribed medication.

## 2023-01-01 NOTE — Telephone Encounter (Signed)
Spoke with patient and she reports having dizziness to the point of almost falling, terrible headache, weakness that requires her to hold onto chairs. She just does not feel that she can take this medication due to the risk of falling. Advised that I would send this over to provider for his review and any further recommendations. She verbalized understanding with no further questions at this time.

## 2023-01-02 LAB — VAS US ABI WITH/WO TBI
Left ABI: 1.04
Right ABI: 1.08

## 2023-01-03 NOTE — Telephone Encounter (Signed)
Called pt advised of MD recommendation: Unclear if she is taking isosorbide mononitrate half pill 15 mg or full pills 30  She can stop the medication for now  This was started for chest pain/anginal symptoms  Thx  TGollan   Pt reports was taking Imdur 15 mg.  Stopped med on Tuesday 01/01/23.  Is currently feeling much better.  Advised pt to use NTG as needed for CP.  If CP is occurring frequently to call our office. Pt expresses understanding.

## 2023-01-14 DIAGNOSIS — R300 Dysuria: Secondary | ICD-10-CM | POA: Diagnosis not present

## 2023-01-28 ENCOUNTER — Other Ambulatory Visit: Payer: Self-pay | Admitting: Family Medicine

## 2023-01-28 DIAGNOSIS — Z1231 Encounter for screening mammogram for malignant neoplasm of breast: Secondary | ICD-10-CM

## 2023-02-15 ENCOUNTER — Ambulatory Visit
Admission: RE | Admit: 2023-02-15 | Discharge: 2023-02-15 | Disposition: A | Discharge status: Home / Self Care | Payer: Medicare HMO | Source: Ambulatory Visit | Attending: Family Medicine | Admitting: Family Medicine

## 2023-02-15 DIAGNOSIS — Z1231 Encounter for screening mammogram for malignant neoplasm of breast: Secondary | ICD-10-CM | POA: Diagnosis not present

## 2023-02-21 NOTE — Progress Notes (Signed)
GYNECOLOGY PROGRESS NOTE  Subjective:    Patient ID: Tracey [name], female    DOB: 01/08/1936, 87 y.o.   MRN: [id number]  HPI  Patient is a 87 y.o. G40P4 female who presents for evaluation of lower abdominal pain.  She was last seen in 2022. Notes pain has been ongoing off and on for ~ 2 months.  Pain is usually achy, lasting for several minutes and then resolving. Also notes some right groin pain that feels more tugging. Thinks that something may be coming down in her pelvis. Taking Tylenol which helps some.  Also has complete bladder emptying.  Denies dysuria, hematuria, or issues with her bowel movement. Denies  PMB.   The following portions of the patient's history were reviewed and updated as appropriate:   She  has a past medical history of Anxiety, Arthritis, Atrial fibrillation (HCC), Chronic kidney disease, Coronary artery disease, Diabetes mellitus, Dysrhythmia, GERD (gastroesophageal reflux disease), Heart murmur, History of kidney stones, Hyperlipidemia, Hypertension, Peripheral vascular disease (HCC), Rectocele, Syncope (09/2019), and UTI (urinary tract infection).  She  has a past surgical history that includes Cardiac catheterization; Appendectomy; Tonsillectomy; Renal artery stent; Replacement total knee (03/25/2013); Coronary angioplasty with stent; Cataract extraction w/PHACO (Left, 08/15/2015); Colonoscopy with propofol (N/A, 01/16/2016); Esophagogastroduodenoscopy (egd) with propofol (N/A, 01/16/2016); Breast biopsy (Left); Breast biopsy (Right, 2008); Breast excisional biopsy; Lower Extremity Angiography (N/A, 07/08/2019); RENAL ANGIOGRAPHY (Left, 08/26/2019); Abdominal aorta stent; Coronary artery bypass graft (1995); Joint replacement (Right); Eye surgery; and Knee Arthroplasty (Left, 01/06/2020).  Her family history includes Cancer in her brother and daughter; Heart disease in her father; Hyperlipidemia in her daughter; Other in her brother, brother, and brother.  She  reports  that she has never smoked. She has never used smokeless tobacco. She reports that she does not drink alcohol and does not use drugs.  She has a current medication list which includes the following prescription(s): acetaminophen, afluria quadrivalent, carvedilol, clopidogrel, cranberry, cyanocobalamin, ezetimibe, glipizide, onetouch verio, lisinopril, metformin, multivitamin with minerals, nitroglycerin, onetouch delica lancets 33g, pantoprazole, align, and rosuvastatin.  She is allergic to codeine, erythromycin, tramadol, and ciprofloxacin..  Review of Systems Pertinent items noted in HPI and remainder of comprehensive ROS otherwise negative.   Objective:   Blood pressure (!) 138/57, pulse 63, resp. rate 16, height 5' 2.5" (1.588 m), weight 128 lb 14.4 oz (58.5 kg). Body mass index is 23.2 kg/m. General appearance: alert and no distress Abdomen: soft, non-distended, mildly tender in lower abdomen, no masses.  Pelvic: external genitalia normal, rectovaginal septum normal.  Vagina without discharge, atrophy present. Grade 1 cystocele on Valsalva.  Cervix normal appearing, no lesions and no motion tenderness.  Uterus mobile, nontender, normal shape and size.  Adnexae non-palpable, nontender bilaterally.  Extremities: extremities normal, atraumatic, no cyanosis or edema Neurologic: Grossly normal    Labs:  Results for orders placed or performed in visit on 02/22/23  POCT Urinalysis Dipstick  Result Value Ref Range   Color, UA yellow    Clarity, UA clear    Glucose, UA Negative Negative   Bilirubin, UA neg    Ketones, UA neg    Spec Grav, UA 1.015 1.010 - 1.025   Blood, UA neg    pH, UA 5.0 5.0 - 8.0   Protein, UA Negative Negative   Urobilinogen, UA 0.2 0.2 or 1.0 E.U./dL   Nitrite, UA neg    Leukocytes, UA Small (1+) (A) Negative   Appearance     Odor  Assessment:   1. Lower abdominal pain   2. Incomplete emptying of bladder   3. Baden-Walker grade 1 cystocele       Plan:   1. Lower abdominal pain - POCT Urinalysis Dipstick negative - Pelvic ultrasound ordered. Will call with results.   2. Incomplete emptying of bladder - POCT Urinalysis Dipstick negative  3. Grade 1 cystocele - Discussed that this could be a cause of her discomfort. If negative  pelvic US, can consider pessary use.    Hildred Laser, MD [facility]

## 2023-02-22 ENCOUNTER — Encounter: Payer: Self-pay | Admitting: Obstetrics and Gynecology

## 2023-02-22 ENCOUNTER — Ambulatory Visit (INDEPENDENT_AMBULATORY_CARE_PROVIDER_SITE_OTHER): Payer: Medicare HMO | Admitting: Obstetrics and Gynecology

## 2023-02-22 VITALS — BP 138/57 | HR 63 | Resp 16 | Ht 62.5 in | Wt 128.9 lb

## 2023-02-22 DIAGNOSIS — R339 Retention of urine, unspecified: Secondary | ICD-10-CM | POA: Diagnosis not present

## 2023-02-22 DIAGNOSIS — R1030 Lower abdominal pain, unspecified: Secondary | ICD-10-CM | POA: Diagnosis not present

## 2023-02-22 DIAGNOSIS — N8110 Cystocele, unspecified: Secondary | ICD-10-CM | POA: Diagnosis not present

## 2023-02-22 LAB — POCT URINALYSIS DIPSTICK
Bilirubin, UA: NEGATIVE
Blood, UA: NEGATIVE
Glucose, UA: NEGATIVE
Ketones, UA: NEGATIVE
Nitrite, UA: NEGATIVE
Protein, UA: NEGATIVE
Spec Grav, UA: 1.015 (ref 1.010–1.025)
Urobilinogen, UA: 0.2 E.U./dL
pH, UA: 5 (ref 5.0–8.0)

## 2023-03-13 ENCOUNTER — Other Ambulatory Visit: Payer: Medicare HMO

## 2023-03-13 ENCOUNTER — Ambulatory Visit
Admission: RE | Admit: 2023-03-13 | Discharge: 2023-03-13 | Disposition: A | Discharge status: Home / Self Care | Payer: Medicare HMO | Source: Ambulatory Visit | Attending: Obstetrics and Gynecology | Admitting: Obstetrics and Gynecology

## 2023-03-13 DIAGNOSIS — R1030 Lower abdominal pain, unspecified: Secondary | ICD-10-CM | POA: Diagnosis not present

## 2023-03-13 DIAGNOSIS — N858 Other specified noninflammatory disorders of uterus: Secondary | ICD-10-CM | POA: Diagnosis not present

## 2023-03-13 DIAGNOSIS — R102 Pelvic and perineal pain: Secondary | ICD-10-CM | POA: Diagnosis not present

## 2023-04-01 ENCOUNTER — Encounter: Payer: Self-pay | Admitting: Nurse Practitioner

## 2023-04-01 ENCOUNTER — Ambulatory Visit (INDEPENDENT_AMBULATORY_CARE_PROVIDER_SITE_OTHER): Payer: Medicare HMO | Admitting: Nurse Practitioner

## 2023-04-01 VITALS — BP 135/67 | HR 56 | Ht 62.5 in | Wt 130.0 lb

## 2023-04-01 DIAGNOSIS — N182 Chronic kidney disease, stage 2 (mild): Secondary | ICD-10-CM

## 2023-04-01 DIAGNOSIS — Z7984 Long term (current) use of oral hypoglycemic drugs: Secondary | ICD-10-CM

## 2023-04-01 DIAGNOSIS — E1122 Type 2 diabetes mellitus with diabetic chronic kidney disease: Secondary | ICD-10-CM

## 2023-04-01 DIAGNOSIS — I10 Essential (primary) hypertension: Secondary | ICD-10-CM | POA: Diagnosis not present

## 2023-04-01 DIAGNOSIS — E559 Vitamin D deficiency, unspecified: Secondary | ICD-10-CM | POA: Diagnosis not present

## 2023-04-01 DIAGNOSIS — E782 Mixed hyperlipidemia: Secondary | ICD-10-CM | POA: Diagnosis not present

## 2023-04-01 LAB — POCT GLYCOSYLATED HEMOGLOBIN (HGB A1C): Hemoglobin A1C: 8.1 % — AB (ref 4.0–5.6)

## 2023-04-01 LAB — POCT UA - MICROALBUMIN: Creatinine, POC: 10 mg/dL

## 2023-04-01 MED ORDER — GLIPIZIDE ER 5 MG PO TB24: 5.0000 mg | ORAL_TABLET | Freq: Every day | ORAL | 1 refills | Status: DC

## 2023-04-01 NOTE — Progress Notes (Signed)
04/01/2023, 2:28 PM  Endocrinology follow-up note   Subjective:    Patient ID: Tracey [name], female    DOB: 19-Nov-1935.  Tracey [name] is being seen  in follow-up after she was seen in consultation for management of currently uncontrolled symptomatic diabetes requested by  [name], MD.   Past Medical History:  Diagnosis Date   Anxiety    Arthritis    Atrial fibrillation (HCC)    history   Chronic kidney disease    ckd stage II   Coronary artery disease    Diabetes mellitus    non insulin dependent   Dysrhythmia    atrial fibrillation, history of   GERD (gastroesophageal reflux disease)    Heart murmur    History of kidney stones    Hyperlipidemia    Hypertension    Peripheral vascular disease (HCC)    Rectocele    Syncope 09/2019   UTI (urinary tract infection)     Past Surgical History:  Procedure Laterality Date   ABDOMINAL AORTA STENT     APPENDECTOMY     BREAST BIOPSY Left    20 years ago   BREAST BIOPSY Right 2008   BREAST EXCISIONAL BIOPSY     rt 2008 lt "20 years ago"   CARDIAC CATHETERIZATION     CATARACT EXTRACTION W/PHACO Left 08/15/2015   Procedure: CATARACT EXTRACTION PHACO AND INTRAOCULAR LENS PLACEMENT (IOC);  Surgeon: Sallee Lange, MD;  Location: [facility] ORS;  Service: Ophthalmology;  Laterality: Left;  [place] 01:28 AP% 26.4 CDE 35.73 fluid pack # [id number]   COLONOSCOPY WITH PROPOFOL N/A 01/16/2016   Procedure: COLONOSCOPY WITH PROPOFOL;  Surgeon: Scot Jun, MD;  Location: [facility] ENDOSCOPY;  Service: Endoscopy;  Laterality: N/A;   CORONARY ANGIOPLASTY WITH STENT PLACEMENT     CORONARY ARTERY BYPASS GRAFT  1995   with LIMA  to the LAD, SVG to OM1, SVG to PDA   ESOPHAGOGASTRODUODENOSCOPY (EGD) WITH PROPOFOL N/A 01/16/2016   Procedure: ESOPHAGOGASTRODUODENOSCOPY (EGD) WITH PROPOFOL;  Surgeon: Scot Jun, MD;  Location: [facility] ENDOSCOPY;  Service:  Endoscopy;  Laterality: N/A;   EYE SURGERY     JOINT REPLACEMENT Right    KNEE ARTHROPLASTY Left 01/06/2020   Procedure: COMPUTER ASSISTED TOTAL KNEE ARTHROPLASTY;  Surgeon: Donato Heinz, MD;  Location: [facility] ORS;  Service: Orthopedics;  Laterality: Left;   LOWER EXTREMITY ANGIOGRAPHY N/A 07/08/2019   Procedure: Abdominal Aortagraphy;  Surgeon: Annice Needy, MD;  Location: [facility] INVASIVE CV LAB;  Service: Cardiovascular;  Laterality: N/A;   RENAL ANGIOGRAPHY Left 08/26/2019   Procedure: RENAL ANGIOGRAPHY;  Surgeon: Annice Needy, MD;  Location: [facility] INVASIVE CV LAB;  Service: Cardiovascular;  Laterality: Left;   RENAL ARTERY STENT     left, By Dr Earnestine Leys   REPLACEMENT TOTAL KNEE  03/25/2013   right   TONSILLECTOMY      Social History   Socioeconomic History   Marital status: Married    Spouse name: Not on file   Number of children: Not on file   Years of education: Not on file   Highest education level: Not  on file  Occupational History   Occupation: school cafeteria    Comment: retired  Tobacco Use   Smoking status: Never   Smokeless tobacco: Never  Vaping Use   Vaping status: Never Used  Substance and Sexual Activity   Alcohol use: No   Drug use: No   Sexual activity: Never  Other Topics Concern   Not on file  Social History Narrative   Patient has 3 daughters. Remarried June 2021.   Social Determinants of Health   Financial Resource Strain: Low Risk  (09/05/2022)   Overall Financial Resource Strain (CARDIA)    Difficulty of Paying Living Expenses: Not hard at all  Food Insecurity: No Food Insecurity (09/05/2022)   Hunger Vital Sign    Worried About Running Out of Food in the Last Year: Never true    Ran Out of Food in the Last Year: Never true  Transportation Needs: No Transportation Needs (09/05/2022)   PRAPARE - Administrator, Civil Service (Medical): No    Lack of Transportation (Non-Medical): No  Physical Activity: Insufficiently Active (09/05/2022)    Exercise Vital Sign    Days of Exercise per Week: 7 days    Minutes of Exercise per Session: 10 min  Stress: No Stress Concern Present (09/05/2022)   [employer] of Occupational Health - Occupational Stress Questionnaire    Feeling of Stress : Not at all  Social Connections: Socially Integrated (09/05/2022)   Social Connection and Isolation Panel [NHANES]    Frequency of Communication with Friends and Family: More than three times a week    Frequency of Social Gatherings with Friends and Family: More than three times a week    Attends Religious Services: More than 4 times per year    Active Member of [employer] or Organizations: Yes    Attends Engineer, structural: More than 4 times per year    Marital Status: Married    Family History  Problem Relation Age of Onset   Heart disease Father    Other Brother        open heart surgery   Cancer Brother    Other Brother        open heart surgery   Other Brother        open heart surgery   Cancer Daughter    Hyperlipidemia Daughter    Breast cancer Neg Hx    Kidney cancer Neg Hx    Bladder Cancer Neg Hx     Outpatient Encounter Medications as of 04/01/2023  Medication Sig   acetaminophen (TYLENOL) 500 MG tablet Take 500 mg by mouth in the morning and at bedtime.   AFLURIA QUADRIVALENT injection Inject 0.5 mLs into the muscle.   carvedilol (COREG) 3.125 MG tablet Take 1 tablet (3.125 mg total) by mouth daily.   clopidogrel (PLAVIX) 75 MG tablet Take 1 tablet (75 mg total) by mouth daily.   CRANBERRY PO Take 1 tablet by mouth daily.   Cyanocobalamin (VITAMIN B-12 IJ) Inject as directed See admin instructions. Every two months   ezetimibe (ZETIA) 10 MG tablet Take 1 tablet (10 mg total) by mouth daily.   glipiZIDE (GLUCOTROL XL) 5 MG 24 hr tablet Take 1 tablet (5 mg total) by mouth daily with breakfast.   glucose blood (ONETOUCH VERIO) test strip Use as instructed to monitor glucose twice daily   lisinopril (ZESTRIL) 10  MG tablet Take 1 tablet (10 mg total) by mouth daily.   metFORMIN (GLUCOPHAGE-XR) 500 MG 24  hr tablet Take 1 tablet (500 mg total) by mouth daily.   Multiple Vitamins-Minerals (MULTIVITAMIN WITH MINERALS) tablet Take 1 tablet by mouth daily.   nitroGLYCERIN (NITROSTAT) 0.4 MG SL tablet Place 1 tablet (0.4 mg total) under the tongue every 5 (five) minutes as needed for chest pain.   OneTouch Delica Lancets 33G MISC USE TO TEST TWICE A DAY   pantoprazole (PROTONIX) 40 MG tablet TAKE ONE TABLET BY MOUTH ONCE DAILY (Patient taking differently: Take 40 mg by mouth every other day.)   rosuvastatin (CRESTOR) 40 MG tablet Take 1 tablet (40 mg total) by mouth daily.   [DISCONTINUED] glipiZIDE (GLUCOTROL XL) 2.5 MG 24 hr tablet Take 1 tablet (2.5 mg total) by mouth daily.   Probiotic Product (ALIGN) 4 MG CAPS Take 1 capsule by mouth daily. (Patient not taking: Reported on 04/01/2023)   No facility-administered encounter medications on file as of 04/01/2023.    ALLERGIES: Allergies  Allergen Reactions   Codeine Nausea Only    Sick to her stomach   Erythromycin Nausea And Vomiting and Other (See Comments)   Tramadol Nausea Only and Other (See Comments)    Loss of appetite and did not eat   Ciprofloxacin Diarrhea    Upsets stomach    VACCINATION STATUS: Immunization History  Administered Date(s) Administered   Fluad Quad(high Dose 65+) 06/15/2019, 09/26/2020   Influenza Split 08/10/2015   Influenza, High Dose Seasonal PF 11/05/2022   Influenza,inj,Quad PF,6+ Mos 05/24/2016, 07/26/2017, 06/03/2018, 07/11/2021   Influenza-Unspecified 08/10/2015, 05/24/2016   Moderna Sars-Covid-2 Vaccination 10/02/2019, 10/30/2019, 08/01/2020   Pneumococcal Conjugate-13 11/05/2016   Pneumococcal Polysaccharide-23 12/11/2017   Td 03/17/2013    Diabetes She presents for her follow-up diabetic visit. She has type 2 diabetes mellitus. Onset time: She was diagnosed at approximate age of 34 years. Her disease course  has been improving. There are no hypoglycemic associated symptoms. Pertinent negatives for hypoglycemia include no confusion, headaches, pallor or seizures. Associated symptoms include blurred vision (especially in the evenings) and fatigue. Pertinent negatives for diabetes include no chest pain, no polydipsia, no polyphagia, no polyuria and no weight loss. There are no hypoglycemic complications. Symptoms are stable. Diabetic complications include heart disease, nephropathy and PVD. Risk factors for coronary artery disease include dyslipidemia, diabetes mellitus, hypertension, sedentary lifestyle and post-menopausal. Current diabetic treatment includes oral agent (dual therapy). She is compliant with treatment all of the time. Her weight is fluctuating minimally. She is following a generally healthy diet. Meal planning includes avoidance of concentrated sweets and ADA exchanges. She rarely participates in exercise. Her home blood glucose trend is fluctuating minimally. Her breakfast blood glucose range is generally 140-180 mg/dl. (She presents today with her meter and logs showing slightly above target fasting glycemic profile.  Her POCT A1c today is 8.1%, improving from last visit of 8.5%.  She notes rare hypoglycemia, did not check it but treated it with peanut butter wafers.) An ACE inhibitor/angiotensin II receptor blocker is being taken. She does not see a podiatrist.Eye exam is current.  Hyperlipidemia This is a chronic problem. The current episode started more than 1 year ago. The problem is controlled. Recent lipid tests were reviewed and are normal. Exacerbating diseases include chronic renal disease and diabetes. Factors aggravating her hyperlipidemia include beta blockers. Pertinent negatives include no chest pain, myalgias or shortness of breath. Current antihyperlipidemic treatment includes statins. The current treatment provides moderate improvement of lipids. There are no compliance problems.  Risk  factors for coronary artery disease include dyslipidemia, diabetes  mellitus, hypertension, post-menopausal and a sedentary lifestyle.  Hypertension This is a chronic problem. The current episode started more than 1 year ago. The problem has been gradually improving since onset. The problem is controlled. Associated symptoms include blurred vision (especially in the evenings). Pertinent negatives include no chest pain, headaches, palpitations or shortness of breath. There are no associated agents to hypertension. Risk factors for coronary artery disease include diabetes mellitus, dyslipidemia, sedentary lifestyle and post-menopausal state. Past treatments include beta blockers and ACE inhibitors. The current treatment provides moderate improvement. There are no compliance problems.  Hypertensive end-organ damage includes kidney disease, CAD/MI and PVD. Identifiable causes of hypertension include chronic renal disease.    Review of systems  Constitutional: + Minimally fluctuating body weight,  current Body mass index is 23.4 kg/m. , no fatigue, no subjective hyperthermia, no subjective hypothermia Eyes: no blurry vision, no xerophthalmia ENT: no sore throat, no nodules palpated in throat, no dysphagia/odynophagia, no hoarseness Cardiovascular: no chest pain, no shortness of breath, no palpitations, no leg swelling Respiratory: no cough, no shortness of breath Gastrointestinal: no nausea/vomiting/diarrhea Musculoskeletal: no muscle/joint aches Skin: no rashes, no hyperemia Neurological: no tremors, no numbness, no tingling, no dizziness Psychiatric: no depression, no anxiety   Objective:    BP 135/67 (BP Location: Left Arm, Patient Position: Sitting, Cuff Size: Normal)   Pulse (!) 56   Ht 5' 2.5" (1.588 m)   Wt 130 lb (59 kg)   BMI 23.40 kg/m   Wt Readings from Last 3 Encounters:  04/01/23 130 lb (59 kg)  02/22/23 128 lb 14.4 oz (58.5 kg)  12/28/22 129 lb 12.8 oz (58.9 kg)    BP  Readings from Last 3 Encounters:  04/01/23 135/67  02/22/23 (!) 138/57  12/28/22 (!) 150/55     Physical Exam- Limited  Constitutional:  Body mass index is 23.4 kg/m. , not in acute distress, normal state of mind Eyes:  EOMI, no exophthalmos Musculoskeletal: no gross deformities, strength intact in all four extremities, no gross restriction of joint movements Skin:  no rashes, no hyperemia Neurological: no tremor with outstretched hands    CMP ( most recent) CMP     Component Value Date/Time   NA 139 11/13/2022 1139   NA 137 03/27/2013 0545   K 4.9 11/13/2022 1139   K 3.9 03/27/2013 0545   CL 104 11/13/2022 1139   CL 103 03/27/2013 0545   CO2 28 11/13/2022 1139   CO2 27 03/27/2013 0545   GLUCOSE 258 (H) 11/13/2022 1139   GLUCOSE 122 (H) 03/27/2013 0545   BUN 24 (H) 11/13/2022 1139   BUN 10 03/27/2013 0545   CREATININE 1.04 11/13/2022 1139   CREATININE 0.99 (H) 04/24/2021 1043   CALCIUM 9.6 12/13/2022 0908   CALCIUM 8.9 03/27/2013 0545   PROT 6.4 11/13/2022 1139   PROT 7.6 10/07/2011 1316   ALBUMIN 4.2 11/13/2022 1139   ALBUMIN 4.5 10/07/2011 1316   AST 26 11/13/2022 1139   AST 26 10/07/2011 1316   ALT 19 11/13/2022 1139   ALT 26 10/07/2011 1316   ALKPHOS 59 11/13/2022 1139   ALKPHOS 46 (L) 10/07/2011 1316   BILITOT 0.4 11/13/2022 1139   BILITOT 0.5 10/07/2011 1316   GFRNONAA 54 (L) 03/03/2020 0000   GFRAA 63 03/03/2020 0000     Diabetic Labs (most recent): Lab Results  Component Value Date   HGBA1C 8.1 (A) 04/01/2023   HGBA1C 8.5 (A) 12/20/2022   HGBA1C 8.3 (A) 09/20/2022   MICROALBUR 30mg /L 04/01/2023  MICROALBUR 30 04/26/2021   MICROALBUR 1.9 03/03/2020     Lipid Panel ( most recent) Lipid Panel     Component Value Date/Time   CHOL 125 11/13/2022 1139   CHOL 158 12/12/2020 1115   CHOL 141 10/08/2011 0457   TRIG 104.0 11/13/2022 1139   TRIG 179 10/08/2011 0457   HDL 49.20 11/13/2022 1139   HDL 52 12/12/2020 1115   HDL 37 (L) 10/08/2011  0457   CHOLHDL 3 11/13/2022 1139   VLDL 20.8 11/13/2022 1139   VLDL 36 10/08/2011 0457   LDLCALC 55 11/13/2022 1139   LDLCALC 70 04/24/2021 1043   LDLCALC 68 10/08/2011 0457      Lab Results  Component Value Date   TSH 1.78 11/13/2022   TSH 2.44 11/01/2021   TSH 1.94 06/07/2021   TSH 1.99 04/24/2021   TSH 1.66 03/03/2020   TSH 2.68 03/05/2019   TSH 2.96 11/06/2017   TSH 1.24 02/01/2017   TSH 1.86 02/06/2016   FREET4 1.4 04/24/2021   FREET4 1.4 03/03/2020      Assessment & Plan:   1) Type 2 diabetes complicated by stage 2 CKD  - Naveya M Nomura has currently controlled asymptomatic type 2 DM since  87 years of age.  She presents today with her meter and logs showing slightly above target fasting glycemic profile.  Her POCT A1c today is 8.1%, improving from last visit of 8.5%.  She notes rare hypoglycemia, did not check it but treated it with peanut butter wafers.  -recent labs reviewed. POCT UM shows moderate microalbuminuria, consistent with stage 3a kidney disease.  Will plan to recheck UM next visit as well.   - I had a long discussion with her about the progressive nature of diabetes and the pathology behind its complications. -her diabetes is complicated by stage 2 CKD and she remains at a high risk for more acute and chronic complications which include CAD, CVA, CKD, retinopathy, and neuropathy. These are all discussed in detail with her.  The following Lifestyle Medicine recommendations according to American College of Lifestyle Medicine [facility]) were discussed and offered to patient and she agrees to start the journey:  A. Whole Foods, Plant-based plate comprising of fruits and vegetables, plant-based proteins, whole-grain carbohydrates was discussed in detail with the patient.   A list for source of those nutrients were also provided to the patient.  Patient will use only water or unsweetened tea for hydration. B.  The need to stay away from risky substances including  alcohol, smoking; obtaining 7 to 9 hours of restorative sleep, at least 150 minutes of moderate intensity exercise weekly, the importance of healthy social connections,  and stress reduction techniques were discussed. C.  A full color page of  Calorie density of various food groups per pound showing examples of each food groups was provided to the patient.  - Nutritional counseling repeated at each appointment due to patients tendency to fall back in to old habits.  - The patient admits there is a room for improvement in their diet and drink choices. -  Suggestion is made for the patient to avoid simple carbohydrates from their diet including Cakes, Sweet Desserts / Pastries, Ice Cream, Soda (diet and regular), Sweet Tea, Candies, Chips, Cookies, Sweet Pastries, Store Bought Juices, Alcohol in Excess of 1-2 drinks a day, Artificial Sweeteners, Coffee Creamer, and "Sugar-free" Products. This will help patient to have stable blood glucose profile and potentially avoid unintended weight gain.   - I encouraged the  patient to switch to unprocessed or minimally processed complex starch and increased protein intake (animal or plant source), fruits, and vegetables.   - Patient is advised to stick to a routine mealtimes to eat 3 meals a day and avoid unnecessary snacks (to snack only to correct hypoglycemia).  - I have approached her with the following individualized plan to manage  her diabetes and patient agrees:   -She is advised to continue Metformin 500 mg ER daily with breakfast and increase her Glipizide to 5 mg XL PO daily with breakfast for now.  Fasting goal is to get glucose 100-120 consistently.  -She is encouraged to continue monitoring blood glucose at least once daily, before breakfast and call the clinic if she gets readings less than 70 or greater than 200 for 3 tests in a row.  - she is not a candidate for SGLT2 inhibitors or full dose Metformin due to concurrent renal insufficiency.  -  she is not a candidate for incretin therapy due to her body habitus.    She could not afford copay for Januvia, or Jardiance.  - Patient specific target  A1c;  LDL, HDL, Triglycerides, and  Waist Circumference were discussed in detail.  2) Blood Pressure /Hypertension:  Her blood pressure is controlled to target.  She is advised to continue Coreg 3.125 mg po twice daily and Lisinopril 10 mg po daily.   3) Lipids/Hyperlipidemia: Her most recent lipid panel from 11/13/22 shows controlled LDL at 55.  She is advised to continue Crestor 40 mg po daily at bedtime.  Side effects and precautions discussed with her.    4)  Weight/Diet: Her Body mass index is 23.4 kg/m.  -She is not a candidate for weight loss.  Exercise, and detailed carbohydrates information provided  -  detailed on discharge instructions.  5) Chronic Care/Health Maintenance: -she is on ACEI/ARB and Statin medications and  is encouraged to initiate and continue to follow up with Ophthalmology, Dentist,  Podiatrist at least yearly or according to recommendations, and advised to   stay away from smoking. I have recommended yearly flu vaccine and pneumonia vaccine at least every 5 years; moderate intensity exercise for up to 150 minutes weekly; and  sleep for at least 7 hours a day.  - she is advised to maintain close follow up with [facility], Audrie Gallus, MD for primary care needs, as well as her other providers for optimal and coordinated care.      I spent  40  minutes in the care of the patient today including review of labs from CMP, Lipids, Thyroid Function, Hematology (current and previous including abstractions from other facilities); face-to-face time discussing  her blood glucose readings/logs, discussing hypoglycemia and hyperglycemia episodes and symptoms, medications doses, her options of short and long term treatment based on the latest standards of care / guidelines;  discussion about incorporating lifestyle medicine;  and  documenting the encounter. Risk reduction counseling performed per USPSTF guidelines to reduce obesity and cardiovascular risk factors.     Please refer to Patient Instructions for Blood Glucose Monitoring and Insulin/Medications Dosing Guide"  in media tab for additional information. Please  also refer to " Patient Self Inventory" in the Media  tab for reviewed elements of pertinent patient history.  Sharyn Lull Sanderson participated in the discussions, expressed understanding, and voiced agreement with the above plans.  All questions were answered to her satisfaction. she is encouraged to contact clinic should she have any questions or concerns prior to her  return visit.    Follow up plan: - Return in about 3 months (around 07/02/2023) for Diabetes F/U with A1c in office, Previsit labs, Bring meter and logs.  Ronny Bacon, [facility] [address] [place], [place] [address] Phone: [phone] Fax: [phone]  04/01/2023, 2:28 PM

## 2023-04-12 ENCOUNTER — Other Ambulatory Visit: Payer: Self-pay | Admitting: Cardiovascular Disease

## 2023-04-15 ENCOUNTER — Other Ambulatory Visit: Payer: Self-pay | Admitting: Cardiovascular Disease

## 2023-05-07 ENCOUNTER — Other Ambulatory Visit: Payer: Self-pay | Admitting: Cardiovascular Disease

## 2023-05-21 DIAGNOSIS — H5712 Ocular pain, left eye: Secondary | ICD-10-CM | POA: Diagnosis not present

## 2023-05-21 DIAGNOSIS — E119 Type 2 diabetes mellitus without complications: Secondary | ICD-10-CM | POA: Diagnosis not present

## 2023-05-21 DIAGNOSIS — D3131 Benign neoplasm of right choroid: Secondary | ICD-10-CM | POA: Diagnosis not present

## 2023-05-22 ENCOUNTER — Encounter: Payer: Self-pay | Admitting: "Endocrinology

## 2023-05-22 LAB — HM DIABETES EYE EXAM

## 2023-05-23 ENCOUNTER — Encounter: Payer: Self-pay | Admitting: Family Medicine

## 2023-06-14 ENCOUNTER — Ambulatory Visit: Payer: Medicare HMO | Admitting: Cardiovascular Disease

## 2023-06-19 ENCOUNTER — Other Ambulatory Visit (INDEPENDENT_AMBULATORY_CARE_PROVIDER_SITE_OTHER): Payer: Medicare HMO

## 2023-06-19 DIAGNOSIS — E1122 Type 2 diabetes mellitus with diabetic chronic kidney disease: Secondary | ICD-10-CM

## 2023-06-19 DIAGNOSIS — N182 Chronic kidney disease, stage 2 (mild): Secondary | ICD-10-CM | POA: Diagnosis not present

## 2023-06-19 NOTE — Addendum Note (Signed)
Addended by: Alvina Chou on: 06/19/2023 09:23 AM   Modules accepted: Orders

## 2023-06-20 LAB — COMPREHENSIVE METABOLIC PANEL
AG Ratio: 2 (calc) (ref 1.0–2.5)
ALT: 23 U/L (ref 6–29)
AST: 30 U/L (ref 10–35)
Albumin: 4.3 g/dL (ref 3.6–5.1)
Alkaline phosphatase (APISO): 54 U/L (ref 37–153)
BUN/Creatinine Ratio: 27 (calc) — ABNORMAL HIGH (ref 6–22)
BUN: 29 mg/dL — ABNORMAL HIGH (ref 7–25)
CO2: 23 mmol/L (ref 20–32)
Calcium: 10.3 mg/dL (ref 8.6–10.4)
Chloride: 106 mmol/L (ref 98–110)
Creat: 1.08 mg/dL — ABNORMAL HIGH (ref 0.60–0.95)
Globulin: 2.1 g/dL (ref 1.9–3.7)
Glucose, Bld: 135 mg/dL — ABNORMAL HIGH (ref 65–99)
Potassium: 4.2 mmol/L (ref 3.5–5.3)
Sodium: 141 mmol/L (ref 135–146)
Total Bilirubin: 0.5 mg/dL (ref 0.2–1.2)
Total Protein: 6.4 g/dL (ref 6.1–8.1)

## 2023-06-29 ENCOUNTER — Other Ambulatory Visit: Payer: Self-pay

## 2023-06-29 ENCOUNTER — Emergency Department
Admission: EM | Admit: 2023-06-29 | Discharge: 2023-06-30 | Disposition: A | Discharge status: Home / Self Care | Payer: Medicare HMO | Attending: Emergency Medicine | Admitting: Emergency Medicine

## 2023-06-29 ENCOUNTER — Encounter (HOSPITAL_COMMUNITY): Payer: Self-pay

## 2023-06-29 ENCOUNTER — Emergency Department: Payer: Medicare HMO

## 2023-06-29 DIAGNOSIS — I2510 Atherosclerotic heart disease of native coronary artery without angina pectoris: Secondary | ICD-10-CM | POA: Diagnosis not present

## 2023-06-29 DIAGNOSIS — Y92009 Unspecified place in unspecified non-institutional (private) residence as the place of occurrence of the external cause: Secondary | ICD-10-CM | POA: Diagnosis not present

## 2023-06-29 DIAGNOSIS — Z7902 Long term (current) use of antithrombotics/antiplatelets: Secondary | ICD-10-CM | POA: Diagnosis not present

## 2023-06-29 DIAGNOSIS — E119 Type 2 diabetes mellitus without complications: Secondary | ICD-10-CM | POA: Insufficient documentation

## 2023-06-29 DIAGNOSIS — M9712XA Periprosthetic fracture around internal prosthetic left knee joint, initial encounter: Secondary | ICD-10-CM | POA: Diagnosis not present

## 2023-06-29 DIAGNOSIS — W109XXA Fall (on) (from) unspecified stairs and steps, initial encounter: Secondary | ICD-10-CM | POA: Insufficient documentation

## 2023-06-29 DIAGNOSIS — M9702XA Periprosthetic fracture around internal prosthetic left hip joint, initial encounter: Secondary | ICD-10-CM | POA: Diagnosis not present

## 2023-06-29 DIAGNOSIS — R0902 Hypoxemia: Secondary | ICD-10-CM | POA: Diagnosis not present

## 2023-06-29 DIAGNOSIS — R001 Bradycardia, unspecified: Secondary | ICD-10-CM | POA: Diagnosis not present

## 2023-06-29 DIAGNOSIS — I10 Essential (primary) hypertension: Secondary | ICD-10-CM | POA: Insufficient documentation

## 2023-06-29 DIAGNOSIS — M25562 Pain in left knee: Secondary | ICD-10-CM | POA: Diagnosis not present

## 2023-06-29 DIAGNOSIS — R609 Edema, unspecified: Secondary | ICD-10-CM | POA: Diagnosis not present

## 2023-06-29 DIAGNOSIS — M25462 Effusion, left knee: Secondary | ICD-10-CM | POA: Diagnosis not present

## 2023-06-29 DIAGNOSIS — S8992XA Unspecified injury of left lower leg, initial encounter: Secondary | ICD-10-CM | POA: Diagnosis present

## 2023-06-29 DIAGNOSIS — I959 Hypotension, unspecified: Secondary | ICD-10-CM | POA: Diagnosis not present

## 2023-06-29 DIAGNOSIS — S80912A Unspecified superficial injury of left knee, initial encounter: Secondary | ICD-10-CM | POA: Diagnosis not present

## 2023-06-29 DIAGNOSIS — Z96653 Presence of artificial knee joint, bilateral: Secondary | ICD-10-CM | POA: Diagnosis not present

## 2023-06-29 LAB — COMPREHENSIVE METABOLIC PANEL
ALT: 30 U/L (ref 0–44)
AST: 37 U/L (ref 15–41)
Albumin: 4.3 g/dL (ref 3.5–5.0)
Alkaline Phosphatase: 51 U/L (ref 38–126)
Anion gap: 9 (ref 5–15)
BUN: 32 mg/dL — ABNORMAL HIGH (ref 8–23)
CO2: 23 mmol/L (ref 22–32)
Calcium: 10.3 mg/dL (ref 8.9–10.3)
Chloride: 104 mmol/L (ref 98–111)
Creatinine, Ser: 0.96 mg/dL (ref 0.44–1.00)
GFR, Estimated: 58 mL/min — ABNORMAL LOW (ref >60)
Glucose, Bld: 244 mg/dL — ABNORMAL HIGH (ref 70–99)
Potassium: 4.6 mmol/L (ref 3.5–5.1)
Sodium: 136 mmol/L (ref 135–145)
Total Bilirubin: 0.9 mg/dL (ref 0.3–1.2)
Total Protein: 7.3 g/dL (ref 6.5–8.1)

## 2023-06-29 LAB — CBC
HCT: 35.2 % — ABNORMAL LOW (ref 36.0–46.0)
Hemoglobin: 11.5 g/dL — ABNORMAL LOW (ref 12.0–15.0)
MCH: 29.9 pg (ref 26.0–34.0)
MCHC: 32.7 g/dL (ref 30.0–36.0)
MCV: 91.7 fL (ref 80.0–100.0)
Platelets: 149 10*3/uL — ABNORMAL LOW (ref 150–400)
RBC: 3.84 MIL/uL — ABNORMAL LOW (ref 3.87–5.11)
RDW: 12.2 % (ref 11.5–15.5)
WBC: 11 10*3/uL — ABNORMAL HIGH (ref 4.0–10.5)
nRBC: 0 % (ref 0.0–0.2)

## 2023-06-29 LAB — PROTIME-INR
INR: 1.1 (ref 0.8–1.2)
Prothrombin Time: 14 s (ref 11.4–15.2)

## 2023-06-29 LAB — CBG MONITORING, ED: Glucose-Capillary: 155 mg/dL — ABNORMAL HIGH (ref 70–99)

## 2023-06-29 MED ORDER — PANTOPRAZOLE SODIUM 40 MG PO TBEC: 40.0000 mg | DELAYED_RELEASE_TABLET | Freq: Every day | ORAL | Status: DC

## 2023-06-29 MED ORDER — ROSUVASTATIN CALCIUM 20 MG PO TABS
40.0000 mg | ORAL_TABLET | Freq: Every day | ORAL | Status: DC
  Filled 2023-06-29: qty 2

## 2023-06-29 MED ORDER — EZETIMIBE 10 MG PO TABS: 10.0000 mg | ORAL_TABLET | Freq: Every day | ORAL | Status: DC

## 2023-06-29 MED ORDER — ACETAMINOPHEN 500 MG PO TABS: 500.0000 mg | ORAL_TABLET | Freq: Two times a day (BID) | ORAL | Status: DC | PRN

## 2023-06-29 MED ORDER — FENTANYL CITRATE PF 50 MCG/ML IJ SOSY
50.0000 ug | PREFILLED_SYRINGE | INTRAMUSCULAR | Status: DC | PRN
  Administered 2023-06-29: 50 ug via INTRAVENOUS
  Filled 2023-06-29: qty 1

## 2023-06-29 MED ORDER — OXYCODONE-ACETAMINOPHEN 5-325 MG PO TABS
1.0000 | ORAL_TABLET | Freq: Four times a day (QID) | ORAL | Status: DC | PRN
  Administered 2023-06-30: 1 via ORAL
  Filled 2023-06-29: qty 1

## 2023-06-29 MED ORDER — METFORMIN HCL ER 500 MG PO TB24
500.0000 mg | ORAL_TABLET | Freq: Every day | ORAL | Status: DC
  Administered 2023-06-30: 500 mg via ORAL
  Filled 2023-06-29: qty 1

## 2023-06-29 MED ORDER — GLIPIZIDE ER 5 MG PO TB24
5.0000 mg | ORAL_TABLET | Freq: Every day | ORAL | Status: DC
  Administered 2023-06-30: 5 mg via ORAL
  Filled 2023-06-29: qty 1

## 2023-06-29 MED ORDER — MORPHINE SULFATE (PF) 4 MG/ML IV SOLN
4.0000 mg | Freq: Once | INTRAVENOUS | Status: AC
  Administered 2023-06-29: 4 mg via INTRAVENOUS
  Filled 2023-06-29: qty 1

## 2023-06-29 MED ORDER — CARVEDILOL 6.25 MG PO TABS: 3.1250 mg | ORAL_TABLET | Freq: Every day | ORAL | Status: DC

## 2023-06-29 MED ORDER — MORPHINE SULFATE (PF) 4 MG/ML IV SOLN: 4.0000 mg | INTRAVENOUS | Status: DC | PRN

## 2023-06-29 MED ORDER — ONDANSETRON HCL 4 MG/2ML IJ SOLN
4.0000 mg | Freq: Once | INTRAMUSCULAR | Status: AC
  Administered 2023-06-29: 4 mg via INTRAVENOUS
  Filled 2023-06-29: qty 2

## 2023-06-29 MED ORDER — LISINOPRIL 10 MG PO TABS: 10.0000 mg | ORAL_TABLET | Freq: Every day | ORAL | Status: DC

## 2023-06-29 NOTE — ED Triage Notes (Signed)
Pt arrived via EMS. Pt fell down the stairs at home and her leg went back under her. Pt is a bilateral knee replacement pt. Pt sts that her knee is in a lot of pain. Pt has a 20g LAC with of fentanyl given enroute to ED by EMS.

## 2023-06-29 NOTE — ED Provider Notes (Signed)
[facility] Provider Note    Event Date/Time   First MD Initiated Contact with Patient 06/29/23 1706     (approximate)   History   Chief Complaint Knee Injury   HPI  Tracey [name] is a 87 y.o. female with past medical history of hypertension, diabetes, CAD, PAD, and atrial fibrillation who presents to the ED complaining of knee injury.  Patient reports that just prior to arrival she lost her balance going down the stairs and had her left knee bent back behind her.  She felt an immediate pop and has had significant pain with movement of that knee since then.  She denies any injuries to her other extremities and did not hit her head or lose consciousness.  She does continue to take Plavix regularly.     Physical Exam   Triage Vital Signs: ED Triage Vitals  Encounter Vitals Group     BP 06/29/23 1555 138/89     Systolic BP Percentile --      Diastolic BP Percentile --      Pulse Rate 06/29/23 1555 (!) 58     Resp 06/29/23 1555 17     Temp 06/29/23 1555 97.6 F (36.4 C)     Temp Source 06/29/23 1555 Oral     SpO2 06/29/23 1555 100 %     Weight 06/29/23 1552 130 lb (59 kg)     Height 06/29/23 1552 5\' 3"  (1.6 m)     Head Circumference --      Peak Flow --      Pain Score 06/29/23 1552 10     Pain Loc --      Pain Education --      Exclude from Growth Chart --     Most recent vital signs: Vitals:   06/29/23 1800 06/29/23 1900  BP: (!) 140/64 (!) 125/41  Pulse: 63 (!) 58  Resp:    Temp:    SpO2: 100% 95%    Constitutional: Alert and oriented. Eyes: Conjunctivae are normal. Head: Atraumatic. Nose: No congestion/rhinnorhea. Mouth/Throat: Mucous membranes are moist.  Cardiovascular: Normal rate, regular rhythm. Grossly normal heart sounds.  2+ radial and DP pulses bilaterally. Respiratory: Normal respiratory effort.  No retractions. Lungs CTAB. Gastrointestinal: Soft and nontender. No distention. Musculoskeletal: Diffuse tenderness to  palpation at left knee with significant edema, no tenderness to palpation at left hip or ankle.  No bony tenderness to palpation of the right lower extremity.  No bony tenderness to palpation of upper extremities. Neurologic:  Normal speech and language. No gross focal neurologic deficits are appreciated.    ED Results / Procedures / Treatments   Labs (all labs ordered are listed, but only abnormal results are displayed) Labs Reviewed  CBC - Abnormal; Notable for the following components:      Result Value   WBC 11.0 (*)    RBC 3.84 (*)    Hemoglobin 11.5 (*)    HCT 35.2 (*)    Platelets 149 (*)    All other components within normal limits  COMPREHENSIVE METABOLIC PANEL - Abnormal; Notable for the following components:   Glucose, Bld 244 (*)    BUN 32 (*)    GFR, Estimated 58 (*)    All other components within normal limits  CBG MONITORING, ED - Abnormal; Notable for the following components:   Glucose-Capillary 155 (*)    All other components within normal limits  PROTIME-INR   RADIOLOGY Left knee x-ray reviewed and interpreted  by me with periprosthetic fracture of the distal femur.  PROCEDURES:  Critical Care performed: No  Procedures   MEDICATIONS ORDERED IN ED: Medications  fentaNYL (SUBLIMAZE) injection 50 mcg (50 mcg Intravenous Given 06/29/23 1602)  morphine (PF) 4 MG/ML injection 4 mg (4 mg Intravenous Given 06/29/23 1804)  ondansetron (ZOFRAN) injection 4 mg (4 mg Intravenous Given 06/29/23 1803)     IMPRESSION / MDM / ASSESSMENT AND PLAN / ED COURSE  I reviewed the triage vital signs and the nursing notes.                              87 y.o. female with past medical history of hypertension, diabetes, CAD, PAD, and atrial fibrillation who presents to the ED complaining of left knee pain after falling down the stairs.  Patient's presentation is most consistent with acute presentation with potential threat to life or bodily function.  Differential  diagnosis includes, but is not limited to, fracture, dislocation, contusion, intracranial injury.  Patient nontoxic-appearing and in no acute distress, vital signs are unremarkable.  She has deformity of left knee with periprosthetic fracture of distal femur seen on x-ray.  She is neurovascularly intact distally, currently declines any pain medication.  No evidence of injury to her other extremities, head, or neck.  Labs show no significant anemia, leukocytosis, chest abnormality, or AKI.  LFTs are also unremarkable.  Patient had left knee replacement performed by Dr. Ernest Pine in 2021, she request that we speak with Dr. Ernest Pine to see if he would be able to manage this.  I spoke with Dr. Allena Katz, who will reach out to Dr. Ernest Pine.  We will place patient in a knee immobilizer for now.  Dr. Ernest Pine contacted by Dr. Allena Katz and recommends transfer to trauma center.  Case discussed with [name] of orthopedics at [facility], who is in agreement with transfer, requests discussion with hospitalist for admission.  Case discussed with Dr. Janalyn Shy of hospitalist service at [facility], who accepts patient for transfer.  Patient's pain well-controlled at this time and she has been placed in a knee immobilizer.      FINAL CLINICAL IMPRESSION(S) / ED DIAGNOSES   Final diagnoses:  Periprosthetic fracture around internal prosthetic left knee joint, initial encounter     Rx / DC Orders   ED Discharge Orders     None        Note:  This document was prepared using Dragon voice recognition software and may include unintentional dictation errors.   Chesley Noon, MD 06/29/23 2033

## 2023-06-29 NOTE — ED Notes (Signed)
I called [facility] to follow-up to see if a bed has been assigned to the pt, spoke with rep. Festus Barren she stated it will be a while before the pt gets a bed.

## 2023-06-29 NOTE — ED Notes (Signed)
Spoke with rep Festus Barren at [facility] asked her to page out ortho for possible transfer for pt. in room #3. Rep was given brief information about the pt,rep stated she would page Ortho and call us back.

## 2023-06-29 NOTE — Progress Notes (Addendum)
Plan of Care Note for accepted transfer  Patient: Tracey [name]              NWG:[id number]  DOA: 06/29/2023     Facility requesting transfer: [facility] ED Requesting Provider: Chesley Noon, MD  Reason for transfer: Need for surgical intervention on Monday 10/14 of periprosthetic distal femur fracture.  Facility course:  Tracey [name] is a 87 y.o. female with past medical history of hypertension, diabetes, CA bypass 1995, PAD, and atrial fibrillation who presents to the ED complaining of knee injury.  Patient reports that just prior to arrival she lost her balance going down the stairs and had her left knee bent back behind her.  She felt an immediate pop and has had significant pain with movement of that knee since then.  She denies any injuries to her other extremities and did not hit her head or lose consciousness.  She does continue to take Plavix regularly.    At presentation to ED patient found bradycardic heart rate 58, otherwise hemodynamically stable. CMP grossly unremarkable except elevated blood glucose 244 and BUN 32. CBC showing mild leukocytosis 11, stable H&H 11.5 and 35 and platelet count 149 (baseline around 131 to 165)  Knee x-ray showing: IMPRESSION: Comminuted periprosthetic fracture of the distal left femur.  In the ED patient was given morphine 4 mg once, fentanyl 50 mcg once and Zofran 4 mg once.  ED physician initially spoke with orthopedic surgeon Dr. Allena Katz at [facility] however Dr. Allena Katz recommended as patient has periprosthetic fracture need to speak with other orthopedics on-call.   Later orthopedic [name] has been consulted by ED physician who has recommended transfer to [facility] and plan for surgical repair  Monday 07/01/2023 but not before that.  Also need to admit patient under hospitalist care.  So hospitalist has been contacted for further management and care of the chronic medical condition.  Plan of  care: The patient is accepted for admission for inpatient status to medical Telemetry unit, at [facility].  Check [email or web address] for on-call coverage.  [facility] will assume care on arrival to accepting facility. Until arrival, medical decision making responsibilities remain with the EDP.  However, [facility] available 24/7 for questions and assistance.   Nursing staff please page [facility] Admits and Consults [phone]) as soon as the patient arrives to the hospital.    Author: Tereasa Coop, MD  06/29/2023  [facility]

## 2023-06-30 ENCOUNTER — Inpatient Hospital Stay (HOSPITAL_COMMUNITY): Payer: Medicare HMO

## 2023-06-30 ENCOUNTER — Inpatient Hospital Stay (HOSPITAL_COMMUNITY)
Admission: EM | Admit: 2023-06-30 | Discharge: 2023-07-04 | DRG: 481 | Disposition: A | Discharge status: Home Health | Payer: Medicare HMO | Source: Other Acute Inpatient Hospital | Attending: Student | Admitting: Student

## 2023-06-30 DIAGNOSIS — D72825 Bandemia: Secondary | ICD-10-CM | POA: Diagnosis not present

## 2023-06-30 DIAGNOSIS — E1151 Type 2 diabetes mellitus with diabetic peripheral angiopathy without gangrene: Secondary | ICD-10-CM | POA: Diagnosis present

## 2023-06-30 DIAGNOSIS — W109XXA Fall (on) (from) unspecified stairs and steps, initial encounter: Secondary | ICD-10-CM | POA: Diagnosis not present

## 2023-06-30 DIAGNOSIS — W108XXA Fall (on) (from) other stairs and steps, initial encounter: Secondary | ICD-10-CM | POA: Diagnosis present

## 2023-06-30 DIAGNOSIS — M25562 Pain in left knee: Secondary | ICD-10-CM | POA: Diagnosis not present

## 2023-06-30 DIAGNOSIS — M978XXA Periprosthetic fracture around other internal prosthetic joint, initial encounter: Principal | ICD-10-CM

## 2023-06-30 DIAGNOSIS — I129 Hypertensive chronic kidney disease with stage 1 through stage 4 chronic kidney disease, or unspecified chronic kidney disease: Secondary | ICD-10-CM | POA: Diagnosis present

## 2023-06-30 DIAGNOSIS — E785 Hyperlipidemia, unspecified: Secondary | ICD-10-CM | POA: Diagnosis present

## 2023-06-30 DIAGNOSIS — E1169 Type 2 diabetes mellitus with other specified complication: Secondary | ICD-10-CM | POA: Diagnosis not present

## 2023-06-30 DIAGNOSIS — N182 Chronic kidney disease, stage 2 (mild): Secondary | ICD-10-CM

## 2023-06-30 DIAGNOSIS — Z8249 Family history of ischemic heart disease and other diseases of the circulatory system: Secondary | ICD-10-CM

## 2023-06-30 DIAGNOSIS — Z7902 Long term (current) use of antithrombotics/antiplatelets: Secondary | ICD-10-CM

## 2023-06-30 DIAGNOSIS — I2510 Atherosclerotic heart disease of native coronary artery without angina pectoris: Secondary | ICD-10-CM | POA: Diagnosis present

## 2023-06-30 DIAGNOSIS — Z885 Allergy status to narcotic agent status: Secondary | ICD-10-CM

## 2023-06-30 DIAGNOSIS — D62 Acute posthemorrhagic anemia: Secondary | ICD-10-CM | POA: Diagnosis not present

## 2023-06-30 DIAGNOSIS — R0989 Other specified symptoms and signs involving the circulatory and respiratory systems: Secondary | ICD-10-CM | POA: Diagnosis not present

## 2023-06-30 DIAGNOSIS — E1165 Type 2 diabetes mellitus with hyperglycemia: Secondary | ICD-10-CM | POA: Diagnosis present

## 2023-06-30 DIAGNOSIS — Z8679 Personal history of other diseases of the circulatory system: Secondary | ICD-10-CM | POA: Diagnosis not present

## 2023-06-30 DIAGNOSIS — N1831 Chronic kidney disease, stage 3a: Secondary | ICD-10-CM | POA: Diagnosis present

## 2023-06-30 DIAGNOSIS — J90 Pleural effusion, not elsewhere classified: Secondary | ICD-10-CM | POA: Diagnosis not present

## 2023-06-30 DIAGNOSIS — K219 Gastro-esophageal reflux disease without esophagitis: Secondary | ICD-10-CM | POA: Diagnosis present

## 2023-06-30 DIAGNOSIS — Z87442 Personal history of urinary calculi: Secondary | ICD-10-CM | POA: Diagnosis not present

## 2023-06-30 DIAGNOSIS — R918 Other nonspecific abnormal finding of lung field: Secondary | ICD-10-CM | POA: Diagnosis not present

## 2023-06-30 DIAGNOSIS — E1122 Type 2 diabetes mellitus with diabetic chronic kidney disease: Secondary | ICD-10-CM | POA: Diagnosis not present

## 2023-06-30 DIAGNOSIS — Z955 Presence of coronary angioplasty implant and graft: Secondary | ICD-10-CM | POA: Diagnosis not present

## 2023-06-30 DIAGNOSIS — S72452A Displaced supracondylar fracture without intracondylar extension of lower end of left femur, initial encounter for closed fracture: Secondary | ICD-10-CM | POA: Diagnosis not present

## 2023-06-30 DIAGNOSIS — M9712XA Periprosthetic fracture around internal prosthetic left knee joint, initial encounter: Secondary | ICD-10-CM | POA: Diagnosis present

## 2023-06-30 DIAGNOSIS — Z7984 Long term (current) use of oral hypoglycemic drugs: Secondary | ICD-10-CM | POA: Diagnosis not present

## 2023-06-30 DIAGNOSIS — D72829 Elevated white blood cell count, unspecified: Secondary | ICD-10-CM | POA: Diagnosis not present

## 2023-06-30 DIAGNOSIS — Z9049 Acquired absence of other specified parts of digestive tract: Secondary | ICD-10-CM

## 2023-06-30 DIAGNOSIS — I10 Essential (primary) hypertension: Secondary | ICD-10-CM | POA: Diagnosis present

## 2023-06-30 DIAGNOSIS — I4891 Unspecified atrial fibrillation: Secondary | ICD-10-CM | POA: Diagnosis not present

## 2023-06-30 DIAGNOSIS — Z8349 Family history of other endocrine, nutritional and metabolic diseases: Secondary | ICD-10-CM | POA: Diagnosis not present

## 2023-06-30 DIAGNOSIS — I480 Paroxysmal atrial fibrillation: Secondary | ICD-10-CM | POA: Diagnosis not present

## 2023-06-30 DIAGNOSIS — S72352A Displaced comminuted fracture of shaft of left femur, initial encounter for closed fracture: Secondary | ICD-10-CM | POA: Diagnosis not present

## 2023-06-30 DIAGNOSIS — Z951 Presence of aortocoronary bypass graft: Secondary | ICD-10-CM | POA: Diagnosis not present

## 2023-06-30 DIAGNOSIS — D61818 Other pancytopenia: Secondary | ICD-10-CM | POA: Diagnosis not present

## 2023-06-30 DIAGNOSIS — Z881 Allergy status to other antibiotic agents status: Secondary | ICD-10-CM | POA: Diagnosis not present

## 2023-06-30 DIAGNOSIS — M85862 Other specified disorders of bone density and structure, left lower leg: Secondary | ICD-10-CM | POA: Diagnosis not present

## 2023-06-30 DIAGNOSIS — I739 Peripheral vascular disease, unspecified: Secondary | ICD-10-CM | POA: Diagnosis present

## 2023-06-30 DIAGNOSIS — Z96651 Presence of right artificial knee joint: Secondary | ICD-10-CM | POA: Diagnosis present

## 2023-06-30 DIAGNOSIS — Z96659 Presence of unspecified artificial knee joint: Secondary | ICD-10-CM

## 2023-06-30 DIAGNOSIS — S72402A Unspecified fracture of lower end of left femur, initial encounter for closed fracture: Secondary | ICD-10-CM | POA: Diagnosis not present

## 2023-06-30 DIAGNOSIS — Z79899 Other long term (current) drug therapy: Secondary | ICD-10-CM

## 2023-06-30 LAB — GLUCOSE, CAPILLARY
Glucose-Capillary: 177 mg/dL — ABNORMAL HIGH (ref 70–99)
Glucose-Capillary: 164 mg/dL — ABNORMAL HIGH (ref 70–99)
Glucose-Capillary: 139 mg/dL — ABNORMAL HIGH (ref 70–99)
Glucose-Capillary: 113 mg/dL — ABNORMAL HIGH (ref 70–99)

## 2023-06-30 LAB — CBG MONITORING, ED: Glucose-Capillary: 130 mg/dL — ABNORMAL HIGH (ref 70–99)

## 2023-06-30 MED ORDER — ONDANSETRON HCL 4 MG/2ML IJ SOLN
4.0000 mg | Freq: Four times a day (QID) | INTRAMUSCULAR | Status: DC | PRN
  Administered 2023-06-30 – 2023-07-04 (×7): 4 mg via INTRAVENOUS
  Filled 2023-06-30 (×7): qty 2

## 2023-06-30 MED ORDER — SENNOSIDES-DOCUSATE SODIUM 8.6-50 MG PO TABS
1.0000 | ORAL_TABLET | Freq: Two times a day (BID) | ORAL | Status: DC
  Administered 2023-06-30 – 2023-07-04 (×7): 1 via ORAL
  Filled 2023-06-30 (×7): qty 1

## 2023-06-30 MED ORDER — INSULIN GLARGINE-YFGN 100 UNIT/ML ~~LOC~~ SOLN
5.0000 [IU] | Freq: Every day | SUBCUTANEOUS | Status: DC
  Administered 2023-06-30 – 2023-07-04 (×5): 5 [IU] via SUBCUTANEOUS
  Filled 2023-06-30 (×5): qty 0.05

## 2023-06-30 MED ORDER — INSULIN ASPART 100 UNIT/ML IJ SOLN
0.0000 [IU] | Freq: Every day | INTRAMUSCULAR | Status: DC
  Administered 2023-07-02: 2 [IU] via SUBCUTANEOUS
  Administered 2023-07-03: 3 [IU] via SUBCUTANEOUS

## 2023-06-30 MED ORDER — ROSUVASTATIN CALCIUM 20 MG PO TABS
40.0000 mg | ORAL_TABLET | Freq: Every day | ORAL | Status: DC
  Administered 2023-06-30 – 2023-07-04 (×5): 40 mg via ORAL
  Filled 2023-06-30 (×5): qty 2

## 2023-06-30 MED ORDER — HYDROCODONE-ACETAMINOPHEN 5-325 MG PO TABS
1.0000 | ORAL_TABLET | Freq: Four times a day (QID) | ORAL | Status: DC | PRN
  Administered 2023-06-30 – 2023-07-03 (×4): 2 via ORAL
  Filled 2023-06-30 (×5): qty 2

## 2023-06-30 MED ORDER — PANTOPRAZOLE SODIUM 40 MG PO TBEC
40.0000 mg | DELAYED_RELEASE_TABLET | Freq: Every day | ORAL | Status: DC
  Administered 2023-06-30 – 2023-07-04 (×5): 40 mg via ORAL
  Filled 2023-06-30 (×5): qty 1

## 2023-06-30 MED ORDER — EZETIMIBE 10 MG PO TABS
10.0000 mg | ORAL_TABLET | Freq: Every day | ORAL | Status: DC
  Administered 2023-06-30 – 2023-07-04 (×5): 10 mg via ORAL
  Filled 2023-06-30 (×5): qty 1

## 2023-06-30 MED ORDER — INSULIN ASPART 100 UNIT/ML IJ SOLN
0.0000 [IU] | Freq: Three times a day (TID) | INTRAMUSCULAR | Status: DC
  Administered 2023-06-30: 1 [IU] via SUBCUTANEOUS
  Administered 2023-06-30: 2 [IU] via SUBCUTANEOUS
  Administered 2023-07-01: 3 [IU] via SUBCUTANEOUS
  Administered 2023-07-01: 2 [IU] via SUBCUTANEOUS
  Administered 2023-07-02: 3 [IU] via SUBCUTANEOUS
  Administered 2023-07-02 – 2023-07-03 (×4): 2 [IU] via SUBCUTANEOUS

## 2023-06-30 MED ORDER — LISINOPRIL 10 MG PO TABS
10.0000 mg | ORAL_TABLET | Freq: Every day | ORAL | Status: DC
  Administered 2023-06-30 – 2023-07-02 (×3): 10 mg via ORAL
  Filled 2023-06-30 (×3): qty 1

## 2023-06-30 MED ORDER — MORPHINE SULFATE (PF) 2 MG/ML IV SOLN: 0.5000 mg | INTRAVENOUS | Status: DC | PRN

## 2023-06-30 MED ORDER — ENOXAPARIN SODIUM 40 MG/0.4ML IJ SOSY
40.0000 mg | PREFILLED_SYRINGE | Freq: Every day | INTRAMUSCULAR | Status: DC
  Administered 2023-06-30: 40 mg via SUBCUTANEOUS
  Filled 2023-06-30: qty 0.4

## 2023-06-30 MED ORDER — MELATONIN 3 MG PO TABS: 3.0000 mg | ORAL_TABLET | Freq: Every evening | ORAL | Status: DC | PRN

## 2023-06-30 MED ORDER — CARVEDILOL 3.125 MG PO TABS
3.1250 mg | ORAL_TABLET | Freq: Every day | ORAL | Status: DC
  Administered 2023-06-30 – 2023-07-02 (×3): 3.125 mg via ORAL
  Filled 2023-06-30 (×3): qty 1

## 2023-06-30 NOTE — ED Notes (Signed)
Provided water and a pillow, pt states no other needs at this time, states "I'm very comfortable."

## 2023-06-30 NOTE — ED Notes (Signed)
PT. IS ACCEPTED AT [facility], THE ACCEPTING DOCTOR IS DR. Janalyn Shy, THE LAST REP SPOKEN TO WAS Festus Barren AT [facility] ABOUT THE TRANSFER AND SHE STATED SHE HAS NO FURTHER INFORMATION ABOUT THE BED ASSIGNMENT OR  A NUMBER FOR REPORT.

## 2023-06-30 NOTE — Consult Note (Signed)
Orthopedic Surgery Consult Note  Assessment: Patient is a 87 y.o. female with left periprosthetic distal femur fracture   Plan: -Will need operative management of this fracture -Okay for diet from orthopedic perspective, NPO at midnight -Ancef and TXA on call to OR -Weight bearing status: NWB LLE in KI -PT evaluate and treat post-operatively  -Pain control -Dispo: pending completion of operative plans  ___________________________________________________________________________   Reason for consult: left distal femur periprosthetic fracture  History:  Patient is a 87 y.o. female with history of bilateral TKA (left TKA was done in 01/06/2020 by Dr. Ernest Pine) who was walking down carpeted stairs yesterday when her left leg got twisted and she heard it pop. She was unable to weight bear and was brought to [facility]. There she was found to have a periprosthetic distal femur fracture. The practice that performed the total knee was called but said they could not manage the fracture so [facility] was called for possible transfer. Patient arrived to [facility] this AM on a medicine service. She is experiencing left knee pain but is not having any other extremity pain. Denies paresthesias and numbness.    Physical Exam:  General: no acute distress, appears stated age Neurologic: alert, answering questions appropriately, following commands Cardiovascular: regular rate, no cyanosis Respiratory: unlabored breathing on room air, symmetric chest rise Psychiatric: appropriate affect, normal cadence to speech  MSK:   -Bilateral upper extremities  No tenderness to palpation over extremity, no open wounds, no gross deformity Fires deltoid, biceps, triceps, wrist extensors, wrist flexors, finger extensors, finger flexors  AIN/PIN/IO intact  Palpable radial pulse  Sensation intact to light touch in median/ulnar/radial/axillary nerve distributions  Hand warm and well perfused  -Right lower  extremity  No tenderness to palpation over extremity, no pain with log roll, no open wounds, no gross deformity, no swelling seen Fires hip flexors, quadriceps, hamstrings, tibialis anterior, gastrocnemius and soleus, extensor hallucis longus Plantarflexes and dorsiflexes toes Sensation intact to light touch in sural, saphenous, tibial, deep peroneal, and superficial peroneal nerve distributions Foot warm and well perfused  -Left lower extremity  No tenderness to palpation over extremity, except around the knee  Swelling seen around the knee, no other swelling seen  No open wounds seen EHL/TA/GSC intact Plantarflexes and dorsiflexes toes Sensation intact to light touch in sural, saphenous, tibial, deep peroneal, and superficial peroneal nerve distributions Foot warm and well perfused  Imaging: XR of the left femur from 06/30/2023 was independently reviewed and interpreted, showing a comminuted distal femur fracture adjacent to a total knee prosthesis. The fracture is displaced with lateral translation and apex anterior on the lateral view. The prosthesis does not appear loose. No other fractures seen.   CT of the left knee from 06/30/2023 was independently reviewed and interpreted, showing comminuted, shortened, periprosthetic distal femur fracture. The fracture is displaced laterally. No other fracture seen. Total knee prosthesis appears intact.    Patient name: Tracey [name] Patient MRN: [id number] Date: 06/30/23

## 2023-06-30 NOTE — ED Notes (Signed)
EMTALA reviewed this RN, transfer consent obtained, pt ready for transport.

## 2023-06-30 NOTE — H&P (Addendum)
History and Physical    Patient: Tracey [name] FUX:[id number] DOB: August 16, 1936 DOA: 06/30/2023 DOS: the patient was seen and examined on 06/30/2023 PCP: [name], MD  Patient coming from: Transfer from [facility]  Chief Complaint: Left knee pain  HPI: Tracey [name] is a 87 y.o. female with medical history significant of hypertension, hyperlipidemia, dysrhythmia, CAD s/p CABG in 1995, peripheral vascular disease,diabetes mellitus type 2, CKD stage II, bilateral knee replacements, and arthritis who presented with complaints of left knee pain.  She states she was coming down the steps yesterday when her right foot slipped and her left knee went behind her.  She slid down 3-4 steps.  Patient notes that she immediately felt a pop in her knee and had significant pain.  She was not able to get up without assistance.  Denies any loss of consciousness or trauma to her head.  She is on Plavix, but is not on any blood thinners.  She reports pain is 6-7 on the pain scale and only worsens if she moves.  She has not had any recent fever, chills, cough, nausea, vomiting, diarrhea and has been otherwise in her normal state of health.  Records note that the patient's left knee replacement was performed back in 12/2019 by Dr. Ernest Pine.  Initially seen at [facility] emergency department she was found to be bradycardic with heart rates 58, but otherwise hemodynamically stable.  Labs significant for WBC 11, hemoglobin 11.5, platelets 149, and glucose 244.  X-rays revealed a communicated periprosthetic fracture of the distal left femur.  She had been given IV pain medications and antiemetics.  Patient was placed in knee immobilizer.  Orthopedic surgery was consulted at the facility, but recommended transfer to [facility] after talks with [name].  Plan was for patient to have surgery on 10/14.  Hospitalist service accepted in transfer.   Review of Systems: As mentioned in the history of present illness. All other systems  reviewed and are negative. Past Medical History:  Diagnosis Date   Anxiety    Arthritis    Atrial fibrillation (HCC)    history   Chronic kidney disease    ckd stage II   Coronary artery disease    Diabetes mellitus    non insulin dependent   Dysrhythmia    atrial fibrillation, history of   GERD (gastroesophageal reflux disease)    Heart murmur    History of kidney stones    Hyperlipidemia    Hypertension    Peripheral vascular disease (HCC)    Rectocele    Syncope 09/2019   UTI (urinary tract infection)    Past Surgical History:  Procedure Laterality Date   ABDOMINAL AORTA STENT     APPENDECTOMY     BREAST BIOPSY Left    20 years ago   BREAST BIOPSY Right 2008   BREAST EXCISIONAL BIOPSY     rt 2008 lt "20 years ago"   CARDIAC CATHETERIZATION     CATARACT EXTRACTION W/PHACO Left 08/15/2015   Procedure: CATARACT EXTRACTION PHACO AND INTRAOCULAR LENS PLACEMENT (IOC);  Surgeon: Sallee Lange, MD;  Location: [facility] ORS;  Service: Ophthalmology;  Laterality: Left;  [place] 01:28 AP% 26.4 CDE 35.73 fluid pack # [id number]   COLONOSCOPY WITH PROPOFOL N/A 01/16/2016   Procedure: COLONOSCOPY WITH PROPOFOL;  Surgeon: Scot Jun, MD;  Location: [facility] ENDOSCOPY;  Service: Endoscopy;  Laterality: N/A;   CORONARY ANGIOPLASTY WITH STENT PLACEMENT     CORONARY ARTERY BYPASS GRAFT  1995   with  LIMA  to the LAD, SVG to OM1, SVG to PDA   ESOPHAGOGASTRODUODENOSCOPY (EGD) WITH PROPOFOL N/A 01/16/2016   Procedure: ESOPHAGOGASTRODUODENOSCOPY (EGD) WITH PROPOFOL;  Surgeon: Scot Jun, MD;  Location: [facility] ENDOSCOPY;  Service: Endoscopy;  Laterality: N/A;   EYE SURGERY     JOINT REPLACEMENT Right    KNEE ARTHROPLASTY Left 01/06/2020   Procedure: COMPUTER ASSISTED TOTAL KNEE ARTHROPLASTY;  Surgeon: Donato Heinz, MD;  Location: [facility] ORS;  Service: Orthopedics;  Laterality: Left;   LOWER EXTREMITY ANGIOGRAPHY N/A 07/08/2019   Procedure: Abdominal Aortagraphy;  Surgeon: Annice Needy, MD;   Location: [facility] INVASIVE CV LAB;  Service: Cardiovascular;  Laterality: N/A;   RENAL ANGIOGRAPHY Left 08/26/2019   Procedure: RENAL ANGIOGRAPHY;  Surgeon: Annice Needy, MD;  Location: [facility] INVASIVE CV LAB;  Service: Cardiovascular;  Laterality: Left;   RENAL ARTERY STENT     left, By Dr Earnestine Leys   REPLACEMENT TOTAL KNEE  03/25/2013   right   TONSILLECTOMY     Social History:  reports that she has never smoked. She has never used smokeless tobacco. She reports that she does not drink alcohol and does not use drugs.  Allergies  Allergen Reactions   Codeine Nausea Only    Sick to her stomach   Erythromycin Nausea And Vomiting and Other (See Comments)   Tramadol Nausea Only and Other (See Comments)    Loss of appetite and did not eat   Ciprofloxacin Diarrhea    Upsets stomach    Family History  Problem Relation Age of Onset   Heart disease Father    Other Brother        open heart surgery   Cancer Brother    Other Brother        open heart surgery   Other Brother        open heart surgery   Cancer Daughter    Hyperlipidemia Daughter    Breast cancer Neg Hx    Kidney cancer Neg Hx    Bladder Cancer Neg Hx     Prior to Admission medications   Medication Sig Start Date End Date Taking? Authorizing Provider  acetaminophen (TYLENOL) 500 MG tablet Take 500 mg by mouth 2 (two) times daily as needed for moderate pain.    [provider]  carvedilol (COREG) 3.125 MG tablet TAKE ONE TABLET BY MOUTH ONCE DAILY 05/07/23   Antonieta Iba, MD  clopidogrel (PLAVIX) 75 MG tablet Take 1 tablet (75 mg total) by mouth daily. 06/15/22   Antonieta Iba, MD  CRANBERRY PO Take 1 tablet by mouth daily.    [provider]  ezetimibe (ZETIA) 10 MG tablet TAKE ONE TABLET BY MOUTH ONCE DAILY 05/07/23   Antonieta Iba, MD  glipiZIDE (GLUCOTROL XL) 5 MG 24 hr tablet Take 1 tablet (5 mg total) by mouth daily with breakfast. 04/01/23   [name], NP  glucose blood (ONETOUCH VERIO)  test strip Use as instructed to monitor glucose twice daily 12/20/22   [name], NP  lisinopril (ZESTRIL) 10 MG tablet TAKE ONE TABLET BY MOUTH ONCE DAILY 05/07/23   Antonieta Iba, MD  metFORMIN (GLUCOPHAGE-XR) 500 MG 24 hr tablet Take 1 tablet (500 mg total) by mouth daily. 12/20/22   [name], NP  Multiple Vitamins-Minerals (MULTIVITAMIN WITH MINERALS) tablet Take 1 tablet by mouth daily.    [provider]  nitroGLYCERIN (NITROSTAT) 0.4 MG SL tablet Place 1 tablet (0.4 mg total) under  the tongue every 5 (five) minutes as needed for chest pain. 06/15/22 08/22/37  Antonieta Iba, MD  OneTouch Delica Lancets 33G MISC USE TO TEST TWICE A DAY 03/15/20   Roma Kayser, MD  pantoprazole (PROTONIX) 40 MG tablet TAKE ONE TABLET BY MOUTH ONCE DAILY 10/31/22   Tower, Audrie Gallus, MD  rosuvastatin (CRESTOR) 40 MG tablet TAKE ONE TABLET BY MOUTH ONCE DAILY 05/07/23   Antonieta Iba, MD    Physical Exam: There were no vitals filed for this visit.   10/12 0700 10/13 0659 10/13 0700 10/13 1518  Most Recent     Temp (F)    97.6 97.8-98.1  98.1 (36.7)  10/13 0843  Pulse Rate    58 -79 65-68  68  10/13 0843  Resp    17 15-18  15  10/13 0843  BP    107/46 -142/51  144/57-145/55   144/57  10/13 0843  SpO2 (%)    94-100 94-96  94  10/13 0843  Weight (kg)  59    59 kg  10/12 155    Constitutional: Elderly female currently in no acute distress at this time Eyes: PERRL, lids and conjunctivae normal ENMT: Mucous membranes are moist. Posterior pharynx clear of any exudate or lesions.Normal dentition.  Neck: normal, supple, no masses, no thyromegaly Respiratory: clear to auscultation bilaterally, no wheezing, no crackles. Normal respiratory effort. No accessory muscle use.  Cardiovascular: Regular rate and rhythm, no murmurs / rubs / gallops. No extremity edema. 2+ pedal pulses. No carotid bruits.  Abdomen: no tenderness, no masses palpated. No hepatosplenomegaly. Bowel  sounds positive.  Musculoskeletal: no clubbing / cyanosis.  Left leg in knee immobilizer.  Skin: no rashes, lesions, ulcers. No induration Neurologic: CN 2-12 grossly intact.  Strength 5/5 in all 4.  Psychiatric: Normal judgment and insight. Alert and oriented x 3. Normal mood.   Data Reviewed:  EKG revealed normal sinus rhythm at 63 bpm with first-degree heart block and some subtle ST changes in the inferior leads.  Assessment and Plan:  Periprosthetic left distal femur fracture secondary to fall down steps Patient presents after slipping down her steps with left knee pain.  Denied any loss of consciousness or trauma to her head.  Imaging revealed left periprosthetic distal femur fracture.  Patient previously had left knee replacement back in 12/2019 by Dr. Ernest Pine. -Admit to a surgical telemetry bed -Hip fracture order set utilized -Bedrest -Orders placed for n.p.o. after midnight -Hydrocodone/morphine IV as needed for pain. -Senokot -Appreciate orthopedic consultative services we will follow-up for any further recommendations  Leukocytosis WBC elevated at 11.  Patient denied having any shortness of breath or dysuria symptoms.  Suspect secondary to above. -Recheck CBC in a.m.  Pancytopenia Chronic.  On admission hemoglobin 11.5 with platelet count 149.  Both of which appear around patient's baseline -Continue to monitor  History of dysrhythmia Patient appears to be in sinus rhythm at this time.  Review of prior cardiology records make note of prior history of SVT.  She is not on anticoagulation. -Follow-up telemetry  CAD Patient with prior history of CABG back in 1995. -Held Plavix for need of surgical procedure -Continue statin  PAD Patient noted to have significant aortic disease and renal artery disease.  Noted to have prior intervention by Dr. William Hamburger. -Continue statin -Resume Plavix once a  Essential hypertension Blood pressures maintained. -Continue Coreg and  lisinopril as tolerated  Uncontrolled diabetes mellitus type 2, without long-term use of insulin On  admission patient's glucose noted to be elevated up to 244.  Home medication regimen includes glipizide and metformin. -Hypoglycemic protocols -Hold glipizide and metformin -Semglee 5 units daily -CBGs before every meal with sensitive SSI -Adjust regimen as needed  Hyperlipidemia -Continue Zetia and Crestor  GERD  -Continue Protonix  DVT prophylaxis: Lovenox Advance Care Planning:   Code Status: Full Code   Consults: Orthopedics  Family Communication: Family updated at bedside  Severity of Illness: The appropriate patient status for this patient is INPATIENT. Inpatient status is judged to be reasonable and necessary in order to provide the required intensity of service to ensure the patient's safety. The patient's presenting symptoms, physical exam findings, and initial radiographic and laboratory data in the context of their chronic comorbidities is felt to place them at high risk for further clinical deterioration. Furthermore, it is not anticipated that the patient will be medically stable for discharge from the hospital within 2 midnights of admission.   * I certify that at the point of admission it is my clinical judgment that the patient will require inpatient hospital care spanning beyond 2 midnights from the point of admission due to high intensity of service, high risk for further deterioration and high frequency of surveillance required.*  Author: Clydie Braun, MD 06/30/2023 8:50 AM  For on call review [email or web address].

## 2023-06-30 NOTE — Plan of Care (Signed)
  Problem: Pain Managment: Goal: General experience of comfort will improve Outcome: Progressing   Problem: Safety: Goal: Ability to remain free from injury will improve Outcome: Progressing   Problem: Skin Integrity: Goal: Risk for impaired skin integrity will decrease Outcome: Progressing   Problem: Coping: Goal: Ability to adjust to condition or change in health will improve Outcome: Progressing

## 2023-06-30 NOTE — ED Notes (Signed)
Pt was boosted in bed, pad changed, Pure Wick placement verified.

## 2023-07-01 ENCOUNTER — Inpatient Hospital Stay (HOSPITAL_COMMUNITY): Payer: Self-pay | Admitting: Certified Registered"

## 2023-07-01 ENCOUNTER — Encounter (HOSPITAL_COMMUNITY): Payer: Self-pay | Admitting: Internal Medicine

## 2023-07-01 ENCOUNTER — Other Ambulatory Visit: Payer: Self-pay

## 2023-07-01 ENCOUNTER — Inpatient Hospital Stay (HOSPITAL_COMMUNITY): Payer: Medicare HMO

## 2023-07-01 ENCOUNTER — Encounter (HOSPITAL_COMMUNITY)
Admission: EM | Disposition: A | Discharge status: Home Health | Payer: Self-pay | Source: Other Acute Inpatient Hospital | Attending: Internal Medicine

## 2023-07-01 DIAGNOSIS — I2510 Atherosclerotic heart disease of native coronary artery without angina pectoris: Secondary | ICD-10-CM

## 2023-07-01 DIAGNOSIS — S72402A Unspecified fracture of lower end of left femur, initial encounter for closed fracture: Secondary | ICD-10-CM | POA: Diagnosis not present

## 2023-07-01 DIAGNOSIS — M9712XA Periprosthetic fracture around internal prosthetic left knee joint, initial encounter: Secondary | ICD-10-CM | POA: Diagnosis not present

## 2023-07-01 HISTORY — PX: ORIF FEMUR FRACTURE: SHX2119

## 2023-07-01 LAB — CBC
HCT: 26.7 % — ABNORMAL LOW (ref 36.0–46.0)
Hemoglobin: 8.8 g/dL — ABNORMAL LOW (ref 12.0–15.0)
MCH: 30 pg (ref 26.0–34.0)
MCHC: 33 g/dL (ref 30.0–36.0)
MCV: 91.1 fL (ref 80.0–100.0)
Platelets: 132 10*3/uL — ABNORMAL LOW (ref 150–400)
RBC: 2.93 MIL/uL — ABNORMAL LOW (ref 3.87–5.11)
RDW: 12.6 % (ref 11.5–15.5)
WBC: 7 10*3/uL (ref 4.0–10.5)
nRBC: 0 % (ref 0.0–0.2)

## 2023-07-01 LAB — BASIC METABOLIC PANEL
Anion gap: 8 (ref 5–15)
BUN: 29 mg/dL — ABNORMAL HIGH (ref 8–23)
CO2: 23 mmol/L (ref 22–32)
Calcium: 9.4 mg/dL (ref 8.9–10.3)
Chloride: 103 mmol/L (ref 98–111)
Creatinine, Ser: 1.12 mg/dL — ABNORMAL HIGH (ref 0.44–1.00)
GFR, Estimated: 48 mL/min — ABNORMAL LOW (ref >60)
Glucose, Bld: 138 mg/dL — ABNORMAL HIGH (ref 70–99)
Potassium: 4 mmol/L (ref 3.5–5.1)
Sodium: 134 mmol/L — ABNORMAL LOW (ref 135–145)

## 2023-07-01 LAB — GLUCOSE, CAPILLARY
Glucose-Capillary: 164 mg/dL — ABNORMAL HIGH (ref 70–99)
Glucose-Capillary: 153 mg/dL — ABNORMAL HIGH (ref 70–99)
Glucose-Capillary: 186 mg/dL — ABNORMAL HIGH (ref 70–99)
Glucose-Capillary: 215 mg/dL — ABNORMAL HIGH (ref 70–99)
Glucose-Capillary: 131 mg/dL — ABNORMAL HIGH (ref 70–99)

## 2023-07-01 LAB — PREPARE RBC (CROSSMATCH)

## 2023-07-01 SURGERY — OPEN REDUCTION INTERNAL FIXATION (ORIF) DISTAL FEMUR FRACTURE
Anesthesia: General | Site: Leg Lower | Laterality: Left

## 2023-07-01 MED ORDER — ACETAMINOPHEN 500 MG PO TABS: 1000.0000 mg | ORAL_TABLET | Freq: Once | ORAL | Status: DC | PRN

## 2023-07-01 MED ORDER — PHENYLEPHRINE 80 MCG/ML (10ML) SYRINGE FOR IV PUSH (FOR BLOOD PRESSURE SUPPORT)
PREFILLED_SYRINGE | INTRAVENOUS | Status: DC | PRN
  Administered 2023-07-01: 80 ug via INTRAVENOUS
  Administered 2023-07-01: 160 ug via INTRAVENOUS
  Administered 2023-07-01: 80 ug via INTRAVENOUS

## 2023-07-01 MED ORDER — INSULIN ASPART 100 UNIT/ML IJ SOLN: 0.0000 [IU] | INTRAMUSCULAR | Status: DC | PRN

## 2023-07-01 MED ORDER — VANCOMYCIN HCL 1000 MG IV SOLR
INTRAVENOUS | Status: AC
  Filled 2023-07-01: qty 20

## 2023-07-01 MED ORDER — PROPOFOL 10 MG/ML IV BOLUS
INTRAVENOUS | Status: DC | PRN
  Administered 2023-07-01: 70 mg via INTRAVENOUS

## 2023-07-01 MED ORDER — VANCOMYCIN HCL 1000 MG IV SOLR
INTRAVENOUS | Status: DC | PRN
  Administered 2023-07-01: 1000 mg

## 2023-07-01 MED ORDER — LIDOCAINE 2% (20 MG/ML) 5 ML SYRINGE
INTRAMUSCULAR | Status: DC | PRN
  Administered 2023-07-01: 40 mg via INTRAVENOUS

## 2023-07-01 MED ORDER — ACETAMINOPHEN 10 MG/ML IV SOLN: 1000.0000 mg | Freq: Once | INTRAVENOUS | Status: DC | PRN

## 2023-07-01 MED ORDER — LIDOCAINE 2% (20 MG/ML) 5 ML SYRINGE
INTRAMUSCULAR | Status: AC
  Filled 2023-07-01: qty 5

## 2023-07-01 MED ORDER — ACETAMINOPHEN 160 MG/5ML PO SOLN: 1000.0000 mg | Freq: Once | ORAL | Status: DC | PRN

## 2023-07-01 MED ORDER — FENTANYL CITRATE (PF) 250 MCG/5ML IJ SOLN
INTRAMUSCULAR | Status: DC | PRN
  Administered 2023-07-01 (×2): 50 ug via INTRAVENOUS

## 2023-07-01 MED ORDER — FENTANYL CITRATE (PF) 250 MCG/5ML IJ SOLN
INTRAMUSCULAR | Status: AC
  Filled 2023-07-01: qty 5

## 2023-07-01 MED ORDER — ONDANSETRON HCL 4 MG/2ML IJ SOLN
INTRAMUSCULAR | Status: AC
  Filled 2023-07-01: qty 2

## 2023-07-01 MED ORDER — FENTANYL CITRATE (PF) 100 MCG/2ML IJ SOLN
25.0000 ug | INTRAMUSCULAR | Status: DC | PRN
  Administered 2023-07-01 (×2): 50 ug via INTRAVENOUS

## 2023-07-01 MED ORDER — FENTANYL CITRATE (PF) 100 MCG/2ML IJ SOLN
INTRAMUSCULAR | Status: AC
  Filled 2023-07-01: qty 2

## 2023-07-01 MED ORDER — CHLORHEXIDINE GLUCONATE 0.12 % MT SOLN
OROMUCOSAL | Status: AC
  Administered 2023-07-01: 15 mL via OROMUCOSAL
  Filled 2023-07-01: qty 15

## 2023-07-01 MED ORDER — ORAL CARE MOUTH RINSE: 15.0000 mL | Freq: Once | OROMUCOSAL | Status: AC

## 2023-07-01 MED ORDER — ACETAMINOPHEN 10 MG/ML IV SOLN
INTRAVENOUS | Status: AC
  Filled 2023-07-01: qty 100

## 2023-07-01 MED ORDER — CEFAZOLIN SODIUM-DEXTROSE 2-4 GM/100ML-% IV SOLN
2.0000 g | Freq: Three times a day (TID) | INTRAVENOUS | Status: AC
  Administered 2023-07-01 – 2023-07-02 (×3): 2 g via INTRAVENOUS
  Filled 2023-07-01 (×3): qty 100

## 2023-07-01 MED ORDER — CEFAZOLIN SODIUM-DEXTROSE 2-3 GM-%(50ML) IV SOLR
INTRAVENOUS | Status: DC | PRN
Start: 2023-07-01 — End: 2023-07-01
  Administered 2023-07-01: 2 g via INTRAVENOUS

## 2023-07-01 MED ORDER — ROCURONIUM BROMIDE 10 MG/ML (PF) SYRINGE
PREFILLED_SYRINGE | INTRAVENOUS | Status: DC | PRN
  Administered 2023-07-01: 60 mg via INTRAVENOUS

## 2023-07-01 MED ORDER — CEFAZOLIN SODIUM 1 G IJ SOLR
INTRAMUSCULAR | Status: AC
  Filled 2023-07-01: qty 20

## 2023-07-01 MED ORDER — ROCURONIUM BROMIDE 10 MG/ML (PF) SYRINGE
PREFILLED_SYRINGE | INTRAVENOUS | Status: AC
  Filled 2023-07-01: qty 10

## 2023-07-01 MED ORDER — KETOROLAC TROMETHAMINE 15 MG/ML IJ SOLN
15.0000 mg | Freq: Four times a day (QID) | INTRAMUSCULAR | Status: AC
  Administered 2023-07-01 – 2023-07-02 (×5): 15 mg via INTRAVENOUS
  Filled 2023-07-01 (×5): qty 1

## 2023-07-01 MED ORDER — ONDANSETRON HCL 4 MG/2ML IJ SOLN
INTRAMUSCULAR | Status: DC | PRN
  Administered 2023-07-01: 4 mg via INTRAVENOUS

## 2023-07-01 MED ORDER — 0.9 % SODIUM CHLORIDE (POUR BTL) OPTIME
TOPICAL | Status: DC | PRN
  Administered 2023-07-01: 1000 mL

## 2023-07-01 MED ORDER — PROPOFOL 10 MG/ML IV BOLUS
INTRAVENOUS | Status: AC
  Filled 2023-07-01: qty 20

## 2023-07-01 MED ORDER — OXYCODONE HCL 5 MG PO TABS: 5.0000 mg | ORAL_TABLET | Freq: Once | ORAL | Status: DC | PRN

## 2023-07-01 MED ORDER — ASPIRIN 325 MG PO TABS
325.0000 mg | ORAL_TABLET | Freq: Every day | ORAL | Status: DC
  Administered 2023-07-02: 325 mg via ORAL
  Filled 2023-07-01: qty 1

## 2023-07-01 MED ORDER — CHLORHEXIDINE GLUCONATE 0.12 % MT SOLN: 15.0000 mL | Freq: Once | OROMUCOSAL | Status: AC

## 2023-07-01 MED ORDER — BACITRACIN ZINC 500 UNIT/GM EX OINT
TOPICAL_OINTMENT | CUTANEOUS | Status: AC
  Filled 2023-07-01: qty 28.35

## 2023-07-01 MED ORDER — OXYCODONE HCL 5 MG/5ML PO SOLN: 5.0000 mg | Freq: Once | ORAL | Status: DC | PRN

## 2023-07-01 MED ORDER — LACTATED RINGERS IV SOLN: INTRAVENOUS | Status: DC

## 2023-07-01 MED ORDER — SUGAMMADEX SODIUM 200 MG/2ML IV SOLN
INTRAVENOUS | Status: DC | PRN
  Administered 2023-07-01: 200 mg via INTRAVENOUS

## 2023-07-01 SURGICAL SUPPLY — 76 items
ADH SKN CLS APL DERMABOND .7 (GAUZE/BANDAGES/DRESSINGS) ×2
APL PRP STRL LF DISP 70% ISPRP (MISCELLANEOUS) ×1
BAG COUNTER SPONGE SURGICOUNT (BAG) ×1 IMPLANT
BAG SPNG CNTER NS LX DISP (BAG) ×1
BIT DRILL 4.3 (BIT) ×1
BIT DRILL 4.3X300MM (BIT) IMPLANT
BIT DRILL LONG 3.3 (BIT) IMPLANT
BIT DRILL QC 3.3X195 (BIT) IMPLANT
BLADE CLIPPER SURG (BLADE) IMPLANT
BNDG CMPR 5X4 KNIT ELC UNQ LF (GAUZE/BANDAGES/DRESSINGS) ×1
BNDG CMPR 5X6 CHSV STRCH STRL (GAUZE/BANDAGES/DRESSINGS) ×1
BNDG CMPR 6 X 5 YARDS HK CLSR (GAUZE/BANDAGES/DRESSINGS) ×1
BNDG CMPR MED 10X6 ELC LF (GAUZE/BANDAGES/DRESSINGS) ×1
BNDG COHESIVE 6X5 TAN ST LF (GAUZE/BANDAGES/DRESSINGS) ×1 IMPLANT
BNDG ELASTIC 4INX 5YD STR LF (GAUZE/BANDAGES/DRESSINGS) IMPLANT
BNDG ELASTIC 6INX 5YD STR LF (GAUZE/BANDAGES/DRESSINGS) IMPLANT
BNDG ELASTIC 6X10 VLCR STRL LF (GAUZE/BANDAGES/DRESSINGS) ×1 IMPLANT
BRUSH SCRUB EZ PLAIN DRY (MISCELLANEOUS) ×2 IMPLANT
CANISTER SUCT 3000ML PPV (MISCELLANEOUS) ×1 IMPLANT
CAP LOCK NCB (Cap) IMPLANT
CHLORAPREP W/TINT 26 (MISCELLANEOUS) ×1 IMPLANT
COVER SURGICAL LIGHT HANDLE (MISCELLANEOUS) ×1 IMPLANT
DERMABOND ADVANCED .7 DNX12 (GAUZE/BANDAGES/DRESSINGS) IMPLANT
DRAPE C-ARM 42X72 X-RAY (DRAPES) ×1 IMPLANT
DRAPE C-ARMOR (DRAPES) ×1 IMPLANT
DRAPE HALF SHEET 40X57 (DRAPES) ×2 IMPLANT
DRAPE ORTHO SPLIT 77X108 STRL (DRAPES) ×2
DRAPE SURG 17X23 STRL (DRAPES) ×1 IMPLANT
DRAPE SURG ORHT 6 SPLT 77X108 (DRAPES) ×2 IMPLANT
DRAPE U-SHAPE 47X51 STRL (DRAPES) ×1 IMPLANT
DRSG ADAPTIC 3X8 NADH LF (GAUZE/BANDAGES/DRESSINGS) IMPLANT
DRSG MEPILEX POST OP 4X12 (GAUZE/BANDAGES/DRESSINGS) IMPLANT
DRSG MEPILEX POST OP 4X8 (GAUZE/BANDAGES/DRESSINGS) IMPLANT
DRSG MEPILEX SACRM 8.7X9.8 (GAUZE/BANDAGES/DRESSINGS) IMPLANT
ELECT REM PT RETURN 9FT ADLT (ELECTROSURGICAL) ×1
ELECTRODE REM PT RTRN 9FT ADLT (ELECTROSURGICAL) ×1 IMPLANT
GAUZE PAD ABD 8X10 STRL (GAUZE/BANDAGES/DRESSINGS) ×3 IMPLANT
GAUZE SPONGE 4X4 12PLY STRL (GAUZE/BANDAGES/DRESSINGS) ×1 IMPLANT
GLOVE BIO SURGEON STRL SZ 6.5 (GLOVE) ×3 IMPLANT
GLOVE BIO SURGEON STRL SZ7.5 (GLOVE) ×4 IMPLANT
GLOVE BIOGEL PI IND STRL 6.5 (GLOVE) ×1 IMPLANT
GLOVE BIOGEL PI IND STRL 7.5 (GLOVE) ×1 IMPLANT
GOWN STRL REUS W/ TWL LRG LVL3 (GOWN DISPOSABLE) ×3 IMPLANT
GOWN STRL REUS W/TWL LRG LVL3 (GOWN DISPOSABLE) ×3
K-WIRE 2.0 (WIRE) ×1
K-WIRE FXSTD 280X2XNS SS (WIRE) ×1
KIT BASIN OR (CUSTOM PROCEDURE TRAY) ×1 IMPLANT
KIT TURNOVER KIT B (KITS) ×1 IMPLANT
KWIRE FXSTD 280X2XNS SS (WIRE) IMPLANT
NS IRRIG 1000ML POUR BTL (IV SOLUTION) ×1 IMPLANT
PACK TOTAL JOINT (CUSTOM PROCEDURE TRAY) ×1 IMPLANT
PAD ARMBOARD 7.5X6 YLW CONV (MISCELLANEOUS) ×2 IMPLANT
PAD CAST 4YDX4 CTTN HI CHSV (CAST SUPPLIES) ×1 IMPLANT
PADDING CAST COTTON 4X4 STRL (CAST SUPPLIES) ×1
PADDING CAST COTTON 6X4 STRL (CAST SUPPLIES) ×1 IMPLANT
PLATE DIST FEM 12H (Plate) IMPLANT
SCREW 5.0 70MM (Screw) IMPLANT
SCREW NCB 3.5X75X5X6.2XST (Screw) IMPLANT
SCREW NCB 4.0X36MM (Screw) IMPLANT
SCREW NCB 5.0X36MM (Screw) IMPLANT
SCREW NCB 5.0X75MM (Screw) ×4 IMPLANT
SPONGE T-LAP 18X18 ~~LOC~~+RFID (SPONGE) IMPLANT
STAPLER VISISTAT 35W (STAPLE) ×1 IMPLANT
SUCTION TUBE FRAZIER 10FR DISP (SUCTIONS) ×1 IMPLANT
SUT ETHILON 3 0 PS 1 (SUTURE) ×2 IMPLANT
SUT MNCRL AB 3-0 PS2 18 (SUTURE) IMPLANT
SUT MON AB 2-0 CT1 36 (SUTURE) IMPLANT
SUT VIC AB 0 CT1 27 (SUTURE)
SUT VIC AB 0 CT1 27XBRD ANBCTR (SUTURE) IMPLANT
SUT VIC AB 1 CT1 27 (SUTURE)
SUT VIC AB 1 CT1 27XBRD ANBCTR (SUTURE) IMPLANT
SUT VIC AB 2-0 CT1 27 (SUTURE) ×2
SUT VIC AB 2-0 CT1 TAPERPNT 27 (SUTURE) ×2 IMPLANT
TOWEL GREEN STERILE (TOWEL DISPOSABLE) ×2 IMPLANT
TRAY FOLEY MTR SLVR 16FR STAT (SET/KITS/TRAYS/PACK) IMPLANT
WATER STERILE IRR 1000ML POUR (IV SOLUTION) ×2 IMPLANT

## 2023-07-01 NOTE — Interval H&P Note (Signed)
History and Physical Interval Note:  07/01/2023 7:32 AM  Tracey [name]  has presented today for surgery, with the diagnosis of Left distal femur fracture.  The various methods of treatment have been discussed with the patient and family. After consideration of risks, benefits and other options for treatment, the patient has consented to  Procedure(s): OPEN REDUCTION INTERNAL FIXATION (ORIF) DISTAL FEMUR FRACTURE (Left) as a surgical intervention.  The patient's history has been reviewed, patient examined, no change in status, stable for surgery.  I have reviewed the patient's chart and labs.  Questions were answered to the patient's satisfaction.     Caryn Bee P Caley Volkert

## 2023-07-01 NOTE — Progress Notes (Signed)
PROGRESS NOTE  Tracey [name] OVF:[id number] DOB: Jun 26, 1936 DOA: 06/30/2023 PCP: [name], MD   LOS: 1 day   Brief Narrative / Interim history: 87 year old female with HTN, HLD, CAD status post CABG 1995, PVD, DM 2, CKD 2, bilateral knee replacements and arthritis comes into the hospital with left knee pain.  She had a fall while going down the steps and slid down.  She immediately felt a pop in her knee and had significant pain, was unable to get up.  There is no loss of consciousness.  She is on Plavix.  X-rays revealed a comminuted periprosthetic fracture of the distal left femur.  She was admitted to the hospital and orthopedic surgery was consulted.  Subjective / 24h Interval events: Seen postoperatively in the room, multiple family members present.  Complains of slight nausea.  Denies any chest pain, denies any shortness of breath.  No significant pain present  Assesement and Plan: Principal Problem:   Periprosthetic fracture around prosthetic knee Active Problems:   Fall down steps   Leukocytosis   Pancytopenia (HCC)   History of cardiac dysrhythmia   Status post aorto-coronary artery bypass graft   PAD (peripheral artery disease) (HCC)   Essential hypertension, benign   Type 2 diabetes mellitus with stage 2 chronic kidney disease, without long-term current use of insulin (HCC)   Hyperlipidemia associated with type 2 diabetes mellitus (HCC)   GERD (gastroesophageal reflux disease)  Principal problem Periprosthetic left distal femur fracture secondary to fall -no loss of consciousness.  Imaging on admission revealed left periprosthetic distal femur fracture.  She has a history of left knee replacement back in April 2021.  Orthopedic surgery consulted, she was taken to the OR on 10/14 and is status post ORIF of left supracondylar distal femur fracture -Closely monitor postoperatively, monitor hemoglobin, PT/OT consult postop tomorrow -Appreciate orthopedic surgery  consultation -DVT prophylaxis per orthopedic surgery  Active problems Leukocytosis-likely reactive in the setting of fracture.  Monitor.  Afebrile  Pancytopenia-chronic, mild anemia with hemoglobin 8.8 this morning and platelets 132.  Closely monitor  History of SVT-appears to be in sinus rhythm.  Keep on telemetry for now  Coronary artery disease-history of CABG in 1995.  Hold Plavix for now.  Resume when cleared by orthopedic surgery  PAD-continue statin, resume Plavix postop per Ortho  Essential hypertension-on Coreg and lisinopril  Hyperlipidemia-continue statin  DM 2 -A1c 8.1 which is appropriate in her age group.  Continue sliding scale  Lab Results  Component Value Date   HGBA1C 8.1 (A) 04/01/2023   CBG (last 3)  Recent Labs    06/30/23 1609 06/30/23 2125 07/01/23 0707  GLUCAP 139* 113* 164*     Scheduled Meds:  [MAR Hold] carvedilol  3.125 mg Oral Daily   [MAR Hold] enoxaparin (LOVENOX) injection  40 mg Subcutaneous Daily   [MAR Hold] ezetimibe  10 mg Oral Daily   [MAR Hold] insulin aspart  0-5 Units Subcutaneous QHS   [MAR Hold] insulin aspart  0-9 Units Subcutaneous TID WC   [MAR Hold] insulin glargine-yfgn  5 Units Subcutaneous Daily   [MAR Hold] lisinopril  10 mg Oral Daily   [MAR Hold] pantoprazole  40 mg Oral Daily   [MAR Hold] rosuvastatin  40 mg Oral Daily   [MAR Hold] senna-docusate  1 tablet Oral BID   Continuous Infusions:  lactated ringers     PRN Meds:.0.9 % irrigation (POUR BTL), [MAR Hold] HYDROcodone-acetaminophen, insulin aspart, [MAR Hold] melatonin, [MAR Hold]  morphine injection, [MAR  Hold] ondansetron (ZOFRAN) IV  Current Outpatient Medications  Medication Instructions   acetaminophen (TYLENOL) 500 mg, Oral, 2 times daily PRN   carvedilol (COREG) 3.125 mg, Oral, Daily   clopidogrel (PLAVIX) 75 mg, Oral, Daily   CRANBERRY PO 1 tablet, Oral, Daily   ezetimibe (ZETIA) 10 mg, Oral, Daily   glipiZIDE (GLUCOTROL XL) 5 mg, Oral, Daily  with breakfast   glucose blood (ONETOUCH VERIO) test strip Use as instructed to monitor glucose twice daily   lisinopril (ZESTRIL) 10 mg, Oral, Daily   metFORMIN (GLUCOPHAGE-XR) 500 mg, Oral, Daily   Multiple Vitamins-Minerals (MULTIVITAMIN WITH MINERALS) tablet 1 tablet, Oral, Daily   nitroGLYCERIN (NITROSTAT) 0.4 mg, Sublingual, Every 5 min PRN   OneTouch Delica Lancets 33G MISC USE TO TEST TWICE A DAY   pantoprazole (PROTONIX) 40 mg, Oral, Daily   rosuvastatin (CRESTOR) 40 mg, Oral, Daily    Diet Orders (From admission, onward)     Start     Ordered   07/01/23 0001  Diet NPO time specified  Diet effective midnight        06/30/23 1729            DVT prophylaxis: enoxaparin (LOVENOX) injection 40 mg Start: 06/30/23 1000   Lab Results  Component Value Date   PLT 132 (L) 07/01/2023      Code Status: Full Code  Family Communication: Multiple family members present at the bedside  Status is: Inpatient Remains inpatient appropriate because: severity of illness   Level of care: Telemetry Medical  Consultants:  Orthopedic surgery   Objective: Vitals:   06/30/23 2025 07/01/23 0532 07/01/23 0700  BP: (!) 112/47 (!) 161/72 (!) 124/56  Pulse: 64 (!) 102 79  Resp: 18  17  Temp: 98.2 F (36.8 C)  98.7 F (37.1 C)  TempSrc: Oral  Oral  SpO2: 96% 96% 93%  Weight:   58.1 kg  Height:   5\' 3"  (1.6 m)   No intake or output data in the 24 hours ending 07/01/23 0843 Wt Readings from Last 3 Encounters:  07/01/23 58.1 kg  06/29/23 59 kg  04/01/23 59 kg    Examination:  Constitutional: NAD Eyes: no scleral icterus ENMT: Mucous membranes are moist.  Neck: normal, supple Respiratory: clear to auscultation bilaterally, no wheezing, no crackles. Normal respiratory effort. No accessory muscle use.  Cardiovascular: Regular rate and rhythm, no murmurs / rubs / gallops. No LE edema.  Abdomen: non distended, no tenderness. Bowel sounds positive.    Data Reviewed: I have  independently reviewed following labs and imaging studies   CBC Recent Labs  Lab 06/29/23 1555 07/01/23 0454  WBC 11.0* 7.0  HGB 11.5* 8.8*  HCT 35.2* 26.7*  PLT 149* 132*  MCV 91.7 91.1  MCH 29.9 30.0  MCHC 32.7 33.0  RDW 12.2 12.6    Recent Labs  Lab 06/29/23 1555 07/01/23 0454  NA 136 134*  K 4.6 4.0  CL 104 103  CO2 23 23  GLUCOSE 244* 138*  BUN 32* 29*  CREATININE 0.96 1.12*  CALCIUM 10.3 9.4  AST 37  --   ALT 30  --   ALKPHOS 51  --   BILITOT 0.9  --   ALBUMIN 4.3  --   INR 1.1  --     ------------------------------------------------------------------------------------------------------------------ No results for input(s): "CHOL", "HDL", "LDLCALC", "TRIG", "CHOLHDL", "LDLDIRECT" in the last 72 hours.  Lab Results  Component Value Date   HGBA1C 8.1 (A) 04/01/2023   ------------------------------------------------------------------------------------------------------------------ No results for  input(s): "TSH", "T4TOTAL", "T3FREE", "THYROIDAB" in the last 72 hours.  Invalid input(s): "FREET3"  Cardiac Enzymes No results for input(s): "CKMB", "TROPONINI", "MYOGLOBIN" in the last 168 hours.  Invalid input(s): "CK" ------------------------------------------------------------------------------------------------------------------ No results found for: "BNP"  CBG: Recent Labs  Lab 06/30/23 0919 06/30/23 1112 06/30/23 1609 06/30/23 2125 07/01/23 0707  GLUCAP 177* 164* 139* 113* 164*    No results found for this or any previous visit (from the past 240 hour(s)).   Radiology Studies: CT KNEE LEFT WO CONTRAST  Result Date: 06/30/2023 CLINICAL DATA:  Distal left femur periprosthetic fracture. Surgical planning. EXAM: CT OF THE LEFT KNEE WITHOUT CONTRAST TECHNIQUE: Multidetector CT imaging of the left knee was performed according to the standard protocol. Multiplanar CT image reconstructions were also generated. RADIATION DOSE REDUCTION: This exam was  performed according to the departmental dose-optimization program which includes automated exposure control, adjustment of the mA and/or kV according to patient size and/or use of iterative reconstruction technique. COMPARISON:  Left femur x-rays from same day. Left knee x-rays from yesterday. FINDINGS: Bones/Joint/Cartilage Acute comminuted fracture of the distal femoral metadiaphysis again noted with up to 1.4 cm lateral displacement, 4 mm posterior displacement, and 1.2 cm impaction. Prior total knee arthroplasty. No evidence of hardware failure or loosening. Small hemarthrosis. Osteopenia. Ligaments Ligaments are suboptimally evaluated by CT. Muscles and Tendons Grossly intact. Soft tissue No fluid collection or hematoma.  No soft tissue mass. IMPRESSION: 1. Acute comminuted displaced and impacted periprosthetic fracture of the distal femoral metadiaphysis. Electronically Signed   By: Obie Dredge M.D.   On: 06/30/2023 15:07   DG FEMUR MIN 2 VIEWS LEFT  Result Date: 06/30/2023 CLINICAL DATA:  Periprosthetic fracture knee EXAM: LEFT FEMUR 2 VIEWS COMPARISON:  CT 06/11/2019 FINDINGS: Pelvic enthesopathy. Early right hip marginal spurring. Comminuted fracture distal left femoral metadiaphyseal region above the femoral component of knee arthroplasty. Distal fracture fragments displaced laterally up to 1.5 cm. IMPRESSION: Comminuted, displaced distal left femoral metadiaphyseal fracture. Electronically Signed   By: Corlis Leak M.D.   On: 06/30/2023 13:47   Chest Portable 1 View  Result Date: 06/30/2023 CLINICAL DATA:  Periprosthetic knee fracture EXAM: PORTABLE CHEST - 1 VIEW COMPARISON:  12/20/2021 FINDINGS: Coarse perihilar interstitial prominence, increased since previous. Patchy bibasilar interstitial and alveolar opacities left greater than right, probably emphasized by lower lung volumes. Heart size and mediastinal contours are within normal limits. Aortic Atherosclerosis (ICD10-170.0). Post CABG.  Blunting of left lateral costophrenic angle suggesting small effusion. Sternotomy wires. IMPRESSION: 1. Low lung volumes with perihilar and bibasilar opacities suggesting edema and/or infiltrate. 2. Small left effusion. Electronically Signed   By: Corlis Leak M.D.   On: 06/30/2023 13:46     Pamella Pert, MD, PhD [facility]  Between 7 am - 7 pm I am available, please contact me via Amion (for emergencies) or Securechat (non urgent messages)  Between 7 pm - 7 am I am not available, please contact night coverage MD/APP via Amion

## 2023-07-01 NOTE — Consult Note (Signed)
Orthopaedic Trauma Service (OTS) Consult   Patient ID: Tracey [name] MRN: [id number] DOB/AGE: 10-22-1935 87 y.o.  Reason for Consult:Left distal femur fracture Referring Physician: [name], MD [name]  HPI: Tracey [name] is an 87 y.o. female who is being seen in consultation at the request of [name] for evaluation of left distal femur fracture.  Patient was coming down the steps and slid having her left knee go behind her sustaining a left periprosthetic distal femur fracture.  Due to the unstable nature of her injury it was felt that orthopedic traumatology was required to provide definitive care.  She was transferred from [facility].  Patient was seen and evaluated in preoperative holding area.  Her husband is at bedside.  Currently having pain.  Denies any injuries anywhere else.  Denies any history of diabetes but does have some heart history.  She has bilateral total knee replacements.  She states that she has not had any problems with her previous knee replacement.  She does take Plavix but denies any other blood thinners.  Past Medical History:  Diagnosis Date   Anxiety    Arthritis    Atrial fibrillation (HCC)    history   Chronic kidney disease    ckd stage II   Coronary artery disease    Diabetes mellitus    non insulin dependent   Dysrhythmia    atrial fibrillation, history of   GERD (gastroesophageal reflux disease)    Heart murmur    History of kidney stones    Hyperlipidemia    Hypertension    Peripheral vascular disease (HCC)    Rectocele    Syncope 09/2019   UTI (urinary tract infection)     Past Surgical History:  Procedure Laterality Date   ABDOMINAL AORTA STENT     APPENDECTOMY     BREAST BIOPSY Left    20 years ago   BREAST BIOPSY Right 2008   BREAST EXCISIONAL BIOPSY     rt 2008 lt "20 years ago"   CARDIAC CATHETERIZATION     CATARACT EXTRACTION W/PHACO Left 08/15/2015   Procedure: CATARACT EXTRACTION PHACO AND INTRAOCULAR LENS  PLACEMENT (IOC);  Surgeon: Sallee Lange, MD;  Location: [facility] ORS;  Service: Ophthalmology;  Laterality: Left;  [place] 01:28 AP% 26.4 CDE 35.73 fluid pack # [id number]   COLONOSCOPY WITH PROPOFOL N/A 01/16/2016   Procedure: COLONOSCOPY WITH PROPOFOL;  Surgeon: Scot Jun, MD;  Location: [facility] ENDOSCOPY;  Service: Endoscopy;  Laterality: N/A;   CORONARY ANGIOPLASTY WITH STENT PLACEMENT     CORONARY ARTERY BYPASS GRAFT  1995   with LIMA  to the LAD, SVG to OM1, SVG to PDA   ESOPHAGOGASTRODUODENOSCOPY (EGD) WITH PROPOFOL N/A 01/16/2016   Procedure: ESOPHAGOGASTRODUODENOSCOPY (EGD) WITH PROPOFOL;  Surgeon: Scot Jun, MD;  Location: [facility] ENDOSCOPY;  Service: Endoscopy;  Laterality: N/A;   EYE SURGERY     JOINT REPLACEMENT Right    KNEE ARTHROPLASTY Left 01/06/2020   Procedure: COMPUTER ASSISTED TOTAL KNEE ARTHROPLASTY;  Surgeon: Donato Heinz, MD;  Location: [facility] ORS;  Service: Orthopedics;  Laterality: Left;   LOWER EXTREMITY ANGIOGRAPHY N/A 07/08/2019   Procedure: Abdominal Aortagraphy;  Surgeon: Annice Needy, MD;  Location: [facility] INVASIVE CV LAB;  Service: Cardiovascular;  Laterality: N/A;   RENAL ANGIOGRAPHY Left 08/26/2019   Procedure: RENAL ANGIOGRAPHY;  Surgeon: Annice Needy, MD;  Location: [facility] INVASIVE CV LAB;  Service: Cardiovascular;  Laterality: Left;   RENAL ARTERY STENT     left,  By Dr Earnestine Leys   REPLACEMENT TOTAL KNEE  03/25/2013   right   TONSILLECTOMY      Family History  Problem Relation Age of Onset   Heart disease Father    Other Brother        open heart surgery   Cancer Brother    Other Brother        open heart surgery   Other Brother        open heart surgery   Cancer Daughter    Hyperlipidemia Daughter    Breast cancer Neg Hx    Kidney cancer Neg Hx    Bladder Cancer Neg Hx     Social History:  reports that she has never smoked. She has never used smokeless tobacco. She reports that she does not drink alcohol and does not use drugs.  Allergies:   Allergies  Allergen Reactions   Codeine Nausea Only    Sick to her stomach   Erythromycin Nausea And Vomiting and Other (See Comments)   Tramadol Nausea Only and Other (See Comments)    Loss of appetite and did not eat   Ciprofloxacin Diarrhea    Upsets stomach    Medications:  No current facility-administered medications on file prior to encounter.   Current Outpatient Medications on File Prior to Encounter  Medication Sig Dispense Refill   acetaminophen (TYLENOL) 500 MG tablet Take 500 mg by mouth 2 (two) times daily as needed for moderate pain.     carvedilol (COREG) 3.125 MG tablet TAKE ONE TABLET BY MOUTH ONCE DAILY 90 tablet 3   clopidogrel (PLAVIX) 75 MG tablet Take 1 tablet (75 mg total) by mouth daily. 90 tablet 3   CRANBERRY PO Take 1 tablet by mouth daily.     ezetimibe (ZETIA) 10 MG tablet TAKE ONE TABLET BY MOUTH ONCE DAILY 90 tablet 3   glipiZIDE (GLUCOTROL XL) 5 MG 24 hr tablet Take 1 tablet (5 mg total) by mouth daily with breakfast. 90 tablet 1   glucose blood (ONETOUCH VERIO) test strip Use as instructed to monitor glucose twice daily 100 each 3   lisinopril (ZESTRIL) 10 MG tablet TAKE ONE TABLET BY MOUTH ONCE DAILY 90 tablet 3   metFORMIN (GLUCOPHAGE-XR) 500 MG 24 hr tablet Take 1 tablet (500 mg total) by mouth daily. 90 tablet 3   Multiple Vitamins-Minerals (MULTIVITAMIN WITH MINERALS) tablet Take 1 tablet by mouth daily.     nitroGLYCERIN (NITROSTAT) 0.4 MG SL tablet Place 1 tablet (0.4 mg total) under the tongue every 5 (five) minutes as needed for chest pain. 25 tablet 3   OneTouch Delica Lancets 33G MISC USE TO TEST TWICE A DAY 100 each 1   pantoprazole (PROTONIX) 40 MG tablet TAKE ONE TABLET BY MOUTH ONCE DAILY 90 tablet 0   rosuvastatin (CRESTOR) 40 MG tablet TAKE ONE TABLET BY MOUTH ONCE DAILY 90 tablet 3     ROS: Constitutional: No fever or chills Vision: No changes in vision ENT: No difficulty swallowing CV: No chest pain Pulm: No SOB or  wheezing GI: No nausea or vomiting GU: No urgency or inability to hold urine Skin: No poor wound healing Neurologic: No numbness or tingling Psychiatric: No depression or anxiety Heme: No bruising Allergic: No reaction to medications or food   Exam: Blood pressure (!) 124/56, pulse 79, temperature 98.7 F (37.1 C), temperature source Oral, resp. rate 17, height 5\' 3"  (1.6 m), weight 58.1 kg, SpO2 93%. General: No acute distress Orientation: Awake alert and  oriented x 3 Mood and Affect: Cooperative and pleasant Gait: Unable to assess due to her fracture Coordination and balance: Within normal limits   Left lower extremity: Knee immobilizer is in place.  Obvious deformity through the lower part of her knee.  She has compartments are soft compressible.  She has active dorsiflexion plantarflexion of foot and ankle.  She is warm well-perfused foot.  She has brisk cap refill less than 2 seconds.  Right Lower extremity: Skin without lesions. No tenderness to palpation. Full painless ROM, full strength in each muscle groups without evidence of instability.   Medical Decision Making: Data: Imaging: X-rays and CT scan are reviewed which shows a periprosthetic femur fracture.  Implant appears to be stable.  Labs:  Results for orders placed or performed during the hospital encounter of 06/30/23 (from the past 24 hour(s))  Glucose, capillary     Status: Abnormal   Collection Time: 06/30/23  9:19 AM  Result Value Ref Range   Glucose-Capillary 177 (H) 70 - 99 mg/dL  Glucose, capillary     Status: Abnormal   Collection Time: 06/30/23 11:12 AM  Result Value Ref Range   Glucose-Capillary 164 (H) 70 - 99 mg/dL  Glucose, capillary     Status: Abnormal   Collection Time: 06/30/23  4:09 PM  Result Value Ref Range   Glucose-Capillary 139 (H) 70 - 99 mg/dL  Glucose, capillary     Status: Abnormal   Collection Time: 06/30/23  9:25 PM  Result Value Ref Range   Glucose-Capillary 113 (H) 70 - 99  mg/dL  Basic metabolic panel     Status: Abnormal   Collection Time: 07/01/23  4:54 AM  Result Value Ref Range   Sodium 134 (L) 135 - 145 mmol/L   Potassium 4.0 3.5 - 5.1 mmol/L   Chloride 103 98 - 111 mmol/L   CO2 23 22 - 32 mmol/L   Glucose, Bld 138 (H) 70 - 99 mg/dL   BUN 29 (H) 8 - 23 mg/dL   Creatinine, Ser 6.29 (H) 0.44 - 1.00 mg/dL   Calcium 9.4 8.9 - 52.8 mg/dL   GFR, Estimated 48 (L) >60 mL/min   Anion gap 8 5 - 15  CBC     Status: Abnormal   Collection Time: 07/01/23  4:54 AM  Result Value Ref Range   WBC 7.0 4.0 - 10.5 K/uL   RBC 2.93 (L) 3.87 - 5.11 MIL/uL   Hemoglobin 8.8 (L) 12.0 - 15.0 g/dL   HCT 41.3 (L) 24.4 - 01.0 %   MCV 91.1 80.0 - 100.0 fL   MCH 30.0 26.0 - 34.0 pg   MCHC 33.0 30.0 - 36.0 g/dL   RDW 27.2 53.6 - 64.4 %   Platelets 132 (L) 150 - 400 K/uL   nRBC 0.0 0.0 - 0.2 %  Glucose, capillary     Status: Abnormal   Collection Time: 07/01/23  7:07 AM  Result Value Ref Range   Glucose-Capillary 164 (H) 70 - 99 mg/dL     Imaging or Labs ordered: None  Medical history and chart was reviewed and case discussed with medical provider.  Assessment/Plan: 87 year old female who presents with a left periprosthetic distal femur fracture.  Due to the unstable nature of her injury I recommend proceeding with open reduction internal fixation.  Risks and benefits were discussed with the patient and her husband.  Risks included but not limited to bleeding, infection, malunion, nonunion, hardware failure, hardware irritation, nerve and blood vessel injury, knee stiffness,  even the possibility anesthetic complications.  They agreed to proceed with surgery and consent was obtained.  Roby Lofts, MD [facility] [phone] (office) [email or web address]

## 2023-07-01 NOTE — Anesthesia Preprocedure Evaluation (Signed)
Anesthesia Evaluation   Patient awake    Reviewed: Allergy & Precautions, NPO status , Patient's Chart, lab work & pertinent test results  History of Anesthesia Complications Negative for: history of anesthetic complications  Airway Mallampati: I  TM Distance: >3 FB Neck ROM: Full    Dental  (+) Edentulous Upper, Missing, Dental Advisory Given,    Pulmonary shortness of breath and with exertion, neg sleep apnea, neg COPD, neg recent URI   breath sounds clear to auscultation       Cardiovascular hypertension, Pt. on medications and Pt. on home beta blockers + angina  + CAD, + Cardiac Stents, + CABG and + Peripheral Vascular Disease  + dysrhythmias  Rhythm:Regular     Neuro/Psych negative neurological ROS  negative psych ROS   GI/Hepatic Neg liver ROS,GERD  Medicated and Controlled,,  Endo/Other  diabetes  Lab Results      Component                Value               Date                      HGBA1C                   8.1 (A)             04/01/2023             Renal/GU CRFRenal diseaseLab Results      Component                Value               Date                      NA                       134 (L)             07/01/2023                K                        4.0                 07/01/2023                CO2                      23                  07/01/2023                GLUCOSE                  138 (H)             07/01/2023                BUN                      29 (H)              07/01/2023                CREATININE               1.12 (H)  07/01/2023                CALCIUM                  9.4                 07/01/2023                GFR                      49.45 (L)           11/13/2022                GFRNONAA                 48 (L)              07/01/2023                   Musculoskeletal  (+) Arthritis ,  Left femur fx   Abdominal   Peds  Hematology  (+) Blood dyscrasia, anemia Lab  Results      Component                Value               Date                      WBC                      7.0                 07/01/2023                HGB                      8.8 (L)             07/01/2023                HCT                      26.7 (L)            07/01/2023                MCV                      91.1                07/01/2023                PLT                      132 (L)             07/01/2023            plavix   Anesthesia Other Findings   Reproductive/Obstetrics                             Anesthesia Physical Anesthesia Plan  ASA: 4  Anesthesia Plan: General   Post-op Pain Management: Ofirmev IV (intra-op)*   Induction: Intravenous  PONV Risk Score and Plan: 4 or greater and Ondansetron and Dexamethasone  Airway Management Planned: Oral ETT  Additional Equipment: None  Intra-op Plan:   Post-operative  Plan: Extubation in OR  Informed Consent: I have reviewed the patients History and Physical, chart, labs and discussed the procedure including the risks, benefits and alternatives for the proposed anesthesia with the patient or authorized representative who has indicated his/her understanding and acceptance.     Dental advisory given  Plan Discussed with: CRNA  Anesthesia Plan Comments:         Anesthesia Quick Evaluation

## 2023-07-01 NOTE — Transfer of Care (Signed)
Immediate Anesthesia Transfer of Care Note  Patient: Tracey [name]  Procedure(s) Performed: OPEN REDUCTION INTERNAL FIXATION (ORIF) DISTAL FEMUR FRACTURE (Left: Leg Lower)  Patient Location: PACU  Anesthesia Type:General  Level of Consciousness: drowsy and patient cooperative  Airway & Oxygen Therapy: Patient Spontanous Breathing and Patient connected to nasal cannula oxygen  Post-op Assessment: Report given to RN and Post -op Vital signs reviewed and stable  Post vital signs: Reviewed and stable  Last Vitals:  Vitals Value Taken Time  BP 167/84 07/01/23 0900  Temp 37.1 C 07/01/23 0900  Pulse 79 07/01/23 0915  Resp 15 07/01/23 0915  SpO2 88 % 07/01/23 0915  Vitals shown include unfiled device data.  Last Pain:  Vitals:   07/01/23 0900  TempSrc:   PainSc: 9       Patients Stated Pain Goal: 1 (07/01/23 0651)  Complications: No notable events documented.

## 2023-07-01 NOTE — Evaluation (Signed)
Occupational Therapy Evaluation Patient Details Name: Tracey [name] MRN: [id number] DOB: 12/12/1935 Today's Date: 07/01/2023   History of Present Illness 87 y.o. female with medical history significant of hypertension, hyperlipidemia, dysrhythmia, CAD s/p CABG in 1995, peripheral vascular disease,diabetes mellitus type 2, CKD stage II, bilateral knee replacements presented 10/13 s/p fall and Periprosthetic left distal femur fracture.  S/p ORIF with WBAT and no ROM restrictions.   Clinical Impression   Patient admitted for the diagnosis above.  PTA she lives at home with her spouse, and did not need any assist with AD,iADL, or mobility.  Patient has very little discomfort to her L knee, and moved to and from the [facility] with Min A and RW.  Lower body ADL closer to Mod A, but anticipate her improving quickly.  OT will follow in the acute setting and she is hoping for a return home with [facility] PT.  I'm not sure HH OT will be needed.         If plan is discharge home, recommend the following: Assist for transportation;Assistance with cooking/housework;A little help with bathing/dressing/bathroom;A little help with walking and/or transfers    Functional Status Assessment  Patient has had a recent decline in their functional status and demonstrates the ability to make significant improvements in function in a reasonable and predictable amount of time.  Equipment Recommendations  None recommended by OT    Recommendations for Other Services       Precautions / Restrictions Precautions Precautions: Fall Restrictions Weight Bearing Restrictions: Yes LLE Weight Bearing: Weight bearing as tolerated      Mobility Bed Mobility Overal bed mobility: Needs Assistance Bed Mobility: Supine to Sit, Sit to Supine     Supine to sit: Min assist, HOB elevated Sit to supine: Min assist     Patient Response: Cooperative  Transfers Overall transfer level: Needs assistance Equipment used: Rolling walker  (2 wheels) Transfers: Sit to/from Stand, Bed to chair/wheelchair/BSC Sit to Stand: Min assist     Step pivot transfers: Min assist            Balance Overall balance assessment: Needs assistance Sitting-balance support: Feet supported Sitting balance-Leahy Scale: Good     Standing balance support: Reliant on assistive device for balance Standing balance-Leahy Scale: Fair                             ADL either performed or assessed with clinical judgement   ADL       Grooming: Set up;Sitting               Lower Body Dressing: Minimal assistance;Moderate assistance;Sit to/from stand   Toilet Transfer: Minimal assistance;Rolling walker (2 wheels);Stand-pivot;BSC/3in1                   Vision Patient Visual Report: No change from baseline       Perception Perception: Within Functional Limits       Praxis Praxis: WFL       Pertinent Vitals/Pain Pain Assessment Pain Assessment: Faces Faces Pain Scale: Hurts a little bit Pain Location: L knee Pain Descriptors / Indicators: Aching Pain Intervention(s): Monitored during session     Extremity/Trunk Assessment Upper Extremity Assessment Upper Extremity Assessment: Overall WFL for tasks assessed   Lower Extremity Assessment Lower Extremity Assessment: Defer to PT evaluation   Cervical / Trunk Assessment Cervical / Trunk Assessment: Kyphotic   Communication Communication Communication: No apparent difficulties   Cognition Arousal:  Alert Behavior During Therapy: WFL for tasks assessed/performed Overall Cognitive Status: Within Functional Limits for tasks assessed                                       General Comments   O2 to 93% on RA and 99% on 2L    Exercises     Shoulder Instructions      Home Living Family/patient expects to be discharged to:: Private residence Living Arrangements: Spouse/significant other Available Help at Discharge: Family;Available 24  hours/day Type of Home: House Home Access: Stairs to enter [employer] of Steps: 2 and 1 threshold Entrance Stairs-Rails: Left;Right;Can reach both Home Layout: Two level;Able to live on main level with bedroom/bathroom (Bathroom is in the hallway approx 30 ft from patient's bed.)     Bathroom Shower/Tub: Producer, television/film/video: Handicapped height Bathroom Accessibility: Yes How Accessible: Accessible via walker Home Equipment: Rollator (4 wheels);Grab bars - tub/shower;Hand held shower head;Wheelchair - manual;Lift chair;Grab bars - toilet   Additional Comments: RW, 3n1 and wheelchair have been loaned out since patient's TKR      Prior Functioning/Environment Prior Level of Function : Independent/Modified Independent;Driving                        OT Problem List: Decreased range of motion;Impaired balance (sitting and/or standing);Pain      OT Treatment/Interventions: Self-care/ADL training;Therapeutic activities;Patient/family education;Balance training;DME and/or AE instruction    OT Goals(Current goals can be found in the care plan section) Acute Rehab OT Goals Patient Stated Goal: Return home OT Goal Formulation: With patient Time For Goal Achievement: 07/15/23 Potential to Achieve Goals: Good ADL Goals Pt Will Perform Grooming: standing;with set-up Pt Will Perform Lower Body Dressing: with min assist;sit to/from stand;with adaptive equipment Pt Will Transfer to Toilet: ambulating;regular height toilet;with contact guard assist  OT Frequency: Min 1X/week    Co-evaluation              AM-PAC OT "6 Clicks" Daily Activity     Outcome Measure Help from another person eating meals?: None Help from another person taking care of personal grooming?: A Little Help from another person toileting, which includes using toliet, bedpan, or urinal?: A Little Help from another person bathing (including washing, rinsing, drying)?: A Lot Help from  another person to put on and taking off regular upper body clothing?: None Help from another person to put on and taking off regular lower body clothing?: A Lot 6 Click Score: 18   End of Session Equipment Utilized During Treatment: Rolling walker (2 wheels) Nurse Communication: Mobility status  Activity Tolerance: Patient tolerated treatment well Patient left: in bed;with call bell/phone within reach;with family/visitor present  OT Visit Diagnosis: Unsteadiness on feet (R26.81);Pain Pain - Right/Left: Left Pain - part of body: Knee                Time: 0865-7846 OT Time Calculation (min): 25 min Charges:  OT General Charges $OT Visit: 1 Visit OT Evaluation $OT Eval Moderate Complexity: 1 Mod OT Treatments $Self Care/Home Management : 8-22 mins  07/01/2023  RP, OTR/L  Acute Rehabilitation Services  Office:  [phone]   Suzanna Obey 07/01/2023, 4:37 PM

## 2023-07-01 NOTE — H&P (View-Only) (Signed)
Orthopaedic Trauma Service (OTS) Consult   Patient ID: Tracey [name] MRN: [id number] DOB/AGE: 10-22-1935 87 y.o.  Reason for Consult:Left distal femur fracture Referring Physician: [name], MD Tracey [name]  HPI: Tracey [name] is an 87 y.o. female who is being seen in consultation at the request of [name] for evaluation of left distal femur fracture.  Patient was coming down the steps and slid having her left knee go behind her sustaining a left periprosthetic distal femur fracture.  Due to the unstable nature of her injury it was felt that orthopedic traumatology was required to provide definitive care.  She was transferred from [facility].  Patient was seen and evaluated in preoperative holding area.  Her husband is at bedside.  Currently having pain.  Denies any injuries anywhere else.  Denies any history of diabetes but does have some heart history.  She has bilateral total knee replacements.  She states that she has not had any problems with her previous knee replacement.  She does take Plavix but denies any other blood thinners.  Past Medical History:  Diagnosis Date   Anxiety    Arthritis    Atrial fibrillation (HCC)    history   Chronic kidney disease    ckd stage II   Coronary artery disease    Diabetes mellitus    non insulin dependent   Dysrhythmia    atrial fibrillation, history of   GERD (gastroesophageal reflux disease)    Heart murmur    History of kidney stones    Hyperlipidemia    Hypertension    Peripheral vascular disease (HCC)    Rectocele    Syncope 09/2019   UTI (urinary tract infection)     Past Surgical History:  Procedure Laterality Date   ABDOMINAL AORTA STENT     APPENDECTOMY     BREAST BIOPSY Left    20 years ago   BREAST BIOPSY Right 2008   BREAST EXCISIONAL BIOPSY     rt 2008 lt "20 years ago"   CARDIAC CATHETERIZATION     CATARACT EXTRACTION W/PHACO Left 08/15/2015   Procedure: CATARACT EXTRACTION PHACO AND INTRAOCULAR LENS  PLACEMENT (IOC);  Surgeon: Sallee Lange, MD;  Location: [facility] ORS;  Service: Ophthalmology;  Laterality: Left;  [place] 01:28 AP% 26.4 CDE 35.73 fluid pack # [id number]   COLONOSCOPY WITH PROPOFOL N/A 01/16/2016   Procedure: COLONOSCOPY WITH PROPOFOL;  Surgeon: Scot Jun, MD;  Location: [facility] ENDOSCOPY;  Service: Endoscopy;  Laterality: N/A;   CORONARY ANGIOPLASTY WITH STENT PLACEMENT     CORONARY ARTERY BYPASS GRAFT  1995   with LIMA  to the LAD, SVG to OM1, SVG to PDA   ESOPHAGOGASTRODUODENOSCOPY (EGD) WITH PROPOFOL N/A 01/16/2016   Procedure: ESOPHAGOGASTRODUODENOSCOPY (EGD) WITH PROPOFOL;  Surgeon: Scot Jun, MD;  Location: [facility] ENDOSCOPY;  Service: Endoscopy;  Laterality: N/A;   EYE SURGERY     JOINT REPLACEMENT Right    KNEE ARTHROPLASTY Left 01/06/2020   Procedure: COMPUTER ASSISTED TOTAL KNEE ARTHROPLASTY;  Surgeon: Donato Heinz, MD;  Location: [facility] ORS;  Service: Orthopedics;  Laterality: Left;   LOWER EXTREMITY ANGIOGRAPHY N/A 07/08/2019   Procedure: Abdominal Aortagraphy;  Surgeon: Annice Needy, MD;  Location: [facility] INVASIVE CV LAB;  Service: Cardiovascular;  Laterality: N/A;   RENAL ANGIOGRAPHY Left 08/26/2019   Procedure: RENAL ANGIOGRAPHY;  Surgeon: Annice Needy, MD;  Location: [facility] INVASIVE CV LAB;  Service: Cardiovascular;  Laterality: Left;   RENAL ARTERY STENT     left,  By Dr Earnestine Leys   REPLACEMENT TOTAL KNEE  03/25/2013   right   TONSILLECTOMY      Family History  Problem Relation Age of Onset   Heart disease Father    Other Brother        open heart surgery   Cancer Brother    Other Brother        open heart surgery   Other Brother        open heart surgery   Cancer Daughter    Hyperlipidemia Daughter    Breast cancer Neg Hx    Kidney cancer Neg Hx    Bladder Cancer Neg Hx     Social History:  reports that she has never smoked. She has never used smokeless tobacco. She reports that she does not drink alcohol and does not use drugs.  Allergies:   Allergies  Allergen Reactions   Codeine Nausea Only    Sick to her stomach   Erythromycin Nausea And Vomiting and Other (See Comments)   Tramadol Nausea Only and Other (See Comments)    Loss of appetite and did not eat   Ciprofloxacin Diarrhea    Upsets stomach    Medications:  No current facility-administered medications on file prior to encounter.   Current Outpatient Medications on File Prior to Encounter  Medication Sig Dispense Refill   acetaminophen (TYLENOL) 500 MG tablet Take 500 mg by mouth 2 (two) times daily as needed for moderate pain.     carvedilol (COREG) 3.125 MG tablet TAKE ONE TABLET BY MOUTH ONCE DAILY 90 tablet 3   clopidogrel (PLAVIX) 75 MG tablet Take 1 tablet (75 mg total) by mouth daily. 90 tablet 3   CRANBERRY PO Take 1 tablet by mouth daily.     ezetimibe (ZETIA) 10 MG tablet TAKE ONE TABLET BY MOUTH ONCE DAILY 90 tablet 3   glipiZIDE (GLUCOTROL XL) 5 MG 24 hr tablet Take 1 tablet (5 mg total) by mouth daily with breakfast. 90 tablet 1   glucose blood (ONETOUCH VERIO) test strip Use as instructed to monitor glucose twice daily 100 each 3   lisinopril (ZESTRIL) 10 MG tablet TAKE ONE TABLET BY MOUTH ONCE DAILY 90 tablet 3   metFORMIN (GLUCOPHAGE-XR) 500 MG 24 hr tablet Take 1 tablet (500 mg total) by mouth daily. 90 tablet 3   Multiple Vitamins-Minerals (MULTIVITAMIN WITH MINERALS) tablet Take 1 tablet by mouth daily.     nitroGLYCERIN (NITROSTAT) 0.4 MG SL tablet Place 1 tablet (0.4 mg total) under the tongue every 5 (five) minutes as needed for chest pain. 25 tablet 3   OneTouch Delica Lancets 33G MISC USE TO TEST TWICE A DAY 100 each 1   pantoprazole (PROTONIX) 40 MG tablet TAKE ONE TABLET BY MOUTH ONCE DAILY 90 tablet 0   rosuvastatin (CRESTOR) 40 MG tablet TAKE ONE TABLET BY MOUTH ONCE DAILY 90 tablet 3     ROS: Constitutional: No fever or chills Vision: No changes in vision ENT: No difficulty swallowing CV: No chest pain Pulm: No SOB or  wheezing GI: No nausea or vomiting GU: No urgency or inability to hold urine Skin: No poor wound healing Neurologic: No numbness or tingling Psychiatric: No depression or anxiety Heme: No bruising Allergic: No reaction to medications or food   Exam: Blood pressure (!) 124/56, pulse 79, temperature 98.7 F (37.1 C), temperature source Oral, resp. rate 17, height 5\' 3"  (1.6 m), weight 58.1 kg, SpO2 93%. General: No acute distress Orientation: Awake alert and  oriented x 3 Mood and Affect: Cooperative and pleasant Gait: Unable to assess due to her fracture Coordination and balance: Within normal limits   Left lower extremity: Knee immobilizer is in place.  Obvious deformity through the lower part of her knee.  She has compartments are soft compressible.  She has active dorsiflexion plantarflexion of foot and ankle.  She is warm well-perfused foot.  She has brisk cap refill less than 2 seconds.  Right Lower extremity: Skin without lesions. No tenderness to palpation. Full painless ROM, full strength in each muscle groups without evidence of instability.   Medical Decision Making: Data: Imaging: X-rays and CT scan are reviewed which shows a periprosthetic femur fracture.  Implant appears to be stable.  Labs:  Results for orders placed or performed during the hospital encounter of 06/30/23 (from the past 24 hour(s))  Glucose, capillary     Status: Abnormal   Collection Time: 06/30/23  9:19 AM  Result Value Ref Range   Glucose-Capillary 177 (H) 70 - 99 mg/dL  Glucose, capillary     Status: Abnormal   Collection Time: 06/30/23 11:12 AM  Result Value Ref Range   Glucose-Capillary 164 (H) 70 - 99 mg/dL  Glucose, capillary     Status: Abnormal   Collection Time: 06/30/23  4:09 PM  Result Value Ref Range   Glucose-Capillary 139 (H) 70 - 99 mg/dL  Glucose, capillary     Status: Abnormal   Collection Time: 06/30/23  9:25 PM  Result Value Ref Range   Glucose-Capillary 113 (H) 70 - 99  mg/dL  Basic metabolic panel     Status: Abnormal   Collection Time: 07/01/23  4:54 AM  Result Value Ref Range   Sodium 134 (L) 135 - 145 mmol/L   Potassium 4.0 3.5 - 5.1 mmol/L   Chloride 103 98 - 111 mmol/L   CO2 23 22 - 32 mmol/L   Glucose, Bld 138 (H) 70 - 99 mg/dL   BUN 29 (H) 8 - 23 mg/dL   Creatinine, Ser 6.29 (H) 0.44 - 1.00 mg/dL   Calcium 9.4 8.9 - 52.8 mg/dL   GFR, Estimated 48 (L) >60 mL/min   Anion gap 8 5 - 15  CBC     Status: Abnormal   Collection Time: 07/01/23  4:54 AM  Result Value Ref Range   WBC 7.0 4.0 - 10.5 K/uL   RBC 2.93 (L) 3.87 - 5.11 MIL/uL   Hemoglobin 8.8 (L) 12.0 - 15.0 g/dL   HCT 41.3 (L) 24.4 - 01.0 %   MCV 91.1 80.0 - 100.0 fL   MCH 30.0 26.0 - 34.0 pg   MCHC 33.0 30.0 - 36.0 g/dL   RDW 27.2 53.6 - 64.4 %   Platelets 132 (L) 150 - 400 K/uL   nRBC 0.0 0.0 - 0.2 %  Glucose, capillary     Status: Abnormal   Collection Time: 07/01/23  7:07 AM  Result Value Ref Range   Glucose-Capillary 164 (H) 70 - 99 mg/dL     Imaging or Labs ordered: None  Medical history and chart was reviewed and case discussed with medical provider.  Assessment/Plan: 87 year old female who presents with a left periprosthetic distal femur fracture.  Due to the unstable nature of her injury I recommend proceeding with open reduction internal fixation.  Risks and benefits were discussed with the patient and her husband.  Risks included but not limited to bleeding, infection, malunion, nonunion, hardware failure, hardware irritation, nerve and blood vessel injury, knee stiffness,  even the possibility anesthetic complications.  They agreed to proceed with surgery and consent was obtained.  Roby Lofts, MD [facility] [phone] (office) [email or web address]

## 2023-07-01 NOTE — Anesthesia Procedure Notes (Signed)
Procedure Name: Intubation Date/Time: 07/01/2023 7:54 AM  Performed by: Gus Puma, CRNAPre-anesthesia Checklist: Patient identified, Emergency Drugs available, Suction available and Patient being monitored Patient Re-evaluated:Patient Re-evaluated prior to induction Oxygen Delivery Method: Circle System Utilized Preoxygenation: Pre-oxygenation with 100% oxygen Induction Type: IV induction Ventilation: Mask ventilation without difficulty Laryngoscope Size: Mac and 3 Grade View: Grade I Tube type: Oral Number of attempts: 1 Airway Equipment and Method: Stylet Placement Confirmation: ETT inserted through vocal cords under direct vision, positive ETCO2 and breath sounds checked- equal and bilateral Secured at: 21 cm Tube secured with: Tape Dental Injury: Teeth and Oropharynx as per pre-operative assessment

## 2023-07-01 NOTE — Op Note (Addendum)
Orthopaedic Surgery Operative Note (CSN: [id number] ) Date of Surgery: 07/01/2023  Admit Date: 06/30/2023   Diagnoses: Pre-Op Diagnoses: Left periprosthetic supracondylar distal femur fracture  Post-Op Diagnosis: Same  Procedures: CPT 27511-Open reduction internal fixation of left supracondylar distal femur fracture  Surgeons : Primary: Roby Lofts, MD  Assistant: Darron Doom, RNFA  Location: OR 3   Anesthesia: General   Antibiotics: Ancef 2g preop with 1 gm vancomycin powder placed topically   Tourniquet time: None    Estimated Blood Loss: 150 mL  Complications:* No complications entered in OR log *   Specimens:* No specimens in log *   Implants: Implant Name Type Inv. Item Serial No. Manufacturer Lot No. LRB No. Used Action  CAP LOCK NCB - [id number] Cap CAP LOCK NCB  [employer](ORTH,TRAU,BIO,SG)  Left 1 Implanted  PLATE DIST FEM 82N - [id number] Plate PLATE DIST FEM 84O  [employer](ORTH,TRAU,BIO,SG)  Left 1 Implanted  SCREW 5.0 - [id number] Screw SCREW 5.0  [employer](ORTH,TRAU,BIO,SG)  Left 1 Implanted  SCREW NCB 5.0X75MM - [id number] Screw SCREW NCB 5.0X75MM  [employer](ORTH,TRAU,BIO,SG)  Left 4 Implanted  SCREW NCB 5.0X36MM - [id number] Screw SCREW NCB 5.0X36MM  [employer](ORTH,TRAU,BIO,SG)  Left 2 Implanted  SCREW NCB 4.0X36MM - [id number] Screw SCREW NCB 4.0X36MM  [employer](ORTH,TRAU,BIO,SG)  Left 1 Implanted     Indications for Surgery: 87 year old female who sustained a fall and a left periprosthetic supracondylar distal femur fracture.  Due to the unstable nature of her injury I recommend proceeding with open reduction internal fixation.  Risks and benefits were discussed with the patient.  Risks included but not limited to bleeding, infection, malunion, nonunion, hardware failure, hardware irritation, nerve and blood vessel injury, DVT, even the possibility anesthetic complications.  She agreed to proceed with surgery and consent was  obtained.  Operative Findings: Open reduction internal fixation of left periprosthetic distal femur fracture using [employer] NCB distal femoral locking plate.  Procedure: The patient was identified in the preoperative holding area. Consent was confirmed with the patient and their family and all questions were answered. The operative extremity was marked after confirmation with the patient. she was then brought back to the operating room by our anesthesia colleagues.  She was placed under general anesthetic and carefully transferred over to radiolucent flattop table.  A bump was placed under her operative hip.  The left lower extremity was then prepped and draped in usual sterile fashion.  A timeout was performed to verify the patient, the procedure, and the extremity.  Preoperative antibiotics were dosed.  Fluoroscopic imaging showed the unstable nature of her injury.  The hip and knee were flexed over a triangle.  Traction was applied and alignment was maintained.  A lateral approach to the distal femur was then made and carried down through skin and subcutaneous tissue.  I incised through the IT band and mobilized the vastus lateralis to expose the distal portion of the femur.  Once I had alignment appropriate I then slid a 12 hole [employer] NCB distal femoral locking plate along the lateral cortex of the femur attached to the targeting arm.  I held the distal portion of the plate flush to bone with a 2.0 mm K wire.  I then percutaneously aligned the proximal portion of the plate with a 3.3 mm drill bit through the targeting arm.  I then drilled and placed 5.0 millimeter screws distally to bring the plate flush to bone.  I then percutaneously placed 5.0 millimeter  screws in the femoral shaft to align the coronal portion of the fracture.  I then removed the 3.3 mm drill bit and placed a 4.0 millimeter screw in the proximal screw hole.  I placed locking caps of the 5.0 millimeter screws in the  femoral shaft.  I then returned to the distal segment and placed 3 more 5.0 millimeter screws for total of 5 distally.  Locking caps were placed in all the distal screws.  The targeting arm was removed.  Final fluoroscopic imaging was obtained.  The incision was copiously irrigated.  A gram of vancomycin powder was placed into the incision.  A layered closure of 0 Vicryl, 2-0 Monocryl and 3-0 Monocryl with Dermabond was used to close the skin.  Sterile dressings were applied.  The patient was then awoke from anesthesia and taken to the PACU in stable condition.  Post Op Plan/Instructions: The patient will be weightbearing as tolerated to the left lower extremity.  She will receive postoperative Ancef.  She will receive aspirin and Plavix for DVT prophylaxis.  Will have her mobilize with physical and Occupational Therapy.  I was present and performed the entire surgery.  Truitt Merle, MD [facility]

## 2023-07-02 DIAGNOSIS — M9712XA Periprosthetic fracture around internal prosthetic left knee joint, initial encounter: Secondary | ICD-10-CM | POA: Diagnosis not present

## 2023-07-02 LAB — COMPREHENSIVE METABOLIC PANEL
ALT: 13 U/L (ref 0–44)
AST: 22 U/L (ref 15–41)
Albumin: 2.9 g/dL — ABNORMAL LOW (ref 3.5–5.0)
Alkaline Phosphatase: 41 U/L (ref 38–126)
Anion gap: 7 (ref 5–15)
BUN: 24 mg/dL — ABNORMAL HIGH (ref 8–23)
CO2: 26 mmol/L (ref 22–32)
Calcium: 9.6 mg/dL (ref 8.9–10.3)
Chloride: 102 mmol/L (ref 98–111)
Creatinine, Ser: 1.15 mg/dL — ABNORMAL HIGH (ref 0.44–1.00)
GFR, Estimated: 47 mL/min — ABNORMAL LOW (ref >60)
Glucose, Bld: 171 mg/dL — ABNORMAL HIGH (ref 70–99)
Potassium: 4.2 mmol/L (ref 3.5–5.1)
Sodium: 135 mmol/L (ref 135–145)
Total Bilirubin: 0.6 mg/dL (ref 0.3–1.2)
Total Protein: 5.3 g/dL — ABNORMAL LOW (ref 6.5–8.1)

## 2023-07-02 LAB — CBC
HCT: 22.7 % — ABNORMAL LOW (ref 36.0–46.0)
Hemoglobin: 7.3 g/dL — ABNORMAL LOW (ref 12.0–15.0)
MCH: 29 pg (ref 26.0–34.0)
MCHC: 32.2 g/dL (ref 30.0–36.0)
MCV: 90.1 fL (ref 80.0–100.0)
Platelets: 109 10*3/uL — ABNORMAL LOW (ref 150–400)
RBC: 2.52 MIL/uL — ABNORMAL LOW (ref 3.87–5.11)
RDW: 12.4 % (ref 11.5–15.5)
WBC: 5.3 10*3/uL (ref 4.0–10.5)
nRBC: 0 % (ref 0.0–0.2)

## 2023-07-02 LAB — PREPARE RBC (CROSSMATCH)

## 2023-07-02 LAB — GLUCOSE, CAPILLARY
Glucose-Capillary: 152 mg/dL — ABNORMAL HIGH (ref 70–99)
Glucose-Capillary: 188 mg/dL — ABNORMAL HIGH (ref 70–99)
Glucose-Capillary: 222 mg/dL — ABNORMAL HIGH (ref 70–99)
Glucose-Capillary: 206 mg/dL — ABNORMAL HIGH (ref 70–99)

## 2023-07-02 MED ORDER — CARVEDILOL 3.125 MG PO TABS
3.1250 mg | ORAL_TABLET | Freq: Two times a day (BID) | ORAL | Status: DC
  Administered 2023-07-02 – 2023-07-04 (×4): 3.125 mg via ORAL
  Filled 2023-07-02 (×4): qty 1

## 2023-07-02 MED ORDER — CLOPIDOGREL BISULFATE 75 MG PO TABS
75.0000 mg | ORAL_TABLET | Freq: Every day | ORAL | Status: DC
  Administered 2023-07-03 – 2023-07-04 (×2): 75 mg via ORAL
  Filled 2023-07-02 (×2): qty 1

## 2023-07-02 MED ORDER — SODIUM CHLORIDE 0.9% IV SOLUTION: Freq: Once | INTRAVENOUS | Status: AC

## 2023-07-02 MED ORDER — BISACODYL 10 MG RE SUPP
10.0000 mg | Freq: Once | RECTAL | Status: AC
  Administered 2023-07-03: 10 mg via RECTAL
  Filled 2023-07-02: qty 1

## 2023-07-02 MED ORDER — ASPIRIN 81 MG PO TBEC
81.0000 mg | DELAYED_RELEASE_TABLET | Freq: Every day | ORAL | Status: DC
  Administered 2023-07-03 – 2023-07-04 (×2): 81 mg via ORAL
  Filled 2023-07-02 (×2): qty 1

## 2023-07-02 NOTE — Evaluation (Signed)
Physical Therapy Evaluation Patient Details Name: Tracey [name] MRN: [id number] DOB: [birth year] Today's Date: 07/02/2023  History of Present Illness  87 y.o. female with medical history significant of hypertension, hyperlipidemia, dysrhythmia, CAD s/p CABG in 1995, peripheral vascular disease,diabetes mellitus type 2, CKD stage II, bilateral knee replacements presented 10/13 s/p fall and Periprosthetic left distal femur fracture.  S/p ORIF with WBAT and no ROM restrictions.  Clinical Impression  Pt presents with admitting diagnosis above. Co-treat with OT. Pt today required Min A for all mobility however reported that she feels weaker than she did with OT yesterday. Pt was able to progress ambulation but was limited by lightheadedness despite BP being WNL. PTA pt reports she was fully independent with no AD. Recommend HHPT upon DC and pt may need a RW as she reports that she has loaned out her DME after her TKA. PT will continue to follow.        If plan is discharge home, recommend the following: A little help with walking and/or transfers;A little help with bathing/dressing/bathroom;Assistance with cooking/housework;Direct supervision/assist for medications management;Help with stairs or ramp for entrance;Assist for transportation   Can travel by private vehicle        Equipment Recommendations Rolling walker (2 wheels)  Recommendations for Other Services       Functional Status Assessment Patient has had a recent decline in their functional status and demonstrates the ability to make significant improvements in function in a reasonable and predictable amount of time.     Precautions / Restrictions Precautions Precautions: Fall Restrictions Weight Bearing Restrictions: Yes LLE Weight Bearing: Weight bearing as tolerated      Mobility  Bed Mobility Overal bed mobility: Needs Assistance Bed Mobility: Supine to Sit, Sit to Supine     Supine to sit: Min assist, HOB  elevated Sit to supine: Min assist   General bed mobility comments: min A for assistance for LLE    Transfers Overall transfer level: Needs assistance Equipment used: Rolling walker (2 wheels) Transfers: Sit to/from Stand Sit to Stand: Min assist           General transfer comment: min A for balance with standing/ambulation    Ambulation/Gait Ambulation/Gait assistance: Min assist Gait Distance (Feet): 25 Feet Assistive device: Rolling walker (2 wheels) Gait Pattern/deviations: Antalgic, Decreased stance time - left, Decreased stride length, Step-through pattern Gait velocity: decreased     General Gait Details: Pt reported increased pain with WBing. A few minor LOB noted however pt able to self correct.  Stairs            Wheelchair Mobility     Tilt Bed    Modified Rankin (Stroke Patients Only)       Balance Overall balance assessment: Needs assistance Sitting-balance support: Feet supported Sitting balance-Leahy Scale: Good Sitting balance - Comments: EOB   Standing balance support: Reliant on assistive device for balance Standing balance-Leahy Scale: Fair Standing balance comment: had a few instances of LOB, able to self recover, gets lightheaded                             Pertinent Vitals/Pain Pain Assessment Pain Assessment: Faces Faces Pain Scale: Hurts a little bit Pain Location: L knee Pain Descriptors / Indicators: Aching Pain Intervention(s): Monitored during session    Home Living Family/patient expects to be discharged to:: Private residence Living Arrangements: Spouse/significant other Available Help at Discharge: Family;Available 24 hours/day Type of Home: House  Home Access: Stairs to enter Entrance Stairs-Rails: Left;Right;Can reach both Entrance Stairs-Number of Steps: 2 and 1 threshold   Home Layout: Two level;Able to live on main level with bedroom/bathroom Home Equipment: Rollator (4 wheels);Grab bars -  tub/shower;Hand held shower head;Wheelchair - manual;Lift chair;Grab bars - toilet Additional Comments: RW, 3n1 and wheelchair have been loaned out since patient's TKR    Prior Function Prior Level of Function : Independent/Modified Independent;Driving             Mobility Comments: Ind no AD ADLs Comments: Ind     Extremity/Trunk Assessment   Upper Extremity Assessment Upper Extremity Assessment: Overall WFL for tasks assessed    Lower Extremity Assessment Lower Extremity Assessment: LLE deficits/detail LLE Deficits / Details: L distal femur fx    Cervical / Trunk Assessment Cervical / Trunk Assessment: Kyphotic  Communication   Communication Communication: No apparent difficulties  Cognition Arousal: Alert Behavior During Therapy: WFL for tasks assessed/performed Overall Cognitive Status: Within Functional Limits for tasks assessed                                          General Comments General comments (skin integrity, edema, etc.): O2 98% on RA, BP 105/51 after ambulation, Pt felt lightheaded during ambulation    Exercises     Assessment/Plan    PT Assessment Patient needs continued PT services  PT Problem List Decreased strength;Decreased range of motion;Decreased balance;Decreased activity tolerance;Decreased mobility;Decreased coordination;Decreased knowledge of use of DME;Decreased safety awareness;Decreased knowledge of precautions;Pain;Cardiopulmonary status limiting activity       PT Treatment Interventions DME instruction;Gait training;Stair training;Functional mobility training;Therapeutic activities;Therapeutic exercise;Balance training;Patient/family education;Neuromuscular re-education    PT Goals (Current goals can be found in the Care Plan section)  Acute Rehab PT Goals Patient Stated Goal: to go home PT Goal Formulation: With patient Time For Goal Achievement: 07/16/23 Potential to Achieve Goals: Fair    Frequency Min  1X/week     Co-evaluation PT/OT/SLP Co-Evaluation/Treatment: Yes Reason for Co-Treatment: To address functional/ADL transfers PT goals addressed during session: Mobility/safety with mobility;Proper use of DME OT goals addressed during session: ADL's and self-care;Proper use of Adaptive equipment and DME       AM-PAC PT "6 Clicks" Mobility  Outcome Measure Help needed turning from your back to your side while in a flat bed without using bedrails?: A Little Help needed moving from lying on your back to sitting on the side of a flat bed without using bedrails?: A Little Help needed moving to and from a bed to a chair (including a wheelchair)?: A Little Help needed standing up from a chair using your arms (e.g., wheelchair or bedside chair)?: A Little Help needed to walk in hospital room?: A Little Help needed climbing 3-5 steps with a railing? : A Lot 6 Click Score: 17    End of Session Equipment Utilized During Treatment: Gait belt Activity Tolerance: Patient tolerated treatment well Patient left: in bed;with call bell/phone within reach;with bed alarm set;with family/visitor present Nurse Communication: Mobility status PT Visit Diagnosis: Other abnormalities of gait and mobility (R26.89)    Time: 2952-8413 PT Time Calculation (min) (ACUTE ONLY): 29 min   Charges:   PT Evaluation $PT Eval Moderate Complexity: 1 Mod   PT General Charges $$ ACUTE PT VISIT: 1 Visit         Tracey [name], PT, DPT Acute Rehab Services [phone]  [name] 07/02/2023, 4:18 PM

## 2023-07-02 NOTE — Plan of Care (Signed)
Problem: Nutrition: Goal: Adequate nutrition will be maintained Outcome: Progressing   Problem: Coping: Goal: Level of anxiety will decrease Outcome: Progressing   Problem: Elimination: Goal: Will not experience complications related to bowel motility Outcome: Progressing

## 2023-07-02 NOTE — Progress Notes (Signed)
Patient ID: Tracey [name], female   DOB: [birth year], 87 y.o.   MRN: [id number]   LOS: 2 days   Subjective: Doing well, not much pain.   Objective: Vital signs in last 24 hours: Temp:  [97.6 F (36.4 C)-99 F (37.2 C)] 99 F (37.2 C) (10/15 0716) Pulse Rate:  [63-77] 75 (10/15 0716) Resp:  [17] 17 (10/15 0716) BP: (98-156)/(40-65) 103/58 (10/15 0716) SpO2:  [98 %-100 %] 99 % (10/15 0716)     Laboratory  CBC Recent Labs    07/01/23 0454 07/02/23 0834  WBC 7.0 5.3  HGB 8.8* 7.3*  HCT 26.7* 22.7*  PLT 132* 109*   BMET Recent Labs    06/29/23 1555 07/01/23 0454  NA 136 134*  K 4.6 4.0  CL 104 103  CO2 23 23  GLUCOSE 244* 138*  BUN 32* 29*  CREATININE 0.96 1.12*  CALCIUM 10.3 9.4     Physical Exam General appearance: alert and no distress LLE: Dressing C/D/I, EHL 5/5, DP 2+    Assessment/Plan: S/p ORIF left distal femur -- Plan PT/OT today, WBAT LLE.    Freeman Caldron, PA-C Orthopedic Surgery [phone] 07/02/2023

## 2023-07-02 NOTE — Progress Notes (Addendum)
PROGRESS NOTE  Tracey [name] TDD:[id number] DOB: [birth year] DOA: 06/30/2023 PCP: [name], MD   LOS: 2 days   Brief Narrative / Interim history: 87 year old female with HTN, HLD, CAD status post CABG 1995, PVD, DM 2, CKD 2, bilateral knee replacements and arthritis comes into the hospital with left knee pain.  She had a fall while going down the steps and slid down.  She immediately felt a pop in her knee and had significant pain, was unable to get up.  There is no loss of consciousness.  She is on Plavix.  X-rays revealed a comminuted periprosthetic fracture of the distal left femur.  She was admitted to the hospital and orthopedic surgery was consulted.  Subjective / 24h Interval events: She is doing well this morning.  States that she has no pain.  Actually worked with PT last night few hours after surgery and was able to stand.  No chest discomfort, no shortness of breath.  No nausea or vomiting.  Has not had a BM yet  Assesement and Plan: Principal Problem:   Periprosthetic fracture around prosthetic knee Active Problems:   Fall down steps   Leukocytosis   Pancytopenia (HCC)   History of cardiac dysrhythmia   Status post aorto-coronary artery bypass graft   PAD (peripheral artery disease) (HCC)   Essential hypertension, benign   Type 2 diabetes mellitus with stage 2 chronic kidney disease, without long-term current use of insulin (HCC)   Hyperlipidemia associated with type 2 diabetes mellitus (HCC)   GERD (gastroesophageal reflux disease)  Principal problem Periprosthetic left distal femur fracture secondary to fall -no loss of consciousness.  Imaging on admission revealed left periprosthetic distal femur fracture.  She has a history of left knee replacement back in April 2021.  Orthopedic surgery consulted, she was taken to the OR on 10/14 and is status post ORIF of left supracondylar distal femur fracture -Monitor postoperatively, hemoglobin this morning pending.  PT will  see her again today, no recommendations were made yesterday -Appreciate orthopedic surgery consultation -Discussed with the [facility], DVT prophylaxis with aspirin 81 mg in addition to her Plavix.  She did receive the full dose aspirin today, and will resume Plavix tomorrow  Active problems Leukocytosis-likely reactive in the setting of fracture.  CBC pending this morning  Pancytopenia-chronic.  Hemoglobin down to 7.3 this morning.  I did offer a blood transfusion because she was in the 11 range a few days ago, however she declined and wants to see what the hemoglobin trend is tomorrow.  History of SVT-appears to be in sinus rhythm.  Keep on telemetry for now  Coronary artery disease-history of CABG in 1995.  Plavix can be resumed per orthopedic surgery, but will resume tomorrow given that she received full dose aspirin this morning and she is more anemic  PAD-continue statin, resume Plavix postop per Ortho  Essential hypertension-on Coreg and lisinopril.  Blood pressure stable  Hyperlipidemia-continue statin  DM 2 -A1c 8.1 which is appropriate in her age group.  Continue sliding scale  Lab Results  Component Value Date   HGBA1C 8.1 (A) 04/01/2023   CBG (last 3)  Recent Labs    07/01/23 1644 07/01/23 2231 07/02/23 0804  GLUCAP 215* 131* 152*     Scheduled Meds:  aspirin  325 mg Oral Daily   carvedilol  3.125 mg Oral Daily   ezetimibe  10 mg Oral Daily   insulin aspart  0-5 Units Subcutaneous QHS   insulin aspart  0-9 Units  Subcutaneous TID WC   insulin glargine-yfgn  5 Units Subcutaneous Daily   ketorolac  15 mg Intravenous Q6H   lisinopril  10 mg Oral Daily   pantoprazole  40 mg Oral Daily   rosuvastatin  40 mg Oral Daily   senna-docusate  1 tablet Oral BID   Continuous Infusions:   PRN Meds:.HYDROcodone-acetaminophen, melatonin, morphine injection, ondansetron (ZOFRAN) IV  Current Outpatient Medications  Medication Instructions   acetaminophen (TYLENOL) 500 mg,  Oral, 2 times daily PRN   carvedilol (COREG) 3.125 mg, Oral, Daily   clopidogrel (PLAVIX) 75 mg, Oral, Daily   CRANBERRY PO 1 tablet, Oral, Daily   ezetimibe (ZETIA) 10 mg, Oral, Daily   glipiZIDE (GLUCOTROL XL) 5 mg, Oral, Daily with breakfast   glucose blood (ONETOUCH VERIO) test strip Use as instructed to monitor glucose twice daily   lisinopril (ZESTRIL) 10 mg, Oral, Daily   metFORMIN (GLUCOPHAGE-XR) 500 mg, Oral, Daily   Multiple Vitamins-Minerals (MULTIVITAMIN WITH MINERALS) tablet 1 tablet, Oral, Daily   nitroGLYCERIN (NITROSTAT) 0.4 mg, Sublingual, Every 5 min PRN   OneTouch Delica Lancets 33G MISC USE TO TEST TWICE A DAY   pantoprazole (PROTONIX) 40 mg, Oral, Daily   rosuvastatin (CRESTOR) 40 mg, Oral, Daily    Diet Orders (From admission, onward)     Start     Ordered   07/01/23 0909  Diet regular Room service appropriate? Yes; Fluid consistency: Thin  Diet effective now       Question Answer Comment  Room service appropriate? Yes   Fluid consistency: Thin      07/01/23 0908            DVT prophylaxis:    Lab Results  Component Value Date   PLT 132 (L) 07/01/2023      Code Status: Full Code  Family Communication: Multiple family members present at the bedside  Status is: Inpatient Remains inpatient appropriate because: severity of illness   Level of care: Telemetry Medical  Consultants:  Orthopedic surgery   Objective: Vitals:   07/01/23 1409 07/01/23 2232 07/02/23 0521 07/02/23 0716  BP: (!) 98/40 (!) 107/40 120/65 (!) 103/58  Pulse: 63 71 77 75  Resp: 17 17  17   Temp: 97.9 F (36.6 C) 98.7 F (37.1 C) 98.3 F (36.8 C) 99 F (37.2 C)  TempSrc:  Oral Oral Oral  SpO2: 100% 100% 99% 99%  Weight:      Height:        Intake/Output Summary (Last 24 hours) at 07/02/2023 0850 Last data filed at 07/02/2023 7829 Gross per 24 hour  Intake 740 ml  Output 750 ml  Net -10 ml   Wt Readings from Last 3 Encounters:  07/01/23 58.1 kg  06/29/23  59 kg  04/01/23 59 kg    Examination:  Constitutional: NAD Eyes: lids and conjunctivae normal, no scleral icterus ENMT: mmm Neck: normal, supple Respiratory: clear to auscultation bilaterally, no wheezing, no crackles. Normal respiratory effort.  Cardiovascular: Regular rate and rhythm, no murmurs / rubs / gallops. No LE edema. Abdomen: soft, no distention, no tenderness. Bowel sounds positive.    Data Reviewed: I have independently reviewed following labs and imaging studies   CBC Recent Labs  Lab 06/29/23 1555 07/01/23 0454  WBC 11.0* 7.0  HGB 11.5* 8.8*  HCT 35.2* 26.7*  PLT 149* 132*  MCV 91.7 91.1  MCH 29.9 30.0  MCHC 32.7 33.0  RDW 12.2 12.6    Recent Labs  Lab 06/29/23 1555 07/01/23 0454  NA 136 134*  K 4.6 4.0  CL 104 103  CO2 23 23  GLUCOSE 244* 138*  BUN 32* 29*  CREATININE 0.96 1.12*  CALCIUM 10.3 9.4  AST 37  --   ALT 30  --   ALKPHOS 51  --   BILITOT 0.9  --   ALBUMIN 4.3  --   INR 1.1  --     ------------------------------------------------------------------------------------------------------------------ No results for input(s): "CHOL", "HDL", "LDLCALC", "TRIG", "CHOLHDL", "LDLDIRECT" in the last 72 hours.  Lab Results  Component Value Date   HGBA1C 8.1 (A) 04/01/2023   ------------------------------------------------------------------------------------------------------------------ No results for input(s): "TSH", "T4TOTAL", "T3FREE", "THYROIDAB" in the last 72 hours.  Invalid input(s): "FREET3"  Cardiac Enzymes No results for input(s): "CKMB", "TROPONINI", "MYOGLOBIN" in the last 168 hours.  Invalid input(s): "CK" ------------------------------------------------------------------------------------------------------------------ No results found for: "BNP"  CBG: Recent Labs  Lab 07/01/23 0859 07/01/23 1123 07/01/23 1644 07/01/23 2231 07/02/23 0804  GLUCAP 153* 186* 215* 131* 152*    No results found for this or any  previous visit (from the past 240 hour(s)).   Radiology Studies: DG FEMUR PORT MIN 2 VIEWS LEFT  Result Date: 07/01/2023 CLINICAL DATA:  Postop. EXAM: LEFT FEMUR PORTABLE 2 VIEWS COMPARISON:  Preoperative imaging. FINDINGS: Lateral plate and multi screw fixation of distal femur fracture. Improved fracture alignment from preoperative imaging. Recent postsurgical change includes air and edema in the soft tissues. Prior knee arthroplasty again seen. IMPRESSION: ORIF of distal femur fracture. No immediate postoperative complication. Electronically Signed   By: Narda Rutherford M.D.   On: 07/01/2023 09:30     Pamella Pert, MD, PhD [facility]  Between 7 am - 7 pm I am available, please contact me via Amion (for emergencies) or Securechat (non urgent messages)  Between 7 pm - 7 am I am not available, please contact night coverage MD/APP via Amion

## 2023-07-02 NOTE — Progress Notes (Signed)
Occupational Therapy Treatment Patient Details Name: Tracey [name] MRN: [id number] DOB: Jun 09, 1936 Today's Date: 07/02/2023   History of present illness 87 y.o. female with medical history significant of hypertension, hyperlipidemia, dysrhythmia, CAD s/p CABG in 1995, peripheral vascular disease,diabetes mellitus type 2, CKD stage II, bilateral knee replacements presented 10/13 s/p fall and Periprosthetic left distal femur fracture.  S/p ORIF with WBAT and no ROM restrictions.   OT comments  Pt c/o minimal pain at rest and during activity, some lightheadedness during ambulation, VSS. Pt min A for bed mobility in/out of bed, able to perform LB dressing min A to help with LLE, min A for transfers and STS, has LOB and able to self recover using RW. Pt assisted back to bed as lightheaded feeling did not go away after sitting for a few minutes at EOB, feeling better when lying down. Will continue to follow acutely, DC plan with no OT follow up still appropriate at this time, Pt doing well with ADLs, has assistance from family upon return home.       If plan is discharge home, recommend the following:  Assist for transportation;Assistance with cooking/housework;A little help with bathing/dressing/bathroom;A little help with walking and/or transfers   Equipment Recommendations  None recommended by OT    Recommendations for Other Services      Precautions / Restrictions Precautions Precautions: Fall Restrictions Weight Bearing Restrictions: Yes LLE Weight Bearing: Weight bearing as tolerated       Mobility Bed Mobility Overal bed mobility: Needs Assistance Bed Mobility: Supine to Sit, Sit to Supine     Supine to sit: Min assist, HOB elevated Sit to supine: Min assist   General bed mobility comments: min A for assistance for LLE    Transfers Overall transfer level: Needs assistance Equipment used: Rolling walker (2 wheels) Transfers: Sit to/from Stand, Bed to  chair/wheelchair/BSC Sit to Stand: Min assist     Step pivot transfers: Min assist     General transfer comment: min A for balance with standing/ambulation     Balance Overall balance assessment: Needs assistance Sitting-balance support: Feet supported Sitting balance-Leahy Scale: Good Sitting balance - Comments: EOB   Standing balance support: Reliant on assistive device for balance Standing balance-Leahy Scale: Fair Standing balance comment: had a few instances of LOB, able to self recover, gets lightheaded                           ADL either performed or assessed with clinical judgement   ADL Overall ADL's : Needs assistance/impaired Eating/Feeding: Independent   Grooming: Set up;Sitting           Upper Body Dressing : Set up;Sitting   Lower Body Dressing: Minimal assistance;Sit to/from stand   Toilet Transfer: Minimal assistance;Rolling walker (2 wheels);Ambulation   Toileting- Clothing Manipulation and Hygiene: Contact guard assist;Sitting/lateral lean       Functional mobility during ADLs: Minimal assistance;Rolling walker (2 wheels) General ADL Comments: Pt min A for LB dressing and mobility, has LOB with ambulation, able to self correct, min A for safety.    Extremity/Trunk Assessment Upper Extremity Assessment Upper Extremity Assessment: Overall WFL for tasks assessed            Vision       Perception     Praxis      Cognition Arousal: Alert Behavior During Therapy: WFL for tasks assessed/performed Overall Cognitive Status: Within Functional Limits for tasks assessed  Exercises      Shoulder Instructions       General Comments O2 98%, BP 105/51 after ambulation, Pt felt lightheaded during ambulation    Pertinent Vitals/ Pain       Pain Assessment Pain Assessment: Faces Faces Pain Scale: Hurts a little bit Pain Location: L knee Pain Descriptors / Indicators:  Aching Pain Intervention(s): Monitored during session  Home Living Family/patient expects to be discharged to:: Private residence Living Arrangements: Spouse/significant other Available Help at Discharge: Family;Available 24 hours/day Type of Home: House Home Access: Stairs to enter [employer] of Steps: 2 and 1 threshold Entrance Stairs-Rails: Left;Right;Can reach both Home Layout: Two level;Able to live on main level with bedroom/bathroom     Bathroom Shower/Tub: Producer, television/film/video: Handicapped height Bathroom Accessibility: Yes   Home Equipment: Rollator (4 wheels);Grab bars - tub/shower;Hand held shower head;Wheelchair - manual;Lift chair;Grab bars - toilet   Additional Comments: RW, 3n1 and wheelchair have been loaned out since patient's TKR      Prior Functioning/Environment              Frequency  Min 1X/week        Progress Toward Goals  OT Goals(current goals can now be found in the care plan section)  Progress towards OT goals: Progressing toward goals  Acute Rehab OT Goals Patient Stated Goal: to return to PLOF OT Goal Formulation: With patient Time For Goal Achievement: 07/15/23 Potential to Achieve Goals: Good ADL Goals Pt Will Perform Grooming: standing;with set-up Pt Will Perform Lower Body Dressing: with min assist;sit to/from stand;with adaptive equipment Pt Will Transfer to Toilet: ambulating;regular height toilet;with contact guard assist  Plan      Co-evaluation    PT/OT/SLP Co-Evaluation/Treatment: Yes Reason for Co-Treatment: To address functional/ADL transfers   OT goals addressed during session: ADL's and self-care;Proper use of Adaptive equipment and DME      AM-PAC OT "6 Clicks" Daily Activity     Outcome Measure   Help from another person eating meals?: None Help from another person taking care of personal grooming?: A Little Help from another person toileting, which includes using toliet, bedpan,  or urinal?: A Little Help from another person bathing (including washing, rinsing, drying)?: A Lot Help from another person to put on and taking off regular upper body clothing?: None Help from another person to put on and taking off regular lower body clothing?: A Lot 6 Click Score: 18    End of Session Equipment Utilized During Treatment: Gait belt;Rolling walker (2 wheels)  OT Visit Diagnosis: Unsteadiness on feet (R26.81);Pain Pain - Right/Left: Left Pain - part of body: Knee   Activity Tolerance Patient tolerated treatment well   Patient Left in bed;with call bell/phone within reach;with family/visitor present   Nurse Communication Mobility status        Time: 7829-5621 OT Time Calculation (min): 28 min  Charges: OT General Charges $OT Visit: 1 Visit OT Treatments $Therapeutic Activity: 8-22 mins  Takeem Krotzer, OTR/L   Alexis Goodell 07/02/2023, 3:15 PM

## 2023-07-03 ENCOUNTER — Encounter (HOSPITAL_COMMUNITY): Payer: Self-pay | Admitting: Student

## 2023-07-03 ENCOUNTER — Ambulatory Visit: Payer: BC Managed Care – PPO | Admitting: Nurse Practitioner

## 2023-07-03 DIAGNOSIS — E1122 Type 2 diabetes mellitus with diabetic chronic kidney disease: Secondary | ICD-10-CM | POA: Diagnosis not present

## 2023-07-03 DIAGNOSIS — I10 Essential (primary) hypertension: Secondary | ICD-10-CM | POA: Diagnosis not present

## 2023-07-03 DIAGNOSIS — M9712XA Periprosthetic fracture around internal prosthetic left knee joint, initial encounter: Secondary | ICD-10-CM | POA: Diagnosis not present

## 2023-07-03 DIAGNOSIS — D72825 Bandemia: Secondary | ICD-10-CM

## 2023-07-03 DIAGNOSIS — I739 Peripheral vascular disease, unspecified: Secondary | ICD-10-CM | POA: Diagnosis not present

## 2023-07-03 LAB — VITAMIN B12: Vitamin B-12: 253 pg/mL (ref 180–914)

## 2023-07-03 LAB — CBC
HCT: 24.9 % — ABNORMAL LOW (ref 36.0–46.0)
Hemoglobin: 8.5 g/dL — ABNORMAL LOW (ref 12.0–15.0)
MCH: 29.9 pg (ref 26.0–34.0)
MCHC: 34.1 g/dL (ref 30.0–36.0)
MCV: 87.7 fL (ref 80.0–100.0)
Platelets: 107 10*3/uL — ABNORMAL LOW (ref 150–400)
RBC: 2.84 MIL/uL — ABNORMAL LOW (ref 3.87–5.11)
RDW: 14.3 % (ref 11.5–15.5)
WBC: 4.8 10*3/uL (ref 4.0–10.5)
nRBC: 0 % (ref 0.0–0.2)

## 2023-07-03 LAB — IRON AND TIBC
Iron: 28 ug/dL (ref 28–170)
Saturation Ratios: 9 % — ABNORMAL LOW (ref 10.4–31.8)
TIBC: 312 ug/dL (ref 250–450)
UIBC: 284 ug/dL

## 2023-07-03 LAB — COMPREHENSIVE METABOLIC PANEL
ALT: 23 U/L (ref 0–44)
AST: 47 U/L — ABNORMAL HIGH (ref 15–41)
Albumin: 2.7 g/dL — ABNORMAL LOW (ref 3.5–5.0)
Alkaline Phosphatase: 46 U/L (ref 38–126)
Anion gap: 5 (ref 5–15)
BUN: 29 mg/dL — ABNORMAL HIGH (ref 8–23)
CO2: 27 mmol/L (ref 22–32)
Calcium: 9.4 mg/dL (ref 8.9–10.3)
Chloride: 103 mmol/L (ref 98–111)
Creatinine, Ser: 1.19 mg/dL — ABNORMAL HIGH (ref 0.44–1.00)
GFR, Estimated: 45 mL/min — ABNORMAL LOW (ref >60)
Glucose, Bld: 195 mg/dL — ABNORMAL HIGH (ref 70–99)
Potassium: 4.2 mmol/L (ref 3.5–5.1)
Sodium: 135 mmol/L (ref 135–145)
Total Bilirubin: 0.7 mg/dL (ref 0.3–1.2)
Total Protein: 5.4 g/dL — ABNORMAL LOW (ref 6.5–8.1)

## 2023-07-03 LAB — GLUCOSE, CAPILLARY
Glucose-Capillary: 165 mg/dL — ABNORMAL HIGH (ref 70–99)
Glucose-Capillary: 183 mg/dL — ABNORMAL HIGH (ref 70–99)
Glucose-Capillary: 235 mg/dL — ABNORMAL HIGH (ref 70–99)
Glucose-Capillary: 255 mg/dL — ABNORMAL HIGH (ref 70–99)

## 2023-07-03 LAB — RETICULOCYTES
Immature Retic Fract: 21.7 % — ABNORMAL HIGH (ref 2.3–15.9)
RBC.: 2.87 MIL/uL — ABNORMAL LOW (ref 3.87–5.11)
Retic Count, Absolute: 62.4 10*3/uL (ref 19.0–186.0)
Retic Ct Pct: 2.2 % (ref 0.4–3.1)

## 2023-07-03 LAB — FERRITIN: Ferritin: 77 ng/mL (ref 11–307)

## 2023-07-03 LAB — MAGNESIUM: Magnesium: 2 mg/dL (ref 1.7–2.4)

## 2023-07-03 LAB — FOLATE: Folate: 18.9 ng/mL (ref >5.9)

## 2023-07-03 MED ORDER — OXYCODONE HCL 5 MG PO TABS
5.0000 mg | ORAL_TABLET | Freq: Three times a day (TID) | ORAL | 0 refills | Status: AC | PRN
Start: 2023-07-03 — End: 2023-07-08

## 2023-07-03 MED ORDER — ASPIRIN 81 MG PO TBEC: 81.0000 mg | DELAYED_RELEASE_TABLET | Freq: Every day | ORAL | Status: AC

## 2023-07-03 MED ORDER — SENNOSIDES-DOCUSATE SODIUM 8.6-50 MG PO TABS: 2.0000 | ORAL_TABLET | Freq: Two times a day (BID) | ORAL | Status: DC | PRN

## 2023-07-03 MED ORDER — INSULIN ASPART 100 UNIT/ML IJ SOLN
0.0000 [IU] | Freq: Three times a day (TID) | INTRAMUSCULAR | Status: DC
  Administered 2023-07-04: 3 [IU] via SUBCUTANEOUS

## 2023-07-03 MED ORDER — HYDROCODONE-ACETAMINOPHEN 5-325 MG PO TABS
1.0000 | ORAL_TABLET | Freq: Four times a day (QID) | ORAL | Status: DC | PRN
  Administered 2023-07-04: 1 via ORAL
  Filled 2023-07-03 (×2): qty 1

## 2023-07-03 MED ORDER — ACETAMINOPHEN 500 MG PO TABS: ORAL_TABLET | ORAL | 0 refills | Status: AC

## 2023-07-03 NOTE — Progress Notes (Signed)
Physical Therapy Treatment Patient Details Name: Tracey [name] MRN: [id number] DOB: [birth year] Today's Date: 07/03/2023   History of Present Illness 87 y.o. female with medical history significant of hypertension, hyperlipidemia, dysrhythmia, CAD s/p CABG in 1995, peripheral vascular disease,diabetes mellitus type 2, CKD stage II, bilateral knee replacements presented 10/13 s/p fall and Periprosthetic left distal femur fracture.  S/p ORIF with WBAT and no ROM restrictions.    PT Comments  Pt with fair tolerance to treatment today. Pt was able to progress ambulation in hallway with RW CGA however unable to complete stair training today due to pain. Family in the process of building a ramp for pt however unsure when it will be completed. Pt unsafe for DC today. MD made aware. No change in DC/DME recs at this time.    If plan is discharge home, recommend the following: A little help with walking and/or transfers;A little help with bathing/dressing/bathroom;Assistance with cooking/housework;Direct supervision/assist for medications management;Help with stairs or ramp for entrance;Assist for transportation   Can travel by private vehicle        Equipment Recommendations  Rolling walker (2 wheels)    Recommendations for Other Services       Precautions / Restrictions Precautions Precautions: Fall Restrictions Weight Bearing Restrictions: Yes LLE Weight Bearing: Weight bearing as tolerated     Mobility  Bed Mobility Overal bed mobility: Needs Assistance Bed Mobility: Sit to Supine       Sit to supine: Min assist   General bed mobility comments: Min A for LLE management    Transfers Overall transfer level: Needs assistance Equipment used: Rolling walker (2 wheels) Transfers: Sit to/from Stand Sit to Stand: Min assist           General transfer comment: Min A from toilet    Ambulation/Gait Ambulation/Gait assistance: Contact guard assist Gait Distance (Feet): 50  Feet Assistive device: Rolling walker (2 wheels) Gait Pattern/deviations: Antalgic, Decreased stance time - left, Decreased stride length, Step-through pattern Gait velocity: decreased     General Gait Details: Pt with minimal WB on LLE. no LOB noted.   Stairs Stairs: Yes Stairs assistance: Mod assist Stair Management: Two rails, Step to pattern, Forwards, Backwards Number of Stairs: 1 General stair comments: Pt unable to complete more than 1 step due to pain. Had to bring shower chair in order for patient to sit down from step.   Wheelchair Mobility     Tilt Bed    Modified Rankin (Stroke Patients Only)       Balance Overall balance assessment: Mild deficits observed, not formally tested                                          Cognition Arousal: Alert Behavior During Therapy: WFL for tasks assessed/performed Overall Cognitive Status: Within Functional Limits for tasks assessed                                          Exercises      General Comments General comments (skin integrity, edema, etc.): VSS      Pertinent Vitals/Pain Pain Assessment Pain Assessment: Faces Faces Pain Scale: Hurts a little bit Pain Location: L knee Pain Descriptors / Indicators: Aching Pain Intervention(s): Monitored during session    Home Living  Prior Function            PT Goals (current goals can now be found in the care plan section) Progress towards PT goals: Progressing toward goals    Frequency    Min 1X/week      PT Plan      Co-evaluation              AM-PAC PT "6 Clicks" Mobility   Outcome Measure  Help needed turning from your back to your side while in a flat bed without using bedrails?: A Little Help needed moving from lying on your back to sitting on the side of a flat bed without using bedrails?: A Little Help needed moving to and from a bed to a chair (including a  wheelchair)?: A Little Help needed standing up from a chair using your arms (e.g., wheelchair or bedside chair)?: A Little Help needed to walk in hospital room?: A Little Help needed climbing 3-5 steps with a railing? : A Lot 6 Click Score: 17    End of Session Equipment Utilized During Treatment: Gait belt Activity Tolerance: Patient tolerated treatment well Patient left: in bed;with call bell/phone within reach;with bed alarm set;with family/visitor present Nurse Communication: Mobility status PT Visit Diagnosis: Other abnormalities of gait and mobility (R26.89)     Time: 1059-1130 PT Time Calculation (min) (ACUTE ONLY): 31 min  Charges:    $Gait Training: 23-37 mins PT General Charges $$ ACUTE PT VISIT: 1 Visit                     Shela Nevin, PT, DPT Acute Rehab Services [phone]    [name] 07/03/2023, 3:51 PM

## 2023-07-03 NOTE — Progress Notes (Signed)
Please call Tracey [name] [phone] for discharge planning.

## 2023-07-03 NOTE — Progress Notes (Signed)
Occupational Therapy Treatment Patient Details Name: Tracey [name] MRN: [id number] DOB: 06/02/1936 Today's Date: 07/03/2023   History of present illness 87 y.o. female with medical history significant of hypertension, hyperlipidemia, dysrhythmia, CAD s/p CABG in 1995, peripheral vascular disease,diabetes mellitus type 2, CKD stage II, bilateral knee replacements presented 10/13 s/p fall and Periprosthetic left distal femur fracture.  S/p ORIF with WBAT and no ROM restrictions.   OT comments  Pt feeling better today, no c/o of lightheadedness, BP 147/49 sitting EOB, 127/40 standing. Pt and family concerned about going home so soon after having problems with BP all day yesterday, were hoping for longer being stable prior to return home. Active listening used and informed Pt and family that since she was feeling better after the blood transfusion, MD stated they were cleared to go home as long as they did well in therapy.  Pt able to ambulate down hall and back with RW and CGA, Pt completed toileting mod I, set up for UB ADLs, min A for LB ADLs. Pt doing well, left with PT to practice stairs as Pt has 2 steps to enter home. Pt would benefit from shower seat prior to returning home to maximize safety and independence in home setting, no HHOT required as Pt is doing well with ADLs and has support at home.       If plan is discharge home, recommend the following:  Assist for transportation;Assistance with cooking/housework;A little help with bathing/dressing/bathroom;A little help with walking and/or transfers   Equipment Recommendations  Tub/shower seat    Recommendations for Other Services      Precautions / Restrictions Precautions Precautions: Fall Restrictions Weight Bearing Restrictions: Yes LLE Weight Bearing: Weight bearing as tolerated       Mobility Bed Mobility Overal bed mobility: Needs Assistance Bed Mobility: Supine to Sit     Supine to sit: Contact guard, HOB elevated,  Used rails     General bed mobility comments: CGA with rails and HOB elevated to EOB    Transfers Overall transfer level: Needs assistance Equipment used: Rolling walker (2 wheels) Transfers: Sit to/from Stand Sit to Stand: Contact guard assist     Step pivot transfers: Contact guard assist     General transfer comment: CGA for transfers     Balance Overall balance assessment: Mild deficits observed, not formally tested                                         ADL either performed or assessed with clinical judgement   ADL Overall ADL's : Needs assistance/impaired Eating/Feeding: Independent   Grooming: Set up;Sitting   Upper Body Bathing: Set up;Sitting   Lower Body Bathing: Minimal assistance;Sitting/lateral leans   Upper Body Dressing : Set up;Sitting   Lower Body Dressing: Minimal assistance;Sit to/from stand   Toilet Transfer: Contact guard assist;Rolling walker (2 wheels);Ambulation;Comfort height toilet   Toileting- Clothing Manipulation and Hygiene: Set up;Sitting/lateral lean       Functional mobility during ADLs: Contact guard assist;Rolling walker (2 wheels) General ADL Comments: Pt min A for LB ADLs, CGA for mobility    Extremity/Trunk Assessment Upper Extremity Assessment Upper Extremity Assessment: Overall WFL for tasks assessed            Vision       Perception     Praxis      Cognition Arousal: Alert Behavior During Therapy: [facility] for  tasks assessed/performed Overall Cognitive Status: Within Functional Limits for tasks assessed                                          Exercises      Shoulder Instructions       General Comments BP pressure sitting EOB 147/49, standing 127/40, no SOB, no lightheadness, able to ambulate down hall and to restroom.    Pertinent Vitals/ Pain       Pain Assessment Pain Assessment: Faces Faces Pain Scale: Hurts a little bit Pain Location: L knee Pain Descriptors  / Indicators: Aching Pain Intervention(s): Monitored during session  Home Living                                          Prior Functioning/Environment              Frequency  Min 1X/week        Progress Toward Goals  OT Goals(current goals can now be found in the care plan section)  Progress towards OT goals: Progressing toward goals  Acute Rehab OT Goals Patient Stated Goal: to improve with mobility OT Goal Formulation: With patient/family Time For Goal Achievement: 07/15/23 Potential to Achieve Goals: Good ADL Goals Pt Will Perform Grooming: standing;with set-up Pt Will Perform Lower Body Dressing: with min assist;sit to/from stand;with adaptive equipment Pt Will Transfer to Toilet: ambulating;regular height toilet;with contact guard assist  Plan      Co-evaluation                 AM-PAC OT "6 Clicks" Daily Activity     Outcome Measure   Help from another person eating meals?: None Help from another person taking care of personal grooming?: A Little Help from another person toileting, which includes using toliet, bedpan, or urinal?: A Little Help from another person bathing (including washing, rinsing, drying)?: A Little Help from another person to put on and taking off regular upper body clothing?: None Help from another person to put on and taking off regular lower body clothing?: A Little 6 Click Score: 20    End of Session Equipment Utilized During Treatment: Gait belt;Rolling walker (2 wheels)  OT Visit Diagnosis: Unsteadiness on feet (R26.81);Pain Pain - Right/Left: Left Pain - part of body: Knee   Activity Tolerance Patient tolerated treatment well   Patient Left Other (comment);with family/visitor present (left with PT)   Nurse Communication Mobility status        Time: 1030-1059 OT Time Calculation (min): 29 min  Charges: OT General Charges $OT Visit: 1 Visit OT Treatments $Self Care/Home Management : 23-37  mins  Deyonna Fitzsimmons, OTR/L   Alexis Goodell 07/03/2023, 11:26 AM

## 2023-07-03 NOTE — Plan of Care (Signed)

## 2023-07-03 NOTE — Progress Notes (Signed)
Ortho Trauma Note  Doing well.  Had issues yesterday with low blood pressure did get a blood transfusion and is feeling better.  No major issues overnight.  Denies any chest pain.  Pain is well-controlled in her leg.  Physical exam: Dressing was removed this morning.  Incisions are clean dry and intact swelling is appropriate.  She is active dorsiflexion plantarflexion of the foot and ankle.  Warm well-perfused foot with 2+ DP pulses.  Imaging: Stable postoperatively.  Results for orders placed or performed during the hospital encounter of 06/30/23 (from the past 24 hour(s))  CBC     Status: Abnormal   Collection Time: 07/02/23  8:34 AM  Result Value Ref Range   WBC 5.3 4.0 - 10.5 K/uL   RBC 2.52 (L) 3.87 - 5.11 MIL/uL   Hemoglobin 7.3 (L) 12.0 - 15.0 g/dL   HCT 84.1 (L) 32.4 - 40.1 %   MCV 90.1 80.0 - 100.0 fL   MCH 29.0 26.0 - 34.0 pg   MCHC 32.2 30.0 - 36.0 g/dL   RDW 02.7 25.3 - 66.4 %   Platelets 109 (L) 150 - 400 K/uL   nRBC 0.0 0.0 - 0.2 %  Comprehensive metabolic panel     Status: Abnormal   Collection Time: 07/02/23  8:34 AM  Result Value Ref Range   Sodium 135 135 - 145 mmol/L   Potassium 4.2 3.5 - 5.1 mmol/L   Chloride 102 98 - 111 mmol/L   CO2 26 22 - 32 mmol/L   Glucose, Bld 171 (H) 70 - 99 mg/dL   BUN 24 (H) 8 - 23 mg/dL   Creatinine, Ser 4.03 (H) 0.44 - 1.00 mg/dL   Calcium 9.6 8.9 - 47.4 mg/dL   Total Protein 5.3 (L) 6.5 - 8.1 g/dL   Albumin 2.9 (L) 3.5 - 5.0 g/dL   AST 22 15 - 41 U/L   ALT 13 0 - 44 U/L   Alkaline Phosphatase 41 38 - 126 U/L   Total Bilirubin 0.6 0.3 - 1.2 mg/dL   GFR, Estimated 47 (L) >60 mL/min   Anion gap 7 5 - 15  Glucose, capillary     Status: Abnormal   Collection Time: 07/02/23 11:35 AM  Result Value Ref Range   Glucose-Capillary 188 (H) 70 - 99 mg/dL  Prepare RBC (crossmatch)     Status: None   Collection Time: 07/02/23  3:25 PM  Result Value Ref Range   Order Confirmation      ORDER PROCESSED BY BLOOD BANK Performed at [facility], [address] [place], [place] [address]   Glucose, capillary     Status: Abnormal   Collection Time: 07/02/23  4:22 PM  Result Value Ref Range   Glucose-Capillary 222 (H) 70 - 99 mg/dL  Glucose, capillary     Status: Abnormal   Collection Time: 07/02/23  8:37 PM  Result Value Ref Range   Glucose-Capillary 206 (H) 70 - 99 mg/dL  Comprehensive metabolic panel     Status: Abnormal   Collection Time: 07/03/23  6:42 AM  Result Value Ref Range   Sodium 135 135 - 145 mmol/L   Potassium 4.2 3.5 - 5.1 mmol/L   Chloride 103 98 - 111 mmol/L   CO2 27 22 - 32 mmol/L   Glucose, Bld 195 (H) 70 - 99 mg/dL   BUN 29 (H) 8 - 23 mg/dL   Creatinine, Ser 3.87 (H) 0.44 - 1.00 mg/dL   Calcium 9.4 8.9 - 10.3  mg/dL   Total Protein 5.4 (L) 6.5 - 8.1 g/dL   Albumin 2.7 (L) 3.5 - 5.0 g/dL   AST 47 (H) 15 - 41 U/L   ALT 23 0 - 44 U/L   Alkaline Phosphatase 46 38 - 126 U/L   Total Bilirubin 0.7 0.3 - 1.2 mg/dL   GFR, Estimated 45 (L) >60 mL/min   Anion gap 5 5 - 15  CBC     Status: Abnormal   Collection Time: 07/03/23  6:42 AM  Result Value Ref Range   WBC 4.8 4.0 - 10.5 K/uL   RBC 2.84 (L) 3.87 - 5.11 MIL/uL   Hemoglobin 8.5 (L) 12.0 - 15.0 g/dL   HCT 25.3 (L) 66.4 - 40.3 %   MCV 87.7 80.0 - 100.0 fL   MCH 29.9 26.0 - 34.0 pg   MCHC 34.1 30.0 - 36.0 g/dL   RDW 47.4 25.9 - 56.3 %   Platelets 107 (L) 150 - 400 K/uL   nRBC 0.0 0.0 - 0.2 %  Magnesium     Status: None   Collection Time: 07/03/23  6:42 AM  Result Value Ref Range   Magnesium 2.0 1.7 - 2.4 mg/dL  Glucose, capillary     Status: Abnormal   Collection Time: 07/03/23  7:15 AM  Result Value Ref Range   Glucose-Capillary 165 (H) 70 - 99 mg/dL    A/P 87 year old female status post open duction internal fixation of left periprosthetic distal femur fracture.  Weightbearing as tolerated left lower extremity Range of motion as tolerated PT OT-likely will discharge with home health therapy. Monitor CBC is stable this  morning Aspirin 81 mg and Plavix for DVT prophylaxis Follow-up with me in approximately 2 weeks for repeat x-rays and wound check.  Roby Lofts, MD [facility] [phone] (office) [email or web address]

## 2023-07-03 NOTE — Progress Notes (Addendum)
S/p ORIF left distal  femur 10/15   07/03/23 1043  TOC Brief Assessment  Insurance and Status Reviewed  Patient has primary care physician Yes  Home environment has been reviewed From home with husband. Supportive family.  Prior level of function: PTA independent with AD's, no DME usage.  Prior/Current Home Services No current home services  Social Determinants of Health Reivew SDOH reviewed no interventions necessary  Readmission risk has been reviewed No  Transition of care needs transition of care needs identified, TOC will continue to follow   D/C readiness once clear by therapy. Pt with supportive family. Orders noted for home health and DME needs. Pt agreeable to home health services/ dme: RW, Shower chair. Pt without provider preference. Referral made with Amy[facility] and accepted. Referral also made with Jermaine/ [facility] for RW, shower chair, equipment will be delivered to bedside prior to d/c. Pt without RX med concerns or transportation issues. Post hospital f/u noted on AVS. TOC  team following and will continue to assist with needs.Gae Gallop RN,BSN,CM [phone]  07/03/2023 11:22 am NCM made pt aware of out pocket expense ( $ 64.00) for shower chair, insurance will not cover. Pt declined shower chair.

## 2023-07-03 NOTE — Progress Notes (Signed)
PROGRESS NOTE  Tracey [name] NFA:[id number] DOB: [birth year]   PCP: [name], MD  Patient is from: Home.  DOA: 06/30/2023 LOS: 3  Chief complaints No chief complaint on file.    Brief Narrative / Interim history: 87 year old female with HTN, HLD, CAD s/p CABG 1995, PVD, DM 2, CKD-3A, b/l TKA and arthritis comes into the hospital with left knee pain after she fell going down steps.  She immediately felt a pop in her knee and had significant pain, was unable to get up. There is no head trauma or LOC. She is on Plavix. X-rays revealed a comminuted periprosthetic fracture of the distal left femur. She was admitted to the hospital and orthopedic surgery was consulted.   Patient underwent ORIF by Dr. Jena Gauss on 10/14.  Postop course complicated by ABLA although reported EBL is only 150.  It seems like there is also some element of hemodilution.  H&H is now stable.  Medically stable for discharge home once cleared by physical therapy.  Subjective: Seen and examined earlier this morning.  No major events overnight of this morning.  Patient has no complaint and feels great.  Family worried about discharge today.  Granddaughter tells me that she was dizzy and awake when she attempted to work with therapy yesterday.  Objective: Vitals:   07/02/23 1717 07/02/23 2022 07/03/23 0511 07/03/23 0713  BP: (!) 109/35 (!) 120/39 102/68 (!) 129/45  Pulse: 72 77 61 60  Resp: 18 18 18 17   Temp: 99.1 F (37.3 C) 99.7 F (37.6 C)  98.5 F (36.9 C)  TempSrc: Oral Oral  Oral  SpO2:  97% 99% 100%  Weight:      Height:        Examination:  GENERAL: No apparent distress.  Nontoxic. HEENT: MMM.  Vision and hearing grossly intact.  NECK: Supple.  No apparent JVD.  RESP:  No IWOB.  Fair aeration bilaterally. CVS:  RRR. Heart sounds normal.  ABD/GI/GU: BS+. Abd soft, NTND.  MSK/EXT:  Moves extremities.  Surgical wounds DCI. SKIN: no apparent skin lesion or wound NEURO: Awake, alert and oriented  appropriately.  No apparent focal neuro deficit. PSYCH: Calm. Normal affect.   Procedures:  10/14-ORIF of left femoral fracture  Microbiology summarized: MRSA PCR screen negative  Assessment and plan: Principal Problem:   Periprosthetic fracture around prosthetic knee Active Problems:   Fall down steps   Leukocytosis   Pancytopenia (HCC)   History of cardiac dysrhythmia   Status post aorto-coronary artery bypass graft   PAD (peripheral artery disease) (HCC)   Essential hypertension, benign   Type 2 diabetes mellitus with stage 2 chronic kidney disease, without long-term current use of insulin (HCC)   Hyperlipidemia associated with type 2 diabetes mellitus (HCC)   GERD (gastroesophageal reflux disease)  Periprosthetic left distal femur fracture secondary to fall: Did not strike head or LOC.  Imaging on admission revealed left periprosthetic distal femur fracture.  She has a history of left TKA in 12/2019.  -S/p ORIF by Dr. Jena Gauss on 10/14.  -Pain control and bowel regimen -Aspirin in addition to home Plavix for DVT prophylaxis per Dr. Jena Gauss.  -Therapy recommended home health but yet to clear since she was unable to do stair   Postoperative blood loss anemia: Hgb dropped from 11.5 to 7.3 but improved to 8.5.  Operative EBL only 150.  No obvious bleeding.  Some element of hemodilution. Recent Labs    11/13/22 1139 06/29/23 1555 07/01/23 0454 07/02/23 4696 07/03/23 2952  HGB 12.2 11.5* 8.8* 7.3* 8.5*  -Check anemia panel  Uncontrolled NIDDM-2 with hyperglycemia: A1c 8.1%. Recent Labs  Lab 07/02/23 1135 07/02/23 1622 07/02/23 2037 07/03/23 0715 07/03/23 1129  GLUCAP 188* 222* 206* 165* 183*  -Increase SSI to moderate -Continue Semglee 5 units daily.  Will titrate this up in the morning if not at goal   Pancytopenia-ruled out.   History of SVT-appears to be in sinus rhythm.   CAD s/p CABG in 1995, PAD, HLD -Continue home meds.   Essential hypertension-Per OT,  orthostatic vitals positive but not symptomatic.   -On Coreg and lisinopril.   CKD-3A: Stable Recent Labs    11/13/22 1139 06/19/23 0922 06/29/23 1555 07/01/23 0454 07/02/23 0834 07/03/23 0642  BUN 24* 29* 32* 29* 24* 29*  CREATININE 1.04 1.08* 0.96 1.12* 1.15* 1.19*  -Continue monitoring  Thrombocytopenia: Stable. -Continue monitoring  Body mass index is 22.67 kg/m.          DVT prophylaxis:  Low-dose aspirin  Code Status: Full code Family Communication: Updated patient's husband, daughter and granddaughter at bedside Level of care: Telemetry Medical Status is: Inpatient Remains inpatient appropriate because: Pending clearance by physical therapy   Final disposition: Home with home health and DME Consultants:  Orthopedic surgery  35 minutes with more than 50% spent in reviewing records, counseling patient/family and coordinating care.   Sch Meds:  Scheduled Meds:  aspirin EC  81 mg Oral Daily   carvedilol  3.125 mg Oral BID WC   clopidogrel  75 mg Oral Daily   ezetimibe  10 mg Oral Daily   insulin aspart  0-15 Units Subcutaneous TID WC   insulin aspart  0-5 Units Subcutaneous QHS   insulin glargine-yfgn  5 Units Subcutaneous Daily   pantoprazole  40 mg Oral Daily   rosuvastatin  40 mg Oral Daily   senna-docusate  1 tablet Oral BID   Continuous Infusions: PRN Meds:.HYDROcodone-acetaminophen, melatonin, morphine injection, ondansetron (ZOFRAN) IV  Antimicrobials: Anti-infectives (From admission, onward)    Start     Dose/Rate Route Frequency Ordered Stop   07/01/23 1400  ceFAZolin (ANCEF) IVPB 2g/100 mL premix        2 g 200 mL/hr over 30 Minutes Intravenous Every 8 hours 07/01/23 1007 07/02/23 0630   07/01/23 0900  vancomycin (VANCOCIN) powder  Status:  Discontinued          As needed 07/01/23 1118 07/01/23 1118        I have personally reviewed the following labs and images: CBC: Recent Labs  Lab 06/29/23 1555 07/01/23 0454 07/02/23 0834  07/03/23 0642  WBC 11.0* 7.0 5.3 4.8  HGB 11.5* 8.8* 7.3* 8.5*  HCT 35.2* 26.7* 22.7* 24.9*  MCV 91.7 91.1 90.1 87.7  PLT 149* 132* 109* 107*   BMP &GFR Recent Labs  Lab 06/29/23 1555 07/01/23 0454 07/02/23 0834 07/03/23 0642  NA 136 134* 135 135  K 4.6 4.0 4.2 4.2  CL 104 103 102 103  CO2 23 23 26 27   GLUCOSE 244* 138* 171* 195*  BUN 32* 29* 24* 29*  CREATININE 0.96 1.12* 1.15* 1.19*  CALCIUM 10.3 9.4 9.6 9.4  MG  --   --   --  2.0   Estimated Creatinine Clearance: 29.1 mL/min (A) (by C-G formula based on SCr of 1.19 mg/dL (H)). Liver & Pancreas: Recent Labs  Lab 06/29/23 1555 07/02/23 0834 07/03/23 0642  AST 37 22 47*  ALT 30 13 23   ALKPHOS 51 41 46  BILITOT 0.9  0.6 0.7  PROT 7.3 5.3* 5.4*  ALBUMIN 4.3 2.9* 2.7*   No results for input(s): "LIPASE", "AMYLASE" in the last 168 hours. No results for input(s): "AMMONIA" in the last 168 hours. Diabetic: No results for input(s): "HGBA1C" in the last 72 hours. Recent Labs  Lab 07/02/23 1135 07/02/23 1622 07/02/23 2037 07/03/23 0715 07/03/23 1129  GLUCAP 188* 222* 206* 165* 183*   Cardiac Enzymes: No results for input(s): "CKTOTAL", "CKMB", "CKMBINDEX", "TROPONINI" in the last 168 hours. No results for input(s): "PROBNP" in the last 8760 hours. Coagulation Profile: Recent Labs  Lab 06/29/23 1555  INR 1.1   Thyroid Function Tests: No results for input(s): "TSH", "T4TOTAL", "FREET4", "T3FREE", "THYROIDAB" in the last 72 hours. Lipid Profile: No results for input(s): "CHOL", "HDL", "LDLCALC", "TRIG", "CHOLHDL", "LDLDIRECT" in the last 72 hours. Anemia Panel: No results for input(s): "VITAMINB12", "FOLATE", "FERRITIN", "TIBC", "IRON", "RETICCTPCT" in the last 72 hours. Urine analysis:    Component Value Date/Time   COLORURINE YELLOW 08/16/2021 1138   APPEARANCEUR CLEAR 08/16/2021 1138   APPEARANCEUR Cloudy (A) 06/12/2017 1004   LABSPEC 1.020 08/16/2021 1138   LABSPEC 1.021 03/09/2013 1516   PHURINE 5.5  08/16/2021 1138   GLUCOSEU NEGATIVE 08/16/2021 1138   HGBUR NEGATIVE 08/16/2021 1138   BILIRUBINUR neg 02/22/2023 1539   BILIRUBINUR Negative 06/12/2017 1004   BILIRUBINUR Negative 03/09/2013 1516   KETONESUR TRACE (A) 08/16/2021 1138   KETONESUR trace (5) (A) 08/16/2021 1125   PROTEINUR Negative 02/22/2023 1539   PROTEINUR 30 (A) 12/29/2019 1338   UROBILINOGEN 0.2 02/22/2023 1539   UROBILINOGEN 1.0 08/16/2021 1138   NITRITE neg 02/22/2023 1539   NITRITE NEGATIVE 08/16/2021 1138   LEUKOCYTESUR Small (1+) (A) 02/22/2023 1539   LEUKOCYTESUR MODERATE (A) 08/16/2021 1138   LEUKOCYTESUR Negative 03/09/2013 1516   Sepsis Labs: Invalid input(s): "PROCALCITONIN", "LACTICIDVEN"  Microbiology: No results found for this or any previous visit (from the past 240 hour(s)).  Radiology Studies: No results found.    Karlis Cregg T. Letroy Vazguez [facility]  If 7PM-7AM, please contact night-coverage [email or web address] 07/03/2023, 1:21 PM

## 2023-07-03 NOTE — Care Management Important Message (Signed)
Important Message  Patient Details  Name: Tracey [name] MRN: [id number] Date of Birth: 1935/10/15   Important Message Given:  Yes - Medicare IM     Sherilyn Banker 07/03/2023, 4:36 PM

## 2023-07-04 ENCOUNTER — Telehealth: Payer: Self-pay | Admitting: *Deleted

## 2023-07-04 DIAGNOSIS — W108XXA Fall (on) (from) other stairs and steps, initial encounter: Secondary | ICD-10-CM | POA: Diagnosis not present

## 2023-07-04 DIAGNOSIS — E1169 Type 2 diabetes mellitus with other specified complication: Secondary | ICD-10-CM | POA: Diagnosis not present

## 2023-07-04 DIAGNOSIS — E1122 Type 2 diabetes mellitus with diabetic chronic kidney disease: Secondary | ICD-10-CM | POA: Diagnosis not present

## 2023-07-04 DIAGNOSIS — M9712XA Periprosthetic fracture around internal prosthetic left knee joint, initial encounter: Secondary | ICD-10-CM | POA: Diagnosis not present

## 2023-07-04 LAB — RENAL FUNCTION PANEL
Albumin: 2.7 g/dL — ABNORMAL LOW (ref 3.5–5.0)
Anion gap: 6 (ref 5–15)
BUN: 24 mg/dL — ABNORMAL HIGH (ref 8–23)
CO2: 24 mmol/L (ref 22–32)
Calcium: 9.1 mg/dL (ref 8.9–10.3)
Chloride: 105 mmol/L (ref 98–111)
Creatinine, Ser: 1.14 mg/dL — ABNORMAL HIGH (ref 0.44–1.00)
GFR, Estimated: 47 mL/min — ABNORMAL LOW (ref >60)
Glucose, Bld: 192 mg/dL — ABNORMAL HIGH (ref 70–99)
Phosphorus: 1.9 mg/dL — ABNORMAL LOW (ref 2.5–4.6)
Potassium: 4.5 mmol/L (ref 3.5–5.1)
Sodium: 135 mmol/L (ref 135–145)

## 2023-07-04 LAB — CBC
HCT: 23.9 % — ABNORMAL LOW (ref 36.0–46.0)
Hemoglobin: 8.1 g/dL — ABNORMAL LOW (ref 12.0–15.0)
MCH: 29.3 pg (ref 26.0–34.0)
MCHC: 33.9 g/dL (ref 30.0–36.0)
MCV: 86.6 fL (ref 80.0–100.0)
Platelets: 117 10*3/uL — ABNORMAL LOW (ref 150–400)
RBC: 2.76 MIL/uL — ABNORMAL LOW (ref 3.87–5.11)
RDW: 13.9 % (ref 11.5–15.5)
WBC: 4.9 10*3/uL (ref 4.0–10.5)
nRBC: 0 % (ref 0.0–0.2)

## 2023-07-04 LAB — MAGNESIUM: Magnesium: 1.8 mg/dL (ref 1.7–2.4)

## 2023-07-04 LAB — GLUCOSE, CAPILLARY: Glucose-Capillary: 179 mg/dL — ABNORMAL HIGH (ref 70–99)

## 2023-07-04 MED ORDER — CYANOCOBALAMIN 1000 MCG PO TABS: 1000.0000 ug | ORAL_TABLET | Freq: Every day | ORAL | Status: AC

## 2023-07-04 MED ORDER — ONDANSETRON 4 MG PO TBDP: ORAL_TABLET | ORAL | 0 refills | Status: DC

## 2023-07-04 MED ORDER — FERROUS SULFATE 325 (65 FE) MG PO TABS
325.0000 mg | ORAL_TABLET | Freq: Two times a day (BID) | ORAL | Status: DC
  Administered 2023-07-04: 325 mg via ORAL
  Filled 2023-07-04: qty 1

## 2023-07-04 MED ORDER — FERROUS SULFATE 325 (65 FE) MG PO TABS: 325.0000 mg | ORAL_TABLET | Freq: Two times a day (BID) | ORAL | Status: DC

## 2023-07-04 MED ORDER — VITAMIN B-12 1000 MCG PO TABS
1000.0000 ug | ORAL_TABLET | Freq: Every day | ORAL | Status: DC
  Administered 2023-07-04: 1000 ug via ORAL
  Filled 2023-07-04: qty 1

## 2023-07-04 NOTE — Progress Notes (Signed)
Physical Therapy Treatment Patient Details Name: Tracey [name] MRN: [id number] DOB: 01-Feb-1936 Today's Date: 07/04/2023   History of Present Illness 87 y.o. female with medical history significant of hypertension, hyperlipidemia, dysrhythmia, CAD s/p CABG in 1995, peripheral vascular disease,diabetes mellitus type 2, CKD stage II, bilateral knee replacements presented 10/13 s/p fall and Periprosthetic left distal femur fracture.  S/p ORIF with WBAT and no ROM restrictions.    PT Comments  Pt tolerated treatment well today. Pt able to progress ambulation today with RW sueprvision and noted with more weightbearing through LLE. Family states that ramp has been built at home so no longer need to practice steps. Pt mobility is adequate for DC home today. No change in DC/DME recs at this time. Pt anticipates DC home today.   If plan is discharge home, recommend the following: A little help with walking and/or transfers;A little help with bathing/dressing/bathroom;Assistance with cooking/housework;Direct supervision/assist for medications management;Help with stairs or ramp for entrance;Assist for transportation   Can travel by private vehicle        Equipment Recommendations  Rolling walker (2 wheels)    Recommendations for Other Services       Precautions / Restrictions Precautions Precautions: Fall Restrictions Weight Bearing Restrictions: Yes LLE Weight Bearing: Weight bearing as tolerated     Mobility  Bed Mobility Overal bed mobility: Needs Assistance Bed Mobility: Supine to Sit     Supine to sit: Supervision, HOB elevated, Used rails     General bed mobility comments: no physical assist needed.    Transfers Overall transfer level: Needs assistance Equipment used: Rolling walker (2 wheels) Transfers: Sit to/from Stand Sit to Stand: Contact guard assist           General transfer comment: Cues for hand placement.    Ambulation/Gait Ambulation/Gait assistance:  Supervision Gait Distance (Feet): 50 Feet Assistive device: Rolling walker (2 wheels) Gait Pattern/deviations: Antalgic, Decreased stance time - left, Decreased stride length, Step-through pattern Gait velocity: decreased     General Gait Details: no LOB noted. Increased WB on LLE today.   Stairs             Wheelchair Mobility     Tilt Bed    Modified Rankin (Stroke Patients Only)       Balance Overall balance assessment: Mild deficits observed, not formally tested                                          Cognition Arousal: Alert Behavior During Therapy: WFL for tasks assessed/performed Overall Cognitive Status: Within Functional Limits for tasks assessed                                          Exercises      General Comments General comments (skin integrity, edema, etc.): VSS      Pertinent Vitals/Pain Pain Assessment Pain Assessment: No/denies pain    Home Living                          Prior Function            PT Goals (current goals can now be found in the care plan section) Progress towards PT goals: Progressing toward goals    Frequency  Min 1X/week      PT Plan      Co-evaluation              AM-PAC PT "6 Clicks" Mobility   Outcome Measure  Help needed turning from your back to your side while in a flat bed without using bedrails?: A Little Help needed moving from lying on your back to sitting on the side of a flat bed without using bedrails?: A Little Help needed moving to and from a bed to a chair (including a wheelchair)?: A Little Help needed standing up from a chair using your arms (e.g., wheelchair or bedside chair)?: A Little Help needed to walk in hospital room?: A Little Help needed climbing 3-5 steps with a railing? : A Lot 6 Click Score: 17    End of Session Equipment Utilized During Treatment: Gait belt Activity Tolerance: Patient tolerated treatment  well Patient left: in bed;with call bell/phone within reach Nurse Communication: Mobility status PT Visit Diagnosis: Other abnormalities of gait and mobility (R26.89)     Time: 1610-9604 PT Time Calculation (min) (ACUTE ONLY): 12 min  Charges:    $Gait Training: 8-22 mins PT General Charges $$ ACUTE PT VISIT: 1 Visit                     Shela Nevin, PT, DPT Acute Rehab Services [phone]    Tracey [name] 07/04/2023, 11:00 AM

## 2023-07-04 NOTE — Anesthesia Postprocedure Evaluation (Signed)
Anesthesia Post Note  Patient: Tracey [name]  Procedure(s) Performed: OPEN REDUCTION INTERNAL FIXATION (ORIF) DISTAL FEMUR FRACTURE (Left: Leg Lower)     Patient location during evaluation: PACU Anesthesia Type: General Level of consciousness: awake and alert Pain management: pain level controlled Vital Signs Assessment: post-procedure vital signs reviewed and stable Respiratory status: spontaneous breathing, nonlabored ventilation, respiratory function stable and patient connected to nasal cannula oxygen Cardiovascular status: blood pressure returned to baseline and stable Postop Assessment: no apparent nausea or vomiting Anesthetic complications: no   No notable events documented.               Cameron Schwinn

## 2023-07-04 NOTE — Plan of Care (Signed)
Patient alert/oriented X4. Patient compliant with medication administration. PIV removed prior to discharge and AVS discharge instructions explained in detail. Patient has no complaints at this time, VSS upon discharge.   Problem: Education: Goal: Knowledge of General Education information will improve Description: Including pain rating scale, medication(s)/side effects and non-pharmacologic comfort measures Outcome: Adequate for Discharge   Problem: Health Behavior/Discharge Planning: Goal: Ability to manage health-related needs will improve Outcome: Adequate for Discharge   Problem: Clinical Measurements: Goal: Ability to maintain clinical measurements within normal limits will improve Outcome: Adequate for Discharge   Problem: Clinical Measurements: Goal: Will remain free from infection Outcome: Adequate for Discharge   Problem: Clinical Measurements: Goal: Diagnostic test results will improve Outcome: Adequate for Discharge   Problem: Clinical Measurements: Goal: Respiratory complications will improve Outcome: Adequate for Discharge   Problem: Clinical Measurements: Goal: Cardiovascular complication will be avoided Outcome: Adequate for Discharge   Problem: Activity: Goal: Risk for activity intolerance will decrease Outcome: Adequate for Discharge   Problem: Nutrition: Goal: Adequate nutrition will be maintained Outcome: Adequate for Discharge   Problem: Coping: Goal: Level of anxiety will decrease Outcome: Adequate for Discharge   Problem: Elimination: Goal: Will not experience complications related to bowel motility Outcome: Adequate for Discharge   Problem: Elimination: Goal: Will not experience complications related to urinary retention Outcome: Adequate for Discharge   Problem: Pain Managment: Goal: General experience of comfort will improve Outcome: Adequate for Discharge   Problem: Safety: Goal: Ability to remain free from injury will  improve Outcome: Adequate for Discharge   Problem: Skin Integrity: Goal: Risk for impaired skin integrity will decrease Outcome: Adequate for Discharge   Problem: Education: Goal: Ability to describe self-care measures that may prevent or decrease complications (Diabetes Survival Skills Education) will improve Outcome: Adequate for Discharge   Problem: Education: Goal: Individualized Educational Video(s) Outcome: Adequate for Discharge   Problem: Coping: Goal: Ability to adjust to condition or change in health will improve Outcome: Adequate for Discharge   Problem: Fluid Volume: Goal: Ability to maintain a balanced intake and output will improve Outcome: Adequate for Discharge   Problem: Health Behavior/Discharge Planning: Goal: Ability to identify and utilize available resources and services will improve Outcome: Adequate for Discharge   Problem: Health Behavior/Discharge Planning: Goal: Ability to manage health-related needs will improve Outcome: Adequate for Discharge   Problem: Metabolic: Goal: Ability to maintain appropriate glucose levels will improve Outcome: Adequate for Discharge   Problem: Nutritional: Goal: Maintenance of adequate nutrition will improve Outcome: Adequate for Discharge   Problem: Nutritional: Goal: Progress toward achieving an optimal weight will improve Outcome: Adequate for Discharge   Problem: Skin Integrity: Goal: Risk for impaired skin integrity will decrease Outcome: Adequate for Discharge   Problem: Tissue Perfusion: Goal: Adequacy of tissue perfusion will improve Outcome: Adequate for Discharge

## 2023-07-04 NOTE — Telephone Encounter (Signed)
Angelica Chessman (granddaughter) sent a message pt is on her way home from the hospital after a fall, per [facility] the hospital forgot to send in Zofran. Pt has to take it with her pain meds due to the nausea the pain meds cause. Angelica Chessman is asking if our office could send in one time Rx of Zofran. Per Dr. Ermalene Searing since PCP is out of the office it's okay to send in Rx. Done and [facility] aware

## 2023-07-04 NOTE — Plan of Care (Signed)

## 2023-07-04 NOTE — Discharge Summary (Signed)
Physician Discharge Summary  Tracey [name] HQI:[id number] DOB: [birth year] DOA: 06/30/2023  PCP: [name], MD  Admit date: 06/30/2023 Discharge date: 07/04/2023 Admitted From: Home Disposition: Home Recommendations for Outpatient Follow-up:  Follow up with PCP in 1 week Outpatient follow-up with orthopedic surgery as below Check CMP and CBC at follow-up Please follow up on the following pending results: None  Home Health: PT/OT Equipment/Devices: Rolling walker and shower chair  Discharge Condition: Stable CODE STATUS: Full code  Follow-up Information     Tower, Audrie Gallus, MD. Schedule an appointment as soon as possible for a visit in 1 week(s).   Specialties: Family Medicine, Radiology Contact information: [address] [place] [address] [phone]                 Hospital course 87 year old female with HTN, HLD, CAD s/p CABG 1995, PVD, DM 2, CKD-3A, b/l TKA and arthritis comes into the hospital with left knee pain after she fell going down steps.  She immediately felt a pop in her knee and had significant pain, was unable to get up. There is no head trauma or LOC. She is on Plavix. X-rays revealed a comminuted periprosthetic fracture of the distal left femur. She was admitted to the hospital and orthopedic surgery was consulted.    Patient underwent ORIF by Dr. Jena Gauss on 10/14.  Postop course complicated by ABLA although reported EBL is only 150.  It seems like there is also some element of hemodilution.  H&H improved on subsequent CBC.  Anemia panel with some degree of iron deficiency.  Started on p.o. iron and vitamin B12.   Patient discharged home with home health PT/OT as well as rolling walker and shower chair per recommendation by therapy.  Outpatient follow-up with orthopedic surgery in 2 weeks.    See individual problem list below for more.   Problems addressed during this hospitalization Principal Problem:   Periprosthetic fracture  around prosthetic knee Active Problems:   Fall down steps   Leukocytosis   History of cardiac dysrhythmia   Status post aorto-coronary artery bypass graft   PAD (peripheral artery disease) (HCC)   Essential hypertension, benign   Type 2 diabetes mellitus with stage 2 chronic kidney disease, without long-term current use of insulin (HCC)   Hyperlipidemia associated with type 2 diabetes mellitus (HCC)   GERD (gastroesophageal reflux disease)   Periprosthetic left distal femur fracture secondary to fall: Did not strike head or LOC.  Imaging on admission revealed left periprosthetic distal femur fracture.  She has a history of left TKA in 12/2019.  -S/p ORIF by Dr. Jena Gauss on 10/14.  -Scheduled Tylenol with as needed oxycodone for pain control -Aspirin in addition to home Plavix for DVT prophylaxis per Dr. Jena Gauss.  -HH PT/OT, rolling walker and shower chair -Outpatient follow-up with orthopedic surgery in 2 weeks   Postoperative blood loss anemia: Hgb dropped from 11.5 to 7.3 but improved to 8.5.  Operative EBL only 150.  No obvious bleeding.  Some element of hemodilution.  Anemia panel with some iron deficiency and borderline B12. Recent Labs    11/13/22 1139 06/29/23 1555 07/01/23 0454 07/02/23 0834 07/03/23 0642 07/04/23 0722  HGB 12.2 11.5* 8.8* 7.3* 8.5* 8.1*  -Discharge on p.o. ferrous sulfate and vitamin B12 -Bowel regimen  Uncontrolled NIDDM-2 with hyperglycemia: A1c 8.1%. -Continue home meds   Pancytopenia-ruled out.   History of SVT-appears to be in sinus rhythm.   CAD s/p CABG in 1995, PAD, HLD -  Continue home meds.   Essential hypertension-Per OT, orthostatic vitals positive but not symptomatic.   -On Coreg and lisinopril.    CKD-3A: Stable  Thrombocytopenia: Improving.           Time spent 35 minutes  Vital signs Vitals:   07/03/23 1324 07/03/23 2024 07/04/23 0455 07/04/23 0717  BP: 97/66 (!) 131/42 (!) 129/46 (!) 120/40  Pulse: 61 72 72 64  Temp:  (!) 97.5 F (36.4 C) 98 F (36.7 C) 99 F (37.2 C) 98.8 F (37.1 C)  Resp: 17   16  Height:      Weight:      SpO2: 98% 97% 94% 96%  TempSrc: Oral  Oral Oral  BMI (Calculated):         Discharge exam  GENERAL: No apparent distress.  Nontoxic. HEENT: MMM.  Vision and hearing grossly intact.  NECK: Supple.  No apparent JVD.  RESP:  No IWOB.  Fair aeration bilaterally. CVS:  RRR. Heart sounds normal.  ABD/GI/GU: BS+. Abd soft, NTND.  MSK/EXT:  Moves extremities.  Surgical wounds DCI. SKIN: no apparent skin lesion or wound NEURO: Awake, alert and oriented appropriately.  No apparent focal neuro deficit. PSYCH: Calm. Normal affect.   Discharge Instructions Discharge Instructions     Diet - low sodium heart healthy   Complete by: As directed    Diet Carb Modified   Complete by: As directed    Discharge instructions   Complete by: As directed    It has been a pleasure taking care of you!  You were hospitalized due to fracture for which you have been treated surgically.  Follow-up with your orthopedic surgeon in 2 weeks.  Take your medications as prescribed.  Please review your new medication list and the directions on your medications before you take them.  Follow-up with your primary care doctor in 1 to 2 weeks or sooner if needed.   Take care,   Increase activity slowly   Complete by: As directed       Allergies as of 07/04/2023       Reactions   Codeine Nausea Only   Sick to her stomach   Erythromycin Nausea And Vomiting, Other (See Comments)   Tramadol Nausea Only, Other (See Comments)   Loss of appetite and did not eat   Ciprofloxacin Diarrhea   Upsets stomach        Medication List     TAKE these medications    acetaminophen 500 MG tablet Commonly known as: TYLENOL Take 2 tablets (1,000 mg total) by mouth every 8 (eight) hours for 5 days, THEN 2 tablets (1,000 mg total) every 8 (eight) hours as needed for up to 5 days for mild pain (pain score  1-3). Start taking on: July 03, 2023 What changed: See the new instructions.   aspirin EC 81 MG tablet Take 1 tablet (81 mg total) by mouth daily. Swallow whole.   carvedilol 3.125 MG tablet Commonly known as: COREG TAKE ONE TABLET BY MOUTH ONCE DAILY   clopidogrel 75 MG tablet Commonly known as: PLAVIX Take 1 tablet (75 mg total) by mouth daily.   CRANBERRY PO Take 1 tablet by mouth daily.   cyanocobalamin 1000 MCG tablet Take 1 tablet (1,000 mcg total) by mouth daily. Start taking on: July 05, 2023   ezetimibe 10 MG tablet Commonly known as: ZETIA TAKE ONE TABLET BY MOUTH ONCE DAILY   ferrous sulfate 325 (65 FE) MG tablet Take 1 tablet (325 mg total)  by mouth 2 (two) times daily with a meal.   glipiZIDE 5 MG 24 hr tablet Commonly known as: GLUCOTROL XL Take 1 tablet (5 mg total) by mouth daily with breakfast.   lisinopril 10 MG tablet Commonly known as: ZESTRIL TAKE ONE TABLET BY MOUTH ONCE DAILY   metFORMIN 500 MG 24 hr tablet Commonly known as: GLUCOPHAGE-XR Take 1 tablet (500 mg total) by mouth daily.   multivitamin with minerals tablet Take 1 tablet by mouth daily.   nitroGLYCERIN 0.4 MG SL tablet Commonly known as: NITROSTAT Place 1 tablet (0.4 mg total) under the tongue every 5 (five) minutes as needed for chest pain.   OneTouch Delica Lancets 33G Misc USE TO TEST TWICE A DAY   OneTouch Verio test strip Generic drug: glucose blood Use as instructed to monitor glucose twice daily   oxyCODONE 5 MG immediate release tablet Commonly known as: Roxicodone Take 1 tablet (5 mg total) by mouth every 8 (eight) hours as needed for up to 5 days.   pantoprazole 40 MG tablet Commonly known as: PROTONIX TAKE ONE TABLET BY MOUTH ONCE DAILY   rosuvastatin 40 MG tablet Commonly known as: CRESTOR TAKE ONE TABLET BY MOUTH ONCE DAILY   senna-docusate 8.6-50 MG tablet Commonly known as: Senokot-S Take 2 tablets by mouth 2 (two) times daily between meals as  needed for mild constipation.               Durable Medical Equipment  (From admission, onward)           Start     Ordered   07/03/23 1116  For home use only DME Walker rolling  Once       Question Answer Comment  Walker: With 5 Inch Wheels   Patient needs a walker to treat with the following condition Fx      07/03/23 1115   07/03/23 1116  For home use only DME Other see comment  Once       Comments: Shower chair  Question:  Length of Need  Answer:  6 Months   07/03/23 1115   07/03/23 0828  For home use only DME Walker rolling  Once       Question Answer Comment  Walker: With 5 Inch Wheels   Patient needs a walker to treat with the following condition Closed left femoral fracture (HCC)      07/03/23 0827            Consultations: Orthopedic surgery  Procedures/Studies: 10/14-ORIF of left femoral fracture    DG FEMUR PORT MIN 2 VIEWS LEFT  Result Date: 07/01/2023 CLINICAL DATA:  Postop. EXAM: LEFT FEMUR PORTABLE 2 VIEWS COMPARISON:  Preoperative imaging. FINDINGS: Lateral plate and multi screw fixation of distal femur fracture. Improved fracture alignment from preoperative imaging. Recent postsurgical change includes air and edema in the soft tissues. Prior knee arthroplasty again seen. IMPRESSION: ORIF of distal femur fracture. No immediate postoperative complication. Electronically Signed   By: Narda Rutherford M.D.   On: 07/01/2023 09:30   DG FEMUR MIN 2 VIEWS LEFT  Result Date: 07/01/2023 CLINICAL DATA:  Elective surgery. EXAM: LEFT FEMUR 2 VIEWS COMPARISON:  Preoperative imaging FINDINGS: Seven fluoroscopic spot views of the left femur obtained in the operating room. Lateral plate and multi screw fixation of distal femur fracture. Fluoroscopy time 28.4 seconds. Dose 1.01 mGy. IMPRESSION: Intraoperative fluoroscopy during ORIF of distal femur fracture. Electronically Signed   By: Narda Rutherford M.D.   On: 07/01/2023 09:29  DG C-Arm 1-60 Min-No  Report  Result Date: 07/01/2023 Fluoroscopy was utilized by the requesting physician.  No radiographic interpretation.   CT KNEE LEFT WO CONTRAST  Result Date: 06/30/2023 CLINICAL DATA:  Distal left femur periprosthetic fracture. Surgical planning. EXAM: CT OF THE LEFT KNEE WITHOUT CONTRAST TECHNIQUE: Multidetector CT imaging of the left knee was performed according to the standard protocol. Multiplanar CT image reconstructions were also generated. RADIATION DOSE REDUCTION: This exam was performed according to the departmental dose-optimization program which includes automated exposure control, adjustment of the mA and/or kV according to patient size and/or use of iterative reconstruction technique. COMPARISON:  Left femur x-rays from same day. Left knee x-rays from yesterday. FINDINGS: Bones/Joint/Cartilage Acute comminuted fracture of the distal femoral metadiaphysis again noted with up to 1.4 cm lateral displacement, 4 mm posterior displacement, and 1.2 cm impaction. Prior total knee arthroplasty. No evidence of hardware failure or loosening. Small hemarthrosis. Osteopenia. Ligaments Ligaments are suboptimally evaluated by CT. Muscles and Tendons Grossly intact. Soft tissue No fluid collection or hematoma.  No soft tissue mass. IMPRESSION: 1. Acute comminuted displaced and impacted periprosthetic fracture of the distal femoral metadiaphysis. Electronically Signed   By: Obie Dredge M.D.   On: 06/30/2023 15:07   DG FEMUR MIN 2 VIEWS LEFT  Result Date: 06/30/2023 CLINICAL DATA:  Periprosthetic fracture knee EXAM: LEFT FEMUR 2 VIEWS COMPARISON:  CT 06/11/2019 FINDINGS: Pelvic enthesopathy. Early right hip marginal spurring. Comminuted fracture distal left femoral metadiaphyseal region above the femoral component of knee arthroplasty. Distal fracture fragments displaced laterally up to 1.5 cm. IMPRESSION: Comminuted, displaced distal left femoral metadiaphyseal fracture. Electronically Signed   By: Corlis Leak M.D.   On: 06/30/2023 13:47   Chest Portable 1 View  Result Date: 06/30/2023 CLINICAL DATA:  Periprosthetic knee fracture EXAM: PORTABLE CHEST - 1 VIEW COMPARISON:  12/20/2021 FINDINGS: Coarse perihilar interstitial prominence, increased since previous. Patchy bibasilar interstitial and alveolar opacities left greater than right, probably emphasized by lower lung volumes. Heart size and mediastinal contours are within normal limits. Aortic Atherosclerosis (ICD10-170.0). Post CABG. Blunting of left lateral costophrenic angle suggesting small effusion. Sternotomy wires. IMPRESSION: 1. Low lung volumes with perihilar and bibasilar opacities suggesting edema and/or infiltrate. 2. Small left effusion. Electronically Signed   By: Corlis Leak M.D.   On: 06/30/2023 13:46   DG Knee 2 Views Left  Result Date: 06/29/2023 CLINICAL DATA:  Deformity of knee after tripped going down stairs. Left knee pain EXAM: LEFT KNEE - 1-2 VIEW COMPARISON:  Radiographs 01/06/2020 FINDINGS: Right TKA. Comminuted periprosthetic fracture of the distal left femur. There is apex anterior angulation. Fracture lines extend to the femoral component of the left TKA. Large knee joint effusion. IMPRESSION: Comminuted periprosthetic fracture of the distal left femur. Electronically Signed   By: Minerva Fester M.D.   On: 06/29/2023 17:06       The results of significant diagnostics from this hospitalization (including imaging, microbiology, ancillary and laboratory) are listed below for reference.     Microbiology: No results found for this or any previous visit (from the past 240 hour(s)).   Labs:  CBC: Recent Labs  Lab 06/29/23 1555 07/01/23 0454 07/02/23 0834 07/03/23 0642 07/04/23 0722  WBC 11.0* 7.0 5.3 4.8 4.9  HGB 11.5* 8.8* 7.3* 8.5* 8.1*  HCT 35.2* 26.7* 22.7* 24.9* 23.9*  MCV 91.7 91.1 90.1 87.7 86.6  PLT 149* 132* 109* 107* 117*   BMP &GFR Recent Labs  Lab 06/29/23 1555 07/01/23 0454  07/02/23  4098 07/03/23 0642 07/04/23 0722  NA 136 134* 135 135 135  K 4.6 4.0 4.2 4.2 4.5  CL 104 103 102 103 105  CO2 23 23 26 27 24   GLUCOSE 244* 138* 171* 195* 192*  BUN 32* 29* 24* 29* 24*  CREATININE 0.96 1.12* 1.15* 1.19* 1.14*  CALCIUM 10.3 9.4 9.6 9.4 9.1  MG  --   --   --  2.0 1.8  PHOS  --   --   --   --  1.9*   Estimated Creatinine Clearance: 30.4 mL/min (A) (by C-G formula based on SCr of 1.14 mg/dL (H)). Liver & Pancreas: Recent Labs  Lab 06/29/23 1555 07/02/23 0834 07/03/23 0642 07/04/23 0722  AST 37 22 47*  --   ALT 30 13 23   --   ALKPHOS 51 41 46  --   BILITOT 0.9 0.6 0.7  --   PROT 7.3 5.3* 5.4*  --   ALBUMIN 4.3 2.9* 2.7* 2.7*   No results for input(s): "LIPASE", "AMYLASE" in the last 168 hours. No results for input(s): "AMMONIA" in the last 168 hours. Diabetic: No results for input(s): "HGBA1C" in the last 72 hours. Recent Labs  Lab 07/03/23 0715 07/03/23 1129 07/03/23 1619 07/03/23 2008 07/04/23 0715  GLUCAP 165* 183* 235* 255* 179*   Cardiac Enzymes: No results for input(s): "CKTOTAL", "CKMB", "CKMBINDEX", "TROPONINI" in the last 168 hours. No results for input(s): "PROBNP" in the last 8760 hours. Coagulation Profile: Recent Labs  Lab 06/29/23 1555  INR 1.1   Thyroid Function Tests: No results for input(s): "TSH", "T4TOTAL", "FREET4", "T3FREE", "THYROIDAB" in the last 72 hours. Lipid Profile: No results for input(s): "CHOL", "HDL", "LDLCALC", "TRIG", "CHOLHDL", "LDLDIRECT" in the last 72 hours. Anemia Panel: Recent Labs    07/03/23 0642 07/03/23 0843  VITAMINB12 253  --   FOLATE  --  18.9  FERRITIN  --  77  TIBC  --  312  IRON  --  28  RETICCTPCT 2.2  --    Urine analysis:    Component Value Date/Time   COLORURINE YELLOW 08/16/2021 1138   APPEARANCEUR CLEAR 08/16/2021 1138   APPEARANCEUR Cloudy (A) 06/12/2017 1004   LABSPEC 1.020 08/16/2021 1138   LABSPEC 1.021 03/09/2013 1516   PHURINE 5.5 08/16/2021 1138   GLUCOSEU  NEGATIVE 08/16/2021 1138   HGBUR NEGATIVE 08/16/2021 1138   BILIRUBINUR neg 02/22/2023 1539   BILIRUBINUR Negative 06/12/2017 1004   BILIRUBINUR Negative 03/09/2013 1516   KETONESUR TRACE (A) 08/16/2021 1138   KETONESUR trace (5) (A) 08/16/2021 1125   PROTEINUR Negative 02/22/2023 1539   PROTEINUR 30 (A) 12/29/2019 1338   UROBILINOGEN 0.2 02/22/2023 1539   UROBILINOGEN 1.0 08/16/2021 1138   NITRITE neg 02/22/2023 1539   NITRITE NEGATIVE 08/16/2021 1138   LEUKOCYTESUR Small (1+) (A) 02/22/2023 1539   LEUKOCYTESUR MODERATE (A) 08/16/2021 1138   LEUKOCYTESUR Negative 03/09/2013 1516   Sepsis Labs: Invalid input(s): "PROCALCITONIN", "LACTICIDVEN"   SIGNED:  Almon Hercules, MD  [facility] 07/04/2023, 2:43 PM

## 2023-07-05 ENCOUNTER — Other Ambulatory Visit: Payer: Self-pay | Admitting: Family Medicine

## 2023-07-05 ENCOUNTER — Encounter: Payer: Self-pay | Admitting: Family Medicine

## 2023-07-05 ENCOUNTER — Telehealth: Payer: Self-pay

## 2023-07-05 ENCOUNTER — Telehealth: Payer: Self-pay | Admitting: *Deleted

## 2023-07-05 DIAGNOSIS — M9712XA Periprosthetic fracture around internal prosthetic left knee joint, initial encounter: Secondary | ICD-10-CM

## 2023-07-05 DIAGNOSIS — I739 Peripheral vascular disease, unspecified: Secondary | ICD-10-CM

## 2023-07-05 DIAGNOSIS — I10 Essential (primary) hypertension: Secondary | ICD-10-CM

## 2023-07-05 LAB — TYPE AND SCREEN
ABO/RH(D): A POS
Antibody Screen: NEGATIVE
Unit division: 0
Unit division: 0

## 2023-07-05 LAB — BPAM RBC
Blood Product Expiration Date: 202410202359
Blood Product Expiration Date: 202411142359
ISSUE DATE / TIME: 202410151621
Unit Type and Rh: 6200
Unit Type and Rh: 6200

## 2023-07-05 MED ORDER — PANTOPRAZOLE SODIUM 40 MG PO TBEC: DELAYED_RELEASE_TABLET | ORAL | 1 refills | Status: DC

## 2023-07-05 NOTE — Transitions of Care (Post Inpatient/ED Visit) (Signed)
07/05/2023  Name: Tracey [name] MRN: [id number] DOB: 11/16/1935  Today's TOC FU Call Status: Today's TOC FU Call Status:: Successful TOC FU Call Completed TOC FU Call Complete Date: 07/05/23 Patient's Name and Date of Birth confirmed.  Transition Care Management Follow-up Telephone Call Date of Discharge: 07/04/23 Discharge Facility: [facility]) Type of Discharge: Inpatient Admission Primary Inpatient Discharge Diagnosis:: Periprosthetic fracture around prosthetic knee How have you been since you were released from the hospital?: Same Any questions or concerns?: Yes Patient Questions/Concerns:: Wants to know when Home Health services with start and would like a SN to come Patient Questions/Concerns Addressed: Notified Provider of Patient Questions/Concerns  Items Reviewed: Did you receive and understand the discharge instructions provided?: Yes Medications obtained,verified, and reconciled?: Yes (Medications Reviewed) Any new allergies since your discharge?: No Dietary orders reviewed?: NA Do you have support at home?: Yes People in Home: grandchild(ren), spouse, sibling(s) Name of Support/Comfort Primary Source: [name]  Medications Reviewed Today: Medications Reviewed Today     Reviewed by [facility], RN (Case Manager) on 07/05/23 at 1159  Med List Status: <None>   Medication Order Taking? Sig Documenting Provider Last Dose Status Informant  acetaminophen (TYLENOL) 500 MG tablet [id number]  Take 2 tablets (1,000 mg total) by mouth every 8 (eight) hours for 5 days, THEN 2 tablets (1,000 mg total) every 8 (eight) hours as needed for up to 5 days for mild pain (pain score 1-3). Almon Hercules, MD  Active   aspirin EC 81 MG tablet [id number]  Take 1 tablet (81 mg total) by mouth daily. Swallow whole. Almon Hercules, MD  Active   carvedilol (COREG) 3.125 MG tablet [id number] No TAKE ONE TABLET BY MOUTH ONCE DAILY Antonieta Iba, MD 06/28/2023 1800 Active Self   clopidogrel (PLAVIX) 75 MG tablet [id number] No Take 1 tablet (75 mg total) by mouth daily. Antonieta Iba, MD 06/29/2023 0900 Active Self  CRANBERRY PO [id number] No Take 1 tablet by mouth daily. [provider] 06/29/2023 Active Self  cyanocobalamin 1000 MCG tablet [id number]  Take 1 tablet (1,000 mcg total) by mouth daily. Almon Hercules, MD  Active   ezetimibe (ZETIA) 10 MG tablet [id number] No TAKE ONE TABLET BY MOUTH ONCE DAILY Antonieta Iba, MD 06/28/2023 1800 Active Self  ferrous sulfate 325 (65 FE) MG tablet [id number]  Take 1 tablet (325 mg total) by mouth 2 (two) times daily with a meal. [name], [name], MD  Active   glipiZIDE (GLUCOTROL XL) 5 MG 24 hr tablet [id number] No Take 1 tablet (5 mg total) by mouth daily with breakfast. [name], NP 06/29/2023 0900 Active Self  glucose blood (ONETOUCH VERIO) test strip [id number] No Use as instructed to monitor glucose twice daily [name], NP Taking Active Self  lisinopril (ZESTRIL) 10 MG tablet [id number] No TAKE ONE TABLET BY MOUTH ONCE DAILY Antonieta Iba, MD 06/28/2023 1800 Active Self  metFORMIN (GLUCOPHAGE-XR) 500 MG 24 hr tablet [id number] No Take 1 tablet (500 mg total) by mouth daily. [name], NP 06/29/2023 0900 Active Self  Multiple Vitamins-Minerals (MULTIVITAMIN WITH MINERALS) tablet [id number] No Take 1 tablet by mouth daily. [provider] 06/28/2023 1200 Active Self  nitroGLYCERIN (NITROSTAT) 0.4 MG SL tablet [id number] No Place 1 tablet (0.4 mg total) under the tongue every 5 (five) minutes as needed for chest pain. Antonieta Iba, MD prn Active Self  ondansetron (ZOFRAN-ODT) 4 MG disintegrating tablet [id number]  Take 1-2 tablets by mouth every  8 hrs as needed for nausea Excell Seltzer, MD  Active   OneTouch Delica Lancets 33G MISC [id number] No USE TO TEST TWICE A DAY [name], [name], MD Taking Active Self  oxyCODONE (ROXICODONE) 5 MG immediate release tablet [id number]  Take  1 tablet (5 mg total) by mouth every 8 (eight) hours as needed for up to 5 days. Almon Hercules, MD  Active   pantoprazole (PROTONIX) 40 MG tablet [id number] No TAKE ONE TABLET BY MOUTH ONCE DAILY Tower, Audrie Gallus, MD 06/29/2023 0900 Active Self  rosuvastatin (CRESTOR) 40 MG tablet [id number] No TAKE ONE TABLET BY MOUTH ONCE DAILY Antonieta Iba, MD 06/28/2023 1800 Active Self  senna-docusate (SENOKOT-S) 8.6-50 MG tablet [id number]  Take 2 tablets by mouth 2 (two) times daily between meals as needed for mild constipation. Almon Hercules, MD  Active             Home Care and Equipment/Supplies: Were Home Health Services Ordered?: Yes Name of Home Health Agency:: [phone] Has Agency set up a time to come to your home?: No EMR reviewed for Home Health Orders: Orders present/patient has not received call (refer to CM for follow-up) Any new equipment or medical supplies ordered?: Yes Name of Medical supply agency?: [employer] Were you able to get the equipment/medical supplies?: Yes Do you have any questions related to the use of the equipment/supplies?: No  Functional Questionnaire: Do you need assistance with bathing/showering or dressing?: Yes Do you need assistance with meal preparation?: Yes Do you need assistance with eating?: No Do you have difficulty maintaining continence: No Do you need assistance with getting out of bed/getting out of a chair/moving?: Yes Do you have difficulty managing or taking your medications?: Yes  Follow up appointments reviewed: PCP Follow-up appointment confirmed?: Yes Date of PCP follow-up appointment?: 07/08/23 Follow-up Provider: [facility] Follow-up appointment confirmed?: No Reason Specialist Follow-Up Not Confirmed: Patient has Specialist Provider Number and will Call for Appointment Do you need transportation to your follow-up appointment?: No Do you understand care options if your condition(s) worsen?: Yes-patient verbalized  understanding  SDOH Interventions Today    Flowsheet Row Most Recent Value  SDOH Interventions   Food Insecurity Interventions Intervention Not Indicated  Transportation Interventions Intervention Not Indicated      The patient's granddaughter Angelica Chessman, the patient's spouse and the patient's sisters are involved in her care. The family is requesting a SN in addition to HHPT/OT. Provider notified. 30 day enrollment is not needed at this time   Deidre Ala, RN RN Care Manager [facility] [phone]

## 2023-07-05 NOTE — Telephone Encounter (Signed)
[name] followed up with me, she said that the nurse manager at the hospital f/u with then they have ordered PT and OT for her. They did recommend that PCP order Nursing eval since they didn't order that. [facility] are using [facility] if PCP wants to put an order in to that office.

## 2023-07-05 NOTE — Telephone Encounter (Signed)
Angelica Chessman has scheduled a virtual hospital f/u appt with pt for Monday, Angelica Chessman is asking if PCP can put a new order in for PT/ OT eval since pt was d/c home yesterday. The hospital notes she was d/c with it but we don't see an order in the chart. [name] asked if PCP would re-placed the order.  [name]'s # [phone]

## 2023-07-05 NOTE — Telephone Encounter (Signed)
The referral is in   Will see her as planned

## 2023-07-07 NOTE — Plan of Care (Signed)
[facility] Tonsillectomy/Adenoidectomy, Postoperative PEDS care plan entered in error.

## 2023-07-08 ENCOUNTER — Telehealth: Payer: Self-pay | Admitting: *Deleted

## 2023-07-08 ENCOUNTER — Telehealth (INDEPENDENT_AMBULATORY_CARE_PROVIDER_SITE_OTHER): Payer: Medicare HMO | Admitting: Family Medicine

## 2023-07-08 ENCOUNTER — Encounter: Payer: Self-pay | Admitting: Family Medicine

## 2023-07-08 DIAGNOSIS — D500 Iron deficiency anemia secondary to blood loss (chronic): Secondary | ICD-10-CM | POA: Diagnosis not present

## 2023-07-08 DIAGNOSIS — N289 Disorder of kidney and ureter, unspecified: Secondary | ICD-10-CM

## 2023-07-08 DIAGNOSIS — M9712XA Periprosthetic fracture around internal prosthetic left knee joint, initial encounter: Secondary | ICD-10-CM | POA: Diagnosis not present

## 2023-07-08 DIAGNOSIS — K5900 Constipation, unspecified: Secondary | ICD-10-CM | POA: Insufficient documentation

## 2023-07-08 DIAGNOSIS — W108XXA Fall (on) (from) other stairs and steps, initial encounter: Secondary | ICD-10-CM

## 2023-07-08 DIAGNOSIS — K5903 Drug induced constipation: Secondary | ICD-10-CM

## 2023-07-08 DIAGNOSIS — D509 Iron deficiency anemia, unspecified: Secondary | ICD-10-CM | POA: Insufficient documentation

## 2023-07-08 NOTE — Assessment & Plan Note (Signed)
Doing well s/p injury and ORIF  Reviewed hospital records, lab results and studies in detail   Instructed pt to call the orthopedic doctor regarding follow up, pain medication, bruising and dvt prophylaxis along with [facility] for PT/OT  I ordered [facility] for nursing to check wound and draw blood for cbc and bmet   Using walker No complaints

## 2023-07-08 NOTE — Progress Notes (Signed)
Virtual Visit via Video Note  I connected with Tracey [name] on 07/08/23 at 12:30 PM EDT by a video enabled telemedicine application and verified that I am speaking with the correct person using two identifiers.  Location: Patient: home Provider: office    I discussed the limitations of evaluation and management by telemedicine and the availability of in person appointments. The patient expressed understanding and agreed to proceed.  Parties involved in encounter  Patient: Tracey [name]   Provider:  Roxy Manns MD   History of Present Illness: Pt presents for follow up from hospitalization for periprosthetic knee fracture She had surgery on this (ORIF distal femur)  Hosp from 10/13 to 10/17 after falling down steps  Xray noted comminuted periprosthetic fracture of distal left femur  Surgery 10/14 with Dr Jena Gauss Noted post osteoporosis anemia  Patient discharged home with home health PT/OT as well as rolling walker and shower chair per recommendation by therapy. Outpatient follow-up with orthopedic surgery in 2 weeks.   Noted she still needs home nursing referral for [facility]  Would like to have blood drawn at home   Hospital course as follows  Periprosthetic left distal femur fracture secondary to fall: Did not strike head or LOC.  Imaging on admission revealed left periprosthetic distal femur fracture.  She has a history of left TKA in 12/2019.  -S/p ORIF by Dr. Jena Gauss on 10/14.  -Scheduled Tylenol with as needed oxycodone for pain control -Aspirin in addition to home Plavix for DVT prophylaxis per Dr. Jena Gauss.  -HH PT/OT, rolling walker and shower chair -Outpatient follow-up with orthopedic surgery in 2 weeks   Postoperative blood loss anemia: Hgb dropped from 11.5 to 7.3 but improved to 8.5.  Operative EBL only 150.  No obvious bleeding.  Some element of hemodilution.  Anemia panel with some iron deficiency and borderline B12. Recent Labs (within last 365 days)[] Expand by  Default          Recent Labs    11/13/22 1139 06/29/23 1555 07/01/23 0454 07/02/23 0834 07/03/23 0642 07/04/23 0722  HGB 12.2 11.5* 8.8* 7.3* 8.5* 8.1*    -Discharge on p.o. ferrous sulfate and vitamin B12 -Bowel regimen   Uncontrolled NIDDM-2 with hyperglycemia: A1c 8.1%. -Continue home meds   Pancytopenia-ruled out.   History of SVT-appears to be in sinus rhythm.   CAD s/p CABG in 1995, PAD, HLD -Continue home meds.   Essential hypertension-Per OT, orthostatic vitals positive but not symptomatic.   -On Coreg and lisinopril.    CKD-3A: Stable   Thrombocytopenia: Improving.      Will need cbc Lab Results  Component Value Date   WBC 4.9 07/04/2023   HGB 8.1 (L) 07/04/2023   HCT 23.9 (L) 07/04/2023   MCV 86.6 07/04/2023   PLT 117 (L) 07/04/2023   Lab Results  Component Value Date   IRON 28 07/03/2023   TIBC 312 07/03/2023   FERRITIN 77 07/03/2023   She is tolerating iron  Come constipation     Lab Results  Component Value Date   NA 135 07/04/2023   K 4.5 07/04/2023   CO2 24 07/04/2023   GLUCOSE 192 (H) 07/04/2023   BUN 24 (H) 07/04/2023   CREATININE 1.14 (H) 07/04/2023   CALCIUM 9.1 07/04/2023   GFR 49.45 (L) 11/13/2022   GFRNONAA 47 (L) 07/04/2023     Pain control-oxycodone from ortho  Keeping it elevated Tolerating pain  Walking with walker as much as she can  Has not received a call  Unsure when she sees orthopedic  Dr Jena Gauss did her surgery   Right now let is bruised  Not as bad as prior   Patient Active Problem List   Diagnosis Date Noted   Anemia, iron deficiency 07/08/2023   Constipation 07/08/2023   Periprosthetic fracture around prosthetic knee 06/30/2023   Fall down steps 06/30/2023   Leukocytosis 06/30/2023   History of cardiac dysrhythmia 06/30/2023   Leukocytes in urine 03/15/2022   Kyphoscoliosis 11/24/2021   Low back pain 11/21/2021   Estrogen deficiency 11/01/2021   Arthritis of carpometacarpal [facility]) joint of  left thumb 05/19/2021   Muscle cramps 05/15/2021   Distal radius fracture, left 05/05/2021   Dysuria 04/14/2021   Hypercalcemia 03/01/2020   General weakness 02/05/2020   Total knee replacement status 01/06/2020   Rectocele 09/27/2019   Vaginal atrophy 09/27/2019   Renal artery stenosis (HCC) 06/30/2019   Varicose veins of both lower extremities with inflammation 05/27/2019   Essential hypertension, benign 05/07/2019   Coronary artery disease of native artery of native heart with stable angina pectoris (HCC) 04/01/2019   Syncope 12/25/2018   Palpitations 12/25/2018   PAD (peripheral artery disease) (HCC) 12/25/2018   Stenosis of left carotid artery 12/25/2018   Primary osteoarthritis of left knee 04/27/2018   Weight loss 01/13/2018   PAF (paroxysmal atrial fibrillation) (HCC) 11/11/2017   Routine general medical examination at a health care facility 11/11/2017   Renal insufficiency 11/11/2017   Osteoporosis 04/30/2017   Vitamin D deficiency 02/03/2017   Vitamin B12 deficiency 11/05/2016   Change in bowel habits 03/07/2015   FH: colon cancer 03/07/2015   GERD (gastroesophageal reflux disease) 03/07/2015   Osteoarthritis 03/15/2014   Status post aorto-coronary artery bypass graft 01/22/2012   Hyperlipidemia associated with type 2 diabetes mellitus (HCC) 01/22/2012   Type 2 diabetes mellitus with stage 2 chronic kidney disease, without long-term current use of insulin (HCC) 01/22/2012   Atherosclerosis of renal artery (HCC) 01/22/2012   Past Medical History:  Diagnosis Date   Anxiety    Arthritis    Atrial fibrillation (HCC)    history   Chronic kidney disease    ckd stage II   Coronary artery disease    Diabetes mellitus    non insulin dependent   Dysrhythmia    atrial fibrillation, history of   GERD (gastroesophageal reflux disease)    Heart murmur    History of kidney stones    Hyperlipidemia    Hypertension    Peripheral vascular disease (HCC)    Rectocele     Syncope 09/2019   UTI (urinary tract infection)    Past Surgical History:  Procedure Laterality Date   ABDOMINAL AORTA STENT     APPENDECTOMY     BREAST BIOPSY Left    20 years ago   BREAST BIOPSY Right 2008   BREAST EXCISIONAL BIOPSY     rt 2008 lt "20 years ago"   CARDIAC CATHETERIZATION     CATARACT EXTRACTION W/PHACO Left 08/15/2015   Procedure: CATARACT EXTRACTION PHACO AND INTRAOCULAR LENS PLACEMENT (IOC);  Surgeon: Sallee Lange, MD;  Location: [facility] ORS;  Service: Ophthalmology;  Laterality: Left;  [place] 01:28 AP% 26.4 CDE 35.73 fluid pack # [id number]   COLONOSCOPY WITH PROPOFOL N/A 01/16/2016   Procedure: COLONOSCOPY WITH PROPOFOL;  Surgeon: Scot Jun, MD;  Location: [facility] ENDOSCOPY;  Service: Endoscopy;  Laterality: N/A;   CORONARY ANGIOPLASTY WITH STENT PLACEMENT     CORONARY ARTERY BYPASS GRAFT  1995   with LIMA  to the LAD, SVG to OM1, SVG to PDA   ESOPHAGOGASTRODUODENOSCOPY (EGD) WITH PROPOFOL N/A 01/16/2016   Procedure: ESOPHAGOGASTRODUODENOSCOPY (EGD) WITH PROPOFOL;  Surgeon: Scot Jun, MD;  Location: [facility] ENDOSCOPY;  Service: Endoscopy;  Laterality: N/A;   EYE SURGERY     JOINT REPLACEMENT Right    KNEE ARTHROPLASTY Left 01/06/2020   Procedure: COMPUTER ASSISTED TOTAL KNEE ARTHROPLASTY;  Surgeon: Donato Heinz, MD;  Location: [facility] ORS;  Service: Orthopedics;  Laterality: Left;   LOWER EXTREMITY ANGIOGRAPHY N/A 07/08/2019   Procedure: Abdominal Aortagraphy;  Surgeon: Annice Needy, MD;  Location: [facility] INVASIVE CV LAB;  Service: Cardiovascular;  Laterality: N/A;   ORIF FEMUR FRACTURE Left 07/01/2023   Procedure: OPEN REDUCTION INTERNAL FIXATION (ORIF) DISTAL FEMUR FRACTURE;  Surgeon: Roby Lofts, MD;  Location: [facility] OR;  Service: Orthopedics;  Laterality: Left;   RENAL ANGIOGRAPHY Left 08/26/2019   Procedure: RENAL ANGIOGRAPHY;  Surgeon: Annice Needy, MD;  Location: [facility] INVASIVE CV LAB;  Service: Cardiovascular;  Laterality: Left;   RENAL ARTERY STENT      left, By Dr Earnestine Leys   REPLACEMENT TOTAL KNEE  03/25/2013   right   TONSILLECTOMY     Social History   Tobacco Use   Smoking status: Never   Smokeless tobacco: Never  Vaping Use   Vaping status: Never Used  Substance Use Topics   Alcohol use: No   Drug use: No   Family History  Problem Relation Age of Onset   Heart disease Father    Other Brother        open heart surgery   Cancer Brother    Other Brother        open heart surgery   Other Brother        open heart surgery   Cancer Daughter    Hyperlipidemia Daughter    Breast cancer Neg Hx    Kidney cancer Neg Hx    Bladder Cancer Neg Hx    Allergies  Allergen Reactions   Codeine Nausea Only    Sick to her stomach   Erythromycin Nausea And Vomiting and Other (See Comments)   Tramadol Nausea Only and Other (See Comments)    Loss of appetite and did not eat   Ciprofloxacin Diarrhea    Upsets stomach   Current Outpatient Medications on File Prior to Visit  Medication Sig Dispense Refill   acetaminophen (TYLENOL) 500 MG tablet Take 2 tablets (1,000 mg total) by mouth every 8 (eight) hours for 5 days, THEN 2 tablets (1,000 mg total) every 8 (eight) hours as needed for up to 5 days for mild pain (pain score 1-3). 30 tablet 0   aspirin EC 81 MG tablet Take 1 tablet (81 mg total) by mouth daily. Swallow whole.     carvedilol (COREG) 3.125 MG tablet TAKE ONE TABLET BY MOUTH ONCE DAILY 90 tablet 3   clopidogrel (PLAVIX) 75 MG tablet Take 1 tablet (75 mg total) by mouth daily. 90 tablet 3   CRANBERRY PO Take 1 tablet by mouth daily.     cyanocobalamin 1000 MCG tablet Take 1 tablet (1,000 mcg total) by mouth daily.     ezetimibe (ZETIA) 10 MG tablet TAKE ONE TABLET BY MOUTH ONCE DAILY 90 tablet 3   ferrous sulfate 325 (65 FE) MG tablet Take 1 tablet (325 mg total) by mouth 2 (two) times daily with a meal.     glipiZIDE (GLUCOTROL XL) 5 MG 24 hr tablet Take 1 tablet (5  mg total) by mouth daily with breakfast. 90 tablet 1   glucose  blood (ONETOUCH VERIO) test strip Use as instructed to monitor glucose twice daily 100 each 3   lisinopril (ZESTRIL) 10 MG tablet TAKE ONE TABLET BY MOUTH ONCE DAILY 90 tablet 3   metFORMIN (GLUCOPHAGE-XR) 500 MG 24 hr tablet Take 1 tablet (500 mg total) by mouth daily. 90 tablet 3   Multiple Vitamins-Minerals (MULTIVITAMIN WITH MINERALS) tablet Take 1 tablet by mouth daily.     nitroGLYCERIN (NITROSTAT) 0.4 MG SL tablet Place 1 tablet (0.4 mg total) under the tongue every 5 (five) minutes as needed for chest pain. 25 tablet 3   ondansetron (ZOFRAN-ODT) 4 MG disintegrating tablet Take 1-2 tablets by mouth every 8 hrs as needed for nausea 30 tablet 0   OneTouch Delica Lancets 33G MISC USE TO TEST TWICE A DAY 100 each 1   oxyCODONE (ROXICODONE) 5 MG immediate release tablet Take 1 tablet (5 mg total) by mouth every 8 (eight) hours as needed for up to 5 days. 15 tablet 0   pantoprazole (PROTONIX) 40 MG tablet Take one capsule every other day 45 tablet 1   rosuvastatin (CRESTOR) 40 MG tablet TAKE ONE TABLET BY MOUTH ONCE DAILY 90 tablet 3   senna-docusate (SENOKOT-S) 8.6-50 MG tablet Take 2 tablets by mouth 2 (two) times daily between meals as needed for mild constipation.     No current facility-administered medications on file prior to visit.   Review of Systems  Constitutional:  Positive for malaise/fatigue. Negative for chills and fever.  HENT:  Negative for congestion, ear pain, sinus pain and sore throat.   Eyes:  Negative for blurred vision, discharge and redness.  Respiratory:  Negative for cough, shortness of breath and stridor.   Cardiovascular:  Negative for chest pain, palpitations and leg swelling.  Gastrointestinal:  Positive for constipation. Negative for abdominal pain, diarrhea, nausea and vomiting.  Musculoskeletal:  Positive for joint pain. Negative for myalgias.       Surgical/ femur pain- getting better   Skin:  Negative for rash.  Neurological:  Negative for dizziness and  headaches.       Observations/Objective: Patient appears well, in no distress Sitting in recliner  Weight is baseline  No facial swelling or asymmetry Normal voice-not hoarse and no slurred speech No obvious tremor or mobility impairment Moving neck and UEs normally Able to hear the call well  No cough or shortness of breath during interview  Talkative and mentally sharp with no cognitive changes No skin changes on face or neck , no rash or pallor Affect is normal    Assessment and Plan: Problem List Items Addressed This Visit       Musculoskeletal and Integument   Periprosthetic fracture around prosthetic knee - Primary    Doing well s/p injury and ORIF  Reviewed hospital records, lab results and studies in detail   Instructed pt to call the orthopedic doctor regarding follow up, pain medication, bruising and dvt prophylaxis along with [facility] for PT/OT  I ordered [facility] for nursing to check wound and draw blood for cbc and bmet   Using walker No complaints      Relevant Orders   Ambulatory referral to Home Health     Genitourinary   Renal insufficiency    GFR 47 at hosp discharge [facility] nursing ordered to draw this       Relevant Orders   Ambulatory referral to Home Health     Other  Anemia, iron deficiency    S/p ORIF for femur fracture Hb 8.1 Iron 28  Was put on ferrous sulfate bid   HH ordered/nursing to draw blood since she is not mobile enough to come in yet       Relevant Orders   Ambulatory referral to Home Health   Constipation    From iron  Taking senna plus Cautioned /careful with stimulant laxatives  Discussed use of miralax and stool softeners Keep up fluids         Fall down steps    Long discussion re: fall prevention This resulted in distal femur fracture   Plans to stop going up stairs entirely  PT and OT pending-HH        Follow Up Instructions: Call the ortho office of Dr Jena Gauss Make a follow up appointment Ask about  bruising and DVT prophylaxis (you are on aspirin and plavix) Ask about pain medication  Ask about the home health order for PT and OT  I placed a home health order for nursing to check your wound and draw blood for cbc and bmet when able   Follow up here for a visit when you are more mobile  Please no longer go up your stairs  Work with the therapists re: fall prevention   Try miralax and a stool softener for constipation from the iron    I discussed the assessment and treatment plan with the patient. The patient was provided an opportunity to ask questions and all were answered. The patient agreed with the plan and demonstrated an understanding of the instructions.   The patient was advised to call back or seek an in-person evaluation if the symptoms worsen or if the condition fails to improve as anticipated.     Roxy Manns, MD

## 2023-07-08 NOTE — Telephone Encounter (Addendum)
Tracey [name] with [facility] home [facility] called. They didn't realize that pt lives in [place] and they do not service [place] at all they don't have reps that go that far. She said we will need to place new orders for, PT, OT, and Nursing Eval going to another agency. Spoke with Ashtyn and she will call [facility] back and tell them to f/u back up with family.

## 2023-07-08 NOTE — Telephone Encounter (Signed)
[name], pt's granddaughter faxed FMLA forms for [place] to fill out, placed in your inbox

## 2023-07-08 NOTE — Assessment & Plan Note (Signed)
Long discussion re: fall prevention This resulted in distal femur fracture   Plans to stop going up stairs entirely  PT and OT pending-HH

## 2023-07-08 NOTE — Telephone Encounter (Signed)
If at all possible, please go through some questions for her - is this for a block of time or intermittent?  For which health problem of Tracey [name], etc  Thanks

## 2023-07-08 NOTE — Assessment & Plan Note (Signed)
GFR 47 at hosp discharge [facility] nursing ordered to draw this

## 2023-07-08 NOTE — Patient Instructions (Signed)
Call the ortho office of Dr Jena Gauss Make a follow up appointment Ask about bruising and DVT prophylaxis (you are on aspirin and plavix) Ask about pain medication  Ask about the home health order for PT and OT  I placed a home health order for nursing to check your wound and draw blood for cbc and bmet when able   Follow up here for a visit when you are more mobile  Please no longer go up your stairs  Work with the therapists re: fall prevention   Try miralax and a stool softener for constipation from the iron

## 2023-07-08 NOTE — Assessment & Plan Note (Signed)
From iron  Taking senna plus Cautioned /careful with stimulant laxatives  Discussed use of miralax and stool softeners Keep up fluids

## 2023-07-08 NOTE — Assessment & Plan Note (Signed)
S/p ORIF for femur fracture Hb 8.1 Iron 28  Was put on ferrous sulfate bid   HH ordered/nursing to draw blood since she is not mobile enough to come in yet

## 2023-07-09 ENCOUNTER — Encounter: Payer: Self-pay | Admitting: Family Medicine

## 2023-07-09 NOTE — Telephone Encounter (Signed)
Tracey [name] a message asking her. Will f/u once she responds

## 2023-07-10 NOTE — Telephone Encounter (Signed)
Great, thanks for the update

## 2023-07-10 NOTE — Telephone Encounter (Signed)
Please call and check in  I responded to this my chart message last night with a separate message  Angelica Chessman is a good contact  She was texting me this am and they decided to try some benadryl because rash was worse  We discussed possibly going to the ER   Just want to see how she is doing

## 2023-07-10 NOTE — Telephone Encounter (Signed)
Spoke with Angelica Chessman and ppw is no longer needed at this time.

## 2023-07-10 NOTE — Telephone Encounter (Signed)
Spoke with Angelica Chessman to get update on patient. She stated that patient is doing better. Swelling in upper lip has gone down and rash is starting to subside with the benadryl.

## 2023-07-12 ENCOUNTER — Telehealth: Payer: Self-pay | Admitting: *Deleted

## 2023-07-12 NOTE — Telephone Encounter (Signed)
Tracey [name] has spoken directly with PCP and issues have been addressed

## 2023-07-12 NOTE — Telephone Encounter (Signed)
Sent message back to [facility] with PCP's comments will await a response

## 2023-07-12 NOTE — Telephone Encounter (Signed)
[name] send me a message saying:  Couple of things:  Update for [place]:   rash is controlled as long as she takes benadryl 50mg  bid,  the 25's don't touch it.  been giving her this since Wednesday.  I don't like her on that much benadryl but the zyrtec we tried doesn't touch it.  Rash/itching return as soon as time for next dose.  welts/hives  now to forehead and scalp, but nmo more around lips or swelling.     4.  Still trying to find out culprit.  I called [facility], all he said was hold her 325 ASA for 2 days.  today i started her back on 68 ASA because she was doing that prior without difficulty.   5.  I decided today to start holding her IRON as this was the only other NEW thing she started.  she has had issues with iron in past.   I am hoping that is the culprit. Just hoping she doesn't have issues with her anemia.  6.  She now has a pressure ulcert to her sacrum.  I am treating with desitin and she has a donut pillow to relieve pressure when sitting.  7.  Need home health nurse asap, i have been in contact with them they keep giving me the run around.   apparently at the earliest now she will be out is Monday or Tuesday.  We still don't have updated labs on her.     Despite being miserable, she is doing well with her leg.  Just weak, but vitals and blood sugars are looking good.  we keep her fed and with lots of fluids.  Just trying to manage rash and pressure ulcer now are the challenges.  I hope the rash goes away soon.  She is on a lot of Benadryl and that is not good long term.     sorry, i am done now.  I can be reached by my cell if needed, i am going out to make a home visit with a patient.  but should be available if needed.  thanks so much for the help.

## 2023-07-12 NOTE — Telephone Encounter (Signed)
I am so sorry I am out of the office and am going to be away from computer most of the day Only option for rash if she cannot physically come in (if we have anyone available to see her) is ER and I hate that for her  Prednisone would be option if she was not diabetic and did not just have surgery- but feel like that would be risky (they can check with her ortho)  Thanks for the heads up  Would you please check with referrals about the home nurse situation ?   I don't know if anything can be done about that   Again- so sorry  Continue the desitin and donut pillow

## 2023-07-15 DIAGNOSIS — D649 Anemia, unspecified: Secondary | ICD-10-CM | POA: Diagnosis not present

## 2023-07-22 ENCOUNTER — Telehealth: Payer: Self-pay | Admitting: *Deleted

## 2023-07-22 DIAGNOSIS — S72452D Displaced supracondylar fracture without intracondylar extension of lower end of left femur, subsequent encounter for closed fracture with routine healing: Secondary | ICD-10-CM | POA: Diagnosis not present

## 2023-07-22 NOTE — Progress Notes (Unsigned)
Date:  07/23/2023   ID:  Warren Lacy, DOB 02/22/1936, MRN [id number]  Patient Location:  [address] [place] [id number]   Provider location:   [facility], [place] office  PCP:  [name], MD  Cardiologist:  Hubbard [facility]  Chief Complaint  Patient presents with   6 month follow up     Patient c/o fluttering in chest at times. Patient slipped on some steps 3 weeks ago and had a periprosthetic fracture of distal left femur. Medications reviewed by the patient verbally.     History of Present Illness:    Tracey [name] is a 87 y.o. female  past medical history of coronary artery disease, bypass surgery in 1995,  diabetes  stent to her graft to the OM several years ago with chest pain January 2013  repeat stenting to the graft to the OM with a Xience 3.5 x 18 mm stent  delivery of a shock for  resuscitation during the procedure),  left renal PCI in 1998, redo PCI March 2010 followed by Dr. Wyn Quaker,  History of near syncope spells who presents for routine followup of her coronary artery disease   She  lost her husband in 2016  Last seen by myself in clinic March 2024 Presents today with daughter and husband  On prior clinic visit reported chest pain symptoms Myoview April 2024 with no ischemia normal ejection fraction  Recent hospital records reviewed from October 2024 Fenmur fx, surgery, anemia, hypotension Transfusion Low iron Started OTC iron, had reaction, felt it was like a anaphylaxis with lip swelling Pressure sacral ulcer from her hospital course getting better  Still moving with a walker, gait instability, slow to transfer Denies significant chest pain concerning for angina 8 pound weight gain since her last clinic visit  Lab work reviewed A1C 8.1  Rare palpitations lasting less than a minute  Echo in 2020: normal EF  EKG personally reviewed by myself on todays visit EKG Interpretation Date/Time:  Tuesday July 23 2023  08:43:24 EST Ventricular Rate:  67 PR Interval:  198 QRS Duration:  94 QT Interval:  374 QTC Calculation: 395 R Axis:   -2  Text Interpretation: Normal sinus rhythm Low voltage QRS Cannot rule out Anterior infarct , age undetermined When compared with ECG of 30-Jun-2023 09:06, Questionable change in QRS axis Confirmed by Julien Nordmann [phone]) on 07/23/2023 8:56:46 AM   Other past medical history reviewed Prior history of near syncope/syncope episodes Several falls 1 episode reports she was in her Backyard, had a "quick dizzy spell", fell face down  Another episode, 6 Am, went to the bathroom, was walking back to bed, had a "flash spell", fell, broke hand  Monday, "flash" spell, felt very dizzy Denies vertigo  Followed by Dr. Wyn Quaker 08/25/2020, vascular procedure reviewed and discussed with her in detail Atherosclerotic occlusive disease bilateral lower extremities with rest pain and lifestyle limiting claudication; recurrent left renal artery stenosis; renovascular hypertension  Percutaneous transluminal angioplasty and stent placement pararenal aorta   Percutaneous transluminal angioplasty and stent placement left renal artery  Carotid Carotid artery ultrasound showed 1-39% RICA stenosis and 40-59% LICA stenosis.   Echo: Normal EF 65% RVSP 32 Mild to moderate MR   Past Medical History:  Diagnosis Date   Anxiety    Arthritis    Atrial fibrillation (HCC)    history   Chronic kidney disease    ckd stage II   Coronary artery disease  Diabetes mellitus    non insulin dependent   Dysrhythmia    atrial fibrillation, history of   GERD (gastroesophageal reflux disease)    Heart murmur    History of kidney stones    Hyperlipidemia    Hypertension    Peripheral vascular disease (HCC)    Rectocele    Syncope 09/2019   UTI (urinary tract infection)    Past Surgical History:  Procedure Laterality Date   ABDOMINAL AORTA STENT     APPENDECTOMY     BREAST BIOPSY Left     20 years ago   BREAST BIOPSY Right 2008   BREAST EXCISIONAL BIOPSY     rt 2008 lt "20 years ago"   CARDIAC CATHETERIZATION     CATARACT EXTRACTION W/PHACO Left 08/15/2015   Procedure: CATARACT EXTRACTION PHACO AND INTRAOCULAR LENS PLACEMENT (IOC);  Surgeon: Sallee Lange, MD;  Location: [facility] ORS;  Service: Ophthalmology;  Laterality: Left;  [place] 01:28 AP% 26.4 CDE 35.73 fluid pack # [id number]   COLONOSCOPY WITH PROPOFOL N/A 01/16/2016   Procedure: COLONOSCOPY WITH PROPOFOL;  Surgeon: Scot Jun, MD;  Location: [facility] ENDOSCOPY;  Service: Endoscopy;  Laterality: N/A;   CORONARY ANGIOPLASTY WITH STENT PLACEMENT     CORONARY ARTERY BYPASS GRAFT  1995   with LIMA  to the LAD, SVG to OM1, SVG to PDA   ESOPHAGOGASTRODUODENOSCOPY (EGD) WITH PROPOFOL N/A 01/16/2016   Procedure: ESOPHAGOGASTRODUODENOSCOPY (EGD) WITH PROPOFOL;  Surgeon: Scot Jun, MD;  Location: [facility] ENDOSCOPY;  Service: Endoscopy;  Laterality: N/A;   EYE SURGERY     JOINT REPLACEMENT Right    KNEE ARTHROPLASTY Left 01/06/2020   Procedure: COMPUTER ASSISTED TOTAL KNEE ARTHROPLASTY;  Surgeon: Donato Heinz, MD;  Location: [facility] ORS;  Service: Orthopedics;  Laterality: Left;   LOWER EXTREMITY ANGIOGRAPHY N/A 07/08/2019   Procedure: Abdominal Aortagraphy;  Surgeon: Annice Needy, MD;  Location: [facility] INVASIVE CV LAB;  Service: Cardiovascular;  Laterality: N/A;   ORIF FEMUR FRACTURE Left 07/01/2023   Procedure: OPEN REDUCTION INTERNAL FIXATION (ORIF) DISTAL FEMUR FRACTURE;  Surgeon: Roby Lofts, MD;  Location: [facility] OR;  Service: Orthopedics;  Laterality: Left;   RENAL ANGIOGRAPHY Left 08/26/2019   Procedure: RENAL ANGIOGRAPHY;  Surgeon: Annice Needy, MD;  Location: [facility] INVASIVE CV LAB;  Service: Cardiovascular;  Laterality: Left;   RENAL ARTERY STENT     left, By Dr Earnestine Leys   REPLACEMENT TOTAL KNEE  03/25/2013   right   TONSILLECTOMY       Current Meds  Medication Sig   aspirin EC 81 MG tablet Take 1 tablet (81 mg total) by  mouth daily. Swallow whole.   carvedilol (COREG) 3.125 MG tablet TAKE ONE TABLET BY MOUTH ONCE DAILY   clopidogrel (PLAVIX) 75 MG tablet Take 1 tablet (75 mg total) by mouth daily.   CRANBERRY PO Take 1 tablet by mouth daily.   cyanocobalamin 1000 MCG tablet Take 1 tablet (1,000 mcg total) by mouth daily.   ezetimibe (ZETIA) 10 MG tablet TAKE ONE TABLET BY MOUTH ONCE DAILY   glipiZIDE (GLUCOTROL XL) 5 MG 24 hr tablet Take 1 tablet (5 mg total) by mouth daily with breakfast.   glucose blood (ONETOUCH VERIO) test strip Use as instructed to monitor glucose twice daily   lisinopril (ZESTRIL) 10 MG tablet TAKE ONE TABLET BY MOUTH ONCE DAILY   metFORMIN (GLUCOPHAGE-XR) 500 MG 24 hr tablet Take 1 tablet (500 mg total) by mouth daily.   Multiple Vitamins-Minerals (MULTIVITAMIN WITH MINERALS) tablet Take  1 tablet by mouth daily.   nitroGLYCERIN (NITROSTAT) 0.4 MG SL tablet Place 1 tablet (0.4 mg total) under the tongue every 5 (five) minutes as needed for chest pain.   OneTouch Delica Lancets 33G MISC USE TO TEST TWICE A DAY   pantoprazole (PROTONIX) 40 MG tablet Take one capsule every other day   rosuvastatin (CRESTOR) 40 MG tablet TAKE ONE TABLET BY MOUTH ONCE DAILY   senna-docusate (SENOKOT-S) 8.6-50 MG tablet Take 2 tablets by mouth 2 (two) times daily between meals as needed for mild constipation.     Allergies:   Ferrous sulfate, Codeine, Erythromycin, Tramadol, and Ciprofloxacin   Family Hx: The patient's family history includes Cancer in her brother and daughter; Heart disease in her father; Hyperlipidemia in her daughter; Other in her brother, brother, and brother. There is no history of Breast cancer, Kidney cancer, or Bladder Cancer.  ROS:   Please see the history of present illness.    Review of Systems  Constitutional: Negative.   HENT: Negative.    Respiratory: Negative.    Cardiovascular: Negative.   Gastrointestinal: Negative.   Musculoskeletal: Negative.   Neurological:  Negative.   Psychiatric/Behavioral: Negative.    All other systems reviewed and are negative.    Labs/Other Tests and Data Reviewed:    Recent Labs: 11/13/2022: TSH 1.78 07/03/2023: ALT 23 07/04/2023: BUN 24; Creatinine, Ser 1.14; Hemoglobin 8.1; Magnesium 1.8; Platelets 117; Potassium 4.5; Sodium 135   Recent Lipid Panel Lab Results  Component Value Date/Time   CHOL 125 11/13/2022 11:39 AM   CHOL 158 12/12/2020 11:15 AM   CHOL 141 10/08/2011 04:57 AM   TRIG 104.0 11/13/2022 11:39 AM   TRIG 179 10/08/2011 04:57 AM   HDL 49.20 11/13/2022 11:39 AM   HDL 52 12/12/2020 11:15 AM   HDL 37 (L) 10/08/2011 04:57 AM   CHOLHDL 3 11/13/2022 11:39 AM   LDLCALC 55 11/13/2022 11:39 AM   LDLCALC 70 04/24/2021 10:43 AM   LDLCALC 68 10/08/2011 04:57 AM    Wt Readings from Last 3 Encounters:  07/23/23 123 lb 2 oz (55.8 kg)  07/01/23 128 lb (58.1 kg)  06/29/23 130 lb (59 kg)     Exam:    BP (!) 140/58 (BP Location: Left Arm, Patient Position: Sitting, Cuff Size: Normal)   Pulse 67   Ht 5\' 3"  (1.6 m)   Wt 123 lb 2 oz (55.8 kg)   SpO2 98%   BMI 21.81 kg/m  Constitutional:  oriented to person, place, and time. No distress.  HENT:  Head: Grossly normal Eyes:  no discharge. No scleral icterus.  Neck: No JVD, no carotid bruits  Cardiovascular: Regular rate and rhythm, no murmurs appreciated Pulmonary/Chest: Clear to auscultation bilaterally, no wheezes or rails Abdominal: Soft.  no distension.  no tenderness.  Musculoskeletal: Normal range of motion Neurological:  normal muscle tone. Coordination normal. No atrophy Skin: Skin warm and dry Psychiatric: normal affect, pleasant  ASSESSMENT & PLAN:    Coronary artery disease with stable angina, history of CABG Currently with no symptoms of angina. No further workup at this time. Continue current medication regimen. Myoview with no ischemia Reports orthopedics put her on high-dose aspirin and continued Plavix  Supraventricular  tachycardia Rare short episodes tachycardia Continue low-dose carvedilol  Sinus bradycardia Rate in the 60s, normal EKG  Near syncope, syncope, unspecified syncope type Prior history of near syncope, etiology unclear, ZIO monitor performed previously Triggered events not associated with significant arrhythmia Recommend she stay hydrated, no medication  changes made, no recent symptoms  PAD (peripheral artery disease) (HCC) Significant aortic disease, renal artery disease Prior intervention by Dr. Wyn Quaker,  Continue aspirin Plavix, statin, cholesterol at goal We will discuss need for Plavix with vascular  Hyperlipidemia Continue Crestor Zetia, cholesterol at goal  Diabetes type 2 with complications Working with primary care and endocrine, A1c greater than 8    Signed, Julien Nordmann, MD  07/23/2023 8:56 AM    [facility] [address], [place], [place] [address]

## 2023-07-22 NOTE — Telephone Encounter (Signed)
Tracey [name] asked if we received labs from home health. I advised her I don't see labs in chart yet. She said she will have them refax labs to [place] today. Will be on the look out.   Tracey [name] also wanted me to send a message to PCP she said:  "I had to take her off the iron. That was causing her rash/ reaction. We need to add to her med list that she has anaphylactic type reaction as lips had started swelling and some angiogram started before we got it under control."   ** Iron removed from med list and added to allergy list (FYI)

## 2023-07-23 ENCOUNTER — Encounter: Payer: Self-pay | Admitting: Cardiovascular Disease

## 2023-07-23 ENCOUNTER — Ambulatory Visit: Payer: Medicare HMO | Attending: Cardiovascular Disease | Admitting: Cardiovascular Disease

## 2023-07-23 VITALS — BP 140/58 | HR 67 | Ht 63.0 in | Wt 123.1 lb

## 2023-07-23 DIAGNOSIS — R55 Syncope and collapse: Secondary | ICD-10-CM

## 2023-07-23 DIAGNOSIS — I700 Atherosclerosis of aorta: Secondary | ICD-10-CM | POA: Diagnosis not present

## 2023-07-23 DIAGNOSIS — I6522 Occlusion and stenosis of left carotid artery: Secondary | ICD-10-CM

## 2023-07-23 DIAGNOSIS — Z79899 Other long term (current) drug therapy: Secondary | ICD-10-CM | POA: Diagnosis not present

## 2023-07-23 DIAGNOSIS — I10 Essential (primary) hypertension: Secondary | ICD-10-CM | POA: Diagnosis not present

## 2023-07-23 DIAGNOSIS — I701 Atherosclerosis of renal artery: Secondary | ICD-10-CM

## 2023-07-23 DIAGNOSIS — I739 Peripheral vascular disease, unspecified: Secondary | ICD-10-CM | POA: Diagnosis not present

## 2023-07-23 DIAGNOSIS — I471 Supraventricular tachycardia, unspecified: Secondary | ICD-10-CM

## 2023-07-23 DIAGNOSIS — I25118 Atherosclerotic heart disease of native coronary artery with other forms of angina pectoris: Secondary | ICD-10-CM

## 2023-07-23 DIAGNOSIS — E0800 Diabetes mellitus due to underlying condition with hyperosmolarity without nonketotic hyperglycemic-hyperosmolar coma (NKHHC): Secondary | ICD-10-CM

## 2023-07-23 LAB — CBC
Hematocrit: 36.6 % (ref 34.0–46.6)
Hemoglobin: 11.4 g/dL (ref 11.1–15.9)
MCH: 29.3 pg (ref 26.6–33.0)
MCHC: 31.1 g/dL — ABNORMAL LOW (ref 31.5–35.7)
MCV: 94 fL (ref 79–97)
Platelets: 276 10*3/uL (ref 150–450)
RBC: 3.89 x10E6/uL (ref 3.77–5.28)
RDW: 12.9 % (ref 11.7–15.4)
WBC: 6.3 10*3/uL (ref 3.4–10.8)

## 2023-07-23 NOTE — Patient Instructions (Addendum)
Medication Instructions:  No changes  If you need a refill on your cardiac medications before your next appointment, please call your pharmacy.   Lab work: CBC today  Testing/Procedures: No new testing needed  Follow-Up: At [facility], you and your health needs are our priority.  As part of our continuing mission to provide you with exceptional heart care, we have created designated Provider Care Teams.  These Care Teams include your primary Cardiologist (physician) and Advanced Practice Providers (APPs -  Physician Assistants and Nurse Practitioners) who all work together to provide you with the care you need, when you need it.  You will need a follow up appointment in 6 months  Providers on your designated Care Team:   Nicolasa Ducking, NP Eula Listen, PA-C Cadence Fransico Michael, [place]  COVID-19 Vaccine Information can be found at: [email or web address] For questions related to vaccine distribution or appointments, please email [email or web address] or call 8305953063.

## 2023-07-24 NOTE — Telephone Encounter (Signed)
I still have not seen anything...

## 2023-07-26 ENCOUNTER — Telehealth (INDEPENDENT_AMBULATORY_CARE_PROVIDER_SITE_OTHER): Payer: Self-pay

## 2023-07-26 NOTE — Telephone Encounter (Signed)
Patient granddaughter left a message stating that her grandmother had complex leg fracture and surgical procedure a few weeks ago. The patient Asprin was increased from 81 mg to 325 mg and taking Clopidogrel 75 mg. The cardiologist recommended for the patient to hold the Plavix for 1 month but advise her to contact our office for vascular stand point. Please Advise

## 2023-07-26 NOTE — Telephone Encounter (Signed)
Patient granddaughter was notified with medical recomnedations

## 2023-07-29 ENCOUNTER — Encounter: Payer: Self-pay | Admitting: Emergency Medicine

## 2023-08-07 ENCOUNTER — Other Ambulatory Visit: Payer: Self-pay | Admitting: Nurse Practitioner

## 2023-08-07 ENCOUNTER — Other Ambulatory Visit: Payer: Self-pay | Admitting: Cardiovascular Disease

## 2023-08-13 ENCOUNTER — Ambulatory Visit: Payer: Medicare HMO | Attending: Cardiovascular Disease

## 2023-08-13 DIAGNOSIS — I6523 Occlusion and stenosis of bilateral carotid arteries: Secondary | ICD-10-CM | POA: Diagnosis not present

## 2023-08-13 DIAGNOSIS — S72452D Displaced supracondylar fracture without intracondylar extension of lower end of left femur, subsequent encounter for closed fracture with routine healing: Secondary | ICD-10-CM | POA: Diagnosis not present

## 2023-08-23 ENCOUNTER — Ambulatory Visit (INDEPENDENT_AMBULATORY_CARE_PROVIDER_SITE_OTHER): Payer: Medicare HMO

## 2023-08-23 VITALS — Ht 62.5 in | Wt 123.0 lb

## 2023-08-23 DIAGNOSIS — Z0000 Encounter for general adult medical examination without abnormal findings: Secondary | ICD-10-CM | POA: Diagnosis not present

## 2023-08-23 NOTE — Patient Instructions (Signed)
Ms. Kaska , Thank you for taking time to come for your Medicare Wellness Visit. I appreciate your ongoing commitment to your health goals. Please review the following plan we discussed and let me know if I can assist you in the future.   Referrals/Orders/Follow-Ups/Clinician Recommendations: none  This is a list of the screening recommended for you and due dates:  Health Maintenance  Topic Date Due   COVID-19 Vaccine (4 - 2023-24 season) 05/19/2023   Zoster (Shingles) Vaccine (1 of 2) 10/08/2023*   Flu Shot  12/16/2023*   DTaP/Tdap/Td vaccine (2 - Tdap) 07/07/2024*   Hemoglobin A1C  10/02/2023   Complete foot exam   11/14/2023   Yearly kidney health urinalysis for diabetes  03/31/2024   Eye exam for diabetics  05/21/2024   Yearly kidney function blood test for diabetes  07/03/2024   Medicare Annual Wellness Visit  08/22/2024   Pneumonia Vaccine  Completed   DEXA scan (bone density measurement)  Completed   HPV Vaccine  Aged Out  *Topic was postponed. The date shown is not the original due date.    Advanced directives: (Copy Requested) Please bring a copy of your health care power of attorney and living will to the office to be added to your chart at your convenience.  Next Medicare Annual Wellness Visit scheduled for next year: Yes 08/25/24 @ 1:40pm telephone

## 2023-08-23 NOTE — Progress Notes (Signed)
Subjective:   Tracey [name] is a 87 y.o. female who presents for Medicare Annual (Subsequent) preventive examination.  Visit Complete: Virtual I connected with  Tracey [name] on 08/23/23 by a audio enabled telemedicine application and verified that I am speaking with the correct person using two identifiers.  Patient Location: Home  Provider Location: Office/Clinic  I discussed the limitations of evaluation and management by telemedicine. The patient expressed understanding and agreed to proceed.  Vital Signs: Because this visit was a virtual/telehealth visit, some criteria may be missing or patient reported. Any vitals not documented were not able to be obtained and vitals that have been documented are patient reported.  Patient Medicare AWV questionnaire was completed by the patient on (not done); I have confirmed that all information answered by patient is correct and no changes since this date. Cardiac Risk Factors include: advanced age (>69men, >30 women);diabetes mellitus;dyslipidemia;hypertension    Objective:    Today's Vitals   08/23/23 1347  Weight: 123 lb (55.8 kg)  Height: 5' 2.5" (1.588 m)   Body mass index is 22.14 kg/m.     08/23/2023    2:03 PM 07/01/2023    6:52 AM 06/30/2023   10:00 AM 06/29/2023    3:53 PM 09/05/2022    1:38 PM 08/24/2021    2:03 PM 01/06/2020    6:00 PM  Advanced Directives  Does Patient Have a Medical Advance Directive? Yes Yes No No Yes Yes Yes  Type of Estate agent of [place];Living will Living will   Healthcare Power of [place];Living will Healthcare Power of [place];Living will Healthcare Power of Attorney  Does patient want to make changes to medical advance directive?     No - Patient declined Yes (MAU/Ambulatory/Procedural Areas - Information given) Yes (Inpatient - patient defers changing a medical advance directive and declines information at this time)  Copy of Healthcare Power of Attorney in Chart?  No - copy requested    No - copy requested  No - copy requested  Would patient like information on creating a medical advance directive?   No - Patient declined    Yes (MAU/Ambulatory/Procedural Areas - Information given)    Current Medications (verified) Outpatient Encounter Medications as of 08/23/2023  Medication Sig   carvedilol (COREG) 3.125 MG tablet TAKE ONE TABLET BY MOUTH ONCE DAILY   clopidogrel (PLAVIX) 75 MG tablet TAKE ONE TABLET BY MOUTH ONCE DAILY   CRANBERRY PO Take 1 tablet by mouth daily.   cyanocobalamin 1000 MCG tablet Take 1 tablet (1,000 mcg total) by mouth daily.   ezetimibe (ZETIA) 10 MG tablet TAKE ONE TABLET BY MOUTH ONCE DAILY   glipiZIDE (GLUCOTROL XL) 5 MG 24 hr tablet TAKE ONE TABLET BY MOUTH EVERY DAY WITH WITH BREAKFAST   glucose blood (ONETOUCH VERIO) test strip Use as instructed to monitor glucose twice daily   lisinopril (ZESTRIL) 10 MG tablet TAKE ONE TABLET BY MOUTH ONCE DAILY   metFORMIN (GLUCOPHAGE-XR) 500 MG 24 hr tablet Take 1 tablet (500 mg total) by mouth daily.   Multiple Vitamins-Minerals (MULTIVITAMIN WITH MINERALS) tablet Take 1 tablet by mouth daily.   nitroGLYCERIN (NITROSTAT) 0.4 MG SL tablet Place 1 tablet (0.4 mg total) under the tongue every 5 (five) minutes as needed for chest pain.   OneTouch Delica Lancets 33G MISC USE TO TEST TWICE A DAY   pantoprazole (PROTONIX) 40 MG tablet Take one capsule every other day   rosuvastatin (CRESTOR) 40 MG tablet TAKE ONE TABLET BY  MOUTH ONCE DAILY   senna-docusate (SENOKOT-S) 8.6-50 MG tablet Take 2 tablets by mouth 2 (two) times daily between meals as needed for mild constipation.   ondansetron (ZOFRAN-ODT) 4 MG disintegrating tablet Take 1-2 tablets by mouth every 8 hrs as needed for nausea (Patient not taking: Reported on 08/23/2023)   No facility-administered encounter medications on file as of 08/23/2023.    Allergies (verified) Ferrous sulfate, Codeine, Erythromycin, Tramadol, and Ciprofloxacin    History: Past Medical History:  Diagnosis Date   Anxiety    Arthritis    Atrial fibrillation (HCC)    history   Chronic kidney disease    ckd stage II   Coronary artery disease    Diabetes mellitus    non insulin dependent   Dysrhythmia    atrial fibrillation, history of   GERD (gastroesophageal reflux disease)    Heart murmur    History of kidney stones    Hyperlipidemia    Hypertension    Peripheral vascular disease (HCC)    Rectocele    Syncope 09/2019   UTI (urinary tract infection)    Past Surgical History:  Procedure Laterality Date   ABDOMINAL AORTA STENT     APPENDECTOMY     BREAST BIOPSY Left    20 years ago   BREAST BIOPSY Right 2008   BREAST EXCISIONAL BIOPSY     rt 2008 lt "20 years ago"   CARDIAC CATHETERIZATION     CATARACT EXTRACTION W/PHACO Left 08/15/2015   Procedure: CATARACT EXTRACTION PHACO AND INTRAOCULAR LENS PLACEMENT (IOC);  Surgeon: Sallee Lange, MD;  Location: [facility] ORS;  Service: Ophthalmology;  Laterality: Left;  [place] 01:28 AP% 26.4 CDE 35.73 fluid pack # [id number]   COLONOSCOPY WITH PROPOFOL N/A 01/16/2016   Procedure: COLONOSCOPY WITH PROPOFOL;  Surgeon: Scot Jun, MD;  Location[facility] ENDOSCOPY;  Service: Endoscopy;  Laterality: N/A;   CORONARY ANGIOPLASTY WITH STENT PLACEMENT     CORONARY ARTERY BYPASS GRAFT  1995   with LIMA  to the LAD, SVG to OM1, SVG to PDA   ESOPHAGOGASTRODUODENOSCOPY (EGD) WITH PROPOFOL N/A 01/16/2016   Procedure: ESOPHAGOGASTRODUODENOSCOPY (EGD) WITH PROPOFOL;  Surgeon: Scot Jun, MD;  Location: [facility] ENDOSCOPY;  Service: Endoscopy;  Laterality: N/A;   EYE SURGERY     JOINT REPLACEMENT Right    KNEE ARTHROPLASTY Left 01/06/2020   Procedure: COMPUTER ASSISTED TOTAL KNEE ARTHROPLASTY;  Surgeon: Donato Heinz, MD;  Location: [facility] ORS;  Service: Orthopedics;  Laterality: Left;   LOWER EXTREMITY ANGIOGRAPHY N/A 07/08/2019   Procedure: Abdominal Aortagraphy;  Surgeon: Annice Needy, MD;  Location: [facility]  INVASIVE CV LAB;  Service: Cardiovascular;  Laterality: N/A;   ORIF FEMUR FRACTURE Left 07/01/2023   Procedure: OPEN REDUCTION INTERNAL FIXATION (ORIF) DISTAL FEMUR FRACTURE;  Surgeon: Roby Lofts, MD;  Location: [facility] OR;  Service: Orthopedics;  Laterality: Left;   RENAL ANGIOGRAPHY Left 08/26/2019   Procedure: RENAL ANGIOGRAPHY;  Surgeon: Annice Needy, MD;  Location: [facility] INVASIVE CV LAB;  Service: Cardiovascular;  Laterality: Left;   RENAL ARTERY STENT     left, By Dr Earnestine Leys   REPLACEMENT TOTAL KNEE  03/25/2013   right   TONSILLECTOMY     Family History  Problem Relation Age of Onset   Heart disease Father    Other Brother        open heart surgery   Cancer Brother    Other Brother        open heart surgery   Other Brother  open heart surgery   Cancer Daughter    Hyperlipidemia Daughter    Breast cancer Neg Hx    Kidney cancer Neg Hx    Bladder Cancer Neg Hx    Social History   Socioeconomic History   Marital status: Married    Spouse name: Not on file   Number of children: Not on file   Years of education: Not on file   Highest education level: Not on file  Occupational History   Occupation: school cafeteria    Comment: retired  Tobacco Use   Smoking status: Never   Smokeless tobacco: Never  Vaping Use   Vaping status: Never Used  Substance and Sexual Activity   Alcohol use: No   Drug use: No   Sexual activity: Never  Other Topics Concern   Not on file  Social History Narrative   Patient has 3 daughters. Remarried June 2021.   Social Determinants of Health   Financial Resource Strain: Low Risk  (08/23/2023)   Overall Financial Resource Strain (CARDIA)    Difficulty of Paying Living Expenses: Not hard at all  Food Insecurity: No Food Insecurity (08/23/2023)   Hunger Vital Sign    Worried About Running Out of Food in the Last Year: Never true    Ran Out of Food in the Last Year: Never true  Transportation Needs: No Transportation Needs (08/23/2023)    PRAPARE - Administrator, Civil Service (Medical): No    Lack of Transportation (Non-Medical): No  Physical Activity: Insufficiently Active (08/23/2023)   Exercise Vital Sign    Days of Exercise per Week: 4 days    Minutes of Exercise per Session: 30 min  Stress: No Stress Concern Present (08/23/2023)   [employer] of Occupational Health - Occupational Stress Questionnaire    Feeling of Stress : Only a little  Social Connections: Socially Integrated (08/23/2023)   Social Connection and Isolation Panel [NHANES]    Frequency of Communication with Friends and Family: More than three times a week    Frequency of Social Gatherings with Friends and Family: More than three times a week    Attends Religious Services: More than 4 times per year    Active Member of [employer] or Organizations: Yes    Attends Engineer, structural: More than 4 times per year    Marital Status: Married    Tobacco Counseling Counseling given: Not Answered   Clinical Intake:  Pre-visit preparation completed: No  Pain : No/denies pain    BMI - recorded: 22.14 Nutritional Status: BMI of 19-24  Normal Nutritional Risks: None Diabetes: Yes CBG done?: Yes (BS 139 this am at home) CBG resulted in Enter/ Edit results?: No Did pt. bring in CBG monitor from home?: No  How often do you need to have someone help you when you read instructions, pamphlets, or other written materials from your doctor or pharmacy?: 1 - Never  Interpreter Needed?: No  Comments: lives with husband Information entered by :: B.Annaleia Pence,LPN   Activities of Daily Living    08/23/2023    2:04 PM 06/30/2023   10:00 AM  In your present state of health, do you have any difficulty performing the following activities:  Hearing? 0 0  Vision? 0 0  Difficulty concentrating or making decisions? 0 0  Walking or climbing stairs? 0   Dressing or bathing? 0   Doing errands, shopping? 0   Preparing Food and eating ? N    Using the Toilet?  N   In the past six months, have you accidently leaked urine? N   Do you have problems with loss of bowel control? N   Managing your Medications? N   Managing your Finances? N   Housekeeping or managing your Housekeeping? N     Patient Care Team: Tower, Audrie Gallus, MD as PCP - General (Family Medicine) Antonieta Iba, MD as Consulting Physician (Cardiology) Dingeldein, Viviann Spare, MD as Consulting Physician (Ophthalmology) Hildred Laser, MD as Referring Physician (Obstetrics and Gynecology)  Indicate any recent Medical Services you may have received from other than [facility] providers in the past year (date may be approximate).     Assessment:   This is a routine wellness examination for Tracey [name].  Hearing/Vision screen Hearing Screening - Comments:: Pt says her hearing is good Vision Screening - Comments:: Pt says her vision is good with glasses only for readers Dr Dellie Burns   Goals Addressed             This Visit's Progress    COMPLETED: Follow up with Primary Care Provider   On track    Starting 11/06/2017, I will continue to take medications as prescribed and to keep appointments with PCP as scheduled.      Increase physical activity   Not on track    Starting 10/26/2016, I will continue to walk at least 30 min 3-4 days per week.      Patient Stated   Not on track    Would like to try and walk more.       Depression Screen    08/23/2023    2:00 PM 09/05/2022    1:35 PM 08/24/2021    2:07 PM 04/14/2021   12:39 PM 04/01/2019   12:03 PM 11/06/2017    9:59 AM 10/26/2016   10:20 AM  PHQ 2/9 Scores  PHQ - 2 Score 0 0 0 0 0 2 0  PHQ- 9 Score      10     Fall Risk    08/23/2023    1:52 PM 09/05/2022    1:40 PM 08/24/2021    2:05 PM 04/14/2021   12:39 PM 05/07/2019    1:59 PM  Fall Risk   Falls in the past year? 1 0 1 1 0  Number falls in past yr: 0 0 0 0   Injury with Fall? 1 0 0 0   Comment broke left femur      Risk for fall due to : No Fall Risks   Other (Comment)    Risk for fall due to: Comment   blacked out , seeing cardiologist    Follow up Education provided;Falls prevention discussed Falls evaluation completed;Falls prevention discussed Falls prevention discussed  Falls evaluation completed    MEDICARE RISK AT HOME: Medicare Risk at Home Any stairs in or around the home?: Yes If so, are there any without handrails?: Yes Home free of loose throw rugs in walkways, pet beds, electrical cords, etc?: Yes Adequate lighting in your home to reduce risk of falls?: Yes Life alert?: No Use of a cane, walker or w/c?: Yes Grab bars in the bathroom?: Yes Shower chair or bench in shower?: Yes Elevated toilet seat or a handicapped toilet?: Yes  TIMED UP AND GO:  Was the test performed?  No    Cognitive Function:    11/06/2017    9:58 AM 10/26/2016   10:21 AM  MMSE - Mini Mental State Exam  Orientation to time 5 5  Orientation to Place 5 5  Registration 3 3  Attention/ Calculation 0 0  Recall 3 3  Language- name 2 objects 0 0  Language- repeat 1 1  Language- follow 3 step command 3 3  Language- read & follow direction 0 0  Write a sentence 0 0  Copy design 0 0  Total score 20 20        08/23/2023    2:05 PM 09/05/2022    2:00 PM  6CIT Screen  What Year? 0 points 0 points  What month? 0 points 0 points  What time? 0 points 0 points  Count back from 20 0 points 0 points  Months in reverse 0 points 0 points  Repeat phrase 0 points 0 points  Total Score 0 points 0 points    Immunizations Immunization History  Administered Date(s) Administered   Fluad Quad(high Dose 65+) 06/15/2019, 09/26/2020   Influenza Split 08/10/2015   Influenza, High Dose Seasonal PF 11/05/2022   Influenza,inj,Quad PF,6+ Mos 05/24/2016, 07/26/2017, 06/03/2018, 07/11/2021   Influenza-Unspecified 08/10/2015, 05/24/2016   Moderna Sars-Covid-2 Vaccination 10/02/2019, 10/30/2019, 08/01/2020   Pneumococcal Conjugate-13 11/05/2016   Pneumococcal  Polysaccharide-23 12/11/2017   Td 03/17/2013    TDAP status: Up to date  Flu Vaccine status: Due, Education has been provided regarding the importance of this vaccine. Advised may receive this vaccine at local pharmacy or Health Dept. Aware to provide a copy of the vaccination record if obtained from local pharmacy or Health Dept. Verbalized acceptance and understanding.  Pneumococcal vaccine status: Up to date  Covid-19 vaccine status: Completed vaccines  Qualifies for Shingles Vaccine? Yes   Zostavax completed No   Shingrix Completed?: No.    Education has been provided regarding the importance of this vaccine. Patient has been advised to call insurance company to determine out of pocket expense if they have not yet received this vaccine. Advised may also receive vaccine at local pharmacy or Health Dept. Verbalized acceptance and understanding.  Screening Tests Health Maintenance  Topic Date Due   COVID-19 Vaccine (4 - 2023-24 season) 05/19/2023   Zoster Vaccines- Shingrix (1 of 2) 10/08/2023 (Originally 08/17/1955)   INFLUENZA VACCINE  12/16/2023 (Originally 04/18/2023)   DTaP/Tdap/Td (2 - Tdap) 07/07/2024 (Originally 03/18/2023)   HEMOGLOBIN A1C  10/02/2023   FOOT EXAM  11/14/2023   Diabetic kidney evaluation - Urine ACR  03/31/2024   OPHTHALMOLOGY EXAM  05/21/2024   Diabetic kidney evaluation - eGFR measurement  07/03/2024   Medicare Annual Wellness (AWV)  08/22/2024   Pneumonia Vaccine 93+ Years old  Completed   DEXA SCAN  Completed   HPV VACCINES  Aged Out    Health Maintenance  Health Maintenance Due  Topic Date Due   COVID-19 Vaccine (4 - 2023-24 season) 05/19/2023    Colorectal cancer screening: No longer required.   Mammogram status: No longer required due to age.  Bone Density status: Completed 12/28/2021. Results reflect: Bone density results: OSTEOPOROSIS. Repeat every 3 years.  Lung Cancer Screening: (Low Dose CT Chest recommended if Age 41-80 years, 20  pack-year currently smoking OR have quit w/in 15years.) does not qualify.   Lung Cancer Screening Referral: no  Additional Screening:  Hepatitis C Screening: does not qualify; Completed no  Vision Screening: Recommended annual ophthalmology exams for early detection of glaucoma and other disorders of the eye. Is the patient up to date with their annual eye exam?  Yes  Who is the provider or what is the name of the office in which  the patient attends annual eye exams? Dr Dellie Burns If pt is not established with a provider, would they like to be referred to a provider to establish care? No .   Dental Screening: Recommended annual dental exams for proper oral hygiene  Diabetic Foot Exam: Diabetic Foot Exam: Completed 11/01/22  Community Resource Referral / Chronic Care Management: CRR required this visit?  No   CCM required this visit?  No    Plan:     I have personally reviewed and noted the following in the patient's chart:   Medical and social history Use of alcohol, tobacco or illicit drugs  Current medications and supplements including opioid prescriptions. Patient is not currently taking opioid prescriptions. Functional ability and status Nutritional status Physical activity Advanced directives List of other physicians Hospitalizations, surgeries, and ER visits in previous 12 months Vitals Screenings to include cognitive, depression, and falls Referrals and appointments  In addition, I have reviewed and discussed with patient certain preventive protocols, quality metrics, and best practice recommendations. A written personalized care plan for preventive services as well as general preventive health recommendations were provided to patient.    Sue Lush, LPN   16/09/958   After Visit Summary: (MyChart) Due to this being a telephonic visit, the after visit summary with patients personalized plan was offered to patient via MyChart   Nurse Notes: pt in good  spirits as she relays she broke her femur in October and is still on the mend. She continues PT 3 days a week and she relays she does them everyday. She has no concerns or questions at this time.

## 2023-09-03 ENCOUNTER — Telehealth: Payer: Self-pay | Admitting: Cardiovascular Disease

## 2023-09-03 NOTE — Telephone Encounter (Signed)
Returned the call to the patient. She stated that this morning, while she was sitting, she almost blacked out. She stated that she did not actually black out but felt like she might.  She denied any chest pain or shortness of breath. She stated that she has been staying hydrated. She denies having feelings like this prior.   During the episode, her heart rate was 111. After things had clamed down her blood pressure was 158/63 and pulse was 65.  While on the phone her heart rate was 55 hr, oxygen was 98, and her blood pressure was 160/71  Current medications: Carvedilol 3.125 mg once daily Lisinopril 10 mg once daily

## 2023-09-03 NOTE — Telephone Encounter (Signed)
STAT if patient feels like he/she is going to faint   Are you dizzy now? No   Do you feel faint or have you passed out? Almost passed out around 9:15 A.M.   Do you have any other symptoms? No   Have you checked your HR and BP (record if available)? 111 HR, 88 O2, 158/63 BP and PR 65 at the time of feeling faint.        Current HR is 60

## 2023-09-04 ENCOUNTER — Ambulatory Visit: Payer: Medicare HMO | Attending: Cardiovascular Disease

## 2023-09-04 ENCOUNTER — Other Ambulatory Visit: Payer: Self-pay | Admitting: Emergency Medicine

## 2023-09-04 DIAGNOSIS — I480 Paroxysmal atrial fibrillation: Secondary | ICD-10-CM

## 2023-09-04 NOTE — Telephone Encounter (Signed)
Called patient and notified her of the following from Dr. Mariah Milling.   Sounds like she may have had arrhythmia when dizzy Hard to say what rhythm, ?afib , atrial tachycardia Would suggest a live zio monitor Thx TGollan       Patient verbalizes understanding. Orders placed for Zio monitor.

## 2023-09-06 ENCOUNTER — Other Ambulatory Visit: Payer: Self-pay | Admitting: Cardiovascular Disease

## 2023-09-06 DIAGNOSIS — I6523 Occlusion and stenosis of bilateral carotid arteries: Secondary | ICD-10-CM

## 2023-09-09 DIAGNOSIS — I480 Paroxysmal atrial fibrillation: Secondary | ICD-10-CM | POA: Diagnosis not present

## 2023-09-17 DIAGNOSIS — S72452D Displaced supracondylar fracture without intracondylar extension of lower end of left femur, subsequent encounter for closed fracture with routine healing: Secondary | ICD-10-CM | POA: Diagnosis not present

## 2023-09-26 ENCOUNTER — Other Ambulatory Visit: Payer: Self-pay | Admitting: Cardiovascular Disease

## 2023-09-27 DIAGNOSIS — I480 Paroxysmal atrial fibrillation: Secondary | ICD-10-CM | POA: Diagnosis not present

## 2023-10-04 ENCOUNTER — Encounter: Payer: Self-pay | Admitting: Cardiovascular Disease

## 2023-10-27 ENCOUNTER — Other Ambulatory Visit: Payer: Self-pay | Admitting: Family Medicine

## 2023-10-31 DIAGNOSIS — Z23 Encounter for immunization: Secondary | ICD-10-CM | POA: Diagnosis not present

## 2023-11-04 DIAGNOSIS — S72452D Displaced supracondylar fracture without intracondylar extension of lower end of left femur, subsequent encounter for closed fracture with routine healing: Secondary | ICD-10-CM | POA: Diagnosis not present

## 2023-11-10 ENCOUNTER — Telehealth: Payer: Self-pay | Admitting: Family Medicine

## 2023-11-10 DIAGNOSIS — E1169 Type 2 diabetes mellitus with other specified complication: Secondary | ICD-10-CM

## 2023-11-10 DIAGNOSIS — E559 Vitamin D deficiency, unspecified: Secondary | ICD-10-CM

## 2023-11-10 DIAGNOSIS — I10 Essential (primary) hypertension: Secondary | ICD-10-CM

## 2023-11-10 DIAGNOSIS — E538 Deficiency of other specified B group vitamins: Secondary | ICD-10-CM

## 2023-11-10 NOTE — Telephone Encounter (Signed)
-----   Message from Lovena Neighbours sent at 11/05/2023  8:55 AM EST ----- Regarding: Labs for Tuesday 2.25.25 Please put lab orders in future. Thank you, Denny Peon

## 2023-11-12 ENCOUNTER — Other Ambulatory Visit (INDEPENDENT_AMBULATORY_CARE_PROVIDER_SITE_OTHER): Payer: Medicare Other

## 2023-11-12 DIAGNOSIS — E785 Hyperlipidemia, unspecified: Secondary | ICD-10-CM | POA: Diagnosis not present

## 2023-11-12 DIAGNOSIS — E559 Vitamin D deficiency, unspecified: Secondary | ICD-10-CM

## 2023-11-12 DIAGNOSIS — E1169 Type 2 diabetes mellitus with other specified complication: Secondary | ICD-10-CM

## 2023-11-12 DIAGNOSIS — I10 Essential (primary) hypertension: Secondary | ICD-10-CM

## 2023-11-12 DIAGNOSIS — E538 Deficiency of other specified B group vitamins: Secondary | ICD-10-CM

## 2023-11-12 DIAGNOSIS — Z7984 Long term (current) use of oral hypoglycemic drugs: Secondary | ICD-10-CM

## 2023-11-12 LAB — CBC WITH DIFFERENTIAL/PLATELET
Basophils Absolute: 0 10*3/uL (ref 0.0–0.1)
Basophils Relative: 0.5 % (ref 0.0–3.0)
Eosinophils Absolute: 0.1 10*3/uL (ref 0.0–0.7)
Eosinophils Relative: 2.1 % (ref 0.0–5.0)
HCT: 35.5 % — ABNORMAL LOW (ref 36.0–46.0)
Hemoglobin: 11.8 g/dL — ABNORMAL LOW (ref 12.0–15.0)
Lymphocytes Relative: 33.2 % (ref 12.0–46.0)
Lymphs Abs: 1.5 10*3/uL (ref 0.7–4.0)
MCHC: 33.3 g/dL (ref 30.0–36.0)
MCV: 90.1 fL (ref 78.0–100.0)
Monocytes Absolute: 0.4 10*3/uL (ref 0.1–1.0)
Monocytes Relative: 9.1 % (ref 3.0–12.0)
Neutro Abs: 2.4 10*3/uL (ref 1.4–7.7)
Neutrophils Relative %: 55.1 % (ref 43.0–77.0)
Platelets: 140 10*3/uL — ABNORMAL LOW (ref 150.0–400.0)
RBC: 3.94 Mil/uL (ref 3.87–5.11)
RDW: 13.8 % (ref 11.5–15.5)
WBC: 4.4 10*3/uL (ref 4.0–10.5)

## 2023-11-12 LAB — LIPID PANEL
Cholesterol: 128 mg/dL (ref 0–200)
HDL: 48.5 mg/dL (ref >39.00)
LDL Cholesterol: 51 mg/dL (ref 0–99)
NonHDL: 79.38
Total CHOL/HDL Ratio: 3
Triglycerides: 141 mg/dL (ref 0.0–149.0)
VLDL: 28.2 mg/dL (ref 0.0–40.0)

## 2023-11-12 LAB — VITAMIN D 25 HYDROXY (VIT D DEFICIENCY, FRACTURES): VITD: 46.9 ng/mL (ref 30.00–100.00)

## 2023-11-12 LAB — COMPREHENSIVE METABOLIC PANEL
ALT: 22 U/L (ref 0–35)
AST: 28 U/L (ref 0–37)
Albumin: 4.4 g/dL (ref 3.5–5.2)
Alkaline Phosphatase: 80 U/L (ref 39–117)
BUN: 29 mg/dL — ABNORMAL HIGH (ref 6–23)
CO2: 27 meq/L (ref 19–32)
Calcium: 10.6 mg/dL — ABNORMAL HIGH (ref 8.4–10.5)
Chloride: 107 meq/L (ref 96–112)
Creatinine, Ser: 1.1 mg/dL (ref 0.40–1.20)
GFR: 45.91 mL/min — ABNORMAL LOW (ref >60.00)
Glucose, Bld: 153 mg/dL — ABNORMAL HIGH (ref 70–99)
Potassium: 4.7 meq/L (ref 3.5–5.1)
Sodium: 142 meq/L (ref 135–145)
Total Bilirubin: 0.6 mg/dL (ref 0.2–1.2)
Total Protein: 6.7 g/dL (ref 6.0–8.3)

## 2023-11-12 LAB — TSH: TSH: 2.9 u[IU]/mL (ref 0.35–5.50)

## 2023-11-12 LAB — VITAMIN B12: Vitamin B-12: 1321 pg/mL — ABNORMAL HIGH (ref 211–911)

## 2023-11-19 ENCOUNTER — Ambulatory Visit (INDEPENDENT_AMBULATORY_CARE_PROVIDER_SITE_OTHER): Payer: Medicare HMO | Admitting: Family Medicine

## 2023-11-19 ENCOUNTER — Encounter: Payer: Self-pay | Admitting: Family Medicine

## 2023-11-19 VITALS — BP 135/60 | HR 60 | Temp 97.7°F | Ht 62.0 in | Wt 124.4 lb

## 2023-11-19 DIAGNOSIS — E559 Vitamin D deficiency, unspecified: Secondary | ICD-10-CM

## 2023-11-19 DIAGNOSIS — N182 Chronic kidney disease, stage 2 (mild): Secondary | ICD-10-CM

## 2023-11-19 DIAGNOSIS — Z1231 Encounter for screening mammogram for malignant neoplasm of breast: Secondary | ICD-10-CM | POA: Insufficient documentation

## 2023-11-19 DIAGNOSIS — I10 Essential (primary) hypertension: Secondary | ICD-10-CM | POA: Diagnosis not present

## 2023-11-19 DIAGNOSIS — I480 Paroxysmal atrial fibrillation: Secondary | ICD-10-CM

## 2023-11-19 DIAGNOSIS — E538 Deficiency of other specified B group vitamins: Secondary | ICD-10-CM | POA: Diagnosis not present

## 2023-11-19 DIAGNOSIS — E1169 Type 2 diabetes mellitus with other specified complication: Secondary | ICD-10-CM | POA: Diagnosis not present

## 2023-11-19 DIAGNOSIS — M810 Age-related osteoporosis without current pathological fracture: Secondary | ICD-10-CM

## 2023-11-19 DIAGNOSIS — I739 Peripheral vascular disease, unspecified: Secondary | ICD-10-CM

## 2023-11-19 DIAGNOSIS — Z0000 Encounter for general adult medical examination without abnormal findings: Secondary | ICD-10-CM

## 2023-11-19 DIAGNOSIS — E1122 Type 2 diabetes mellitus with diabetic chronic kidney disease: Secondary | ICD-10-CM

## 2023-11-19 DIAGNOSIS — D500 Iron deficiency anemia secondary to blood loss (chronic): Secondary | ICD-10-CM

## 2023-11-19 DIAGNOSIS — N289 Disorder of kidney and ureter, unspecified: Secondary | ICD-10-CM

## 2023-11-19 DIAGNOSIS — E785 Hyperlipidemia, unspecified: Secondary | ICD-10-CM

## 2023-11-19 NOTE — Assessment & Plan Note (Signed)
 Disc goals for lipids and reasons to control them Rev last labs with pt Rev low sat fat diet in detail Continues zetia 10 mg daily  Crestor 40 mg daily  LDL of 51 Well controlled

## 2023-11-19 NOTE — Assessment & Plan Note (Signed)
 Continues cardiology care Blood pressure and cholesterol are controlled

## 2023-11-19 NOTE — Patient Instructions (Addendum)
 If you are interested in the new shingles vaccine (Shingrix) - call your local pharmacy to check on coverage and availability     Take a look at the info on alendronate for osteoporosis - fracture prevention  We would hold off on anything until dental work is done Let us know if you want to try it   Stay active Add some strength training to your routine, this is important for bone and brain health and can reduce your risk of falls and help your body use insulin properly and regulate weight  Light weights, exercise bands , and internet videos are a good way to start  Yoga (chair or regular), machines , floor exercises or a gym with machines are also good options    Stop the calcium  Continue the vitamin D by itself at least 4000 international units daily   Cut your B12 to 500 mcg daily   You have an order for:  []   2D Mammogram  [x]   3D Mammogram  [x]   Bone Density     Please call for appointment:   [x]   [facility]  [address] [place] [address]  [phone]  []   [facility])   [address]  [place], [place] [address]  [phone]  []   [facility]      [address] [place], [place]        [phone]         []   [facility]  [address] [place], [place]  [phone]  []  [facility]   [address]   [place], [place] [address]  [phone]  []  [facility]  [address] [place], [place] [address] [phone]    Make sure to wear two piece clothing  No lotions powders or deodorants the day of the appointment Make sure to bring picture ID and insurance card.  Bring list of medications you are currently taking including any supplements.   Schedule your screening mammogram through MyChart!    Select [place] imaging sites can now be scheduled through MyChart.  Log into your MyChart account.  Go to 'Visit' (or 'Appointments' if  on mobile App) --> Schedule an  Appointment  Under 'Select a Reason for Visit' choose the Mammogram  Screening option.  Complete the pre-visit questions  and select the time and place that  best fits your schedule

## 2023-11-19 NOTE — Assessment & Plan Note (Signed)
 GFR 45.9 In setting of renal artery stenosis  Ca mildly elevated  Watching  Encouraged fluids if not restricted

## 2023-11-19 NOTE — Assessment & Plan Note (Signed)
 bp in fair control at this time  BP Readings from Last 1 Encounters:  11/19/23 135/60   No changes needed Most recent labs reviewed  Disc lifstyle change with low sodium diet and exercise  Under card and vasc care Coreg 3.125 bid Lisinopril 10 mg daily

## 2023-11-19 NOTE — Progress Notes (Signed)
 Subjective:    Patient ID: Tracey [name], female    DOB: [birth year], 88 y.o.   MRN: [id number]  HPI  Here for health maintenance exam and to review chronic medical problems   Wt Readings from Last 3 Encounters:  11/19/23 124 lb 6 oz (56.4 kg)  08/23/23 123 lb (55.8 kg)  07/23/23 123 lb 2 oz (55.8 kg)   22.75 kg/m  Vitals:   11/19/23 1030 11/19/23 1104  BP: (!) 142/64 135/60  Pulse: 60   Temp: 97.7 F (36.5 C)   SpO2: 98%     Immunization History  Administered Date(s) Administered   Fluad Quad(high Dose 65+) 06/15/2019, 09/26/2020   Influenza Split 08/10/2015   Influenza, High Dose Seasonal PF 11/05/2022   Influenza,inj,Quad PF,6+ Mos 05/24/2016, 07/26/2017, 06/03/2018, 07/11/2021   Influenza-Unspecified 08/10/2015, 05/24/2016   Moderna Sars-Covid-2 Vaccination 10/02/2019, 10/30/2019, 08/01/2020   Pneumococcal Conjugate-13 11/05/2016   Pneumococcal Polysaccharide-23 12/11/2017   Td 03/17/2013    Health Maintenance Due  Topic Date Due   Zoster Vaccines- Shingrix (1 of 2) Never done   HEMOGLOBIN A1C  10/02/2023   FOOT EXAM  11/14/2023   Feeling pretty good today   Just had her flu shot  Shingrix - plans to go back for it soon   Mammogram 01/2023 -plans to get another one  Self breast exam-no lumps   Gyn health No problems   Colon cancer screening out aged   Bone health  Dexa 12/2021  Osteoporosis  Falls Fractures- periprosthetic fracture around prosthetic knee in the fall  Femur fracture  May be interested in alendronate  Supplements ca and D  Last vitamin D Lab Results  Component Value Date   VD25OH 46.90 11/12/2023    Exercise  PT exercises for her hip twice daily  Walking with a cane with caution    Getting a new partial / dental work planned    Mood    11/19/2023   10:46 AM 08/23/2023    2:00 PM 09/05/2022    1:35 PM 08/24/2021    2:07 PM 04/14/2021   12:39 PM  Depression screen PHQ 2/9  Decreased Interest 0 0 0 0 0  Down,  Depressed, Hopeless 1 0 0 0 0  PHQ - 2 Score 1 0 0 0 0  Altered sleeping 2      Tired, decreased energy 1      Change in appetite 2      Feeling bad or failure about yourself  0      Trouble concentrating 1      Moving slowly or fidgety/restless 0      Suicidal thoughts 0      PHQ-9 Score 7      Difficult doing work/chores Somewhat difficult       HTN bp is stable today  No cp or palpitations or headaches or edema  No side effects to medicines  BP Readings from Last 3 Encounters:  11/19/23 135/60  07/23/23 (!) 140/58  07/04/23 (!) 120/40    Coreg 3.125 bid Lisinopril 10 mg daily Pulse Readings from Last 3 Encounters:  11/19/23 60  07/23/23 67  07/04/23 64     Under cardiology care for this and PAD, renal art stenosis, CAD and PAF  Plavix 75 mg daily No more a fib spells   Lab Results  Component Value Date   NA 142 11/12/2023   K 4.7 11/12/2023   CO2 27 11/12/2023   GLUCOSE 153 (H) 11/12/2023   BUN  29 (H) 11/12/2023   CREATININE 1.10 11/12/2023   CALCIUM 10.6 (H) 11/12/2023   GFR 45.91 (L) 11/12/2023   GFRNONAA 47 (L) 07/04/2023   Lab Results  Component Value Date   PTH 68 12/13/2022   CALCIUM 10.6 (H) 11/12/2023   PHOS 1.9 (L) 07/04/2023     GERD Protonix 40 mg every other day  Lab Results  Component Value Date   VITAMINB12 1,321 (H) 11/12/2023  No B12 shots  Takes 1000 mcg daily     DM2 Sees endocrinology  Due for a follow up  Has appointment 12/02/23 Glucose is labile  One teens to 160s Eats healthy - avoids processed carbs     Hyperlipidemia Lab Results  Component Value Date   CHOL 128 11/12/2023   CHOL 125 11/13/2022   CHOL 125 11/01/2021   Lab Results  Component Value Date   HDL 48.50 11/12/2023   HDL 49.20 11/13/2022   HDL 48.50 11/01/2021   Lab Results  Component Value Date   LDLCALC 51 11/12/2023   LDLCALC 55 11/13/2022   LDLCALC 60 11/01/2021   Lab Results  Component Value Date   TRIG 141.0 11/12/2023   TRIG 104.0  11/13/2022   TRIG 79.0 11/01/2021   Lab Results  Component Value Date   CHOLHDL 3 11/12/2023   CHOLHDL 3 11/13/2022   CHOLHDL 3 11/01/2021   No results found for: "LDLDIRECT" Zetia 10 mg daily  Crestor 40 mg daily   Lab Results  Component Value Date   TSH 2.90 11/12/2023     Anemia Lab Results  Component Value Date   WBC 4.4 11/12/2023   HGB 11.8 (L) 11/12/2023   HCT 35.5 (L) 11/12/2023   MCV 90.1 11/12/2023   PLT 140.0 (L) 11/12/2023   Normal mcv Chronic dz  Post surgical  in the past  Lab Results  Component Value Date   IRON 28 07/03/2023   TIBC 312 07/03/2023   FERRITIN 77 07/03/2023      Patient Active Problem List   Diagnosis Date Noted   Encounter for screening mammogram for breast cancer 11/19/2023   Anemia, iron deficiency 07/08/2023   Constipation 07/08/2023   Periprosthetic fracture around prosthetic knee 06/30/2023   Fall down steps 06/30/2023   History of cardiac dysrhythmia 06/30/2023   Leukocytes in urine 03/15/2022   Kyphoscoliosis 11/24/2021   Low back pain 11/21/2021   Estrogen deficiency 11/01/2021   Arthritis of carpometacarpal (CMC) joint of left thumb 05/19/2021   Muscle cramps 05/15/2021   Distal radius fracture, left 05/05/2021   Dysuria 04/14/2021   Hypercalcemia 03/01/2020   General weakness 02/05/2020   Total knee replacement status 01/06/2020   Rectocele 09/27/2019   Vaginal atrophy 09/27/2019   Renal artery stenosis (HCC) 06/30/2019   Varicose veins of both lower extremities with inflammation 05/27/2019   Essential hypertension, benign 05/07/2019   Coronary artery disease of native artery of native heart with stable angina pectoris (HCC) 04/01/2019   Syncope 12/25/2018   Palpitations 12/25/2018   PAD (peripheral artery disease) (HCC) 12/25/2018   Stenosis of left carotid artery 12/25/2018   Primary osteoarthritis of left knee 04/27/2018   Weight loss 01/13/2018   PAF (paroxysmal atrial fibrillation) (HCC) 11/11/2017    Routine general medical examination at a health care facility 11/11/2017   Renal insufficiency 11/11/2017   Osteoporosis 04/30/2017   Vitamin D deficiency 02/03/2017   Vitamin B12 deficiency 11/05/2016   Change in bowel habits 03/07/2015   FH: colon cancer 03/07/2015  GERD (gastroesophageal reflux disease) 03/07/2015   Osteoarthritis 03/15/2014   Status post aorto-coronary artery bypass graft 01/22/2012   Hyperlipidemia associated with type 2 diabetes mellitus (HCC) 01/22/2012   Type 2 diabetes mellitus with stage 2 chronic kidney disease, without long-term current use of insulin (HCC) 01/22/2012   Atherosclerosis of renal artery (HCC) 01/22/2012   Past Medical History:  Diagnosis Date   Anxiety    Arthritis    Atrial fibrillation (HCC)    history   Chronic kidney disease    ckd stage II   Coronary artery disease    Diabetes mellitus    non insulin dependent   Dysrhythmia    atrial fibrillation, history of   GERD (gastroesophageal reflux disease)    Heart murmur    History of kidney stones    Hyperlipidemia    Hypertension    Peripheral vascular disease (HCC)    Rectocele    Syncope 09/2019   UTI (urinary tract infection)    Past Surgical History:  Procedure Laterality Date   ABDOMINAL AORTA STENT     APPENDECTOMY     BREAST BIOPSY Left    20 years ago   BREAST BIOPSY Right 2008   BREAST EXCISIONAL BIOPSY     rt 2008 lt "20 years ago"   CARDIAC CATHETERIZATION     CATARACT EXTRACTION W/PHACO Left 08/15/2015   Procedure: CATARACT EXTRACTION PHACO AND INTRAOCULAR LENS PLACEMENT (IOC);  Surgeon: Sallee Lange, MD;  Location: [facility] ORS;  Service: Ophthalmology;  Laterality: Left;  [place] 01:28 AP% 26.4 CDE 35.73 fluid pack # [id number]   COLONOSCOPY WITH PROPOFOL N/A 01/16/2016   Procedure: COLONOSCOPY WITH PROPOFOL;  Surgeon: Scot Jun, MD;  Location: [facility] ENDOSCOPY;  Service: Endoscopy;  Laterality: N/A;   CORONARY ANGIOPLASTY WITH STENT PLACEMENT     CORONARY  ARTERY BYPASS GRAFT  1995   with LIMA  to the LAD, SVG to OM1, SVG to PDA   ESOPHAGOGASTRODUODENOSCOPY (EGD) WITH PROPOFOL N/A 01/16/2016   Procedure: ESOPHAGOGASTRODUODENOSCOPY (EGD) WITH PROPOFOL;  Surgeon: Scot Jun, MD;  Location: [facility] [place] END[facility];  Service: Endoscopy;  Laterality: N/A;   EYE SURGERY     JOINT REPLACEMENT Right    KNEE ARTHROPLASTY Left 01/06/2020   Procedure: COMPUTER ASSISTED TOTAL KNEE ARTHROPLASTY;  Surgeon: Donato Heinz, MD;  Location: [facility] ORS;  Service: Orthopedics;  Laterality: Left;   LOWER EXTREMITY ANGIOGRAPHY N/A 07/08/2019   Procedure: Abdominal Aortagraphy;  Surgeon: Annice Needy, MD;  Location: [facility] INVASIVE CV LAB;  Service: Cardiovascular;  Laterality: N/A;   ORIF FEMUR FRACTURE Left 07/01/2023   Procedure: OPEN REDUCTION INTERNAL FIXATION (ORIF) DISTAL FEMUR FRACTURE;  Surgeon: Roby Lofts, MD;  Location: [facility] OR;  Service: Orthopedics;  Laterality: Left;   RENAL ANGIOGRAPHY Left 08/26/2019   Procedure: RENAL ANGIOGRAPHY;  Surgeon: Annice Needy, MD;  Location: [facility] INVASIVE CV LAB;  Service: Cardiovascular;  Laterality: Left;   RENAL ARTERY STENT     left, By Dr Earnestine Leys   REPLACEMENT TOTAL KNEE  03/25/2013   right   TONSILLECTOMY     Social History   Tobacco Use   Smoking status: Never   Smokeless tobacco: Never  Vaping Use   Vaping status: Never Used  Substance Use Topics   Alcohol use: No   Drug use: No   Family History  Problem Relation Age of Onset   Heart disease Father    Other Brother        open heart surgery   Cancer  Brother    Other Brother        open heart surgery   Other Brother        open heart surgery   Cancer Daughter    Hyperlipidemia Daughter    Breast cancer Neg Hx    Kidney cancer Neg Hx    Bladder Cancer Neg Hx    Allergies  Allergen Reactions   Ferrous Sulfate Anaphylaxis and Rash   Codeine Nausea Only    Sick to her stomach   Erythromycin Nausea And Vomiting and Other (See Comments)   Tramadol Nausea  Only and Other (See Comments)    Loss of appetite and did not eat   Ciprofloxacin Diarrhea    Upsets stomach   Current Outpatient Medications on File Prior to Visit  Medication Sig Dispense Refill   carvedilol (COREG) 3.125 MG tablet TAKE ONE TABLET BY MOUTH ONCE DAILY 90 tablet 3   clopidogrel (PLAVIX) 75 MG tablet TAKE ONE TABLET BY MOUTH ONCE DAILY 90 tablet 2   CRANBERRY PO Take 1 tablet by mouth daily.     cyanocobalamin 1000 MCG tablet Take 1 tablet (1,000 mcg total) by mouth daily. (Patient taking differently: Take 500 mcg by mouth daily.)     ezetimibe (ZETIA) 10 MG tablet TAKE ONE TABLET BY MOUTH ONCE DAILY 90 tablet 3   glipiZIDE (GLUCOTROL XL) 5 MG 24 hr tablet TAKE ONE TABLET BY MOUTH EVERY DAY WITH WITH BREAKFAST 90 tablet 1   glucose blood (ONETOUCH VERIO) test strip Use as instructed to monitor glucose twice daily 100 each 3   lisinopril (ZESTRIL) 10 MG tablet TAKE ONE TABLET BY MOUTH ONCE DAILY 90 tablet 3   metFORMIN (GLUCOPHAGE-XR) 500 MG 24 hr tablet Take 1 tablet (500 mg total) by mouth daily. 90 tablet 3   Multiple Vitamins-Minerals (MULTIVITAMIN WITH MINERALS) tablet Take 1 tablet by mouth daily.     nitroGLYCERIN (NITROSTAT) 0.4 MG SL tablet Place 1 tablet (0.4 mg total) under the tongue every 5 (five) minutes as needed for chest pain. 25 tablet 3   ondansetron (ZOFRAN-ODT) 4 MG disintegrating tablet Take 1-2 tablets by mouth every 8 hrs as needed for nausea 30 tablet 0   OneTouch Delica Lancets 33G MISC USE TO TEST TWICE A DAY 100 each 1   pantoprazole (PROTONIX) 40 MG tablet TAKE ONE TABLET BY MOUTH EVERY OTHER DAY 45 tablet 0   rosuvastatin (CRESTOR) 40 MG tablet TAKE ONE TABLET BY MOUTH ONCE DAILY 90 tablet 3   senna-docusate (SENOKOT-S) 8.6-50 MG tablet Take 2 tablets by mouth 2 (two) times daily between meals as needed for mild constipation.     No current facility-administered medications on file prior to visit.    Review of Systems  Constitutional:  Negative  for activity change, appetite change, fatigue, fever and unexpected weight change.  HENT:  Negative for congestion, ear pain, rhinorrhea, sinus pressure and sore throat.   Eyes:  Negative for pain, redness and visual disturbance.  Respiratory:  Negative for cough, shortness of breath and wheezing.   Cardiovascular:  Negative for chest pain and palpitations.  Gastrointestinal:  Negative for abdominal pain, blood in stool, constipation and diarrhea.  Endocrine: Negative for polydipsia and polyuria.  Genitourinary:  Negative for dysuria, frequency and urgency.  Musculoskeletal:  Positive for arthralgias. Negative for back pain and myalgias.  Skin:  Negative for pallor and rash.  Allergic/Immunologic: Negative for environmental allergies.  Neurological:  Negative for dizziness, syncope and headaches.  Hematological:  Negative  for adenopathy. Does not bruise/bleed easily.  Psychiatric/Behavioral:  Negative for decreased concentration and dysphoric mood. The patient is not nervous/anxious.        Objective:   Physical Exam Constitutional:      General: She is not in acute distress.    Appearance: Normal appearance. She is well-developed and normal weight. She is not ill-appearing or diaphoretic.  HENT:     Head: Normocephalic and atraumatic.     Right Ear: Tympanic membrane, ear canal and external ear normal.     Left Ear: Tympanic membrane, ear canal and external ear normal.     Nose: Nose normal. No congestion.     Mouth/Throat:     Mouth: Mucous membranes are moist.     Pharynx: Oropharynx is clear. No posterior oropharyngeal erythema.  Eyes:     General: No scleral icterus.    Extraocular Movements: Extraocular movements intact.     Conjunctiva/sclera: Conjunctivae normal.     Pupils: Pupils are equal, round, and reactive to light.  Neck:     Thyroid: No thyromegaly.     Vascular: No carotid bruit or JVD.  Cardiovascular:     Rate and Rhythm: Normal rate and regular rhythm.      Pulses: Normal pulses.     Heart sounds: Normal heart sounds.     No gallop.  Pulmonary:     Effort: Pulmonary effort is normal. No respiratory distress.     Breath sounds: Normal breath sounds. No wheezing.     Comments: Good air exch Chest:     Chest wall: No tenderness.  Abdominal:     General: Bowel sounds are normal. There is no distension or abdominal bruit.     Palpations: Abdomen is soft. There is no mass.     Tenderness: There is no abdominal tenderness.     Hernia: No hernia is present.  Genitourinary:    Comments: Breast exam: No mass, nodules, thickening, tenderness, bulging, retraction, inflamation, nipple discharge or skin changes noted.  No axillary or clavicular LA.     Musculoskeletal:        General: No tenderness. Normal range of motion.     Cervical back: Normal range of motion and neck supple. No rigidity. No muscular tenderness.     Right lower leg: No edema.     Left lower leg: No edema.     Comments: Moderate  kyphosis   Lymphadenopathy:     Cervical: No cervical adenopathy.  Skin:    General: Skin is warm and dry.     Coloration: Skin is not pale.     Findings: No erythema or rash.     Comments: Solar lentigines diffusely Scattered sks   Neurological:     Mental Status: She is alert. Mental status is at baseline.     Cranial Nerves: No cranial nerve deficit.     Sensory: No sensory deficit.     Motor: No abnormal muscle tone.     Coordination: Coordination normal.     Gait: Gait normal.     Deep Tendon Reflexes: Reflexes are normal and symmetric. Reflexes normal.     Comments: Walking with cane  Psychiatric:        Mood and Affect: Mood normal.        Cognition and Memory: Cognition and memory normal.           Assessment & Plan:   Problem List Items Addressed This Visit       Cardiovascular and  Mediastinum   PAF (paroxysmal atrial fibrillation) (HCC)   Per pt no re occurances       PAD (peripheral artery disease) (HCC)   Continues  cardiology care Blood pressure and cholesterol are controlled       Essential hypertension, benign   bp in fair control at this time  BP Readings from Last 1 Encounters:  11/19/23 135/60   No changes needed Most recent labs reviewed  Disc lifstyle change with low sodium diet and exercise  Under card and vasc care Coreg 3.125 bid Lisinopril 10 mg daily           Endocrine   Type 2 diabetes mellitus with stage 2 chronic kidney disease, without long-term current use of insulin (HCC)   Continues endocrinology care Per pt glucose levels are labile       Hyperlipidemia associated with type 2 diabetes mellitus (HCC)   Disc goals for lipids and reasons to control them Rev last labs with pt Rev low sat fat diet in detail Continues zetia 10 mg daily  Crestor 40 mg daily  LDL of 51 Well controlled         Musculoskeletal and Integument   Osteoporosis   Discussed fall prevention, supplements and exercise for bone density  Dexa ordered Had FN fracture this year Given info on alendronate to read  She will call if open to trying it        Relevant Orders   DG Bone Density     Genitourinary   Renal insufficiency   GFR 45.9 In setting of renal artery stenosis  Ca mildly elevated  Watching  Encouraged fluids if not restricted         Other   Vitamin D deficiency   Last vitamin D Lab Results  Component Value Date   VD25OH 46.90 11/12/2023   Vitamin D level is therapeutic with current supplementation Disc importance of this to bone and overall health       Vitamin B12 deficiency   Lab Results  Component Value Date   VITAMINB12 1,321 (H) 11/12/2023   Instructed to cut to 500 mcg daily from 1000   Now taking ppi every other day instead of daily      Routine general medical examination at a health care facility - Primary   Reviewed health habits including diet and exercise and skin cancer prevention Reviewed appropriate screening tests for age  Also  reviewed health mt list, fam hx and immunization status , as well as social and family history   See HPI Labs reviewed and ordered Health Maintenance  Topic Date Due   Zoster (Shingles) Vaccine (1 of 2) Never done   Hemoglobin A1C  10/02/2023   Complete foot exam   11/14/2023   DTaP/Tdap/Td vaccine (2 - Tdap) 07/07/2024*   COVID-19 Vaccine (4 - 2024-25 season) 12/04/2024*   Mammogram  02/15/2024   Yearly kidney health urinalysis for diabetes  03/31/2024   Eye exam for diabetics  05/21/2024   Medicare Annual Wellness Visit  08/22/2024   Yearly kidney function blood test for diabetes  11/11/2024   Pneumonia Vaccine  Completed   Flu Shot  Completed   DEXA scan (bone density measurement)  Completed   HPV Vaccine  Aged Out  *Topic was postponed. The date shown is not the original due date.   Plans to get sihngrix  Discussed fall prevention, supplements and exercise for bone density  Mammo and dexa ordered  PHQ 0  Hypercalcemia   Lab Results  Component Value Date   CALCIUM 10.6 (H) 11/12/2023   PHOS 1.9 (L) 07/04/2023   Last PTH normal   Instructed to continue D but hold ca  Will watch  Has renal insuff       Encounter for screening mammogram for breast cancer   Mammogram ordered Pt will schedule       Relevant Orders   MM 3D SCREENING MAMMOGRAM BILATERAL BREAST   Anemia, iron deficiency   Improved since surgery in feb  Normal mcv now  Also renal insuff

## 2023-11-19 NOTE — Assessment & Plan Note (Signed)
 Reviewed health habits including diet and exercise and skin cancer prevention Reviewed appropriate screening tests for age  Also reviewed health mt list, fam hx and immunization status , as well as social and family history   See HPI Labs reviewed and ordered Health Maintenance  Topic Date Due   Zoster (Shingles) Vaccine (1 of 2) Never done   Hemoglobin A1C  10/02/2023   Complete foot exam   11/14/2023   DTaP/Tdap/Td vaccine (2 - Tdap) 07/07/2024*   COVID-19 Vaccine (4 - 2024-25 season) 12/04/2024*   Mammogram  02/15/2024   Yearly kidney health urinalysis for diabetes  03/31/2024   Eye exam for diabetics  05/21/2024   Medicare Annual Wellness Visit  08/22/2024   Yearly kidney function blood test for diabetes  11/11/2024   Pneumonia Vaccine  Completed   Flu Shot  Completed   DEXA scan (bone density measurement)  Completed   HPV Vaccine  Aged Out  *Topic was postponed. The date shown is not the original due date.   Plans to get sihngrix  Discussed fall prevention, supplements and exercise for bone density  Mammo and dexa ordered  PHQ 0

## 2023-11-19 NOTE — Assessment & Plan Note (Signed)
 Improved since surgery in feb  Normal mcv now  Also renal insuff

## 2023-11-19 NOTE — Assessment & Plan Note (Signed)
 Lab Results  Component Value Date   VITAMINB12 1,321 (H) 11/12/2023   Instructed to cut to 500 mcg daily from 1000   Now taking ppi every other day instead of daily

## 2023-11-19 NOTE — Assessment & Plan Note (Signed)
 Continues endocrinology care Per pt glucose levels are labile

## 2023-11-19 NOTE — Assessment & Plan Note (Signed)
 Per pt no re occurances

## 2023-11-19 NOTE — Assessment & Plan Note (Signed)
 Discussed fall prevention, supplements and exercise for bone density  Dexa ordered Had FN fracture this year Given info on alendronate to read  She will call if open to trying it

## 2023-11-19 NOTE — Assessment & Plan Note (Signed)
 Last vitamin D Lab Results  Component Value Date   VD25OH 46.90 11/12/2023   Vitamin D level is therapeutic with current supplementation Disc importance of this to bone and overall health

## 2023-11-19 NOTE — Assessment & Plan Note (Signed)
Mammogram ordered. Pt will schedule

## 2023-11-19 NOTE — Assessment & Plan Note (Signed)
 Lab Results  Component Value Date   CALCIUM 10.6 (H) 11/12/2023   PHOS 1.9 (L) 07/04/2023   Last PTH normal   Instructed to continue D but hold ca  Will watch  Has renal insuff

## 2023-12-03 ENCOUNTER — Encounter: Payer: Self-pay | Admitting: Nurse Practitioner

## 2023-12-03 ENCOUNTER — Ambulatory Visit (INDEPENDENT_AMBULATORY_CARE_PROVIDER_SITE_OTHER): Payer: BC Managed Care – PPO | Admitting: Nurse Practitioner

## 2023-12-03 VITALS — BP 120/60 | HR 78 | Ht 62.0 in | Wt 125.6 lb

## 2023-12-03 DIAGNOSIS — E559 Vitamin D deficiency, unspecified: Secondary | ICD-10-CM

## 2023-12-03 DIAGNOSIS — E782 Mixed hyperlipidemia: Secondary | ICD-10-CM | POA: Diagnosis not present

## 2023-12-03 DIAGNOSIS — I10 Essential (primary) hypertension: Secondary | ICD-10-CM | POA: Diagnosis not present

## 2023-12-03 DIAGNOSIS — N182 Chronic kidney disease, stage 2 (mild): Secondary | ICD-10-CM

## 2023-12-03 DIAGNOSIS — Z7984 Long term (current) use of oral hypoglycemic drugs: Secondary | ICD-10-CM

## 2023-12-03 DIAGNOSIS — E1122 Type 2 diabetes mellitus with diabetic chronic kidney disease: Secondary | ICD-10-CM

## 2023-12-03 LAB — POCT GLYCOSYLATED HEMOGLOBIN (HGB A1C): Hemoglobin A1C: 8.8 % — AB (ref 4.0–5.6)

## 2023-12-03 NOTE — Progress Notes (Signed)
 12/03/2023, 4:29 PM  Endocrinology follow-up note   Subjective:    Patient ID: Tracey [name], female    DOB: March 29, 1936.  Tracey [name] is being seen  in follow-up after she was seen in consultation for management of currently uncontrolled symptomatic diabetes requested by  [name], MD.   Past Medical History:  Diagnosis Date   Anxiety    Arthritis    Atrial fibrillation (HCC)    history   Chronic kidney disease    ckd stage II   Coronary artery disease    Diabetes mellitus    non insulin dependent   Dysrhythmia    atrial fibrillation, history of   GERD (gastroesophageal reflux disease)    Heart murmur    History of kidney stones    Hyperlipidemia    Hypertension    Peripheral vascular disease (HCC)    Rectocele    Syncope 09/2019   UTI (urinary tract infection)     Past Surgical History:  Procedure Laterality Date   ABDOMINAL AORTA STENT     APPENDECTOMY     BREAST BIOPSY Left    20 years ago   BREAST BIOPSY Right 2008   BREAST EXCISIONAL BIOPSY     rt 2008 lt "20 years ago"   CARDIAC CATHETERIZATION     CATARACT EXTRACTION W/PHACO Left 08/15/2015   Procedure: CATARACT EXTRACTION PHACO AND INTRAOCULAR LENS PLACEMENT (IOC);  Surgeon: Sallee Lange, MD;  Location: [facility] ORS;  Service: Ophthalmology;  Laterality: Left;  [place] 01:28 AP% 26.4 CDE 35.73 fluid pack # [id number]   COLONOSCOPY WITH PROPOFOL N/A 01/16/2016   Procedure: COLONOSCOPY WITH PROPOFOL;  Surgeon: Scot Jun, MD;  Location[facility] ENDOSCOPY;  Service: Endoscopy;  Laterality: N/A;   CORONARY ANGIOPLASTY WITH STENT PLACEMENT     CORONARY ARTERY BYPASS GRAFT  1995   with LIMA  to the LAD, SVG to OM1, SVG to PDA   ESOPHAGOGASTRODUODENOSCOPY (EGD) WITH PROPOFOL N/A 01/16/2016   Procedure: ESOPHAGOGASTRODUODENOSCOPY (EGD) WITH PROPOFOL;  Surgeon: Scot Jun, MD;  Location: [facility] ENDOSCOPY;  Service:  Endoscopy;  Laterality: N/A;   EYE SURGERY     JOINT REPLACEMENT Right    KNEE ARTHROPLASTY Left 01/06/2020   Procedure: COMPUTER ASSISTED TOTAL KNEE ARTHROPLASTY;  Surgeon: Donato Heinz, MD;  Location: [facility] ORS;  Service: Orthopedics;  Laterality: Left;   LOWER EXTREMITY ANGIOGRAPHY N/A 07/08/2019   Procedure: Abdominal Aortagraphy;  Surgeon: Annice Needy, MD;  Location: [facility] INVASIVE CV LAB;  Service: Cardiovascular;  Laterality: N/A;   ORIF FEMUR FRACTURE Left 07/01/2023   Procedure: OPEN REDUCTION INTERNAL FIXATION (ORIF) DISTAL FEMUR FRACTURE;  Surgeon: Roby Lofts, MD;  Location: [facility] OR;  Service: Orthopedics;  Laterality: Left;   RENAL ANGIOGRAPHY Left 08/26/2019   Procedure: RENAL ANGIOGRAPHY;  Surgeon: Annice Needy, MD;  Location: [facility] INVASIVE CV LAB;  Service: Cardiovascular;  Laterality: Left;   RENAL ARTERY STENT     left, By Dr Earnestine Leys   REPLACEMENT TOTAL KNEE  03/25/2013   right   TONSILLECTOMY      Social History   Socioeconomic History  Marital status: Married    Spouse name: Not on file   Number of children: Not on file   Years of education: Not on file   Highest education level: Not on file  Occupational History   Occupation: school cafeteria    Comment: retired  Tobacco Use   Smoking status: Never   Smokeless tobacco: Never  Vaping Use   Vaping status: Never Used  Substance and Sexual Activity   Alcohol use: No   Drug use: No   Sexual activity: Never  Other Topics Concern   Not on file  Social History Narrative   Patient has 3 daughters. Remarried June 2021.   Social Drivers of Corporate investment banker Strain: Low Risk  (08/23/2023)   Overall Financial Resource Strain (CARDIA)    Difficulty of Paying Living Expenses: Not hard at all  Food Insecurity: No Food Insecurity (08/23/2023)   Hunger Vital Sign    Worried About Running Out of Food in the Last Year: Never true    Ran Out of Food in the Last Year: Never true  Transportation Needs: No  Transportation Needs (08/23/2023)   PRAPARE - Administrator, Civil Service (Medical): No    Lack of Transportation (Non-Medical): No  Physical Activity: Insufficiently Active (08/23/2023)   Exercise Vital Sign    Days of Exercise per Week: 4 days    Minutes of Exercise per Session: 30 min  Stress: No Stress Concern Present (08/23/2023)   [employer] of Occupational Health - Occupational Stress Questionnaire    Feeling of Stress : Only a little  Social Connections: Socially Integrated (08/23/2023)   Social Connection and Isolation Panel [NHANES]    Frequency of Communication with Friends and Family: More than three times a week    Frequency of Social Gatherings with Friends and Family: More than three times a week    Attends Religious Services: More than 4 times per year    Active Member of [employer] or Organizations: Yes    Attends Engineer, structural: More than 4 times per year    Marital Status: Married    Family History  Problem Relation Age of Onset   Heart disease Father    Other Brother        open heart surgery   Cancer Brother    Other Brother        open heart surgery   Other Brother        open heart surgery   Cancer Daughter    Hyperlipidemia Daughter    Breast cancer Neg Hx    Kidney cancer Neg Hx    Bladder Cancer Neg Hx     Outpatient Encounter Medications as of 12/03/2023  Medication Sig   carvedilol (COREG) 3.125 MG tablet TAKE ONE TABLET BY MOUTH ONCE DAILY   Cholecalciferol (VITAMIN D3) 1.25 MG (50000 UT) CAPS Take by mouth daily.   clopidogrel (PLAVIX) 75 MG tablet TAKE ONE TABLET BY MOUTH ONCE DAILY   CRANBERRY PO Take 1 tablet by mouth daily.   ezetimibe (ZETIA) 10 MG tablet TAKE ONE TABLET BY MOUTH ONCE DAILY   glucose blood (ONETOUCH VERIO) test strip Use as instructed to monitor glucose twice daily   lisinopril (ZESTRIL) 10 MG tablet TAKE ONE TABLET BY MOUTH ONCE DAILY   metFORMIN (GLUCOPHAGE-XR) 500 MG 24 hr tablet Take 1  tablet (500 mg total) by mouth daily.   Multiple Vitamins-Minerals (MULTIVITAMIN WITH MINERALS) tablet Take 1 tablet by mouth daily.  nitroGLYCERIN (NITROSTAT) 0.4 MG SL tablet Place 1 tablet (0.4 mg total) under the tongue every 5 (five) minutes as needed for chest pain.   OneTouch Delica Lancets 33G MISC USE TO TEST TWICE A DAY   pantoprazole (PROTONIX) 40 MG tablet TAKE ONE TABLET BY MOUTH EVERY OTHER DAY   polyethylene glycol (MIRALAX / GLYCOLAX) 17 g packet Take 17 g by mouth daily. 1/2 capful as needed   rosuvastatin (CRESTOR) 40 MG tablet TAKE ONE TABLET BY MOUTH ONCE DAILY   [DISCONTINUED] glipiZIDE (GLUCOTROL XL) 5 MG 24 hr tablet TAKE ONE TABLET BY MOUTH EVERY DAY WITH WITH BREAKFAST   cyanocobalamin 1000 MCG tablet Take 1 tablet (1,000 mcg total) by mouth daily. (Patient not taking: Reported on 12/03/2023)   ondansetron (ZOFRAN-ODT) 4 MG disintegrating tablet Take 1-2 tablets by mouth every 8 hrs as needed for nausea (Patient not taking: Reported on 12/03/2023)   senna-docusate (SENOKOT-S) 8.6-50 MG tablet Take 2 tablets by mouth 2 (two) times daily between meals as needed for mild constipation. (Patient not taking: Reported on 12/03/2023)   No facility-administered encounter medications on file as of 12/03/2023.    ALLERGIES: Allergies  Allergen Reactions   Ferrous Sulfate Anaphylaxis and Rash   Codeine Nausea Only    Sick to her stomach   Erythromycin Nausea And Vomiting and Other (See Comments)   Tramadol Nausea Only and Other (See Comments)    Loss of appetite and did not eat   Ciprofloxacin Diarrhea    Upsets stomach    VACCINATION STATUS: Immunization History  Administered Date(s) Administered   Fluad Quad(high Dose 65+) 06/15/2019, 09/26/2020   Influenza Split 08/10/2015   Influenza, High Dose Seasonal PF 11/05/2022   Influenza,inj,Quad PF,6+ Mos 05/24/2016, 07/26/2017, 06/03/2018, 07/11/2021   Influenza-Unspecified 08/10/2015, 05/24/2016   Moderna Sars-Covid-2  Vaccination 10/02/2019, 10/30/2019, 08/01/2020   Pneumococcal Conjugate-13 11/05/2016   Pneumococcal Polysaccharide-23 12/11/2017   Td 03/17/2013    Diabetes She presents for her follow-up diabetic visit. She has type 2 diabetes mellitus. Onset time: She was diagnosed at approximate age of 33 years. Her disease course has been worsening. There are no hypoglycemic associated symptoms. Pertinent negatives for hypoglycemia include no confusion, headaches, pallor or seizures. Associated symptoms include blurred vision (especially in the evenings) and fatigue. Pertinent negatives for diabetes include no chest pain, no polydipsia, no polyphagia, no polyuria and no weight loss. There are no hypoglycemic complications. Symptoms are stable. Diabetic complications include heart disease, nephropathy and PVD. Risk factors for coronary artery disease include dyslipidemia, diabetes mellitus, hypertension, sedentary lifestyle and post-menopausal. Current diabetic treatment includes oral agent (dual therapy). She is compliant with treatment all of the time. Her weight is fluctuating minimally. She is following a generally healthy diet. Meal planning includes avoidance of concentrated sweets and ADA exchanges. She rarely participates in exercise. Her home blood glucose trend is fluctuating minimally. Her breakfast blood glucose range is generally 130-140 mg/dl. (She presents today with her meter and logs showing slightly above target fasting glycemic profile.  Her POCT A1c today is 8.8%, increasing from last visit of 8.1%.  She notes she broke her femur since last visit and has really had a hard time recovering.  She does have symptoms of hypoglycemia occasionally.) An ACE inhibitor/angiotensin II receptor blocker is being taken. She does not see a podiatrist.Eye exam is current.  Hyperlipidemia This is a chronic problem. The current episode started more than 1 year ago. The problem is controlled. Recent lipid tests were  reviewed and are  normal. Exacerbating diseases include chronic renal disease and diabetes. Factors aggravating her hyperlipidemia include beta blockers. Pertinent negatives include no chest pain, myalgias or shortness of breath. Current antihyperlipidemic treatment includes statins. The current treatment provides moderate improvement of lipids. There are no compliance problems.  Risk factors for coronary artery disease include dyslipidemia, diabetes mellitus, hypertension, post-menopausal and a sedentary lifestyle.  Hypertension This is a chronic problem. The current episode started more than 1 year ago. The problem has been gradually improving since onset. The problem is controlled. Associated symptoms include blurred vision (especially in the evenings). Pertinent negatives include no chest pain, headaches, palpitations or shortness of breath. There are no associated agents to hypertension. Risk factors for coronary artery disease include diabetes mellitus, dyslipidemia, sedentary lifestyle and post-menopausal state. Past treatments include beta blockers and ACE inhibitors. The current treatment provides moderate improvement. There are no compliance problems.  Hypertensive end-organ damage includes kidney disease, CAD/MI and PVD. Identifiable causes of hypertension include chronic renal disease.    Review of systems  Constitutional: + Minimally fluctuating body weight,  current Body mass index is 22.97 kg/m. , no fatigue, no subjective hyperthermia, no subjective hypothermia Eyes: no blurry vision, no xerophthalmia ENT: no sore throat, no nodules palpated in throat, no dysphagia/odynophagia, no hoarseness Cardiovascular: no chest pain, no shortness of breath, no palpitations, no leg swelling Respiratory: no cough, no shortness of breath Gastrointestinal: no nausea/vomiting/diarrhea Musculoskeletal: no muscle/joint aches Skin: no rashes, no hyperemia Neurological: no tremors, no numbness, no  tingling, no dizziness Psychiatric: no depression, no anxiety   Objective:    BP 120/60 (BP Location: Left Arm, Patient Position: Sitting, Cuff Size: Large)   Pulse 78   Ht 5\' 2"  (1.575 m)   Wt 125 lb 9.6 oz (57 kg)   BMI 22.97 kg/m   Wt Readings from Last 3 Encounters:  12/03/23 125 lb 9.6 oz (57 kg)  11/19/23 124 lb 6 oz (56.4 kg)  08/23/23 123 lb (55.8 kg)    BP Readings from Last 3 Encounters:  12/03/23 120/60  11/19/23 135/60  07/23/23 (!) 140/58     Physical Exam- Limited  Constitutional:  Body mass index is 22.97 kg/m. , not in acute distress, normal state of mind Eyes:  EOMI, no exophthalmos Musculoskeletal: no gross deformities, strength intact in all four extremities, no gross restriction of joint movements Skin:  no rashes, no hyperemia Neurological: no tremor with outstretched hands    CMP ( most recent) CMP     Component Value Date/Time   NA 142 11/12/2023 0925   NA 137 03/27/2013 0545   K 4.7 11/12/2023 0925   K 3.9 03/27/2013 0545   CL 107 11/12/2023 0925   CL 103 03/27/2013 0545   CO2 27 11/12/2023 0925   CO2 27 03/27/2013 0545   GLUCOSE 153 (H) 11/12/2023 0925   GLUCOSE 122 (H) 03/27/2013 0545   BUN 29 (H) 11/12/2023 0925   BUN 10 03/27/2013 0545   CREATININE 1.10 11/12/2023 0925   CREATININE 1.08 (H) 06/19/2023 0922   CALCIUM 10.6 (H) 11/12/2023 0925   CALCIUM 8.9 03/27/2013 0545   PROT 6.7 11/12/2023 0925   PROT 7.6 10/07/2011 1316   ALBUMIN 4.4 11/12/2023 0925   ALBUMIN 4.5 10/07/2011 1316   AST 28 11/12/2023 0925   AST 26 10/07/2011 1316   ALT 22 11/12/2023 0925   ALT 26 10/07/2011 1316   ALKPHOS 80 11/12/2023 0925   ALKPHOS 46 (L) 10/07/2011 1316   BILITOT 0.6 11/12/2023 0925  BILITOT 0.5 10/07/2011 1316   GFRNONAA 47 (L) 07/04/2023 0722   GFRNONAA 54 (L) 03/03/2020 0000   GFRAA 63 03/03/2020 0000     Diabetic Labs (most recent): Lab Results  Component Value Date   HGBA1C 8.8 (A) 12/03/2023   HGBA1C 8.1 (A) 04/01/2023    HGBA1C 8.5 (A) 12/20/2022   MICROALBUR 30mg /L 04/01/2023   MICROALBUR 30 04/26/2021   MICROALBUR 1.9 03/03/2020     Lipid Panel ( most recent) Lipid Panel     Component Value Date/Time   CHOL 128 11/12/2023 0925   CHOL 158 12/12/2020 1115   CHOL 141 10/08/2011 0457   TRIG 141.0 11/12/2023 0925   TRIG 179 10/08/2011 0457   HDL 48.50 11/12/2023 0925   HDL 52 12/12/2020 1115   HDL 37 (L) 10/08/2011 0457   CHOLHDL 3 11/12/2023 0925   VLDL 28.2 11/12/2023 0925   VLDL 36 10/08/2011 0457   LDLCALC 51 11/12/2023 0925   LDLCALC 70 04/24/2021 1043   LDLCALC 68 10/08/2011 0457      Lab Results  Component Value Date   TSH 2.90 11/12/2023   TSH 1.78 11/13/2022   TSH 2.44 11/01/2021   TSH 1.94 06/07/2021   TSH 1.99 04/24/2021   TSH 1.66 03/03/2020   TSH 2.68 03/05/2019   TSH 2.96 11/06/2017   TSH 1.24 02/01/2017   TSH 1.86 02/06/2016   FREET4 1.4 04/24/2021   FREET4 1.4 03/03/2020      Assessment & Plan:   1) Type 2 diabetes complicated by stage 2 CKD  - Akila M Debella has currently controlled asymptomatic type 2 DM since  88 years of age.  She presents today with her meter and logs showing slightly above target fasting glycemic profile.  Her POCT A1c today is 8.8%, increasing from last visit of 8.1%.  She notes she broke her femur since last visit and has really had a hard time recovering.  She does have symptoms of hypoglycemia occasionally.  -recent labs reviewed.   - I had a long discussion with her about the progressive nature of diabetes and the pathology behind its complications. -her diabetes is complicated by stage 2 CKD and she remains at a high risk for more acute and chronic complications which include CAD, CVA, CKD, retinopathy, and neuropathy. These are all discussed in detail with her.  The following Lifestyle Medicine recommendations according to American College of Lifestyle Medicine [facility]) were discussed and offered to patient and she agrees to start  the journey:  A. Whole Foods, Plant-based plate comprising of fruits and vegetables, plant-based proteins, whole-grain carbohydrates was discussed in detail with the patient.   A list for source of those nutrients were also provided to the patient.  Patient will use only water or unsweetened tea for hydration. B.  The need to stay away from risky substances including alcohol, smoking; obtaining 7 to 9 hours of restorative sleep, at least 150 minutes of moderate intensity exercise weekly, the importance of healthy social connections,  and stress reduction techniques were discussed. C.  A full color page of  Calorie density of various food groups per pound showing examples of each food groups was provided to the patient.  - Nutritional counseling repeated at each appointment due to patients tendency to fall back in to old habits.  - The patient admits there is a room for improvement in their diet and drink choices. -  Suggestion is made for the patient to avoid simple carbohydrates from their diet including Cakes,  Sweet Desserts / Pastries, Ice Cream, Soda (diet and regular), Sweet Tea, Candies, Chips, Cookies, Sweet Pastries, Store Bought Juices, Alcohol in Excess of 1-2 drinks a day, Artificial Sweeteners, Coffee Creamer, and "Sugar-free" Products. This will help patient to have stable blood glucose profile and potentially avoid unintended weight gain.   - I encouraged the patient to switch to unprocessed or minimally processed complex starch and increased protein intake (animal or plant source), fruits, and vegetables.   - Patient is advised to stick to a routine mealtimes to eat 3 meals a day and avoid unnecessary snacks (to snack only to correct hypoglycemia).  - I have approached her with the following individualized plan to manage  her diabetes and patient agrees:   -She is advised to continue Metformin 500 mg ER daily with breakfast and stop her Glipizide for now ( I fear this is causing sudden  drops in glucose resulting in symptoms and liver dumping to compensate).  I am having her check glucose twice daily for the next 2 weeks and send readings to reassess.   -She is encouraged to continue monitoring blood glucose at least once daily, before breakfast and call the clinic if she gets readings less than 70 or greater than 200 for 3 tests in a row.  - she is not a candidate for SGLT2 inhibitors or full dose Metformin due to concurrent renal insufficiency.  - she is not a candidate for incretin therapy due to her body habitus.    She could not afford copay for Januvia, or Jardiance.  - Patient specific target  A1c;  LDL, HDL, Triglycerides, and  Waist Circumference were discussed in detail.  2) Blood Pressure /Hypertension:  Her blood pressure is controlled to target.  She is advised to continue Coreg 3.125 mg po twice daily and Lisinopril 10 mg po daily.   3) Lipids/Hyperlipidemia: Her most recent lipid panel from 11/12/23 shows controlled LDL at 51.  She is advised to continue Crestor 40 mg po daily at bedtime.  Side effects and precautions discussed with her.    4)  Weight/Diet: Her Body mass index is 22.97 kg/m.  -She is not a candidate for weight loss.  Exercise, and detailed carbohydrates information provided  -  detailed on discharge instructions.  5) Chronic Care/Health Maintenance: -she is on ACEI/ARB and Statin medications and  is encouraged to initiate and continue to follow up with Ophthalmology, Dentist,  Podiatrist at least yearly or according to recommendations, and advised to   stay away from smoking. I have recommended yearly flu vaccine and pneumonia vaccine at least every 5 years; moderate intensity exercise for up to 150 minutes weekly; and  sleep for at least 7 hours a day.  - she is advised to maintain close follow up with Tower, Audrie Gallus, MD for primary care needs, as well as her other providers for optimal and coordinated care.     I spent  43  minutes in  the care of the patient today including review of labs from CMP, Lipids, Thyroid Function, Hematology (current and previous including abstractions from other facilities); face-to-face time discussing  her blood glucose readings/logs, discussing hypoglycemia and hyperglycemia episodes and symptoms, medications doses, her options of short and long term treatment based on the latest standards of care / guidelines;  discussion about incorporating lifestyle medicine;  and documenting the encounter. Risk reduction counseling performed per USPSTF guidelines to reduce obesity and cardiovascular risk factors.     Please refer to Patient Instructions  for Blood Glucose Monitoring and Insulin/Medications Dosing Guide"  in media tab for additional information. Please  also refer to " Patient Self Inventory" in the Media  tab for reviewed elements of pertinent patient history.  Sharyn Lull Mancia participated in the discussions, expressed understanding, and voiced agreement with the above plans.  All questions were answered to her satisfaction. she is encouraged to contact clinic should she have any questions or concerns prior to her return visit.    Follow up plan: - Return in about 3 months (around 03/04/2024) for Diabetes F/U with A1c in office, No previsit labs, Bring meter and logs.  Ronny Bacon, [facility] [address] [place], [place] [address] Phone: [phone] Fax: [phone]  12/03/2023, 4:29 PM

## 2023-12-09 ENCOUNTER — Other Ambulatory Visit: Payer: Self-pay | Admitting: Nurse Practitioner

## 2023-12-09 ENCOUNTER — Telehealth: Payer: Self-pay

## 2023-12-09 DIAGNOSIS — E1122 Type 2 diabetes mellitus with diabetic chronic kidney disease: Secondary | ICD-10-CM

## 2023-12-09 MED ORDER — GLIPIZIDE ER 5 MG PO TB24
5.0000 mg | ORAL_TABLET | Freq: Every day | ORAL | 1 refills | Status: DC
Start: 2023-12-09 — End: 2024-03-12

## 2023-12-09 MED ORDER — ONETOUCH DELICA LANCETS 33G MISC: 2 refills | Status: AC

## 2023-12-09 NOTE — Telephone Encounter (Signed)
 I will send in a prescription for her Glipizide for her.

## 2023-12-09 NOTE — Telephone Encounter (Signed)
 Spoke with pt and discussed recommended options. Pt stated she would like to try restarting Glipizide.

## 2023-12-09 NOTE — Telephone Encounter (Signed)
 Evening readings are far too high.  We have a couple of options.  We can re-initiate Glipizide or we can add once daily injections of insulin (which has the ability to be adjusted a bit easier to gain control).  I fear we have to at lease consider basal insulin in her case as what we have done so far has not been working recently.

## 2023-12-09 NOTE — Telephone Encounter (Signed)
 Noted. Pt made aware. Requested Rx refill for One Touch lancets. Rx for One Touch lancets to test BG bid as instructed sent to [facility].

## 2023-12-09 NOTE — Telephone Encounter (Signed)
 Pt called with high BG readings.   Date Before breakfast Before lunch Before supper Bedtime  12/06/23 166   258  12/07/23 175   218  12/08/23 191   307  12/09/23 206       Pt taking: Metformin XR 500mg  daily

## 2023-12-10 ENCOUNTER — Telehealth: Payer: Self-pay | Admitting: *Deleted

## 2023-12-10 NOTE — Telephone Encounter (Signed)
 Ms. Tecson left a message that she needed to speak with Whitney only. This is in reference to a medication. Her call back is [phone].

## 2023-12-10 NOTE — Telephone Encounter (Signed)
 She was confused as to if she were to take the 5 mg of Glipizide or 2.5 mg of Glipizide.  I told her I wanted her to take 5 mg of Glipizide for now and reach out with readings in 1 month.  We again, talked about basal insulin if this does not help glucose readings.

## 2023-12-10 NOTE — Telephone Encounter (Signed)
 Noted.

## 2023-12-27 ENCOUNTER — Ambulatory Visit (INDEPENDENT_AMBULATORY_CARE_PROVIDER_SITE_OTHER): Payer: BC Managed Care – PPO

## 2023-12-27 ENCOUNTER — Ambulatory Visit (INDEPENDENT_AMBULATORY_CARE_PROVIDER_SITE_OTHER): Payer: BC Managed Care – PPO | Admitting: Vascular Surgery

## 2023-12-27 DIAGNOSIS — I739 Peripheral vascular disease, unspecified: Secondary | ICD-10-CM

## 2023-12-27 DIAGNOSIS — I701 Atherosclerosis of renal artery: Secondary | ICD-10-CM | POA: Diagnosis not present

## 2023-12-30 LAB — VAS US ABI WITH/WO TBI
Left ABI: 1.03
Right ABI: 1.04

## 2024-01-06 DIAGNOSIS — S72452D Displaced supracondylar fracture without intracondylar extension of lower end of left femur, subsequent encounter for closed fracture with routine healing: Secondary | ICD-10-CM | POA: Diagnosis not present

## 2024-01-07 ENCOUNTER — Other Ambulatory Visit: Payer: Self-pay | Admitting: Nurse Practitioner

## 2024-01-10 ENCOUNTER — Ambulatory Visit (INDEPENDENT_AMBULATORY_CARE_PROVIDER_SITE_OTHER): Admitting: Vascular Surgery

## 2024-01-10 ENCOUNTER — Encounter (INDEPENDENT_AMBULATORY_CARE_PROVIDER_SITE_OTHER): Payer: Self-pay | Admitting: Vascular Surgery

## 2024-01-10 VITALS — BP 154/69 | HR 54 | Resp 18 | Ht 62.0 in | Wt 125.2 lb

## 2024-01-10 DIAGNOSIS — I10 Essential (primary) hypertension: Secondary | ICD-10-CM | POA: Diagnosis not present

## 2024-01-10 DIAGNOSIS — E1122 Type 2 diabetes mellitus with diabetic chronic kidney disease: Secondary | ICD-10-CM | POA: Diagnosis not present

## 2024-01-10 DIAGNOSIS — I739 Peripheral vascular disease, unspecified: Secondary | ICD-10-CM | POA: Diagnosis not present

## 2024-01-10 DIAGNOSIS — N182 Chronic kidney disease, stage 2 (mild): Secondary | ICD-10-CM

## 2024-01-10 DIAGNOSIS — I701 Atherosclerosis of renal artery: Secondary | ICD-10-CM

## 2024-01-10 NOTE — Assessment & Plan Note (Signed)
 Her right ABI was 1.04 and her left ABI was 1.03 with multiphasic waveforms.  Doing well after extensive revascularization a few years ago.  Continue Plavix  and statin agent.  Recheck in 1 year.

## 2024-01-10 NOTE — Assessment & Plan Note (Signed)
 A renal artery duplex done last week showed a patent renal artery stent but no current hemodynamically significant stenosis in either renal artery.  Continue Crestor  and Plavix .  Follow-up in 1 year.

## 2024-01-10 NOTE — Progress Notes (Signed)
 MRN : [id number]  Tracey [name] is a 88 y.o. ([birth year]) female who presents with chief complaint of  Chief Complaint  Patient presents with   Follow-up    24yr renal/abi.  Aaron Aas  History of Present Illness: Patient returns today in follow up of multiple vascular issues.  Her biggest issue currently is of left leg pain and it sounds like she is going to require a significant orthopedic surgery in the near future.  She had a femur fracture last year and is still having issues from that.  Her blood pressure has been under reasonably good control although it is a little high today.  A renal artery duplex done last week showed a patent renal artery stent but no current hemodynamically significant stenosis in either renal artery. She is also followed for peripheral arterial disease.  At the time of her renal artery stent, she also had aortoiliac intervention.  This has been several years ago.  Her leg pain currently is orthopedic in nature and she does not have any lifestyle limiting claudication, ischemic rest pain, or ulceration.  Her right ABI was 1.04 and her left ABI was 1.03 with multiphasic waveforms.  Current Outpatient Medications  Medication Sig Dispense Refill   carvedilol  (COREG ) 3.125 MG tablet TAKE ONE TABLET BY MOUTH ONCE DAILY 90 tablet 3   Cholecalciferol (VITAMIN D3) 1.25 MG (50000 UT) CAPS Take by mouth daily.     clopidogrel  (PLAVIX ) 75 MG tablet TAKE ONE TABLET BY MOUTH ONCE DAILY 90 tablet 2   CRANBERRY PO Take 1 tablet by mouth daily.     cyanocobalamin  1000 MCG tablet Take 1 tablet (1,000 mcg total) by mouth daily.     ezetimibe  (ZETIA ) 10 MG tablet TAKE ONE TABLET BY MOUTH ONCE DAILY 90 tablet 3   glipiZIDE  (GLUCOTROL  XL) 5 MG 24 hr tablet Take 1 tablet (5 mg total) by mouth daily with breakfast. 90 tablet 1   glucose blood (FREESTYLE LITE) test strip check blood sugar TWICE DAILY 100 each 12   lisinopril  (ZESTRIL ) 10 MG tablet TAKE ONE TABLET BY MOUTH ONCE DAILY 90 tablet  3   metFORMIN  (GLUCOPHAGE -XR) 500 MG 24 hr tablet Take 1 tablet (500 mg total) by mouth daily. 90 tablet 3   Multiple Vitamins-Minerals (MULTIVITAMIN WITH MINERALS) tablet Take 1 tablet by mouth daily.     nitroGLYCERIN  (NITROSTAT ) 0.4 MG SL tablet Place 1 tablet (0.4 mg total) under the tongue every 5 (five) minutes as needed for chest pain. 25 tablet 3   OneTouch Delica Lancets 33G MISC USE TO TEST TWICE A DAY 100 each 2   pantoprazole  (PROTONIX ) 40 MG tablet TAKE ONE TABLET BY MOUTH EVERY OTHER DAY 45 tablet 0   polyethylene glycol (MIRALAX / GLYCOLAX) 17 g packet Take 17 g by mouth daily. 1/2 capful as needed     rosuvastatin  (CRESTOR ) 40 MG tablet TAKE ONE TABLET BY MOUTH ONCE DAILY 90 tablet 3   ondansetron  (ZOFRAN -ODT) 4 MG disintegrating tablet Take 1-2 tablets by mouth every 8 hrs as needed for nausea (Patient not taking: Reported on 12/03/2023) 30 tablet 0   senna-docusate (SENOKOT-S) 8.6-50 MG tablet Take 2 tablets by mouth 2 (two) times daily between meals as needed for mild constipation. (Patient not taking: Reported on 12/03/2023)     No current facility-administered medications for this visit.    Past Medical History:  Diagnosis Date   Anxiety    Arthritis    Atrial fibrillation (HCC)    history  Chronic kidney disease    ckd stage II   Coronary artery disease    Diabetes mellitus    non insulin  dependent   Dysrhythmia    atrial fibrillation, history of   GERD (gastroesophageal reflux disease)    Heart murmur    History of kidney stones    Hyperlipidemia    Hypertension    Peripheral vascular disease (HCC)    Rectocele    Syncope 09/2019   UTI (urinary tract infection)     Past Surgical History:  Procedure Laterality Date   ABDOMINAL AORTA STENT     APPENDECTOMY     BREAST BIOPSY Left    20 years ago   BREAST BIOPSY Right 2008   BREAST EXCISIONAL BIOPSY     rt 2008 lt "20 years ago"   CARDIAC CATHETERIZATION     CATARACT EXTRACTION W/PHACO Left 08/15/2015    Procedure: CATARACT EXTRACTION PHACO AND INTRAOCULAR LENS PLACEMENT (IOC);  Surgeon: Steven Dingeldein, MD;  Location: [facility] ORS;  Service: Ophthalmology;  Laterality: Left;  US  01:28 AP% 26.4 CDE 35.73 fluid pack # [id number]   COLONOSCOPY WITH PROPOFOL  N/A 01/16/2016   Procedure: COLONOSCOPY WITH PROPOFOL ;  Surgeon: Cassie Click, MD;  Location: [facility] ENDOSCOPY;  Service: Endoscopy;  Laterality: N/A;   CORONARY ANGIOPLASTY WITH STENT PLACEMENT     CORONARY ARTERY BYPASS GRAFT  1995   with LIMA  to the LAD, SVG to OM1, SVG to PDA   ESOPHAGOGASTRODUODENOSCOPY (EGD) WITH PROPOFOL  N/A 01/16/2016   Procedure: ESOPHAGOGASTRODUODENOSCOPY (EGD) WITH PROPOFOL ;  Surgeon: Cassie Click, MD;  Location: [facility] ENDOSCOPY;  Service: Endoscopy;  Laterality: N/A;   EYE SURGERY     JOINT REPLACEMENT Right    KNEE ARTHROPLASTY Left 01/06/2020   Procedure: COMPUTER ASSISTED TOTAL KNEE ARTHROPLASTY;  Surgeon: Arlyne Lame, MD;  Location: [facility] ORS;  Service: Orthopedics;  Laterality: Left;   LOWER EXTREMITY ANGIOGRAPHY N/A 07/08/2019   Procedure: Abdominal Aortagraphy;  Surgeon: Celso College, MD;  Location: [facility] INVASIVE CV LAB;  Service: Cardiovascular;  Laterality: N/A;   ORIF FEMUR FRACTURE Left 07/01/2023   Procedure: OPEN REDUCTION INTERNAL FIXATION (ORIF) DISTAL FEMUR FRACTURE;  Surgeon: Laneta Pintos, MD;  Location: [facility] OR;  Service: Orthopedics;  Laterality: Left;   RENAL ANGIOGRAPHY Left 08/26/2019   Procedure: RENAL ANGIOGRAPHY;  Surgeon: Celso College, MD;  Location: [facility] INVASIVE CV LAB;  Service: Cardiovascular;  Laterality: Left;   RENAL ARTERY STENT     left, By Dr Jerre Moots   REPLACEMENT TOTAL KNEE  03/25/2013   right   TONSILLECTOMY       Social History   Tobacco Use   Smoking status: Never   Smokeless tobacco: Never  Vaping Use   Vaping status: Never Used  Substance Use Topics   Alcohol use: No   Drug use: No      Family History  Problem Relation Age of Onset   Heart disease  Father    Other Brother        open heart surgery   Cancer Brother    Other Brother        open heart surgery   Other Brother        open heart surgery   Cancer Daughter    Hyperlipidemia Daughter    Breast cancer Neg Hx    Kidney cancer Neg Hx    Bladder Cancer Neg Hx      Allergies  Allergen Reactions   Ferrous Sulfate  Anaphylaxis and Rash  Codeine Nausea Only    Sick to her stomach   Erythromycin Nausea And Vomiting and Other (See Comments)   Tramadol  Nausea Only and Other (See Comments)    Loss of appetite and did not eat   Ciprofloxacin Diarrhea    Upsets stomach     REVIEW OF SYSTEMS (Negative unless checked)   Constitutional: [] Weight loss  [] Fever  [] Chills Cardiac: [] Chest pain   [] Chest pressure   [x] Palpitations   [] Shortness of breath when laying flat   [] Shortness of breath at rest   [] Shortness of breath with exertion. Vascular:  [] Pain in legs with walking   [] Pain in legs at rest   [] Pain in legs when laying flat   [] Claudication   [] Pain in feet when walking  [] Pain in feet at rest  [] Pain in feet when laying flat   [] History of DVT   [] Phlebitis   [] Swelling in legs   [] Varicose veins   [] Non-healing ulcers Pulmonary:   [] Uses home oxygen   [] Productive cough   [] Hemoptysis   [] Wheeze  [] COPD   [] Asthma Neurologic:  [] Dizziness  [] Blackouts   [] Seizures   [] History of stroke   [] History of TIA  [] Aphasia   [] Temporary blindness   [] Dysphagia   [] Weakness or numbness in arms   [] Weakness or numbness in legs Musculoskeletal:  [x] Arthritis   [] Joint swelling   [] Joint pain   [] Low back pain Hematologic:  [] Easy bruising  [] Easy bleeding   [] Hypercoagulable state   [x] Anemic   Gastrointestinal:  [] Blood in stool   [] Vomiting blood  [] Gastroesophageal reflux/heartburn   [] Abdominal pain Genitourinary:  [] Chronic kidney disease   [] Difficult urination  [] Frequent urination  [] Burning with urination   [] Hematuria Skin:  [] Rashes   [] Ulcers    [] Wounds Psychological:  [x] History of anxiety   []  History of major depression.  Physical Examination  BP (!) 154/69   Pulse (!) 54   Resp 18   Ht 5\' 2"  (1.575 m)   Wt 125 lb 3.2 oz (56.8 kg)   BMI 22.90 kg/m  Gen:  WD/WN, NAD. Appears younger than stated age. Head: [place]/AT, No temporalis wasting. Ear/Nose/Throat: Hearing grossly intact, nares w/o erythema or drainage Eyes: Conjunctiva clear. Sclera non-icteric Neck: Supple.  Trachea midline Pulmonary:  Good air movement, no use of accessory muscles.  Cardiac: bradycardic Vascular:  Vessel Right Left  Radial Palpable Palpable                          PT Palpable Palpable  DP Palpable Palpable   Gastrointestinal: soft, non-tender/non-distended. No guarding/reflex.  Musculoskeletal: M/S 5/5 throughout.  No deformity or atrophy. No edema. Neurologic: Sensation grossly intact in extremities.  Symmetrical.  Speech is fluent.  Psychiatric: Judgment intact, Mood & affect appropriate for pt's clinical situation. Dermatologic: No rashes or ulcers noted.  No cellulitis or open wounds.      Labs Recent Results (from the past 2160 hours)  Vitamin B12     Status: Abnormal   Collection Time: 11/12/23  9:25 AM  Result Value Ref Range   Vitamin B-12 1,321 (H) 211 - 911 pg/mL  VITAMIN D  25 Hydroxy (Vit-D Deficiency, Fractures)     Status: None   Collection Time: 11/12/23  9:25 AM  Result Value Ref Range   VITD 46.90 30.00 - 100.00 ng/mL  CBC with Differential/Platelet     Status: Abnormal   Collection Time: 11/12/23  9:25 AM  Result Value Ref Range  WBC 4.4 4.0 - 10.5 K/uL   RBC 3.94 3.87 - 5.11 Mil/uL   Hemoglobin 11.8 (L) 12.0 - 15.0 g/dL   HCT 16.1 (L) 09.6 - 04.5 %   MCV 90.1 78.0 - 100.0 fl   MCHC 33.3 30.0 - 36.0 g/dL   RDW 40.9 81.1 - 91.4 %   Platelets 140.0 (L) 150.0 - 400.0 K/uL   Neutrophils Relative % 55.1 43.0 - 77.0 %   Lymphocytes Relative 33.2 12.0 - 46.0 %   Monocytes Relative 9.1 3.0 - 12.0 %    Eosinophils Relative 2.1 0.0 - 5.0 %   Basophils Relative 0.5 0.0 - 3.0 %   Neutro Abs 2.4 1.4 - 7.7 K/uL   Lymphs Abs 1.5 0.7 - 4.0 K/uL   Monocytes Absolute 0.4 0.1 - 1.0 K/uL   Eosinophils Absolute 0.1 0.0 - 0.7 K/uL   Basophils Absolute 0.0 0.0 - 0.1 K/uL  Comprehensive metabolic panel     Status: Abnormal   Collection Time: 11/12/23  9:25 AM  Result Value Ref Range   Sodium 142 135 - 145 mEq/L   Potassium 4.7 3.5 - 5.1 mEq/L   Chloride 107 96 - 112 mEq/L   CO2 27 19 - 32 mEq/L   Glucose, Bld 153 (H) 70 - 99 mg/dL   BUN 29 (H) 6 - 23 mg/dL   Creatinine, Ser 7.82 0.40 - 1.20 mg/dL   Total Bilirubin 0.6 0.2 - 1.2 mg/dL   Alkaline Phosphatase 80 39 - 117 U/L   AST 28 0 - 37 U/L   ALT 22 0 - 35 U/L   Total Protein 6.7 6.0 - 8.3 g/dL   Albumin 4.4 3.5 - 5.2 g/dL   GFR 95.62 (L) >13.08 mL/min    Comment: Calculated using the CKD-EPI Creatinine Equation (2021)   Calcium  10.6 (H) 8.4 - 10.5 mg/dL  Lipid panel     Status: None   Collection Time: 11/12/23  9:25 AM  Result Value Ref Range   Cholesterol 128 0 - 200 mg/dL    Comment: ATP III Classification       Desirable:  < 200 mg/dL               Borderline High:  200 - 239 mg/dL          High:  > = 657 mg/dL   Triglycerides 846.9 0.0 - 149.0 mg/dL    Comment: Normal:  <629 mg/dLBorderline High:  150 - 199 mg/dL   HDL 52.84 >13.24 mg/dL   VLDL 40.1 0.0 - 02.7 mg/dL   LDL Cholesterol 51 0 - 99 mg/dL   Total CHOL/HDL Ratio 3     Comment:                Men          Women1/2 Average Risk     3.4          3.3Average Risk          5.0          4.42X Average Risk          9.6          7.13X Average Risk          15.0          11.0                       NonHDL 79.38     Comment: NOTE:  Non-HDL goal should  be 30 mg/dL higher than patient's LDL goal (i.e. LDL goal of < 70 mg/dL, would have non-HDL goal of < 100 mg/dL)  TSH     Status: None   Collection Time: 11/12/23  9:25 AM  Result Value Ref Range   TSH 2.90 0.35 - 5.50 uIU/mL  HgB A1c      Status: Abnormal   Collection Time: 12/03/23  3:42 PM  Result Value Ref Range   Hemoglobin A1C 8.8 (A) 4.0 - 5.6 %   HbA1c POC (<> result, manual entry)     HbA1c, POC (prediabetic range)     HbA1c, POC (controlled diabetic range)    VAS US  ABI WITH/WO TBI     Status: None   Collection Time: 12/27/23  8:49 AM  Result Value Ref Range   Right ABI 1.04    Left ABI 1.03     Radiology VAS US  RENAL ARTERY DUPLEX Result Date: 01/01/2024 ABDOMINAL VISCERAL Patient Name:  Tracey [name]  Date of Exam:   12/27/2023 Medical Rec #: [id number]        Accession #:    [id number] Date of Birth: [birth year]        Patient Gender: F Patient Age:   50 years Exam Location:  [facility] Procedure:      VAS US  RENAL ARTERY DUPLEX Referring Phys: [id number] Donald Frost Jarelis Ehlert -------------------------------------------------------------------------------- Indications: RAS High Risk Factors: Hyperlipidemia. Vascular Interventions: 08/26/2019: Left renal artery PTA/stent with pararenal. Comparison Study: 12/28/2022 Performing Technologist: Tonie Franks RVS  Examination Guidelines: A complete evaluation includes B-mode imaging, spectral Doppler, color Doppler, and power Doppler as needed of all accessible portions of each vessel. Bilateral testing is considered an integral part of a complete examination. Limited examinations for reoccurring indications may be performed as noted.  Duplex Findings: +------------+--------+--------+------+--------+ Mesenteric  PSV cm/sEDV cm/sPlaqueComments +------------+--------+--------+------+--------+ Aorta Prox    132      26                  +------------+--------+--------+------+--------+ Aorta Mid     168      0                   +------------+--------+--------+------+--------+ Aorta Distal  164      0                   +------------+--------+--------+------+--------+    +------------------+--------+--------+-------+ Right Renal ArteryPSV cm/sEDV  cm/sComment +------------------+--------+--------+-------+ Proximal             96      17           +------------------+--------+--------+-------+ Mid                 110      19           +------------------+--------+--------+-------+ Distal              109      21           +------------------+--------+--------+-------+ +-----------------+--------+--------+-------+ Left Renal ArteryPSV cm/sEDV cm/sComment +-----------------+--------+--------+-------+ Proximal           102      14           +-----------------+--------+--------+-------+ Mid                 81      16           +-----------------+--------+--------+-------+ Distal  94      14           +-----------------+--------+--------+-------+ +------------+--------+--------+--+-----------+--------+--------+---+ Right KidneyPSV cm/sEDV cm/sRILeft KidneyPSV cm/sEDV cm/sRI  +------------+--------+--------+--+-----------+--------+--------+---+ Upper Pole                    Upper Pole                     +------------+--------+--------+--+-----------+--------+--------+---+ Mid                           Mid                            +------------+--------+--------+--+-----------+--------+--------+---+ Lower Pole                    Lower Pole                     +------------+--------+--------+--+-----------+--------+--------+---+ Hilar                         Hilar                          +------------+--------+--------+--+-----------+--------+--------+---+ +------------------+-------+------------------+-------+ Right Kidney             Left Kidney               +------------------+-------+------------------+-------+ RAR                      RAR                       +------------------+-------+------------------+-------+ RAR (manual)             RAR (manual)              +------------------+-------+------------------+-------+ Cortex                    Cortex                    +------------------+-------+------------------+-------+ Cortex thickness  0.83 mmCorex thickness   0.66 mm +------------------+-------+------------------+-------+ Kidney length (cm)10.04  Kidney length (cm)10.01   +------------------+-------+------------------+-------+  Summary: Largest Aortic Diameter: 1.4 cm  Renal:  Right: Normal size right kidney.        1-59% stenosis of the right renal artery. RRV flow present.        Normal right Resisitive Index. Left:  Normal size of left kidney. 1-59% stenosis of the left renal        artery. LRV flow present. Normal left Resistive Index.  *See table(s) above for measurements and observations.  Diagnosing physician: Mikki Alexander MD  Electronically signed by Mikki Alexander MD on 01/01/2024 at 10:25:14 AM.    Final    VAS US  ABI WITH/WO TBI Result Date: 12/30/2023  LOWER EXTREMITY DOPPLER STUDY Patient Name:  Tracey [name]  Date of Exam:   12/27/2023 Medical Rec #: [id number]        Accession #:    [id number] Date of Birth: 1935-12-14        Patient Gender: F Patient Age:   89 years Exam Location:  [facility] Procedure:      VAS US  ABI WITH/WO TBI Referring Phys: Mikki Alexander --------------------------------------------------------------------------------  Indications: Peripheral artery disease. High Risk Factors: Hypertension, coronary artery disease.  Vascular Interventions: 08/26/2019:  Left renal artery PTA/stent with pararenal. Comparison Study: 12/28/2022 Performing Technologist: Tonie Franks RVS  Examination Guidelines: A complete evaluation includes at minimum, Doppler waveform signals and systolic blood pressure reading at the level of bilateral brachial, anterior tibial, and posterior tibial arteries, when vessel segments are accessible. Bilateral testing is considered an integral part of a complete examination. Photoelectric Plethysmograph (PPG) waveforms and toe systolic pressure readings are included as required and  additional duplex testing as needed. Limited examinations for reoccurring indications may be performed as noted.  ABI Findings: +---------+------------------+-----+---------+--------+ Right    Rt Pressure (mmHg)IndexWaveform Comment  +---------+------------------+-----+---------+--------+ Brachial 136                                      +---------+------------------+-----+---------+--------+ ATA      141               1.04 triphasic         +---------+------------------+-----+---------+--------+ PTA      142               1.04 triphasic         +---------+------------------+-----+---------+--------+ Great Toe106               0.78 Normal            +---------+------------------+-----+---------+--------+ +---------+------------------+-----+---------+-------+ Left     Lt Pressure (mmHg)IndexWaveform Comment +---------+------------------+-----+---------+-------+ Brachial 130                                     +---------+------------------+-----+---------+-------+ ATA      140               1.03 biphasic         +---------+------------------+-----+---------+-------+ PTA      140               1.03 triphasic        +---------+------------------+-----+---------+-------+ Great Toe129               0.95 Normal           +---------+------------------+-----+---------+-------+ +-------+-----------+-----------+------------+------------+ ABI/TBIToday's ABIToday's TBIPrevious ABIPrevious TBI +-------+-----------+-----------+------------+------------+ Right  1.04       .78        1.05        .71          +-------+-----------+-----------+------------+------------+ Left   1.03       .95        1.04        .71          +-------+-----------+-----------+------------+------------+  Bilateral ABIs appear essentially unchanged compared to prior study on 12/28/2022. Bilateral TBIs appear increased compared to prior study on 12/28/2022.  Summary: Right: Resting  right ankle-brachial index is within normal range. The right toe-brachial index is normal. Left: Resting left ankle-brachial index is within normal range. The left toe-brachial index is normal. *See table(s) above for measurements and observations.  Electronically signed by Mikki Alexander MD on 12/30/2023 at 8:53:05 AM.    Final     Assessment/Plan  Renal artery stenosis (HCC) A renal artery duplex done last week showed a patent renal artery stent but no current hemodynamically significant stenosis in either renal artery.  Continue Crestor  and Plavix .  Follow-up in 1 year.  PAD (peripheral artery disease) (HCC) Her right ABI was 1.04 and her left ABI was 1.03 with multiphasic waveforms.  Doing well after extensive revascularization a few years ago.  Continue Plavix  and statin agent.  Recheck in 1 year.  Essential hypertension, benign blood pressure control important in reducing the progression of atherosclerotic disease. On appropriate oral medications.  Blood pressure much better after renal intervention.     Type 2 diabetes mellitus with stage 2 chronic kidney disease, without long-term current use of insulin  (HCC) blood glucose control important in reducing the progression of atherosclerotic disease. Also, involved in wound healing. On appropriate medications.  Mikki Alexander, MD  01/10/2024 10:39 AM    This note was created with Dragon medical transcription system.  Any errors from dictation are purely unintentional

## 2024-01-29 ENCOUNTER — Other Ambulatory Visit: Payer: Self-pay | Admitting: Nurse Practitioner

## 2024-01-29 DIAGNOSIS — E1122 Type 2 diabetes mellitus with diabetic chronic kidney disease: Secondary | ICD-10-CM

## 2024-02-04 ENCOUNTER — Encounter (INDEPENDENT_AMBULATORY_CARE_PROVIDER_SITE_OTHER): Payer: Self-pay

## 2024-02-06 ENCOUNTER — Other Ambulatory Visit: Payer: Self-pay | Admitting: Family Medicine

## 2024-02-25 ENCOUNTER — Other Ambulatory Visit: Payer: Self-pay | Admitting: Student

## 2024-02-25 DIAGNOSIS — S72452D Displaced supracondylar fracture without intracondylar extension of lower end of left femur, subsequent encounter for closed fracture with routine healing: Secondary | ICD-10-CM

## 2024-03-07 ENCOUNTER — Ambulatory Visit (HOSPITAL_COMMUNITY)
Admission: RE | Admit: 2024-03-07 | Discharge: 2024-03-07 | Disposition: A | Discharge status: Home / Self Care | Source: Ambulatory Visit | Attending: Student | Admitting: Student

## 2024-03-07 DIAGNOSIS — S72452D Displaced supracondylar fracture without intracondylar extension of lower end of left femur, subsequent encounter for closed fracture with routine healing: Secondary | ICD-10-CM | POA: Diagnosis present

## 2024-03-07 DIAGNOSIS — K5730 Diverticulosis of large intestine without perforation or abscess without bleeding: Secondary | ICD-10-CM | POA: Diagnosis not present

## 2024-03-07 DIAGNOSIS — Z96652 Presence of left artificial knee joint: Secondary | ICD-10-CM | POA: Diagnosis not present

## 2024-03-07 DIAGNOSIS — M1612 Unilateral primary osteoarthritis, left hip: Secondary | ICD-10-CM | POA: Diagnosis not present

## 2024-03-12 ENCOUNTER — Encounter: Payer: Self-pay | Admitting: Nurse Practitioner

## 2024-03-12 ENCOUNTER — Ambulatory Visit (INDEPENDENT_AMBULATORY_CARE_PROVIDER_SITE_OTHER): Admitting: Nurse Practitioner

## 2024-03-12 VITALS — BP 106/78 | HR 64 | Ht 62.0 in | Wt 125.4 lb

## 2024-03-12 DIAGNOSIS — N182 Chronic kidney disease, stage 2 (mild): Secondary | ICD-10-CM

## 2024-03-12 DIAGNOSIS — E782 Mixed hyperlipidemia: Secondary | ICD-10-CM | POA: Diagnosis not present

## 2024-03-12 DIAGNOSIS — E1122 Type 2 diabetes mellitus with diabetic chronic kidney disease: Secondary | ICD-10-CM

## 2024-03-12 DIAGNOSIS — Z7984 Long term (current) use of oral hypoglycemic drugs: Secondary | ICD-10-CM

## 2024-03-12 DIAGNOSIS — E559 Vitamin D deficiency, unspecified: Secondary | ICD-10-CM

## 2024-03-12 DIAGNOSIS — I10 Essential (primary) hypertension: Secondary | ICD-10-CM | POA: Diagnosis not present

## 2024-03-12 LAB — POCT UA - MICROALBUMIN
Albumin/Creatinine Ratio, Urine, POC: <30
Creatinine, POC: 100 mg/dL
Microalbumin Ur, POC: 80 mg/L

## 2024-03-12 LAB — POCT GLYCOSYLATED HEMOGLOBIN (HGB A1C): Hemoglobin A1C: 8.7 % — AB (ref 4.0–5.6)

## 2024-03-12 MED ORDER — GLIPIZIDE ER 5 MG PO TB24: 5.0000 mg | ORAL_TABLET | Freq: Every day | ORAL | 3 refills | Status: AC

## 2024-03-12 NOTE — Progress Notes (Signed)
 03/12/2024, 10:13 AM  Endocrinology follow-up note   Subjective:    Patient ID: Tracey [name], female    DOB: August 27, 1936.  Tracey [name] is being seen  in follow-up after she was seen in consultation for management of currently uncontrolled symptomatic diabetes requested by  Tracey [name] LABOR, MD.   Past Medical History:  Diagnosis Date   Anxiety    Arthritis    Atrial fibrillation (HCC)    history   Chronic kidney disease    ckd stage II   Coronary artery disease    Diabetes mellitus    non insulin  dependent   Dysrhythmia    atrial fibrillation, history of   GERD (gastroesophageal reflux disease)    Heart murmur    History of kidney stones    Hyperlipidemia    Hypertension    Peripheral vascular disease (HCC)    Rectocele    Syncope 09/2019   UTI (urinary tract infection)     Past Surgical History:  Procedure Laterality Date   ABDOMINAL AORTA STENT     APPENDECTOMY     BREAST BIOPSY Left    20 years ago   BREAST BIOPSY Right 2008   BREAST EXCISIONAL BIOPSY     rt 2008 lt 20 years ago   CARDIAC CATHETERIZATION     CATARACT EXTRACTION W/PHACO Left 08/15/2015   Procedure: CATARACT EXTRACTION PHACO AND INTRAOCULAR LENS PLACEMENT (IOC);  Surgeon: Steven Dingeldein, MD;  Location: [facility] ORS;  Service: Ophthalmology;  Laterality: Left;  US  01:28 AP% 26.4 CDE 35.73 fluid pack # [id number]   COLONOSCOPY WITH PROPOFOL  N/A 01/16/2016   Procedure: COLONOSCOPY WITH PROPOFOL ;  Surgeon: Lamar ONEIDA Holmes, MD;  Location: [facility] ENDOSCOPY;  Service: Endoscopy;  Laterality: N/A;   CORONARY ANGIOPLASTY WITH STENT PLACEMENT     CORONARY ARTERY BYPASS GRAFT  1995   with LIMA  to the LAD, SVG to OM1, SVG to PDA   ESOPHAGOGASTRODUODENOSCOPY (EGD) WITH PROPOFOL  N/A 01/16/2016   Procedure: ESOPHAGOGASTRODUODENOSCOPY (EGD) WITH PROPOFOL ;  Surgeon: Lamar ONEIDA Holmes, MD;  Location: [facility] ENDOSCOPY;  Service:  Endoscopy;  Laterality: N/A;   EYE SURGERY     JOINT REPLACEMENT Right    KNEE ARTHROPLASTY Left 01/06/2020   Procedure: COMPUTER ASSISTED TOTAL KNEE ARTHROPLASTY;  Surgeon: Mardee Lynwood SQUIBB, MD;  Location: [facility] ORS;  Service: Orthopedics;  Laterality: Left;   LOWER EXTREMITY ANGIOGRAPHY N/A 07/08/2019   Procedure: Abdominal Aortagraphy;  Surgeon: Marea Selinda RAMAN, MD;  Location: [facility] INVASIVE CV LAB;  Service: Cardiovascular;  Laterality: N/A;   ORIF FEMUR FRACTURE Left 07/01/2023   Procedure: OPEN REDUCTION INTERNAL FIXATION (ORIF) DISTAL FEMUR FRACTURE;  Surgeon: Kendal Franky SQUIBB, MD;  Location: [facility] OR;  Service: Orthopedics;  Laterality: Left;   RENAL ANGIOGRAPHY Left 08/26/2019   Procedure: RENAL ANGIOGRAPHY;  Surgeon: Marea Selinda RAMAN, MD;  Location: [facility] INVASIVE CV LAB;  Service: Cardiovascular;  Laterality: Left;   RENAL ARTERY STENT     left, By Dr Primus   REPLACEMENT TOTAL KNEE  03/25/2013   right   TONSILLECTOMY      Social History   Socioeconomic History  Marital status: Married    Spouse name: Not on file   Number of children: Not on file   Years of education: Not on file   Highest education level: Not on file  Occupational History   Occupation: school cafeteria    Comment: retired  Tobacco Use   Smoking status: Never   Smokeless tobacco: Never  Vaping Use   Vaping status: Never Used  Substance and Sexual Activity   Alcohol use: No   Drug use: No   Sexual activity: Never  Other Topics Concern   Not on file  Social History Narrative   Patient has 3 daughters. Remarried June 2021.   Social Drivers of Corporate investment banker Strain: Low Risk  (08/23/2023)   Overall Financial Resource Strain (CARDIA)    Difficulty of Paying Living Expenses: Not hard at all  Food Insecurity: No Food Insecurity (08/23/2023)   Hunger Vital Sign    Worried About Running Out of Food in the Last Year: Never true    Ran Out of Food in the Last Year: Never true  Transportation Needs: No  Transportation Needs (08/23/2023)   PRAPARE - Administrator, Civil Service (Medical): No    Lack of Transportation (Non-Medical): No  Physical Activity: Insufficiently Active (08/23/2023)   Exercise Vital Sign    Days of Exercise per Week: 4 days    Minutes of Exercise per Session: 30 min  Stress: No Stress Concern Present (08/23/2023)   [employer] of Occupational Health - Occupational Stress Questionnaire    Feeling of Stress : Only a little  Social Connections: Socially Integrated (08/23/2023)   Social Connection and Isolation Panel    Frequency of Communication with Friends and Family: More than three times a week    Frequency of Social Gatherings with Friends and Family: More than three times a week    Attends Religious Services: More than 4 times per year    Active Member of [employer] or Organizations: Yes    Attends Engineer, structural: More than 4 times per year    Marital Status: Married    Family History  Problem Relation Age of Onset   Heart disease Father    Other Brother        open heart surgery   Cancer Brother    Other Brother        open heart surgery   Other Brother        open heart surgery   Cancer Daughter    Hyperlipidemia Daughter    Breast cancer Neg Hx    Kidney cancer Neg Hx    Bladder Cancer Neg Hx     Outpatient Encounter Medications as of 03/12/2024  Medication Sig   carvedilol  (COREG ) 3.125 MG tablet TAKE ONE TABLET BY MOUTH ONCE DAILY   Cholecalciferol (VITAMIN D3) 1.25 MG (50000 UT) CAPS Take by mouth daily.   clopidogrel  (PLAVIX ) 75 MG tablet TAKE ONE TABLET BY MOUTH ONCE DAILY   CRANBERRY PO Take 1 tablet by mouth daily.   cyanocobalamin  1000 MCG tablet Take 1 tablet (1,000 mcg total) by mouth daily.   ezetimibe  (ZETIA ) 10 MG tablet TAKE ONE TABLET BY MOUTH ONCE DAILY   glucose blood (FREESTYLE LITE) test strip check blood sugar TWICE DAILY   lisinopril  (ZESTRIL ) 10 MG tablet TAKE ONE TABLET BY MOUTH ONCE DAILY    metFORMIN  (GLUCOPHAGE -XR) 500 MG 24 hr tablet TAKE ONE TABLET BY MOUTH ONCE DAILY   Multiple Vitamins-Minerals (MULTIVITAMIN WITH  MINERALS) tablet Take 1 tablet by mouth daily.   nitroGLYCERIN  (NITROSTAT ) 0.4 MG SL tablet Place 1 tablet (0.4 mg total) under the tongue every 5 (five) minutes as needed for chest pain.   ondansetron  (ZOFRAN -ODT) 4 MG disintegrating tablet Take 1-2 tablets by mouth every 8 hrs as needed for nausea   OneTouch Delica Lancets 33G MISC USE TO TEST TWICE A DAY   pantoprazole  (PROTONIX ) 40 MG tablet TAKE ONE TABLET BY MOUTH EVERY OTHER DAY   polyethylene glycol (MIRALAX / GLYCOLAX) 17 g packet Take 17 g by mouth daily. 1/2 capful as needed   rosuvastatin  (CRESTOR ) 40 MG tablet TAKE ONE TABLET BY MOUTH ONCE DAILY   senna-docusate (SENOKOT-S) 8.6-50 MG tablet Take 2 tablets by mouth 2 (two) times daily between meals as needed for mild constipation.   [DISCONTINUED] glipiZIDE  (GLUCOTROL  XL) 5 MG 24 hr tablet Take 1 tablet (5 mg total) by mouth daily with breakfast.   glipiZIDE  (GLUCOTROL  XL) 5 MG 24 hr tablet Take 1 tablet (5 mg total) by mouth daily with breakfast.   No facility-administered encounter medications on file as of 03/12/2024.    ALLERGIES: Allergies  Allergen Reactions   Ferrous Sulfate  Anaphylaxis and Rash   Codeine Nausea Only    Sick to her stomach   Erythromycin Nausea And Vomiting and Other (See Comments)   Tramadol  Nausea Only and Other (See Comments)    Loss of appetite and did not eat   Ciprofloxacin Diarrhea    Upsets stomach    VACCINATION STATUS: Immunization History  Administered Date(s) Administered   Fluad Quad(high Dose 65+) 06/15/2019, 09/26/2020   Influenza Split 08/10/2015   Influenza, High Dose Seasonal PF 11/05/2022   Influenza,inj,Quad PF,6+ Mos 05/24/2016, 07/26/2017, 06/03/2018, 07/11/2021   Influenza-Unspecified 08/10/2015, 05/24/2016   Moderna Sars-Covid-2 Vaccination 10/02/2019, 10/30/2019, 08/01/2020   Pneumococcal  Conjugate-13 11/05/2016   Pneumococcal Polysaccharide-23 12/11/2017   Td 03/17/2013    Diabetes She presents for her follow-up diabetic visit. She has type 2 diabetes mellitus. Onset time: She was diagnosed at approximate age of 58 years. Her disease course has been worsening. There are no hypoglycemic associated symptoms. Pertinent negatives for hypoglycemia include no confusion, headaches, pallor or seizures. Associated symptoms include blurred vision (especially in the evenings) and fatigue. Pertinent negatives for diabetes include no chest pain, no polydipsia, no polyphagia, no polyuria and no weight loss. There are no hypoglycemic complications. Symptoms are stable. Diabetic complications include heart disease, nephropathy and PVD. Risk factors for coronary artery disease include dyslipidemia, diabetes mellitus, hypertension, sedentary lifestyle and post-menopausal. Current diabetic treatment includes oral agent (dual therapy). She is compliant with treatment all of the time. Her weight is fluctuating minimally. She is following a generally healthy diet. Meal planning includes avoidance of concentrated sweets and ADA exchanges. She rarely participates in exercise. Her home blood glucose trend is decreasing steadily. Her breakfast blood glucose range is generally 110-130 mg/dl. (She presents today with her meter and logs showing improved, at goal fasting glycemic profile.  Her POCT A1c today is 8.7%, decreasing slightly from last visit of 8.8%.  She denies any hypoglycemia.  There seems to be a discrepancy between her glucose readings and her A1c reading, for unknown reasons.  She notes she is undergoing evaluation for hardware removal of left femur. Analysis of her meter shows 7-day average of 124, 14-day average of 132, 30-day average of 136.  ) An ACE inhibitor/angiotensin II receptor blocker is being taken. She does not see a podiatrist.Eye exam is  current.  Hyperlipidemia This is a chronic problem.  The current episode started more than 1 year ago. The problem is controlled. Recent lipid tests were reviewed and are normal. Exacerbating diseases include chronic renal disease and diabetes. Factors aggravating her hyperlipidemia include beta blockers. Pertinent negatives include no chest pain, myalgias or shortness of breath. Current antihyperlipidemic treatment includes statins. The current treatment provides moderate improvement of lipids. There are no compliance problems.  Risk factors for coronary artery disease include dyslipidemia, diabetes mellitus, hypertension, post-menopausal and a sedentary lifestyle.  Hypertension This is a chronic problem. The current episode started more than 1 year ago. The problem has been gradually improving since onset. The problem is controlled. Associated symptoms include blurred vision (especially in the evenings). Pertinent negatives include no chest pain, headaches, palpitations or shortness of breath. There are no associated agents to hypertension. Risk factors for coronary artery disease include diabetes mellitus, dyslipidemia, sedentary lifestyle and post-menopausal state. Past treatments include beta blockers and ACE inhibitors. The current treatment provides moderate improvement. There are no compliance problems.  Hypertensive end-organ damage includes kidney disease, CAD/MI and PVD. Identifiable causes of hypertension include chronic renal disease.    Review of systems  Constitutional: + Minimally fluctuating body weight,  current Body mass index is 22.94 kg/m. , no fatigue, no subjective hyperthermia, no subjective hypothermia Eyes: no blurry vision, no xerophthalmia ENT: no sore throat, no nodules palpated in throat, no dysphagia/odynophagia, no hoarseness Cardiovascular: no chest pain, no shortness of breath, no palpitations, no leg swelling Respiratory: no cough, no shortness of breath Gastrointestinal: no nausea/vomiting/diarrhea Musculoskeletal:  Left leg pain- walks with cane Skin: no rashes, no hyperemia Neurological: no tremors, no numbness, no tingling, no dizziness Psychiatric: no depression, no anxiety   Objective:    BP 106/78 (BP Location: Left Arm, Patient Position: Sitting, Cuff Size: Large)   Pulse 64   Ht 5' 2 (1.575 m)   Wt 125 lb 6.4 oz (56.9 kg)   BMI 22.94 kg/m   Wt Readings from Last 3 Encounters:  03/12/24 125 lb 6.4 oz (56.9 kg)  01/10/24 125 lb 3.2 oz (56.8 kg)  12/03/23 125 lb 9.6 oz (57 kg)    BP Readings from Last 3 Encounters:  03/12/24 106/78  01/10/24 (!) 154/69  12/03/23 120/60     Physical Exam- Limited  Constitutional:  Body mass index is 22.94 kg/m. , not in acute distress, normal state of mind Eyes:  EOMI, no exophthalmos Musculoskeletal: no gross deformities, strength intact in all four extremities, no gross restriction of joint movements Skin:  no rashes, no hyperemia Neurological: no tremor with outstretched hands   Diabetic Foot Exam - Simple   Simple Foot Form Diabetic Foot exam was performed with the following findings: Yes 03/12/2024  9:54 AM  Visual Inspection No deformities, no ulcerations, no other skin breakdown bilaterally: Yes Sensation Testing Intact to touch and monofilament testing bilaterally: Yes Pulse Check Posterior Tibialis and Dorsalis pulse intact bilaterally: Yes Comments      CMP ( most recent) CMP     Component Value Date/Time   NA 142 11/12/2023 0925   NA 137 03/27/2013 0545   K 4.7 11/12/2023 0925   K 3.9 03/27/2013 0545   CL 107 11/12/2023 0925   CL 103 03/27/2013 0545   CO2 27 11/12/2023 0925   CO2 27 03/27/2013 0545   GLUCOSE 153 (H) 11/12/2023 0925   GLUCOSE 122 (H) 03/27/2013 0545   BUN 29 (H) 11/12/2023 0925   BUN 10  03/27/2013 0545   CREATININE 1.10 11/12/2023 0925   CREATININE 1.08 (H) 06/19/2023 0922   CALCIUM  10.6 (H) 11/12/2023 0925   CALCIUM  8.9 03/27/2013 0545   PROT 6.7 11/12/2023 0925   PROT 7.6 10/07/2011 1316    ALBUMIN 4.4 11/12/2023 0925   ALBUMIN 4.5 10/07/2011 1316   AST 28 11/12/2023 0925   AST 26 10/07/2011 1316   ALT 22 11/12/2023 0925   ALT 26 10/07/2011 1316   ALKPHOS 80 11/12/2023 0925   ALKPHOS 46 (L) 10/07/2011 1316   BILITOT 0.6 11/12/2023 0925   BILITOT 0.5 10/07/2011 1316   GFRNONAA 47 (L) 07/04/2023 0722   GFRNONAA 54 (L) 03/03/2020 0000   GFRAA 63 03/03/2020 0000     Diabetic Labs (most recent): Lab Results  Component Value Date   HGBA1C 8.7 (A) 03/12/2024   HGBA1C 8.8 (A) 12/03/2023   HGBA1C 8.1 (A) 04/01/2023   MICROALBUR 80 mg/L 03/12/2024   MICROALBUR 30mg /L 04/01/2023   MICROALBUR 30 04/26/2021     Lipid Panel ( most recent) Lipid Panel     Component Value Date/Time   CHOL 128 11/12/2023 0925   CHOL 158 12/12/2020 1115   CHOL 141 10/08/2011 0457   TRIG 141.0 11/12/2023 0925   TRIG 179 10/08/2011 0457   HDL 48.50 11/12/2023 0925   HDL 52 12/12/2020 1115   HDL 37 (L) 10/08/2011 0457   CHOLHDL 3 11/12/2023 0925   VLDL 28.2 11/12/2023 0925   VLDL 36 10/08/2011 0457   LDLCALC 51 11/12/2023 0925   LDLCALC 70 04/24/2021 1043   LDLCALC 68 10/08/2011 0457      Lab Results  Component Value Date   TSH 2.90 11/12/2023   TSH 1.78 11/13/2022   TSH 2.44 11/01/2021   TSH 1.94 06/07/2021   TSH 1.99 04/24/2021   TSH 1.66 03/03/2020   TSH 2.68 03/05/2019   TSH 2.96 11/06/2017   TSH 1.24 02/01/2017   TSH 1.86 02/06/2016   FREET4 1.4 04/24/2021   FREET4 1.4 03/03/2020      Assessment & Plan:   1) Type 2 diabetes complicated by stage 2 CKD  - Cheyne M Keim has currently controlled asymptomatic type 2 DM since  88 years of age.  She presents today with her meter and logs showing improved, at goal fasting glycemic profile.  Her POCT A1c today is 8.7%, decreasing slightly from last visit of 8.8%.  She denies any hypoglycemia.  There seems to be a discrepancy between her glucose readings and her A1c reading, for unknown reasons.  She notes she is  undergoing evaluation for hardware removal of left femur. Analysis of her meter shows 7-day average of 124, 14-day average of 132, 30-day average of 136.    -recent labs reviewed. POCT UM today was normal.  - I had a long discussion with her about the progressive nature of diabetes and the pathology behind its complications. -her diabetes is complicated by stage 2 CKD and she remains at a high risk for more acute and chronic complications which include CAD, CVA, CKD, retinopathy, and neuropathy. These are all discussed in detail with her.  The following Lifestyle Medicine recommendations according to American College of Lifestyle Medicine [facility]) were discussed and offered to patient and she agrees to start the journey:  A. Whole Foods, Plant-based plate comprising of fruits and vegetables, plant-based proteins, whole-grain carbohydrates was discussed in detail with the patient.   A list for source of those nutrients were also provided to the patient.  Patient  will use only water or unsweetened tea for hydration. B.  The need to stay away from risky substances including alcohol, smoking; obtaining 7 to 9 hours of restorative sleep, at least 150 minutes of moderate intensity exercise weekly, the importance of healthy social connections,  and stress reduction techniques were discussed. C.  A full color page of  Calorie density of various food groups per pound showing examples of each food groups was provided to the patient.  - Nutritional counseling repeated at each appointment due to patients tendency to fall back in to old habits.  - The patient admits there is a room for improvement in their diet and drink choices. -  Suggestion is made for the patient to avoid simple carbohydrates from their diet including Cakes, Sweet Desserts / Pastries, Ice Cream, Soda (diet and regular), Sweet Tea, Candies, Chips, Cookies, Sweet Pastries, Store Bought Juices, Alcohol in Excess of 1-2 drinks a day, Artificial  Sweeteners, Coffee Creamer, and Sugar-free Products. This will help patient to have stable blood glucose profile and potentially avoid unintended weight gain.   - I encouraged the patient to switch to unprocessed or minimally processed complex starch and increased protein intake (animal or plant source), fruits, and vegetables.   - Patient is advised to stick to a routine mealtimes to eat 3 meals a day and avoid unnecessary snacks (to snack only to correct hypoglycemia).  - I have approached her with the following individualized plan to manage  her diabetes and patient agrees:   -She is advised to continue Metformin  500 mg ER daily with breakfast and Glipizide  5 mg XL daily at breakfast.  We did talk about potentially adding basal insulin  if A1c does not continue to improve on subsequent visits.  We may also need to tighten up on glucose control if she is to have surgery to optimize healing.  I asked her to reach out to me if we need to change our game plan sooner based on what the surgeon says.  -She is encouraged to continue monitoring blood glucose at least once daily, before breakfast and call the clinic if she gets readings less than 70 or greater than 200 for 3 tests in a row.  - she is not a candidate for SGLT2 inhibitors or full dose Metformin  due to concurrent renal insufficiency.  - she is not a candidate for incretin therapy due to her body habitus.    She could not afford copay for Januvia , or Jardiance.  - Patient specific target  A1c;  LDL, HDL, Triglycerides, and  Waist Circumference were discussed in detail.  2) Blood Pressure /Hypertension:  Her blood pressure is controlled to target.  She is advised to continue current medications as prescribed by her PCP.  3) Lipids/Hyperlipidemia: Her most recent lipid panel from 11/12/23 shows controlled LDL at 51.  She is advised to continue Crestor  40 mg po daily at bedtime.  Side effects and precautions discussed with her.    4)   Weight/Diet: Her Body mass index is 22.94 kg/m.  -She is not a candidate for weight loss.  Exercise, and detailed carbohydrates information provided  -  detailed on discharge instructions.  5) Chronic Care/Health Maintenance: -she is on ACEI/ARB and Statin medications and  is encouraged to initiate and continue to follow up with Ophthalmology, Dentist,  Podiatrist at least yearly or according to recommendations, and advised to   stay away from smoking. I have recommended yearly flu vaccine and pneumonia vaccine at least every 5  years; moderate intensity exercise for up to 150 minutes weekly; and  sleep for at least 7 hours a day.  - she is advised to maintain close follow up with [place], [name] LABOR, MD for primary care needs, as well as her other providers for optimal and coordinated care.     I spent  46  minutes in the care of the patient today including review of labs from CMP, Lipids, Thyroid  Function, Hematology (current and previous including abstractions from other facilities); face-to-face time discussing  her blood glucose readings/logs, discussing hypoglycemia and hyperglycemia episodes and symptoms, medications doses, her options of short and long term treatment based on the latest standards of care / guidelines;  discussion about incorporating lifestyle medicine;  and documenting the encounter. Risk reduction counseling performed per USPSTF guidelines to reduce obesity and cardiovascular risk factors.     Please refer to Patient Instructions for Blood Glucose Monitoring and Insulin /Medications Dosing Guide  in media tab for additional information. Please  also refer to  Patient Self Inventory in the Media  tab for reviewed elements of pertinent patient history.  Donia HERO Dobbins participated in the discussions, expressed understanding, and voiced agreement with the above plans.  All questions were answered to her satisfaction. she is encouraged to contact clinic should she have any  questions or concerns prior to her return visit.    Follow up plan: - Return in about 3 months (around 06/12/2024) for Diabetes F/U with A1c in office, No previsit labs, Bring meter and logs.  Benton Rio, [facility] [address] [place], [place] [address] Phone: [phone] Fax: [phone]  03/12/2024, 10:13 AM

## 2024-03-17 ENCOUNTER — Ambulatory Visit: Payer: Self-pay | Admitting: Student

## 2024-04-06 ENCOUNTER — Encounter (HOSPITAL_COMMUNITY): Payer: Self-pay | Admitting: Student

## 2024-04-06 ENCOUNTER — Other Ambulatory Visit: Payer: Self-pay

## 2024-04-06 NOTE — Anesthesia Preprocedure Evaluation (Signed)
 Anesthesia Evaluation  Patient identified by MRN, date of birth, ID band Patient awake    Reviewed: Allergy & Precautions, NPO status , Patient's Chart, lab work & pertinent test results  History of Anesthesia Complications Negative for: history of anesthetic complications  Airway Mallampati: I  TM Distance: >3 FB Neck ROM: Full    Dental  (+) Dental Advisory Given   Pulmonary neg shortness of breath, neg sleep apnea, neg COPD, neg recent URI   breath sounds clear to auscultation       Cardiovascular hypertension, Pt. on home beta blockers and Pt. on medications (-) angina + CAD, + Cardiac Stents, + CABG and + Peripheral Vascular Disease  + dysrhythmias Atrial Fibrillation + Valvular Problems/Murmurs MR  Rhythm:Regular  Left Ventricle: The left ventricle has normal systolic function, with an  ejection fraction of 60-65%. The cavity size was normal. There is no  increase in left ventricular wall thickness. Left ventricular diastolic  Doppler parameters are consistent with  impaired relaxation.  Right Ventricle: The right ventricle has normal systolic function. The  cavity was normal. There is no increase in right ventricular wall  thickness. Right ventricular systolic pressure is mildly elevated with an  estimated pressure of 32.7 mmHg.  Left Atrium: left atrial size was mildly dilated  Right Atrium: right atrial size was normal in size. Right atrial pressure  is estimated at 10 mmHg.  Interatrial Septum: No atrial level shunt detected by color flow Doppler.  Pericardium: There is no evidence of pericardial effusion.  Mitral Valve: The mitral valve is normal in structure. Mild thickening of  the mitral valve leaflet. There is mild mitral annular calcification  present. Mitral valve regurgitation is mild to moderate by color flow  Doppler.  Tricuspid Valve: The tricuspid valve is normal in structure. Tricuspid  valve  regurgitation is mild by color flow Doppler.  Aortic Valve: The aortic valve is normal in structure. Mild thickening of  the aortic valve. Aortic valve regurgitation was not assessed by color  flow Doppler.  Pulmonic Valve: The pulmonic valve was grossly normal. Pulmonic valve  regurgitation was not assessed by color flow Doppler. No pulmonic valve  vegetation visualized.  Venous: The inferior vena cava measures 2.30 cm, is normal in size with  greater than 50% respiratory variability.     Neuro/Psych neg Seizures PSYCHIATRIC DISORDERS Anxiety        GI/Hepatic Neg liver ROS,GERD  Controlled,,  Endo/Other  diabetes, Type 2    Renal/GU Renal disease     Musculoskeletal  (+) Arthritis ,    Abdominal   Peds  Hematology  (+) Blood dyscrasia, anemia Lab Results      Component                Value               Date                      WBC                      4.7                 04/08/2024                HGB                      11.9 (L)  04/08/2024                HCT                      36.1                04/08/2024                MCV                      91.4                04/08/2024                PLT                      142 (L)             04/08/2024              Anesthesia Other Findings Plavix  last dose 25/20  88-year-old female follows with cardiology for history of CAD s/p CABG in 1995 with subsequent stenting x 2 to her graft to the OM most recently in 2013, left renal PCI in 1998 with redo 2010, HTN, SVT, carotid stenosis (40 to 59% LICA, minimal RICA by duplex 07/2023).  Nuclear stress 12/2022 was low risk.  Last seen by Dr. Gollan on 07/23/2023, stable at that time from cardiac standpoint, no changes to management.   Other pertinent history includes GERD on PPI, non-insulin -dependent DM2, A1c 8.7 on 03/12/2024.   Patient will need day of surgery labs and evaluation.   EKG 07/23/2023: Normal sinus rhythm.  Rate 67. Low voltage QRS. Cannot rule out Anterior  infarct , age undetermined   Nuclear stress 12/17/2022:   The study is normal. The study is low risk.   No ST deviation was noted.   LV perfusion is normal. There is no evidence of ischemia. There is no evidence of infarction.   Left ventricular function is normal. End diastolic cavity size is normal. End systolic cavity size is normal.   CT attenuation images show severe aortic and coronary artery calcifications.   Reproductive/Obstetrics                              Anesthesia Physical Anesthesia Plan  ASA: 3  Anesthesia Plan: General   Post-op Pain Management: Toradol  IV (intra-op)*   Induction: Intravenous  PONV Risk Score and Plan: 4 or greater and Ondansetron  and Dexamethasone   Airway Management Planned: Oral ETT  Additional Equipment: None  Intra-op Plan:   Post-operative Plan: Extubation in OR  Informed Consent: I have reviewed the patients History and Physical, chart, labs and discussed the procedure including the risks, benefits and alternatives for the proposed anesthesia with the patient or authorized representative who has indicated his/her understanding and acceptance.     Dental advisory given  Plan Discussed with: CRNA  Anesthesia Plan Comments: (PAT note by Lynwood Hope, PA-C: 88 year old female follows with cardiology for history of CAD s/p CABG in 1995 with subsequent stenting x 2 to her graft to the OM most recently in 2013, left renal PCI in 1998 with redo 2010, HTN, SVT, carotid stenosis (40 to 59% LICA, minimal RICA by duplex 07/2023).  Nuclear stress 12/2022 was low risk.  Last seen by Dr. Gollan on 07/23/2023, stable at that time from cardiac standpoint, no changes  to management.  Other pertinent history includes GERD on PPI, non-insulin -dependent DM2, A1c 8.7 on 03/12/2024.  Patient will need day of surgery labs and evaluation.  EKG 07/23/2023: Normal sinus rhythm.  Rate 67. Low voltage QRS. Cannot rule out Anterior  infarct , age undetermined  Nuclear stress 12/17/2022:   The study is normal. The study is low risk.   No ST deviation was noted.   LV perfusion is normal. There is no evidence of ischemia. There is no evidence of infarction.   Left ventricular function is normal. End diastolic cavity size is normal. End systolic cavity size is normal.   CT attenuation images show severe aortic and coronary artery calcifications.  TTE 12/19/2018: 1. The left ventricle has normal systolic function with an ejection  fraction of 60-65%. The cavity size was normal. Left ventricular diastolic  Doppler parameters are consistent with impaired relaxation.   2. The right ventricle has normal systolic function. The cavity was  normal. There is no increase in right ventricular wall thickness.Mildly  elevated RVSP.   3. Left atrial size was mildly dilated.   4. Mitral valve regurgitation is mild to moderate by color flow Doppler.   )         Anesthesia Quick Evaluation

## 2024-04-06 NOTE — H&P (Signed)
 [facility] ([facility]) H&P  Patient ID: Tracey [name] MRN: [id number] DOB/AGE: [birth year] 86 y.o.  Reason for Surgery: HWR Left femur  HPI: Tracey [name] is a 88 y.o. female with past medical history significant for hypertension, hyperlipidemia, dysrhythmia, CAD s/p CABG in 1995, peripheral vascular disease,diabetes mellitus type 2, CKD stage II, bilateral knee replacements, and arthritis presented for surgery on left lower extremity.  Patient underwent ORIF of left periprosthetic distal femur fracture on 07/01/2019 for after sustaining a fall at home.  Over the last 9 months, patient has done well to fully heal her fracture but unfortunately does have pain in the left knee/distal thigh.  She has undergone extensive physical therapy for lower extremity strengthening but unfortunately this has not fully resolved her problem.  She presents now for hardware removal. Patient does have a history of diabetes, last hemoglobin A1c 8.7 (03/12/2024).  Patient on Plavix  for CAD.  Past Medical History:  Diagnosis Date   Anxiety    Arthritis    Atrial fibrillation (HCC)    history   Chronic kidney disease    ckd stage II   Coronary artery disease    Diabetes mellitus    non insulin  dependent   Dysrhythmia    atrial fibrillation, history of   GERD (gastroesophageal reflux disease)    Heart murmur    History of kidney stones    Hyperlipidemia    Hypertension    Peripheral vascular disease (HCC)    Rectocele    Syncope 09/2019   UTI (urinary tract infection)    Past Surgical History:  Procedure Laterality Date   ABDOMINAL AORTA STENT     APPENDECTOMY     BREAST BIOPSY Left    20 years ago   BREAST BIOPSY Right 2008   BREAST EXCISIONAL BIOPSY     rt 2008 lt 20 years ago   CARDIAC CATHETERIZATION     CATARACT EXTRACTION W/PHACO Left 08/15/2015   Procedure: CATARACT EXTRACTION PHACO AND INTRAOCULAR LENS PLACEMENT (IOC);  Surgeon: Steven Dingeldein, MD;  Location: [facility] ORS;   Service: Ophthalmology;  Laterality: Left;  US  01:28 AP% 26.4 CDE 35.73 fluid pack # [id number]   COLONOSCOPY WITH PROPOFOL  N/A 01/16/2016   Procedure: COLONOSCOPY WITH PROPOFOL ;  Surgeon: Lamar ONEIDA Holmes, MD;  Location: [facility] ENDOSCOPY;  Service: Endoscopy;  Laterality: N/A;   CORONARY ANGIOPLASTY WITH STENT PLACEMENT     CORONARY ARTERY BYPASS GRAFT  1995   with LIMA  to the LAD, SVG to OM1, SVG to PDA   ESOPHAGOGASTRODUODENOSCOPY (EGD) WITH PROPOFOL  N/A 01/16/2016   Procedure: ESOPHAGOGASTRODUODENOSCOPY (EGD) WITH PROPOFOL ;  Surgeon: Lamar ONEIDA Holmes, MD;  Location: [facility] END[facility]COPY;  Service: Endoscopy;  Laterality: N/A;   EYE SURGERY     JOINT REPLACEMENT Right    KNEE ARTHROPLASTY Left 01/06/2020   Procedure: COMPUTER ASSISTED TOTAL KNEE ARTHROPLASTY;  Surgeon: Mardee Lynwood SQUIBB, MD;  Location: [facility] ORS;  Service: Orthopedics;  Laterality: Left;   LOWER EXTREMITY ANGIOGRAPHY N/A 07/08/2019   Procedure: Abdominal Aortagraphy;  Surgeon: Marea Selinda RAMAN, MD;  Location: [facility] INVASIVE CV LAB;  Service: Cardiovascular;  Laterality: N/A;   ORIF FEMUR FRACTURE Left 07/01/2023   Procedure: OPEN REDUCTION INTERNAL FIXATION (ORIF) DISTAL FEMUR FRACTURE;  Surgeon: Kendal Franky SQUIBB, MD;  Location: [facility] OR;  Service: Orthopedics;  Laterality: Left;   RENAL ANGIOGRAPHY Left 08/26/2019   Procedure: RENAL ANGIOGRAPHY;  Surgeon: Marea Selinda RAMAN, MD;  Location: [facility] INVASIVE CV LAB;  Service: Cardiovascular;  Laterality: Left;  RENAL ARTERY STENT     left, By Dr Primus   REPLACEMENT TOTAL KNEE  03/25/2013   right   TONSILLECTOMY     Family History  Problem Relation Age of Onset   Heart disease Father    Other Brother        open heart surgery   Cancer Brother    Other Brother        open heart surgery   Other Brother        open heart surgery   Cancer Daughter    Hyperlipidemia Daughter    Breast cancer Neg Hx    Kidney cancer Neg Hx    Bladder Cancer Neg Hx     Social History:  reports that she has never  smoked. She has never used smokeless tobacco. She reports that she does not drink alcohol and does not use drugs.  Allergies:  Allergies  Allergen Reactions   Ferrous Sulfate  Anaphylaxis and Rash   Codeine Nausea Only    Sick to her stomach   Erythromycin Nausea And Vomiting and Other (See Comments)   Tramadol  Nausea Only and Other (See Comments)    Loss of appetite and did not eat   Ciprofloxacin Diarrhea    Upsets stomach    Medications: Prior to Admission medications   Medication Sig Start Date End Date Taking? Authorizing Provider  aspirin  EC 81 MG tablet Take 81 mg by mouth daily. Swallow whole.   Yes [provider]  carvedilol  (COREG ) 3.125 MG tablet TAKE ONE TABLET BY MOUTH ONCE DAILY Patient taking differently: Take 3.125 mg by mouth at bedtime. 05/07/23  Yes Gollan, Timothy J, MD  Cholecalciferol (VITAMIN D3) 1.25 MG (50000 UT) CAPS Take 5,000 Units by mouth daily.   Yes [provider]  clopidogrel  (PLAVIX ) 75 MG tablet TAKE ONE TABLET BY MOUTH ONCE DAILY 09/26/23  Yes Gollan, Timothy J, MD  CRANBERRY PO Take 1 tablet by mouth daily.   Yes [provider]  cyanocobalamin  1000 MCG tablet Take 1 tablet (1,000 mcg total) by mouth daily. 07/05/23  Yes Gonfa, [name], MD  ezetimibe  (ZETIA ) 10 MG tablet TAKE ONE TABLET BY MOUTH ONCE DAILY 05/07/23  Yes Gollan, Timothy J, MD  glipiZIDE  (GLUCOTROL  XL) 5 MG 24 hr tablet Take 1 tablet (5 mg total) by mouth daily with breakfast. 03/12/24  Yes Therisa Benton PARAS, NP  hydroxypropyl methylcellulose / hypromellose (ISOPTO TEARS / GONIOVISC) 2.5 % ophthalmic solution Place 1 drop into both eyes daily as needed for dry eyes.   Yes [provider]  lisinopril  (ZESTRIL ) 10 MG tablet TAKE ONE TABLET BY MOUTH ONCE DAILY 05/07/23  Yes Gollan, Timothy J, MD  metFORMIN  (GLUCOPHAGE -XR) 500 MG 24 hr tablet TAKE ONE TABLET BY MOUTH ONCE DAILY 01/31/24  Yes Therisa Benton PARAS, NP  Multiple Vitamins-Minerals (MULTIVITAMIN WITH  MINERALS) tablet Take 1 tablet by mouth daily.   Yes [provider]  nitroGLYCERIN  (NITROSTAT ) 0.4 MG SL tablet Place 1 tablet (0.4 mg total) under the tongue every 5 (five) minutes as needed for chest pain. 06/15/22 08/22/37 Yes Gollan, Timothy J, MD  pantoprazole  (PROTONIX ) 40 MG tablet TAKE ONE TABLET BY MOUTH EVERY OTHER DAY 02/07/24  Yes Tower, Laine LABOR, MD  polyethylene glycol (MIRALAX / GLYCOLAX) 17 g packet Take 8.5 g by mouth daily as needed for mild constipation or moderate constipation.   Yes [provider]  rosuvastatin  (CRESTOR ) 40 MG tablet TAKE ONE TABLET BY MOUTH ONCE DAILY 05/07/23  Yes  Gollan, Timothy J, MD  senna-docusate (SENOKOT-S) 8.6-50 MG tablet Take 2 tablets by mouth 2 (two) times daily between meals as needed for mild constipation. 07/03/23  Yes Gonfa, [name], MD  glucose blood (FREESTYLE LITE) test strip check blood sugar TWICE DAILY 01/08/24   Therisa Benton PARAS, NP  OneTouch Delica Lancets 33G MISC USE TO TEST TWICE A DAY 12/09/23   Lenis Ethelle ORN, MD   I have reviewed the patient's current medications. Prior to Admission:  No medications prior to admission.    Positive ROS: All other systems have been reviewed and were otherwise negative with the exception of those mentioned in the HPI and as above.  Exam: There were no vitals taken for this visit. General: Alert and oriented, no acute distress Cardiovascular: No pedal edema Respiratory: No cyanosis, no use of accessory musculature GI: No organomegaly, abdomen is soft and non-tender Skin: No lesions in the area of chief complaint Neurologic: Sensation intact distally Psychiatric: Patient is competent for consent with normal mood and affect  Musculoskeletal: Left lower extremity: Healed surgical incisions.  Some tenderness directly over the plate distally.  No significant tenderness throughout the remainder of the thigh.  No calf tenderness.  Tolerates gentle knee and ankle range of motion.   Neurovascular at baseline.  Motor and sensory function at baseline.  Right lower extremity: Skin without lesions. No tenderness to palpation. Full painless ROM, full strength in each muscle group without evidence of instability. Motor/sensory function at baseline. Neurovascularly intact.   Medical Decision Making: Data: Imaging: AP and lateral views of the left femur show plate and screws in excellent position around previous knee prosthesis.  Fracture fully healed.  Labs: No results found for this or any previous visit (from the past 24 hours).   Medical history and chart was reviewed and case discussed with attending provider.  Assessment/Plan: 88 year old female s/p ORIF left periprosthetic distal femur fracture on 07/01/2023, presenting with continued left lower extremity pain  Patient has fully healed her fracture reports she continues to have pain in the left leg.  At this point, would recommend proceeding with hardware removal from the left femur.  Risks and benefits of the procedure have been discussed with the patient.  She agrees to proceed with surgery.  Consent will be obtained.  Will plan to discharge patient home postoperatively and allow her to continue weightbearing as tolerated on the left lower extremity.  Lauraine PATRIC Moores PA-C [facility] [phone] (office) [email or web address]

## 2024-04-06 NOTE — Progress Notes (Signed)
 PCP - Dr. Laine Balls Cardiologist - Dr. Evalene Lunger  PPM/ICD - denies   Chest x-ray - 06/30/23 EKG - 07/23/23 Stress Test - 12/17/22 ECHO - 12/19/18 Cardiac Cath - 10/08/11  CPAP - denies  Fasting Blood Sugar - 100-130 Checks Blood Sugar once/day  Blood Thinner Instructions: pt states she took last dose Plavix  on 04/03/24, as instructed by surgeon. Aspirin  Instructions: f/u with surgeon  ERAS Protcol - no, NPO  COVID TEST- n/a  Anesthesia review: yes, cardiac hx. Last A1C 8.7 on 6/26  Patient verbally denies any shortness of breath, fever, cough and chest pain during phone call      Questions were answered. Patient verbalized understanding of instructions.

## 2024-04-06 NOTE — Pre-Procedure Instructions (Signed)
 -------------  SDW INSTRUCTIONS given:  Your procedure is scheduled on 7/23.  Report to [facility] Main Entrance A at 05:30 A.M., and check in at the Admitting office.  Any questions or running late day of surgery: call [phone]    Remember:  Do not eat or drink after midnight the night before your surgery    Follow your surgeon's instructions on when to stop Aspirin  and Plavix .  If no instructions were given by your surgeon then you will need to call the office to get those instructions.     Take these medicines the morning of surgery with A SIP OF WATER  ezetimibe  (ZETIA )  pantoprazole  (PROTONIX )  rosuvastatin  (CRESTOR )    May take these medicines IF NEEDED: hydroxypropyl methylcellulose / hypromellose (ISOPTO TEARS / GONIOVISC)  nitroGLYCERIN  (NITROSTAT )- If you have to take this medication prior to surgery, please call [phone] and report this to a nurse    As of today, STOP taking any Aspirin  (unless otherwise instructed by your surgeon) Aleve, Naproxen, Ibuprofen , Motrin , Advil , Goody's, BC's, all herbal medications, fish oil, and all vitamins.  WHAT DO I DO ABOUT MY DIABETES MEDICATION?   Do not take Glipizide  or Metformin  the morning of surgery.    HOW TO MANAGE YOUR DIABETES BEFORE AND AFTER SURGERY  Why is it important to control my blood sugar before and after surgery? Improving blood sugar levels before and after surgery helps healing and can limit problems. A way of improving blood sugar control is eating a healthy diet by:  Eating less sugar and carbohydrates  Increasing activity/exercise  Talking with your doctor about reaching your blood sugar goals High blood sugars (greater than 180 mg/dL) can raise your risk of infections and slow your recovery, so you will need to focus on controlling your diabetes during the weeks before surgery. Make sure that the doctor who takes care of your diabetes knows about your planned surgery including the date and  location.  How do I manage my blood sugar before surgery? Check your blood sugar at least 4 times a day, starting 2 days before surgery, to make sure that the level is not too high or low.  Check your blood sugar the morning of your surgery when you wake up and every 2 hours until you get to the Short Stay unit.  If your blood sugar is less than 70 mg/dL, you will need to treat for low blood sugar: Do not take insulin . Treat a low blood sugar (less than 70 mg/dL) with  cup of clear juice (cranberry or apple), 4 glucose tablets, OR glucose gel. Recheck blood sugar in 15 minutes after treatment (to make sure it is greater than 70 mg/dL). If your blood sugar is not greater than 70 mg/dL on recheck, call [phone] for further instructions. Report your blood sugar to the short stay nurse when you get to Short Stay.  If you are admitted to the hospital after surgery: Your blood sugar will be checked by the staff and you will probably be given insulin  after surgery (instead of oral diabetes medicines) to make sure you have good blood sugar levels. The goal for blood sugar control after surgery is 80-180 mg/dL.   Do NOT Smoke (Tobacco/Vaping) 24 hours prior to your procedure  If you use a CPAP at night, you may bring all equipment for your overnight stay.     You will be asked to remove any contacts, glasses, piercing's, hearing aid's, dentures/partials prior to surgery. Please bring cases  for these items if needed.     Patients discharged the day of surgery will not be allowed to drive home, and someone needs to stay with them for 24 hours.  SURGICAL WAITING ROOM VISITATION Patients may have no more than 2 support people in the waiting area - these visitors may rotate.   Pre-op nurse will coordinate an appropriate time for 2 ADULT support persons, who may not rotate, to accompany patient in pre-op.  Children under the age of 57 must have an adult with them who is not the patient and must  remain in the main waiting area with an adult.  If the patient needs to stay at the hospital during part of their recovery, the visitor guidelines for inpatient rooms apply.  Please refer to the [facility] website for the visitor guidelines for any additional information.   Special instructions:   [place]- Preparing For Surgery   Please follow these instructions carefully.   Shower the NIGHT BEFORE SURGERY and the MORNING OF SURGERY with DIAL Soap.   Pat yourself dry with a CLEAN TOWEL.  Wear CLEAN PAJAMAS to bed the night before surgery  Place CLEAN SHEETS on your bed the night of your first shower and DO NOT SLEEP WITH PETS.   Additional instructions for the day of surgery: DO NOT APPLY any lotions, deodorants, cologne, or perfumes.   Do not wear jewelry or makeup Do not wear nail polish, gel polish, artificial nails, or any other type of covering on natural nails (fingers and toes) Do not bring valuables to the hospital. [facility] is not responsible for valuables/personal belongings. Put on clean/comfortable clothes.  Please brush your teeth.  Ask your nurse before applying any prescription medications to the skin.

## 2024-04-06 NOTE — Progress Notes (Signed)
 Anesthesia Chart Review:  88 year old female follows with cardiology for history of CAD s/p CABG in 1995 with subsequent stenting x 2 to her graft to the OM most recently in 2013, left renal PCI in 1998 with redo 2010, HTN, SVT, carotid stenosis (40 to 59% LICA, minimal RICA by duplex 07/2023).  Nuclear stress 12/2022 was low risk.  Last seen by Dr. Gollan on 07/23/2023, stable at that time from cardiac standpoint, no changes to management.  Other pertinent history includes GERD on PPI, non-insulin -dependent DM2, A1c 8.7 on 03/12/2024.  Patient will need day of surgery labs and evaluation.  EKG 07/23/2023: Normal sinus rhythm.  Rate 67. Low voltage QRS. Cannot rule out Anterior infarct , age undetermined  Nuclear stress 12/17/2022:   The study is normal. The study is low risk.   No ST deviation was noted.   LV perfusion is normal. There is no evidence of ischemia. There is no evidence of infarction.   Left ventricular function is normal. End diastolic cavity size is normal. End systolic cavity size is normal.   CT attenuation images show severe aortic and coronary artery calcifications.  TTE 12/19/2018: 1. The left ventricle has normal systolic function with an ejection  fraction of 60-65%. The cavity size was normal. Left ventricular diastolic  Doppler parameters are consistent with impaired relaxation.   2. The right ventricle has normal systolic function. The cavity was  normal. There is no increase in right ventricular wall thickness.Mildly  elevated RVSP.   3. Left atrial size was mildly dilated.   4. Mitral valve regurgitation is mild to moderate by color flow Doppler.     Lynwood Geofm RIGGERS [facility] Short Stay Center/Anesthesiology Phone [phone] 04/06/2024 1:34 PM

## 2024-04-08 ENCOUNTER — Encounter (HOSPITAL_COMMUNITY)
Admission: RE | Disposition: A | Discharge status: Home / Self Care | Payer: Self-pay | Source: Home / Self Care | Attending: Student

## 2024-04-08 ENCOUNTER — Other Ambulatory Visit: Payer: Self-pay

## 2024-04-08 ENCOUNTER — Ambulatory Visit (HOSPITAL_COMMUNITY)

## 2024-04-08 ENCOUNTER — Ambulatory Visit (HOSPITAL_BASED_OUTPATIENT_CLINIC_OR_DEPARTMENT_OTHER): Payer: Self-pay | Admitting: Physician Assistant

## 2024-04-08 ENCOUNTER — Encounter (HOSPITAL_COMMUNITY): Payer: Self-pay | Admitting: Student

## 2024-04-08 ENCOUNTER — Ambulatory Visit (HOSPITAL_COMMUNITY)
Admission: RE | Admit: 2024-04-08 | Discharge: 2024-04-08 | Disposition: A | Discharge status: Home / Self Care | Attending: Student | Admitting: Student

## 2024-04-08 ENCOUNTER — Ambulatory Visit (HOSPITAL_COMMUNITY): Payer: Self-pay | Admitting: Physician Assistant

## 2024-04-08 DIAGNOSIS — N182 Chronic kidney disease, stage 2 (mild): Secondary | ICD-10-CM | POA: Insufficient documentation

## 2024-04-08 DIAGNOSIS — I2510 Atherosclerotic heart disease of native coronary artery without angina pectoris: Secondary | ICD-10-CM | POA: Diagnosis not present

## 2024-04-08 DIAGNOSIS — X58XXXA Exposure to other specified factors, initial encounter: Secondary | ICD-10-CM | POA: Insufficient documentation

## 2024-04-08 DIAGNOSIS — Z79899 Other long term (current) drug therapy: Secondary | ICD-10-CM | POA: Diagnosis not present

## 2024-04-08 DIAGNOSIS — Z472 Encounter for removal of internal fixation device: Secondary | ICD-10-CM | POA: Diagnosis not present

## 2024-04-08 DIAGNOSIS — E1122 Type 2 diabetes mellitus with diabetic chronic kidney disease: Secondary | ICD-10-CM | POA: Insufficient documentation

## 2024-04-08 DIAGNOSIS — K219 Gastro-esophageal reflux disease without esophagitis: Secondary | ICD-10-CM | POA: Insufficient documentation

## 2024-04-08 DIAGNOSIS — Z7984 Long term (current) use of oral hypoglycemic drugs: Secondary | ICD-10-CM | POA: Diagnosis not present

## 2024-04-08 DIAGNOSIS — T8484XA Pain due to internal orthopedic prosthetic devices, implants and grafts, initial encounter: Secondary | ICD-10-CM

## 2024-04-08 DIAGNOSIS — I129 Hypertensive chronic kidney disease with stage 1 through stage 4 chronic kidney disease, or unspecified chronic kidney disease: Secondary | ICD-10-CM | POA: Diagnosis not present

## 2024-04-08 DIAGNOSIS — I4891 Unspecified atrial fibrillation: Secondary | ICD-10-CM | POA: Diagnosis not present

## 2024-04-08 DIAGNOSIS — S72452A Displaced supracondylar fracture without intracondylar extension of lower end of left femur, initial encounter for closed fracture: Secondary | ICD-10-CM | POA: Diagnosis not present

## 2024-04-08 HISTORY — PX: HARDWARE REMOVAL: SHX979

## 2024-04-08 LAB — CBC
HCT: 36.1 % (ref 36.0–46.0)
Hemoglobin: 11.9 g/dL — ABNORMAL LOW (ref 12.0–15.0)
MCH: 30.1 pg (ref 26.0–34.0)
MCHC: 33 g/dL (ref 30.0–36.0)
MCV: 91.4 fL (ref 80.0–100.0)
Platelets: 142 K/uL — ABNORMAL LOW (ref 150–400)
RBC: 3.95 MIL/uL (ref 3.87–5.11)
RDW: 12.4 % (ref 11.5–15.5)
WBC: 4.7 K/uL (ref 4.0–10.5)
nRBC: 0 % (ref 0.0–0.2)

## 2024-04-08 LAB — BASIC METABOLIC PANEL WITH GFR
Anion gap: 10 (ref 5–15)
BUN: 31 mg/dL — ABNORMAL HIGH (ref 8–23)
CO2: 25 mmol/L (ref 22–32)
Calcium: 10.7 mg/dL — ABNORMAL HIGH (ref 8.9–10.3)
Chloride: 104 mmol/L (ref 98–111)
Creatinine, Ser: 1.04 mg/dL — ABNORMAL HIGH (ref 0.44–1.00)
GFR, Estimated: 53 mL/min — ABNORMAL LOW (ref >60)
Glucose, Bld: 133 mg/dL — ABNORMAL HIGH (ref 70–99)
Potassium: 3.8 mmol/L (ref 3.5–5.1)
Sodium: 139 mmol/L (ref 135–145)

## 2024-04-08 LAB — GLUCOSE, CAPILLARY
Glucose-Capillary: 122 mg/dL — ABNORMAL HIGH (ref 70–99)
Glucose-Capillary: 170 mg/dL — ABNORMAL HIGH (ref 70–99)

## 2024-04-08 SURGERY — REMOVAL, HARDWARE
Anesthesia: General | Laterality: Left

## 2024-04-08 MED ORDER — ONDANSETRON HCL 4 MG/2ML IJ SOLN
INTRAMUSCULAR | Status: DC | PRN
  Administered 2024-04-08: 4 mg via INTRAVENOUS

## 2024-04-08 MED ORDER — OXYCODONE HCL 5 MG PO TABS
5.0000 mg | ORAL_TABLET | Freq: Once | ORAL | Status: AC | PRN
  Administered 2024-04-08: 5 mg via ORAL

## 2024-04-08 MED ORDER — HYDROCODONE-ACETAMINOPHEN 5-325 MG PO TABS: 1.0000 | ORAL_TABLET | Freq: Four times a day (QID) | ORAL | 0 refills | Status: DC | PRN

## 2024-04-08 MED ORDER — LACTATED RINGERS IV SOLN: INTRAVENOUS | Status: DC

## 2024-04-08 MED ORDER — BUPIVACAINE HCL (PF) 0.25 % IJ SOLN
INTRAMUSCULAR | Status: AC
  Filled 2024-04-08: qty 30

## 2024-04-08 MED ORDER — ACETAMINOPHEN 10 MG/ML IV SOLN: 1000.0000 mg | Freq: Once | INTRAVENOUS | Status: DC | PRN

## 2024-04-08 MED ORDER — VANCOMYCIN HCL 1000 MG IV SOLR
INTRAVENOUS | Status: DC | PRN
  Administered 2024-04-08: 1000 mg via TOPICAL

## 2024-04-08 MED ORDER — SUGAMMADEX SODIUM 200 MG/2ML IV SOLN
INTRAVENOUS | Status: DC | PRN
  Administered 2024-04-08: 200 mg via INTRAVENOUS

## 2024-04-08 MED ORDER — INSULIN ASPART 100 UNIT/ML IJ SOLN: 0.0000 [IU] | INTRAMUSCULAR | Status: DC | PRN

## 2024-04-08 MED ORDER — FENTANYL CITRATE (PF) 250 MCG/5ML IJ SOLN
INTRAMUSCULAR | Status: AC
  Filled 2024-04-08: qty 5

## 2024-04-08 MED ORDER — PROPOFOL 10 MG/ML IV BOLUS
INTRAVENOUS | Status: AC
  Filled 2024-04-08: qty 20

## 2024-04-08 MED ORDER — FENTANYL CITRATE (PF) 100 MCG/2ML IJ SOLN
INTRAMUSCULAR | Status: AC
  Filled 2024-04-08: qty 2

## 2024-04-08 MED ORDER — EPHEDRINE 5 MG/ML INJ
INTRAVENOUS | Status: AC
  Filled 2024-04-08: qty 5

## 2024-04-08 MED ORDER — FENTANYL CITRATE (PF) 100 MCG/2ML IJ SOLN
25.0000 ug | INTRAMUSCULAR | Status: DC | PRN
  Administered 2024-04-08 (×2): 25 ug via INTRAVENOUS

## 2024-04-08 MED ORDER — FENTANYL CITRATE (PF) 250 MCG/5ML IJ SOLN
INTRAMUSCULAR | Status: DC | PRN
  Administered 2024-04-08: 25 ug via INTRAVENOUS
  Administered 2024-04-08: 100 ug via INTRAVENOUS

## 2024-04-08 MED ORDER — DEXAMETHASONE SODIUM PHOSPHATE 10 MG/ML IJ SOLN
INTRAMUSCULAR | Status: DC | PRN
  Administered 2024-04-08: 8 mg via INTRAVENOUS

## 2024-04-08 MED ORDER — PHENYLEPHRINE HCL-NACL 20-0.9 MG/250ML-% IV SOLN
INTRAVENOUS | Status: AC
  Filled 2024-04-08: qty 250

## 2024-04-08 MED ORDER — PROPOFOL 10 MG/ML IV BOLUS
INTRAVENOUS | Status: DC | PRN
  Administered 2024-04-08: 110 mg via INTRAVENOUS

## 2024-04-08 MED ORDER — ONDANSETRON HCL 4 MG/2ML IJ SOLN
INTRAMUSCULAR | Status: AC
  Filled 2024-04-08: qty 2

## 2024-04-08 MED ORDER — OXYCODONE HCL 5 MG/5ML PO SOLN: 5.0000 mg | Freq: Once | ORAL | Status: AC | PRN

## 2024-04-08 MED ORDER — ORAL CARE MOUTH RINSE: 15.0000 mL | Freq: Once | OROMUCOSAL | Status: AC

## 2024-04-08 MED ORDER — BUPIVACAINE-EPINEPHRINE (PF) 0.25% -1:200000 IJ SOLN
INTRAMUSCULAR | Status: AC
Start: 2024-04-08 — End: 2024-04-08
  Filled 2024-04-08: qty 30

## 2024-04-08 MED ORDER — ACETAMINOPHEN 10 MG/ML IV SOLN
INTRAVENOUS | Status: DC | PRN
  Administered 2024-04-08: 1000 mg via INTRAVENOUS

## 2024-04-08 MED ORDER — PHENYLEPHRINE 80 MCG/ML (10ML) SYRINGE FOR IV PUSH (FOR BLOOD PRESSURE SUPPORT)
PREFILLED_SYRINGE | INTRAVENOUS | Status: DC | PRN
  Administered 2024-04-08: 80 ug via INTRAVENOUS

## 2024-04-08 MED ORDER — ROCURONIUM BROMIDE 10 MG/ML (PF) SYRINGE
PREFILLED_SYRINGE | INTRAVENOUS | Status: DC | PRN
  Administered 2024-04-08: 60 mg via INTRAVENOUS

## 2024-04-08 MED ORDER — LIDOCAINE 2% (20 MG/ML) 5 ML SYRINGE
INTRAMUSCULAR | Status: DC | PRN
  Administered 2024-04-08: 30 mg via INTRAVENOUS

## 2024-04-08 MED ORDER — CEFAZOLIN SODIUM-DEXTROSE 2-4 GM/100ML-% IV SOLN
2.0000 g | INTRAVENOUS | Status: AC
  Administered 2024-04-08: 2 g via INTRAVENOUS
  Filled 2024-04-08: qty 100

## 2024-04-08 MED ORDER — SUCCINYLCHOLINE CHLORIDE 200 MG/10ML IV SOSY
PREFILLED_SYRINGE | INTRAVENOUS | Status: AC
  Filled 2024-04-08: qty 10

## 2024-04-08 MED ORDER — OXYCODONE HCL 5 MG PO TABS
ORAL_TABLET | ORAL | Status: AC
  Filled 2024-04-08: qty 1

## 2024-04-08 MED ORDER — DEXAMETHASONE SODIUM PHOSPHATE 10 MG/ML IJ SOLN
INTRAMUSCULAR | Status: AC
  Filled 2024-04-08: qty 1

## 2024-04-08 MED ORDER — VANCOMYCIN HCL 1000 MG IV SOLR
INTRAVENOUS | Status: AC
  Filled 2024-04-08: qty 20

## 2024-04-08 MED ORDER — BUPIVACAINE HCL (PF) 0.25 % IJ SOLN
INTRAMUSCULAR | Status: DC | PRN
  Administered 2024-04-08: 20 mL

## 2024-04-08 MED ORDER — ROCURONIUM BROMIDE 10 MG/ML (PF) SYRINGE
PREFILLED_SYRINGE | INTRAVENOUS | Status: AC
  Filled 2024-04-08: qty 10

## 2024-04-08 MED ORDER — PHENYLEPHRINE HCL-NACL 20-0.9 MG/250ML-% IV SOLN
INTRAVENOUS | Status: DC | PRN
  Administered 2024-04-08: 20 ug/min via INTRAVENOUS

## 2024-04-08 MED ORDER — HYDROCODONE-ACETAMINOPHEN 5-325 MG PO TABS
ORAL_TABLET | ORAL | Status: AC
  Filled 2024-04-08: qty 1

## 2024-04-08 MED ORDER — HYDROCODONE-ACETAMINOPHEN 5-325 MG PO TABS: 1.0000 | ORAL_TABLET | Freq: Once | ORAL | Status: DC

## 2024-04-08 MED ORDER — CHLORHEXIDINE GLUCONATE 0.12 % MT SOLN
15.0000 mL | Freq: Once | OROMUCOSAL | Status: AC
  Administered 2024-04-08: 15 mL via OROMUCOSAL
  Filled 2024-04-08: qty 15

## 2024-04-08 MED ORDER — LIDOCAINE 2% (20 MG/ML) 5 ML SYRINGE
INTRAMUSCULAR | Status: AC
  Filled 2024-04-08: qty 5

## 2024-04-08 SURGICAL SUPPLY — 52 items
BAG COUNTER SPONGE SURGICOUNT (BAG) ×1 IMPLANT
BANDAGE ESMARK 6X9 LF (GAUZE/BANDAGES/DRESSINGS) ×1 IMPLANT
BNDG COHESIVE 6X5 TAN ST LF (GAUZE/BANDAGES/DRESSINGS) ×1 IMPLANT
BNDG ELASTIC 4X5.8 VLCR STR LF (GAUZE/BANDAGES/DRESSINGS) ×1 IMPLANT
BNDG ELASTIC 6INX 5YD STR LF (GAUZE/BANDAGES/DRESSINGS) ×1 IMPLANT
BNDG GAUZE DERMACEA FLUFF 4 (GAUZE/BANDAGES/DRESSINGS) ×2 IMPLANT
BRUSH SCRUB EZ PLAIN DRY (MISCELLANEOUS) ×2 IMPLANT
CHLORAPREP W/TINT 26 (MISCELLANEOUS) ×1 IMPLANT
COVER SURGICAL LIGHT HANDLE (MISCELLANEOUS) ×2 IMPLANT
CUFF TOURN SGL QUICK 18X4 (TOURNIQUET CUFF) IMPLANT
CUFF TRNQT CYL 24X4X16.5-23 (TOURNIQUET CUFF) IMPLANT
CUFF TRNQT CYL 34X4.125X (TOURNIQUET CUFF) IMPLANT
DERMABOND ADVANCED .7 DNX12 (GAUZE/BANDAGES/DRESSINGS) IMPLANT
DRAPE C-ARM 42X72 X-RAY (DRAPES) IMPLANT
DRAPE C-ARMOR (DRAPES) ×1 IMPLANT
DRAPE U-SHAPE 47X51 STRL (DRAPES) ×1 IMPLANT
DRSG ADAPTIC 3X8 NADH LF (GAUZE/BANDAGES/DRESSINGS) ×1 IMPLANT
DRSG MEPILEX POST OP 4X8 (GAUZE/BANDAGES/DRESSINGS) IMPLANT
ELECTRODE REM PT RTRN 9FT ADLT (ELECTROSURGICAL) ×1 IMPLANT
GAUZE SPONGE 4X4 12PLY STRL (GAUZE/BANDAGES/DRESSINGS) ×1 IMPLANT
GLOVE BIO SURGEON STRL SZ 6.5 (GLOVE) ×3 IMPLANT
GLOVE BIO SURGEON STRL SZ7.5 (GLOVE) ×4 IMPLANT
GLOVE BIOGEL PI IND STRL 6.5 (GLOVE) ×1 IMPLANT
GLOVE BIOGEL PI IND STRL 7.5 (GLOVE) ×1 IMPLANT
GOWN STRL REUS W/ TWL LRG LVL3 (GOWN DISPOSABLE) ×2 IMPLANT
KIT BASIN OR (CUSTOM PROCEDURE TRAY) ×1 IMPLANT
KIT TURNOVER KIT B (KITS) ×1 IMPLANT
MANIFOLD NEPTUNE II (INSTRUMENTS) ×1 IMPLANT
NDL 22X1.5 STRL (OR ONLY) (MISCELLANEOUS) IMPLANT
NEEDLE 22X1.5 STRL (OR ONLY) (MISCELLANEOUS) IMPLANT
NS IRRIG 1000ML POUR BTL (IV SOLUTION) ×1 IMPLANT
PACK ORTHO EXTREMITY (CUSTOM PROCEDURE TRAY) ×1 IMPLANT
PAD ARMBOARD POSITIONER FOAM (MISCELLANEOUS) ×2 IMPLANT
PADDING CAST COTTON 6X4 STRL (CAST SUPPLIES) ×3 IMPLANT
SPONGE T-LAP 18X18 ~~LOC~~+RFID (SPONGE) ×1 IMPLANT
STAPLER SKIN PROX 35W (STAPLE) IMPLANT
STOCKINETTE IMPERVIOUS LG (DRAPES) ×1 IMPLANT
STRIP CLOSURE SKIN 1/2X4 (GAUZE/BANDAGES/DRESSINGS) IMPLANT
SUCTION TUBE FRAZIER 10FR DISP (SUCTIONS) IMPLANT
SUT ETHILON 3 0 PS 1 (SUTURE) IMPLANT
SUT MNCRL AB 3-0 PS2 18 (SUTURE) ×1 IMPLANT
SUT MON AB 2-0 CT1 36 (SUTURE) ×1 IMPLANT
SUT PDS AB 2-0 CT1 27 (SUTURE) IMPLANT
SUT VIC AB 0 CT1 27XBRD ANBCTR (SUTURE) IMPLANT
SUT VIC AB 2-0 CT1 TAPERPNT 27 (SUTURE) IMPLANT
SYR CONTROL 10ML LL (SYRINGE) IMPLANT
TOWEL GREEN STERILE (TOWEL DISPOSABLE) ×2 IMPLANT
TOWEL GREEN STERILE FF (TOWEL DISPOSABLE) ×2 IMPLANT
TUBE CONNECTING 12X1/4 (SUCTIONS) ×1 IMPLANT
UNDERPAD 30X36 HEAVY ABSORB (UNDERPADS AND DIAPERS) ×1 IMPLANT
WATER STERILE IRR 1000ML POUR (IV SOLUTION) ×2 IMPLANT
YANKAUER SUCT BULB TIP NO VENT (SUCTIONS) ×1 IMPLANT

## 2024-04-08 NOTE — Anesthesia Procedure Notes (Signed)
 Procedure Name: Intubation Date/Time: 04/08/2024 7:44 AM  Performed by: Catheline Terrall NOVAK, RNPre-anesthesia Checklist: Patient identified, Emergency Drugs available, Suction available and Patient being monitored Patient Re-evaluated:Patient Re-evaluated prior to induction Oxygen Delivery Method: Circle system utilized Preoxygenation: Pre-oxygenation with 100% oxygen Induction Type: IV induction Ventilation: Mask ventilation without difficulty Laryngoscope Size: Miller and 2 Grade View: Grade I Tube type: Oral Tube size: 7.0 mm Number of attempts: 1 Airway Equipment and Method: Stylet and Oral airway Placement Confirmation: ETT inserted through vocal cords under direct vision, positive ETCO2 and breath sounds checked- equal and bilateral Secured at: 22 cm Tube secured with: Tape Dental Injury: Teeth and Oropharynx as per pre-operative assessment

## 2024-04-08 NOTE — Interval H&P Note (Signed)
 History and Physical Interval Note:  04/08/2024 7:29 AM  Tracey [name]  has presented today for surgery, with the diagnosis of Left distal femur pain.  The various methods of treatment have been discussed with the patient and family. After consideration of risks, benefits and other options for treatment, the patient has consented to  Procedure(s): REMOVAL, HARDWARE FEMUR (Left) as a surgical intervention.  The patient's history has been reviewed, patient examined, no change in status, stable for surgery.  I have reviewed the patient's chart and labs.  Questions were answered to the patient's satisfaction.     Irwin Toran P Zeek Rostron

## 2024-04-08 NOTE — Transfer of Care (Signed)
 Immediate Anesthesia Transfer of Care Note  Patient: Tracey [name]  Procedure(s) Performed: REMOVAL, HARDWARE FEMUR (Left)  Patient Location: PACU  Anesthesia Type:General  Level of Consciousness: awake, alert , and oriented  Airway & Oxygen Therapy: Patient Spontanous Breathing and Patient connected to face mask oxygen  Post-op Assessment: Report given to RN and Post -op Vital signs reviewed and stable  Post vital signs: Reviewed and stable  Last Vitals:  Vitals Value Taken Time  BP 142/60 04/08/24 09:04  Temp    Pulse 59 04/08/24 09:09  Resp 16 04/08/24 09:09  SpO2 98 % 04/08/24 09:09  Vitals shown include unfiled device data.  Last Pain:  Vitals:   04/08/24 0608  TempSrc:   PainSc: 0-No pain         Complications: No notable events documented.

## 2024-04-08 NOTE — Op Note (Signed)
 Orthopaedic Surgery Operative Note (CSN: [id number] ) Date of Surgery: 04/08/2024  Admit Date: 04/08/2024   Diagnoses: Pre-Op Diagnoses: Left distal femur painful orthopaedic hardware  Post-Op Diagnosis: Same  Procedures: CPT 20680-Removal of hardware left femur  Surgeons : Primary: Kendal Franky SQUIBB, MD  Assistant: Lauraine Moores, PA-C  Location: OR 3   Anesthesia: General   Antibiotics: Ancef  2g preop with 1 gm vancomycin  powder placed topically   Tourniquet time: None    Estimated Blood Loss: 75 mL  Complications:* No complications entered in OR log *   Specimens:* No specimens in log *   Implants: * No implants in log *   Indications for Surgery: 88 year old female who sustained a left distal femur fracture and underwent open reduction internal fixation in October 2024.  She subsequently continued to have significant pain around her distal portion of her plate even after the fracture went on to heal.  A CT scan was obtained to show that there was significant callus and healing at that allowed for removal of hardware.  Risks and benefits were discussed with the patient.  Risks include but not limited to bleeding, infection, continued pain, refracture, continued stiffness, nerve or blood vessel injury, DVT, and the possible anesthetic complications.  She agreed to proceed with surgery and consent was obtained.  Operative Findings: Successful removal of hardware of left distal femur without any complication.  Procedure: The patient was identified in the preoperative holding area. Consent was confirmed with the patient and their family and all questions were answered. The operative extremity was marked after confirmation with the patient. she was then brought back to the operating room by our anesthesia colleagues.  She was placed under general anesthetic carefully transferred over to radiolucent flattop table.  A bump was placed under her operative hip.  The left lower extremity  was then prepped and draped in usual sterile fashion.  A timeout was performed to verify the patient, the procedure, and the extremity.  Preoperative antibiotics were dosed.  The hip and knee were flexed over a triangle fluoroscopic imaging showed the hardware that was in place.  I reopened the distal incision and carried it down through skin and subcutaneous tissue.  I incised through the IT band and visualized the plate and remove the locking caps distally.  I then was able to remove the 5.0 millimeter screws without difficulty.  I then proceeded to percutaneously remove the locking caps from the femoral shaft screws and then removed the 5.0 and 4.0 millimeter screws from the femoral shaft.  I then carefully elevated the plate off of the bone and released the soft tissue that had adhered to the plate.  I then carefully removed the plate without significant muscle damage.  Final fluoroscopic imaging was obtained.  The incision was copiously irrigated.  A gram vancomycin  powder was placed into the incision.  A layered closure of 0 Vicryl, 2-0 Monocryl and 3-0 Monocryl was used to close the skin.  Dermabond was used to seal the skin.  Sterile dressing was applied.  The patient was then awoke from anesthesia and taken to the PACU in stable condition.   Post Op Plan/Instructions: The patient be weightbearing as tolerated to the left lower extremity.  She will discharge home from the PACU.  Will have her follow-up in 2 weeks for x-rays and wound check.  I would recommend a walker for ambulation until she follows up with me.  No DVT prophylaxis is needed in this healthy ambulatory patient.  I was present and performed the entire surgery.  Lauraine Moores, PA-C did assist me throughout the case. An assistant was necessary given the difficulty in approach, maintenance of reduction and ability to instrument the fracture.   Franky Light, MD [facility]

## 2024-04-08 NOTE — Discharge Instructions (Signed)
 [facility] Discharge Instructions   General Discharge Instructions  WEIGHT BEARING STATUS:Weightbearing as tolerated left leg with walker  RANGE OF MOTION/ACTIVITY: Ok for hip and knee motion as tolerated  Wound Care: You may remove your surgical dressing on post op day #2 (Friday 02/09/24). Incisions can be left open to air if there is no drainage. Once the incision is completely dry and without drainage, it may be left open to air out.  Showering may begin post op day #3 (Saturday 02/10/24).  Clean incision gently with soap and water.  DVT/PE prophylaxis: Resume your home dose Aspirin  and Plavix  on post op day #1  Diet: as you were eating previously.  Can use over the counter stool softeners and bowel preparations, such as Miralax, to help with bowel movements.  Narcotics can be constipating.  Be sure to drink plenty of fluids  PAIN MEDICATION USE AND EXPECTATIONS  You have likely been given narcotic medications to help control your pain.  After a traumatic event that results in an fracture (broken bone) with or without surgery, it is ok to use narcotic pain medications to help control one's pain.  We understand that everyone responds to pain differently and each individual patient will be evaluated on a regular basis for the continued need for narcotic medications. Ideally, narcotic medication use should last no more than 6-8 weeks (coinciding with fracture healing).   As a patient it is your responsibility as well to monitor narcotic medication use and report the amount and frequency you use these medications when you come to your office visit.   We would also advise that if you are using narcotic medications, you should take a dose prior to therapy to maximize you participation.  IF YOU ARE ON NARCOTIC MEDICATIONS IT IS NOT PERMISSIBLE TO OPERATE A MOTOR VEHICLE (MOTORCYCLE/CAR/TRUCK/MOPED) OR HEAVY MACHINERY DO NOT MIX NARCOTICS WITH OTHER CNS (CENTRAL NERVOUS SYSTEM)  DEPRESSANTS SUCH AS ALCOHOL  POST-OPERATIVE OPIOID TAPER INSTRUCTIONS: It is important to wean off of your opioid medication as soon as possible. If you do not need pain medication after your surgery it is ok to stop day one. Opioids include: Codeine, Hydrocodone (Norco, Vicodin), Oxycodone (Percocet, oxycontin ) and hydromorphone  amongst others.  Long term and even short term use of opiods can cause: Increased pain response Dependence Constipation Depression Respiratory depression And more.  Withdrawal symptoms can include Flu like symptoms Nausea, vomiting And more Techniques to manage these symptoms Hydrate well Eat regular healthy meals Stay active Use relaxation techniques(deep breathing, meditating, yoga) Do Not substitute Alcohol to help with tapering If you have been on opioids for less than two weeks and do not have pain than it is ok to stop all together.  Plan to wean off of opioids This plan should start within one week post op of your fracture surgery  Maintain the same interval or time between taking each dose and first decrease the dose.  Cut the total daily intake of opioids by one tablet each day Next start to increase the time between doses. The last dose that should be eliminated is the evening dose.    STOP SMOKING OR USING NICOTINE PRODUCTS!!!!  As discussed nicotine severely impairs your body's ability to heal surgical and traumatic wounds but also impairs bone healing.  Wounds and bone heal by forming microscopic blood vessels (angiogenesis) and nicotine is a vasoconstrictor (essentially, shrinks blood vessels).  Therefore, if vasoconstriction occurs to these microscopic blood vessels they essentially disappear and are unable to deliver  necessary nutrients to the healing tissue.  This is one modifiable factor that you can do to dramatically increase your chances of healing your injury.  (This means no smoking, no nicotine gum, patches, etc)  DO NOT USE  NONSTEROIDAL ANTI-INFLAMMATORY DRUGS (NSAID'S)  Using products such as Advil  (ibuprofen ), Aleve (naproxen), Motrin  (ibuprofen ) for additional pain control during fracture healing can delay and/or prevent the healing response.  If you would like to take over the counter (OTC) medication, Tylenol  (acetaminophen ) is ok.  However, some narcotic medications that are given for pain control contain acetaminophen  as well. Therefore, you should not exceed more than 4000 mg of tylenol  in a day if you do not have liver disease.  Also note that there are may OTC medicines, such as cold medicines and allergy medicines that my contain tylenol  as well.  If you have any questions about medications and/or interactions please ask your doctor/PA or your pharmacist.      ICE AND ELEVATE INJURED/OPERATIVE EXTREMITY  Using ice and elevating the injured extremity above your heart can help with swelling and pain control.  Icing in a pulsatile fashion, such as 20 minutes on and 20 minutes off, can be followed.    Do not place ice directly on skin. Make sure there is a barrier between to skin and the ice pack.    Using frozen items such as frozen peas works well as the conform nicely to the are that needs to be iced.  USE AN ACE WRAP OR TED HOSE FOR SWELLING CONTROL  In addition to icing and elevation, Ace wraps or TED hose are used to help limit and resolve swelling.  It is recommended to use Ace wraps or TED hose until you are informed to stop.    When using Ace Wraps start the wrapping distally (farthest away from the body) and wrap proximally (closer to the body)   Example: If you had surgery on your leg or thing and you do not have a splint on, start the ace wrap at the toes and work your way up to the thigh        If you had surgery on your upper extremity and do not have a splint on, start the ace wrap at your fingers and work your way up to the upper arm   CALL THE OFFICE FOR MEDICATION REFILLS OR WITH ANY  QUESTIONS/CONCERNS: [phone]   VISIT OUR WEBSITE FOR ADDITIONAL INFORMATION: [email or web address]  Discharge Wound Care Instructions  Do NOT apply any ointments, solutions or lotions to pin sites or surgical wounds.  These prevent needed drainage and even though solutions like hydrogen peroxide kill bacteria, they also damage cells lining the pin sites that help fight infection.  Applying lotions or ointments can keep the wounds moist and can cause them to breakdown and open up as well. This can increase the risk for infection. When in doubt call the office.  Surgical incisions should be dressed daily.  If any drainage is noted, use one layer of adaptic or Mepitel, then gauze, Kerlix, and an ace wrap. - These dressing supplies should be available at local medical supply stores ([facility], [facility], etc) as well as Insurance claims handler ([facility], [facility], [place], etc)  Once the incision is completely dry and without drainage, it may be left open to air out.  Showering may begin 36-48 hours later.  Cleaning gently with soap and water.   Call office for the following: Temperature greater than 101F Persistent nausea and vomiting  Severe uncontrolled pain Redness, tenderness, or signs of infection (pain, swelling, redness, odor or green/yellow discharge around the site) Difficulty breathing, headache or visual disturbances Hives Persistent dizziness or light-headedness Extreme fatigue Any other questions or concerns you may have after discharge  In an emergency, call 911 or go to an Emergency Department at a nearby hospital  OTHER HELPFUL INFORMATION  If you had a block, it will wear off between 8-24 hrs postop typically.  This is period when your pain may go from nearly zero to the pain you would have had postop without the block.  This is an abrupt transition but nothing dangerous is happening.  You may take an extra dose of narcotic when this happens.  You should wean off your  narcotic medicines as soon as you are able.  Most patients will be off or using minimal narcotics before their first postop appointment.   We suggest you use the pain medication the first night prior to going to bed, in order to ease any pain when the anesthesia wears off. You should avoid taking pain medications on an empty stomach as it will make you nauseous.  Do not drink alcoholic beverages or take illicit drugs when taking pain medications.  In most states it is against the law to drive while you are in a splint or sling.  And certainly against the law to drive while taking narcotics.  You may return to work/school in the next couple of days when you feel up to it.   Pain medication may make you constipated.  Below are a few solutions to try in this order: Decrease the amount of pain medication if you aren't having pain. Drink lots of decaffeinated fluids. Drink prune juice and/or each dried prunes  If the first 3 don't work start with additional solutions Take Colace - an over-the-counter stool softener Take Senokot - an over-the-counter laxative Take Miralax - a stronger over-the-counter laxative

## 2024-04-09 ENCOUNTER — Encounter (HOSPITAL_COMMUNITY): Payer: Self-pay | Admitting: Student

## 2024-04-10 NOTE — Anesthesia Postprocedure Evaluation (Signed)
 Anesthesia Post Note  Patient: Tracey [name]  Procedure(s) Performed: REMOVAL, HARDWARE FEMUR (Left)     Patient location during evaluation: PACU Anesthesia Type: General Level of consciousness: awake and alert Pain management: pain level controlled Vital Signs Assessment: post-procedure vital signs reviewed and stable Respiratory status: spontaneous breathing, nonlabored ventilation and respiratory function stable Cardiovascular status: blood pressure returned to baseline and stable Postop Assessment: no apparent nausea or vomiting Anesthetic complications: no   No notable events documented.                  Carisha Kantor

## 2024-04-21 DIAGNOSIS — T8484XD Pain due to internal orthopedic prosthetic devices, implants and grafts, subsequent encounter: Secondary | ICD-10-CM | POA: Diagnosis not present

## 2024-05-04 DIAGNOSIS — K080 Exfoliation of teeth due to systemic causes: Secondary | ICD-10-CM | POA: Diagnosis not present

## 2024-05-06 ENCOUNTER — Other Ambulatory Visit: Payer: Self-pay | Admitting: Cardiovascular Disease

## 2024-05-25 DIAGNOSIS — D3131 Benign neoplasm of right choroid: Secondary | ICD-10-CM | POA: Diagnosis not present

## 2024-05-25 DIAGNOSIS — Z961 Presence of intraocular lens: Secondary | ICD-10-CM | POA: Diagnosis not present

## 2024-05-25 DIAGNOSIS — H43812 Vitreous degeneration, left eye: Secondary | ICD-10-CM | POA: Diagnosis not present

## 2024-05-25 DIAGNOSIS — H35371 Puckering of macula, right eye: Secondary | ICD-10-CM | POA: Diagnosis not present

## 2024-05-25 DIAGNOSIS — E119 Type 2 diabetes mellitus without complications: Secondary | ICD-10-CM | POA: Diagnosis not present

## 2024-05-25 LAB — HM DIABETES EYE EXAM

## 2024-05-26 ENCOUNTER — Encounter: Payer: Self-pay | Admitting: Family Medicine

## 2024-05-26 ENCOUNTER — Ambulatory Visit: Attending: Medical | Admitting: Medical

## 2024-05-26 ENCOUNTER — Encounter: Payer: Self-pay | Admitting: Medical

## 2024-05-26 VITALS — BP 140/62 | HR 59 | Ht 63.0 in | Wt 124.0 lb

## 2024-05-26 DIAGNOSIS — I471 Supraventricular tachycardia, unspecified: Secondary | ICD-10-CM

## 2024-05-26 DIAGNOSIS — I10 Essential (primary) hypertension: Secondary | ICD-10-CM

## 2024-05-26 DIAGNOSIS — I739 Peripheral vascular disease, unspecified: Secondary | ICD-10-CM

## 2024-05-26 DIAGNOSIS — E782 Mixed hyperlipidemia: Secondary | ICD-10-CM

## 2024-05-26 DIAGNOSIS — I25118 Atherosclerotic heart disease of native coronary artery with other forms of angina pectoris: Secondary | ICD-10-CM

## 2024-05-26 DIAGNOSIS — I480 Paroxysmal atrial fibrillation: Secondary | ICD-10-CM

## 2024-05-26 DIAGNOSIS — R0602 Shortness of breath: Secondary | ICD-10-CM | POA: Diagnosis not present

## 2024-05-26 DIAGNOSIS — I6523 Occlusion and stenosis of bilateral carotid arteries: Secondary | ICD-10-CM

## 2024-05-26 DIAGNOSIS — I5032 Chronic diastolic (congestive) heart failure: Secondary | ICD-10-CM

## 2024-05-26 DIAGNOSIS — I700 Atherosclerosis of aorta: Secondary | ICD-10-CM

## 2024-05-26 NOTE — Progress Notes (Unsigned)
 Cardiology Office Note   Date:  05/26/2024  ID:  Tracey [name], DOB 1936-04-09, MRN [id number] PCP: Randeen Laine LABOR, MD  [facility] Providers Cardiologist:  None  History of Present Illness Tracey [name] is a 88 y.o. female with a h/o CAD s/p CABG in 1995 with subsequent stenting, left renal artery stenting, DM2, h/o near syncope, hyperlipidemia, diabetes type 2, SVT.  Patient has a history of CABG in 1995.  She had a stent to her graft to the OM in January 2013 and repeat stenting to the graft to the OM.  She has a history of left renal PCI in 1998 with redo in March 2010 followed by Dr. Marea.  Echo in 2020 showed EF of 60 to 65% mild MR.  Myoview  Lexiscan  in 12/2022 was low risk with normal perfusion, normal LVEF.  Carotid ultrasound November 2024 showed right ICA 1 to 39%, left ICA 40 to 59%.  Heart monitor January 2025 showed normal sinus rhythm, average heart rate of 67 bpm, ventricular bigeminy.  Patient was last seen November 2024 and was stable from a cardiac perspective.  Today, the patient reports shortness of breath.  Patient broke her femur earlier this year and had persistent pain.  They recently went in and took out the rod.  She has been recovering from this.  She feels shortness of breath is not getting better despite doing exercises.  She has chest pain when she moves her arms or lays on 1 side, most likely musculoskeletal.  She denies lower leg edema.  She is using a cane for safety.   Studies Reviewed      *** Risk Assessment/Calculations {Does this patient have ATRIAL FIBRILLATION?[phone]} The patient's 1st BP is elevated (>139/89)*** Repeat BP and {Click to enter a 2nd BP Refresh Note  :1}       Physical Exam VS:  BP (!) 140/62 (BP Location: Left Arm, Patient Position: Sitting, Cuff Size: Normal)   Pulse (!) 59   Ht 5' 3 (1.6 m)   Wt 124 lb (56.2 kg)   SpO2 98%   BMI 21.97 kg/m        Wt Readings from Last 3 Encounters:  05/26/24 124 lb  (56.2 kg)  04/08/24 125 lb (56.7 kg)  03/12/24 125 lb 6.4 oz (56.9 kg)    GEN: Well nourished, well developed in no acute distress NECK: No JVD; No carotid bruits CARDIAC: RRR, no murmurs, rubs, gallops RESPIRATORY:  Clear to auscultation without rales, wheezing or rhonchi  ABDOMEN: Soft, non-tender, non-distended EXTREMITIES:  No edema; No deformity   ASSESSMENT AND PLAN  Shortness of breath CAD s/p remote CABG Patient had a surgery to repair her femur at the beginning of this year with subsequent removal of the hardware in July 2025 for persistent pain.  She feels shortness of breath is not getting better despite exercises.  She has musculoskeletal chest pain.  Patient is euvolemic on exam.  I will start with an echocardiogram.  I will also check CBC, BMP, TSH.  If breathing issues continue, may need ischemic evaluation.  Continue aspirin , Plavix , Coreg , Zetia , lisinopril , sublingual nitro, Crestor .  pSVT Patient denies palpitations.  Continue Coreg  3.125 mg twice daily.  PAD  HLD LDL 51.  Continue Crestor  40 mg daily.  Hypertension Blood pressure today is reasonable.  Continue lisinopril  10 mg daily and Coreg  3.125 mg twice daily.       Dispo: Follow-up in 6-8 weeks  Signed, Kashina Mecum VEAR Fishman, PA-C

## 2024-05-26 NOTE — Patient Instructions (Signed)
 Medication Instructions:  Your physician recommends that you continue on your current medications as directed. Please refer to the Current Medication list given to you today.    *If you need a refill on your cardiac medications before your next appointment, please call your pharmacy*  Lab Work: Your provider would like for you to have following labs drawn today CBC, BMP, TSH.     Testing/Procedures: Your physician has requested that you have an echocardiogram. Echocardiography is a painless test that uses sound waves to create images of your heart. It provides your doctor with information about the size and shape of your heart and how well your heart's chambers and valves are working.   You may receive an ultrasound enhancing agent through an IV if needed to better visualize your heart during the echo. This procedure takes approximately one hour.  There are no restrictions for this procedure.  This will take place at [address] [facility], [place] [address]  Please note: We ask at that you not bring children with you during ultrasound (echo/ vascular) testing. Due to room size and safety concerns, children are not allowed in the ultrasound rooms during exams. Our front office staff cannot provide observation of children in our lobby area while testing is being conducted. An adult accompanying a patient to their appointment will only be allowed in the ultrasound room at the discretion of the ultrasound technician under special circumstances. We apologize for any inconvenience.   Follow-Up: At [facility], you and your health needs are our priority.  As part of our continuing mission to provide you with exceptional heart care, our providers are all part of one team.  This team includes your primary Cardiologist (physician) and Advanced Practice Providers or APPs (Physician Assistants and Nurse Practitioners) who all work together to provide you with the care you  need, when you need it.  Your next appointment:   6-8 week(s)  Provider:   Mikey Fishman, PA-C

## 2024-05-27 ENCOUNTER — Ambulatory Visit: Payer: Self-pay | Admitting: Medical

## 2024-05-27 LAB — BASIC METABOLIC PANEL WITH GFR
BUN/Creatinine Ratio: 22 (ref 12–28)
BUN: 25 mg/dL (ref 8–27)
CO2: 21 mmol/L (ref 20–29)
Calcium: 11 mg/dL — ABNORMAL HIGH (ref 8.7–10.3)
Chloride: 104 mmol/L (ref 96–106)
Creatinine, Ser: 1.14 mg/dL — ABNORMAL HIGH (ref 0.57–1.00)
Glucose: 132 mg/dL — ABNORMAL HIGH (ref 70–99)
Potassium: 4.8 mmol/L (ref 3.5–5.2)
Sodium: 140 mmol/L (ref 134–144)
eGFR: 47 mL/min/1.73 — ABNORMAL LOW (ref >59)

## 2024-05-27 LAB — CBC
Hematocrit: 36.4 % (ref 34.0–46.6)
Hemoglobin: 11.8 g/dL (ref 11.1–15.9)
MCH: 29.8 pg (ref 26.6–33.0)
MCHC: 32.4 g/dL (ref 31.5–35.7)
MCV: 92 fL (ref 79–97)
Platelets: 160 x10E3/uL (ref 150–450)
RBC: 3.96 x10E6/uL (ref 3.77–5.28)
RDW: 12 % (ref 11.7–15.4)
WBC: 6.3 x10E3/uL (ref 3.4–10.8)

## 2024-05-27 LAB — TSH: TSH: 1.93 u[IU]/mL (ref 0.450–4.500)

## 2024-06-29 ENCOUNTER — Encounter: Payer: Self-pay | Admitting: Nurse Practitioner

## 2024-06-29 ENCOUNTER — Ambulatory Visit (INDEPENDENT_AMBULATORY_CARE_PROVIDER_SITE_OTHER): Admitting: Nurse Practitioner

## 2024-06-29 VITALS — BP 118/74 | HR 60 | Ht 63.0 in | Wt 125.0 lb

## 2024-06-29 DIAGNOSIS — Z7984 Long term (current) use of oral hypoglycemic drugs: Secondary | ICD-10-CM

## 2024-06-29 DIAGNOSIS — E1122 Type 2 diabetes mellitus with diabetic chronic kidney disease: Secondary | ICD-10-CM | POA: Diagnosis not present

## 2024-06-29 DIAGNOSIS — E782 Mixed hyperlipidemia: Secondary | ICD-10-CM | POA: Diagnosis not present

## 2024-06-29 DIAGNOSIS — N182 Chronic kidney disease, stage 2 (mild): Secondary | ICD-10-CM

## 2024-06-29 DIAGNOSIS — I10 Essential (primary) hypertension: Secondary | ICD-10-CM

## 2024-06-29 DIAGNOSIS — E559 Vitamin D deficiency, unspecified: Secondary | ICD-10-CM | POA: Diagnosis not present

## 2024-06-29 LAB — POCT GLYCOSYLATED HEMOGLOBIN (HGB A1C): Hemoglobin A1C: 8.7 % — AB (ref 4.0–5.6)

## 2024-06-29 NOTE — Progress Notes (Signed)
 06/29/2024, 11:17 AM  Endocrinology follow-up note   Subjective:    Patient ID: Tracey [name], female    DOB: Nov 16, 1935.  Tracey [name] is being seen  in follow-up after she was seen in consultation for management of currently uncontrolled symptomatic diabetes requested by  Randeen Laine LABOR, MD.   Past Medical History:  Diagnosis Date   Anxiety    Arthritis    Atrial fibrillation (HCC)    history   Chronic kidney disease    ckd stage II   Coronary artery disease    Diabetes mellitus    non insulin  dependent   Dysrhythmia    atrial fibrillation, history of   GERD (gastroesophageal reflux disease)    Heart murmur    History of kidney stones    Hyperlipidemia    Hypertension    Peripheral vascular disease    Rectocele    Syncope 09/2019   UTI (urinary tract infection)     Past Surgical History:  Procedure Laterality Date   ABDOMINAL AORTA STENT     APPENDECTOMY     BREAST BIOPSY Left    20 years ago   BREAST BIOPSY Right 2008   BREAST EXCISIONAL BIOPSY     rt 2008 lt 20 years ago   CARDIAC CATHETERIZATION     CATARACT EXTRACTION W/PHACO Left 08/15/2015   Procedure: CATARACT EXTRACTION PHACO AND INTRAOCULAR LENS PLACEMENT (IOC);  Surgeon: Steven Dingeldein, MD;  Location: [facility] ORS;  Service: Ophthalmology;  Laterality: Left;  US  01:28 AP% 26.4 CDE 35.73 fluid pack # [id number]   COLONOSCOPY WITH PROPOFOL  N/A 01/16/2016   Procedure: COLONOSCOPY WITH PROPOFOL ;  Surgeon: Lamar ONEIDA Holmes, MD;  Location: [facility] ENDOSCOPY;  Service: Endoscopy;  Laterality: N/A;   CORONARY ANGIOPLASTY WITH STENT PLACEMENT     CORONARY ARTERY BYPASS GRAFT  1995   with LIMA  to the LAD, SVG to OM1, SVG to PDA   ESOPHAGOGASTRODUODENOSCOPY (EGD) WITH PROPOFOL  N/A 01/16/2016   Procedure: ESOPHAGOGASTRODUODENOSCOPY (EGD) WITH PROPOFOL ;  Surgeon: Lamar ONEIDA Holmes, MD;  Location: [facility] ENDOSCOPY;  Service:  Endoscopy;  Laterality: N/A;   EYE SURGERY     HARDWARE REMOVAL Left 04/08/2024   Procedure: REMOVAL, HARDWARE FEMUR;  Surgeon: Kendal Franky SQUIBB, MD;  Location: [facility] OR;  Service: Orthopedics;  Laterality: Left;   JOINT REPLACEMENT Right    KNEE ARTHROPLASTY Left 01/06/2020   Procedure: COMPUTER ASSISTED TOTAL KNEE ARTHROPLASTY;  Surgeon: Mardee Lynwood SQUIBB, MD;  Location: [facility] ORS;  Service: Orthopedics;  Laterality: Left;   LOWER EXTREMITY ANGIOGRAPHY N/A 07/08/2019   Procedure: Abdominal Aortagraphy;  Surgeon: Marea Selinda RAMAN, MD;  Location: [facility] INVASIVE CV LAB;  Service: Cardiovascular;  Laterality: N/A;   ORIF FEMUR FRACTURE Left 07/01/2023   Procedure: OPEN REDUCTION INTERNAL FIXATION (ORIF) DISTAL FEMUR FRACTURE;  Surgeon: Kendal Franky SQUIBB, MD;  Location: [facility] OR;  Service: Orthopedics;  Laterality: Left;   RENAL ANGIOGRAPHY Left 08/26/2019   Procedure: RENAL ANGIOGRAPHY;  Surgeon: Marea Selinda RAMAN, MD;  Location: [facility] INV[facility]IVE CV LAB;  Service: Cardiovascular;  Laterality: Left;   RENAL ARTERY STENT     left, By  Dr Primus   REPLACEMENT TOTAL KNEE  03/25/2013   right   TONSILLECTOMY      Social History   Socioeconomic History   Marital status: Married    Spouse name: Not on file   Number of children: Not on file   Years of education: Not on file   Highest education level: Not on file  Occupational History   Occupation: school cafeteria    Comment: retired  Tobacco Use   Smoking status: Never   Smokeless tobacco: Never  Vaping Use   Vaping status: Never Used  Substance and Sexual Activity   Alcohol use: No   Drug use: No   Sexual activity: Never  Other Topics Concern   Not on file  Social History Narrative   Patient has 3 daughters. Remarried June 2021.   Social Drivers of Corporate investment banker Strain: Low Risk  (08/23/2023)   Overall Financial Resource Strain (CARDIA)    Difficulty of Paying Living Expenses: Not hard at all  Food Insecurity: No Food Insecurity (08/23/2023)    Hunger Vital Sign    Worried About Running Out of Food in the Last Year: Never true    Ran Out of Food in the Last Year: Never true  Transportation Needs: No Transportation Needs (08/23/2023)   PRAPARE - Administrator, Civil Service (Medical): No    Lack of Transportation (Non-Medical): No  Physical Activity: Insufficiently Active (08/23/2023)   Exercise Vital Sign    Days of Exercise per Week: 4 days    Minutes of Exercise per Session: 30 min  Stress: No Stress Concern Present (08/23/2023)   [employer] of Occupational Health - Occupational Stress Questionnaire    Feeling of Stress : Only a little  Social Connections: Socially Integrated (08/23/2023)   Social Connection and Isolation Panel    Frequency of Communication with Friends and Family: More than three times a week    Frequency of Social Gatherings with Friends and Family: More than three times a week    Attends Religious Services: More than 4 times per year    Active Member of [employer] or Organizations: Yes    Attends Engineer, structural: More than 4 times per year    Marital Status: Married    Family History  Problem Relation Age of Onset   Heart disease Father    Other Brother        open heart surgery   Cancer Brother    Other Brother        open heart surgery   Other Brother        open heart surgery   Cancer Daughter    Hyperlipidemia Daughter    Breast cancer Neg Hx    Kidney cancer Neg Hx    Bladder Cancer Neg Hx     Outpatient Encounter Medications as of 06/29/2024  Medication Sig   aspirin  EC 81 MG tablet Take 81 mg by mouth daily. Swallow whole.   carvedilol  (COREG ) 3.125 MG tablet TAKE ONE TABLET BY MOUTH ONCE DAILY   Cholecalciferol (VITAMIN D3) 1.25 MG (50000 UT) CAPS Take 5,000 Units by mouth daily.   clopidogrel  (PLAVIX ) 75 MG tablet TAKE ONE TABLET BY MOUTH ONCE DAILY   CRANBERRY PO Take 1 tablet by mouth daily.   cyanocobalamin  1000 MCG tablet Take 1 tablet (1,000 mcg  total) by mouth daily.   ezetimibe  (ZETIA ) 10 MG tablet TAKE ONE TABLET BY MOUTH ONCE DAILY   glipiZIDE  (GLUCOTROL   XL) 5 MG 24 hr tablet Take 1 tablet (5 mg total) by mouth daily with breakfast.   glucose blood (FREESTYLE LITE) test strip check blood sugar TWICE DAILY   hydroxypropyl methylcellulose / hypromellose (ISOPTO TEARS / GONIOVISC) 2.5 % ophthalmic solution Place 1 drop into both eyes daily as needed for dry eyes.   lisinopril  (ZESTRIL ) 10 MG tablet TAKE ONE TABLET BY MOUTH ONCE DAILY   metFORMIN  (GLUCOPHAGE -XR) 500 MG 24 hr tablet TAKE ONE TABLET BY MOUTH ONCE DAILY   Multiple Vitamins-Minerals (MULTIVITAMIN WITH MINERALS) tablet Take 1 tablet by mouth daily.   nitroGLYCERIN  (NITROSTAT ) 0.4 MG SL tablet Place 1 tablet (0.4 mg total) under the tongue every 5 (five) minutes as needed for chest pain.   OneTouch Delica Lancets 33G MISC USE TO TEST TWICE A DAY   pantoprazole  (PROTONIX ) 40 MG tablet TAKE ONE TABLET BY MOUTH EVERY OTHER DAY   polyethylene glycol (MIRALAX / GLYCOLAX) 17 g packet Take 8.5 g by mouth daily as needed for mild constipation or moderate constipation.   rosuvastatin  (CRESTOR ) 40 MG tablet TAKE ONE TABLET BY MOUTH ONCE DAILY   [DISCONTINUED] HYDROcodone -acetaminophen  (NORCO/VICODIN) 5-325 MG tablet Take 1 tablet by mouth every 6 (six) hours as needed for severe pain (pain score 7-10). (Patient not taking: Reported on 06/29/2024)   [DISCONTINUED] senna-docusate (SENOKOT-S) 8.6-50 MG tablet Take 2 tablets by mouth 2 (two) times daily between meals as needed for mild constipation. (Patient not taking: Reported on 06/29/2024)   No facility-administered encounter medications on file as of 06/29/2024.    ALLERGIES: Allergies  Allergen Reactions   Ferrous Sulfate  Anaphylaxis and Rash   Codeine Nausea Only    Sick to her stomach   Erythromycin Nausea And Vomiting and Other (See Comments)   Tramadol  Nausea Only and Other (See Comments)    Loss of appetite and did not eat    Ciprofloxacin Diarrhea    Upsets stomach    VACCINATION STATUS: Immunization History  Administered Date(s) Administered   Fluad Quad(high Dose 65+) 06/15/2019, 09/26/2020   INFLUENZA, HIGH DOSE SEASONAL PF 11/05/2022   Influenza Split 08/10/2015   Influenza,inj,Quad PF,6+ Mos 05/24/2016, 07/26/2017, 06/03/2018, 07/11/2021   Influenza-Unspecified 08/10/2015, 05/24/2016   Moderna Sars-Covid-2 Vaccination 10/02/2019, 10/30/2019, 08/01/2020   Pneumococcal Conjugate-13 11/05/2016   Pneumococcal Polysaccharide-23 12/11/2017   Td 03/17/2013    Diabetes She presents for her follow-up diabetic visit. She has type 2 diabetes mellitus. Onset time: She was diagnosed at approximate age of 89 years. Her disease course has been stable. There are no hypoglycemic associated symptoms. Pertinent negatives for hypoglycemia include no confusion, pallor or seizures. Associated symptoms include fatigue. Pertinent negatives for diabetes include no polydipsia, no polyphagia, no polyuria and no weight loss. There are no hypoglycemic complications. Symptoms are stable. Diabetic complications include heart disease and nephropathy. Risk factors for coronary artery disease include dyslipidemia, diabetes mellitus, hypertension, sedentary lifestyle and post-menopausal. Current diabetic treatment includes oral agent (dual therapy). She is compliant with treatment all of the time. Her weight is fluctuating minimally. She is following a generally healthy diet. Meal planning includes avoidance of concentrated sweets and ADA exchanges. She participates in exercise daily. Her home blood glucose trend is decreasing steadily. Her breakfast blood glucose range is generally 110-130 mg/dl. (She presents today with no meter or logs to review (accidentally forgot them at home).  Her POCT A1c today is 8.7% unchanged from last visit.  She denies any hypoglycemia- did check glucose when she was symptomatic and notes the reading  was in the  80s (and she had eaten later than usual).  She did have surgery the end of July to remove hardware in left leg, at which time her glucose was more elevated than usual (potentially reading into this A1c today).) An ACE inhibitor/angiotensin II receptor blocker is being taken. She does not see a podiatrist.Eye exam is current.    Review of systems  Constitutional: + Minimally fluctuating body weight,  current Body mass index is 22.14 kg/m. , no fatigue, no subjective hyperthermia, no subjective hypothermia Eyes: no blurry vision, no xerophthalmia ENT: no sore throat, no nodules palpated in throat, no dysphagia/odynophagia, no hoarseness Cardiovascular: no chest pain, no shortness of breath, no palpitations, no leg swelling Respiratory: no cough, no shortness of breath Gastrointestinal: no nausea/vomiting/diarrhea Musculoskeletal: no muscle/joint aches Skin: no rashes, no hyperemia Neurological: no tremors, no numbness, no tingling, no dizziness Psychiatric: no depression, no anxiety   Objective:    BP 118/74 (BP Location: Left Arm, Patient Position: Sitting, Cuff Size: Large)   Pulse 60   Ht 5' 3 (1.6 m)   Wt 125 lb (56.7 kg)   BMI 22.14 kg/m   Wt Readings from Last 3 Encounters:  06/29/24 125 lb (56.7 kg)  05/26/24 124 lb (56.2 kg)  04/08/24 125 lb (56.7 kg)    BP Readings from Last 3 Encounters:  06/29/24 118/74  05/26/24 (!) 140/62  04/08/24 (!) 146/47     Physical Exam- Limited  Constitutional:  Body mass index is 22.14 kg/m. , not in acute distress, normal state of mind Eyes:  EOMI, no exophthalmos Musculoskeletal: no gross deformities, strength intact in all four extremities, no gross restriction of joint movements Skin:  no rashes, no hyperemia Neurological: no tremor with outstretched hands   Diabetic Foot Exam - Simple   No data filed      CMP ( most recent) CMP     Component Value Date/Time   NA 140 05/26/2024 1601   NA 137 03/27/2013 0545   K 4.8  05/26/2024 1601   K 3.9 03/27/2013 0545   CL 104 05/26/2024 1601   CL 103 03/27/2013 0545   CO2 21 05/26/2024 1601   CO2 27 03/27/2013 0545   GLUCOSE 132 (H) 05/26/2024 1601   GLUCOSE 133 (H) 04/08/2024 0552   GLUCOSE 122 (H) 03/27/2013 0545   BUN 25 05/26/2024 1601   BUN 10 03/27/2013 0545   CREATININE 1.14 (H) 05/26/2024 1601   CREATININE 1.08 (H) 06/19/2023 0922   CALCIUM  11.0 (H) 05/26/2024 1601   CALCIUM  8.9 03/27/2013 0545   PROT 6.7 11/12/2023 0925   PROT 7.6 10/07/2011 1316   ALBUMIN 4.4 11/12/2023 0925   ALBUMIN 4.5 10/07/2011 1316   AST 28 11/12/2023 0925   AST 26 10/07/2011 1316   ALT 22 11/12/2023 0925   ALT 26 10/07/2011 1316   ALKPHOS 80 11/12/2023 0925   ALKPHOS 46 (L) 10/07/2011 1316   BILITOT 0.6 11/12/2023 0925   BILITOT 0.5 10/07/2011 1316   GFRNONAA 53 (L) 04/08/2024 0552   GFRNONAA 54 (L) 03/03/2020 0000   GFRAA 63 03/03/2020 0000     Diabetic Labs (most recent): Lab Results  Component Value Date   HGBA1C 8.7 (A) 06/29/2024   HGBA1C 8.7 (A) 03/12/2024   HGBA1C 8.8 (A) 12/03/2023   MICROALBUR 80 mg/L 03/12/2024   MICROALBUR 30mg /L 04/01/2023   MICROALBUR 30 04/26/2021     Lipid Panel ( most recent) Lipid Panel     Component Value Date/Time  CHOL 128 11/12/2023 0925   CHOL 158 12/12/2020 1115   CHOL 141 10/08/2011 0457   TRIG 141.0 11/12/2023 0925   TRIG 179 10/08/2011 0457   HDL 48.50 11/12/2023 0925   HDL 52 12/12/2020 1115   HDL 37 (L) 10/08/2011 0457   CHOLHDL 3 11/12/2023 0925   VLDL 28.2 11/12/2023 0925   VLDL 36 10/08/2011 0457   LDLCALC 51 11/12/2023 0925   LDLCALC 70 04/24/2021 1043   LDLCALC 68 10/08/2011 0457      Lab Results  Component Value Date   TSH 1.930 05/26/2024   TSH 2.90 11/12/2023   TSH 1.78 11/13/2022   TSH 2.44 11/01/2021   TSH 1.94 06/07/2021   TSH 1.99 04/24/2021   TSH 1.66 03/03/2020   TSH 2.68 03/05/2019   TSH 2.96 11/06/2017   TSH 1.24 02/01/2017   FREET4 1.4 04/24/2021   FREET4 1.4  03/03/2020      Assessment & Plan:   1) Type 2 diabetes complicated by stage 2 CKD  - Bradley M Lantis has currently controlled asymptomatic type 2 DM since  88 years of age.  She presents today with no meter or logs to review (accidentally forgot them at home).  Her POCT A1c today is 8.7% unchanged from last visit.  She denies any hypoglycemia- did check glucose when she was symptomatic and notes the reading was in the 80s (and she had eaten later than usual).  She did have surgery the end of July to remove hardware in left leg, at which time her glucose was more elevated than usual (potentially reading into this A1c today).  -recent labs reviewed. POCT UM today was normal.  - I had a long discussion with her about the progressive nature of diabetes and the pathology behind its complications. -her diabetes is complicated by stage 2 CKD and she remains at a high risk for more acute and chronic complications which include CAD, CVA, CKD, retinopathy, and neuropathy. These are all discussed in detail with her.  The following Lifestyle Medicine recommendations according to American College of Lifestyle Medicine [facility]) were discussed and offered to patient and she agrees to start the journey:  A. Whole Foods, Plant-based plate comprising of fruits and vegetables, plant-based proteins, whole-grain carbohydrates was discussed in detail with the patient.   A list for source of those nutrients were also provided to the patient.  Patient will use only water or unsweetened tea for hydration. B.  The need to stay away from risky substances including alcohol, smoking; obtaining 7 to 9 hours of restorative sleep, at least 150 minutes of moderate intensity exercise weekly, the importance of healthy social connections,  and stress reduction techniques were discussed. C.  A full color page of  Calorie density of various food groups per pound showing examples of each food groups was provided to the patient.  -  Nutritional counseling repeated at each appointment due to patients tendency to fall back in to old habits.  - The patient admits there is a room for improvement in their diet and drink choices. -  Suggestion is made for the patient to avoid simple carbohydrates from their diet including Cakes, Sweet Desserts / Pastries, Ice Cream, Soda (diet and regular), Sweet Tea, Candies, Chips, Cookies, Sweet Pastries, Store Bought Juices, Alcohol in Excess of 1-2 drinks a day, Artificial Sweeteners, Coffee Creamer, and Sugar-free Products. This will help patient to have stable blood glucose profile and potentially avoid unintended weight gain.   - I encouraged the  patient to switch to unprocessed or minimally processed complex starch and increased protein intake (animal or plant source), fruits, and vegetables.   - Patient is advised to stick to a routine mealtimes to eat 3 meals a day and avoid unnecessary snacks (to snack only to correct hypoglycemia).  - I have approached her with the following individualized plan to manage  her diabetes and patient agrees:   -She is advised to continue Metformin  500 mg ER daily with breakfast and Glipizide  5 mg XL daily at breakfast.  We did talk about potentially adding basal insulin  if A1c does not continue to improve on subsequent visits.  -She is encouraged to continue monitoring blood glucose at least once daily, before breakfast and call the clinic if she gets readings less than 70 or greater than 200 for 3 tests in a row.  - she is not a candidate for full dose Metformin  due to concurrent renal insufficiency.  - she is not a candidate for incretin therapy due to her body habitus.    She could not afford copay for Januvia , or Jardiance.  - Patient specific target  A1c;  LDL, HDL, Triglycerides, and  Waist Circumference were discussed in detail.  2) Blood Pressure /Hypertension:  Her blood pressure is controlled to target.  She is advised to continue current  medications as prescribed by her PCP.  3) Lipids/Hyperlipidemia: Her most recent lipid panel from 11/12/23 shows controlled LDL at 51.  She is advised to continue Crestor  40 mg po daily at bedtime.  Side effects and precautions discussed with her.    4)  Weight/Diet: Her Body mass index is 22.14 kg/m.  -She is not a candidate for weight loss.  Exercise, and detailed carbohydrates information provided  -  detailed on discharge instructions.  5) Chronic Care/Health Maintenance: -she is on ACEI/ARB and Statin medications and  is encouraged to initiate and continue to follow up with Ophthalmology, Dentist,  Podiatrist at least yearly or according to recommendations, and advised to   stay away from smoking. I have recommended yearly flu vaccine and pneumonia vaccine at least every 5 years; moderate intensity exercise for up to 150 minutes weekly; and  sleep for at least 7 hours a day.  - she is advised to maintain close follow up with [place], Laine LABOR, MD for primary care needs, as well as her other providers for optimal and coordinated care.     I spent  48  minutes in the care of the patient today including review of labs from CMP, Lipids, Thyroid  Function, Hematology (current and previous including abstractions from other facilities); face-to-face time discussing  her blood glucose readings/logs, discussing hypoglycemia and hyperglycemia episodes and symptoms, medications doses, her options of short and long term treatment based on the latest standards of care / guidelines;  discussion about incorporating lifestyle medicine;  and documenting the encounter. Risk reduction counseling performed per USPSTF guidelines to reduce obesity and cardiovascular risk factors.     Please refer to Patient Instructions for Blood Glucose Monitoring and Insulin /Medications Dosing Guide  in media tab for additional information. Please  also refer to  Patient Self Inventory in the Media  tab for reviewed elements of  pertinent patient history.  Donia HERO Pun participated in the discussions, expressed understanding, and voiced agreement with the above plans.  All questions were answered to her satisfaction. she is encouraged to contact clinic should she have any questions or concerns prior to her return visit.    Follow  up plan: - Return in about 3 months (around 09/29/2024) for Diabetes F/U with A1c in office, No previsit labs, Bring meter and logs.  Benton Rio, [address] [facility] [address] [place], [place] [address] Phone: [phone] Fax: [phone]  06/29/2024, 11:17 AM

## 2024-07-06 ENCOUNTER — Ambulatory Visit: Attending: Medical

## 2024-07-06 DIAGNOSIS — R0602 Shortness of breath: Secondary | ICD-10-CM

## 2024-07-06 LAB — ECHOCARDIOGRAM COMPLETE
AR max vel: 1.42 cm2
AV Area VTI: 1.38 cm2
AV Area mean vel: 1.27 cm2
AV Mean grad: 4 mmHg
AV Peak grad: 6.1 mmHg
Ao pk vel: 1.23 m/s
Area-P 1/2: 4.15 cm2
S' Lateral: 2.2 cm

## 2024-07-07 ENCOUNTER — Ambulatory Visit: Attending: Medical | Admitting: Medical

## 2024-07-07 ENCOUNTER — Encounter: Payer: Self-pay | Admitting: Medical

## 2024-07-07 VITALS — BP 140/50 | HR 59 | Ht 63.0 in | Wt 125.4 lb

## 2024-07-07 DIAGNOSIS — I471 Supraventricular tachycardia, unspecified: Secondary | ICD-10-CM | POA: Diagnosis not present

## 2024-07-07 DIAGNOSIS — R0602 Shortness of breath: Secondary | ICD-10-CM | POA: Diagnosis not present

## 2024-07-07 DIAGNOSIS — I10 Essential (primary) hypertension: Secondary | ICD-10-CM

## 2024-07-07 DIAGNOSIS — I6523 Occlusion and stenosis of bilateral carotid arteries: Secondary | ICD-10-CM

## 2024-07-07 DIAGNOSIS — I5032 Chronic diastolic (congestive) heart failure: Secondary | ICD-10-CM

## 2024-07-07 DIAGNOSIS — I25118 Atherosclerotic heart disease of native coronary artery with other forms of angina pectoris: Secondary | ICD-10-CM

## 2024-07-07 DIAGNOSIS — E782 Mixed hyperlipidemia: Secondary | ICD-10-CM

## 2024-07-07 NOTE — Progress Notes (Signed)
 Cardiology Office Note   Date:  07/07/2024  ID:  Tracey [name], Tracey [name] 02/11/1936, MRN [id number] PCP: Randeen Laine LABOR, MD  [facility] Providers Cardiologist:  None   History of Present Illness Tracey [name] is a 88 y.o. female with a h/o CAD s/p CABG in 1995 with subsequent stenting, left renal artery stenting, carotid artery disease, DM2, h/o near syncope, hyperlipidemia, diabetes type 2, SVT.   Patient has a history of CABG in 1995.  She had a stent to her graft to the OM in January 2013 and ?repeat stenting to the graft to the OM.   She has a history of left renal PCI in 1998 with redo in March 2010 followed by Dr. Marea.   Carotid Dopplers in 2024 showed 1 to 39% the right ICA, 40 to 59% in the left ICA.   Echo in 2020 showed EF of 60 to 65% mild MR.  Myoview  Lexiscan  in 12/2022 was low risk with normal perfusion, normal LVEF.  Carotid ultrasound November 2024 showed right ICA 1 to 39%, left ICA 40 to 59%.  Heart monitor January 2025 showed normal sinus rhythm, average heart rate of 67 bpm, ventricular bigeminy.   Patient was last seen 05/26/2024 reporting shortness of breath.  She had a recent orthopedic surgery and felt her breathing was not improving.  She reported musculoskeletal chest pain.  Echocardiogram showed LVEF 55 to 60%, grade 1 diastolic dysfunction, moderately dilated left atrium, mild MR.  Today, the patient reports persistent shortness of breath. Just walking in from the parking lot she felt short of breath. She feels femur is mostly healed. Says prior to the incident she was able to walk 1-2 miles a day and can barely walk to the mailbox before feeling short of breath.   Studies Reviewed      Echo 06/2024 1. Left ventricular ejection fraction, by estimation, is 55 to 60%. Left  ventricular ejection fraction by PLAX is 82 %. The left ventricle has  normal function. The left ventricle has no regional wall motion  abnormalities. Left ventricular diastolic   parameters are consistent with Grade I diastolic dysfunction (impaired  relaxation). The average left ventricular global longitudinal strain is  -19.5 %. The global longitudinal strain is normal.   2. Right ventricular systolic function is normal. The right ventricular  size is normal.   3. Left atrial size was moderately dilated.   4. The mitral valve is degenerative. Mild mitral valve regurgitation.   5. The aortic valve is tricuspid. Aortic valve regurgitation is not  visualized. Aortic valve sclerosis/calcification is present, without any  evidence of aortic stenosis.   6. The inferior vena cava is normal in size with greater than 50%  respiratory variability, suggesting right atrial pressure of 3 mmHg.   Heart monitor 09/2023 Event monitor Patch Wear Time:  13 days and 22 hours (2024-12-23T13:36:02-499 to 2025-01-06T12:04:27-0500)   Normal sinus rhythm Patient had a min HR of 46 bpm, max HR of 129 bpm, and avg HR of 67 bpm.    Isolated SVEs were occasional (4.4%, 57593), SVE Couplets were rare (<1.0%, 762), and SVE Triplets were rare (<1.0%, 69).  Isolated VEs were rare (<1.0%), and no VE Couplets or VE Triplets were present.  Ventricular Bigeminy was present.   No patient triggered events recorded   Carotid US  07/2023 Summary:  Right Carotid: Velocities in the right ICA are consistent with a 1-39%  stenosis.   Left Carotid: Velocities in the left ICA are  consistent with a 40-59%  stenosis.   Vertebrals: Bilateral vertebral arteries demonstrate antegrade flow.  Subclavians: Normal flow hemodynamics were seen in bilateral subclavian               arteries.   *See table(s) above for measurements and observations.  Suggest follow up study in 12 months.    MPI 12/2022   The study is normal. The study is low risk.   No ST deviation was noted.   LV perfusion is normal. There is no evidence of ischemia. There is no evidence of infarction.   Left ventricular function is  normal. End diastolic cavity size is normal. End systolic cavity size is normal.   CT attenuation images show severe aortic and coronary artery calcifications.   Echo 12/2018 1. The left ventricle has normal systolic function with an ejection  fraction of 60-65%. The cavity size was normal. Left ventricular diastolic  Doppler parameters are consistent with impaired relaxation.   2. The right ventricle has normal systolic function. The cavity was  normal. There is no increase in right ventricular wall thickness.Mildly  elevated RVSP.   3. Left atrial size was mildly dilated.   4. Mitral valve regurgitation is mild to moderate by color flow Doppler.      Physical Exam VS:  BP (!) 140/50 (BP Location: Left Arm, Patient Position: Sitting, Cuff Size: Normal)   Pulse (!) 59   Ht 5' 3 (1.6 m)   Wt 125 lb 6.4 oz (56.9 kg)   SpO2 95%   BMI 22.21 kg/m        Wt Readings from Last 3 Encounters:  07/07/24 125 lb 6.4 oz (56.9 kg)  06/29/24 125 lb (56.7 kg)  05/26/24 124 lb (56.2 kg)    GEN: Well nourished, well developed in no acute distress NECK: No JVD; No carotid bruits CARDIAC: RRR, no murmurs, rubs, gallops RESPIRATORY:  Clear to auscultation without rales, wheezing or rhonchi  ABDOMEN: Soft, non-tender, non-distended EXTREMITIES:  No edema; No deformity   ASSESSMENT AND PLAN  Shortness of breath CAD s/p CABG The patient underwent surgery to repair her femur at the beginning of the year with subsequent removal of the hardware in July 2025 due to persistent pain.  Since that time she has felt short of breath on exertion and feels it has not been getting better.  Patient is euvolemic on exam.  Echocardiogram showed normal pump function, grade 1 diastolic dysfunction, mild MR.  Labs were unremarkable.  We discussed an ischemic evaluation, but patient would like to wait at this time.  We will continue aspirin , Plavix , Coreg , Zetia , lisinopril , sublingual nitroglycerin , Crestor .  We will  readdress at follow-up.  HFpEF Echo in 2020 showed LVEF 60-65%, G1DD, mild MR. Patient is euvolemic on exam.   pSVT Patient denies palpitations. Continue Coreg  3.125mg BID.   HLD LDL 51. Continue Crestor  40mg  daily.   HTN BP is reasonable. Continue Lisinopril  10mg  daily and Coreg  3.125mg BID.  Carotid artery disease US  in 2024 showed 1-39% in the right ICA and left ICA with 40-59% stenosis.        Dispo: Follow-up in 3 months with MD  Signed, Kahle Mcqueen VEAR Fishman, PA-C

## 2024-07-07 NOTE — Patient Instructions (Signed)
 Medication Instructions:  Your physician recommends that you continue on your current medications as directed. Please refer to the Current Medication list given to you today.    *If you need a refill on your cardiac medications before your next appointment, please call your pharmacy*  Lab Work: No labs ordered today    Testing/Procedures: No test ordered today   Follow-Up: At [facility], you and your health needs are our priority.  As part of our continuing mission to provide you with exceptional heart care, our providers are all part of one team.  This team includes your primary Cardiologist (physician) and Advanced Practice Providers or APPs (Physician Assistants and Nurse Practitioners) who all work together to provide you with the care you need, when you need it.  Your next appointment:   3 month(s)  Provider:   Timothy Gollan, MD

## 2024-08-04 ENCOUNTER — Other Ambulatory Visit: Payer: Self-pay | Admitting: Cardiovascular Disease

## 2024-08-25 ENCOUNTER — Ambulatory Visit: Payer: Medicare HMO

## 2024-08-25 VITALS — Ht 63.0 in | Wt 125.0 lb

## 2024-08-25 DIAGNOSIS — Z0000 Encounter for general adult medical examination without abnormal findings: Secondary | ICD-10-CM | POA: Diagnosis not present

## 2024-08-25 NOTE — Patient Instructions (Signed)
 Tracey [name],  Thank you for taking the time for your Medicare Wellness Visit. I appreciate your continued commitment to your health goals. Please review the care plan we discussed, and feel free to reach out if I can assist you further.  Please note that Annual Wellness Visits do not include a physical exam. Some assessments may be limited, especially if the visit was conducted virtually. If needed, we may recommend an in-person follow-up with your provider.  Ongoing Care Seeing your primary care provider every 3 to 6 months helps us  monitor your health and provide consistent, personalized care.   Referrals If a referral was made during today's visit and you haven't received any updates within two weeks, please contact the referred provider directly to check on the status.  Recommended Screenings:  Health Maintenance  Topic Date Due   Zoster (Shingles) Vaccine (1 of 2) Never done   DTaP/Tdap/Td vaccine (2 - Tdap) 03/18/2023   Breast Cancer Screening  02/15/2024   COVID-19 Vaccine (4 - 2025-26 season) 05/18/2024   Medicare Annual Wellness Visit  08/22/2024   Hemoglobin A1C  12/28/2024   Complete foot exam   03/12/2025   Eye exam for diabetics  05/25/2025   Pneumococcal Vaccine for age over 65  Completed   Flu Shot  Completed   Osteoporosis screening with Bone Density Scan  Completed   Meningitis B Vaccine  Aged Out       08/25/2024    1:44 PM  Advanced Directives  Does Patient Have a Medical Advance Directive? Yes  Type of Estate Agent of [place];Living will  Copy of Healthcare Power of Attorney in Chart? No - copy requested    Vision: Annual vision screenings are recommended for early detection of glaucoma, cataracts, and diabetic retinopathy. These exams can also reveal signs of chronic conditions such as diabetes and high blood pressure.  Dental: Annual dental screenings help detect early signs of oral cancer, gum disease, and other conditions linked  to overall health, including heart disease and diabetes.

## 2024-08-25 NOTE — Progress Notes (Signed)
 Chief Complaint  Patient presents with   Medicare Wellness     Subjective:   Tracey [name] is a 88 y.o. female who presents for a Medicare Annual Wellness Visit.  Visit info / Clinical Intake: Medicare Wellness Visit Type:: Subsequent Annual Wellness Visit Persons participating in visit and providing information:: patient Medicare Wellness Visit Mode:: Telephone If telephone:: video declined Since this visit was completed virtually, some vitals may be partially provided or unavailable. Missing vitals are due to the limitations of the virtual format.: Unable to obtain vitals - no equipment If Telephone or Video please confirm:: I connected with patient using audio/video enable telemedicine. I verified patient identity with two identifiers, discussed telehealth limitations, and patient agreed to proceed. Patient Location:: home Provider Location:: clinic Interpreter Needed?: No Pre-visit prep was completed: yes AWV questionnaire completed by patient prior to visit?: no Living arrangements:: lives with spouse/significant other Patient's Overall Health Status Rating: good Typical amount of pain: some Does pain affect daily life?: (!) yes Are you currently prescribed opioids?: no  Dietary Habits and Nutritional Risks How many meals a day?: 2 Eats fruit and vegetables daily?: yes Most meals are obtained by: preparing own meals In the last 2 weeks, have you had any of the following?: none Diabetic:: (!) yes Any non-healing wounds?: no How often do you check your BS?: 1; as needed Would you like to be referred to a Nutritionist or for Diabetic Management? : no  Functional Status Activities of Daily Living (to include ambulation/medication): Independent Ambulation: Independent Medication Administration: Independent Home Management (perform basic housework or laundry): Independent Manage your own finances?: yes Primary transportation is: driving Concerns about vision?: no  *vision screening is required for WTM* Concerns about hearing?: no  Fall Screening Falls in the past year?: 0 Number of falls in past year: 0 Was there an injury with Fall?: 0 Fall Risk Category Calculator: 0 Patient Fall Risk Level: Low Fall Risk  Fall Risk Patient at Risk for Falls Due to: Orthopedic patient Fall risk Follow up: Education provided; Falls prevention discussed; Falls evaluation completed  Home and Transportation Safety: All rugs have non-skid backing?: yes All stairs or steps have railings?: yes Grab bars in the bathtub or shower?: yes Have non-skid surface in bathtub or shower?: yes Good home lighting?: yes Regular seat belt use?: yes Hospital stays in the last year:: no  Cognitive Assessment Difficulty concentrating, remembering, or making decisions? : yes Will 6CIT or Mini Cog be Completed: yes What year is it?: 0 points What month is it?: 0 points Give patient an address phrase to remember (5 components): [address] [place] About what time is it?: 0 points Count backwards from 20 to 1: 0 points Say the months of the year in reverse: 0 points Repeat the address phrase from earlier: 0 points 6 CIT Score: 0 points  Advance Directives (For Healthcare) Does Patient Have a Medical Advance Directive?: Yes Type of Advance Directive: Healthcare Power of [place]; Living will Copy of Healthcare Power of Attorney in Chart?: No - copy requested Copy of Living Will in Chart?: No - copy requested  Reviewed/Updated  Reviewed/Updated: Reviewed All (Medical, Surgical, Family, Medications, Allergies, Care Teams, Patient Goals)    Allergies (verified) Ferrous sulfate , Codeine, Erythromycin, Tramadol , and Ciprofloxacin   Current Medications (verified) Outpatient Encounter Medications as of 08/25/2024  Medication Sig   aspirin  EC 81 MG tablet Take 81 mg by mouth daily. Swallow whole.   carvedilol  (COREG ) 3.125 MG tablet TAKE ONE  TABLET BY MOUTH ONCE  DAILY   Cholecalciferol (VITAMIN D3) 1.25 MG (50000 UT) CAPS Take 5,000 Units by mouth daily.   clopidogrel  (PLAVIX ) 75 MG tablet TAKE ONE TABLET BY MOUTH ONCE DAILY   CRANBERRY PO Take 1 tablet by mouth daily.   cyanocobalamin  1000 MCG tablet Take 1 tablet (1,000 mcg total) by mouth daily.   ezetimibe  (ZETIA ) 10 MG tablet TAKE ONE TABLET BY MOUTH ONCE DAILY   glipiZIDE  (GLUCOTROL  XL) 5 MG 24 hr tablet Take 1 tablet (5 mg total) by mouth daily with breakfast.   glucose blood (FREESTYLE LITE) test strip check blood sugar TWICE DAILY   hydroxypropyl methylcellulose / hypromellose (ISOPTO TEARS / GONIOVISC) 2.5 % ophthalmic solution Place 1 drop into both eyes daily as needed for dry eyes.   lisinopril  (ZESTRIL ) 10 MG tablet TAKE ONE TABLET BY MOUTH ONCE DAILY   metFORMIN  (GLUCOPHAGE -XR) 500 MG 24 hr tablet TAKE ONE TABLET BY MOUTH ONCE DAILY   Multiple Vitamins-Minerals (MULTIVITAMIN WITH MINERALS) tablet Take 1 tablet by mouth daily.   nitroGLYCERIN  (NITROSTAT ) 0.4 MG SL tablet Place 1 tablet (0.4 mg total) under the tongue every 5 (five) minutes as needed for chest pain.   OneTouch Delica Lancets 33G MISC USE TO TEST TWICE A DAY   pantoprazole  (PROTONIX ) 40 MG tablet TAKE ONE TABLET BY MOUTH EVERY OTHER DAY   polyethylene glycol (MIRALAX / GLYCOLAX) 17 g packet Take 8.5 g by mouth daily as needed for mild constipation or moderate constipation.   rosuvastatin  (CRESTOR ) 40 MG tablet TAKE ONE TABLET BY MOUTH ONCE DAILY   No facility-administered encounter medications on file as of 08/25/2024.    History: Past Medical History:  Diagnosis Date   Anxiety    Arthritis    Atrial fibrillation (HCC)    history   Chronic kidney disease    ckd stage II   Coronary artery disease    Diabetes mellitus    non insulin  dependent   Dysrhythmia    atrial fibrillation, history of   GERD (gastroesophageal reflux disease)    Heart murmur    History of kidney stones    Hyperlipidemia    Hypertension     Peripheral vascular disease    Rectocele    Syncope 09/2019   UTI (urinary tract infection)    Past Surgical History:  Procedure Laterality Date   ABDOMINAL AORTA STENT     APPENDECTOMY     BREAST BIOPSY Left    20 years ago   BREAST BIOPSY Right 2008   BREAST EXCISIONAL BIOPSY     rt 2008 lt 20 years ago   CARDIAC CATHETERIZATION     CATARACT EXTRACTION W/PHACO Left 08/15/2015   Procedure: CATARACT EXTRACTION PHACO AND INTRAOCULAR LENS PLACEMENT (IOC);  Surgeon: Steven Dingeldein, MD;  Location: [facility] ORS;  Service: Ophthalmology;  Laterality: Left;  US  01:28 AP% 26.4 CDE 35.73 fluid pack # [id number]   COLONOSCOPY WITH PROPOFOL  N/A 01/16/2016   Procedure: COLONOSCOPY WITH PROPOFOL ;  Surgeon: Lamar ONEIDA Holmes, MD;  Location: [facility] ENDOSCOPY;  Service: Endoscopy;  Laterality: N/A;   CORONARY ANGIOPLASTY WITH STENT PLACEMENT     CORONARY ARTERY BYPASS GRAFT  1995   with LIMA  to the LAD, SVG to OM1, SVG to PDA   ESOPHAGOGASTRODUODENOSCOPY (EGD) WITH PROPOFOL  N/A 01/16/2016   Procedure: ESOPHAGOGASTRODUODENOSCOPY (EGD) WITH PROPOFOL ;  Surgeon: Lamar ONEIDA Holmes, MD;  Location: [facility] ENDOSCOPY;  Service: Endoscopy;  Laterality: N/A;   EYE SURGERY     HARDWARE REMOVAL Left  04/08/2024   Procedure: REMOVAL, HARDWARE FEMUR;  Surgeon: Kendal Franky SQUIBB, MD;  Location: [facility] OR;  Service: Orthopedics;  Laterality: Left;   JOINT REPLACEMENT Right    KNEE ARTHROPLASTY Left 01/06/2020   Procedure: COMPUTER ASSISTED TOTAL KNEE ARTHROPLASTY;  Surgeon: Mardee Lynwood SQUIBB, MD;  Location: [facility] ORS;  Service: Orthopedics;  Laterality: Left;   LOWER EXTREMITY ANGIOGRAPHY N/A 07/08/2019   Procedure: Abdominal Aortagraphy;  Surgeon: Marea Selinda RAMAN, MD;  Location: [facility] INVASIVE CV LAB;  Service: Cardiovascular;  Laterality: N/A;   ORIF FEMUR FRACTURE Left 07/01/2023   Procedure: OPEN REDUCTION INTERNAL FIXATION (ORIF) DISTAL FEMUR FRACTURE;  Surgeon: Kendal Franky SQUIBB, MD;  Location: [facility] OR;  Service: Orthopedics;   Laterality: Left;   RENAL ANGIOGRAPHY Left 08/26/2019   Procedure: RENAL ANGIOGRAPHY;  Surgeon: Marea Selinda RAMAN, MD;  Location: [facility] CV [facility];  Service: Cardiovascular;  Laterality: Left;   RENAL ARTERY STENT     left, By Dr Primus   REPLACEMENT TOTAL KNEE  03/25/2013   right   TONSILLECTOMY     Family History  Problem Relation Age of Onset   Heart disease Father    Other Brother        open heart surgery   Cancer Brother    Other Brother        open heart surgery   Other Brother        open heart surgery   Cancer Daughter    Hyperlipidemia Daughter    Breast cancer Neg Hx    Kidney cancer Neg Hx    Bladder Cancer Neg Hx    Social History   Occupational History   Occupation: school cafeteria    Comment: retired  Tobacco Use   Smoking status: Never   Smokeless tobacco: Never  Vaping Use   Vaping status: Never Used  Substance and Sexual Activity   Alcohol use: No   Drug use: No   Sexual activity: Never   Tobacco Counseling Counseling given: Not Answered  SDOH Screenings   Food Insecurity: No Food Insecurity (08/25/2024)  Housing: Unknown (08/25/2024)  Transportation Needs: No Transportation Needs (08/25/2024)  Utilities: Not At Risk (08/25/2024)  Alcohol Screen: Low Risk  (08/23/2023)  Depression (PHQ2-9): Low Risk  (08/25/2024)  Financial Resource Strain: Low Risk  (08/23/2023)  Physical Activity: Insufficiently Active (08/25/2024)  Social Connections: Socially Integrated (08/25/2024)  Stress: No Stress Concern Present (08/25/2024)  Tobacco Use: Low Risk  (08/25/2024)  Health Literacy: Adequate Health Literacy (08/25/2024)   See flowsheets for full screening details  Depression Screen PHQ 2 & 9 Depression Scale- Over the past 2 weeks, how often have you been bothered by any of the following problems? Little interest or pleasure in doing things: 0 Feeling down, depressed, or hopeless (PHQ Adolescent also includes...irritable): 0 PHQ-2 Total Score: 0 Trouble falling  or staying asleep, or sleeping too much: 2 Feeling tired or having little energy: 1 Poor appetite or overeating (PHQ Adolescent also includes...weight loss): 2 Feeling bad about yourself - or that you are a failure or have let yourself or your family down: 0 Trouble concentrating on things, such as reading the newspaper or watching television (PHQ Adolescent also includes...like school work): 1 Moving or speaking so slowly that other people could have noticed. Or the opposite - being so fidgety or restless that you have been moving around a lot more than usual: 0 Thoughts that you would be better off dead, or of hurting yourself in some way: 0 PHQ-9 Total Score:  7 If you checked off any problems, how difficult have these problems made it for you to do your work, take care of things at home, or get along with other people?: Somewhat difficult     Goals Addressed             This Visit's Progress    Increase physical activity   Not on track    Starting 10/26/2016, I will continue to walk at least 30 min 3-4 days per week.      Patient Stated   Not on track    Would like to try and walk more.             Objective:    Today's Vitals   08/25/24 1343  Weight: 125 lb (56.7 kg)  Height: 5' 3 (1.6 m)   Body mass index is 22.14 kg/m.  Hearing/Vision screen Vision Screening - Comments:: UTD w/Dr Dingeldein Immunizations and Health Maintenance Health Maintenance  Topic Date Due   Zoster Vaccines- Shingrix (1 of 2) Never done   DTaP/Tdap/Td (2 - Tdap) 03/18/2023   Mammogram  02/15/2024   COVID-19 Vaccine (4 - 2025-26 season) 05/18/2024   Medicare Annual Wellness (AWV)  08/22/2024   HEMOGLOBIN A1C  12/28/2024   FOOT EXAM  03/12/2025   OPHTHALMOLOGY EXAM  05/25/2025   Pneumococcal Vaccine: 50+ Years  Completed   Influenza Vaccine  Completed   Bone Density Scan  Completed   Meningococcal B Vaccine  Aged Out        Assessment/Plan:  This is a routine wellness examination  for Rubyann.  Patient Care Team: Tower, Laine LABOR, MD as PCP - General (Family Medicine) Perla, Evalene PARAS, MD as Consulting Physician (Cardiology) Dingeldein, Elspeth, MD as Consulting Physician (Ophthalmology) Connell Davies, MD (Inactive) as Referring Physician (Obstetrics and Gynecology) Marea Selinda RAMAN, MD as Referring Physician (Vascular Surgery) Lenis Ethelle ORN, MD as Consulting Physician (Endocrinology)  I have personally reviewed and noted the following in the patient's chart:   Medical and social history Use of alcohol, tobacco or illicit drugs  Current medications and supplements including opioid prescriptions. Functional ability and status Nutritional status Physical activity Advanced directives List of other physicians Hospitalizations, surgeries, and ER visits in previous 12 months Vitals Screenings to include cognitive, depression, and falls Referrals and appointments  No orders of the defined types were placed in this encounter.  In addition, I have reviewed and discussed with patient certain preventive protocols, quality metrics, and best practice recommendations. A written personalized care plan for preventive services as well as general preventive health recommendations were provided to patient.   Erminio LITTIE Saris, LPN   [id number]   After Visit Summary: (MyChart) Due to this being a telephonic visit, the after visit summary with patients personalized plan was offered to patient via MyChart   Nurse Notes: Pt has no concerns or questions. AWV/CPE to made simultaneously for 2027

## 2024-09-03 ENCOUNTER — Encounter: Payer: Self-pay | Admitting: Family Medicine

## 2024-09-03 ENCOUNTER — Other Ambulatory Visit: Payer: Self-pay | Admitting: Family Medicine

## 2024-09-03 ENCOUNTER — Inpatient Hospital Stay: Admission: RE | Admit: 2024-09-03 | Discharge: 2024-09-03 | Attending: Family Medicine | Admitting: Family Medicine

## 2024-09-03 DIAGNOSIS — M810 Age-related osteoporosis without current pathological fracture: Secondary | ICD-10-CM | POA: Diagnosis present

## 2024-09-03 DIAGNOSIS — Z1231 Encounter for screening mammogram for malignant neoplasm of breast: Secondary | ICD-10-CM | POA: Diagnosis present

## 2024-09-03 DIAGNOSIS — L988 Other specified disorders of the skin and subcutaneous tissue: Secondary | ICD-10-CM

## 2024-09-06 ENCOUNTER — Ambulatory Visit: Payer: Self-pay | Admitting: Family Medicine

## 2024-09-16 ENCOUNTER — Ambulatory Visit
Admission: RE | Admit: 2024-09-16 | Discharge: 2024-09-16 | Disposition: A | Discharge status: Home / Self Care | Source: Ambulatory Visit | Attending: Family Medicine | Admitting: Family Medicine

## 2024-09-16 DIAGNOSIS — L988 Other specified disorders of the skin and subcutaneous tissue: Secondary | ICD-10-CM | POA: Diagnosis present

## 2024-09-16 DIAGNOSIS — N6342 Unspecified lump in left breast, subareolar: Secondary | ICD-10-CM | POA: Insufficient documentation

## 2024-09-17 ENCOUNTER — Ambulatory Visit: Payer: Self-pay | Admitting: Family Medicine

## 2024-10-06 ENCOUNTER — Encounter: Payer: Self-pay | Admitting: Nurse Practitioner

## 2024-10-06 ENCOUNTER — Ambulatory Visit: Admitting: Nurse Practitioner

## 2024-10-06 VITALS — BP 104/72 | HR 60 | Ht 63.0 in | Wt 124.4 lb

## 2024-10-06 DIAGNOSIS — Z7984 Long term (current) use of oral hypoglycemic drugs: Secondary | ICD-10-CM | POA: Diagnosis not present

## 2024-10-06 DIAGNOSIS — E782 Mixed hyperlipidemia: Secondary | ICD-10-CM

## 2024-10-06 DIAGNOSIS — E559 Vitamin D deficiency, unspecified: Secondary | ICD-10-CM

## 2024-10-06 DIAGNOSIS — N182 Chronic kidney disease, stage 2 (mild): Secondary | ICD-10-CM | POA: Diagnosis not present

## 2024-10-06 DIAGNOSIS — I10 Essential (primary) hypertension: Secondary | ICD-10-CM | POA: Diagnosis not present

## 2024-10-06 DIAGNOSIS — E1122 Type 2 diabetes mellitus with diabetic chronic kidney disease: Secondary | ICD-10-CM

## 2024-10-06 LAB — POCT GLYCOSYLATED HEMOGLOBIN (HGB A1C): Hemoglobin A1C: 9.3 % — AB (ref 4.0–5.6)

## 2024-10-06 MED ORDER — DEXCOM G7 SENSOR MISC: 1.0000 | 3 refills | Status: AC

## 2024-10-06 MED ORDER — DEXCOM G7 RECEIVER DEVI: 1.0000 | Freq: Once | 0 refills | Status: AC

## 2024-10-06 NOTE — Progress Notes (Signed)
 "                                                                                     10/06/2024, 12:50 PM  Endocrinology follow-up note   Subjective:    Patient ID: Tracey [name], female    DOB: 1936/07/09.  Tracey [name] is being seen  in follow-up after she was seen in consultation for management of currently uncontrolled symptomatic diabetes requested by  Randeen Laine LABOR, MD.   Past Medical History:  Diagnosis Date   Anxiety    Arthritis    Atrial fibrillation (HCC)    history   Chronic kidney disease    ckd stage II   Coronary artery disease    Diabetes mellitus    non insulin  dependent   Dysrhythmia    atrial fibrillation, history of   GERD (gastroesophageal reflux disease)    Heart murmur    History of kidney stones    Hyperlipidemia    Hypertension    Peripheral vascular disease    Rectocele    Syncope 09/2019   UTI (urinary tract infection)     Past Surgical History:  Procedure Laterality Date   ABDOMINAL AORTA STENT     APPENDECTOMY     BREAST BIOPSY Left    20 years ago   BREAST BIOPSY Right 2008   BREAST EXCISIONAL BIOPSY     rt 2008 lt 20 years ago   CARDIAC CATHETERIZATION     CATARACT EXTRACTION W/PHACO Left 08/15/2015   Procedure: CATARACT EXTRACTION PHACO AND INTRAOCULAR LENS PLACEMENT (IOC);  Surgeon: Steven Dingeldein, MD;  Location: [facility] ORS;  Service: Ophthalmology;  Laterality: Left;  US  01:28 AP% 26.4 CDE 35.73 fluid pack # [id number]   COLONOSCOPY WITH PROPOFOL  N/A 01/16/2016   Procedure: COLONOSCOPY WITH PROPOFOL ;  Surgeon: Lamar ONEIDA Holmes, MD;  Location: [facility] ENDOSCOPY;  Service: Endoscopy;  Laterality: N/A;   CORONARY ANGIOPLASTY WITH STENT PLACEMENT     CORONARY ARTERY BYPASS GRAFT  1995   with LIMA  to the LAD, SVG to OM1, SVG to PDA   ESOPHAGOGASTRODUODENOSCOPY (EGD) WITH PROPOFOL  N/A 01/16/2016   Procedure: ESOPHAGOGASTRODUODENOSCOPY (EGD) WITH PROPOFOL ;  Surgeon: Lamar ONEIDA Holmes, MD;  Location: [facility] ENDOSCOPY;  Service:  Endoscopy;  Laterality: N/A;   EYE SURGERY     HARDWARE REMOVAL Left 04/08/2024   Procedure: REMOVAL, HARDWARE FEMUR;  Surgeon: Kendal Franky SQUIBB, MD;  Location: [facility] OR;  Service: Orthopedics;  Laterality: Left;   JOINT REPLACEMENT Right    KNEE ARTHROPLASTY Left 01/06/2020   Procedure: COMPUTER ASSISTED TOTAL KNEE ARTHROPLASTY;  Surgeon: Mardee Lynwood SQUIBB, MD;  Location: [facility] ORS;  Service: Orthopedics;  Laterality: Left;   LOWER EXTREMITY ANGIOGRAPHY N/A 07/08/2019   Procedure: Abdominal Aortagraphy;  Surgeon: Marea Selinda RAMAN, MD;  Location: [facility] INVASIVE CV LAB;  Service: Cardiovascular;  Laterality: N/A;   ORIF FEMUR FRACTURE Left 07/01/2023   Procedure: OPEN REDUCTION INTERNAL FIXATION (ORIF) DISTAL FEMUR FRACTURE;  Surgeon: Kendal Franky SQUIBB, MD;  Location: [facility] OR;  Service: Orthopedics;  Laterality: Left;   RENAL ANGIOGRAPHY Left 08/26/2019   Procedure: RENAL ANGIOGRAPHY;  Surgeon: Marea Selinda RAMAN, MD;  Location: [facility] INVASIVE  CV LAB;  Service: Cardiovascular;  Laterality: Left;   RENAL ARTERY STENT     left, By Dr Primus   REPLACEMENT TOTAL KNEE  03/25/2013   right   TONSILLECTOMY      Social History   Socioeconomic History   Marital status: Married    Spouse name: Not on file   Number of children: Not on file   Years of education: Not on file   Highest education level: Not on file  Occupational History   Occupation: school cafeteria    Comment: retired  Tobacco Use   Smoking status: Never   Smokeless tobacco: Never  Vaping Use   Vaping status: Never Used  Substance and Sexual Activity   Alcohol use: No   Drug use: No   Sexual activity: Never  Other Topics Concern   Not on file  Social History Narrative   Patient has 3 daughters. Remarried June 2021.   Social Drivers of Health   Tobacco Use: Low Risk (10/06/2024)   Patient History    Smoking Tobacco Use: Never    Smokeless Tobacco Use: Never    Passive Exposure: Not on file  Financial Resource Strain: Low Risk (08/23/2023)    Overall Financial Resource Strain (CARDIA)    Difficulty of Paying Living Expenses: Not hard at all  Food Insecurity: No Food Insecurity (08/25/2024)   Epic    Worried About Programme Researcher, Broadcasting/film/video in the Last Year: Never true    Ran Out of Food in the Last Year: Never true  Transportation Needs: No Transportation Needs (08/25/2024)   Epic    Lack of Transportation (Medical): No    Lack of Transportation (Non-Medical): No  Physical Activity: Insufficiently Active (08/25/2024)   Exercise Vital Sign    Days of Exercise per Week: 4 days    Minutes of Exercise per Session: 30 min  Stress: No Stress Concern Present (08/25/2024)   [employer] of Occupational Health - Occupational Stress Questionnaire    Feeling of Stress: Only a little  Social Connections: Socially Integrated (08/25/2024)   Social Connection and Isolation Panel    Frequency of Communication with Friends and Family: More than three times a week    Frequency of Social Gatherings with Friends and Family: More than three times a week    Attends Religious Services: More than 4 times per year    Active Member of Clubs or Organizations: Yes    Attends Banker Meetings: More than 4 times per year    Marital Status: Married  Depression (PHQ2-9): Low Risk (08/25/2024)   Depression (PHQ2-9)    PHQ-2 Score: 0  Alcohol Screen: Low Risk (08/23/2023)   Alcohol Screen    Last Alcohol Screening Score (AUDIT): 0  Housing: Unknown (08/25/2024)   Epic    Unable to Pay for Housing in the Last Year: No    Number of Times Moved in the Last Year: Not on file    Homeless in the Last Year: No  Utilities: Not At Risk (08/25/2024)   Epic    Threatened with loss of utilities: No  Health Literacy: Adequate Health Literacy (08/25/2024)   B1300 Health Literacy    Frequency of need for help with medical instructions: Never    Family History  Problem Relation Age of Onset   Heart disease Father    Other Brother        open heart surgery    Cancer Brother    Other Brother  open heart surgery   Other Brother        open heart surgery   Cancer Daughter    Hyperlipidemia Daughter    Breast cancer Neg Hx    Kidney cancer Neg Hx    Bladder Cancer Neg Hx     Outpatient Encounter Medications as of 10/06/2024  Medication Sig   aspirin  EC 81 MG tablet Take 81 mg by mouth daily. Swallow whole.   carvedilol  (COREG ) 3.125 MG tablet TAKE ONE TABLET BY MOUTH ONCE DAILY   Cholecalciferol (VITAMIN D3) 1.25 MG (50000 UT) CAPS Take 5,000 Units by mouth daily.   clopidogrel  (PLAVIX ) 75 MG tablet TAKE ONE TABLET BY MOUTH ONCE DAILY   Continuous Glucose Receiver (DEXCOM G7 RECEIVER) DEVI 1 Device by Does not apply route once for 1 dose.   Continuous Glucose Sensor (DEXCOM G7 SENSOR) MISC Inject 1 Application into the skin as directed. Change sensor every 10 days as directed.   CRANBERRY PO Take 1 tablet by mouth daily.   cyanocobalamin  1000 MCG tablet Take 1 tablet (1,000 mcg total) by mouth daily.   ezetimibe  (ZETIA ) 10 MG tablet TAKE ONE TABLET BY MOUTH ONCE DAILY   glipiZIDE  (GLUCOTROL  XL) 5 MG 24 hr tablet Take 1 tablet (5 mg total) by mouth daily with breakfast.   glucose blood (FREESTYLE LITE) test strip check blood sugar TWICE DAILY   hydroxypropyl methylcellulose / hypromellose (ISOPTO TEARS / GONIOVISC) 2.5 % ophthalmic solution Place 1 drop into both eyes daily as needed for dry eyes.   lisinopril  (ZESTRIL ) 10 MG tablet TAKE ONE TABLET BY MOUTH ONCE DAILY   metFORMIN  (GLUCOPHAGE -XR) 500 MG 24 hr tablet TAKE ONE TABLET BY MOUTH ONCE DAILY   Multiple Vitamins-Minerals (MULTIVITAMIN WITH MINERALS) tablet Take 1 tablet by mouth daily.   nitroGLYCERIN  (NITROSTAT ) 0.4 MG SL tablet Place 1 tablet (0.4 mg total) under the tongue every 5 (five) minutes as needed for chest pain.   OneTouch Delica Lancets 33G MISC USE TO TEST TWICE A DAY   pantoprazole  (PROTONIX ) 40 MG tablet TAKE ONE TABLET BY MOUTH EVERY OTHER DAY   polyethylene  glycol (MIRALAX / GLYCOLAX) 17 g packet Take 8.5 g by mouth daily as needed for mild constipation or moderate constipation.   rosuvastatin  (CRESTOR ) 40 MG tablet TAKE ONE TABLET BY MOUTH ONCE DAILY   No facility-administered encounter medications on file as of 10/06/2024.    ALLERGIES: Allergies  Allergen Reactions   Ferrous Sulfate  Anaphylaxis and Rash   Codeine Nausea Only    Sick to her stomach   Erythromycin Nausea And Vomiting and Other (See Comments)   Tramadol  Nausea Only and Other (See Comments)    Loss of appetite and did not eat   Ciprofloxacin Diarrhea    Upsets stomach    VACCINATION STATUS: Immunization History  Administered Date(s) Administered   Fluad Quad(high Dose 65+) 06/15/2019, 09/26/2020   Fluad Trivalent(High Dose 65+) 10/31/2023, 07/28/2024   INFLUENZA, HIGH DOSE SEASONAL PF 11/05/2022   Influenza Split 08/10/2015   Influenza,inj,Quad PF,6+ Mos 05/24/2016, 07/26/2017, 06/03/2018, 07/11/2021   Influenza-Unspecified 08/10/2015, 05/24/2016   Moderna Sars-Covid-2 Vaccination 10/02/2019, 10/30/2019, 08/01/2020   Pneumococcal Conjugate-13 11/05/2016   Pneumococcal Polysaccharide-23 12/11/2017   Td 03/17/2013    Diabetes She presents for her follow-up diabetic visit. She has type 2 diabetes mellitus. Onset time: She was diagnosed at approximate age of 89 years. Her disease course has been stable. There are no hypoglycemic associated symptoms. Pertinent negatives for hypoglycemia include no confusion, pallor or  seizures. Associated symptoms include fatigue. Pertinent negatives for diabetes include no polydipsia, no polyphagia, no polyuria and no weight loss. There are no hypoglycemic complications. Symptoms are stable. Diabetic complications include heart disease and nephropathy. Risk factors for coronary artery disease include dyslipidemia, diabetes mellitus, hypertension, sedentary lifestyle and post-menopausal. Current diabetic treatment includes oral agent (dual  therapy). She is compliant with treatment all of the time. Her weight is fluctuating minimally. She is following a generally healthy diet. Meal planning includes avoidance of concentrated sweets and ADA exchanges. She participates in exercise daily. Her home blood glucose trend is increasing steadily. Her breakfast blood glucose range is generally 140-180 mg/dl. (She presents today, accompanied by her granddaughter, with her meter and logs showing above target fasting glycemic profile.  Her POCT A1c today was 9.3%, increasing from last visit of 8.7%. Analysis of her meter shows 7-day average of 144, 14-day average of 147, 30-day average of 151.  She denies any symptoms of hypoglycemia, however granddaughter notes at times she will have some symptoms such as loss of balance.  She continues to eat relatively healthy, however notes she did over-indulge during the holiday season.) An ACE inhibitor/angiotensin II receptor blocker is being taken. She does not see a podiatrist.Eye exam is current.    Review of systems  Constitutional: + Minimally fluctuating body weight,  current Body mass index is 22.04 kg/m. , no fatigue, no subjective hyperthermia, no subjective hypothermia Eyes: no blurry vision, no xerophthalmia ENT: no sore throat, no nodules palpated in throat, no dysphagia/odynophagia, no hoarseness Cardiovascular: no chest pain, no shortness of breath, no palpitations, no leg swelling Respiratory: no cough, no shortness of breath Gastrointestinal: no nausea/vomiting/diarrhea Musculoskeletal: no muscle/joint aches Skin: no rashes, no hyperemia Neurological: no tremors, no numbness, no tingling, no dizziness Psychiatric: no depression, no anxiety   Objective:    BP 104/72 (BP Location: Left Arm, Patient Position: Sitting, Cuff Size: Large)   Pulse 60   Ht 5' 3 (1.6 m)   Wt 124 lb 6.4 oz (56.4 kg)   BMI 22.04 kg/m   Wt Readings from Last 3 Encounters:  10/06/24 124 lb 6.4 oz (56.4 kg)   08/25/24 125 lb (56.7 kg)  07/07/24 125 lb 6.4 oz (56.9 kg)    BP Readings from Last 3 Encounters:  10/06/24 104/72  07/07/24 (!) 140/50  06/29/24 118/74     Physical Exam- Limited  Constitutional:  Body mass index is 22.04 kg/m. , not in acute distress, normal state of mind Eyes:  EOMI, no exophthalmos Musculoskeletal: no gross deformities, strength intact in all four extremities, no gross restriction of joint movements Skin:  no rashes, no hyperemia Neurological: no tremor with outstretched hands   Diabetic Foot Exam - Simple   No data filed      CMP ( most recent) CMP     Component Value Date/Time   NA 140 05/26/2024 1601   NA 137 03/27/2013 0545   K 4.8 05/26/2024 1601   K 3.9 03/27/2013 0545   CL 104 05/26/2024 1601   CL 103 03/27/2013 0545   CO2 21 05/26/2024 1601   CO2 27 03/27/2013 0545   GLUCOSE 132 (H) 05/26/2024 1601   GLUCOSE 133 (H) 04/08/2024 0552   GLUCOSE 122 (H) 03/27/2013 0545   BUN 25 05/26/2024 1601   BUN 10 03/27/2013 0545   CREATININE 1.14 (H) 05/26/2024 1601   CREATININE 1.08 (H) 06/19/2023 0922   CALCIUM  11.0 (H) 05/26/2024 1601   CALCIUM  8.9 03/27/2013 0545  PROT 6.7 11/12/2023 0925   PROT 7.6 10/07/2011 1316   ALBUMIN 4.4 11/12/2023 0925   ALBUMIN 4.5 10/07/2011 1316   AST 28 11/12/2023 0925   AST 26 10/07/2011 1316   ALT 22 11/12/2023 0925   ALT 26 10/07/2011 1316   ALKPHOS 80 11/12/2023 0925   ALKPHOS 46 (L) 10/07/2011 1316   BILITOT 0.6 11/12/2023 0925   BILITOT 0.5 10/07/2011 1316   GFRNONAA 53 (L) 04/08/2024 0552   GFRNONAA 54 (L) 03/03/2020 0000   GFRAA 63 03/03/2020 0000     Diabetic Labs (most recent): Lab Results  Component Value Date   HGBA1C 9.3 (A) 10/06/2024   HGBA1C 8.7 (A) 06/29/2024   HGBA1C 8.7 (A) 03/12/2024   MICROALBUR 80 mg/L 03/12/2024   MICROALBUR 30mg /L 04/01/2023   MICROALBUR 30 04/26/2021     Lipid Panel ( most recent) Lipid Panel     Component Value Date/Time   CHOL 128 11/12/2023  0925   CHOL 158 12/12/2020 1115   CHOL 141 10/08/2011 0457   TRIG 141.0 11/12/2023 0925   TRIG 179 10/08/2011 0457   HDL 48.50 11/12/2023 0925   HDL 52 12/12/2020 1115   HDL 37 (L) 10/08/2011 0457   CHOLHDL 3 11/12/2023 0925   VLDL 28.2 11/12/2023 0925   VLDL 36 10/08/2011 0457   LDLCALC 51 11/12/2023 0925   LDLCALC 70 04/24/2021 1043   LDLCALC 68 10/08/2011 0457      Lab Results  Component Value Date   TSH 1.930 05/26/2024   TSH 2.90 11/12/2023   TSH 1.78 11/13/2022   TSH 2.44 11/01/2021   TSH 1.94 06/07/2021   TSH 1.99 04/24/2021   TSH 1.66 03/03/2020   TSH 2.68 03/05/2019   TSH 2.96 11/06/2017   TSH 1.24 02/01/2017   FREET4 1.4 04/24/2021   FREET4 1.4 03/03/2020      Assessment & Plan:   1) Type 2 diabetes complicated by stage 2 CKD  - Carollyn M Weng has currently controlled asymptomatic type 2 DM since  89 years of age.  She presents today, accompanied by her granddaughter, with her meter and logs showing above target fasting glycemic profile.  Her POCT A1c today was 9.3%, increasing from last visit of 8.7%. Analysis of her meter shows 7-day average of 144, 14-day average of 147, 30-day average of 151.  She denies any symptoms of hypoglycemia, however granddaughter notes at times she will have some symptoms such as loss of balance.  She continues to eat relatively healthy, however notes she did over-indulge during the holiday season.  -Recent labs reviewed.  - I had a long discussion with her about the progressive nature of diabetes and the pathology behind its complications. -her diabetes is complicated by stage 2 CKD and she remains at a high risk for more acute and chronic complications which include CAD, CVA, CKD, retinopathy, and neuropathy. These are all discussed in detail with her.  The following Lifestyle Medicine recommendations according to American College of Lifestyle Medicine [facility]) were discussed and offered to patient and she agrees to start the  journey:  A. Whole Foods, Plant-based plate comprising of fruits and vegetables, plant-based proteins, whole-grain carbohydrates was discussed in detail with the patient.   A list for source of those nutrients were also provided to the patient.  Patient will use only water or unsweetened tea for hydration. B.  The need to stay away from risky substances including alcohol, smoking; obtaining 7 to 9 hours of restorative sleep, at least 150  minutes of moderate intensity exercise weekly, the importance of healthy social connections,  and stress reduction techniques were discussed. C.  A full color page of  Calorie density of various food groups per pound showing examples of each food groups was provided to the patient.  - Nutritional counseling repeated/built upon at each appointment.  - The patient admits there is a room for improvement in their diet and drink choices. -  Suggestion is made for the patient to avoid simple carbohydrates from their diet including Cakes, Sweet Desserts / Pastries, Ice Cream, Soda (diet and regular), Sweet Tea, Candies, Chips, Cookies, Sweet Pastries, Store Bought Juices, Alcohol in Excess of 1-2 drinks a day, Artificial Sweeteners, Coffee Creamer, and Sugar-free Products. This will help patient to have stable blood glucose profile and potentially avoid unintended weight gain.   - I encouraged the patient to switch to unprocessed or minimally processed complex starch and increased protein intake (animal or plant source), fruits, and vegetables.   - Patient is advised to stick to a routine mealtimes to eat 3 meals a day and avoid unnecessary snacks (to snack only to correct hypoglycemia).  - I have approached her with the following individualized plan to manage  her diabetes and patient agrees:   -She is advised to continue Metformin  500 mg ER daily with breakfast and Glipizide  5 mg XL daily at breakfast.  We did talk about potentially adding basal insulin  if A1c does not  continue to improve on subsequent visits.  Will check insulin  and c-peptide to evaluate pancreatic function.    -She is encouraged to continue monitoring blood glucose at least once daily, before breakfast and call the clinic if she gets readings less than 70 or greater than 200 for 3 tests in a row.  Will order CGM to help identify problematic patterns in her glucose.  I sent to local pharmacy but if not covered there, will try Aeroflow.  - she is not a candidate for full dose Metformin  due to concurrent renal insufficiency.  - she is not a candidate for incretin therapy due to her body habitus.    She could not afford copay for Januvia , or Jardiance.  - Patient specific target  A1c;  LDL, HDL, Triglycerides, and  Waist Circumference were discussed in detail.  2) Blood Pressure /Hypertension:  Her blood pressure is controlled to target.  She is advised to continue current medications as prescribed by her PCP.  3) Lipids/Hyperlipidemia: Her most recent lipid panel from 11/12/23 shows controlled LDL at 51.  She is advised to continue Crestor  40 mg po daily at bedtime.  Side effects and precautions discussed with her.    4)  Weight/Diet: Her Body mass index is 22.04 kg/m.  -She is not a candidate for weight loss.  Exercise, and detailed carbohydrates information provided  -  detailed on discharge instructions.  5) Chronic Care/Health Maintenance: -she is on ACEI/ARB and Statin medications and  is encouraged to initiate and continue to follow up with Ophthalmology, Dentist,  Podiatrist at least yearly or according to recommendations, and advised to   stay away from smoking. I have recommended yearly flu vaccine and pneumonia vaccine at least every 5 years; moderate intensity exercise for up to 150 minutes weekly; and  sleep for at least 7 hours a day.  - she is advised to maintain close follow up with Tower, Laine LABOR, MD for primary care needs, as well as her other providers for optimal and  coordinated care.  I spent  51  minutes in the care of the patient today including review of labs from CMP, Lipids, Thyroid  Function, Hematology (current and previous including abstractions from other facilities); face-to-face time discussing  her blood glucose readings/logs, discussing hypoglycemia and hyperglycemia episodes and symptoms, medications doses, her options of short and long term treatment based on the latest standards of care / guidelines;  discussion about incorporating lifestyle medicine;  and documenting the encounter. Risk reduction counseling performed per USPSTF guidelines to reduce obesity and cardiovascular risk factors.     Please refer to Patient Instructions for Blood Glucose Monitoring and Insulin /Medications Dosing Guide  in media tab for additional information. Please  also refer to  Patient Self Inventory in the Media  tab for reviewed elements of pertinent patient history.  Donia HERO Celaya participated in the discussions, expressed understanding, and voiced agreement with the above plans.  All questions were answered to her satisfaction. she is encouraged to contact clinic should she have any questions or concerns prior to her return visit.    Follow up plan: - Return in about 3 months (around 01/04/2025) for Diabetes F/U with A1c in office, Previsit labs, Bring meter and logs.  Benton Rio, [facility] [address] [place], [place] [address] Phone: [phone] Fax: [phone]  10/06/2024, 12:50 PM    "

## 2024-10-09 ENCOUNTER — Encounter: Payer: Self-pay | Admitting: Cardiovascular Disease

## 2024-10-09 ENCOUNTER — Ambulatory Visit: Attending: Cardiovascular Disease | Admitting: Cardiovascular Disease

## 2024-10-09 VITALS — BP 132/62 | HR 65 | Ht 62.0 in | Wt 124.1 lb

## 2024-10-09 DIAGNOSIS — I25118 Atherosclerotic heart disease of native coronary artery with other forms of angina pectoris: Secondary | ICD-10-CM | POA: Diagnosis not present

## 2024-10-09 DIAGNOSIS — I5032 Chronic diastolic (congestive) heart failure: Secondary | ICD-10-CM

## 2024-10-09 DIAGNOSIS — R0602 Shortness of breath: Secondary | ICD-10-CM

## 2024-10-09 DIAGNOSIS — I10 Essential (primary) hypertension: Secondary | ICD-10-CM | POA: Diagnosis not present

## 2024-10-09 NOTE — Progress Notes (Signed)
 "     Date:  10/09/2024   ID:  Tracey [name], DOB 01/01/1936, MRN [id number]  Patient Location:  [address] [facility] [place] [address]   Provider location:   [facility], [place] office  PCP:  Randeen Laine LABOR, MD  Cardiologist:  Perla MOCCASIN [facility]  Chief Complaint  Patient presents with   3 month follow up     Patient c/o more frequent spells of palpitations & shortness of breath with over exertion.     History of Present Illness:    Tracey [name] is a 89 y.o. female  past medical history of coronary artery disease, bypass surgery in 1995,  diabetes  stent to her graft to the OM several years ago with chest pain January 2013  repeat stenting to the graft to the OM with a Xience 3.5 x 18 mm stent  delivery of a shock for  resuscitation during the procedure),  left renal PCI in 1998, redo PCI March 2010 followed by Dr. Marea,  History of near syncope spells October 2024, Femur fx, surgery, Hardware removal leg July 2025, who presents for routine followup of her coronary artery disease   She  lost her husband in 2016  Last seen by myself in clinic March 2024 In follow-up today, discussed recent events  Prior ORIF of left periprosthetic distal femur fracture Secondary to persistent pain from hardware, She presented for hardware removal July 2025 Last activity secondary to recovering from surgery,  Has appreciated periodic palpitations Shortness of breath on hills such as walking into the office today  Lab work reviewed A1C 9.3 up from 8 CR 1.14  Echo 9/25 normal ejection fraction  Myoview  April 2024 with no ischemia normal ejection fraction   October 2024, Femur fx, surgery, anemia, hypotension Transfusion  EKG personally reviewed by myself on todays visit EKG Interpretation Date/Time:  Friday October 09 2024 11:26:29 EST Ventricular Rate:  65 PR Interval:  214 QRS Duration:  96 QT Interval:  404 QTC Calculation: 420 R Axis:   36  Text  Interpretation: Sinus rhythm with 1st degree A-V block with Premature atrial complexes Low voltage QRS When compared with ECG of 23-Jul-2023 08:43, Premature atrial complexes are now Present Confirmed by Perla Lye [phone]) on 10/09/2024 12:08:44 PM   Other past medical history reviewed Prior history of near syncope/syncope episodes Several falls 1 episode reports she was in her Backyard, had a quick dizzy spell, fell face down  Another episode, 6 Am, went to the bathroom, was walking back to bed, had a flash spell, fell, broke hand  Monday, flash spell, felt very dizzy Denies vertigo  Followed by Dr. Marea 08/25/2020, vascular procedure reviewed and discussed with her in detail Atherosclerotic occlusive disease bilateral lower extremities with rest pain and lifestyle limiting claudication; recurrent left renal artery stenosis; renovascular hypertension  Percutaneous transluminal angioplasty and stent placement pararenal aorta   Percutaneous transluminal angioplasty and stent placement left renal artery  Carotid Carotid artery ultrasound showed 1-39% RICA stenosis and 40-59% LICA stenosis.   Echo: Normal EF 65% RVSP 32 Mild to moderate MR   Past Medical History:  Diagnosis Date   Anxiety    Arthritis    Atrial fibrillation (HCC)    history   Chronic kidney disease    ckd stage II   Coronary artery disease    Diabetes mellitus    non insulin  dependent   Dysrhythmia    atrial fibrillation, history of   GERD (gastroesophageal reflux disease)  Heart murmur    History of kidney stones    Hyperlipidemia    Hypertension    Peripheral vascular disease    Rectocele    Syncope 09/2019   UTI (urinary tract infection)    Past Surgical History:  Procedure Laterality Date   ABDOMINAL AORTA STENT     APPENDECTOMY     BREAST BIOPSY Left    20 years ago   BREAST BIOPSY Right 2008   BREAST EXCISIONAL BIOPSY     rt 2008 lt 20 years ago   CARDIAC CATHETERIZATION      CATARACT EXTRACTION W/PHACO Left 08/15/2015   Procedure: CATARACT EXTRACTION PHACO AND INTRAOCULAR LENS PLACEMENT (IOC);  Surgeon: Steven Dingeldein, MD;  Location: [facility] ORS;  Service: Ophthalmology;  Laterality: Left;  US  01:28 AP% 26.4 CDE 35.73 fluid pack # [id number]   COLONOSCOPY WITH PROPOFOL  N/A 01/16/2016   Procedure: COLONOSCOPY WITH PROPOFOL ;  Surgeon: Lamar ONEIDA Holmes, MD;  Location: [facility] ENDOSCOPY;  Service: Endoscopy;  Laterality: N/A;   CORONARY ANGIOPLASTY WITH STENT PLACEMENT     CORONARY ARTERY BYPASS GRAFT  1995   with LIMA  to the LAD, SVG to OM1, SVG to PDA   ESOPHAGOGASTRODUODENOSCOPY (EGD) WITH PROPOFOL  N/A 01/16/2016   Procedure: ESOPHAGOGASTRODUODENOSCOPY (EGD) WITH PROPOFOL ;  Surgeon: Lamar ONEIDA Holmes, MD;  Location: [facility] ENDOSCOPY;  Service: Endoscopy;  Laterality: N/A;   EYE SURGERY     HARDWARE REMOVAL Left 04/08/2024   Procedure: REMOVAL, HARDWARE FEMUR;  Surgeon: Kendal Franky SQUIBB, MD;  Location: [facility] OR;  Service: Orthopedics;  Laterality: Left;   JOINT REPLACEMENT Right    KNEE ARTHROPLASTY Left 01/06/2020   Procedure: COMPUTER ASSISTED TOTAL KNEE ARTHROPLASTY;  Surgeon: Mardee Lynwood SQUIBB, MD;  Location: [facility] ORS;  Service: Orthopedics;  Laterality: Left;   LOWER EXTREMITY ANGIOGRAPHY N/A 07/08/2019   Procedure: Abdominal Aortagraphy;  Surgeon: Marea Selinda RAMAN, MD;  Location: [facility] INVASIVE CV LAB;  Service: Cardiovascular;  Laterality: N/A;   ORIF FEMUR FRACTURE Left 07/01/2023   Procedure: OPEN REDUCTION INTERNAL FIXATION (ORIF) DISTAL FEMUR FRACTURE;  Surgeon: Kendal Franky SQUIBB, MD;  Location: [facility] OR;  Service: Orthopedics;  Laterality: Left;   RENAL ANGIOGRAPHY Left 08/26/2019   Procedure: RENAL ANGIOGRAPHY;  Surgeon: Marea Selinda RAMAN, MD;  Location: [facility] INVASIVE CV LAB;  Service: Cardiovascular;  Laterality: Left;   RENAL ARTERY STENT     left, By Dr Primus   REPLACEMENT TOTAL KNEE  03/25/2013   right   TONSILLECTOMY       Current Meds  Medication Sig   aspirin  EC 81 MG  tablet Take 81 mg by mouth daily. Swallow whole.   carvedilol  (COREG ) 3.125 MG tablet TAKE ONE TABLET BY MOUTH ONCE DAILY   Cholecalciferol (VITAMIN D3) 1.25 MG (50000 UT) CAPS Take 5,000 Units by mouth daily.   clopidogrel  (PLAVIX ) 75 MG tablet TAKE ONE TABLET BY MOUTH ONCE DAILY   CRANBERRY PO Take 1 tablet by mouth daily.   cyanocobalamin  1000 MCG tablet Take 1 tablet (1,000 mcg total) by mouth daily.   ezetimibe  (ZETIA ) 10 MG tablet TAKE ONE TABLET BY MOUTH ONCE DAILY   glipiZIDE  (GLUCOTROL  XL) 5 MG 24 hr tablet Take 1 tablet (5 mg total) by mouth daily with breakfast.   hydroxypropyl methylcellulose / hypromellose (ISOPTO TEARS / GONIOVISC) 2.5 % ophthalmic solution Place 1 drop into both eyes daily as needed for dry eyes.   lisinopril  (ZESTRIL ) 10 MG tablet TAKE ONE TABLET BY MOUTH ONCE DAILY   metFORMIN  (GLUCOPHAGE -XR) 500 MG  24 hr tablet TAKE ONE TABLET BY MOUTH ONCE DAILY   Multiple Vitamins-Minerals (MULTIVITAMIN WITH MINERALS) tablet Take 1 tablet by mouth daily.   nitroGLYCERIN  (NITROSTAT ) 0.4 MG SL tablet Place 1 tablet (0.4 mg total) under the tongue every 5 (five) minutes as needed for chest pain.   OneTouch Delica Lancets 33G MISC USE TO TEST TWICE A DAY   pantoprazole  (PROTONIX ) 40 MG tablet TAKE ONE TABLET BY MOUTH EVERY OTHER DAY   polyethylene glycol (MIRALAX / GLYCOLAX) 17 g packet Take 8.5 g by mouth daily as needed for mild constipation or moderate constipation.   rosuvastatin  (CRESTOR ) 40 MG tablet TAKE ONE TABLET BY MOUTH ONCE DAILY     Allergies:   Ferrous sulfate , Codeine, Erythromycin, Tramadol , and Ciprofloxacin   Family Hx: The patient's family history includes Cancer in her brother and daughter; Heart disease in her father; Hyperlipidemia in her daughter; Other in her brother, brother, and brother. There is no history of Breast cancer, Kidney cancer, or Bladder Cancer.  ROS:   Please see the history of present illness.    Review of Systems  Constitutional:   Positive for malaise/fatigue.  HENT: Negative.    Respiratory:  Positive for shortness of breath.   Cardiovascular: Negative.   Gastrointestinal: Negative.   Musculoskeletal: Negative.   Neurological: Negative.   Psychiatric/Behavioral: Negative.    All other systems reviewed and are negative.    Labs/Other Tests and Data Reviewed:    Recent Labs: 11/12/2023: ALT 22 05/26/2024: BUN 25; Creatinine, Ser 1.14; Hemoglobin 11.8; Platelets 160; Potassium 4.8; Sodium 140; TSH 1.930   Recent Lipid Panel Lab Results  Component Value Date/Time   CHOL 128 11/12/2023 09:25 AM   CHOL 158 12/12/2020 11:15 AM   CHOL 141 10/08/2011 04:57 AM   TRIG 141.0 11/12/2023 09:25 AM   TRIG 179 10/08/2011 04:57 AM   HDL 48.50 11/12/2023 09:25 AM   HDL 52 12/12/2020 11:15 AM   HDL 37 (L) 10/08/2011 04:57 AM   CHOLHDL 3 11/12/2023 09:25 AM   LDLCALC 51 11/12/2023 09:25 AM   LDLCALC 70 04/24/2021 10:43 AM   LDLCALC 68 10/08/2011 04:57 AM    Wt Readings from Last 3 Encounters:  10/09/24 124 lb 2 oz (56.3 kg)  10/06/24 124 lb 6.4 oz (56.4 kg)  08/25/24 125 lb (56.7 kg)     Exam:    BP 132/62 (BP Location: Left Arm, Patient Position: Sitting, Cuff Size: Normal)   Pulse 65   Ht 5' 2 (1.575 m)   Wt 124 lb 2 oz (56.3 kg)   SpO2 99%   BMI 22.70 kg/m  Constitutional:  oriented to person, place, and time. No distress.  HENT:  Head: Grossly normal Eyes:  no discharge. No scleral icterus.  Neck: No JVD, no carotid bruits  Cardiovascular: Regular rate and rhythm, no murmurs appreciated Pulmonary/Chest: Clear to auscultation bilaterally, no wheezes or rails Abdominal: Soft.  no distension.  no tenderness.  Musculoskeletal: Normal range of motion Neurological:  normal muscle tone. Coordination normal. No atrophy Skin: Skin warm and dry Psychiatric: normal affect, pleasant  ASSESSMENT & PLAN:    Coronary artery disease with stable angina, history of CABG Denies chest pain concerning for angina -  Reports having shortness of breath walking up hills - Reports she is active, though suspect more deconditioned after femur fracture end of 2024 with hardware removal July 2025 - Suggested if shortness of breath gets worse she call our office, unable to exclude ischemia, stress testing could  be performed  Supraventricular tachycardia Rare short episodes tachycardia, palpitations Continue low-dose carvedilol  - Zio monitor could be performed for worsening symptoms Prior history of near syncope,  ZIO monitor completed Triggered events not associated with significant arrhythmia  PAD (peripheral artery disease) (HCC) Significant aortic disease, renal artery disease Prior intervention by Dr. Marea,  Continue aspirin  Plavix  (per vascular team), statin, cholesterol at goal  Hyperlipidemia Continue Crestor  Zetia , cholesterol at goal  Diabetes type 2 with complications Working with primary care and endocrine, A1c greater than 9    Signed, Evalene Lunger, MD  10/09/2024 12:10 PM    [facility] [address], [place], [place] [address]  "

## 2024-10-09 NOTE — Patient Instructions (Signed)

## 2024-10-12 ENCOUNTER — Ambulatory Visit: Admitting: Cardiovascular Disease

## 2024-10-22 ENCOUNTER — Telehealth: Payer: Self-pay | Admitting: *Deleted

## 2024-10-22 DIAGNOSIS — I10 Essential (primary) hypertension: Secondary | ICD-10-CM

## 2024-10-22 DIAGNOSIS — M810 Age-related osteoporosis without current pathological fracture: Secondary | ICD-10-CM

## 2024-10-22 DIAGNOSIS — E538 Deficiency of other specified B group vitamins: Secondary | ICD-10-CM

## 2024-10-22 DIAGNOSIS — D500 Iron deficiency anemia secondary to blood loss (chronic): Secondary | ICD-10-CM

## 2024-10-22 DIAGNOSIS — E559 Vitamin D deficiency, unspecified: Secondary | ICD-10-CM

## 2024-10-22 DIAGNOSIS — E1169 Type 2 diabetes mellitus with other specified complication: Secondary | ICD-10-CM

## 2024-10-22 DIAGNOSIS — E1122 Type 2 diabetes mellitus with diabetic chronic kidney disease: Secondary | ICD-10-CM

## 2024-10-22 NOTE — Telephone Encounter (Signed)
The orders are in.

## 2024-10-22 NOTE — Telephone Encounter (Signed)
[name] notified.

## 2024-10-22 NOTE — Telephone Encounter (Signed)
 [name] sent me a message on pt. Pt is scheduled to have her CPE labs done here on 11/04/24. [name] asked if PCP can order pt's endo labs so she can get them done here too. Lorn is aware we can't do outside labs so asked if PCP can place order so pt doesn't have to drive so many times into [place] area. Endo wants a Insulin  and C-Peptide and CMet labs done with dx codes E11.22 and N18.2 for both labs. Please advise

## 2024-11-04 ENCOUNTER — Other Ambulatory Visit

## 2024-11-13 ENCOUNTER — Other Ambulatory Visit

## 2024-11-20 ENCOUNTER — Encounter: Admitting: Family Medicine

## 2025-01-08 ENCOUNTER — Ambulatory Visit (INDEPENDENT_AMBULATORY_CARE_PROVIDER_SITE_OTHER): Admitting: Vascular Surgery

## 2025-01-08 ENCOUNTER — Encounter (INDEPENDENT_AMBULATORY_CARE_PROVIDER_SITE_OTHER)

## 2025-01-11 ENCOUNTER — Ambulatory Visit: Admitting: Nurse Practitioner
